# Patient Record
Sex: Female | Born: 1935 | Race: White | Hispanic: No | Smoking: Never smoker
Health system: Southern US, Community
[De-identification: ages and names within clinical notes are randomized; demographics above are authoritative.]

## PROBLEM LIST (undated history)

## (undated) ENCOUNTER — Emergency Department (HOSPITAL_COMMUNITY): Admission: EM

## (undated) DIAGNOSIS — F32A Depression, unspecified: Secondary | ICD-10-CM

## (undated) DIAGNOSIS — F329 Major depressive disorder, single episode, unspecified: Secondary | ICD-10-CM

## (undated) DIAGNOSIS — I428 Other cardiomyopathies: Secondary | ICD-10-CM

## (undated) DIAGNOSIS — J449 Chronic obstructive pulmonary disease, unspecified: Secondary | ICD-10-CM

## (undated) DIAGNOSIS — I10 Essential (primary) hypertension: Secondary | ICD-10-CM

## (undated) DIAGNOSIS — I4891 Unspecified atrial fibrillation: Secondary | ICD-10-CM

## (undated) DIAGNOSIS — C801 Malignant (primary) neoplasm, unspecified: Secondary | ICD-10-CM

## (undated) DIAGNOSIS — G459 Transient cerebral ischemic attack, unspecified: Secondary | ICD-10-CM

## (undated) DIAGNOSIS — F419 Anxiety disorder, unspecified: Secondary | ICD-10-CM

## (undated) DIAGNOSIS — K219 Gastro-esophageal reflux disease without esophagitis: Secondary | ICD-10-CM

## (undated) DIAGNOSIS — M1990 Unspecified osteoarthritis, unspecified site: Secondary | ICD-10-CM

## (undated) HISTORY — PX: TOTAL HIP ARTHROPLASTY: SHX124

## (undated) HISTORY — PX: MASTECTOMY: SHX3

## (undated) HISTORY — PX: BREAST BIOPSY: SHX20

## (undated) HISTORY — DX: Transient cerebral ischemic attack, unspecified: G45.9

## (undated) HISTORY — PX: APPENDECTOMY: SHX54

## (undated) HISTORY — PX: ABDOMINAL HYSTERECTOMY: SHX81

## (undated) HISTORY — PX: CHOLECYSTECTOMY: SHX55

## (undated) HISTORY — PX: ROTATOR CUFF REPAIR: SHX139

## (undated) HISTORY — PX: TOTAL KNEE ARTHROPLASTY: SHX125

## (undated) HISTORY — PX: BACK SURGERY: SHX140

## (undated) HISTORY — PX: CARDIAC CATHETERIZATION: SHX172

## (undated) HISTORY — DX: Other cardiomyopathies: I42.8

## (undated) HISTORY — DX: Unspecified atrial fibrillation: I48.91

---

## 2011-03-25 DIAGNOSIS — M6281 Muscle weakness (generalized): Secondary | ICD-10-CM | POA: Diagnosis not present

## 2011-03-25 DIAGNOSIS — M25519 Pain in unspecified shoulder: Secondary | ICD-10-CM | POA: Diagnosis not present

## 2011-03-25 DIAGNOSIS — M25619 Stiffness of unspecified shoulder, not elsewhere classified: Secondary | ICD-10-CM | POA: Diagnosis not present

## 2011-03-25 DIAGNOSIS — IMO0001 Reserved for inherently not codable concepts without codable children: Secondary | ICD-10-CM | POA: Diagnosis not present

## 2011-03-28 DIAGNOSIS — M6281 Muscle weakness (generalized): Secondary | ICD-10-CM | POA: Diagnosis not present

## 2011-03-28 DIAGNOSIS — M25619 Stiffness of unspecified shoulder, not elsewhere classified: Secondary | ICD-10-CM | POA: Diagnosis not present

## 2011-03-28 DIAGNOSIS — M25519 Pain in unspecified shoulder: Secondary | ICD-10-CM | POA: Diagnosis not present

## 2011-03-28 DIAGNOSIS — IMO0001 Reserved for inherently not codable concepts without codable children: Secondary | ICD-10-CM | POA: Diagnosis not present

## 2011-04-02 DIAGNOSIS — M25619 Stiffness of unspecified shoulder, not elsewhere classified: Secondary | ICD-10-CM | POA: Diagnosis not present

## 2011-04-02 DIAGNOSIS — IMO0001 Reserved for inherently not codable concepts without codable children: Secondary | ICD-10-CM | POA: Diagnosis not present

## 2011-04-02 DIAGNOSIS — M25519 Pain in unspecified shoulder: Secondary | ICD-10-CM | POA: Diagnosis not present

## 2011-04-02 DIAGNOSIS — M6281 Muscle weakness (generalized): Secondary | ICD-10-CM | POA: Diagnosis not present

## 2011-04-04 DIAGNOSIS — IMO0001 Reserved for inherently not codable concepts without codable children: Secondary | ICD-10-CM | POA: Diagnosis not present

## 2011-04-04 DIAGNOSIS — M25519 Pain in unspecified shoulder: Secondary | ICD-10-CM | POA: Diagnosis not present

## 2011-04-04 DIAGNOSIS — M6281 Muscle weakness (generalized): Secondary | ICD-10-CM | POA: Diagnosis not present

## 2011-04-04 DIAGNOSIS — M25619 Stiffness of unspecified shoulder, not elsewhere classified: Secondary | ICD-10-CM | POA: Diagnosis not present

## 2011-04-09 DIAGNOSIS — M6281 Muscle weakness (generalized): Secondary | ICD-10-CM | POA: Diagnosis not present

## 2011-04-09 DIAGNOSIS — M25619 Stiffness of unspecified shoulder, not elsewhere classified: Secondary | ICD-10-CM | POA: Diagnosis not present

## 2011-04-09 DIAGNOSIS — M25519 Pain in unspecified shoulder: Secondary | ICD-10-CM | POA: Diagnosis not present

## 2011-04-09 DIAGNOSIS — IMO0001 Reserved for inherently not codable concepts without codable children: Secondary | ICD-10-CM | POA: Diagnosis not present

## 2011-04-11 DIAGNOSIS — IMO0001 Reserved for inherently not codable concepts without codable children: Secondary | ICD-10-CM | POA: Diagnosis not present

## 2011-04-11 DIAGNOSIS — M6281 Muscle weakness (generalized): Secondary | ICD-10-CM | POA: Diagnosis not present

## 2011-04-11 DIAGNOSIS — M25619 Stiffness of unspecified shoulder, not elsewhere classified: Secondary | ICD-10-CM | POA: Diagnosis not present

## 2011-04-11 DIAGNOSIS — M25519 Pain in unspecified shoulder: Secondary | ICD-10-CM | POA: Diagnosis not present

## 2011-04-16 DIAGNOSIS — IMO0001 Reserved for inherently not codable concepts without codable children: Secondary | ICD-10-CM | POA: Diagnosis not present

## 2011-04-16 DIAGNOSIS — M25519 Pain in unspecified shoulder: Secondary | ICD-10-CM | POA: Diagnosis not present

## 2011-04-16 DIAGNOSIS — M25619 Stiffness of unspecified shoulder, not elsewhere classified: Secondary | ICD-10-CM | POA: Diagnosis not present

## 2011-04-16 DIAGNOSIS — M6281 Muscle weakness (generalized): Secondary | ICD-10-CM | POA: Diagnosis not present

## 2011-04-18 DIAGNOSIS — M25619 Stiffness of unspecified shoulder, not elsewhere classified: Secondary | ICD-10-CM | POA: Diagnosis not present

## 2011-04-18 DIAGNOSIS — M6281 Muscle weakness (generalized): Secondary | ICD-10-CM | POA: Diagnosis not present

## 2011-04-18 DIAGNOSIS — IMO0001 Reserved for inherently not codable concepts without codable children: Secondary | ICD-10-CM | POA: Diagnosis not present

## 2011-04-18 DIAGNOSIS — M25519 Pain in unspecified shoulder: Secondary | ICD-10-CM | POA: Diagnosis not present

## 2011-04-23 DIAGNOSIS — M25519 Pain in unspecified shoulder: Secondary | ICD-10-CM | POA: Diagnosis not present

## 2011-04-23 DIAGNOSIS — M25619 Stiffness of unspecified shoulder, not elsewhere classified: Secondary | ICD-10-CM | POA: Diagnosis not present

## 2011-04-23 DIAGNOSIS — IMO0001 Reserved for inherently not codable concepts without codable children: Secondary | ICD-10-CM | POA: Diagnosis not present

## 2011-04-23 DIAGNOSIS — M6281 Muscle weakness (generalized): Secondary | ICD-10-CM | POA: Diagnosis not present

## 2011-04-25 DIAGNOSIS — M25519 Pain in unspecified shoulder: Secondary | ICD-10-CM | POA: Diagnosis not present

## 2011-04-25 DIAGNOSIS — M6281 Muscle weakness (generalized): Secondary | ICD-10-CM | POA: Diagnosis not present

## 2011-04-25 DIAGNOSIS — M25619 Stiffness of unspecified shoulder, not elsewhere classified: Secondary | ICD-10-CM | POA: Diagnosis not present

## 2011-04-25 DIAGNOSIS — IMO0001 Reserved for inherently not codable concepts without codable children: Secondary | ICD-10-CM | POA: Diagnosis not present

## 2011-04-26 DIAGNOSIS — IMO0001 Reserved for inherently not codable concepts without codable children: Secondary | ICD-10-CM | POA: Diagnosis not present

## 2011-04-26 DIAGNOSIS — M6281 Muscle weakness (generalized): Secondary | ICD-10-CM | POA: Diagnosis not present

## 2011-04-26 DIAGNOSIS — M25619 Stiffness of unspecified shoulder, not elsewhere classified: Secondary | ICD-10-CM | POA: Diagnosis not present

## 2011-04-26 DIAGNOSIS — M25519 Pain in unspecified shoulder: Secondary | ICD-10-CM | POA: Diagnosis not present

## 2011-04-30 DIAGNOSIS — F329 Major depressive disorder, single episode, unspecified: Secondary | ICD-10-CM | POA: Diagnosis not present

## 2011-04-30 DIAGNOSIS — M6281 Muscle weakness (generalized): Secondary | ICD-10-CM | POA: Diagnosis not present

## 2011-04-30 DIAGNOSIS — M25519 Pain in unspecified shoulder: Secondary | ICD-10-CM | POA: Diagnosis not present

## 2011-04-30 DIAGNOSIS — IMO0001 Reserved for inherently not codable concepts without codable children: Secondary | ICD-10-CM | POA: Diagnosis not present

## 2011-04-30 DIAGNOSIS — M25619 Stiffness of unspecified shoulder, not elsewhere classified: Secondary | ICD-10-CM | POA: Diagnosis not present

## 2011-05-01 DIAGNOSIS — M25619 Stiffness of unspecified shoulder, not elsewhere classified: Secondary | ICD-10-CM | POA: Diagnosis not present

## 2011-05-01 DIAGNOSIS — IMO0001 Reserved for inherently not codable concepts without codable children: Secondary | ICD-10-CM | POA: Diagnosis not present

## 2011-05-01 DIAGNOSIS — M25519 Pain in unspecified shoulder: Secondary | ICD-10-CM | POA: Diagnosis not present

## 2011-05-01 DIAGNOSIS — M6281 Muscle weakness (generalized): Secondary | ICD-10-CM | POA: Diagnosis not present

## 2011-05-03 DIAGNOSIS — M25619 Stiffness of unspecified shoulder, not elsewhere classified: Secondary | ICD-10-CM | POA: Diagnosis not present

## 2011-05-03 DIAGNOSIS — M25519 Pain in unspecified shoulder: Secondary | ICD-10-CM | POA: Diagnosis not present

## 2011-05-03 DIAGNOSIS — IMO0001 Reserved for inherently not codable concepts without codable children: Secondary | ICD-10-CM | POA: Diagnosis not present

## 2011-05-03 DIAGNOSIS — M6281 Muscle weakness (generalized): Secondary | ICD-10-CM | POA: Diagnosis not present

## 2011-05-06 DIAGNOSIS — M25619 Stiffness of unspecified shoulder, not elsewhere classified: Secondary | ICD-10-CM | POA: Diagnosis not present

## 2011-05-06 DIAGNOSIS — IMO0001 Reserved for inherently not codable concepts without codable children: Secondary | ICD-10-CM | POA: Diagnosis not present

## 2011-05-06 DIAGNOSIS — M25519 Pain in unspecified shoulder: Secondary | ICD-10-CM | POA: Diagnosis not present

## 2011-05-06 DIAGNOSIS — M6281 Muscle weakness (generalized): Secondary | ICD-10-CM | POA: Diagnosis not present

## 2011-05-08 DIAGNOSIS — IMO0001 Reserved for inherently not codable concepts without codable children: Secondary | ICD-10-CM | POA: Diagnosis not present

## 2011-05-08 DIAGNOSIS — M6281 Muscle weakness (generalized): Secondary | ICD-10-CM | POA: Diagnosis not present

## 2011-05-08 DIAGNOSIS — M25519 Pain in unspecified shoulder: Secondary | ICD-10-CM | POA: Diagnosis not present

## 2011-05-08 DIAGNOSIS — M25619 Stiffness of unspecified shoulder, not elsewhere classified: Secondary | ICD-10-CM | POA: Diagnosis not present

## 2011-05-10 DIAGNOSIS — M25519 Pain in unspecified shoulder: Secondary | ICD-10-CM | POA: Diagnosis not present

## 2011-05-10 DIAGNOSIS — M6281 Muscle weakness (generalized): Secondary | ICD-10-CM | POA: Diagnosis not present

## 2011-05-10 DIAGNOSIS — M25619 Stiffness of unspecified shoulder, not elsewhere classified: Secondary | ICD-10-CM | POA: Diagnosis not present

## 2011-05-10 DIAGNOSIS — IMO0001 Reserved for inherently not codable concepts without codable children: Secondary | ICD-10-CM | POA: Diagnosis not present

## 2011-05-14 DIAGNOSIS — M6281 Muscle weakness (generalized): Secondary | ICD-10-CM | POA: Diagnosis not present

## 2011-05-14 DIAGNOSIS — M25619 Stiffness of unspecified shoulder, not elsewhere classified: Secondary | ICD-10-CM | POA: Diagnosis not present

## 2011-05-14 DIAGNOSIS — IMO0001 Reserved for inherently not codable concepts without codable children: Secondary | ICD-10-CM | POA: Diagnosis not present

## 2011-05-14 DIAGNOSIS — M25519 Pain in unspecified shoulder: Secondary | ICD-10-CM | POA: Diagnosis not present

## 2011-05-15 DIAGNOSIS — M6281 Muscle weakness (generalized): Secondary | ICD-10-CM | POA: Diagnosis not present

## 2011-05-15 DIAGNOSIS — IMO0001 Reserved for inherently not codable concepts without codable children: Secondary | ICD-10-CM | POA: Diagnosis not present

## 2011-05-15 DIAGNOSIS — M25619 Stiffness of unspecified shoulder, not elsewhere classified: Secondary | ICD-10-CM | POA: Diagnosis not present

## 2011-05-15 DIAGNOSIS — M25519 Pain in unspecified shoulder: Secondary | ICD-10-CM | POA: Diagnosis not present

## 2011-05-17 DIAGNOSIS — IMO0001 Reserved for inherently not codable concepts without codable children: Secondary | ICD-10-CM | POA: Diagnosis not present

## 2011-05-17 DIAGNOSIS — M25519 Pain in unspecified shoulder: Secondary | ICD-10-CM | POA: Diagnosis not present

## 2011-05-17 DIAGNOSIS — M25619 Stiffness of unspecified shoulder, not elsewhere classified: Secondary | ICD-10-CM | POA: Diagnosis not present

## 2011-05-17 DIAGNOSIS — M6281 Muscle weakness (generalized): Secondary | ICD-10-CM | POA: Diagnosis not present

## 2011-05-20 DIAGNOSIS — M25519 Pain in unspecified shoulder: Secondary | ICD-10-CM | POA: Diagnosis not present

## 2011-05-20 DIAGNOSIS — M25619 Stiffness of unspecified shoulder, not elsewhere classified: Secondary | ICD-10-CM | POA: Diagnosis not present

## 2011-05-20 DIAGNOSIS — M6281 Muscle weakness (generalized): Secondary | ICD-10-CM | POA: Diagnosis not present

## 2011-05-20 DIAGNOSIS — IMO0001 Reserved for inherently not codable concepts without codable children: Secondary | ICD-10-CM | POA: Diagnosis not present

## 2011-05-22 DIAGNOSIS — G459 Transient cerebral ischemic attack, unspecified: Secondary | ICD-10-CM | POA: Diagnosis not present

## 2011-05-22 DIAGNOSIS — Z79899 Other long term (current) drug therapy: Secondary | ICD-10-CM | POA: Diagnosis not present

## 2011-05-22 DIAGNOSIS — M25519 Pain in unspecified shoulder: Secondary | ICD-10-CM | POA: Diagnosis not present

## 2011-05-22 DIAGNOSIS — M6281 Muscle weakness (generalized): Secondary | ICD-10-CM | POA: Diagnosis not present

## 2011-05-22 DIAGNOSIS — M25619 Stiffness of unspecified shoulder, not elsewhere classified: Secondary | ICD-10-CM | POA: Diagnosis not present

## 2011-05-22 DIAGNOSIS — IMO0001 Reserved for inherently not codable concepts without codable children: Secondary | ICD-10-CM | POA: Diagnosis not present

## 2011-05-23 DIAGNOSIS — G459 Transient cerebral ischemic attack, unspecified: Secondary | ICD-10-CM | POA: Diagnosis not present

## 2011-05-27 DIAGNOSIS — R5381 Other malaise: Secondary | ICD-10-CM | POA: Diagnosis not present

## 2011-05-27 DIAGNOSIS — I10 Essential (primary) hypertension: Secondary | ICD-10-CM | POA: Diagnosis not present

## 2011-05-27 DIAGNOSIS — G459 Transient cerebral ischemic attack, unspecified: Secondary | ICD-10-CM | POA: Diagnosis not present

## 2011-05-27 DIAGNOSIS — R0989 Other specified symptoms and signs involving the circulatory and respiratory systems: Secondary | ICD-10-CM | POA: Diagnosis not present

## 2011-05-27 DIAGNOSIS — Z853 Personal history of malignant neoplasm of breast: Secondary | ICD-10-CM | POA: Diagnosis not present

## 2011-05-27 DIAGNOSIS — M159 Polyosteoarthritis, unspecified: Secondary | ICD-10-CM | POA: Diagnosis present

## 2011-05-27 DIAGNOSIS — R0609 Other forms of dyspnea: Secondary | ICD-10-CM | POA: Diagnosis not present

## 2011-05-27 DIAGNOSIS — K219 Gastro-esophageal reflux disease without esophagitis: Secondary | ICD-10-CM | POA: Diagnosis present

## 2011-05-27 DIAGNOSIS — E785 Hyperlipidemia, unspecified: Secondary | ICD-10-CM | POA: Diagnosis not present

## 2011-05-27 DIAGNOSIS — R55 Syncope and collapse: Secondary | ICD-10-CM | POA: Diagnosis not present

## 2011-05-27 DIAGNOSIS — R209 Unspecified disturbances of skin sensation: Secondary | ICD-10-CM | POA: Diagnosis not present

## 2011-05-27 DIAGNOSIS — R42 Dizziness and giddiness: Secondary | ICD-10-CM | POA: Diagnosis not present

## 2011-05-27 DIAGNOSIS — N390 Urinary tract infection, site not specified: Secondary | ICD-10-CM | POA: Diagnosis not present

## 2011-05-27 DIAGNOSIS — R5383 Other fatigue: Secondary | ICD-10-CM | POA: Diagnosis not present

## 2011-05-27 DIAGNOSIS — F341 Dysthymic disorder: Secondary | ICD-10-CM | POA: Diagnosis present

## 2011-05-27 DIAGNOSIS — I6789 Other cerebrovascular disease: Secondary | ICD-10-CM | POA: Diagnosis not present

## 2011-05-31 DIAGNOSIS — IMO0001 Reserved for inherently not codable concepts without codable children: Secondary | ICD-10-CM | POA: Diagnosis not present

## 2011-05-31 DIAGNOSIS — M6281 Muscle weakness (generalized): Secondary | ICD-10-CM | POA: Diagnosis not present

## 2011-05-31 DIAGNOSIS — M25519 Pain in unspecified shoulder: Secondary | ICD-10-CM | POA: Diagnosis not present

## 2011-05-31 DIAGNOSIS — I10 Essential (primary) hypertension: Secondary | ICD-10-CM | POA: Diagnosis not present

## 2011-05-31 DIAGNOSIS — M25619 Stiffness of unspecified shoulder, not elsewhere classified: Secondary | ICD-10-CM | POA: Diagnosis not present

## 2011-05-31 DIAGNOSIS — I679 Cerebrovascular disease, unspecified: Secondary | ICD-10-CM | POA: Diagnosis not present

## 2011-06-04 DIAGNOSIS — M25519 Pain in unspecified shoulder: Secondary | ICD-10-CM | POA: Diagnosis not present

## 2011-06-04 DIAGNOSIS — M25619 Stiffness of unspecified shoulder, not elsewhere classified: Secondary | ICD-10-CM | POA: Diagnosis not present

## 2011-06-04 DIAGNOSIS — M6281 Muscle weakness (generalized): Secondary | ICD-10-CM | POA: Diagnosis not present

## 2011-06-04 DIAGNOSIS — IMO0001 Reserved for inherently not codable concepts without codable children: Secondary | ICD-10-CM | POA: Diagnosis not present

## 2011-06-06 DIAGNOSIS — M25519 Pain in unspecified shoulder: Secondary | ICD-10-CM | POA: Diagnosis not present

## 2011-06-06 DIAGNOSIS — IMO0001 Reserved for inherently not codable concepts without codable children: Secondary | ICD-10-CM | POA: Diagnosis not present

## 2011-06-06 DIAGNOSIS — M6281 Muscle weakness (generalized): Secondary | ICD-10-CM | POA: Diagnosis not present

## 2011-06-06 DIAGNOSIS — M25619 Stiffness of unspecified shoulder, not elsewhere classified: Secondary | ICD-10-CM | POA: Diagnosis not present

## 2011-06-11 DIAGNOSIS — M6281 Muscle weakness (generalized): Secondary | ICD-10-CM | POA: Diagnosis not present

## 2011-06-11 DIAGNOSIS — M25619 Stiffness of unspecified shoulder, not elsewhere classified: Secondary | ICD-10-CM | POA: Diagnosis not present

## 2011-06-11 DIAGNOSIS — IMO0001 Reserved for inherently not codable concepts without codable children: Secondary | ICD-10-CM | POA: Diagnosis not present

## 2011-06-11 DIAGNOSIS — M25519 Pain in unspecified shoulder: Secondary | ICD-10-CM | POA: Diagnosis not present

## 2011-06-12 DIAGNOSIS — M6281 Muscle weakness (generalized): Secondary | ICD-10-CM | POA: Diagnosis not present

## 2011-06-12 DIAGNOSIS — M25619 Stiffness of unspecified shoulder, not elsewhere classified: Secondary | ICD-10-CM | POA: Diagnosis not present

## 2011-06-12 DIAGNOSIS — IMO0001 Reserved for inherently not codable concepts without codable children: Secondary | ICD-10-CM | POA: Diagnosis not present

## 2011-06-12 DIAGNOSIS — M25519 Pain in unspecified shoulder: Secondary | ICD-10-CM | POA: Diagnosis not present

## 2011-06-14 DIAGNOSIS — M25519 Pain in unspecified shoulder: Secondary | ICD-10-CM | POA: Diagnosis not present

## 2011-06-14 DIAGNOSIS — M6281 Muscle weakness (generalized): Secondary | ICD-10-CM | POA: Diagnosis not present

## 2011-06-14 DIAGNOSIS — M25619 Stiffness of unspecified shoulder, not elsewhere classified: Secondary | ICD-10-CM | POA: Diagnosis not present

## 2011-06-14 DIAGNOSIS — IMO0001 Reserved for inherently not codable concepts without codable children: Secondary | ICD-10-CM | POA: Diagnosis not present

## 2011-06-18 DIAGNOSIS — M6281 Muscle weakness (generalized): Secondary | ICD-10-CM | POA: Diagnosis not present

## 2011-06-18 DIAGNOSIS — IMO0001 Reserved for inherently not codable concepts without codable children: Secondary | ICD-10-CM | POA: Diagnosis not present

## 2011-06-18 DIAGNOSIS — M25519 Pain in unspecified shoulder: Secondary | ICD-10-CM | POA: Diagnosis not present

## 2011-06-18 DIAGNOSIS — M25619 Stiffness of unspecified shoulder, not elsewhere classified: Secondary | ICD-10-CM | POA: Diagnosis not present

## 2011-06-19 DIAGNOSIS — M25619 Stiffness of unspecified shoulder, not elsewhere classified: Secondary | ICD-10-CM | POA: Diagnosis not present

## 2011-06-19 DIAGNOSIS — IMO0001 Reserved for inherently not codable concepts without codable children: Secondary | ICD-10-CM | POA: Diagnosis not present

## 2011-06-19 DIAGNOSIS — M6281 Muscle weakness (generalized): Secondary | ICD-10-CM | POA: Diagnosis not present

## 2011-06-19 DIAGNOSIS — M25519 Pain in unspecified shoulder: Secondary | ICD-10-CM | POA: Diagnosis not present

## 2011-06-21 DIAGNOSIS — M6281 Muscle weakness (generalized): Secondary | ICD-10-CM | POA: Diagnosis not present

## 2011-06-21 DIAGNOSIS — M25519 Pain in unspecified shoulder: Secondary | ICD-10-CM | POA: Diagnosis not present

## 2011-06-21 DIAGNOSIS — M25619 Stiffness of unspecified shoulder, not elsewhere classified: Secondary | ICD-10-CM | POA: Diagnosis not present

## 2011-06-21 DIAGNOSIS — IMO0001 Reserved for inherently not codable concepts without codable children: Secondary | ICD-10-CM | POA: Diagnosis not present

## 2011-06-24 DIAGNOSIS — R079 Chest pain, unspecified: Secondary | ICD-10-CM | POA: Diagnosis not present

## 2011-06-24 DIAGNOSIS — R0602 Shortness of breath: Secondary | ICD-10-CM | POA: Diagnosis not present

## 2011-06-25 DIAGNOSIS — M25619 Stiffness of unspecified shoulder, not elsewhere classified: Secondary | ICD-10-CM | POA: Diagnosis not present

## 2011-06-25 DIAGNOSIS — M25519 Pain in unspecified shoulder: Secondary | ICD-10-CM | POA: Diagnosis not present

## 2011-06-25 DIAGNOSIS — IMO0001 Reserved for inherently not codable concepts without codable children: Secondary | ICD-10-CM | POA: Diagnosis not present

## 2011-06-25 DIAGNOSIS — M6281 Muscle weakness (generalized): Secondary | ICD-10-CM | POA: Diagnosis not present

## 2011-06-27 DIAGNOSIS — M6281 Muscle weakness (generalized): Secondary | ICD-10-CM | POA: Diagnosis not present

## 2011-06-27 DIAGNOSIS — M25619 Stiffness of unspecified shoulder, not elsewhere classified: Secondary | ICD-10-CM | POA: Diagnosis not present

## 2011-06-27 DIAGNOSIS — M25519 Pain in unspecified shoulder: Secondary | ICD-10-CM | POA: Diagnosis not present

## 2011-06-27 DIAGNOSIS — IMO0001 Reserved for inherently not codable concepts without codable children: Secondary | ICD-10-CM | POA: Diagnosis not present

## 2011-07-30 DIAGNOSIS — I10 Essential (primary) hypertension: Secondary | ICD-10-CM | POA: Diagnosis not present

## 2011-07-30 DIAGNOSIS — I679 Cerebrovascular disease, unspecified: Secondary | ICD-10-CM | POA: Diagnosis not present

## 2011-07-30 DIAGNOSIS — Z79899 Other long term (current) drug therapy: Secondary | ICD-10-CM | POA: Diagnosis not present

## 2011-08-01 DIAGNOSIS — I679 Cerebrovascular disease, unspecified: Secondary | ICD-10-CM

## 2011-08-01 DIAGNOSIS — K219 Gastro-esophageal reflux disease without esophagitis: Secondary | ICD-10-CM | POA: Insufficient documentation

## 2011-08-01 DIAGNOSIS — F411 Generalized anxiety disorder: Secondary | ICD-10-CM | POA: Insufficient documentation

## 2011-08-01 DIAGNOSIS — Z79899 Other long term (current) drug therapy: Secondary | ICD-10-CM | POA: Insufficient documentation

## 2011-08-01 DIAGNOSIS — Z853 Personal history of malignant neoplasm of breast: Secondary | ICD-10-CM | POA: Insufficient documentation

## 2011-08-01 DIAGNOSIS — B029 Zoster without complications: Secondary | ICD-10-CM | POA: Diagnosis not present

## 2011-08-01 DIAGNOSIS — K2990 Gastroduodenitis, unspecified, without bleeding: Secondary | ICD-10-CM | POA: Insufficient documentation

## 2011-08-01 DIAGNOSIS — E785 Hyperlipidemia, unspecified: Secondary | ICD-10-CM | POA: Insufficient documentation

## 2011-08-01 DIAGNOSIS — E782 Mixed hyperlipidemia: Secondary | ICD-10-CM | POA: Insufficient documentation

## 2011-08-01 DIAGNOSIS — I739 Peripheral vascular disease, unspecified: Secondary | ICD-10-CM

## 2011-08-01 DIAGNOSIS — E559 Vitamin D deficiency, unspecified: Secondary | ICD-10-CM | POA: Insufficient documentation

## 2011-08-01 DIAGNOSIS — I10 Essential (primary) hypertension: Secondary | ICD-10-CM | POA: Insufficient documentation

## 2011-08-01 DIAGNOSIS — F419 Anxiety disorder, unspecified: Secondary | ICD-10-CM | POA: Insufficient documentation

## 2011-08-01 DIAGNOSIS — F32A Depression, unspecified: Secondary | ICD-10-CM | POA: Insufficient documentation

## 2011-08-01 DIAGNOSIS — M5030 Other cervical disc degeneration, unspecified cervical region: Secondary | ICD-10-CM | POA: Insufficient documentation

## 2011-08-12 DIAGNOSIS — M25569 Pain in unspecified knee: Secondary | ICD-10-CM | POA: Diagnosis not present

## 2011-08-20 DIAGNOSIS — L259 Unspecified contact dermatitis, unspecified cause: Secondary | ICD-10-CM | POA: Diagnosis not present

## 2011-09-05 DIAGNOSIS — Z853 Personal history of malignant neoplasm of breast: Secondary | ICD-10-CM | POA: Diagnosis not present

## 2011-09-05 DIAGNOSIS — S61409A Unspecified open wound of unspecified hand, initial encounter: Secondary | ICD-10-CM | POA: Diagnosis not present

## 2011-09-05 DIAGNOSIS — Z23 Encounter for immunization: Secondary | ICD-10-CM | POA: Diagnosis not present

## 2011-09-05 DIAGNOSIS — Z7982 Long term (current) use of aspirin: Secondary | ICD-10-CM | POA: Diagnosis not present

## 2011-09-05 DIAGNOSIS — IMO0002 Reserved for concepts with insufficient information to code with codable children: Secondary | ICD-10-CM | POA: Diagnosis not present

## 2011-09-05 DIAGNOSIS — Z79899 Other long term (current) drug therapy: Secondary | ICD-10-CM | POA: Diagnosis not present

## 2011-09-18 DIAGNOSIS — J069 Acute upper respiratory infection, unspecified: Secondary | ICD-10-CM | POA: Diagnosis not present

## 2011-09-18 DIAGNOSIS — R1084 Generalized abdominal pain: Secondary | ICD-10-CM | POA: Diagnosis not present

## 2011-10-18 DIAGNOSIS — Z79899 Other long term (current) drug therapy: Secondary | ICD-10-CM | POA: Diagnosis not present

## 2011-10-18 DIAGNOSIS — Z0000 Encounter for general adult medical examination without abnormal findings: Secondary | ICD-10-CM | POA: Diagnosis not present

## 2011-10-18 DIAGNOSIS — R5381 Other malaise: Secondary | ICD-10-CM | POA: Diagnosis not present

## 2011-10-24 DIAGNOSIS — E2839 Other primary ovarian failure: Secondary | ICD-10-CM | POA: Diagnosis not present

## 2011-10-28 DIAGNOSIS — M25569 Pain in unspecified knee: Secondary | ICD-10-CM | POA: Diagnosis not present

## 2011-11-05 DIAGNOSIS — T148XXA Other injury of unspecified body region, initial encounter: Secondary | ICD-10-CM | POA: Diagnosis not present

## 2011-11-05 DIAGNOSIS — S8000XA Contusion of unspecified knee, initial encounter: Secondary | ICD-10-CM | POA: Diagnosis not present

## 2011-11-26 DIAGNOSIS — M25569 Pain in unspecified knee: Secondary | ICD-10-CM | POA: Diagnosis not present

## 2011-12-02 DIAGNOSIS — Z23 Encounter for immunization: Secondary | ICD-10-CM | POA: Diagnosis not present

## 2011-12-10 DIAGNOSIS — M810 Age-related osteoporosis without current pathological fracture: Secondary | ICD-10-CM | POA: Diagnosis not present

## 2011-12-17 DIAGNOSIS — S8000XA Contusion of unspecified knee, initial encounter: Secondary | ICD-10-CM | POA: Diagnosis not present

## 2011-12-18 DIAGNOSIS — M810 Age-related osteoporosis without current pathological fracture: Secondary | ICD-10-CM | POA: Diagnosis not present

## 2011-12-26 DIAGNOSIS — M810 Age-related osteoporosis without current pathological fracture: Secondary | ICD-10-CM | POA: Diagnosis not present

## 2011-12-31 DIAGNOSIS — S43429A Sprain of unspecified rotator cuff capsule, initial encounter: Secondary | ICD-10-CM | POA: Diagnosis not present

## 2012-01-10 DIAGNOSIS — Z1231 Encounter for screening mammogram for malignant neoplasm of breast: Secondary | ICD-10-CM | POA: Diagnosis not present

## 2012-01-20 DIAGNOSIS — F329 Major depressive disorder, single episode, unspecified: Secondary | ICD-10-CM | POA: Diagnosis not present

## 2012-03-06 DIAGNOSIS — I10 Essential (primary) hypertension: Secondary | ICD-10-CM | POA: Diagnosis not present

## 2012-03-06 DIAGNOSIS — G4700 Insomnia, unspecified: Secondary | ICD-10-CM | POA: Diagnosis not present

## 2012-03-06 DIAGNOSIS — R109 Unspecified abdominal pain: Secondary | ICD-10-CM | POA: Diagnosis not present

## 2012-05-28 DIAGNOSIS — K219 Gastro-esophageal reflux disease without esophagitis: Secondary | ICD-10-CM | POA: Diagnosis not present

## 2012-07-14 DIAGNOSIS — M5137 Other intervertebral disc degeneration, lumbosacral region: Secondary | ICD-10-CM | POA: Diagnosis not present

## 2012-07-30 DIAGNOSIS — G4700 Insomnia, unspecified: Secondary | ICD-10-CM | POA: Diagnosis not present

## 2012-07-30 DIAGNOSIS — E785 Hyperlipidemia, unspecified: Secondary | ICD-10-CM | POA: Diagnosis not present

## 2012-09-01 DIAGNOSIS — M47817 Spondylosis without myelopathy or radiculopathy, lumbosacral region: Secondary | ICD-10-CM | POA: Diagnosis not present

## 2012-09-01 DIAGNOSIS — M48061 Spinal stenosis, lumbar region without neurogenic claudication: Secondary | ICD-10-CM | POA: Diagnosis not present

## 2012-09-03 DIAGNOSIS — Z853 Personal history of malignant neoplasm of breast: Secondary | ICD-10-CM | POA: Diagnosis not present

## 2012-09-03 DIAGNOSIS — M47817 Spondylosis without myelopathy or radiculopathy, lumbosacral region: Secondary | ICD-10-CM | POA: Diagnosis not present

## 2012-09-03 DIAGNOSIS — M5126 Other intervertebral disc displacement, lumbar region: Secondary | ICD-10-CM | POA: Diagnosis not present

## 2012-09-03 DIAGNOSIS — M5137 Other intervertebral disc degeneration, lumbosacral region: Secondary | ICD-10-CM | POA: Diagnosis not present

## 2012-09-15 DIAGNOSIS — M47817 Spondylosis without myelopathy or radiculopathy, lumbosacral region: Secondary | ICD-10-CM | POA: Diagnosis not present

## 2012-09-17 DIAGNOSIS — M47817 Spondylosis without myelopathy or radiculopathy, lumbosacral region: Secondary | ICD-10-CM | POA: Diagnosis not present

## 2012-09-17 DIAGNOSIS — M545 Low back pain: Secondary | ICD-10-CM | POA: Diagnosis not present

## 2012-09-21 DIAGNOSIS — M545 Low back pain: Secondary | ICD-10-CM | POA: Diagnosis not present

## 2012-09-21 DIAGNOSIS — M47817 Spondylosis without myelopathy or radiculopathy, lumbosacral region: Secondary | ICD-10-CM | POA: Diagnosis not present

## 2012-09-23 DIAGNOSIS — M545 Low back pain: Secondary | ICD-10-CM | POA: Diagnosis not present

## 2012-09-23 DIAGNOSIS — M47817 Spondylosis without myelopathy or radiculopathy, lumbosacral region: Secondary | ICD-10-CM | POA: Diagnosis not present

## 2012-09-28 DIAGNOSIS — M47817 Spondylosis without myelopathy or radiculopathy, lumbosacral region: Secondary | ICD-10-CM | POA: Diagnosis not present

## 2012-09-28 DIAGNOSIS — M545 Low back pain: Secondary | ICD-10-CM | POA: Diagnosis not present

## 2012-09-30 DIAGNOSIS — M545 Low back pain: Secondary | ICD-10-CM | POA: Diagnosis not present

## 2012-09-30 DIAGNOSIS — M47817 Spondylosis without myelopathy or radiculopathy, lumbosacral region: Secondary | ICD-10-CM | POA: Diagnosis not present

## 2012-10-05 DIAGNOSIS — M47817 Spondylosis without myelopathy or radiculopathy, lumbosacral region: Secondary | ICD-10-CM | POA: Diagnosis not present

## 2012-10-05 DIAGNOSIS — M545 Low back pain: Secondary | ICD-10-CM | POA: Diagnosis not present

## 2012-10-07 DIAGNOSIS — M47817 Spondylosis without myelopathy or radiculopathy, lumbosacral region: Secondary | ICD-10-CM | POA: Diagnosis not present

## 2012-10-07 DIAGNOSIS — M545 Low back pain: Secondary | ICD-10-CM | POA: Diagnosis not present

## 2012-10-12 DIAGNOSIS — M545 Low back pain: Secondary | ICD-10-CM | POA: Diagnosis not present

## 2012-10-12 DIAGNOSIS — M47817 Spondylosis without myelopathy or radiculopathy, lumbosacral region: Secondary | ICD-10-CM | POA: Diagnosis not present

## 2012-10-13 DIAGNOSIS — M5137 Other intervertebral disc degeneration, lumbosacral region: Secondary | ICD-10-CM | POA: Diagnosis not present

## 2012-10-21 DIAGNOSIS — E7800 Pure hypercholesterolemia, unspecified: Secondary | ICD-10-CM | POA: Diagnosis not present

## 2012-10-21 DIAGNOSIS — Z1212 Encounter for screening for malignant neoplasm of rectum: Secondary | ICD-10-CM | POA: Diagnosis not present

## 2012-10-21 DIAGNOSIS — Z0000 Encounter for general adult medical examination without abnormal findings: Secondary | ICD-10-CM | POA: Diagnosis not present

## 2012-10-21 DIAGNOSIS — R5381 Other malaise: Secondary | ICD-10-CM | POA: Diagnosis not present

## 2012-10-21 DIAGNOSIS — Z79899 Other long term (current) drug therapy: Secondary | ICD-10-CM | POA: Diagnosis not present

## 2012-10-27 DIAGNOSIS — M5137 Other intervertebral disc degeneration, lumbosacral region: Secondary | ICD-10-CM | POA: Diagnosis not present

## 2012-12-15 DIAGNOSIS — M47817 Spondylosis without myelopathy or radiculopathy, lumbosacral region: Secondary | ICD-10-CM | POA: Diagnosis not present

## 2012-12-25 DIAGNOSIS — Z23 Encounter for immunization: Secondary | ICD-10-CM | POA: Diagnosis not present

## 2013-01-13 DIAGNOSIS — Z9010 Acquired absence of unspecified breast and nipple: Secondary | ICD-10-CM | POA: Diagnosis not present

## 2013-01-13 DIAGNOSIS — Z1231 Encounter for screening mammogram for malignant neoplasm of breast: Secondary | ICD-10-CM | POA: Diagnosis not present

## 2013-01-19 DIAGNOSIS — L259 Unspecified contact dermatitis, unspecified cause: Secondary | ICD-10-CM | POA: Diagnosis not present

## 2013-01-19 DIAGNOSIS — H6690 Otitis media, unspecified, unspecified ear: Secondary | ICD-10-CM | POA: Diagnosis not present

## 2013-01-26 DIAGNOSIS — H6690 Otitis media, unspecified, unspecified ear: Secondary | ICD-10-CM | POA: Diagnosis not present

## 2013-01-26 DIAGNOSIS — L259 Unspecified contact dermatitis, unspecified cause: Secondary | ICD-10-CM | POA: Diagnosis not present

## 2013-02-16 DIAGNOSIS — H6120 Impacted cerumen, unspecified ear: Secondary | ICD-10-CM | POA: Diagnosis not present

## 2013-02-16 DIAGNOSIS — H9190 Unspecified hearing loss, unspecified ear: Secondary | ICD-10-CM | POA: Diagnosis not present

## 2013-04-01 DIAGNOSIS — R079 Chest pain, unspecified: Secondary | ICD-10-CM | POA: Diagnosis not present

## 2013-04-05 ENCOUNTER — Encounter: Payer: Self-pay | Admitting: Cardiovascular Disease

## 2013-04-05 DIAGNOSIS — I059 Rheumatic mitral valve disease, unspecified: Secondary | ICD-10-CM | POA: Diagnosis not present

## 2013-04-12 DIAGNOSIS — M25552 Pain in left hip: Secondary | ICD-10-CM | POA: Insufficient documentation

## 2013-04-12 DIAGNOSIS — R079 Chest pain, unspecified: Secondary | ICD-10-CM | POA: Insufficient documentation

## 2013-04-12 DIAGNOSIS — R0602 Shortness of breath: Secondary | ICD-10-CM | POA: Diagnosis not present

## 2013-04-13 DIAGNOSIS — M5137 Other intervertebral disc degeneration, lumbosacral region: Secondary | ICD-10-CM | POA: Diagnosis not present

## 2013-04-13 DIAGNOSIS — M5430 Sciatica, unspecified side: Secondary | ICD-10-CM | POA: Diagnosis not present

## 2013-04-14 DIAGNOSIS — R0602 Shortness of breath: Secondary | ICD-10-CM | POA: Diagnosis not present

## 2013-04-14 DIAGNOSIS — R079 Chest pain, unspecified: Secondary | ICD-10-CM | POA: Diagnosis not present

## 2013-05-04 DIAGNOSIS — J40 Bronchitis, not specified as acute or chronic: Secondary | ICD-10-CM | POA: Diagnosis not present

## 2013-05-04 DIAGNOSIS — R059 Cough, unspecified: Secondary | ICD-10-CM | POA: Diagnosis not present

## 2013-05-04 DIAGNOSIS — R0989 Other specified symptoms and signs involving the circulatory and respiratory systems: Secondary | ICD-10-CM | POA: Diagnosis not present

## 2013-05-04 DIAGNOSIS — R0609 Other forms of dyspnea: Secondary | ICD-10-CM | POA: Diagnosis not present

## 2013-05-04 DIAGNOSIS — J069 Acute upper respiratory infection, unspecified: Secondary | ICD-10-CM | POA: Diagnosis not present

## 2013-05-04 DIAGNOSIS — R0602 Shortness of breath: Secondary | ICD-10-CM | POA: Diagnosis not present

## 2013-05-17 DIAGNOSIS — I10 Essential (primary) hypertension: Secondary | ICD-10-CM | POA: Diagnosis not present

## 2013-05-17 DIAGNOSIS — Z880 Allergy status to penicillin: Secondary | ICD-10-CM | POA: Diagnosis not present

## 2013-05-17 DIAGNOSIS — Z79899 Other long term (current) drug therapy: Secondary | ICD-10-CM | POA: Diagnosis not present

## 2013-05-17 DIAGNOSIS — F3289 Other specified depressive episodes: Secondary | ICD-10-CM | POA: Diagnosis not present

## 2013-05-17 DIAGNOSIS — I059 Rheumatic mitral valve disease, unspecified: Secondary | ICD-10-CM | POA: Diagnosis not present

## 2013-05-17 DIAGNOSIS — Z87891 Personal history of nicotine dependence: Secondary | ICD-10-CM | POA: Diagnosis not present

## 2013-05-17 DIAGNOSIS — R0989 Other specified symptoms and signs involving the circulatory and respiratory systems: Secondary | ICD-10-CM | POA: Diagnosis not present

## 2013-05-17 DIAGNOSIS — F411 Generalized anxiety disorder: Secondary | ICD-10-CM | POA: Diagnosis not present

## 2013-05-17 DIAGNOSIS — R9431 Abnormal electrocardiogram [ECG] [EKG]: Secondary | ICD-10-CM | POA: Diagnosis not present

## 2013-05-17 DIAGNOSIS — R05 Cough: Secondary | ICD-10-CM | POA: Diagnosis not present

## 2013-05-17 DIAGNOSIS — R0609 Other forms of dyspnea: Secondary | ICD-10-CM | POA: Diagnosis not present

## 2013-05-17 DIAGNOSIS — R059 Cough, unspecified: Secondary | ICD-10-CM | POA: Diagnosis not present

## 2013-05-17 DIAGNOSIS — E785 Hyperlipidemia, unspecified: Secondary | ICD-10-CM | POA: Diagnosis not present

## 2013-05-17 DIAGNOSIS — R079 Chest pain, unspecified: Secondary | ICD-10-CM | POA: Diagnosis not present

## 2013-05-17 DIAGNOSIS — E876 Hypokalemia: Secondary | ICD-10-CM | POA: Diagnosis not present

## 2013-05-17 DIAGNOSIS — Z7982 Long term (current) use of aspirin: Secondary | ICD-10-CM | POA: Diagnosis not present

## 2013-05-17 DIAGNOSIS — Z853 Personal history of malignant neoplasm of breast: Secondary | ICD-10-CM | POA: Diagnosis not present

## 2013-05-17 DIAGNOSIS — R5381 Other malaise: Secondary | ICD-10-CM | POA: Diagnosis not present

## 2013-05-17 DIAGNOSIS — R0602 Shortness of breath: Secondary | ICD-10-CM | POA: Diagnosis not present

## 2013-05-17 DIAGNOSIS — F329 Major depressive disorder, single episode, unspecified: Secondary | ICD-10-CM | POA: Diagnosis not present

## 2013-05-17 DIAGNOSIS — Z883 Allergy status to other anti-infective agents status: Secondary | ICD-10-CM | POA: Diagnosis not present

## 2013-05-17 DIAGNOSIS — Z8249 Family history of ischemic heart disease and other diseases of the circulatory system: Secondary | ICD-10-CM | POA: Diagnosis not present

## 2013-05-18 DIAGNOSIS — I10 Essential (primary) hypertension: Secondary | ICD-10-CM | POA: Diagnosis not present

## 2013-05-25 DIAGNOSIS — Z79899 Other long term (current) drug therapy: Secondary | ICD-10-CM | POA: Diagnosis not present

## 2013-05-25 DIAGNOSIS — M810 Age-related osteoporosis without current pathological fracture: Secondary | ICD-10-CM | POA: Diagnosis not present

## 2013-05-25 DIAGNOSIS — R5383 Other fatigue: Secondary | ICD-10-CM | POA: Diagnosis not present

## 2013-05-25 DIAGNOSIS — R259 Unspecified abnormal involuntary movements: Secondary | ICD-10-CM | POA: Diagnosis not present

## 2013-05-25 DIAGNOSIS — R5381 Other malaise: Secondary | ICD-10-CM | POA: Diagnosis not present

## 2013-05-31 DIAGNOSIS — R079 Chest pain, unspecified: Secondary | ICD-10-CM | POA: Diagnosis not present

## 2013-05-31 DIAGNOSIS — R002 Palpitations: Secondary | ICD-10-CM | POA: Diagnosis not present

## 2013-06-04 DIAGNOSIS — K3189 Other diseases of stomach and duodenum: Secondary | ICD-10-CM | POA: Diagnosis not present

## 2013-06-04 DIAGNOSIS — R1013 Epigastric pain: Secondary | ICD-10-CM | POA: Diagnosis not present

## 2013-06-09 HISTORY — PX: ESOPHAGOGASTRODUODENOSCOPY: SHX1529

## 2013-06-18 DIAGNOSIS — Z823 Family history of stroke: Secondary | ICD-10-CM | POA: Diagnosis not present

## 2013-06-18 DIAGNOSIS — F411 Generalized anxiety disorder: Secondary | ICD-10-CM | POA: Diagnosis not present

## 2013-06-18 DIAGNOSIS — R1013 Epigastric pain: Secondary | ICD-10-CM | POA: Diagnosis not present

## 2013-06-18 DIAGNOSIS — E785 Hyperlipidemia, unspecified: Secondary | ICD-10-CM | POA: Diagnosis not present

## 2013-06-18 DIAGNOSIS — Z79899 Other long term (current) drug therapy: Secondary | ICD-10-CM | POA: Diagnosis not present

## 2013-06-18 DIAGNOSIS — K449 Diaphragmatic hernia without obstruction or gangrene: Secondary | ICD-10-CM | POA: Diagnosis not present

## 2013-06-18 DIAGNOSIS — Z885 Allergy status to narcotic agent status: Secondary | ICD-10-CM | POA: Diagnosis not present

## 2013-06-18 DIAGNOSIS — K219 Gastro-esophageal reflux disease without esophagitis: Secondary | ICD-10-CM | POA: Diagnosis not present

## 2013-06-18 DIAGNOSIS — Z803 Family history of malignant neoplasm of breast: Secondary | ICD-10-CM | POA: Diagnosis not present

## 2013-06-18 DIAGNOSIS — Q393 Congenital stenosis and stricture of esophagus: Secondary | ICD-10-CM | POA: Diagnosis not present

## 2013-06-18 DIAGNOSIS — Q391 Atresia of esophagus with tracheo-esophageal fistula: Secondary | ICD-10-CM | POA: Diagnosis not present

## 2013-06-18 DIAGNOSIS — I10 Essential (primary) hypertension: Secondary | ICD-10-CM | POA: Diagnosis not present

## 2013-06-18 DIAGNOSIS — Z9010 Acquired absence of unspecified breast and nipple: Secondary | ICD-10-CM | POA: Diagnosis not present

## 2013-06-18 DIAGNOSIS — R1310 Dysphagia, unspecified: Secondary | ICD-10-CM | POA: Diagnosis not present

## 2013-06-18 DIAGNOSIS — Z8489 Family history of other specified conditions: Secondary | ICD-10-CM | POA: Diagnosis not present

## 2013-06-18 DIAGNOSIS — Z880 Allergy status to penicillin: Secondary | ICD-10-CM | POA: Diagnosis not present

## 2013-06-18 DIAGNOSIS — F3289 Other specified depressive episodes: Secondary | ICD-10-CM | POA: Diagnosis not present

## 2013-06-18 DIAGNOSIS — Z96649 Presence of unspecified artificial hip joint: Secondary | ICD-10-CM | POA: Diagnosis not present

## 2013-06-18 DIAGNOSIS — F329 Major depressive disorder, single episode, unspecified: Secondary | ICD-10-CM | POA: Diagnosis not present

## 2013-06-18 DIAGNOSIS — K3189 Other diseases of stomach and duodenum: Secondary | ICD-10-CM | POA: Diagnosis not present

## 2013-06-18 DIAGNOSIS — Z806 Family history of leukemia: Secondary | ICD-10-CM | POA: Diagnosis not present

## 2013-06-18 DIAGNOSIS — Z882 Allergy status to sulfonamides status: Secondary | ICD-10-CM | POA: Diagnosis not present

## 2013-06-18 DIAGNOSIS — Z96659 Presence of unspecified artificial knee joint: Secondary | ICD-10-CM | POA: Diagnosis not present

## 2013-06-18 DIAGNOSIS — Z853 Personal history of malignant neoplasm of breast: Secondary | ICD-10-CM | POA: Diagnosis not present

## 2013-06-18 DIAGNOSIS — Z7982 Long term (current) use of aspirin: Secondary | ICD-10-CM | POA: Diagnosis not present

## 2013-06-28 DIAGNOSIS — M549 Dorsalgia, unspecified: Secondary | ICD-10-CM | POA: Diagnosis not present

## 2013-06-28 DIAGNOSIS — K219 Gastro-esophageal reflux disease without esophagitis: Secondary | ICD-10-CM | POA: Diagnosis not present

## 2013-06-28 DIAGNOSIS — E782 Mixed hyperlipidemia: Secondary | ICD-10-CM | POA: Diagnosis not present

## 2013-07-27 DIAGNOSIS — M47812 Spondylosis without myelopathy or radiculopathy, cervical region: Secondary | ICD-10-CM | POA: Diagnosis not present

## 2013-08-17 DIAGNOSIS — M47812 Spondylosis without myelopathy or radiculopathy, cervical region: Secondary | ICD-10-CM | POA: Diagnosis not present

## 2013-09-24 DIAGNOSIS — I10 Essential (primary) hypertension: Secondary | ICD-10-CM | POA: Diagnosis not present

## 2013-09-28 DIAGNOSIS — I10 Essential (primary) hypertension: Secondary | ICD-10-CM | POA: Diagnosis not present

## 2013-09-28 DIAGNOSIS — R0602 Shortness of breath: Secondary | ICD-10-CM | POA: Diagnosis not present

## 2013-09-28 DIAGNOSIS — E782 Mixed hyperlipidemia: Secondary | ICD-10-CM | POA: Diagnosis not present

## 2013-09-28 DIAGNOSIS — K219 Gastro-esophageal reflux disease without esophagitis: Secondary | ICD-10-CM | POA: Diagnosis not present

## 2013-10-27 DIAGNOSIS — L0100 Impetigo, unspecified: Secondary | ICD-10-CM | POA: Diagnosis not present

## 2013-10-27 DIAGNOSIS — L219 Seborrheic dermatitis, unspecified: Secondary | ICD-10-CM | POA: Diagnosis not present

## 2013-11-08 DIAGNOSIS — L600 Ingrowing nail: Secondary | ICD-10-CM | POA: Diagnosis not present

## 2013-11-08 DIAGNOSIS — M79609 Pain in unspecified limb: Secondary | ICD-10-CM | POA: Diagnosis not present

## 2013-11-08 DIAGNOSIS — B351 Tinea unguium: Secondary | ICD-10-CM | POA: Diagnosis not present

## 2013-11-09 ENCOUNTER — Other Ambulatory Visit: Payer: Self-pay

## 2013-11-09 DIAGNOSIS — R0602 Shortness of breath: Secondary | ICD-10-CM

## 2013-11-09 LAB — PULMONARY FUNCTION TEST
FEF 25-75 Post: 0.63 L/sec
FEF 25-75 Pre: 0.72 L/sec
FEF2575-%CHANGE-POST: -13 %
FEF2575-%PRED-PRE: 47 %
FEF2575-%Pred-Post: 41 %
FEV1-%Change-Post: -2 %
FEV1-%Pred-Post: 53 %
FEV1-%Pred-Pre: 54 %
FEV1-POST: 1.07 L
FEV1-Pre: 1.1 L
FEV1FVC-%CHANGE-POST: -6 %
FEV1FVC-%Pred-Pre: 91 %
FEV6-%CHANGE-POST: 3 %
FEV6-%PRED-POST: 65 %
FEV6-%PRED-PRE: 63 %
FEV6-PRE: 1.62 L
FEV6-Post: 1.68 L
FEV6FVC-%Change-Post: 0 %
FEV6FVC-%PRED-PRE: 105 %
FEV6FVC-%Pred-Post: 104 %
FVC-%Change-Post: 4 %
FVC-%PRED-POST: 62 %
FVC-%Pred-Pre: 60 %
FVC-POST: 1.69 L
FVC-Pre: 1.62 L
POST FEV1/FVC RATIO: 63 %
POST FEV6/FVC RATIO: 99 %
Pre FEV1/FVC ratio: 68 %
Pre FEV6/FVC Ratio: 100 %
RV % pred: 161 %
RV: 3.8 L
TLC % PRED: 102 %
TLC: 5.17 L

## 2013-11-11 ENCOUNTER — Encounter (HOSPITAL_COMMUNITY): Payer: Self-pay

## 2013-11-19 ENCOUNTER — Encounter (HOSPITAL_COMMUNITY): Payer: No Typology Code available for payment source

## 2013-11-22 ENCOUNTER — Ambulatory Visit (HOSPITAL_COMMUNITY)
Admission: RE | Admit: 2013-11-22 | Discharge: 2013-11-22 | Disposition: A | Discharge status: Home / Self Care | Payer: Medicare Other | Source: Ambulatory Visit | Attending: Internal Medicine | Admitting: Internal Medicine

## 2013-11-22 DIAGNOSIS — R0602 Shortness of breath: Secondary | ICD-10-CM | POA: Insufficient documentation

## 2013-11-22 MED ORDER — ALBUTEROL SULFATE (2.5 MG/3ML) 0.083% IN NEBU
2.5000 mg | INHALATION_SOLUTION | Freq: Once | RESPIRATORY_TRACT | Status: AC
  Administered 2013-11-22: 2.5 mg via RESPIRATORY_TRACT

## 2013-12-08 DIAGNOSIS — L219 Seborrheic dermatitis, unspecified: Secondary | ICD-10-CM | POA: Diagnosis not present

## 2013-12-26 DIAGNOSIS — Z23 Encounter for immunization: Secondary | ICD-10-CM | POA: Diagnosis not present

## 2013-12-27 DIAGNOSIS — Z23 Encounter for immunization: Secondary | ICD-10-CM | POA: Diagnosis not present

## 2013-12-29 DIAGNOSIS — J449 Chronic obstructive pulmonary disease, unspecified: Secondary | ICD-10-CM | POA: Diagnosis not present

## 2014-01-05 DIAGNOSIS — J449 Chronic obstructive pulmonary disease, unspecified: Secondary | ICD-10-CM | POA: Diagnosis not present

## 2014-01-19 DIAGNOSIS — J449 Chronic obstructive pulmonary disease, unspecified: Secondary | ICD-10-CM | POA: Diagnosis not present

## 2014-01-27 ENCOUNTER — Encounter (HOSPITAL_COMMUNITY)
Admission: RE | Admit: 2014-01-27 | Discharge: 2014-01-27 | Disposition: A | Discharge status: Home / Self Care | Payer: Medicare Other | Source: Ambulatory Visit | Attending: Internal Medicine | Admitting: Internal Medicine

## 2014-01-27 ENCOUNTER — Encounter (HOSPITAL_COMMUNITY): Payer: Self-pay

## 2014-01-27 VITALS — BP 112/58 | HR 80 | Ht 64.0 in | Wt 137.0 lb

## 2014-01-27 DIAGNOSIS — Z5189 Encounter for other specified aftercare: Secondary | ICD-10-CM | POA: Insufficient documentation

## 2014-01-27 DIAGNOSIS — J449 Chronic obstructive pulmonary disease, unspecified: Secondary | ICD-10-CM | POA: Insufficient documentation

## 2014-01-27 DIAGNOSIS — J441 Chronic obstructive pulmonary disease with (acute) exacerbation: Secondary | ICD-10-CM

## 2014-01-27 HISTORY — DX: Essential (primary) hypertension: I10

## 2014-01-27 HISTORY — DX: Malignant (primary) neoplasm, unspecified: C80.1

## 2014-01-27 NOTE — Progress Notes (Signed)
Patient referred to Pulmonary Rehab by Dr. Delphina Cahill due to COPD J 44.1.  Dr. [name] is her  PCP.  During orientation advised patient on arrival and appointment times what to wear, what to do before, during and after exercise.  Reviewed attendance and class policy.  Talked about inclement weather and class consultation policy. Patient is scheduled to start Pulmonary Rehab on Monday 01/31/14 at 2:30pm. Patient was advised to come to class 5 minutes before class starts.  He was also given instructions on meeting with the dietician and attending the Family Structure classes. Pt is eager to get started. Patient completed 5 min of 6 min walk test due to increased sob. Discussed pursed lip breathing.  Handout given to demonstrate technique.  Pt verbalized understanding.

## 2014-01-27 NOTE — Patient Instructions (Signed)
Pt has finished orientation and is scheduled to start PR on __Monday 11/23/15______ at __230pm_____. Pt has been instructed to arrive to class 15 minutes early for scheduled class. Pt has been instructed to wear comfortable clothing and shoes with rubber soles. Pt has been told to take their medications 1 hour prior to coming to class.  If the patient is not going to attend class, he/she has been instructed to call.

## 2014-01-28 NOTE — Psychosocial Assessment (Signed)
Amanda Palmer 80 y.o. female  Initial Psychosocial Assessment  Pt psychosocial assessment reveals pt lives alone. Pt is currently retired. Pt hobbies include reading. Pt reports her stress level is absent or low.  Pt does not exhibit signs of depression, although pt reports taking antidepressant medication . Pt shows fair  coping skills with positive outlook . Staff offered emotional support and reassurance. Monitor and evaluate progress toward psychosocial goal(s).  Goal(s): Improved coping skills Help patient work toward returning to meaningful activities that improve patient's QOL and are attainable with patient's lung disease Breath better  Feel better   01/28/2014 3:12 PM

## 2014-01-31 ENCOUNTER — Encounter (HOSPITAL_COMMUNITY)
Admission: RE | Admit: 2014-01-31 | Discharge: 2014-01-31 | Disposition: A | Discharge status: Home / Self Care | Payer: Medicare Other | Source: Ambulatory Visit | Attending: Internal Medicine | Admitting: Internal Medicine

## 2014-01-31 DIAGNOSIS — J449 Chronic obstructive pulmonary disease, unspecified: Secondary | ICD-10-CM | POA: Diagnosis not present

## 2014-01-31 DIAGNOSIS — Z5189 Encounter for other specified aftercare: Secondary | ICD-10-CM | POA: Diagnosis not present

## 2014-01-31 NOTE — Progress Notes (Addendum)
[facility] Pulmonary Rehabilitation Baseline Outcomes Assessment   Anthropometrics:  . Height (inches): 64 . Weight (kg): 62.1 . Grip strength was measured using a Dynamometer.  The patient's highest score was a 45.  Functional Status/Exercise Capacity: . Amanda Palmer had a resting heart rate of 80 BPM, a resting blood pressure of 112/58, and an oxygen saturation of 95 % on 0 liters of O2.  Amanda Palmer performed a 6-minute walk test on 01/27/2014.  The patient completed 600 feet in 6 minutes with 1 rest breaks.  This quantifies 1.9 METS.   Dyspnea Measures: . The [facility] is a simple and standardized method of classifying disability in patients with COPD.  The assessment correlates disability and dyspnea.  At entrance the patient scored a 3. The scale is provided below.   0= I only get breathless with strenuous exercise. 1= I get short of breath when hurrying on level ground or walking up a slight incline. 2= On level ground, I walk slower than people of the same age because of breathlessness, or have to stop for breath when walking at my own pace. 3= I stop for breath after walking 100 yards or after a few minutes on level ground. 4=I am too breathless to leave the house or I am breathless when dressing.   . The patient completed the [place] ([employer] [place]).  This questionnaire relates activities of daily living and shortness of breath.  The score ranges from 0-120, a higher score relates to severe shortness of breath during activities of daily living. The patient's score at entrance was 43.  Quality of Life: . Ferrans and Powers Quality of Life Index Pulmonary Version is used to assess the patients satisfaction in different domains of their life; health and functioning, socioeconomic, psychological/spiritual, and family. The overall score is recorded out of 30 points.  The patient's goal is to achieve an overall score of 21 or higher.   Amanda Palmer received a 24.93 at entrance.  . The Patient Health Questionnaire (PHQ-2) is a first step approach for the screening of depression.  If the patient scores positive on the PHQ-2 the patient should be further assessed with the PHQ-9.  The Patient Health Questionnaire (PHQ-9) assesses the degree of depression.  Depression is important to monitor and track in pulmonary patients due to its prevalence in the population.  If the patient advances to the PHQ-9 the goal is to score less than 4 on this assessment.  Amanda Palmer scored a 0 at entrance.  Clinical Assessment Tools: . The COPD Assessment Test (CAT) is a measurement tool to quantify how much of an impact the disease has on the patient's life.  This assessment aids the Pulmonary Rehab Team in designing the patients individualized treatment plan.  A CAT score ranges from 0-40.  A score of 10 or below indicates that COPD has a low impact on the patient's life whereas a score of 30 or higher indicates a severe impact. The patient's goal is a decrease of 1 point from entrance to discharge.  Amanda Palmer had a CAT score of 22 at entrance.  Nutrition: . The "Rate My Plate" is a dietary assessment that quantifies the balance of a patient's diet.  This tool allows the Pulmonary Rehab Team to key in on the areas of the patient's diet that needs improving.  The team can then focus their nutritional education on those areas.  If the patient scores 24-40, this means  there are many ways they can make their eating habits healthier, 41-57 states that there are some ways they can make their eating habits healthier and a score of 58-72 states that they are making many healthy choices.  The patient's goal is to achieve a score of 49 or higher on this assessment.  Amanda Palmer scored a 73 at entrance.  Oxygen Compliance: . Patient is currently on 0 liters at rest, 2 liters at night, and 0 liters for exercise.  Amanda Palmer is currently not using a cpap/bipap at night.The  patient states that they do not have barriers that keep them from using their oxygen.   Education: . Amanda Palmer will attend education classes during the course of Pulmonary Rehab.  Education classes that will be offered to the patient are Activities of Daily Living and Energy Conservation, Pursed Lip Breathing and Diaphragmatic Breathing, Nutrition, Exercise for the Pulmonary Patient, Warning Signs of Infection, Chronic Lung Disease, Advanced Directives, Medications, and Stress and Meditation.  The patient completed an assessment at the entrance of the program and will complete it again upon discharge to demonstrate the level of understanding provided by the educational classes.  This assessment includes 14 questions regarding all of the education topics above.  Amanda Palmer achieved a score of 8/14 at entrance.  Smoking Cessation: Patient currently not smoking.  Exercise:  Amanda Palmer will be provided with an individualized Home Exercise Prescription (HEP) at the entrance of the program.  The patient will be followed by the Pulmonary Exercise Physiologist throughout the program to assist with the progression of the frequency, intensity, time, and type of exercise. The patient's long-term goal is to be exercising 30-60 minutes, 3-5 days per week. At entrance, the patient was exercising 1-2 days at home.     Dr. Jamesetta So Koneswaran______________ Date_________ Time __________ Medical Director

## 2014-02-01 DIAGNOSIS — Z9011 Acquired absence of right breast and nipple: Secondary | ICD-10-CM | POA: Diagnosis not present

## 2014-02-01 DIAGNOSIS — Z1231 Encounter for screening mammogram for malignant neoplasm of breast: Secondary | ICD-10-CM | POA: Diagnosis not present

## 2014-02-02 ENCOUNTER — Encounter (HOSPITAL_COMMUNITY)
Admission: RE | Admit: 2014-02-02 | Discharge: 2014-02-02 | Disposition: A | Discharge status: Home / Self Care | Payer: Medicare Other | Source: Ambulatory Visit | Attending: Internal Medicine | Admitting: Internal Medicine

## 2014-02-02 DIAGNOSIS — Z5189 Encounter for other specified aftercare: Secondary | ICD-10-CM | POA: Diagnosis not present

## 2014-02-07 ENCOUNTER — Encounter (HOSPITAL_COMMUNITY): Payer: Medicare Other

## 2014-02-07 DIAGNOSIS — H903 Sensorineural hearing loss, bilateral: Secondary | ICD-10-CM | POA: Diagnosis not present

## 2014-02-07 DIAGNOSIS — S0991XA Unspecified injury of ear, initial encounter: Secondary | ICD-10-CM | POA: Diagnosis not present

## 2014-02-09 ENCOUNTER — Encounter (HOSPITAL_COMMUNITY)
Admission: RE | Admit: 2014-02-09 | Discharge: 2014-02-09 | Disposition: A | Discharge status: Home / Self Care | Payer: Medicare Other | Source: Ambulatory Visit | Attending: Internal Medicine | Admitting: Internal Medicine

## 2014-02-09 DIAGNOSIS — Z5189 Encounter for other specified aftercare: Secondary | ICD-10-CM | POA: Insufficient documentation

## 2014-02-09 DIAGNOSIS — J449 Chronic obstructive pulmonary disease, unspecified: Secondary | ICD-10-CM | POA: Insufficient documentation

## 2014-02-14 ENCOUNTER — Encounter (HOSPITAL_COMMUNITY)
Admission: RE | Admit: 2014-02-14 | Discharge: 2014-02-14 | Disposition: A | Discharge status: Home / Self Care | Payer: Medicare Other | Source: Ambulatory Visit | Attending: Internal Medicine | Admitting: Internal Medicine

## 2014-02-14 DIAGNOSIS — Z5189 Encounter for other specified aftercare: Secondary | ICD-10-CM | POA: Diagnosis not present

## 2014-02-15 NOTE — Addendum Note (Signed)
Encounter addended by: Gean Maidens on: 02/15/2014 12:09 PM<BR>     Documentation filed: Notes Section

## 2014-02-16 ENCOUNTER — Encounter (HOSPITAL_COMMUNITY)
Admission: RE | Admit: 2014-02-16 | Discharge: 2014-02-16 | Disposition: A | Discharge status: Home / Self Care | Payer: Medicare Other | Source: Ambulatory Visit | Attending: Internal Medicine | Admitting: Internal Medicine

## 2014-02-16 DIAGNOSIS — Z5189 Encounter for other specified aftercare: Secondary | ICD-10-CM | POA: Diagnosis not present

## 2014-02-21 ENCOUNTER — Encounter (HOSPITAL_COMMUNITY)
Admission: RE | Admit: 2014-02-21 | Discharge: 2014-02-21 | Disposition: A | Discharge status: Home / Self Care | Payer: Medicare Other | Source: Ambulatory Visit | Attending: Internal Medicine | Admitting: Internal Medicine

## 2014-02-21 DIAGNOSIS — Z5189 Encounter for other specified aftercare: Secondary | ICD-10-CM | POA: Diagnosis not present

## 2014-02-23 ENCOUNTER — Encounter (HOSPITAL_COMMUNITY)
Admission: RE | Admit: 2014-02-23 | Discharge: 2014-02-23 | Disposition: A | Discharge status: Home / Self Care | Payer: Medicare Other | Source: Ambulatory Visit | Attending: Internal Medicine | Admitting: Internal Medicine

## 2014-02-23 DIAGNOSIS — Z5189 Encounter for other specified aftercare: Secondary | ICD-10-CM | POA: Diagnosis not present

## 2014-02-24 DIAGNOSIS — L03032 Cellulitis of left toe: Secondary | ICD-10-CM | POA: Diagnosis not present

## 2014-02-24 DIAGNOSIS — M79675 Pain in left toe(s): Secondary | ICD-10-CM | POA: Diagnosis not present

## 2014-02-24 DIAGNOSIS — L600 Ingrowing nail: Secondary | ICD-10-CM | POA: Diagnosis not present

## 2014-02-24 NOTE — Psychosocial Assessment (Signed)
Amanda Palmer 80 y.o. female  30 day Psychosocial Note  Patient psychosocial assessment reveals no barriers to participation in Pulmonary Rehab. There are no psychosocial areas that are currently affecting patient's rehab experience. Patient does continue to exhibit positive coping skills to deal with her psychosocial concerns. Offered emotional support and reassurance. Patient does feel*she is making progress toward Pulmonary Rehab goals. Patient reports her health and activity level has improved in the past 30 days as evidenced by patient's report of increased ability to carry longer conversations without getting short of breath and do more daily activities. Patient states family/friends have noticed changes in her activity or mood. Patient reports feeling positive about current and projected progression in Pulmonary Rehab. After reviewing the patient's treatment plan, the patient is making progress toward Pulmonary Rehab goals. Patient's rate of progress toward rehab goals is excellent. Plan of action to help patient continue to work towards rehab goals include staff encouragement. Will continue to monitor and evaluate progress toward psychosocial goal(s).  Goal(s) in progress: Help patient work toward returning to meaningful activities that improve patient's QOL and are attainable with patient's lung disease Breath better Feel better

## 2014-02-28 ENCOUNTER — Encounter (HOSPITAL_COMMUNITY)
Admission: RE | Admit: 2014-02-28 | Discharge: 2014-02-28 | Disposition: A | Discharge status: Home / Self Care | Payer: Medicare Other | Source: Ambulatory Visit | Attending: Internal Medicine | Admitting: Internal Medicine

## 2014-02-28 DIAGNOSIS — Z5189 Encounter for other specified aftercare: Secondary | ICD-10-CM | POA: Diagnosis not present

## 2014-03-02 ENCOUNTER — Encounter (HOSPITAL_COMMUNITY)
Admission: RE | Admit: 2014-03-02 | Discharge: 2014-03-02 | Disposition: A | Discharge status: Home / Self Care | Payer: Medicare Other | Source: Ambulatory Visit | Attending: Internal Medicine | Admitting: Internal Medicine

## 2014-03-02 DIAGNOSIS — Z5189 Encounter for other specified aftercare: Secondary | ICD-10-CM | POA: Diagnosis not present

## 2014-03-07 ENCOUNTER — Encounter (HOSPITAL_COMMUNITY): Payer: Medicare Other

## 2014-03-08 ENCOUNTER — Encounter (HOSPITAL_COMMUNITY)
Admission: RE | Admit: 2014-03-08 | Discharge: 2014-03-08 | Disposition: A | Discharge status: Home / Self Care | Payer: Medicare Other | Source: Ambulatory Visit | Attending: Internal Medicine | Admitting: Internal Medicine

## 2014-03-08 DIAGNOSIS — Z5189 Encounter for other specified aftercare: Secondary | ICD-10-CM | POA: Diagnosis not present

## 2014-03-09 ENCOUNTER — Encounter (HOSPITAL_COMMUNITY): Payer: Medicare Other

## 2014-03-10 ENCOUNTER — Encounter (HOSPITAL_COMMUNITY): Payer: Medicare Other

## 2014-03-10 DIAGNOSIS — L600 Ingrowing nail: Secondary | ICD-10-CM | POA: Diagnosis not present

## 2014-03-10 DIAGNOSIS — L03032 Cellulitis of left toe: Secondary | ICD-10-CM | POA: Diagnosis not present

## 2014-03-10 DIAGNOSIS — M79675 Pain in left toe(s): Secondary | ICD-10-CM | POA: Diagnosis not present

## 2014-03-14 ENCOUNTER — Encounter (HOSPITAL_COMMUNITY): Payer: Medicare Other

## 2014-03-15 ENCOUNTER — Encounter (HOSPITAL_COMMUNITY)
Admission: RE | Admit: 2014-03-15 | Discharge: 2014-03-15 | Disposition: A | Discharge status: Home / Self Care | Payer: Medicare Other | Source: Ambulatory Visit | Attending: Internal Medicine | Admitting: Internal Medicine

## 2014-03-15 DIAGNOSIS — J449 Chronic obstructive pulmonary disease, unspecified: Secondary | ICD-10-CM | POA: Diagnosis not present

## 2014-03-15 DIAGNOSIS — Z5189 Encounter for other specified aftercare: Secondary | ICD-10-CM | POA: Insufficient documentation

## 2014-03-16 ENCOUNTER — Encounter (HOSPITAL_COMMUNITY): Payer: Medicare Other

## 2014-03-17 ENCOUNTER — Encounter (HOSPITAL_COMMUNITY): Payer: Medicare Other

## 2014-03-18 DIAGNOSIS — R531 Weakness: Secondary | ICD-10-CM | POA: Diagnosis not present

## 2014-03-18 DIAGNOSIS — M839 Adult osteomalacia, unspecified: Secondary | ICD-10-CM | POA: Diagnosis not present

## 2014-03-18 DIAGNOSIS — R5383 Other fatigue: Secondary | ICD-10-CM | POA: Diagnosis not present

## 2014-03-18 DIAGNOSIS — I499 Cardiac arrhythmia, unspecified: Secondary | ICD-10-CM | POA: Diagnosis not present

## 2014-03-18 DIAGNOSIS — Z79899 Other long term (current) drug therapy: Secondary | ICD-10-CM | POA: Diagnosis not present

## 2014-03-18 DIAGNOSIS — M791 Myalgia: Secondary | ICD-10-CM | POA: Diagnosis not present

## 2014-03-21 ENCOUNTER — Encounter (HOSPITAL_COMMUNITY): Payer: Medicare Other

## 2014-03-22 ENCOUNTER — Encounter (HOSPITAL_COMMUNITY)
Admission: RE | Admit: 2014-03-22 | Discharge: 2014-03-22 | Disposition: A | Discharge status: Home / Self Care | Payer: Medicare Other | Source: Ambulatory Visit | Attending: Internal Medicine | Admitting: Internal Medicine

## 2014-03-22 DIAGNOSIS — Z5189 Encounter for other specified aftercare: Secondary | ICD-10-CM | POA: Diagnosis not present

## 2014-03-22 DIAGNOSIS — J449 Chronic obstructive pulmonary disease, unspecified: Secondary | ICD-10-CM | POA: Diagnosis not present

## 2014-03-23 ENCOUNTER — Encounter (HOSPITAL_COMMUNITY): Payer: Medicare Other

## 2014-03-24 ENCOUNTER — Encounter (HOSPITAL_COMMUNITY)
Admission: RE | Admit: 2014-03-24 | Discharge: 2014-03-24 | Disposition: A | Discharge status: Home / Self Care | Payer: Medicare Other | Source: Ambulatory Visit | Attending: Internal Medicine | Admitting: Internal Medicine

## 2014-03-24 DIAGNOSIS — Z5189 Encounter for other specified aftercare: Secondary | ICD-10-CM | POA: Diagnosis not present

## 2014-03-24 DIAGNOSIS — J449 Chronic obstructive pulmonary disease, unspecified: Secondary | ICD-10-CM | POA: Diagnosis not present

## 2014-03-24 NOTE — Psychosocial Assessment (Signed)
[name] 81 y.o. female  60 day Psychosocial Note  Patient psychosocial assessment reveals no barriers to participation in Pulmonary Rehab. Patient does continue to exhibit positive coping skills to deal with her psychosocial concerns. Offered emotional support and reassurance. Patient does feel she is making progress toward Pulmonary Rehab goals. Patient reports her health and activity level has improved in the past 30 days as evidenced by patient's report of increased ability to walk longer distances. Patient states family/friends have noticed changes in her activity or mood. Patient reports feeling positive about current and projected progression in Pulmonary Rehab. After reviewing the patient's treatment plan, the patient is making progress toward Pulmonary Rehab goals. Patient's rate of progress toward rehab goals is excellent. Plan of action to help patient continue to work towards rehab goals include staff encouragement and support. Will continue to monitor and evaluate progress toward psychosocial goal(s).  Goal(s) in progress: Help patient work toward returning to meaningful activities that improve patient's QOL and are attainable with patient's lung disease Breath better Feel better

## 2014-03-24 NOTE — Progress Notes (Signed)
Pulmonary Rehabilitation Program Outcomes Report   Orientation:  01/27/14 Graduate Date:  tbd Discharge Date:  tbd # of sessions completed: 12  Pulmonologist: None Family MD:  Dr. Delphina Cahill Class Time:  1330  A.  Exercise Program:  Tolerates exercise @ 3.39 METS for 30 minutes  B.  Mental Health:  Good mental attitude  C.  Education/Instruction/Skills  Accurately checks own pulse.  Rest:  83  Exercise:  102, Knows THR for exercise and Uses Perceived Exertion Scale and/or Dyspnea Scale  Demonstrates accurate pursed lip breathing  D.  Nutrition/Weight Control/Body Composition:  Adherence to prescribed nutrition program: good    E.  Blood Lipids   No results found for: CHOL, HDL, LDLCALC, LDLDIRECT, TRIG, CHOLHDL  F.  Lifestyle Changes:  Making positive lifestyle changes  G.  Symptoms noted with exercise:  Asymptomatic  Report Completed By:  Felicity Coyer, RN   Comments:  Halfway note.

## 2014-03-28 ENCOUNTER — Encounter (HOSPITAL_COMMUNITY): Payer: Medicare Other

## 2014-03-29 ENCOUNTER — Encounter (HOSPITAL_COMMUNITY)
Admission: RE | Admit: 2014-03-29 | Discharge: 2014-03-29 | Disposition: A | Discharge status: Home / Self Care | Payer: Medicare Other | Source: Ambulatory Visit | Attending: Internal Medicine | Admitting: Internal Medicine

## 2014-03-29 DIAGNOSIS — R7301 Impaired fasting glucose: Secondary | ICD-10-CM | POA: Diagnosis not present

## 2014-03-29 DIAGNOSIS — J449 Chronic obstructive pulmonary disease, unspecified: Secondary | ICD-10-CM | POA: Diagnosis not present

## 2014-03-29 DIAGNOSIS — E782 Mixed hyperlipidemia: Secondary | ICD-10-CM | POA: Diagnosis not present

## 2014-03-29 DIAGNOSIS — Z5189 Encounter for other specified aftercare: Secondary | ICD-10-CM | POA: Diagnosis not present

## 2014-03-30 ENCOUNTER — Encounter (HOSPITAL_COMMUNITY): Payer: Medicare Other

## 2014-03-31 ENCOUNTER — Encounter (HOSPITAL_COMMUNITY)
Admission: RE | Admit: 2014-03-31 | Discharge: 2014-03-31 | Disposition: A | Discharge status: Home / Self Care | Payer: Medicare Other | Source: Ambulatory Visit | Attending: Internal Medicine | Admitting: Internal Medicine

## 2014-03-31 DIAGNOSIS — Z5189 Encounter for other specified aftercare: Secondary | ICD-10-CM | POA: Diagnosis not present

## 2014-03-31 DIAGNOSIS — J449 Chronic obstructive pulmonary disease, unspecified: Secondary | ICD-10-CM | POA: Diagnosis not present

## 2014-04-04 ENCOUNTER — Encounter (HOSPITAL_COMMUNITY): Payer: Medicare Other

## 2014-04-05 ENCOUNTER — Encounter (HOSPITAL_COMMUNITY)
Admission: RE | Admit: 2014-04-05 | Discharge: 2014-04-05 | Disposition: A | Discharge status: Home / Self Care | Payer: Medicare Other | Source: Ambulatory Visit | Attending: Internal Medicine | Admitting: Internal Medicine

## 2014-04-05 DIAGNOSIS — J449 Chronic obstructive pulmonary disease, unspecified: Secondary | ICD-10-CM | POA: Diagnosis not present

## 2014-04-05 DIAGNOSIS — Z5189 Encounter for other specified aftercare: Secondary | ICD-10-CM | POA: Diagnosis not present

## 2014-04-06 ENCOUNTER — Encounter (HOSPITAL_COMMUNITY): Payer: Medicare Other

## 2014-04-06 DIAGNOSIS — E782 Mixed hyperlipidemia: Secondary | ICD-10-CM | POA: Diagnosis not present

## 2014-04-06 DIAGNOSIS — D519 Vitamin B12 deficiency anemia, unspecified: Secondary | ICD-10-CM | POA: Diagnosis not present

## 2014-04-06 DIAGNOSIS — J449 Chronic obstructive pulmonary disease, unspecified: Secondary | ICD-10-CM | POA: Diagnosis not present

## 2014-04-06 DIAGNOSIS — I10 Essential (primary) hypertension: Secondary | ICD-10-CM | POA: Diagnosis not present

## 2014-04-07 ENCOUNTER — Encounter (HOSPITAL_COMMUNITY)
Admission: RE | Admit: 2014-04-07 | Discharge: 2014-04-07 | Disposition: A | Discharge status: Home / Self Care | Payer: Medicare Other | Source: Ambulatory Visit | Attending: Internal Medicine | Admitting: Internal Medicine

## 2014-04-07 DIAGNOSIS — J449 Chronic obstructive pulmonary disease, unspecified: Secondary | ICD-10-CM | POA: Diagnosis not present

## 2014-04-07 DIAGNOSIS — Z5189 Encounter for other specified aftercare: Secondary | ICD-10-CM | POA: Diagnosis not present

## 2014-04-11 ENCOUNTER — Encounter (HOSPITAL_COMMUNITY): Payer: Medicare Other

## 2014-04-11 DIAGNOSIS — L219 Seborrheic dermatitis, unspecified: Secondary | ICD-10-CM | POA: Diagnosis not present

## 2014-04-12 ENCOUNTER — Encounter (HOSPITAL_COMMUNITY)
Admission: RE | Admit: 2014-04-12 | Discharge: 2014-04-12 | Disposition: A | Discharge status: Home / Self Care | Payer: Medicare Other | Source: Ambulatory Visit | Attending: Internal Medicine | Admitting: Internal Medicine

## 2014-04-12 DIAGNOSIS — Z5189 Encounter for other specified aftercare: Secondary | ICD-10-CM | POA: Insufficient documentation

## 2014-04-12 DIAGNOSIS — J449 Chronic obstructive pulmonary disease, unspecified: Secondary | ICD-10-CM | POA: Insufficient documentation

## 2014-04-13 ENCOUNTER — Encounter (HOSPITAL_COMMUNITY): Payer: Medicare Other

## 2014-04-14 ENCOUNTER — Encounter (HOSPITAL_COMMUNITY)
Admission: RE | Admit: 2014-04-14 | Discharge: 2014-04-14 | Disposition: A | Discharge status: Home / Self Care | Payer: Medicare Other | Source: Ambulatory Visit | Attending: Internal Medicine | Admitting: Internal Medicine

## 2014-04-14 DIAGNOSIS — Z5189 Encounter for other specified aftercare: Secondary | ICD-10-CM | POA: Diagnosis not present

## 2014-04-14 DIAGNOSIS — J449 Chronic obstructive pulmonary disease, unspecified: Secondary | ICD-10-CM | POA: Diagnosis not present

## 2014-04-14 NOTE — Psychosocial Assessment (Signed)
Amanda Palmer 81 y.o. female  38 day Psychosocial Note  Patient psychosocial assessment reveals no barriers to participation in Pulmonary Rehab. Patient does continue to exhibit positive coping skills to deal with her psychosocial concerns. Offered emotional support and reassurance. Patient does feel she is making progress toward Pulmonary Rehab goals. Patient reports her health and activity level has improved in the past 30 days as evidenced by patient's report of increased energy level and feeling better. Patient states family/friends have noticed changes in her activity or mood. Patient reports feeling positive about current and projected progression in Pulmonary Rehab. After reviewing the patient's treatment plan, the patient is making progress toward Pulmonary Rehab goals. Patient's rate of progress toward rehab goals is excellent. Plan of action to help patient continue to work towards rehab goals include staff encouragement and support. Will continue to monitor and evaluate progress toward psychosocial goal(s).  Goal(s) in progress: Help patient work toward returning to meaningful activities that improve patient's QOL and are attainable with patient's lung disease Breath better Feel better

## 2014-04-18 ENCOUNTER — Encounter (HOSPITAL_COMMUNITY): Payer: Medicare Other

## 2014-04-19 ENCOUNTER — Encounter (HOSPITAL_COMMUNITY)
Admission: RE | Admit: 2014-04-19 | Discharge: 2014-04-19 | Disposition: A | Discharge status: Home / Self Care | Payer: Medicare Other | Source: Ambulatory Visit | Attending: Internal Medicine | Admitting: Internal Medicine

## 2014-04-19 DIAGNOSIS — Z5189 Encounter for other specified aftercare: Secondary | ICD-10-CM | POA: Diagnosis not present

## 2014-04-19 DIAGNOSIS — J449 Chronic obstructive pulmonary disease, unspecified: Secondary | ICD-10-CM | POA: Diagnosis not present

## 2014-04-20 ENCOUNTER — Encounter (HOSPITAL_COMMUNITY): Payer: Medicare Other

## 2014-04-21 ENCOUNTER — Encounter (HOSPITAL_COMMUNITY)
Admission: RE | Admit: 2014-04-21 | Discharge: 2014-04-21 | Disposition: A | Discharge status: Home / Self Care | Payer: Medicare Other | Source: Ambulatory Visit | Attending: Internal Medicine | Admitting: Internal Medicine

## 2014-04-21 DIAGNOSIS — J449 Chronic obstructive pulmonary disease, unspecified: Secondary | ICD-10-CM | POA: Diagnosis not present

## 2014-04-21 DIAGNOSIS — Z5189 Encounter for other specified aftercare: Secondary | ICD-10-CM | POA: Diagnosis not present

## 2014-04-26 ENCOUNTER — Encounter (HOSPITAL_COMMUNITY)
Admission: RE | Admit: 2014-04-26 | Discharge: 2014-04-26 | Disposition: A | Discharge status: Home / Self Care | Payer: Medicare Other | Source: Ambulatory Visit | Attending: Internal Medicine | Admitting: Internal Medicine

## 2014-04-26 DIAGNOSIS — Z5189 Encounter for other specified aftercare: Secondary | ICD-10-CM | POA: Diagnosis not present

## 2014-04-26 DIAGNOSIS — J449 Chronic obstructive pulmonary disease, unspecified: Secondary | ICD-10-CM | POA: Diagnosis not present

## 2014-04-28 ENCOUNTER — Encounter (HOSPITAL_COMMUNITY)
Admission: RE | Admit: 2014-04-28 | Discharge: 2014-04-28 | Disposition: A | Discharge status: Home / Self Care | Payer: Medicare Other | Source: Ambulatory Visit | Attending: Internal Medicine | Admitting: Internal Medicine

## 2014-04-28 DIAGNOSIS — Z5189 Encounter for other specified aftercare: Secondary | ICD-10-CM | POA: Diagnosis not present

## 2014-04-28 DIAGNOSIS — J449 Chronic obstructive pulmonary disease, unspecified: Secondary | ICD-10-CM | POA: Diagnosis not present

## 2014-05-03 ENCOUNTER — Encounter (HOSPITAL_COMMUNITY)
Admission: RE | Admit: 2014-05-03 | Discharge: 2014-05-03 | Disposition: A | Discharge status: Home / Self Care | Payer: Medicare Other | Source: Ambulatory Visit | Attending: Internal Medicine | Admitting: Internal Medicine

## 2014-05-03 DIAGNOSIS — Z5189 Encounter for other specified aftercare: Secondary | ICD-10-CM | POA: Diagnosis not present

## 2014-05-03 DIAGNOSIS — J449 Chronic obstructive pulmonary disease, unspecified: Secondary | ICD-10-CM | POA: Diagnosis not present

## 2014-05-05 NOTE — Progress Notes (Signed)
[facility]                                                             Final/Discharge Outcome Results  Anthropometrics: . Height (inches): 64 . Weight (kg): 65.2 ? This is a change of 3.1 from entrance. . Grip strength was measured using a Dynamometer.  Amanda patient's discharge score was a 48.7.   ? This is a change of 8.2% from entrance.  Functional Status/Exercise Capacity: . Amanda Palmer had a resting heart rate of 101 BPM, a resting blood pressure of 112/60, and an oxygen saturation of 95% on 0 liters of O2.  Amanda Palmer performed a discharge 6-minute walk test on 04/28/14.  Amanda patient completed 900 feet in 6 minutes with 0 rest breaks.  This quantifies 2.3 METS. Amanda patient's goal is to add 82 feet onto Amanda baseline 6MWT.   ? Amanda patient increased their 6-minute walk test distance by 300 feet and their MET level by 0.4 METs.  Dyspnea Measures: . Amanda Palmer is a simple and standardized method of classifying disability in patients with COPD.  Amanda assessment correlates disability and dyspnea.  Upon discharge Amanda patients resting score was 3. Amanda scale is provided below.  ? This is a change of 0 from entrance.  0= I only get breathless with strenuous exercise. 1= I get short of breath when hurrying on level ground or walking up a slight incline. 2= On level ground, I walk slower than people of Amanda same age because of breathlessness, or have to stop for breath when walking at my own pace. 3= I stop for breath after walking 100 yards or after a few minutes on level ground. 4=I am too breathless to leave Amanda house or I am breathless when dressing.   . Amanda patient completed Amanda [place] ([employer] [place]).  This questionnaire relates activities of daily living and shortness of breath.  Amanda score ranges from 0-120, a higher score relates to severe shortness of breath during activities  of daily living. Amanda patient's score at discharge was 56. ? This is a change of 30.2% from entrance.  Quality of Life: . Amanda Palmer is used to assess Amanda patients satisfaction in different domains of their life; health and functioning, socioeconomic, psychological/spiritual, and family. Amanda overall score is recorded out of 30 points.  Amanda patient's goal is to achieve an overall score of 21 or higher.  Amanda Palmer received a 23.89 upon discharge.  ? This is a change of -4.13% from entrance.  . Amanda Patient Health Questionnaire (PHQ-2) is a first step approach for Amanda screening of depression.  If Amanda patient scores positive on Amanda PHQ-2 Amanda patient should be further assessed with Amanda PHQ-9.  Amanda Patient Health Questionnaire (PHQ-9) assesses Amanda degree of depression.  Depression is important to monitor and track in pulmonary patients due to its prevalence in Amanda population.  If Amanda patient advances to Amanda PHQ-9 Amanda goal is to score less than 4 on this assessment.  Amanda Palmer scored a 0 at discharge. ? This is a change of 0 from entrance.   Clinical Assessment Tools: . Amanda COPD Assessment Test (CAT) is a measurement tool  to quantify how much of an impact Amanda disease has on Amanda patient's life.  This assessment aids Amanda Pulmonary Rehab Team in designing Amanda patients individualized treatment plan.  A CAT score ranges from 0-40.  A score of 10 or below indicates that COPD has a low impact on Amanda patient's life whereas a score of 30 or higher indicates a severe impact. Amanda patient's goal is a decrease of 1 point from entrance to discharge.  Amanda Palmer had a CAT score of 17 upon discharge.   ? This is a change of -22.7% from entrance.  Nutrition: . Amanda "Rate My Plate" is a dietary assessment that quantifies Amanda balance of a patient's diet.  This tool allows Amanda Pulmonary Rehab Team to key in on Amanda areas of Amanda patient's diet that needs improving.  Amanda team can then  focus their nutritional education on those areas.  If Amanda patient scores 24-40, this means there are many ways they can make their eating habits healthier, 41-57 states that there are some ways they can make their eating habits healthier and a score of 58-72 states that they are making many healthy choices.  Amanda patient's goal is to achieve a score of 49 or higher on this assessment.  Amanda Palmer scored a 42 upon discharge.   ? This is a change of -17.6% from entrance.  Oxygen Compliance: . Patient is currently on 0 liters at rest, 2 liters at night, and 0 liters for exercise.  Amanda Palmer is not currently using cpap/bipap at night. Amanda patient states that they do not have barriers that keep them from using their oxygen.   ? This is a no change result from entrance.    Education: . Amanda Palmer attended 13/13 education classes.  Amanda Palmer completed a discharge educational assessment and achieved a score of 6/14.  ? This is a change of -24.6% from entrance.   Smoking Cessation:  N/A  Exercise: . Amanda Palmer was provided with an individualized Home Exercise Prescription (HEP) at Amanda entrance of Amanda program.  Amanda patient's goal is to be exercising 30-60 minutes, 3-5 days per week. Upon discharge Amanda patient is exercising 1-2 days at home.  This is a change of 0 from entrance.  After graduation from Pulmonary Rehab, Amanda Palmer will continue exercising at apartment gym.

## 2014-05-05 NOTE — Progress Notes (Signed)
Patient is discharged from [facility] and Pulmonary program today, May 03, 2014 with 24 sessions.  She achieved LTG of 30 minutes of aerobic exercise at max met level of 3.40 .  All patient vitals are WNL.  Patient has met with dietician.  Discharge instructions have been reviewed in detail and patient expressed an understanding of material given.  Patient plans to exercise at home at her apartment gym.  Pulmonary Rehab will make 1 month, 6 month and 1 year call backs.  Patient had no complaints of any abnormal S/S or pain on their exit visit.

## 2014-05-13 ENCOUNTER — Encounter: Payer: Self-pay | Admitting: *Deleted

## 2014-05-17 ENCOUNTER — Ambulatory Visit (INDEPENDENT_AMBULATORY_CARE_PROVIDER_SITE_OTHER): Payer: Medicare Other | Admitting: Cardiovascular Disease

## 2014-05-17 ENCOUNTER — Encounter: Payer: Self-pay | Admitting: Cardiovascular Disease

## 2014-05-17 VITALS — BP 130/60 | HR 85 | Ht 64.0 in | Wt 144.0 lb

## 2014-05-17 DIAGNOSIS — I10 Essential (primary) hypertension: Secondary | ICD-10-CM

## 2014-05-17 DIAGNOSIS — I499 Cardiac arrhythmia, unspecified: Secondary | ICD-10-CM

## 2014-05-17 DIAGNOSIS — I4891 Unspecified atrial fibrillation: Secondary | ICD-10-CM | POA: Diagnosis not present

## 2014-05-17 DIAGNOSIS — I498 Other specified cardiac arrhythmias: Secondary | ICD-10-CM

## 2014-05-17 DIAGNOSIS — E785 Hyperlipidemia, unspecified: Secondary | ICD-10-CM

## 2014-05-17 DIAGNOSIS — J438 Other emphysema: Secondary | ICD-10-CM | POA: Diagnosis not present

## 2014-05-17 NOTE — Progress Notes (Signed)
Patient ID: Amanda Palmer, female   DOB: 12-09-1933, 81 y.o.   MRN: [id number]       CARDIOLOGY CONSULT NOTE  Patient ID: Amanda Palmer MRN: [id number] DOB/AGE: May 07, 1933 81 y.o.  Admit date: (Not on file) Primary Physician Delphina Cahill, MD  Reason for Consultation: atrial fibrillation  HPI: The patient is a 81 year old woman with a history of hypertension and hyperlipidemia who has been referred for the evaluation of "atrial fibrillation". She was evaluated by a cardiologist with [facility] in 05/2013 for chest pain. She underwent a normal nuclear stress test in February 2015 with no ischemia and a calculated LVEF of 85%. She was reportedly hospitalized at [facility] approximately one year ago for chest pain with normal troponins. An echocardiogram in January 2015 reportedly revealed an ejection fraction of 60-65% with grade 1 diastolic dysfunction and mild mitral and tricuspid regurgitation. She has a history of a TIA and is on full strength aspirin and also has a history of breast cancer. She was previously value to for palpitations and there were PACs noted on the ECG.  ECG performed on 03/18/14 demonstrated normal sinus rhythm with a nonspecific ST segment abnormality.  She tells me that while she was participating in pulmonary rehabilitation, several nurses told her that she had atrial fibrillation. The patient was asymptomatic. She tells me her heart rate was 110 bpm. No ECG was performed during any of these reported events. She then saw a PA at her primary care physician's office who also told her she had atrial fibrillation. The aforementioned ECG does not reveal this. She denies chest pain, palpitations, lightheadedness, dizziness, and leg swelling. She has chronic exertional dyspnea from COPD which is unchanged.  I reviewed all the notes from pulmonary rehabilitation and see no mention of atrial fibrillation.    Allergies  Allergen Reactions  . Morphine And Related Nausea And  Vomiting  . Sulfa Antibiotics Nausea And Vomiting  . Penicillins Rash    Current Outpatient Prescriptions  Medication Sig Dispense Refill  . albuterol (PROVENTIL HFA;VENTOLIN HFA) 108 (90 BASE) MCG/ACT inhaler Inhale 1-2 puffs into the lungs every 6 (six) hours as needed for wheezing or shortness of breath.    Marland Kitchen arformoterol (BROVANA) 15 MCG/2ML NEBU Take 15 mcg by nebulization daily.    Marland Kitchen aspirin 325 MG tablet Take 325 mg by mouth daily.    Marland Kitchen atorvastatin (LIPITOR) 20 MG tablet Take 20 mg by mouth.    . budesonide (PULMICORT) 0.25 MG/2ML nebulizer solution Take 0.25 mg by nebulization daily.    . Calcium Carb-Cholecalciferol (CALCIUM + D3) 600-200 MG-UNIT TABS Take by mouth.    . clonazePAM (KLONOPIN) 1 MG tablet Take 1 mg by mouth.    . gabapentin (NEURONTIN) 300 MG capsule     . HYDROcodone-acetaminophen (NORCO/VICODIN) 5-325 MG per tablet Take 1 tablet by mouth every 6 (six) hours as needed for moderate pain.    Marland Kitchen losartan-hydrochlorothiazide (HYZAAR) 100-25 MG per tablet Take 1 tablet by mouth.    . montelukast (SINGULAIR) 10 MG tablet Take 10 mg by mouth daily.    . nitroGLYCERIN (NITROSTAT) 0.4 MG SL tablet Place 0.4 mg under the tongue.    Marland Kitchen omeprazole (PRILOSEC) 20 MG capsule     . traMADol (ULTRAM) 50 MG tablet     . triamcinolone cream (KENALOG) 0.1 % Apply 1 application topically 2 (two) times daily.    Marland Kitchen venlafaxine (EFFEXOR) 75 MG tablet Take 75 mg by mouth.     No current  facility-administered medications for this visit.    Past Medical History  Diagnosis Date  . Cancer     breast  . Hypertension     Past Surgical History  Procedure Laterality Date  . Cardiac catheterization    . Total knee arthroplasty      bilateral  . Total hip arthroplasty      right  . Rotator cuff repair      left  . Mastectomy      right     History   Social History  . Marital Status: Widowed    Spouse Name: N/A  . Number of Children: N/A  . Years of Education: N/A    Occupational History  . Not on file.   Social History Main Topics  . Smoking status: Never Smoker   . Smokeless tobacco: Never Used  . Alcohol Use: No  . Drug Use: No  . Sexual Activity: Not on file   Other Topics Concern  . Not on file   Social History Narrative     No family history of premature CAD in 1st degree relatives.  Prior to Admission medications   Medication Sig Start Date End Date Taking? Authorizing Provider  albuterol (PROVENTIL HFA;VENTOLIN HFA) 108 (90 BASE) MCG/ACT inhaler Inhale 1-2 puffs into the lungs every 6 (six) hours as needed for wheezing or shortness of breath.    Historical Provider, MD  arformoterol (BROVANA) 15 MCG/2ML NEBU Take 15 mcg by nebulization daily.    Historical Provider, MD  aspirin 325 MG tablet Take 325 mg by mouth daily.    Historical Provider, MD  atorvastatin (LIPITOR) 20 MG tablet Take 20 mg by mouth.    Historical Provider, MD  budesonide (PULMICORT) 0.25 MG/2ML nebulizer solution Take 0.25 mg by nebulization daily.    Historical Provider, MD  Calcium Carb-Cholecalciferol (CALCIUM + D3) 600-200 MG-UNIT TABS Take by mouth.    Historical Provider, MD  clonazePAM (KLONOPIN) 1 MG tablet Take 1 mg by mouth.    Historical Provider, MD  gabapentin (NEURONTIN) 300 MG capsule  04/29/14   Historical Provider, MD  HYDROcodone-acetaminophen (NORCO/VICODIN) 5-325 MG per tablet Take 1 tablet by mouth every 6 (six) hours as needed for moderate pain.    Historical Provider, MD  losartan-hydrochlorothiazide (HYZAAR) 100-25 MG per tablet Take 1 tablet by mouth.    Historical Provider, MD  montelukast (SINGULAIR) 10 MG tablet Take 10 mg by mouth daily.    Historical Provider, MD  nitroGLYCERIN (NITROSTAT) 0.4 MG SL tablet Place 0.4 mg under the tongue.    Historical Provider, MD  omeprazole (PRILOSEC) 20 MG capsule  04/20/14   Historical Provider, MD  traMADol Veatrice Bourbon) 50 MG tablet  03/12/13   Historical Provider, MD  triamcinolone cream (KENALOG) 0.1 %  Apply 1 application topically 2 (two) times daily.    Historical Provider, MD  venlafaxine (EFFEXOR) 75 MG tablet Take 75 mg by mouth.    Historical Provider, MD     Review of systems complete and found to be negative unless listed above in HPI     Physical exam Height 5\' 4"  (1.626 m), weight 144 lb (65.318 kg).   BP 130/60  Pulse 85  SpO2 97%  General: NAD Neck: No JVD, no thyromegaly or thyroid nodule.  Lungs: Clear to auscultation bilaterally with normal respiratory effort. CV: Nondisplaced PMI. Regular rate and mostly regular rhythm with premature contractions appreciated in a trigeminal fashion, normal S1/S2, no S3/S4, soft 1/6 holosystolic murmur along the left lower  sternal border.  No peripheral edema.  No carotid bruit.  Normal pedal pulses.  Abdomen: Soft, nontender, no hepatosplenomegaly, no distention.  Skin: Intact without lesions or rashes.  Neurologic: Alert and oriented x 3.  Psych: Normal affect. Extremities: No clubbing or cyanosis.  HEENT: Normal.   ECG: Most recent ECG reviewed.  Labs:  No results found for: WBC, HGB, HCT, MCV, PLT No results for input(s): NA, K, CL, CO2, BUN, CREATININE, CALCIUM, PROT, BILITOT, ALKPHOS, ALT, AST, GLUCOSE in the last 168 hours.  Invalid input(s): LABALBU No results found for: CKTOTAL, CKMB, CKMBINDEX, TROPONINI No results found for: CHOL No results found for: HDL No results found for: LDLCALC No results found for: TRIG No results found for: CHOLHDL No results found for: LDLDIRECT       Studies: No results found.  ASSESSMENT AND PLAN:  1. Arrhythmia: Again, there is no actual objective data supporting the diagnosis of atrial fibrillation. Her physical exam is notable for a regular rhythm with trigeminal premature contractions. I will obtain a 30 day event monitor to evaluate for atrial fibrillation. She is entirely asymptomatic. Continue ASA 325 mg for h/o TIA.  2. Essential HTN: Well controlled. No changes.  3.  Hyperlipidemia: Continue Lipitor 20 mg.  Dispo: f/u 2 months.   Signed: Kate Sable, M.D., F.A.C.C.  05/17/2014, 1:44 PM

## 2014-05-17 NOTE — Patient Instructions (Signed)
Your physician has recommended that you wear a 30 day event monitor. Event monitors are medical devices that record the heart's electrical activity. Doctors most often [place] these monitors to diagnose arrhythmias. Arrhythmias are problems with the speed or rhythm of the heartbeat. The monitor is a small, portable device. You can wear one while you do your normal daily activities. This is usually used to diagnose what is causing palpitations/syncope (passing out). Office will contact with results via phone or letter.    Continue all current medications. Follow up in  2 months

## 2014-05-21 ENCOUNTER — Inpatient Hospital Stay (HOSPITAL_COMMUNITY)
Admission: EM | Admit: 2014-05-21 | Discharge: 2014-05-23 | DRG: 192 | Disposition: A | Discharge status: Home / Self Care | Payer: Medicare Other | Attending: Internal Medicine | Admitting: Internal Medicine

## 2014-05-21 ENCOUNTER — Encounter (HOSPITAL_COMMUNITY): Payer: Self-pay | Admitting: Emergency Medicine

## 2014-05-21 DIAGNOSIS — R05 Cough: Secondary | ICD-10-CM | POA: Diagnosis not present

## 2014-05-21 DIAGNOSIS — Z96641 Presence of right artificial hip joint: Secondary | ICD-10-CM | POA: Diagnosis present

## 2014-05-21 DIAGNOSIS — I10 Essential (primary) hypertension: Secondary | ICD-10-CM | POA: Diagnosis not present

## 2014-05-21 DIAGNOSIS — Z853 Personal history of malignant neoplasm of breast: Secondary | ICD-10-CM

## 2014-05-21 DIAGNOSIS — Z7982 Long term (current) use of aspirin: Secondary | ICD-10-CM

## 2014-05-21 DIAGNOSIS — R0602 Shortness of breath: Secondary | ICD-10-CM | POA: Diagnosis not present

## 2014-05-21 DIAGNOSIS — Z9981 Dependence on supplemental oxygen: Secondary | ICD-10-CM

## 2014-05-21 DIAGNOSIS — J441 Chronic obstructive pulmonary disease with (acute) exacerbation: Secondary | ICD-10-CM | POA: Diagnosis not present

## 2014-05-21 DIAGNOSIS — I679 Cerebrovascular disease, unspecified: Secondary | ICD-10-CM

## 2014-05-21 DIAGNOSIS — Z66 Do not resuscitate: Secondary | ICD-10-CM | POA: Diagnosis present

## 2014-05-21 DIAGNOSIS — Z79891 Long term (current) use of opiate analgesic: Secondary | ICD-10-CM

## 2014-05-21 DIAGNOSIS — Z9011 Acquired absence of right breast and nipple: Secondary | ICD-10-CM | POA: Diagnosis present

## 2014-05-21 DIAGNOSIS — K219 Gastro-esophageal reflux disease without esophagitis: Secondary | ICD-10-CM | POA: Diagnosis present

## 2014-05-21 DIAGNOSIS — I739 Peripheral vascular disease, unspecified: Secondary | ICD-10-CM

## 2014-05-21 DIAGNOSIS — Z7951 Long term (current) use of inhaled steroids: Secondary | ICD-10-CM

## 2014-05-21 DIAGNOSIS — J449 Chronic obstructive pulmonary disease, unspecified: Secondary | ICD-10-CM | POA: Diagnosis present

## 2014-05-21 DIAGNOSIS — R069 Unspecified abnormalities of breathing: Secondary | ICD-10-CM | POA: Diagnosis not present

## 2014-05-21 DIAGNOSIS — Z96653 Presence of artificial knee joint, bilateral: Secondary | ICD-10-CM | POA: Diagnosis present

## 2014-05-21 HISTORY — DX: Chronic obstructive pulmonary disease, unspecified: J44.9

## 2014-05-21 LAB — CBC WITH DIFFERENTIAL/PLATELET
Basophils Absolute: 0 10*3/uL (ref 0.0–0.1)
Basophils Relative: 0 % (ref 0–1)
EOS PCT: 2 % (ref 0–5)
Eosinophils Absolute: 0.1 10*3/uL (ref 0.0–0.7)
HCT: 32.8 % — ABNORMAL LOW (ref 36.0–46.0)
Hemoglobin: 10.5 g/dL — ABNORMAL LOW (ref 12.0–15.0)
LYMPHS ABS: 0.7 10*3/uL (ref 0.7–4.0)
Lymphocytes Relative: 16 % (ref 12–46)
MCH: 30.4 pg (ref 26.0–34.0)
MCHC: 32 g/dL (ref 30.0–36.0)
MCV: 95.1 fL (ref 78.0–100.0)
MONO ABS: 0.4 10*3/uL (ref 0.1–1.0)
Monocytes Relative: 8 % (ref 3–12)
Neutro Abs: 3.6 10*3/uL (ref 1.7–7.7)
Neutrophils Relative %: 74 % (ref 43–77)
Platelets: 202 10*3/uL (ref 150–400)
RBC: 3.45 MIL/uL — ABNORMAL LOW (ref 3.87–5.11)
RDW: 13.8 % (ref 11.5–15.5)
WBC: 4.8 10*3/uL (ref 4.0–10.5)

## 2014-05-21 MED ORDER — SODIUM CHLORIDE 0.9 % IV SOLN
INTRAVENOUS | Status: DC
  Administered 2014-05-22: 04:00:00 via INTRAVENOUS

## 2014-05-21 MED ORDER — IPRATROPIUM BROMIDE 0.02 % IN SOLN
0.5000 mg | Freq: Once | RESPIRATORY_TRACT | Status: AC
  Administered 2014-05-21: 0.5 mg via RESPIRATORY_TRACT
  Filled 2014-05-21: qty 2.5

## 2014-05-21 MED ORDER — ALBUTEROL (5 MG/ML) CONTINUOUS INHALATION SOLN
10.0000 mg/h | INHALATION_SOLUTION | Freq: Once | RESPIRATORY_TRACT | Status: AC
  Administered 2014-05-21: 10 mg/h via RESPIRATORY_TRACT
  Filled 2014-05-21: qty 20

## 2014-05-21 NOTE — ED Notes (Signed)
Per EMS: Pt c/o SOB. On O2 at home as need. Pt at 98% on 2L at time of arrival per EMS. Productive cough. Pt has not taken her inhaler or breathing tx for tonight.

## 2014-05-21 NOTE — ED Provider Notes (Signed)
CSN: [id number]     Arrival date & time 05/21/14  2205 History  This chart was scribed for Lacretia Leigh, MD by Edison Simon, ED Scribe. This patient was seen in room APA11/APA11 and the patient's care was started at 11:11 PM.    Chief Complaint  Patient presents with  . Shortness of Breath   The history is provided by the patient. No language interpreter was used.    HPI Comments: Amanda Palmer is a 81 y.o. female with history of COPD who presents to the Emergency Department complaining of with onset all night, persisting all day today. She reports associated cough that became worse yesterday. She states she occasionally has "days of SOB" but states this is worse than usual. She states she has chest pain with cough. She states cough produces yellowish phlegm. She states she had fever measured highest at 99.8. She uses oxygen at night. She has a nebulizer and uses 5 treatments a day; she states she has not been using it more frequently lately. She states she does not normally take steroids when COPD is exacerbated. She denies sick contacts. She denies nausea, vomiting, or diarrhea  Past Medical History  Diagnosis Date  . Cancer     breast  . Hypertension   . COPD (chronic obstructive pulmonary disease)    Past Surgical History  Procedure Laterality Date  . Cardiac catheterization    . Total knee arthroplasty      bilateral  . Total hip arthroplasty      right  . Rotator cuff repair      left  . Mastectomy      right    No family history on file. History  Substance Use Topics  . Smoking status: Never Smoker   . Smokeless tobacco: Never Used  . Alcohol Use: No   OB History    No data available     Review of Systems  Constitutional: Positive for fever.  Respiratory: Positive for cough and shortness of breath.   Cardiovascular: Positive for chest pain.  Gastrointestinal: Negative for nausea, vomiting and diarrhea.  All other systems reviewed and are  negative.     Allergies  Morphine and related; Sulfa antibiotics; Penicillins; and Prednisone  Home Medications   Prior to Admission medications   Medication Sig Start Date End Date Taking? Authorizing Provider  albuterol (PROVENTIL HFA;VENTOLIN HFA) 108 (90 BASE) MCG/ACT inhaler Inhale 1-2 puffs into the lungs every 6 (six) hours as needed for wheezing or shortness of breath.   Yes Historical Provider, MD  albuterol (PROVENTIL) (2.5 MG/3ML) 0.083% nebulizer solution Take 2.5 mg by nebulization every 6 (six) hours as needed for wheezing or shortness of breath.   Yes Historical Provider, MD  arformoterol (BROVANA) 15 MCG/2ML NEBU Take 15 mcg by nebulization 2 (two) times daily.    Yes Historical Provider, MD  aspirin 325 MG tablet Take 325 mg by mouth daily.   Yes Historical Provider, MD  atorvastatin (LIPITOR) 20 MG tablet Take 20 mg by mouth.   Yes Historical Provider, MD  budesonide (PULMICORT) 0.25 MG/2ML nebulizer solution Take 0.25 mg by nebulization 2 (two) times daily.    Yes Historical Provider, MD  Calcium Carb-Cholecalciferol (CALCIUM + D3) 600-200 MG-UNIT TABS Take by mouth.   Yes Historical Provider, MD  clonazePAM (KLONOPIN) 1 MG tablet Take 1 mg by mouth at bedtime.    Yes Historical Provider, MD  esomeprazole (NEXIUM) 40 MG capsule Take 40 mg by mouth daily at 12 noon.  Yes Historical Provider, MD  gabapentin (NEURONTIN) 300 MG capsule Take 300 mg by mouth at bedtime.  04/29/14  Yes Historical Provider, MD  losartan-hydrochlorothiazide (HYZAAR) 100-25 MG per tablet Take 1 tablet by mouth.   Yes Historical Provider, MD  montelukast (SINGULAIR) 10 MG tablet Take 10 mg by mouth daily.   Yes Historical Provider, MD  Multiple Vitamin (MULTIVITAMIN WITH MINERALS) TABS tablet Take 1 tablet by mouth daily.   Yes Historical Provider, MD  omeprazole (PRILOSEC) 20 MG capsule Take 20 mg by mouth daily.  04/20/14  Yes Historical Provider, MD  traMADol (ULTRAM) 50 MG tablet Take 50 mg by  mouth every 6 (six) hours as needed for moderate pain.  03/12/13  Yes Historical Provider, MD  triamcinolone cream (KENALOG) 0.1 % Apply 1 application topically 2 (two) times daily as needed.    Yes Historical Provider, MD  venlafaxine (EFFEXOR) 75 MG tablet Take 75 mg by mouth 3 (three) times daily with meals.    Yes Historical Provider, MD  HYDROcodone-acetaminophen (NORCO/VICODIN) 5-325 MG per tablet Take 1 tablet by mouth every 6 (six) hours as needed for moderate pain.    Historical Provider, MD  nitroGLYCERIN (NITROSTAT) 0.4 MG SL tablet Place 0.4 mg under the tongue.    Historical Provider, MD   BP 127/41 mmHg  Pulse 79  Temp(Src) 98.5 F (36.9 C) (Oral)  Resp 16  SpO2 100% Physical Exam  Constitutional: She is oriented to person, place, and time. She appears well-developed and well-nourished.  Non-toxic appearance. No distress.  HENT:  Head: Normocephalic and atraumatic.  Eyes: Conjunctivae, EOM and lids are normal. Pupils are equal, round, and reactive to light.  Neck: Normal range of motion. Neck supple. No tracheal deviation present. No thyroid mass present.  Cardiovascular: Normal rate, regular rhythm and normal heart sounds.  Exam reveals no gallop.   No murmur heard. Pulmonary/Chest: Effort normal. No stridor. No respiratory distress. She has no decreased breath sounds. She has wheezes (Faint wheezing bilaterally). She has no rhonchi. She has no rales. She exhibits tenderness (Left anterior chest wall tenderness without crepitus).  Abdominal: Soft. Normal appearance and bowel sounds are normal. She exhibits no distension. There is no tenderness. There is no rebound and no CVA tenderness.  Musculoskeletal: Normal range of motion. She exhibits no edema or tenderness.  Neurological: She is alert and oriented to person, place, and time. She has normal strength. No cranial nerve deficit or sensory deficit. GCS eye subscore is 4. GCS verbal subscore is 5. GCS motor subscore is 6.  Skin:  Skin is warm and dry. No abrasion and no rash noted.  Psychiatric: She has a normal mood and affect. Her speech is normal and behavior is normal.  Nursing note and vitals reviewed.   ED Course  Procedures (including critical care time)  DIAGNOSTIC STUDIES: Oxygen Saturation is 100% on nasal canula, normal by my interpretation.    COORDINATION OF CARE: 11:18 PM Discussed treatment plan with patient at beside, including blood work and chest x-ray. The patient agrees with the plan and has no further questions at this time.   Labs Review Labs Reviewed - No data to display  Imaging Review No results found.   EKG Interpretation   Date/Time:  Sunday May 22 2014 00:33:57 EST Ventricular Rate:  84 PR Interval:  153 QRS Duration: 87 QT Interval:  380 QTC Calculation: 449 R Axis:   -24 Text Interpretation:  Sinus rhythm Atrial premature complexes Borderline  left axis deviation Low  voltage, precordial leads Abnormal R-wave  progression, early transition Confirmed by Miryam Mcelhinney  MD, Raunak Antuna ([id number]) on  05/22/2014 12:46:21 AM      MDM   Final diagnoses:  None    I personally performed the services described in this documentation, which was scribed in my presence. The recorded information has been reviewed and is accurate.  Pt given solumedrol and albuterol and feels slightly better, will admit for obs  Lacretia Leigh, MD 05/22/14 [phone]

## 2014-05-22 ENCOUNTER — Emergency Department (HOSPITAL_COMMUNITY): Payer: Medicare Other

## 2014-05-22 DIAGNOSIS — J441 Chronic obstructive pulmonary disease with (acute) exacerbation: Secondary | ICD-10-CM | POA: Diagnosis not present

## 2014-05-22 DIAGNOSIS — R05 Cough: Secondary | ICD-10-CM | POA: Diagnosis not present

## 2014-05-22 DIAGNOSIS — Z9011 Acquired absence of right breast and nipple: Secondary | ICD-10-CM | POA: Diagnosis present

## 2014-05-22 DIAGNOSIS — J449 Chronic obstructive pulmonary disease, unspecified: Secondary | ICD-10-CM | POA: Diagnosis present

## 2014-05-22 DIAGNOSIS — Z79891 Long term (current) use of opiate analgesic: Secondary | ICD-10-CM | POA: Diagnosis not present

## 2014-05-22 DIAGNOSIS — Z7982 Long term (current) use of aspirin: Secondary | ICD-10-CM | POA: Diagnosis not present

## 2014-05-22 DIAGNOSIS — Z66 Do not resuscitate: Secondary | ICD-10-CM | POA: Diagnosis present

## 2014-05-22 DIAGNOSIS — K219 Gastro-esophageal reflux disease without esophagitis: Secondary | ICD-10-CM | POA: Diagnosis present

## 2014-05-22 DIAGNOSIS — Z96653 Presence of artificial knee joint, bilateral: Secondary | ICD-10-CM | POA: Diagnosis present

## 2014-05-22 DIAGNOSIS — R0602 Shortness of breath: Secondary | ICD-10-CM | POA: Diagnosis present

## 2014-05-22 DIAGNOSIS — Z853 Personal history of malignant neoplasm of breast: Secondary | ICD-10-CM | POA: Diagnosis not present

## 2014-05-22 DIAGNOSIS — I10 Essential (primary) hypertension: Secondary | ICD-10-CM | POA: Diagnosis not present

## 2014-05-22 DIAGNOSIS — Z9981 Dependence on supplemental oxygen: Secondary | ICD-10-CM | POA: Diagnosis not present

## 2014-05-22 DIAGNOSIS — Z7951 Long term (current) use of inhaled steroids: Secondary | ICD-10-CM | POA: Diagnosis not present

## 2014-05-22 DIAGNOSIS — Z96641 Presence of right artificial hip joint: Secondary | ICD-10-CM | POA: Diagnosis present

## 2014-05-22 LAB — BASIC METABOLIC PANEL
Anion gap: 6 (ref 5–15)
BUN: 15 mg/dL (ref 6–23)
CALCIUM: 9 mg/dL (ref 8.4–10.5)
CHLORIDE: 100 mmol/L (ref 96–112)
CO2: 31 mmol/L (ref 19–32)
Creatinine, Ser: 0.97 mg/dL (ref 0.50–1.10)
GFR calc non Af Amer: 55 mL/min — ABNORMAL LOW (ref >90)
GFR, EST AFRICAN AMERICAN: 63 mL/min — AB (ref >90)
Glucose, Bld: 89 mg/dL (ref 70–99)
Potassium: 3.8 mmol/L (ref 3.5–5.1)
Sodium: 137 mmol/L (ref 135–145)

## 2014-05-22 LAB — COMPREHENSIVE METABOLIC PANEL
ALT: 13 U/L (ref 0–35)
AST: 18 U/L (ref 0–37)
Albumin: 3.2 g/dL — ABNORMAL LOW (ref 3.5–5.2)
Alkaline Phosphatase: 72 U/L (ref 39–117)
Anion gap: 6 (ref 5–15)
BUN: 15 mg/dL (ref 6–23)
CO2: 31 mmol/L (ref 19–32)
Calcium: 8.8 mg/dL (ref 8.4–10.5)
Chloride: 100 mmol/L (ref 96–112)
Creatinine, Ser: 0.84 mg/dL (ref 0.50–1.10)
GFR calc non Af Amer: 65 mL/min — ABNORMAL LOW (ref >90)
GFR, EST AFRICAN AMERICAN: 75 mL/min — AB (ref >90)
Glucose, Bld: 117 mg/dL — ABNORMAL HIGH (ref 70–99)
Potassium: 3.4 mmol/L — ABNORMAL LOW (ref 3.5–5.1)
SODIUM: 137 mmol/L (ref 135–145)
Total Bilirubin: 0.3 mg/dL (ref 0.3–1.2)
Total Protein: 6 g/dL (ref 6.0–8.3)

## 2014-05-22 LAB — URINALYSIS, ROUTINE W REFLEX MICROSCOPIC
BILIRUBIN URINE: NEGATIVE
GLUCOSE, UA: NEGATIVE mg/dL
HGB URINE DIPSTICK: NEGATIVE
KETONES UR: NEGATIVE mg/dL
Leukocytes, UA: NEGATIVE
Nitrite: NEGATIVE
Protein, ur: NEGATIVE mg/dL
Specific Gravity, Urine: 1.01 (ref 1.005–1.030)
Urobilinogen, UA: 0.2 mg/dL (ref 0.0–1.0)
pH: 7 (ref 5.0–8.0)

## 2014-05-22 LAB — INFLUENZA PANEL BY PCR (TYPE A & B)
H1N1FLUPCR: NOT DETECTED
INFLAPCR: NEGATIVE
INFLBPCR: NEGATIVE

## 2014-05-22 LAB — CBC
HCT: 31 % — ABNORMAL LOW (ref 36.0–46.0)
HEMOGLOBIN: 9.9 g/dL — AB (ref 12.0–15.0)
MCH: 30.3 pg (ref 26.0–34.0)
MCHC: 31.9 g/dL (ref 30.0–36.0)
MCV: 94.8 fL (ref 78.0–100.0)
Platelets: 181 10*3/uL (ref 150–400)
RBC: 3.27 MIL/uL — ABNORMAL LOW (ref 3.87–5.11)
RDW: 13.8 % (ref 11.5–15.5)
WBC: 4.8 10*3/uL (ref 4.0–10.5)

## 2014-05-22 LAB — TROPONIN I: Troponin I: <0.03 ng/mL (ref <0.031)

## 2014-05-22 LAB — BRAIN NATRIURETIC PEPTIDE: B NATRIURETIC PEPTIDE 5: 67 pg/mL (ref 0.0–100.0)

## 2014-05-22 MED ORDER — METHYLPREDNISOLONE SODIUM SUCC 125 MG IJ SOLR
60.0000 mg | Freq: Four times a day (QID) | INTRAMUSCULAR | Status: DC
Start: 2014-05-22 — End: 2014-05-23
  Administered 2014-05-22 – 2014-05-23 (×5): 60 mg via INTRAVENOUS
  Filled 2014-05-22 (×5): qty 2

## 2014-05-22 MED ORDER — LOSARTAN POTASSIUM-HCTZ 100-25 MG PO TABS: 1.0000 | ORAL_TABLET | Freq: Every day | ORAL | Status: DC

## 2014-05-22 MED ORDER — LOSARTAN POTASSIUM 50 MG PO TABS
100.0000 mg | ORAL_TABLET | Freq: Every day | ORAL | Status: DC
  Administered 2014-05-22 – 2014-05-23 (×2): 100 mg via ORAL
  Filled 2014-05-22 (×2): qty 2

## 2014-05-22 MED ORDER — IPRATROPIUM BROMIDE 0.02 % IN SOLN: 0.5000 mg | Freq: Four times a day (QID) | RESPIRATORY_TRACT | Status: DC

## 2014-05-22 MED ORDER — GABAPENTIN 300 MG PO CAPS
300.0000 mg | ORAL_CAPSULE | Freq: Every day | ORAL | Status: DC
  Administered 2014-05-22: 300 mg via ORAL
  Filled 2014-05-22: qty 1

## 2014-05-22 MED ORDER — LEVOFLOXACIN IN D5W 500 MG/100ML IV SOLN
500.0000 mg | INTRAVENOUS | Status: DC
  Administered 2014-05-22 – 2014-05-23 (×2): 500 mg via INTRAVENOUS
  Filled 2014-05-22 (×3): qty 100

## 2014-05-22 MED ORDER — VENLAFAXINE HCL 37.5 MG PO TABS
75.0000 mg | ORAL_TABLET | Freq: Three times a day (TID) | ORAL | Status: DC
  Administered 2014-05-22 – 2014-05-23 (×5): 75 mg via ORAL
  Filled 2014-05-22 (×5): qty 2

## 2014-05-22 MED ORDER — IPRATROPIUM-ALBUTEROL 0.5-2.5 (3) MG/3ML IN SOLN
3.0000 mL | Freq: Four times a day (QID) | RESPIRATORY_TRACT | Status: DC
  Administered 2014-05-22 – 2014-05-23 (×5): 3 mL via RESPIRATORY_TRACT
  Filled 2014-05-22 (×4): qty 3

## 2014-05-22 MED ORDER — ATORVASTATIN CALCIUM 20 MG PO TABS
20.0000 mg | ORAL_TABLET | Freq: Every day | ORAL | Status: DC
  Administered 2014-05-22: 20 mg via ORAL
  Filled 2014-05-22: qty 1

## 2014-05-22 MED ORDER — SODIUM CHLORIDE 0.9 % IJ SOLN
3.0000 mL | Freq: Two times a day (BID) | INTRAMUSCULAR | Status: DC
  Administered 2014-05-23: 3 mL via INTRAVENOUS

## 2014-05-22 MED ORDER — HYDROCHLOROTHIAZIDE 25 MG PO TABS
25.0000 mg | ORAL_TABLET | Freq: Every day | ORAL | Status: DC
  Administered 2014-05-22 – 2014-05-23 (×2): 25 mg via ORAL
  Filled 2014-05-22 (×2): qty 1

## 2014-05-22 MED ORDER — ACETAMINOPHEN 650 MG RE SUPP: 650.0000 mg | Freq: Four times a day (QID) | RECTAL | Status: DC | PRN

## 2014-05-22 MED ORDER — ONDANSETRON HCL 4 MG PO TABS: 4.0000 mg | ORAL_TABLET | Freq: Four times a day (QID) | ORAL | Status: DC | PRN

## 2014-05-22 MED ORDER — TRAMADOL HCL 50 MG PO TABS
50.0000 mg | ORAL_TABLET | Freq: Four times a day (QID) | ORAL | Status: DC | PRN
  Administered 2014-05-22: 50 mg via ORAL
  Filled 2014-05-22: qty 1

## 2014-05-22 MED ORDER — ASPIRIN 325 MG PO TABS
325.0000 mg | ORAL_TABLET | Freq: Every day | ORAL | Status: DC
  Administered 2014-05-22 – 2014-05-23 (×2): 325 mg via ORAL
  Filled 2014-05-22 (×2): qty 1

## 2014-05-22 MED ORDER — IPRATROPIUM-ALBUTEROL 0.5-2.5 (3) MG/3ML IN SOLN
3.0000 mL | Freq: Four times a day (QID) | RESPIRATORY_TRACT | Status: DC
Start: 2014-05-22 — End: 2014-05-22

## 2014-05-22 MED ORDER — ALBUTEROL SULFATE (2.5 MG/3ML) 0.083% IN NEBU: 2.5000 mg | INHALATION_SOLUTION | Freq: Four times a day (QID) | RESPIRATORY_TRACT | Status: DC

## 2014-05-22 MED ORDER — ACETAMINOPHEN 325 MG PO TABS: 650.0000 mg | ORAL_TABLET | Freq: Four times a day (QID) | ORAL | Status: DC | PRN

## 2014-05-22 MED ORDER — CLONAZEPAM 0.5 MG PO TABS
1.0000 mg | ORAL_TABLET | Freq: Every day | ORAL | Status: DC
  Administered 2014-05-22: 1 mg via ORAL
  Filled 2014-05-22: qty 2

## 2014-05-22 MED ORDER — ENOXAPARIN SODIUM 40 MG/0.4ML ~~LOC~~ SOLN
40.0000 mg | SUBCUTANEOUS | Status: DC
  Administered 2014-05-22 – 2014-05-23 (×2): 40 mg via SUBCUTANEOUS
  Filled 2014-05-22 (×2): qty 0.4

## 2014-05-22 MED ORDER — ONDANSETRON HCL 4 MG/2ML IJ SOLN: 4.0000 mg | Freq: Four times a day (QID) | INTRAMUSCULAR | Status: DC | PRN

## 2014-05-22 MED ORDER — ALBUTEROL SULFATE (2.5 MG/3ML) 0.083% IN NEBU: 2.5000 mg | INHALATION_SOLUTION | Freq: Four times a day (QID) | RESPIRATORY_TRACT | Status: DC | PRN

## 2014-05-22 MED ORDER — SODIUM CHLORIDE 0.9 % IJ SOLN: 3.0000 mL | INTRAMUSCULAR | Status: DC | PRN

## 2014-05-22 MED ORDER — ALBUTEROL SULFATE (2.5 MG/3ML) 0.083% IN NEBU: 2.5000 mg | INHALATION_SOLUTION | RESPIRATORY_TRACT | Status: DC | PRN

## 2014-05-22 MED ORDER — SODIUM CHLORIDE 0.9 % IV SOLN: 250.0000 mL | INTRAVENOUS | Status: DC | PRN

## 2014-05-22 MED ORDER — PANTOPRAZOLE SODIUM 40 MG PO TBEC
40.0000 mg | DELAYED_RELEASE_TABLET | Freq: Every day | ORAL | Status: DC
  Administered 2014-05-22 – 2014-05-23 (×2): 40 mg via ORAL
  Filled 2014-05-22 (×2): qty 1

## 2014-05-22 MED ORDER — CETYLPYRIDINIUM CHLORIDE 0.05 % MT LIQD
7.0000 mL | Freq: Two times a day (BID) | OROMUCOSAL | Status: DC
  Administered 2014-05-22 – 2014-05-23 (×3): 7 mL via OROMUCOSAL

## 2014-05-22 NOTE — Progress Notes (Signed)
Patient seen and examined. Admitted earlier today for SOb and believed to have a COPD exacerbation. Looks improved, but still SOB. FLu PCR negative. Continue steroids/nebs and empiric levaquin. Will continue to follow.  Domingo Mend, MD [facility] Pager: [phone]

## 2014-05-22 NOTE — H&P (Signed)
PCP:   Delphina Cahill, MD   Chief Complaint:  Shortness of breath  HPI: 81 year old female who   has a past medical history of Cancer; Hypertension; and COPD (chronic obstructive pulmonary disease). today came to the ED with worsening shortness of breath and cough. Patient has COPD and is on home oxygen which she uses at nighttime only. Patient says that she tried nebulizer treatments and takes 5 nebulizer treatments everyday. Today she also had fever. She complains of productive cough with yellow-colored phlegm. She denies chest pain, no nausea vomiting or diarrhea.  Allergies:   Allergies  Allergen Reactions  . Morphine And Related Nausea And Vomiting  . Sulfa Antibiotics Nausea And Vomiting  . Penicillins Rash  . Prednisone Nausea And Vomiting, Anxiety and Other (See Comments)    Makes patient not in right state of mind      Past Medical History  Diagnosis Date  . Cancer     breast  . Hypertension   . COPD (chronic obstructive pulmonary disease)     Past Surgical History  Procedure Laterality Date  . Cardiac catheterization    . Total knee arthroplasty      bilateral  . Total hip arthroplasty      right  . Rotator cuff repair      left  . Mastectomy      right     Prior to Admission medications   Medication Sig Start Date End Date Taking? Authorizing Provider  albuterol (PROVENTIL HFA;VENTOLIN HFA) 108 (90 BASE) MCG/ACT inhaler Inhale 1-2 puffs into the lungs every 6 (six) hours as needed for wheezing or shortness of breath.   Yes Historical Provider, MD  albuterol (PROVENTIL) (2.5 MG/3ML) 0.083% nebulizer solution Take 2.5 mg by nebulization every 6 (six) hours as needed for wheezing or shortness of breath.   Yes Historical Provider, MD  arformoterol (BROVANA) 15 MCG/2ML NEBU Take 15 mcg by nebulization 2 (two) times daily.    Yes Historical Provider, MD  aspirin 325 MG tablet Take 325 mg by mouth daily.   Yes Historical Provider, MD  atorvastatin (LIPITOR) 20 MG  tablet Take 20 mg by mouth.   Yes Historical Provider, MD  budesonide (PULMICORT) 0.25 MG/2ML nebulizer solution Take 0.25 mg by nebulization 2 (two) times daily.    Yes Historical Provider, MD  Calcium Carb-Cholecalciferol (CALCIUM + D3) 600-200 MG-UNIT TABS Take by mouth.   Yes Historical Provider, MD  clonazePAM (KLONOPIN) 1 MG tablet Take 1 mg by mouth at bedtime.    Yes Historical Provider, MD  esomeprazole (NEXIUM) 40 MG capsule Take 40 mg by mouth daily at 12 noon.   Yes Historical Provider, MD  gabapentin (NEURONTIN) 300 MG capsule Take 300 mg by mouth at bedtime.  04/29/14  Yes Historical Provider, MD  losartan-hydrochlorothiazide (HYZAAR) 100-25 MG per tablet Take 1 tablet by mouth.   Yes Historical Provider, MD  montelukast (SINGULAIR) 10 MG tablet Take 10 mg by mouth daily.   Yes Historical Provider, MD  Multiple Vitamin (MULTIVITAMIN WITH MINERALS) TABS tablet Take 1 tablet by mouth daily.   Yes Historical Provider, MD  omeprazole (PRILOSEC) 20 MG capsule Take 20 mg by mouth daily.  04/20/14  Yes Historical Provider, MD  traMADol (ULTRAM) 50 MG tablet Take 50 mg by mouth every 6 (six) hours as needed for moderate pain.  03/12/13  Yes Historical Provider, MD  triamcinolone cream (KENALOG) 0.1 % Apply 1 application topically 2 (two) times daily as needed.    Yes  Historical Provider, MD  venlafaxine (EFFEXOR) 75 MG tablet Take 75 mg by mouth 3 (three) times daily with meals.    Yes Historical Provider, MD  HYDROcodone-acetaminophen (NORCO/VICODIN) 5-325 MG per tablet Take 1 tablet by mouth every 6 (six) hours as needed for moderate pain.    Historical Provider, MD  nitroGLYCERIN (NITROSTAT) 0.4 MG SL tablet Place 0.4 mg under the tongue.    Historical Provider, MD    Social History:  reports that she has never smoked. She has never used smokeless tobacco. She reports that she does not drink alcohol or use illicit drugs.  Family history- patient's brother and sisters had heart  problems  All the positives are listed in BOLD  Review of Systems:  HEENT: Headache, blurred vision, runny nose, sore throat Neck: Hypothyroidism, hyperthyroidism,,lymphadenopathy Chest : Shortness of breath, history of COPD, Asthma Heart : Chest pain, history of coronary arterey disease GI:  Nausea, vomiting, diarrhea, constipation, GERD GU: Dysuria, urgency, frequency of urination, hematuria Neuro: Stroke, seizures, syncope Psych: Depression, anxiety, hallucinations   Physical Exam: Blood pressure 131/47, pulse 88, temperature 98.7 F (37.1 C), temperature source Oral, resp. rate 20, SpO2 100 %. Constitutional:   Patient is a well-developed and well-nourished female in no acute distress and cooperative with exam. Head: Normocephalic and atraumatic Mouth: Mucus membranes moist Eyes: PERRL, EOMI, conjunctivae normal Neck: Supple, No Thyromegaly Cardiovascular: RRR, S1 normal, S2 normal Pulmonary/Chest: Decreased breath sounds at lung bases Abdominal: Soft. Non-tender, non-distended, bowel sounds are normal, no masses, organomegaly, or guarding present.  Neurological: A&O x3, Strength is normal and symmetric bilaterally, cranial nerve II-XII are grossly intact, no focal motor deficit, sensory intact to light touch bilaterally.  Extremities : No Cyanosis, Clubbing or Edema  Labs on Admission:  Basic Metabolic Panel:  Recent Labs Lab 05/21/14 2332  NA 137  K 3.8  CL 100  CO2 31  GLUCOSE 89  BUN 15  CREATININE 0.97  CALCIUM 9.0   Liver Function Tests: No results for input(s): AST, ALT, ALKPHOS, BILITOT, PROT, ALBUMIN in the last 168 hours. No results for input(s): LIPASE, AMYLASE in the last 168 hours. No results for input(s): AMMONIA in the last 168 hours. CBC:  Recent Labs Lab 05/21/14 2332  WBC 4.8  NEUTROABS 3.6  HGB 10.5*  HCT 32.8*  MCV 95.1  PLT 202   Cardiac Enzymes:  Recent Labs Lab 05/21/14 2332  TROPONINI <0.03    BNP (last 3  results)  Recent Labs  05/21/14 2332  BNP 67.0    ProBNP (last 3 results) No results for input(s): PROBNP in the last 8760 hours.  CBG: No results for input(s): GLUCAP in the last 168 hours.  Radiological Exams on Admission: Dg Chest 2 View  05/22/2014   CLINICAL DATA:  Acute onset of shortness of breath and congestion, with persistent cough. Initial encounter.  EXAM: CHEST  2 VIEW  COMPARISON:  None.  FINDINGS: The lungs are well-aerated. The lungs are hyperexpanded, with flattening of the hemidiaphragms, likely reflecting COPD. There is no evidence of focal opacification, pleural effusion or pneumothorax.  The heart is normal in size; the mediastinal contour is within normal limits. No acute osseous abnormalities are seen.  IMPRESSION: Findings of COPD.  No acute cardiopulmonary process seen.   Electronically Signed   By: Garald Balding M.D.   On: 05/22/2014 01:27    EKG: Independently reviewed. Sinus rhythm   Assessment/Plan Principal Problem:   COPD exacerbation Active Problems:   COPD (chronic obstructive pulmonary  disease)   HTN (hypertension)  COPD exacerbation We'll admit the patient and start Solu-Medrol 60 mg IV every 6 hours, DuoNeb nebulizers every 6 hours, Levaquin.  Hypertension Blood pressure is stable, continue home medication  GERD Continue PPI  DVT prophylaxis Lovenox   Code status: DO NOT RESUSCITATE   [email or web address] discussion: Admission, patients condition and plan of care including tests being ordered have been discussed with the patient and her nephew at bedside* who indicate understanding and agree with the plan and Code Status.   Time Spent on Admission 60 minutes  [facility] Pager: [phone] 05/22/2014, 4:08 AM  If 7PM-7AM, please contact night-coverage  [email or web address]  Password [email or web address]

## 2014-05-22 NOTE — Progress Notes (Signed)
Unable to star nebs due to name not in [facility]

## 2014-05-23 DIAGNOSIS — I10 Essential (primary) hypertension: Secondary | ICD-10-CM

## 2014-05-23 MED ORDER — PREDNISONE 10 MG PO TABS: 10.0000 mg | ORAL_TABLET | Freq: Every day | ORAL | Status: DC

## 2014-05-23 MED ORDER — LEVOFLOXACIN 500 MG PO TABS: 500.0000 mg | ORAL_TABLET | Freq: Every day | ORAL | Status: DC

## 2014-05-23 NOTE — Progress Notes (Signed)
UR chart review completed.  

## 2014-05-23 NOTE — Care Management Note (Signed)
    Page 1 of 1   05/23/2014     2:37:04 PM CARE MANAGEMENT NOTE 05/23/2014  Patient:  Amanda Palmer, Amanda Palmer   Account Number:  [id number]  Date Initiated:  05/23/2014  Documentation initiated by:  Theophilus Kinds  Subjective/Objective Assessment:   Pt admitted from home with COPD. Pt lives alone and is going to stay with her sisterat discharge. Pt is independent with ADL's. Pt has home O2 and neb machine for home use. Pt uses home O2 only at night.     Action/Plan:   Pt for discharge home today. No CM needs noted.   Anticipated DC Date:  05/23/2014   Anticipated DC Plan:  [place]  CM consult      Choice offered to / List presented to:             Status of service:  Completed, signed off Medicare Important Message given?  NA - LOS <3 / Initial given by admissions (If response is "NO", the following Medicare IM given date fields will be blank) Date Medicare IM given:   Medicare IM given by:   Date Additional Medicare IM given:   Additional Medicare IM given by:    Discharge Disposition:  HOME/SELF CARE  Per UR Regulation:    If discussed at Long Length of Stay Meetings, dates discussed:    Comments:  05/23/14 [place], RN BSN CM

## 2014-05-23 NOTE — Discharge Summary (Signed)
Physician Discharge Summary  Amanda Palmer EXH:[id number] DOB: 04-23-1933 DOA: 05/21/2014  PCP: Delphina Cahill, MD  Admit date: 05/21/2014 Discharge date: 05/23/2014  Time spent: 45 minutes  Recommendations for Outpatient Follow-up:  -Will be discharged home today. -Advised to follow up with PCP in 2 weeks.   Discharge Diagnoses:  Principal Problem:   COPD exacerbation Active Problems:   COPD (chronic obstructive pulmonary disease)   HTN (hypertension)   Discharge Condition: Stable and improved  Filed Weights   05/22/14 0608  Weight: 66.316 kg (146 lb 3.2 oz)    History of present illness:  81 year old female who  has a past medical history of Cancer; Hypertension; and COPD (chronic obstructive pulmonary disease). today came to the ED with worsening shortness of breath and cough. Patient has COPD and is on home oxygen which she uses at nighttime only. Patient says that she tried nebulizer treatments and takes 5 nebulizer treatments everyday. Today she also had fever. She complains of productive cough with yellow-colored phlegm. She denies chest pain, no nausea vomiting or diarrhea.  Hospital Course:   COPD with Acute Exacerbation -Improved clinically. -No wheezing on exam. -Patient has ambulated without issues today. -She is anxious to go home. -States she has an intolerance to prednisone (makes her nervous) and refuses to take a steroid taper upon DC.  HTN -Well controlled. -Continue home medications..  Procedures:  None   Consultations:  None  Discharge Instructions  Discharge Instructions    Diet - low sodium heart healthy    Complete by:  As directed      Increase activity slowly    Complete by:  As directed             Medication List    TAKE these medications        albuterol 108 (90 BASE) MCG/ACT inhaler  Commonly known as:  PROVENTIL HFA;VENTOLIN HFA  Inhale 1-2 puffs into the lungs every 6 (six) hours as needed for wheezing or shortness  of breath.     albuterol (2.5 MG/3ML) 0.083% nebulizer solution  Commonly known as:  PROVENTIL  Take 2.5 mg by nebulization every 6 (six) hours as needed for wheezing or shortness of breath.     arformoterol 15 MCG/2ML Nebu  Commonly known as:  BROVANA  Take 15 mcg by nebulization 2 (two) times daily.  Notes to Patient:  Resume medication as ordered     aspirin 325 MG tablet  Take 325 mg by mouth daily.     atorvastatin 20 MG tablet  Commonly known as:  LIPITOR  Take 20 mg by mouth.     budesonide 0.25 MG/2ML nebulizer solution  Commonly known as:  PULMICORT  Take 0.25 mg by nebulization 2 (two) times daily.  Notes to Patient:  Resume medication as ordered     Calcium + D3 600-200 MG-UNIT Tabs  Take by mouth.     clonazePAM 1 MG tablet  Commonly known as:  KLONOPIN  Take 1 mg by mouth at bedtime.     esomeprazole 40 MG capsule  Commonly known as:  NEXIUM  Take 40 mg by mouth daily at 12 noon.     gabapentin 300 MG capsule  Commonly known as:  NEURONTIN  Take 300 mg by mouth at bedtime.     HYDROcodone-acetaminophen 5-325 MG per tablet  Commonly known as:  NORCO/VICODIN  Take 1 tablet by mouth every 6 (six) hours as needed for moderate pain.     levofloxacin 500  MG tablet  Commonly known as:  LEVAQUIN  Take 1 tablet (500 mg total) by mouth daily.  Notes to Patient:  Take medication as ordered *new prescription*     losartan-hydrochlorothiazide 100-25 MG per tablet  Commonly known as:  HYZAAR  Take 1 tablet by mouth.     montelukast 10 MG tablet  Commonly known as:  SINGULAIR  Take 10 mg by mouth daily.     multivitamin with minerals Tabs tablet  Take 1 tablet by mouth daily.     nitroGLYCERIN 0.4 MG SL tablet  Commonly known as:  NITROSTAT  Place 0.4 mg under the tongue.     omeprazole 20 MG capsule  Commonly known as:  PRILOSEC  Take 20 mg by mouth daily.     predniSONE 10 MG tablet  Commonly known as:  DELTASONE  Take 1 tablet (10 mg total) by  mouth daily with breakfast. Take 6 tablets today and then decrease by 1 tablet daily until none are left.  Notes to Patient:  Take medication as ordered/follow instructions on label     traMADol 50 MG tablet  Commonly known as:  ULTRAM  Take 50 mg by mouth every 6 (six) hours as needed for moderate pain.     triamcinolone cream 0.1 %  Commonly known as:  KENALOG  Apply 1 application topically 2 (two) times daily as needed.     venlafaxine 75 MG tablet  Commonly known as:  EFFEXOR  Take 75 mg by mouth 3 (three) times daily with meals.       Allergies  Allergen Reactions  . Morphine And Related Nausea And Vomiting  . Sulfa Antibiotics Nausea And Vomiting  . Penicillins Rash  . Prednisone Nausea And Vomiting, Anxiety and Other (See Comments)    Makes patient not in right state of mind       Follow-up Information    Follow up with Delphina Cahill, MD. Schedule an appointment as soon as possible for a visit in 2 weeks.   Specialty:  Internal Medicine   Contact information:    Home [phone]        The results of significant diagnostics from this hospitalization (including imaging, microbiology, ancillary and laboratory) are listed below for reference.    Significant Diagnostic Studies: Dg Chest 2 View  05/22/2014   CLINICAL DATA:  Acute onset of shortness of breath and congestion, with persistent cough. Initial encounter.  EXAM: CHEST  2 VIEW  COMPARISON:  None.  FINDINGS: The lungs are well-aerated. The lungs are hyperexpanded, with flattening of the hemidiaphragms, likely reflecting COPD. There is no evidence of focal opacification, pleural effusion or pneumothorax.  The heart is normal in size; the mediastinal contour is within normal limits. No acute osseous abnormalities are seen.  IMPRESSION: Findings of COPD.  No acute cardiopulmonary process seen.   Electronically Signed   By: Garald Balding M.D.   On: 05/22/2014 01:27     Microbiology: No results found for this or any previous visit (from the past 240 hour(s)).   Labs: Basic Metabolic Panel:  Recent Labs Lab 05/21/14 2332 05/22/14 0606  NA 137 137  K 3.8 3.4*  CL 100 100  CO2 31 31  GLUCOSE 89 117*  BUN 15 15  CREATININE 0.97 0.84  CALCIUM 9.0 8.8   Liver Function Tests:  Recent Labs Lab 05/22/14 0606  AST 18  ALT 13  ALKPHOS 72  BILITOT 0.3  PROT 6.0  ALBUMIN 3.2*   No results for input(s): LIPASE, AMYLASE in the last 168 hours. No results for input(s): AMMONIA in the last 168 hours. CBC:  Recent Labs Lab 05/21/14 2332 05/22/14 0606  WBC 4.8 4.8  NEUTROABS 3.6  --   HGB 10.5* 9.9*  HCT 32.8* 31.0*  MCV 95.1 94.8  PLT 202 181   Cardiac Enzymes:  Recent Labs Lab 05/21/14 2332  TROPONINI <0.03   BNP: BNP (last 3 results)  Recent Labs  05/21/14 2332  BNP 67.0    ProBNP (last 3 results) No results for input(s): PROBNP in the last 8760 hours.  CBG: No results for input(s): GLUCAP in the last 168 hours.     SignedLelon Frohlich  [facility] Pager: [phone] 05/23/2014, 6:05 PM

## 2014-05-23 NOTE — Progress Notes (Signed)
Reviewed discharge instructions and prescriptions with pt, opportunity to ask questions allowed.  Pt IV removed, pt tolerated well.  Will continue to montior pt until leave the floor.

## 2014-05-30 ENCOUNTER — Inpatient Hospital Stay (HOSPITAL_COMMUNITY)
Admission: EM | Admit: 2014-05-30 | Discharge: 2014-06-03 | DRG: 493 | Disposition: A | Discharge status: Skilled Nursing Facility | Payer: Medicare Other | Attending: Internal Medicine | Admitting: Internal Medicine

## 2014-05-30 ENCOUNTER — Encounter (HOSPITAL_COMMUNITY): Payer: Self-pay | Admitting: Emergency Medicine

## 2014-05-30 ENCOUNTER — Emergency Department (HOSPITAL_COMMUNITY): Payer: Medicare Other

## 2014-05-30 DIAGNOSIS — N179 Acute kidney failure, unspecified: Secondary | ICD-10-CM | POA: Diagnosis not present

## 2014-05-30 DIAGNOSIS — T148 Other injury of unspecified body region: Secondary | ICD-10-CM | POA: Diagnosis not present

## 2014-05-30 DIAGNOSIS — D62 Acute posthemorrhagic anemia: Secondary | ICD-10-CM | POA: Diagnosis not present

## 2014-05-30 DIAGNOSIS — S82852A Displaced trimalleolar fracture of left lower leg, initial encounter for closed fracture: Secondary | ICD-10-CM | POA: Diagnosis not present

## 2014-05-30 DIAGNOSIS — R55 Syncope and collapse: Secondary | ICD-10-CM | POA: Diagnosis present

## 2014-05-30 DIAGNOSIS — Z9981 Dependence on supplemental oxygen: Secondary | ICD-10-CM | POA: Diagnosis not present

## 2014-05-30 DIAGNOSIS — S0101XA Laceration without foreign body of scalp, initial encounter: Secondary | ICD-10-CM

## 2014-05-30 DIAGNOSIS — W1830XA Fall on same level, unspecified, initial encounter: Secondary | ICD-10-CM | POA: Diagnosis present

## 2014-05-30 DIAGNOSIS — S0990XA Unspecified injury of head, initial encounter: Secondary | ICD-10-CM

## 2014-05-30 DIAGNOSIS — S098XXA Other specified injuries of head, initial encounter: Secondary | ICD-10-CM | POA: Diagnosis not present

## 2014-05-30 DIAGNOSIS — R0602 Shortness of breath: Secondary | ICD-10-CM | POA: Diagnosis not present

## 2014-05-30 DIAGNOSIS — Z882 Allergy status to sulfonamides status: Secondary | ICD-10-CM

## 2014-05-30 DIAGNOSIS — Z7982 Long term (current) use of aspirin: Secondary | ICD-10-CM

## 2014-05-30 DIAGNOSIS — J9610 Chronic respiratory failure, unspecified whether with hypoxia or hypercapnia: Secondary | ICD-10-CM | POA: Diagnosis present

## 2014-05-30 DIAGNOSIS — Z96641 Presence of right artificial hip joint: Secondary | ICD-10-CM | POA: Diagnosis present

## 2014-05-30 DIAGNOSIS — Z96653 Presence of artificial knee joint, bilateral: Secondary | ICD-10-CM | POA: Diagnosis present

## 2014-05-30 DIAGNOSIS — Z885 Allergy status to narcotic agent status: Secondary | ICD-10-CM

## 2014-05-30 DIAGNOSIS — Z79899 Other long term (current) drug therapy: Secondary | ICD-10-CM

## 2014-05-30 DIAGNOSIS — T148XXA Other injury of unspecified body region, initial encounter: Secondary | ICD-10-CM

## 2014-05-30 DIAGNOSIS — Z888 Allergy status to other drugs, medicaments and biological substances status: Secondary | ICD-10-CM

## 2014-05-30 DIAGNOSIS — R911 Solitary pulmonary nodule: Secondary | ICD-10-CM | POA: Diagnosis present

## 2014-05-30 DIAGNOSIS — I10 Essential (primary) hypertension: Secondary | ICD-10-CM | POA: Diagnosis present

## 2014-05-30 DIAGNOSIS — W228XXA Striking against or struck by other objects, initial encounter: Secondary | ICD-10-CM | POA: Diagnosis present

## 2014-05-30 DIAGNOSIS — R059 Cough, unspecified: Secondary | ICD-10-CM

## 2014-05-30 DIAGNOSIS — S8991XA Unspecified injury of right lower leg, initial encounter: Secondary | ICD-10-CM | POA: Diagnosis not present

## 2014-05-30 DIAGNOSIS — R05 Cough: Secondary | ICD-10-CM | POA: Diagnosis not present

## 2014-05-30 DIAGNOSIS — S199XXA Unspecified injury of neck, initial encounter: Secondary | ICD-10-CM | POA: Diagnosis not present

## 2014-05-30 DIAGNOSIS — Z853 Personal history of malignant neoplasm of breast: Secondary | ICD-10-CM

## 2014-05-30 DIAGNOSIS — M25572 Pain in left ankle and joints of left foot: Secondary | ICD-10-CM | POA: Diagnosis not present

## 2014-05-30 DIAGNOSIS — Z9012 Acquired absence of left breast and nipple: Secondary | ICD-10-CM | POA: Diagnosis present

## 2014-05-30 DIAGNOSIS — J449 Chronic obstructive pulmonary disease, unspecified: Secondary | ICD-10-CM | POA: Diagnosis present

## 2014-05-30 DIAGNOSIS — I739 Peripheral vascular disease, unspecified: Secondary | ICD-10-CM | POA: Diagnosis present

## 2014-05-30 DIAGNOSIS — Z419 Encounter for procedure for purposes other than remedying health state, unspecified: Secondary | ICD-10-CM

## 2014-05-30 DIAGNOSIS — J9811 Atelectasis: Secondary | ICD-10-CM | POA: Diagnosis not present

## 2014-05-30 DIAGNOSIS — S82853A Displaced trimalleolar fracture of unspecified lower leg, initial encounter for closed fracture: Secondary | ICD-10-CM | POA: Diagnosis present

## 2014-05-30 DIAGNOSIS — E785 Hyperlipidemia, unspecified: Secondary | ICD-10-CM | POA: Diagnosis present

## 2014-05-30 DIAGNOSIS — Z880 Allergy status to penicillin: Secondary | ICD-10-CM

## 2014-05-30 DIAGNOSIS — K219 Gastro-esophageal reflux disease without esophagitis: Secondary | ICD-10-CM | POA: Diagnosis present

## 2014-05-30 DIAGNOSIS — I679 Cerebrovascular disease, unspecified: Secondary | ICD-10-CM | POA: Diagnosis present

## 2014-05-30 DIAGNOSIS — S9302XA Subluxation of left ankle joint, initial encounter: Secondary | ICD-10-CM | POA: Diagnosis not present

## 2014-05-30 DIAGNOSIS — I959 Hypotension, unspecified: Secondary | ICD-10-CM | POA: Diagnosis not present

## 2014-05-30 MED ORDER — SODIUM CHLORIDE 0.9 % IV BOLUS (SEPSIS)
500.0000 mL | Freq: Once | INTRAVENOUS | Status: AC
  Administered 2014-05-30: 500 mL via INTRAVENOUS

## 2014-05-30 NOTE — ED Notes (Signed)
Patient has a hematoma with some blood present to the back of her head.

## 2014-05-30 NOTE — ED Provider Notes (Signed)
CSN: [id number]     Arrival date & time 05/30/14  2323 History  This chart was scribed for Julianne Rice, MD by Molli Posey, ED Scribe. This patient was seen in room D34C/D34C and the patient's care was started 11:35 PM.    Chief Complaint  Patient presents with  . Fall  . Ankle Pain  . Loss of Consciousness    Hit head during fall and lost consciousness with mild neck pain    HPI HPI Comments: Amanda Palmer is a 81 y.o. female with a history of HTN and COPD who presents to the Emergency Department complaining of a fall that occurred PTA. Pt states she was adjusting the thermostat and says she could feel herself beginning to pass and states that she thinks she experienced a LOC. Pt reports that she hit her head during the fall and complains of left ankle pain at this time. Pt also complains of worsening neck pain at this time. She states that she is UTD on her tetanus. Pt reports she is on 5 breathing treatments a day for her COPD and is on 2L of oxygen at home. She states that she was hospitalized at [facility] 2 weeks ago for a COPD exacerbation. She reports that she takes aspirin 325mg . Pt denies any left knee pain, right leg pain and cough.   Past Medical History  Diagnosis Date  . Cancer     breast  . Hypertension   . COPD (chronic obstructive pulmonary disease)    Past Surgical History  Procedure Laterality Date  . Cardiac catheterization    . Total knee arthroplasty      bilateral  . Total hip arthroplasty      right  . Rotator cuff repair      left  . Mastectomy      right    History reviewed. No pertinent family history. History  Substance Use Topics  . Smoking status: Never Smoker   . Smokeless tobacco: Never Used  . Alcohol Use: No   OB History    No data available     Review of Systems  Constitutional: Negative for fever and chills.  Respiratory: Negative for shortness of breath.   Cardiovascular: Negative for chest pain.  Gastrointestinal: Negative  for nausea, vomiting and abdominal pain.  Genitourinary: Negative for dysuria, frequency and flank pain.  Musculoskeletal: Positive for arthralgias and neck pain. Negative for back pain.  Skin: Positive for wound. Negative for rash.  Neurological: Positive for dizziness, syncope and light-headedness. Negative for weakness, numbness and headaches.  All other systems reviewed and are negative.     Allergies  Morphine and related; Sulfa antibiotics; Penicillins; and Prednisone  Home Medications   Prior to Admission medications   Medication Sig Start Date End Date Taking? Authorizing Provider  albuterol (PROVENTIL HFA;VENTOLIN HFA) 108 (90 BASE) MCG/ACT inhaler Inhale 1-2 puffs into the lungs every 6 (six) hours as needed for wheezing or shortness of breath.   Yes Historical Provider, MD  albuterol (PROVENTIL) (2.5 MG/3ML) 0.083% nebulizer solution Take 2.5 mg by nebulization every 6 (six) hours as needed for wheezing or shortness of breath.   Yes Historical Provider, MD  arformoterol (BROVANA) 15 MCG/2ML NEBU Take 15 mcg by nebulization 2 (two) times daily.    Yes Historical Provider, MD  aspirin 325 MG tablet Take 325 mg by mouth daily.   Yes Historical Provider, MD  atorvastatin (LIPITOR) 20 MG tablet Take 20 mg by mouth every evening.  Yes Historical Provider, MD  budesonide (PULMICORT) 0.25 MG/2ML nebulizer solution Take 0.25 mg by nebulization 2 (two) times daily.    Yes Historical Provider, MD  Calcium Carb-Cholecalciferol (CALCIUM + D3) 600-200 MG-UNIT TABS Take 1 tablet by mouth 2 (two) times daily.    Yes Historical Provider, MD  clonazePAM (KLONOPIN) 1 MG tablet Take 1 mg by mouth at bedtime.    Yes Historical Provider, MD  esomeprazole (NEXIUM) 40 MG capsule Take 40 mg by mouth daily at 12 noon.   Yes Historical Provider, MD  gabapentin (NEURONTIN) 300 MG capsule Take 300 mg by mouth at bedtime.  04/29/14  Yes Historical Provider, MD  HYDROcodone-acetaminophen (NORCO/VICODIN)  5-325 MG per tablet Take 1 tablet by mouth every 6 (six) hours as needed for moderate pain.   Yes Historical Provider, MD  losartan-hydrochlorothiazide (HYZAAR) 100-25 MG per tablet Take 1 tablet by mouth daily.    Yes Historical Provider, MD  montelukast (SINGULAIR) 10 MG tablet Take 10 mg by mouth daily.   Yes Historical Provider, MD  Multiple Vitamin (MULTIVITAMIN WITH MINERALS) TABS tablet Take 1 tablet by mouth daily.   Yes Historical Provider, MD  nitroGLYCERIN (NITROSTAT) 0.4 MG SL tablet Place 0.4 mg under the tongue every 5 (five) minutes as needed for chest pain.    Yes Historical Provider, MD  omeprazole (PRILOSEC) 20 MG capsule Take 20 mg by mouth daily.  04/20/14  Yes Historical Provider, MD  traMADol (ULTRAM) 50 MG tablet Take 50 mg by mouth every 6 (six) hours as needed for moderate pain.  03/12/13  Yes Historical Provider, MD  triamcinolone cream (KENALOG) 0.1 % Apply 1 application topically 2 (two) times daily as needed (rash).    Yes Historical Provider, MD  venlafaxine (EFFEXOR) 75 MG tablet Take 75 mg by mouth 3 (three) times daily with meals.    Yes Historical Provider, MD  levofloxacin (LEVAQUIN) 500 MG tablet Take 1 tablet (500 mg total) by mouth daily. Patient not taking: Reported on 05/31/2014 05/23/14   Erline Hau, MD  predniSONE (DELTASONE) 10 MG tablet Take 1 tablet (10 mg total) by mouth daily with breakfast. Take 6 tablets today and then decrease by 1 tablet daily until none are left. Patient not taking: Reported on 05/31/2014 05/23/14   Erline Hau, MD   BP 96/40 mmHg  Pulse 78  Temp(Src) 97.8 F (36.6 C) (Oral)  Resp 17  Ht 5\' 4"  (1.626 m)  Wt 144 lb (65.318 kg)  BMI 24.71 kg/m2  SpO2 100% Physical Exam  Constitutional: She is oriented to person, place, and time. She appears well-developed and well-nourished. No distress.  HENT:  Head: Normocephalic.  Mouth/Throat: Oropharynx is clear and moist.  1 cm laceration to the occipital scalp.  No active bleeding.  Eyes: EOM are normal. Pupils are equal, round, and reactive to light. Right eye exhibits no discharge. Left eye exhibits no discharge.  Neck:  Cervical collar in place.  Cardiovascular: Normal rate and regular rhythm.   Pulmonary/Chest: Effort normal and breath sounds normal. No respiratory distress. She has no wheezes. She has no rales.  Abdominal: Soft. Bowel sounds are normal. She exhibits no distension and no mass. There is no tenderness. There is no rebound and no guarding.  Musculoskeletal: Normal range of motion. She exhibits no edema or tenderness.  Left ankle dislocation. Posterior tibial and dorsalis pedis pulses intact. Full range of motion of the right lower sugary.  Neurological: She is alert and oriented to person,  place, and time.  Sensation fully intact. Moves all extremities without deficit  Skin: Skin is warm and dry. No rash noted. No erythema.  Psychiatric: She has a normal mood and affect. Her behavior is normal.  Nursing note and vitals reviewed.   ED Course  Reduction of dislocation Date/Time: 05/31/2014 12:21 AM Performed by: Julianne Rice Authorized by: Lita Mains, Alexcis Bicking Consent: The procedure was performed in an emergent situation. Verbal consent obtained. Time out: Immediately prior to procedure a "time out" was called to verify the correct patient, procedure, equipment, support staff and site/side marked as required. Patient sedated: yes Sedatives: fentanyl and midazolam Sedation start date/time: 05/31/2014 12:21 AM Sedation end date/time: 05/31/2014 12:29 AM Vitals: Vital signs were monitored during sedation. Patient tolerance: Patient tolerated the procedure well with no immediate complications Comments: Left ankle reduction with posterior splint placed. She tolerated well.    DIAGNOSTIC STUDIES: Oxygen Saturation is 93% on RA, normal by my interpretation.    COORDINATION OF CARE: 11:40 PM Discussed treatment plan with pt at  bedside and pt agreed to plan.   Labs Review Labs Reviewed  CBC WITH DIFFERENTIAL/PLATELET - Abnormal; Notable for the following:    WBC 12.2 (*)    Neutro Abs 9.4 (*)    All other components within normal limits  COMPREHENSIVE METABOLIC PANEL - Abnormal; Notable for the following:    Chloride 93 (*)    CO2 34 (*)    Glucose, Bld 156 (*)    BUN 27 (*)    Creatinine, Ser 1.51 (*)    Total Protein 5.9 (*)    Albumin 3.4 (*)    GFR calc non Af Amer 32 (*)    GFR calc Af Amer 37 (*)    All other components within normal limits  URINALYSIS, ROUTINE W REFLEX MICROSCOPIC - Abnormal; Notable for the following:    APPearance CLOUDY (*)    Bilirubin Urine SMALL (*)    Ketones, ur 15 (*)    All other components within normal limits    Imaging Review Dg Knee 2 Views Right  05/31/2014   CLINICAL DATA:  [place]  EXAM: RIGHT KNEE - 1-2 VIEW  COMPARISON:  None.  FINDINGS: There are intact appearances of the total knee arthroplasty. There is no fracture or dislocation. There is no soft tissue foreign body.  IMPRESSION: Negative.   Electronically Signed   By: Andreas Newport M.D.   On: 05/31/2014 01:58   Dg Ankle Complete Left  05/31/2014   CLINICAL DATA:  [place]  EXAM: LEFT ANKLE COMPLETE - 3+ VIEW  COMPARISON:  None.  FINDINGS: Three views of the left ankle obtained through fiberglass demonstrate an oblique distal fibular fracture with a little more than 1 cortex width posterolateral displacement. There is a transverse medial malleolar fracture with moderate distraction. There is a vertical posterior malleolar fracture with 1 cortex width displacement. There is disruption of the mortise with a symmetric medial widening and mild valgus.  IMPRESSION: Trimalleolar fracture.  Mild valgus.   Electronically Signed   By: Andreas Newport M.D.   On: 05/31/2014 02:00   Ct Head Wo Contrast  05/31/2014   CLINICAL DATA:  Initial evaluation for acute trauma, fall.  EXAM: CT HEAD WITHOUT CONTRAST  CT CERVICAL  SPINE WITHOUT CONTRAST  TECHNIQUE: Multidetector CT imaging of the head and cervical spine was performed following the standard protocol without intravenous contrast. Multiplanar CT image reconstructions of the cervical spine were also generated.  COMPARISON:  None.  FINDINGS: CT  HEAD FINDINGS  Diffuse prominence of the CSF containing spaces compatible with generalized cerebral atrophy. Patchy and confluent hypodensity within the periventricular and deep white matter both cerebral hemispheres most consistent with chronic small vessel ischemic changes.  No acute intracranial hemorrhage or large vessel territory infarct. No extra-axial fluid collection. No mass lesion or midline shift. No hydrocephalus.  Scalp soft tissues within normal limits.  Calvarium intact.  No acute abnormality seen about the orbits.  Scattered mucoperiosteal thickening present on about the ethmoidal air cells as well as the right maxillary and sphenoid sinuses. No mastoid effusion.  CT CERVICAL SPINE FINDINGS  There is trace anterior listhesis of C4 on C5, C5 on C6, and C6 on C7 Facet arthrosis at these levels suggest that this is chronic in nature. No fracture or acute listhesis. Normal C1-2 articulations intact. Vertebral body heights preserved.  Prevertebral soft tissues normal.  Mild multilevel degenerative disc disease is evidenced by intervertebral disc space narrowing and endplate osteophytosis is present, most prevalent at C4-5.  No acute soft tissue abnormality within the neck. Partially visualized lungs are clear. 5 mm left upper lobe nodule noted.  IMPRESSION: CT BRAIN:  1. No acute intracranial process. 2. Atrophy with chronic microvascular ischemic disease.  CT CERVICAL SPINE:  1. No acute traumatic injury within the cervical spine. 2. Chronic degenerative anterolisthesis of C4 on C5, C5 on C6 common C6 on C7. 3. 5 mm left upper lobe nodule, indeterminate. If the patient is at high risk for bronchogenic carcinoma, follow-up chest  CT at 6-12 months is recommended. If the patient is at low risk for bronchogenic carcinoma, follow-up chest CT at 12 months is recommended. This recommendation follows the consensus statement: Guidelines for Management of Small Pulmonary Nodules Detected on CT Scans: A Statement from the [employer] as published in Radiology 2005;237:395-400.   Electronically Signed   By: Jeannine Boga M.D.   On: 05/31/2014 02:21   Ct Cervical Spine Wo Contrast  05/31/2014   CLINICAL DATA:  Initial evaluation for acute trauma, fall.  EXAM: CT HEAD WITHOUT CONTRAST  CT CERVICAL SPINE WITHOUT CONTRAST  TECHNIQUE: Multidetector CT imaging of the head and cervical spine was performed following the standard protocol without intravenous contrast. Multiplanar CT image reconstructions of the cervical spine were also generated.  COMPARISON:  None.  FINDINGS: CT HEAD FINDINGS  Diffuse prominence of the CSF containing spaces compatible with generalized cerebral atrophy. Patchy and confluent hypodensity within the periventricular and deep white matter both cerebral hemispheres most consistent with chronic small vessel ischemic changes.  No acute intracranial hemorrhage or large vessel territory infarct. No extra-axial fluid collection. No mass lesion or midline shift. No hydrocephalus.  Scalp soft tissues within normal limits.  Calvarium intact.  No acute abnormality seen about the orbits.  Scattered mucoperiosteal thickening present on about the ethmoidal air cells as well as the right maxillary and sphenoid sinuses. No mastoid effusion.  CT CERVICAL SPINE FINDINGS  There is trace anterior listhesis of C4 on C5, C5 on C6, and C6 on C7 Facet arthrosis at these levels suggest that this is chronic in nature. No fracture or acute listhesis. Normal C1-2 articulations intact. Vertebral body heights preserved.  Prevertebral soft tissues normal.  Mild multilevel degenerative disc disease is evidenced by intervertebral disc space  narrowing and endplate osteophytosis is present, most prevalent at C4-5.  No acute soft tissue abnormality within the neck. Partially visualized lungs are clear. 5 mm left upper lobe nodule noted.  IMPRESSION: CT BRAIN:  1. No acute intracranial process. 2. Atrophy with chronic microvascular ischemic disease.  CT CERVICAL SPINE:  1. No acute traumatic injury within the cervical spine. 2. Chronic degenerative anterolisthesis of C4 on C5, C5 on C6 common C6 on C7. 3. 5 mm left upper lobe nodule, indeterminate. If the patient is at high risk for bronchogenic carcinoma, follow-up chest CT at 6-12 months is recommended. If the patient is at low risk for bronchogenic carcinoma, follow-up chest CT at 12 months is recommended. This recommendation follows the consensus statement: Guidelines for Management of Small Pulmonary Nodules Detected on CT Scans: A Statement from the [employer] as published in Radiology 2005;237:395-400.   Electronically Signed   By: Jeannine Boga M.D.   On: 05/31/2014 02:21   Dg Chest Portable 1 View  05/31/2014   CLINICAL DATA:  Status post fall. Shortness of breath and cough. Initial encounter.  EXAM: PORTABLE CHEST - 1 VIEW  COMPARISON:  Chest radiograph performed 05/22/2014  FINDINGS: The lungs are well-aerated. Minimal bibasilar atelectasis is noted. There is no evidence of pleural effusion or pneumothorax.  The cardiomediastinal silhouette is within normal limits. No acute osseous abnormalities are seen. Subcortical cystic changes are seen at both humeral heads, with superior subluxation of the left humeral head, reflecting underlying chronic rotator cuff tear.  IMPRESSION: Minimal bibasilar atelectasis noted; lungs otherwise clear. No displaced rib fracture seen.   Electronically Signed   By: Garald Balding M.D.   On: 05/31/2014 00:32     EKG Interpretation None      MDM   Final diagnoses:  Dislocation closed  Trimalleolar fracture of ankle, closed, left, initial  encounter  Syncope and collapse  Closed head injury, initial encounter  Scalp laceration, initial encounter    I personally performed the services described in this documentation, which was scribed in my presence. The recorded information has been reviewed and is accurate.  Discussed with Dr. Lorin Mercy who will see the patient in the morning. Dr. Hal Hope will admit to telemetry bed.     Julianne Rice, MD 05/31/14 [phone]

## 2014-05-30 NOTE — ED Notes (Signed)
Patient presents via EMS on stretcher with neck collar/immobilazation in place.  on due to a fall tonight at 2150 with a hit to head and LOC, discoloration and inappropriate positioning of left ankle. C/O pain in neck at scene with EMS but states neck pain at arrival of ED is 1/10 and ankle pain is a 5/10.

## 2014-05-30 NOTE — ED Notes (Signed)
MD at bedside. 

## 2014-05-31 ENCOUNTER — Emergency Department (HOSPITAL_COMMUNITY): Payer: Medicare Other

## 2014-05-31 ENCOUNTER — Encounter (HOSPITAL_COMMUNITY): Payer: Self-pay

## 2014-05-31 ENCOUNTER — Other Ambulatory Visit (HOSPITAL_COMMUNITY): Payer: Self-pay

## 2014-05-31 ENCOUNTER — Other Ambulatory Visit (HOSPITAL_COMMUNITY): Payer: No Typology Code available for payment source

## 2014-05-31 DIAGNOSIS — Z885 Allergy status to narcotic agent status: Secondary | ICD-10-CM | POA: Diagnosis not present

## 2014-05-31 DIAGNOSIS — R911 Solitary pulmonary nodule: Secondary | ICD-10-CM | POA: Diagnosis not present

## 2014-05-31 DIAGNOSIS — I10 Essential (primary) hypertension: Secondary | ICD-10-CM | POA: Diagnosis not present

## 2014-05-31 DIAGNOSIS — Z96641 Presence of right artificial hip joint: Secondary | ICD-10-CM | POA: Diagnosis present

## 2014-05-31 DIAGNOSIS — R0602 Shortness of breath: Secondary | ICD-10-CM | POA: Diagnosis not present

## 2014-05-31 DIAGNOSIS — R05 Cough: Secondary | ICD-10-CM | POA: Diagnosis not present

## 2014-05-31 DIAGNOSIS — J9610 Chronic respiratory failure, unspecified whether with hypoxia or hypercapnia: Secondary | ICD-10-CM | POA: Diagnosis not present

## 2014-05-31 DIAGNOSIS — K219 Gastro-esophageal reflux disease without esophagitis: Secondary | ICD-10-CM | POA: Diagnosis present

## 2014-05-31 DIAGNOSIS — J42 Unspecified chronic bronchitis: Secondary | ICD-10-CM

## 2014-05-31 DIAGNOSIS — Z9181 History of falling: Secondary | ICD-10-CM | POA: Diagnosis not present

## 2014-05-31 DIAGNOSIS — Z882 Allergy status to sulfonamides status: Secondary | ICD-10-CM | POA: Diagnosis not present

## 2014-05-31 DIAGNOSIS — S0990XA Unspecified injury of head, initial encounter: Secondary | ICD-10-CM | POA: Diagnosis not present

## 2014-05-31 DIAGNOSIS — S82853A Displaced trimalleolar fracture of unspecified lower leg, initial encounter for closed fracture: Secondary | ICD-10-CM | POA: Diagnosis present

## 2014-05-31 DIAGNOSIS — S199XXA Unspecified injury of neck, initial encounter: Secondary | ICD-10-CM | POA: Diagnosis not present

## 2014-05-31 DIAGNOSIS — S0101XA Laceration without foreign body of scalp, initial encounter: Secondary | ICD-10-CM

## 2014-05-31 DIAGNOSIS — J449 Chronic obstructive pulmonary disease, unspecified: Secondary | ICD-10-CM | POA: Diagnosis present

## 2014-05-31 DIAGNOSIS — S8991XA Unspecified injury of right lower leg, initial encounter: Secondary | ICD-10-CM | POA: Diagnosis not present

## 2014-05-31 DIAGNOSIS — W228XXA Striking against or struck by other objects, initial encounter: Secondary | ICD-10-CM | POA: Diagnosis present

## 2014-05-31 DIAGNOSIS — W1830XA Fall on same level, unspecified, initial encounter: Secondary | ICD-10-CM | POA: Diagnosis present

## 2014-05-31 DIAGNOSIS — S0101XD Laceration without foreign body of scalp, subsequent encounter: Secondary | ICD-10-CM | POA: Diagnosis not present

## 2014-05-31 DIAGNOSIS — S82856D Nondisplaced trimalleolar fracture of unspecified lower leg, subsequent encounter for closed fracture with routine healing: Secondary | ICD-10-CM | POA: Diagnosis not present

## 2014-05-31 DIAGNOSIS — Z853 Personal history of malignant neoplasm of breast: Secondary | ICD-10-CM | POA: Diagnosis not present

## 2014-05-31 DIAGNOSIS — R55 Syncope and collapse: Secondary | ICD-10-CM

## 2014-05-31 DIAGNOSIS — M25572 Pain in left ankle and joints of left foot: Secondary | ICD-10-CM | POA: Diagnosis present

## 2014-05-31 DIAGNOSIS — Z7982 Long term (current) use of aspirin: Secondary | ICD-10-CM | POA: Diagnosis not present

## 2014-05-31 DIAGNOSIS — E785 Hyperlipidemia, unspecified: Secondary | ICD-10-CM | POA: Diagnosis present

## 2014-05-31 DIAGNOSIS — D62 Acute posthemorrhagic anemia: Secondary | ICD-10-CM | POA: Diagnosis not present

## 2014-05-31 DIAGNOSIS — Z880 Allergy status to penicillin: Secondary | ICD-10-CM | POA: Diagnosis not present

## 2014-05-31 DIAGNOSIS — Z888 Allergy status to other drugs, medicaments and biological substances status: Secondary | ICD-10-CM | POA: Diagnosis not present

## 2014-05-31 DIAGNOSIS — G8918 Other acute postprocedural pain: Secondary | ICD-10-CM | POA: Diagnosis not present

## 2014-05-31 DIAGNOSIS — N189 Chronic kidney disease, unspecified: Secondary | ICD-10-CM | POA: Diagnosis not present

## 2014-05-31 DIAGNOSIS — Z96653 Presence of artificial knee joint, bilateral: Secondary | ICD-10-CM | POA: Diagnosis present

## 2014-05-31 DIAGNOSIS — R262 Difficulty in walking, not elsewhere classified: Secondary | ICD-10-CM | POA: Diagnosis not present

## 2014-05-31 DIAGNOSIS — S82852A Displaced trimalleolar fracture of left lower leg, initial encounter for closed fracture: Principal | ICD-10-CM

## 2014-05-31 DIAGNOSIS — J441 Chronic obstructive pulmonary disease with (acute) exacerbation: Secondary | ICD-10-CM | POA: Diagnosis not present

## 2014-05-31 DIAGNOSIS — Z9012 Acquired absence of left breast and nipple: Secondary | ICD-10-CM | POA: Diagnosis present

## 2014-05-31 DIAGNOSIS — M6281 Muscle weakness (generalized): Secondary | ICD-10-CM | POA: Diagnosis not present

## 2014-05-31 DIAGNOSIS — S82852B Displaced trimalleolar fracture of left lower leg, initial encounter for open fracture type I or II: Secondary | ICD-10-CM | POA: Diagnosis not present

## 2014-05-31 DIAGNOSIS — Z9981 Dependence on supplemental oxygen: Secondary | ICD-10-CM | POA: Diagnosis not present

## 2014-05-31 DIAGNOSIS — M21279 Flexion deformity, unspecified ankle and toes: Secondary | ICD-10-CM | POA: Diagnosis not present

## 2014-05-31 DIAGNOSIS — S82852D Displaced trimalleolar fracture of left lower leg, subsequent encounter for closed fracture with routine healing: Secondary | ICD-10-CM | POA: Diagnosis not present

## 2014-05-31 DIAGNOSIS — W19XXXA Unspecified fall, initial encounter: Secondary | ICD-10-CM | POA: Diagnosis not present

## 2014-05-31 DIAGNOSIS — N179 Acute kidney failure, unspecified: Secondary | ICD-10-CM

## 2014-05-31 DIAGNOSIS — Z79899 Other long term (current) drug therapy: Secondary | ICD-10-CM | POA: Diagnosis not present

## 2014-05-31 DIAGNOSIS — R278 Other lack of coordination: Secondary | ICD-10-CM | POA: Diagnosis not present

## 2014-05-31 DIAGNOSIS — J9811 Atelectasis: Secondary | ICD-10-CM | POA: Diagnosis not present

## 2014-05-31 DIAGNOSIS — I959 Hypotension, unspecified: Secondary | ICD-10-CM | POA: Diagnosis not present

## 2014-05-31 LAB — CBC WITH DIFFERENTIAL/PLATELET
BASOS PCT: 0 % (ref 0–1)
BASOS PCT: 0 % (ref 0–1)
Basophils Absolute: 0 10*3/uL (ref 0.0–0.1)
Basophils Absolute: 0 10*3/uL (ref 0.0–0.1)
EOS ABS: 0.2 10*3/uL (ref 0.0–0.7)
EOS ABS: 0.2 10*3/uL (ref 0.0–0.7)
EOS PCT: 2 % (ref 0–5)
Eosinophils Relative: 2 % (ref 0–5)
HEMATOCRIT: 35.9 % — AB (ref 36.0–46.0)
HEMATOCRIT: 38.9 % (ref 36.0–46.0)
HEMOGLOBIN: 11.3 g/dL — AB (ref 12.0–15.0)
HEMOGLOBIN: 12.6 g/dL (ref 12.0–15.0)
Lymphocytes Relative: 16 % (ref 12–46)
Lymphocytes Relative: 18 % (ref 12–46)
Lymphs Abs: 1.9 10*3/uL (ref 0.7–4.0)
Lymphs Abs: 2.1 10*3/uL (ref 0.7–4.0)
MCH: 29.5 pg (ref 26.0–34.0)
MCH: 30.1 pg (ref 26.0–34.0)
MCHC: 31.5 g/dL (ref 30.0–36.0)
MCHC: 32.4 g/dL (ref 30.0–36.0)
MCV: 93.1 fL (ref 78.0–100.0)
MCV: 93.7 fL (ref 78.0–100.0)
MONO ABS: 0.7 10*3/uL (ref 0.1–1.0)
MONO ABS: 0.7 10*3/uL (ref 0.1–1.0)
MONOS PCT: 6 % (ref 3–12)
MONOS PCT: 6 % (ref 3–12)
Neutro Abs: 8.7 10*3/uL — ABNORMAL HIGH (ref 1.7–7.7)
Neutro Abs: 9.4 10*3/uL — ABNORMAL HIGH (ref 1.7–7.7)
Neutrophils Relative %: 74 % (ref 43–77)
Neutrophils Relative %: 76 % (ref 43–77)
Platelets: 262 10*3/uL (ref 150–400)
Platelets: 288 10*3/uL (ref 150–400)
RBC: 3.83 MIL/uL — AB (ref 3.87–5.11)
RBC: 4.18 MIL/uL (ref 3.87–5.11)
RDW: 13.9 % (ref 11.5–15.5)
RDW: 14 % (ref 11.5–15.5)
WBC: 11.6 10*3/uL — AB (ref 4.0–10.5)
WBC: 12.2 10*3/uL — ABNORMAL HIGH (ref 4.0–10.5)

## 2014-05-31 LAB — COMPREHENSIVE METABOLIC PANEL
ALBUMIN: 3.4 g/dL — AB (ref 3.5–5.2)
ALK PHOS: 74 U/L (ref 39–117)
ALT: 25 U/L (ref 0–35)
ALT: 33 U/L (ref 0–35)
AST: 23 U/L (ref 0–37)
AST: 29 U/L (ref 0–37)
Albumin: 2.9 g/dL — ABNORMAL LOW (ref 3.5–5.2)
Alkaline Phosphatase: 66 U/L (ref 39–117)
Anion gap: 9 (ref 5–15)
Anion gap: 9 (ref 5–15)
BILIRUBIN TOTAL: 0.4 mg/dL (ref 0.3–1.2)
BUN: 27 mg/dL — ABNORMAL HIGH (ref 6–23)
BUN: 27 mg/dL — ABNORMAL HIGH (ref 6–23)
CALCIUM: 8.8 mg/dL (ref 8.4–10.5)
CO2: 28 mmol/L (ref 19–32)
CO2: 34 mmol/L — ABNORMAL HIGH (ref 19–32)
CREATININE: 1.21 mg/dL — AB (ref 0.50–1.10)
Calcium: 9.5 mg/dL (ref 8.4–10.5)
Chloride: 100 mmol/L (ref 96–112)
Chloride: 93 mmol/L — ABNORMAL LOW (ref 96–112)
Creatinine, Ser: 1.51 mg/dL — ABNORMAL HIGH (ref 0.50–1.10)
GFR calc Af Amer: 37 mL/min — ABNORMAL LOW (ref >90)
GFR calc Af Amer: 48 mL/min — ABNORMAL LOW (ref >90)
GFR calc non Af Amer: 32 mL/min — ABNORMAL LOW (ref >90)
GFR, EST NON AFRICAN AMERICAN: 42 mL/min — AB (ref >90)
Glucose, Bld: 130 mg/dL — ABNORMAL HIGH (ref 70–99)
Glucose, Bld: 156 mg/dL — ABNORMAL HIGH (ref 70–99)
POTASSIUM: 3.8 mmol/L (ref 3.5–5.1)
Potassium: 4 mmol/L (ref 3.5–5.1)
SODIUM: 136 mmol/L (ref 135–145)
Sodium: 137 mmol/L (ref 135–145)
TOTAL PROTEIN: 5.2 g/dL — AB (ref 6.0–8.3)
TOTAL PROTEIN: 5.9 g/dL — AB (ref 6.0–8.3)
Total Bilirubin: 0.2 mg/dL — ABNORMAL LOW (ref 0.3–1.2)

## 2014-05-31 LAB — LACTIC ACID, PLASMA
LACTIC ACID, VENOUS: 2.1 mmol/L — AB (ref 0.5–2.0)
LACTIC ACID, VENOUS: 1.5 mmol/L (ref 0.5–2.0)

## 2014-05-31 LAB — URINALYSIS, ROUTINE W REFLEX MICROSCOPIC
Glucose, UA: NEGATIVE mg/dL
HGB URINE DIPSTICK: NEGATIVE
KETONES UR: 15 mg/dL — AB
Leukocytes, UA: NEGATIVE
Nitrite: NEGATIVE
PROTEIN: NEGATIVE mg/dL
Specific Gravity, Urine: 1.023 (ref 1.005–1.030)
UROBILINOGEN UA: 1 mg/dL (ref 0.0–1.0)
pH: 5.5 (ref 5.0–8.0)

## 2014-05-31 LAB — TROPONIN I

## 2014-05-31 LAB — TSH: TSH: 2.522 u[IU]/mL (ref 0.350–4.500)

## 2014-05-31 LAB — CORTISOL: Cortisol, Plasma: 7.2 ug/dL

## 2014-05-31 MED ORDER — PANTOPRAZOLE SODIUM 40 MG PO TBEC: 40.0000 mg | DELAYED_RELEASE_TABLET | Freq: Every day | ORAL | Status: DC

## 2014-05-31 MED ORDER — MIDAZOLAM HCL 2 MG/2ML IJ SOLN
2.0000 mg | Freq: Once | INTRAMUSCULAR | Status: AC
  Administered 2014-05-31: 2 mg via INTRAVENOUS

## 2014-05-31 MED ORDER — MIDAZOLAM HCL 2 MG/2ML IJ SOLN
INTRAMUSCULAR | Status: AC
  Filled 2014-05-31: qty 4

## 2014-05-31 MED ORDER — CLONAZEPAM 1 MG PO TABS
1.0000 mg | ORAL_TABLET | Freq: Every day | ORAL | Status: DC
  Administered 2014-05-31 – 2014-06-02 (×3): 1 mg via ORAL
  Filled 2014-05-31 (×3): qty 1

## 2014-05-31 MED ORDER — HYDROCODONE-ACETAMINOPHEN 5-325 MG PO TABS
1.0000 | ORAL_TABLET | Freq: Four times a day (QID) | ORAL | Status: DC | PRN
  Administered 2014-05-31 – 2014-06-02 (×5): 1 via ORAL
  Filled 2014-05-31 (×5): qty 1

## 2014-05-31 MED ORDER — VENLAFAXINE HCL 75 MG PO TABS
75.0000 mg | ORAL_TABLET | Freq: Three times a day (TID) | ORAL | Status: DC
  Administered 2014-05-31 – 2014-06-03 (×8): 75 mg via ORAL
  Filled 2014-05-31 (×13): qty 1

## 2014-05-31 MED ORDER — BUDESONIDE 0.25 MG/2ML IN SUSP
0.2500 mg | Freq: Two times a day (BID) | RESPIRATORY_TRACT | Status: DC
  Administered 2014-05-31 – 2014-06-03 (×6): 0.25 mg via RESPIRATORY_TRACT
  Filled 2014-05-31 (×6): qty 2

## 2014-05-31 MED ORDER — ACETAMINOPHEN 325 MG PO TABS
650.0000 mg | ORAL_TABLET | Freq: Four times a day (QID) | ORAL | Status: DC | PRN
  Administered 2014-05-31: 650 mg via ORAL
  Filled 2014-05-31: qty 2

## 2014-05-31 MED ORDER — PANTOPRAZOLE SODIUM 40 MG PO TBEC
40.0000 mg | DELAYED_RELEASE_TABLET | Freq: Every day | ORAL | Status: DC
  Administered 2014-05-31 – 2014-06-03 (×3): 40 mg via ORAL
  Filled 2014-05-31 (×3): qty 1

## 2014-05-31 MED ORDER — SODIUM CHLORIDE 0.9 % IV BOLUS (SEPSIS)
1000.0000 mL | INTRAVENOUS | Status: DC | PRN
  Administered 2014-05-31: 1000 mL via INTRAVENOUS
  Filled 2014-05-31: qty 1000

## 2014-05-31 MED ORDER — ATORVASTATIN CALCIUM 10 MG PO TABS
20.0000 mg | ORAL_TABLET | Freq: Every evening | ORAL | Status: DC
  Administered 2014-05-31 – 2014-06-02 (×2): 20 mg via ORAL
  Filled 2014-05-31 (×2): qty 2

## 2014-05-31 MED ORDER — ONDANSETRON HCL 4 MG/2ML IJ SOLN
4.0000 mg | Freq: Four times a day (QID) | INTRAMUSCULAR | Status: DC | PRN
  Administered 2014-05-31: 4 mg via INTRAVENOUS
  Filled 2014-05-31: qty 2

## 2014-05-31 MED ORDER — SODIUM CHLORIDE 0.9 % IJ SOLN
3.0000 mL | Freq: Two times a day (BID) | INTRAMUSCULAR | Status: DC
  Administered 2014-06-02 – 2014-06-03 (×2): 3 mL via INTRAVENOUS

## 2014-05-31 MED ORDER — GABAPENTIN 300 MG PO CAPS
300.0000 mg | ORAL_CAPSULE | Freq: Every day | ORAL | Status: DC
  Administered 2014-05-31 – 2014-06-02 (×3): 300 mg via ORAL
  Filled 2014-05-31 (×3): qty 1

## 2014-05-31 MED ORDER — FENTANYL CITRATE 0.05 MG/ML IJ SOLN
50.0000 ug | Freq: Once | INTRAMUSCULAR | Status: AC
  Administered 2014-05-31: 50 ug via INTRAVENOUS

## 2014-05-31 MED ORDER — MORPHINE SULFATE 2 MG/ML IJ SOLN
1.0000 mg | INTRAMUSCULAR | Status: DC | PRN
  Administered 2014-05-31: 1 mg via INTRAVENOUS
  Filled 2014-05-31: qty 1

## 2014-05-31 MED ORDER — ACETAMINOPHEN 650 MG RE SUPP: 650.0000 mg | Freq: Four times a day (QID) | RECTAL | Status: DC | PRN

## 2014-05-31 MED ORDER — MONTELUKAST SODIUM 10 MG PO TABS
10.0000 mg | ORAL_TABLET | Freq: Every day | ORAL | Status: DC
  Administered 2014-05-31 – 2014-06-03 (×3): 10 mg via ORAL
  Filled 2014-05-31 (×3): qty 1

## 2014-05-31 MED ORDER — ALBUTEROL SULFATE HFA 108 (90 BASE) MCG/ACT IN AERS: 1.0000 | INHALATION_SPRAY | Freq: Four times a day (QID) | RESPIRATORY_TRACT | Status: DC | PRN

## 2014-05-31 MED ORDER — SODIUM CHLORIDE 0.9 % IV SOLN
INTRAVENOUS | Status: AC
  Administered 2014-05-31 (×2): via INTRAVENOUS

## 2014-05-31 MED ORDER — HYDRALAZINE HCL 20 MG/ML IJ SOLN: 10.0000 mg | INTRAMUSCULAR | Status: DC | PRN

## 2014-05-31 MED ORDER — ARFORMOTEROL TARTRATE 15 MCG/2ML IN NEBU
15.0000 ug | INHALATION_SOLUTION | Freq: Two times a day (BID) | RESPIRATORY_TRACT | Status: DC
  Administered 2014-05-31 – 2014-06-03 (×5): 15 ug via RESPIRATORY_TRACT
  Filled 2014-05-31 (×11): qty 2

## 2014-05-31 MED ORDER — SODIUM CHLORIDE 0.9 % IV SOLN: INTRAVENOUS | Status: DC

## 2014-05-31 MED ORDER — ALBUTEROL SULFATE (2.5 MG/3ML) 0.083% IN NEBU: 2.5000 mg | INHALATION_SOLUTION | Freq: Four times a day (QID) | RESPIRATORY_TRACT | Status: DC | PRN

## 2014-05-31 MED ORDER — HYDROMORPHONE HCL 1 MG/ML IJ SOLN
0.5000 mg | INTRAMUSCULAR | Status: DC | PRN
  Administered 2014-05-31 – 2014-06-02 (×9): 0.5 mg via INTRAVENOUS
  Filled 2014-05-31 (×9): qty 1

## 2014-05-31 MED ORDER — SODIUM CHLORIDE 0.9 % IV BOLUS (SEPSIS)
250.0000 mL | Freq: Once | INTRAVENOUS | Status: AC
  Administered 2014-05-31: 250 mL via INTRAVENOUS

## 2014-05-31 MED ORDER — NITROGLYCERIN 0.4 MG SL SUBL: 0.4000 mg | SUBLINGUAL_TABLET | SUBLINGUAL | Status: DC | PRN

## 2014-05-31 MED ORDER — FENTANYL CITRATE 0.05 MG/ML IJ SOLN
INTRAMUSCULAR | Status: AC
  Filled 2014-05-31: qty 4

## 2014-05-31 MED ORDER — ONDANSETRON HCL 4 MG PO TABS: 4.0000 mg | ORAL_TABLET | Freq: Four times a day (QID) | ORAL | Status: DC | PRN

## 2014-05-31 NOTE — H&P (Signed)
[facility] History and Physical  Amanda Palmer XBJ:[id number] DOB: November 14, 1933 DOA: 05/30/2014  Referring physician: ER physician. PCP: Delphina Cahill, MD   Chief Complaint: Loss of consciousness.  HPI: Amanda Palmer is a 81 y.o. female with history of COPD, hypertension, hyperlipidemia who was recently discharged after being treated for COPD exacerbation presents to the ER because patient had an episode of loss of consciousness. Patient stated that she was feeling poorly since morning yesterday. Denies any chest pain or shortness of breath nausea vomiting abdominal pain. She did have one episode of diarrhea 2 days ago. Patient states that last night she was walking when she suddenly lost consciousness and she did not have any prodrome. Patient was brought to the ER and was found to be mildly hypotensive. CT head and neck was unremarkable for anything acute. Patient did have a mild scalp abrasion. Patient states she did get tetanus toxoid injection last year. Patient also had developed left ankle fracture. Radiates has been consulted. Patient has been given 500 mL normal saline bolus for her hypotension. Patient will be admitted for further management. Patient usually uses home oxygen at bedtime for her COPD. Presently can walk without much difficulty but cannot climb stairs.  Review of Systems: As presented in the history of presenting illness, rest negative.  Past Medical History  Diagnosis Date  . Cancer     breast  . Hypertension   . COPD (chronic obstructive pulmonary disease)    Past Surgical History  Procedure Laterality Date  . Cardiac catheterization    . Total knee arthroplasty      bilateral  . Total hip arthroplasty      right  . Rotator cuff repair      left  . Mastectomy      right    Social History:  reports that she has never smoked. She has never used smokeless tobacco. She reports that she does not drink alcohol or use illicit drugs. Where does patient live  home. Can patient participate in ADLs? Yes.  Allergies  Allergen Reactions  . Morphine And Related Nausea And Vomiting  . Sulfa Antibiotics Nausea And Vomiting  . Penicillins Rash  . Prednisone Nausea And Vomiting, Anxiety and Other (See Comments)    Makes patient not in right state of mind    Family History: History reviewed. No pertinent family history.    Prior to Admission medications   Medication Sig Start Date End Date Taking? Authorizing Provider  albuterol (PROVENTIL HFA;VENTOLIN HFA) 108 (90 BASE) MCG/ACT inhaler Inhale 1-2 puffs into the lungs every 6 (six) hours as needed for wheezing or shortness of breath.   Yes Historical Provider, MD  albuterol (PROVENTIL) (2.5 MG/3ML) 0.083% nebulizer solution Take 2.5 mg by nebulization every 6 (six) hours as needed for wheezing or shortness of breath.   Yes Historical Provider, MD  arformoterol (BROVANA) 15 MCG/2ML NEBU Take 15 mcg by nebulization 2 (two) times daily.    Yes Historical Provider, MD  aspirin 325 MG tablet Take 325 mg by mouth daily.   Yes Historical Provider, MD  atorvastatin (LIPITOR) 20 MG tablet Take 20 mg by mouth every evening.    Yes Historical Provider, MD  budesonide (PULMICORT) 0.25 MG/2ML nebulizer solution Take 0.25 mg by nebulization 2 (two) times daily.    Yes Historical Provider, MD  Calcium Carb-Cholecalciferol (CALCIUM + D3) 600-200 MG-UNIT TABS Take 1 tablet by mouth 2 (two) times daily.    Yes Historical Provider, MD  clonazePAM Bobbye [place])  1 MG tablet Take 1 mg by mouth at bedtime.    Yes Historical Provider, MD  esomeprazole (NEXIUM) 40 MG capsule Take 40 mg by mouth daily at 12 noon.   Yes Historical Provider, MD  gabapentin (NEURONTIN) 300 MG capsule Take 300 mg by mouth at bedtime.  04/29/14  Yes Historical Provider, MD  HYDROcodone-acetaminophen (NORCO/VICODIN) 5-325 MG per tablet Take 1 tablet by mouth every 6 (six) hours as needed for moderate pain.   Yes Historical Provider, MD   losartan-hydrochlorothiazide (HYZAAR) 100-25 MG per tablet Take 1 tablet by mouth daily.    Yes Historical Provider, MD  montelukast (SINGULAIR) 10 MG tablet Take 10 mg by mouth daily.   Yes Historical Provider, MD  Multiple Vitamin (MULTIVITAMIN WITH MINERALS) TABS tablet Take 1 tablet by mouth daily.   Yes Historical Provider, MD  nitroGLYCERIN (NITROSTAT) 0.4 MG SL tablet Place 0.4 mg under the tongue every 5 (five) minutes as needed for chest pain.    Yes Historical Provider, MD  omeprazole (PRILOSEC) 20 MG capsule Take 20 mg by mouth daily.  04/20/14  Yes Historical Provider, MD  traMADol (ULTRAM) 50 MG tablet Take 50 mg by mouth every 6 (six) hours as needed for moderate pain.  03/12/13  Yes Historical Provider, MD  triamcinolone cream (KENALOG) 0.1 % Apply 1 application topically 2 (two) times daily as needed (rash).    Yes Historical Provider, MD  venlafaxine (EFFEXOR) 75 MG tablet Take 75 mg by mouth 3 (three) times daily with meals.    Yes Historical Provider, MD  levofloxacin (LEVAQUIN) 500 MG tablet Take 1 tablet (500 mg total) by mouth daily. Patient not taking: Reported on 05/31/2014 05/23/14   Erline Hau, MD  predniSONE (DELTASONE) 10 MG tablet Take 1 tablet (10 mg total) by mouth daily with breakfast. Take 6 tablets today and then decrease by 1 tablet daily until none are left. Patient not taking: Reported on 05/31/2014 05/23/14   Erline Hau, MD    Physical Exam: Filed Vitals:   05/31/14 5035 05/31/14 0329 05/31/14 0338 05/31/14 0425  BP: 96/40 96/38 103/45 109/45  Pulse: 78 87 82 85  Temp:    98.1 F (36.7 C)  TempSrc:    Oral  Resp: 17 16 20 18   Height:      Weight:      SpO2: 100% 100% 100% 97%     General:  Well built and nourished.  Eyes: Anicteric. No pallor.  ENT: No discharge from the ears eyes nose and mouth.  Neck: No mass felt.  Cardiovascular: S1-S2 heard.  Respiratory: No rhonchi or crepitations.  Abdomen: Soft nontender  bowel sounds present.  Skin: Bruise on the abdomen.  Musculoskeletal: Left ankle in splint.  Psychiatric: Appears normal.  Neurologic: Alert awake oriented to time place and person. Moves all extremities.  Labs on Admission:  Basic Metabolic Panel:  Recent Labs Lab 05/30/14 2350  NA 136  K 3.8  CL 93*  CO2 34*  GLUCOSE 156*  BUN 27*  CREATININE 1.51*  CALCIUM 9.5   Liver Function Tests:  Recent Labs Lab 05/30/14 2350  AST 29  ALT 33  ALKPHOS 74  BILITOT 0.4  PROT 5.9*  ALBUMIN 3.4*   No results for input(s): LIPASE, AMYLASE in the last 168 hours. No results for input(s): AMMONIA in the last 168 hours. CBC:  Recent Labs Lab 05/30/14 2350  WBC 12.2*  NEUTROABS 9.4*  HGB 12.6  HCT 38.9  MCV 93.1  PLT 288   Cardiac Enzymes: No results for input(s): CKTOTAL, CKMB, CKMBINDEX, TROPONINI in the last 168 hours.  BNP (last 3 results)  Recent Labs  05/21/14 2332  BNP 67.0    ProBNP (last 3 results) No results for input(s): PROBNP in the last 8760 hours.  CBG: No results for input(s): GLUCAP in the last 168 hours.  Radiological Exams on Admission: Dg Knee 2 Views Right  05/31/2014   CLINICAL DATA:  [place]  EXAM: RIGHT KNEE - 1-2 VIEW  COMPARISON:  None.  FINDINGS: There are intact appearances of the total knee arthroplasty. There is no fracture or dislocation. There is no soft tissue foreign body.  IMPRESSION: Negative.   Electronically Signed   By: Andreas Newport M.D.   On: 05/31/2014 01:58   Dg Ankle Complete Left  05/31/2014   CLINICAL DATA:  [place]  EXAM: LEFT ANKLE COMPLETE - 3+ VIEW  COMPARISON:  None.  FINDINGS: Three views of the left ankle obtained through fiberglass demonstrate an oblique distal fibular fracture with a little more than 1 cortex width posterolateral displacement. There is a transverse medial malleolar fracture with moderate distraction. There is a vertical posterior malleolar fracture with 1 cortex width displacement. There is  disruption of the mortise with a symmetric medial widening and mild valgus.  IMPRESSION: Trimalleolar fracture.  Mild valgus.   Electronically Signed   By: Andreas Newport M.D.   On: 05/31/2014 02:00   Ct Head Wo Contrast  05/31/2014   CLINICAL DATA:  Initial evaluation for acute trauma, fall.  EXAM: CT HEAD WITHOUT CONTRAST  CT CERVICAL SPINE WITHOUT CONTRAST  TECHNIQUE: Multidetector CT imaging of the head and cervical spine was performed following the standard protocol without intravenous contrast. Multiplanar CT image reconstructions of the cervical spine were also generated.  COMPARISON:  None.  FINDINGS: CT HEAD FINDINGS  Diffuse prominence of the CSF containing spaces compatible with generalized cerebral atrophy. Patchy and confluent hypodensity within the periventricular and deep white matter both cerebral hemispheres most consistent with chronic small vessel ischemic changes.  No acute intracranial hemorrhage or large vessel territory infarct. No extra-axial fluid collection. No mass lesion or midline shift. No hydrocephalus.  Scalp soft tissues within normal limits.  Calvarium intact.  No acute abnormality seen about the orbits.  Scattered mucoperiosteal thickening present on about the ethmoidal air cells as well as the right maxillary and sphenoid sinuses. No mastoid effusion.  CT CERVICAL SPINE FINDINGS  There is trace anterior listhesis of C4 on C5, C5 on C6, and C6 on C7 Facet arthrosis at these levels suggest that this is chronic in nature. No fracture or acute listhesis. Normal C1-2 articulations intact. Vertebral body heights preserved.  Prevertebral soft tissues normal.  Mild multilevel degenerative disc disease is evidenced by intervertebral disc space narrowing and endplate osteophytosis is present, most prevalent at C4-5.  No acute soft tissue abnormality within the neck. Partially visualized lungs are clear. 5 mm left upper lobe nodule noted.  IMPRESSION: CT BRAIN:  1. No acute  intracranial process. 2. Atrophy with chronic microvascular ischemic disease.  CT CERVICAL SPINE:  1. No acute traumatic injury within the cervical spine. 2. Chronic degenerative anterolisthesis of C4 on C5, C5 on C6 common C6 on C7. 3. 5 mm left upper lobe nodule, indeterminate. If the patient is at high risk for bronchogenic carcinoma, follow-up chest CT at 6-12 months is recommended. If the patient is at low risk for bronchogenic carcinoma, follow-up chest CT at 12 months  is recommended. This recommendation follows the consensus statement: Guidelines for Management of Small Pulmonary Nodules Detected on CT Scans: A Statement from the [place] as published in Radiology 2005;237:395-400.   Electronically Signed   By: Jeannine Boga M.D.   On: 05/31/2014 02:21   Ct Cervical Spine Wo Contrast  05/31/2014   CLINICAL DATA:  Initial evaluation for acute trauma, fall.  EXAM: CT HEAD WITHOUT CONTRAST  CT CERVICAL SPINE WITHOUT CONTRAST  TECHNIQUE: Multidetector CT imaging of the head and cervical spine was performed following the standard protocol without intravenous contrast. Multiplanar CT image reconstructions of the cervical spine were also generated.  COMPARISON:  None.  FINDINGS: CT HEAD FINDINGS  Diffuse prominence of the CSF containing spaces compatible with generalized cerebral atrophy. Patchy and confluent hypodensity within the periventricular and deep white matter both cerebral hemispheres most consistent with chronic small vessel ischemic changes.  No acute intracranial hemorrhage or large vessel territory infarct. No extra-axial fluid collection. No mass lesion or midline shift. No hydrocephalus.  Scalp soft tissues within normal limits.  Calvarium intact.  No acute abnormality seen about the orbits.  Scattered mucoperiosteal thickening present on about the ethmoidal air cells as well as the right maxillary and sphenoid sinuses. No mastoid effusion.  CT CERVICAL SPINE FINDINGS  There is  trace anterior listhesis of C4 on C5, C5 on C6, and C6 on C7 Facet arthrosis at these levels suggest that this is chronic in nature. No fracture or acute listhesis. Normal C1-2 articulations intact. Vertebral body heights preserved.  Prevertebral soft tissues normal.  Mild multilevel degenerative disc disease is evidenced by intervertebral disc space narrowing and endplate osteophytosis is present, most prevalent at C4-5.  No acute soft tissue abnormality within the neck. Partially visualized lungs are clear. 5 mm left upper lobe nodule noted.  IMPRESSION: CT BRAIN:  1. No acute intracranial process. 2. Atrophy with chronic microvascular ischemic disease.  CT CERVICAL SPINE:  1. No acute traumatic injury within the cervical spine. 2. Chronic degenerative anterolisthesis of C4 on C5, C5 on C6 common C6 on C7. 3. 5 mm left upper lobe nodule, indeterminate. If the patient is at high risk for bronchogenic carcinoma, follow-up chest CT at 6-12 months is recommended. If the patient is at low risk for bronchogenic carcinoma, follow-up chest CT at 12 months is recommended. This recommendation follows the consensus statement: Guidelines for Management of Small Pulmonary Nodules Detected on CT Scans: A Statement from the [place] as published in Radiology 2005;237:395-400.   Electronically Signed   By: Jeannine Boga M.D.   On: 05/31/2014 02:21   Dg Chest Portable 1 View  05/31/2014   CLINICAL DATA:  Status post fall. Shortness of breath and cough. Initial encounter.  EXAM: PORTABLE CHEST - 1 VIEW  COMPARISON:  Chest radiograph performed 05/22/2014  FINDINGS: The lungs are well-aerated. Minimal bibasilar atelectasis is noted. There is no evidence of pleural effusion or pneumothorax.  The cardiomediastinal silhouette is within normal limits. No acute osseous abnormalities are seen. Subcortical cystic changes are seen at both humeral heads, with superior subluxation of the left humeral head, reflecting  underlying chronic rotator cuff tear.  IMPRESSION: Minimal bibasilar atelectasis noted; lungs otherwise clear. No displaced rib fracture seen.   Electronically Signed   By: Garald Balding M.D.   On: 05/31/2014 00:32    EKG: Independently reviewed. Normal sinus rhythm with atrial premature complexes.  Assessment/Plan Principal Problem:   Syncope and collapse Active Problems:   COPD (chronic  obstructive pulmonary disease)   HTN (hypertension)   Trimalleolar fracture of ankle, closed   Scalp laceration   ARF (acute renal failure)   Syncope   1. Syncope - patient is found to be hypotensive on admission. Which probably caused patient is syncopal episode. Since patient was recently on steroid taper dose which patient just finished I will check cortisol level. Closely monitor in telemetry for any arrhythmias. Hold antihypertensives for now and gently hydrate. Check 2-D echo. Check troponin. 2. Left ankle fracture status post fall - Dr. Lorin Mercy to see patient in consult. If patient's blood pressure remained stable then patient will be moderate risk for intermediate risk procedure if surgery is planned for a left ankle. 3. Acute renal failure - probably from hypotension. Hold ARB and diuretics. Continue hydration. Check urine analysis. Closely follow intake output and metabolic panel. 4. Scalp abrasion - closely observe. 5. COPD - presently not wheezing continue inhalers. 6. Hyperlipidemia on statins. 7. Hypertension seen #1 and 3 - holding antihypertensives for now. When necessary IV hydralazine for systolic blood pressure more than 160. 8. Lung nodule seen in CT scan - patient will need close follow-up once discharged.   DVT Prophylaxis SCDs.  Code Status: Full code.  Family Communication: None.  Disposition Plan: Admit to inpatient.    Alishia Lebo N. [facility] Pager [phone].  If 7PM-7AM, please contact night-coverage [email or web address] Password [email or web address] 05/31/2014, 4:30  AM

## 2014-05-31 NOTE — Progress Notes (Signed)
Utilization review completed.  

## 2014-05-31 NOTE — Progress Notes (Signed)
Orthopedic Tech Progress Note Patient Details:  Amanda Palmer April 21, 1933 [id number]  Ortho Devices Type of Ortho Device: Stirrup splint, Post (short) splint Splint Material: Fiberglass Ortho Device/Splint Interventions: Application   Katheren Shams 05/31/2014, 12:31 AM

## 2014-05-31 NOTE — Consult Note (Signed)
Patient ID: Amanda Palmer MRN: [id number] DOB/AGE: 03-22-1933 81 y.o.  Admit date: 05/30/2014  Admission Diagnoses:  Principal Problem:   Syncope and collapse Active Problems:   COPD (chronic obstructive pulmonary disease)   HTN (hypertension)   Trimalleolar fracture of ankle, closed   Scalp laceration   ARF (acute renal failure)   Syncope   HPI: 81 yo wf being seen at the request of hospitalist service for left trimalleolar ankle fracture.  States that she "blacked out" 30 May 2014 when she suffered the injury.  Was seen in the ED for evaluation.    Past Medical History: Past Medical History  Diagnosis Date  . Cancer     breast  . Hypertension   . COPD (chronic obstructive pulmonary disease)     Surgical History: Past Surgical History  Procedure Laterality Date  . Cardiac catheterization    . Total knee arthroplasty      bilateral  . Total hip arthroplasty      right  . Rotator cuff repair      left  . Mastectomy      right     Family History: History reviewed. No pertinent family history.  Social History: History   Social History  . Marital Status: Widowed    Spouse Name: N/A  . Number of Children: N/A  . Years of Education: N/A   Occupational History  . Not on file.   Social History Main Topics  . Smoking status: Never Smoker   . Smokeless tobacco: Never Used  . Alcohol Use: No  . Drug Use: No  . Sexual Activity: Not on file   Other Topics Concern  . Not on file   Social History Narrative    Allergies: Morphine and related; Sulfa antibiotics; Penicillins; and Prednisone  Medications: I have reviewed the patient's current medications.  Vital Signs: Patient Vitals for the past 24 hrs:  BP Temp Temp src Pulse Resp SpO2 Height Weight  05/31/14 0428 - - - - - 97 % - -  05/31/14 0425 (!) 109/45 mmHg 98.1 F (36.7 C) Oral 85 18 97 % - -  05/31/14 0338 (!) 103/45 mmHg - - 82 20 100 % - -  05/31/14 0329 (!) 96/38 mmHg - - 87 16  100 % - -  05/31/14 0035 (!) 96/40 mmHg - - 78 17 100 % - -  05/31/14 0032 (!) 105/42 mmHg - - 81 14 100 % - -  05/31/14 0030 (!) 106/37 mmHg - - 84 18 100 % - -  05/31/14 0027 (!) 106/40 mmHg - - 80 21 99 % - -  05/31/14 0026 (!) 104/39 mmHg - - 81 15 100 % - -  05/31/14 0024 (!) 106/41 mmHg - - 81 21 100 % - -  05/30/14 2332 (!) 95/45 mmHg 97.8 F (36.6 C) Oral 82 - 93 % 5\' 4"  (1.626 m) 65.318 kg (144 lb)    Radiology: Dg Chest 2 View  05/22/2014   CLINICAL DATA:  Acute onset of shortness of breath and congestion, with persistent cough. Initial encounter.  EXAM: CHEST  2 VIEW  COMPARISON:  None.  FINDINGS: The lungs are well-aerated. The lungs are hyperexpanded, with flattening of the hemidiaphragms, likely reflecting COPD. There is no evidence of focal opacification, pleural effusion or pneumothorax.  The heart is normal in size; the mediastinal contour is within normal limits. No acute osseous abnormalities are seen.  IMPRESSION: Findings of COPD.  No acute cardiopulmonary process  seen.   Electronically Signed   By: Garald Balding M.D.   On: 05/22/2014 01:27   Dg Knee 2 Views Right  05/31/2014   CLINICAL DATA:  [place]  EXAM: RIGHT KNEE - 1-2 VIEW  COMPARISON:  None.  FINDINGS: There are intact appearances of the total knee arthroplasty. There is no fracture or dislocation. There is no soft tissue foreign body.  IMPRESSION: Negative.   Electronically Signed   By: Andreas Newport M.D.   On: 05/31/2014 01:58   Dg Ankle Complete Left  05/31/2014   CLINICAL DATA:  [place]  EXAM: LEFT ANKLE COMPLETE - 3+ VIEW  COMPARISON:  None.  FINDINGS: Three views of the left ankle obtained through fiberglass demonstrate an oblique distal fibular fracture with a little more than 1 cortex width posterolateral displacement. There is a transverse medial malleolar fracture with moderate distraction. There is a vertical posterior malleolar fracture with 1 cortex width displacement. There is disruption of the mortise  with a symmetric medial widening and mild valgus.  IMPRESSION: Trimalleolar fracture.  Mild valgus.   Electronically Signed   By: Andreas Newport M.D.   On: 05/31/2014 02:00   Ct Head Wo Contrast  05/31/2014   CLINICAL DATA:  Initial evaluation for acute trauma, fall.  EXAM: CT HEAD WITHOUT CONTRAST  CT CERVICAL SPINE WITHOUT CONTRAST  TECHNIQUE: Multidetector CT imaging of the head and cervical spine was performed following the standard protocol without intravenous contrast. Multiplanar CT image reconstructions of the cervical spine were also generated.  COMPARISON:  None.  FINDINGS: CT HEAD FINDINGS  Diffuse prominence of the CSF containing spaces compatible with generalized cerebral atrophy. Patchy and confluent hypodensity within the periventricular and deep white matter both cerebral hemispheres most consistent with chronic small vessel ischemic changes.  No acute intracranial hemorrhage or large vessel territory infarct. No extra-axial fluid collection. No mass lesion or midline shift. No hydrocephalus.  Scalp soft tissues within normal limits.  Calvarium intact.  No acute abnormality seen about the orbits.  Scattered mucoperiosteal thickening present on about the ethmoidal air cells as well as the right maxillary and sphenoid sinuses. No mastoid effusion.  CT CERVICAL SPINE FINDINGS  There is trace anterior listhesis of C4 on C5, C5 on C6, and C6 on C7 Facet arthrosis at these levels suggest that this is chronic in nature. No fracture or acute listhesis. Normal C1-2 articulations intact. Vertebral body heights preserved.  Prevertebral soft tissues normal.  Mild multilevel degenerative disc disease is evidenced by intervertebral disc space narrowing and endplate osteophytosis is present, most prevalent at C4-5.  No acute soft tissue abnormality within the neck. Partially visualized lungs are clear. 5 mm left upper lobe nodule noted.  IMPRESSION: CT BRAIN:  1. No acute intracranial process. 2. Atrophy with  chronic microvascular ischemic disease.  CT CERVICAL SPINE:  1. No acute traumatic injury within the cervical spine. 2. Chronic degenerative anterolisthesis of C4 on C5, C5 on C6 common C6 on C7. 3. 5 mm left upper lobe nodule, indeterminate. If the patient is at high risk for bronchogenic carcinoma, follow-up chest CT at 6-12 months is recommended. If the patient is at low risk for bronchogenic carcinoma, follow-up chest CT at 12 months is recommended. This recommendation follows the consensus statement: Guidelines for Management of Small Pulmonary Nodules Detected on CT Scans: A Statement from the [place] as published in Radiology 2005;237:395-400.   Electronically Signed   By: Jeannine Boga M.D.   On: 05/31/2014 02:21  Ct Cervical Spine Wo Contrast  05/31/2014   CLINICAL DATA:  Initial evaluation for acute trauma, fall.  EXAM: CT HEAD WITHOUT CONTRAST  CT CERVICAL SPINE WITHOUT CONTRAST  TECHNIQUE: Multidetector CT imaging of the head and cervical spine was performed following the standard protocol without intravenous contrast. Multiplanar CT image reconstructions of the cervical spine were also generated.  COMPARISON:  None.  FINDINGS: CT HEAD FINDINGS  Diffuse prominence of the CSF containing spaces compatible with generalized cerebral atrophy. Patchy and confluent hypodensity within the periventricular and deep white matter both cerebral hemispheres most consistent with chronic small vessel ischemic changes.  No acute intracranial hemorrhage or large vessel territory infarct. No extra-axial fluid collection. No mass lesion or midline shift. No hydrocephalus.  Scalp soft tissues within normal limits.  Calvarium intact.  No acute abnormality seen about the orbits.  Scattered mucoperiosteal thickening present on about the ethmoidal air cells as well as the right maxillary and sphenoid sinuses. No mastoid effusion.  CT CERVICAL SPINE FINDINGS  There is trace anterior listhesis of C4 on C5, C5  on C6, and C6 on C7 Facet arthrosis at these levels suggest that this is chronic in nature. No fracture or acute listhesis. Normal C1-2 articulations intact. Vertebral body heights preserved.  Prevertebral soft tissues normal.  Mild multilevel degenerative disc disease is evidenced by intervertebral disc space narrowing and endplate osteophytosis is present, most prevalent at C4-5.  No acute soft tissue abnormality within the neck. Partially visualized lungs are clear. 5 mm left upper lobe nodule noted.  IMPRESSION: CT BRAIN:  1. No acute intracranial process. 2. Atrophy with chronic microvascular ischemic disease.  CT CERVICAL SPINE:  1. No acute traumatic injury within the cervical spine. 2. Chronic degenerative anterolisthesis of C4 on C5, C5 on C6 common C6 on C7. 3. 5 mm left upper lobe nodule, indeterminate. If the patient is at high risk for bronchogenic carcinoma, follow-up chest CT at 6-12 months is recommended. If the patient is at low risk for bronchogenic carcinoma, follow-up chest CT at 12 months is recommended. This recommendation follows the consensus statement: Guidelines for Management of Small Pulmonary Nodules Detected on CT Scans: A Statement from the [place] as published in Radiology 2005;237:395-400.   Electronically Signed   By: Jeannine Boga M.D.   On: 05/31/2014 02:21   Dg Chest Portable 1 View  05/31/2014   CLINICAL DATA:  Status post fall. Shortness of breath and cough. Initial encounter.  EXAM: PORTABLE CHEST - 1 VIEW  COMPARISON:  Chest radiograph performed 05/22/2014  FINDINGS: The lungs are well-aerated. Minimal bibasilar atelectasis is noted. There is no evidence of pleural effusion or pneumothorax.  The cardiomediastinal silhouette is within normal limits. No acute osseous abnormalities are seen. Subcortical cystic changes are seen at both humeral heads, with superior subluxation of the left humeral head, reflecting underlying chronic rotator cuff tear.   IMPRESSION: Minimal bibasilar atelectasis noted; lungs otherwise clear. No displaced rib fracture seen.   Electronically Signed   By: Garald Balding M.D.   On: 05/31/2014 00:32    Labs:  Recent Labs  05/30/14 2350 05/31/14 0557  WBC 12.2* 11.6*  RBC 4.18 3.83*  HCT 38.9 35.9*  PLT 288 262    Recent Labs  05/30/14 2350 05/31/14 0557  NA 136 137  K 3.8 4.0  CL 93* 100  CO2 34* 28  BUN 27* 27*  CREATININE 1.51* 1.21*  GLUCOSE 156* 130*  CALCIUM 9.5 8.8   No results for input(s):  LABPT, INR in the last 72 hours.  Review of Systems: Review of Systems  Constitutional: Negative.   Musculoskeletal: Positive for joint pain and falls.    Physical Exam: Sensation intact distally toes. Moves toes well.   Good capillary refill.   Splint intact.    Assessment and Plan: Left trimalleolar ankle fracture.  Advised patient that best treatment option at this point is ORIF procedure. Surgical procedure discussed.  Surgery tomorrow if cleared medically.  All questions answered.  NPO after midnight.    Alyson Locket. Ricard Dillon PA-C

## 2014-05-31 NOTE — Progress Notes (Signed)
Patient seen and examined, frail, but aaox3, NAD. Family members at bedside. Orthostatic vital signs abnormal. Lung no wheezes, heart with frequent ectopic beats, left lower extremity with ace wrap, elevated, right lower extremity unremarkable. Good peripheral pulse. Labs: Concentrated urine. No sign of infection, does has leukocytosis, but likely related to dehydration/stress. Lactic acidosis resolved. cxr unremarkable. EKG sinus rhythm with frequent pac's. On home oxygen. Additional fluids bolus ordered, continue maintenance fluids. Echo pending. To OR in am.  Plan of care explained to patient and family expressed understanding.

## 2014-05-31 NOTE — ED Notes (Signed)
Ortho tech, Resp therapy, MD, and two RN's at bedside for procedure.

## 2014-05-31 NOTE — ED Notes (Addendum)
Dr Hal Hope here to see patient  Aware of BP and is going to add fluids.

## 2014-05-31 NOTE — Progress Notes (Signed)
PT Cancellation Note  Patient Details Name: Amanda Palmer MRN: [id number] DOB: 1933/05/17   Cancelled Treatment:    Reason Eval/Treat Not Completed: Medical issues which prohibited therapy. Pt scheduled for surgery tomorrow for ORIF for L ankle.  Spoke to pt and family regarding rehab options and that they will have a better idea after pt has surgery and see how she is moving and was restrictions she has with WB.  Pt grimacing in pain at rest with leg elevated . Pt was in hospital 2 weeks ago with COPD.  Will await post-op orders.  [name], [place] Pager [phone] 05/31/2014    [name] 05/31/2014, 11:15 AM

## 2014-05-31 NOTE — ED Notes (Signed)
Patient transported to CT 

## 2014-05-31 NOTE — Progress Notes (Signed)
CRITICAL VALUE ALERT  Critical value received:  Lactic Acid 2.1  Date of notification: 05/31/14  Time of notification:  7616  Critical value read back:Yes.    Nurse who received alert:  Hetty Linhart  MD notified (1st page):  Dr. Florencia Reasons  Time of first page:  0745  MD notified (2nd page):  Time of second page:  Responding MD:  Dr. Florencia Reasons  Time MD responded:  [phone]

## 2014-05-31 NOTE — ED Notes (Signed)
Patient was unable to void in the bedpan and 300cc of dark yellow urine was collected with an in and out cath. Patient tolerated well and no odor was noted.

## 2014-05-31 NOTE — Progress Notes (Signed)
Per Dr. Hal Hope, pt to receive 250cc NS fluid bolus. Will administer and continue to monitor.

## 2014-06-01 ENCOUNTER — Inpatient Hospital Stay (HOSPITAL_COMMUNITY): Payer: Medicare Other | Admitting: Anesthesiology

## 2014-06-01 ENCOUNTER — Inpatient Hospital Stay (HOSPITAL_COMMUNITY): Payer: Medicare Other

## 2014-06-01 ENCOUNTER — Encounter (HOSPITAL_COMMUNITY)
Admission: EM | Disposition: A | Discharge status: Skilled Nursing Facility | Payer: Self-pay | Source: Home / Self Care | Attending: Internal Medicine

## 2014-06-01 DIAGNOSIS — R55 Syncope and collapse: Secondary | ICD-10-CM

## 2014-06-01 DIAGNOSIS — I10 Essential (primary) hypertension: Secondary | ICD-10-CM

## 2014-06-01 HISTORY — PX: ORIF ANKLE FRACTURE: SHX5408

## 2014-06-01 LAB — SURGICAL PCR SCREEN
MRSA, PCR: NEGATIVE
Staphylococcus aureus: NEGATIVE

## 2014-06-01 SURGERY — OPEN REDUCTION INTERNAL FIXATION (ORIF) ANKLE FRACTURE
Anesthesia: Regional | Site: Ankle | Laterality: Left

## 2014-06-01 MED ORDER — PROPOFOL 10 MG/ML IV BOLUS
INTRAVENOUS | Status: DC | PRN
Start: 2014-06-01 — End: 2014-06-01
  Administered 2014-06-01: 100 mg via INTRAVENOUS

## 2014-06-01 MED ORDER — CEFAZOLIN SODIUM-DEXTROSE 2-3 GM-% IV SOLR
INTRAVENOUS | Status: DC | PRN
Start: 2014-06-01 — End: 2014-06-01
  Administered 2014-06-01: 2 g via INTRAVENOUS

## 2014-06-01 MED ORDER — LACTATED RINGERS IV SOLN
INTRAVENOUS | Status: DC | PRN
  Administered 2014-06-01: 15:00:00 via INTRAVENOUS

## 2014-06-01 MED ORDER — MIDAZOLAM HCL 2 MG/2ML IJ SOLN
INTRAMUSCULAR | Status: AC
  Filled 2014-06-01: qty 2

## 2014-06-01 MED ORDER — 0.9 % SODIUM CHLORIDE (POUR BTL) OPTIME
TOPICAL | Status: DC | PRN
  Administered 2014-06-01: 1000 mL

## 2014-06-01 MED ORDER — LACTATED RINGERS IV SOLN
INTRAVENOUS | Status: DC
  Administered 2014-06-01: 14:00:00 via INTRAVENOUS

## 2014-06-01 MED ORDER — ROCURONIUM BROMIDE 50 MG/5ML IV SOLN
INTRAVENOUS | Status: AC
  Filled 2014-06-01: qty 1

## 2014-06-01 MED ORDER — HYDROMORPHONE HCL 1 MG/ML IJ SOLN: 0.2500 mg | INTRAMUSCULAR | Status: DC | PRN

## 2014-06-01 MED ORDER — GLYCOPYRROLATE 0.2 MG/ML IJ SOLN
INTRAMUSCULAR | Status: AC
  Filled 2014-06-01: qty 4

## 2014-06-01 MED ORDER — HYDRALAZINE HCL 20 MG/ML IJ SOLN
10.0000 mg | INTRAMUSCULAR | Status: DC | PRN
  Administered 2014-06-02: 10 mg via INTRAVENOUS
  Filled 2014-06-01: qty 1

## 2014-06-01 MED ORDER — FENTANYL CITRATE 0.05 MG/ML IJ SOLN
INTRAMUSCULAR | Status: AC
  Filled 2014-06-01: qty 5

## 2014-06-01 MED ORDER — FENTANYL CITRATE 0.05 MG/ML IJ SOLN
INTRAMUSCULAR | Status: AC
  Filled 2014-06-01: qty 2

## 2014-06-01 MED ORDER — ONDANSETRON HCL 4 MG/2ML IJ SOLN
INTRAMUSCULAR | Status: DC | PRN
Start: 2014-06-01 — End: 2014-06-01
  Administered 2014-06-01: 4 mg via INTRAVENOUS

## 2014-06-01 MED ORDER — ALBUTEROL SULFATE HFA 108 (90 BASE) MCG/ACT IN AERS
INHALATION_SPRAY | RESPIRATORY_TRACT | Status: DC | PRN
  Administered 2014-06-01: 2 via RESPIRATORY_TRACT

## 2014-06-01 MED ORDER — ALBUTEROL SULFATE (2.5 MG/3ML) 0.083% IN NEBU: 2.5000 mg | INHALATION_SOLUTION | RESPIRATORY_TRACT | Status: DC | PRN

## 2014-06-01 MED ORDER — HYDROMORPHONE HCL 1 MG/ML IJ SOLN: 0.5000 mg | INTRAMUSCULAR | Status: DC | PRN

## 2014-06-01 MED ORDER — FENTANYL CITRATE 0.05 MG/ML IJ SOLN
INTRAMUSCULAR | Status: DC | PRN
  Administered 2014-06-01 (×2): 25 ug via INTRAVENOUS
  Administered 2014-06-01: 50 ug via INTRAVENOUS
  Administered 2014-06-01 (×2): 25 ug via INTRAVENOUS

## 2014-06-01 MED ORDER — MIDAZOLAM HCL 5 MG/5ML IJ SOLN
INTRAMUSCULAR | Status: DC | PRN
  Administered 2014-06-01: 1 mg via INTRAVENOUS

## 2014-06-01 MED ORDER — CEFAZOLIN SODIUM-DEXTROSE 2-3 GM-% IV SOLR
INTRAVENOUS | Status: AC
  Filled 2014-06-01: qty 50

## 2014-06-01 MED ORDER — BUPIVACAINE HCL (PF) 0.25 % IJ SOLN
INTRAMUSCULAR | Status: AC
  Filled 2014-06-01: qty 30

## 2014-06-01 MED ORDER — DEXAMETHASONE SODIUM PHOSPHATE 4 MG/ML IJ SOLN
INTRAMUSCULAR | Status: AC
  Filled 2014-06-01: qty 1

## 2014-06-01 MED ORDER — NEOSTIGMINE METHYLSULFATE 10 MG/10ML IV SOLN
INTRAVENOUS | Status: AC
  Filled 2014-06-01: qty 2

## 2014-06-01 MED ORDER — BUPIVACAINE HCL (PF) 0.25 % IJ SOLN
INTRAMUSCULAR | Status: DC | PRN
  Administered 2014-06-01: 20 mL

## 2014-06-01 MED ORDER — IPRATROPIUM-ALBUTEROL 0.5-2.5 (3) MG/3ML IN SOLN
3.0000 mL | Freq: Three times a day (TID) | RESPIRATORY_TRACT | Status: DC
  Administered 2014-06-01 – 2014-06-02 (×3): 3 mL via RESPIRATORY_TRACT
  Filled 2014-06-01 (×2): qty 3

## 2014-06-01 SURGICAL SUPPLY — 63 items
BANDAGE ELASTIC 4 VELCRO ST LF (GAUZE/BANDAGES/DRESSINGS) ×3 IMPLANT
BANDAGE ELASTIC 6 VELCRO ST LF (GAUZE/BANDAGES/DRESSINGS) ×3 IMPLANT
BANDAGE ESMARK 6X9 LF (GAUZE/BANDAGES/DRESSINGS) ×1 IMPLANT
BIT DRILL 2.5X2.75 QC CALB (BIT) ×3 IMPLANT
BNDG ESMARK 6X9 LF (GAUZE/BANDAGES/DRESSINGS) ×3
COVER MAYO STAND STRL (DRAPES) IMPLANT
COVER SURGICAL LIGHT HANDLE (MISCELLANEOUS) ×3 IMPLANT
CUFF TOURNIQUET SINGLE 34IN LL (TOURNIQUET CUFF) ×3 IMPLANT
CUFF TOURNIQUET SINGLE 44IN (TOURNIQUET CUFF) IMPLANT
DRAPE C-ARM 42X72 X-RAY (DRAPES) ×3 IMPLANT
DRAPE INCISE IOBAN 66X45 STRL (DRAPES) ×3 IMPLANT
DRAPE PROXIMA HALF (DRAPES) ×3 IMPLANT
DRAPE U-SHAPE 47X51 STRL (DRAPES) ×3 IMPLANT
DRSG PAD ABDOMINAL 8X10 ST (GAUZE/BANDAGES/DRESSINGS) ×3 IMPLANT
DURAPREP 26ML APPLICATOR (WOUND CARE) ×3 IMPLANT
ELECT REM PT RETURN 9FT ADLT (ELECTROSURGICAL) ×3
ELECTRODE REM PT RTRN 9FT ADLT (ELECTROSURGICAL) ×1 IMPLANT
GAUZE SPONGE 4X4 12PLY STRL (GAUZE/BANDAGES/DRESSINGS) ×3 IMPLANT
GAUZE XEROFORM 1X8 LF (GAUZE/BANDAGES/DRESSINGS) ×3 IMPLANT
GAUZE XEROFORM 5X9 LF (GAUZE/BANDAGES/DRESSINGS) ×3 IMPLANT
GLOVE BIOGEL PI IND STRL 7.5 (GLOVE) ×1 IMPLANT
GLOVE BIOGEL PI IND STRL 8 (GLOVE) ×1 IMPLANT
GLOVE BIOGEL PI INDICATOR 7.5 (GLOVE) ×2
GLOVE BIOGEL PI INDICATOR 8 (GLOVE) ×2
GLOVE ECLIPSE 7.0 STRL STRAW (GLOVE) ×3 IMPLANT
GLOVE ORTHO TXT STRL SZ7.5 (GLOVE) ×3 IMPLANT
GOWN STRL REUS W/ TWL LRG LVL3 (GOWN DISPOSABLE) ×2 IMPLANT
GOWN STRL REUS W/ TWL XL LVL3 (GOWN DISPOSABLE) ×1 IMPLANT
GOWN STRL REUS W/TWL LRG LVL3 (GOWN DISPOSABLE) ×4
GOWN STRL REUS W/TWL XL LVL3 (GOWN DISPOSABLE) ×2
KIT BASIN OR (CUSTOM PROCEDURE TRAY) ×3 IMPLANT
KIT ROOM TURNOVER OR (KITS) ×3 IMPLANT
MANIFOLD NEPTUNE II (INSTRUMENTS) IMPLANT
NS IRRIG 1000ML POUR BTL (IV SOLUTION) ×3 IMPLANT
PACK ORTHO EXTREMITY (CUSTOM PROCEDURE TRAY) ×3 IMPLANT
PAD ARMBOARD 7.5X6 YLW CONV (MISCELLANEOUS) ×6 IMPLANT
PAD CAST 4YDX4 CTTN HI CHSV (CAST SUPPLIES) ×1 IMPLANT
PADDING CAST COTTON 4X4 STRL (CAST SUPPLIES) ×2
PADDING CAST COTTON 6X4 STRL (CAST SUPPLIES) ×6 IMPLANT
PLATE LOCK 7H 92 BILAT FIB (Plate) ×3 IMPLANT
SCREW ACE CAN 4.0 40M (Screw) ×6 IMPLANT
SCREW CORT T15 24X3.5XST LCK (Screw) ×1 IMPLANT
SCREW CORTICAL 3.5X24MM (Screw) ×2 IMPLANT
SCREW LOCK CORT STAR 3.5X10 (Screw) ×3 IMPLANT
SCREW LOCK CORT STAR 3.5X12 (Screw) ×3 IMPLANT
SCREW LOCK CORT STAR 3.5X14 (Screw) ×6 IMPLANT
SCREW NON LOCKING LP 3.5 14MM (Screw) ×6 IMPLANT
SCREW NON LOCKING LP 3.5 16MM (Screw) ×3 IMPLANT
SPLINT FIBERGLASS 3X35 (CAST SUPPLIES) ×3 IMPLANT
SPONGE GAUZE 4X4 12PLY STER LF (GAUZE/BANDAGES/DRESSINGS) ×3 IMPLANT
SPONGE LAP 18X18 X RAY DECT (DISPOSABLE) IMPLANT
STAPLER VISISTAT 35W (STAPLE) ×3 IMPLANT
SUCTION FRAZIER TIP 10 FR DISP (SUCTIONS) ×3 IMPLANT
SUT ETHILON 3 0 PS 1 (SUTURE) ×6 IMPLANT
SUT VIC AB 2-0 CT1 27 (SUTURE) ×4
SUT VIC AB 2-0 CT1 TAPERPNT 27 (SUTURE) ×2 IMPLANT
TOWEL OR 17X24 6PK STRL BLUE (TOWEL DISPOSABLE) ×3 IMPLANT
TOWEL OR 17X26 10 PK STRL BLUE (TOWEL DISPOSABLE) ×3 IMPLANT
TUBE CONNECTING 12'X1/4 (SUCTIONS) ×1
TUBE CONNECTING 12X1/4 (SUCTIONS) ×2 IMPLANT
WATER STERILE IRR 1000ML POUR (IV SOLUTION) IMPLANT
WIRE K 1.6MM 144256 (MISCELLANEOUS) ×3 IMPLANT
YANKAUER SUCT BULB TIP NO VENT (SUCTIONS) ×3 IMPLANT

## 2014-06-01 NOTE — Progress Notes (Signed)
Echocardiogram 2D Echocardiogram has been performed.  Falck, Ananya Mccleese 06/01/2014, 10:50 AM

## 2014-06-01 NOTE — Anesthesia Preprocedure Evaluation (Addendum)
Anesthesia Evaluation  Patient identified by MRN, date of birth, ID band Patient awake    Reviewed: Allergy & Precautions, H&P , NPO status , Patient's Chart, lab work & pertinent test results  Airway        Dental no notable dental hx.    Pulmonary COPD COPD inhaler,    Pulmonary exam normal       Cardiovascular hypertension, Pt. on medications     Neuro/Psych negative neurological ROS  negative psych ROS   GI/Hepatic negative GI ROS, Neg liver ROS,   Endo/Other  negative endocrine ROS  Renal/GU negative Renal ROS  negative genitourinary   Musculoskeletal   Abdominal   Peds  Hematology negative hematology ROS (+)   Anesthesia Other Findings   Reproductive/Obstetrics negative OB ROS                            Anesthesia Physical Anesthesia Plan  ASA: II  Anesthesia Plan: General and Regional   Post-op Pain Management: MAC Combined w/ Regional for Post-op pain   Induction: Intravenous  Airway Management Planned: LMA  Additional Equipment:   Intra-op Plan:   Post-operative Plan: Extubation in OR  Informed Consent: I have reviewed the patients History and Physical, chart, labs and discussed the procedure including the risks, benefits and alternatives for the proposed anesthesia with the patient or authorized representative who has indicated his/her understanding and acceptance.   Dental advisory given  Plan Discussed with: CRNA  Anesthesia Plan Comments:        Anesthesia Quick Evaluation

## 2014-06-01 NOTE — Anesthesia Postprocedure Evaluation (Signed)
  Anesthesia Post-op Note  Patient: Amanda Palmer  Procedure(s) Performed: Procedure(s): OPEN REDUCTION INTERNAL FIXATION (ORIF) LEFT ANKLE FRACTURE (Left)  Patient Location: PACU  Anesthesia Type: General, Regional   Level of Consciousness: awake, alert  and oriented  Airway and Oxygen Therapy: Patient Spontanous Breathing  Post-op Pain: none  Post-op Assessment: Post-op Vital signs reviewed  Post-op Vital Signs: Reviewed  Last Vitals:  Filed Vitals:   06/01/14 1710  BP: 165/69  Pulse: 112  Temp: 36.8 C  Resp: 20    Complications: No apparent anesthesia complications

## 2014-06-01 NOTE — Progress Notes (Signed)
PT Cancellation Note  Patient Details Name: Amanda Palmer MRN: [id number] DOB: Jan 08, 1934   Cancelled Treatment:    Reason Eval/Treat Not Completed: Medical issues which prohibited therapy. Pt planned for OR this afternoon pending chest x-ray. Will re-attempt to see pt when surgery is complete or at next available time.    [name] [place], [place] 06/01/2014, 10:19 AM

## 2014-06-01 NOTE — Brief Op Note (Signed)
05/30/2014 - 06/01/2014  5:11 PM  PATIENT:  Amanda Palmer  81 y.o. female  PRE-OPERATIVE DIAGNOSIS:  Left Trimalleolar Ankle Fracture  POST-OPERATIVE DIAGNOSIS:  Left Trimalleolar Ankle Fracture  PROCEDURE:  Procedure(s): OPEN REDUCTION INTERNAL FIXATION (ORIF) LEFT ANKLE FRACTURE (Left)  SURGEON:  Surgeon(s) and Role:    Marybelle Killings, MD - Primary  PHYSICIAN ASSISTANT: Ayleah Hofmeister m. Gregoria Selvy pa-c   ANESTHESIA:   general  EBL:  Total I/O In: 200 [I.V.:200] Out: -   BLOOD ADMINISTERED:none  DRAINS: none   LOCAL MEDICATIONS USED:  MARCAINE     SPECIMEN:  No Specimen  DISPOSITION OF SPECIMEN:  N/A  COUNTS:  YES  TOURNIQUET:   Total Tourniquet Time Documented: Thigh (Left) - 26 minutes Total: Thigh (Left) - 26 minutes    PATIENT DISPOSITION:  PACU - hemodynamically stable.

## 2014-06-01 NOTE — Transfer of Care (Signed)
Immediate Anesthesia Transfer of Care Note  Patient: Amanda Palmer  Procedure(s) Performed: Procedure(s): OPEN REDUCTION INTERNAL FIXATION (ORIF) LEFT ANKLE FRACTURE (Left)  Patient Location: PACU  Anesthesia Type:General and Regional  Level of Consciousness: awake, alert  and oriented  Airway & Oxygen Therapy: Patient Spontanous Breathing and Patient connected to face mask oxygen  Post-op Assessment: Report given to RN and Post -op Vital signs reviewed and stable  Post vital signs: Reviewed and stable  Last Vitals:  Filed Vitals:   06/01/14 1710  BP:   Pulse: 112  Temp: 36.8 C  Resp:     Complications: No apparent anesthesia complications

## 2014-06-01 NOTE — Interval H&P Note (Signed)
History and Physical Interval Note:  06/01/2014 2:41 PM  Amanda Palmer  has presented today for surgery, with the diagnosis of Left Trimalleolar Ankle Fracture  The various methods of treatment have been discussed with the patient and family. After consideration of risks, benefits and other options for treatment, the patient has consented to  Procedure(s): OPEN REDUCTION INTERNAL FIXATION (ORIF) LEFT ANKLE FRACTURE (Left) as a surgical intervention .  The patient's history has been reviewed, patient examined, no change in status, stable for surgery.  I have reviewed the patient's chart and labs.  Questions were answered to the patient's satisfaction.     Patrecia Veiga C

## 2014-06-01 NOTE — Progress Notes (Signed)
OT Cancellation Note  Patient Details Name: INA SCRIVENS MRN: [id number] DOB: Jul 19, 1933   Cancelled Treatment:    Reason Eval/Treat Not Completed: Other (comment). Pt planned for OR this afternoon pending chest x-ray. Will re-attempt to see pt when surgery is complete or at next available time.   Benito Mccreedy OTR/L [phone] 06/01/2014, 11:46 AM

## 2014-06-01 NOTE — Progress Notes (Signed)
PATIENT DETAILS Name: Amanda Palmer Age: 81 y.o. Sex: female Date of Birth: 08-10-1933 Admit Date: 05/30/2014 Admitting Physician Rise Patience, MD ZOX:WRUE, [facility], MD  Subjective: Just got back from [place] drowsy-but answers questions appropriately  Assessment/Plan: Principal Problem:   Syncope and collapse:orthostatics positive on 3/22-likely etiology. Continue to hold Losartan/HCTZ. Echo shows preserved EF, troponin negative. Tele neg.  Active Problems:    Trimalleolar fracture of ankle, closed:Ortho consulted, underwent ORIF on 3/23. Post op care deferred to Ortho.     AVW:UJWJXBJ pre-renal azotemia in a setting of diuretic use. Stop Losartan/HCTZ, cautiously hydrate and recheck lytes.       COPD (chronic obstructive pulmonary disease):lungs clear. Continue nebs, add incentive spirometry.     Chronic Resp Failure:on home O2 2l/m-Continue    HTN (hypertension): BP currently controlled without any anti-hypertensives. Follow     Scalp abrasion - closely observe.    GERD:Stable, continue PPI      Lung nodule seen in CT scan - patient will need close follow-up once discharged  Disposition: Remain inpatient-suspect SNF on discharge  Antibiotics:  None   Anti-infectives    None      DVT Prophylaxis:  SCD's  Code Status: Full code   Family Communication None at bedside  Procedures:  ORIF 3/23  CONSULTS:  orthopedic surgery  Time spent 40 minutes-which includes 50% of the time with face-to-face with patient/ family and coordinating care related to the above assessment and plan.  MEDICATIONS: Scheduled Meds: . arformoterol  15 mcg Nebulization BID  . atorvastatin  20 mg Oral QPM  . budesonide  0.25 mg Nebulization BID  . clonazePAM  1 mg Oral QHS  . gabapentin  300 mg Oral QHS  . montelukast  10 mg Oral Daily  . pantoprazole  40 mg Oral Daily  . sodium chloride  3 mL Intravenous Q12H  . venlafaxine  75 mg Oral TID WC    Continuous Infusions: . lactated ringers 50 mL/hr at 06/01/14 1428   PRN Meds:.acetaminophen **OR** acetaminophen, albuterol, hydrALAZINE, HYDROcodone-acetaminophen, HYDROmorphone (DILAUDID) injection, nitroGLYCERIN, ondansetron **OR** ondansetron (ZOFRAN) IV, sodium chloride    PHYSICAL EXAM: Vital signs in last 24 hours: Filed Vitals:   06/01/14 1710 06/01/14 1725 06/01/14 1740 06/01/14 1755  BP: 165/69 133/59 121/45 118/43  Pulse: 112 105 102 95  Temp: 98.2 F (36.8 C)     TempSrc:      Resp: 20 22 19 14   Height:      Weight:      SpO2: 100% 100% 100% 100%    Weight change:  Filed Weights   05/30/14 2332  Weight: 65.318 kg (144 lb)   Body mass index is 24.71 kg/(m^2).   Gen Exam: Awake and alert with clear speech.   Neck: Supple, No JVD.   Chest: B/L Clear.   CVS: S1 S2 Regular, no murmurs.  Abdomen: soft, BS +, non tender, non distended.  Neurologic: Non Focal.   Skin: No Rash.   Wounds: N/A.   Intake/Output from previous day:  Intake/Output Summary (Last 24 hours) at 06/01/14 1832 Last data filed at 06/01/14 1539  Gross per 24 hour  Intake    200 ml  Output      0 ml  Net    200 ml     LAB RESULTS: CBC  Recent Labs Lab 05/30/14 2350 05/31/14 0557  WBC 12.2* 11.6*  HGB 12.6 11.3*  HCT 38.9 35.9*  PLT 288 262  MCV 93.1 93.7  MCH 30.1 29.5  MCHC 32.4 31.5  RDW 13.9 14.0  LYMPHSABS 1.9 2.1  MONOABS 0.7 0.7  EOSABS 0.2 0.2  BASOSABS 0.0 0.0    Chemistries   Recent Labs Lab 05/30/14 2350 05/31/14 0557  NA 136 137  K 3.8 4.0  CL 93* 100  CO2 34* 28  GLUCOSE 156* 130*  BUN 27* 27*  CREATININE 1.51* 1.21*  CALCIUM 9.5 8.8    CBG: No results for input(s): GLUCAP in the last 168 hours.  GFR Estimated Creatinine Clearance: 33.1 mL/min (by C-G formula based on Cr of 1.21).  Coagulation profile No results for input(s): INR, PROTIME in the last 168 hours.  Cardiac Enzymes  Recent Labs Lab 05/31/14 0557  TROPONINI <0.03     Invalid input(s): POCBNP No results for input(s): DDIMER in the last 72 hours. No results for input(s): HGBA1C in the last 72 hours. No results for input(s): CHOL, HDL, LDLCALC, TRIG, CHOLHDL, LDLDIRECT in the last 72 hours.  Recent Labs  05/31/14 1453  TSH 2.522   No results for input(s): VITAMINB12, FOLATE, FERRITIN, TIBC, IRON, RETICCTPCT in the last 72 hours. No results for input(s): LIPASE, AMYLASE in the last 72 hours.  Urine Studies No results for input(s): UHGB, CRYS in the last 72 hours.  Invalid input(s): UACOL, UAPR, USPG, UPH, UTP, UGL, UKET, UBIL, UNIT, UROB, ULEU, UEPI, UWBC, URBC, UBAC, CAST, UCOM, BILUA  MICROBIOLOGY: Recent Results (from the past 240 hour(s))  Surgical pcr screen     Status: None   Collection Time: 06/01/14  1:17 AM  Result Value Ref Range Status   MRSA, PCR NEGATIVE NEGATIVE Final   Staphylococcus aureus NEGATIVE NEGATIVE Final    Comment:        The Xpert SA Assay (FDA approved for NASAL specimens in patients over 34 years of age), is one component of a comprehensive surveillance program.  Test performance has been validated by [facility] for patients greater than or equal to 72 year old. It is not intended to diagnose infection nor to guide or monitor treatment.     RADIOLOGY STUDIES/RESULTS: Dg Chest 2 View  05/22/2014   CLINICAL DATA:  Acute onset of shortness of breath and congestion, with persistent cough. Initial encounter.  EXAM: CHEST  2 VIEW  COMPARISON:  None.  FINDINGS: The lungs are well-aerated. The lungs are hyperexpanded, with flattening of the hemidiaphragms, likely reflecting COPD. There is no evidence of focal opacification, pleural effusion or pneumothorax.  The heart is normal in size; the mediastinal contour is within normal limits. No acute osseous abnormalities are seen.  IMPRESSION: Findings of COPD.  No acute cardiopulmonary process seen.   Electronically Signed   By: Garald Balding M.D.   On: 05/22/2014  01:27   Dg Knee 2 Views Right  05/31/2014   CLINICAL DATA:  [place]  EXAM: RIGHT KNEE - 1-2 VIEW  COMPARISON:  None.  FINDINGS: There are intact appearances of the total knee arthroplasty. There is no fracture or dislocation. There is no soft tissue foreign body.  IMPRESSION: Negative.   Electronically Signed   By: Andreas Newport M.D.   On: 05/31/2014 01:58   Dg Ankle Complete Left  06/01/2014   CLINICAL DATA:  Open reduction internal fixation for fracture  EXAM: LEFT ANKLE COMPLETE - 3+ VIEW; DG C-ARM 61-120 MIN  FLUOROSCOPY TIME:  0 minutes 8 seconds; three views  COMPARISON:  Preoperative study May 31, 2014  FINDINGS: Frontal, oblique, and lateral  views were obtained. There are 2 screws transfixing a fracture of the medial malleolus. There is screw and plate fixation in the distal fibular region. Alignment in these areas is essentially anatomic. The ankle mortise appears intact.  IMPRESSION: Postoperative change in the medial malleolus and distal fibula with alignment essentially anatomic in these areas. Ankle mortise appears intact.   Electronically Signed   By: Lowella Grip III M.D.   On: 06/01/2014 17:15   Dg Ankle Complete Left  05/31/2014   CLINICAL DATA:  [place]  EXAM: LEFT ANKLE COMPLETE - 3+ VIEW  COMPARISON:  None.  FINDINGS: Three views of the left ankle obtained through fiberglass demonstrate an oblique distal fibular fracture with a little more than 1 cortex width posterolateral displacement. There is a transverse medial malleolar fracture with moderate distraction. There is a vertical posterior malleolar fracture with 1 cortex width displacement. There is disruption of the mortise with a symmetric medial widening and mild valgus.  IMPRESSION: Trimalleolar fracture.  Mild valgus.   Electronically Signed   By: Andreas Newport M.D.   On: 05/31/2014 02:00   Ct Head Wo Contrast  05/31/2014   CLINICAL DATA:  Initial evaluation for acute trauma, fall.  EXAM: CT HEAD WITHOUT CONTRAST  CT  CERVICAL SPINE WITHOUT CONTRAST  TECHNIQUE: Multidetector CT imaging of the head and cervical spine was performed following the standard protocol without intravenous contrast. Multiplanar CT image reconstructions of the cervical spine were also generated.  COMPARISON:  None.  FINDINGS: CT HEAD FINDINGS  Diffuse prominence of the CSF containing spaces compatible with generalized cerebral atrophy. Patchy and confluent hypodensity within the periventricular and deep white matter both cerebral hemispheres most consistent with chronic small vessel ischemic changes.  No acute intracranial hemorrhage or large vessel territory infarct. No extra-axial fluid collection. No mass lesion or midline shift. No hydrocephalus.  Scalp soft tissues within normal limits.  Calvarium intact.  No acute abnormality seen about the orbits.  Scattered mucoperiosteal thickening present on about the ethmoidal air cells as well as the right maxillary and sphenoid sinuses. No mastoid effusion.  CT CERVICAL SPINE FINDINGS  There is trace anterior listhesis of C4 on C5, C5 on C6, and C6 on C7 Facet arthrosis at these levels suggest that this is chronic in nature. No fracture or acute listhesis. Normal C1-2 articulations intact. Vertebral body heights preserved.  Prevertebral soft tissues normal.  Mild multilevel degenerative disc disease is evidenced by intervertebral disc space narrowing and endplate osteophytosis is present, most prevalent at C4-5.  No acute soft tissue abnormality within the neck. Partially visualized lungs are clear. 5 mm left upper lobe nodule noted.  IMPRESSION: CT BRAIN:  1. No acute intracranial process. 2. Atrophy with chronic microvascular ischemic disease.  CT CERVICAL SPINE:  1. No acute traumatic injury within the cervical spine. 2. Chronic degenerative anterolisthesis of C4 on C5, C5 on C6 common C6 on C7. 3. 5 mm left upper lobe nodule, indeterminate. If the patient is at high risk for bronchogenic carcinoma,  follow-up chest CT at 6-12 months is recommended. If the patient is at low risk for bronchogenic carcinoma, follow-up chest CT at 12 months is recommended. This recommendation follows the consensus statement: Guidelines for Management of Small Pulmonary Nodules Detected on CT Scans: A Statement from the [place] as published in Radiology 2005;237:395-400.   Electronically Signed   By: Jeannine Boga M.D.   On: 05/31/2014 02:21   Ct Cervical Spine Wo Contrast  05/31/2014   CLINICAL  DATA:  Initial evaluation for acute trauma, fall.  EXAM: CT HEAD WITHOUT CONTRAST  CT CERVICAL SPINE WITHOUT CONTRAST  TECHNIQUE: Multidetector CT imaging of the head and cervical spine was performed following the standard protocol without intravenous contrast. Multiplanar CT image reconstructions of the cervical spine were also generated.  COMPARISON:  None.  FINDINGS: CT HEAD FINDINGS  Diffuse prominence of the CSF containing spaces compatible with generalized cerebral atrophy. Patchy and confluent hypodensity within the periventricular and deep white matter both cerebral hemispheres most consistent with chronic small vessel ischemic changes.  No acute intracranial hemorrhage or large vessel territory infarct. No extra-axial fluid collection. No mass lesion or midline shift. No hydrocephalus.  Scalp soft tissues within normal limits.  Calvarium intact.  No acute abnormality seen about the orbits.  Scattered mucoperiosteal thickening present on about the ethmoidal air cells as well as the right maxillary and sphenoid sinuses. No mastoid effusion.  CT CERVICAL SPINE FINDINGS  There is trace anterior listhesis of C4 on C5, C5 on C6, and C6 on C7 Facet arthrosis at these levels suggest that this is chronic in nature. No fracture or acute listhesis. Normal C1-2 articulations intact. Vertebral body heights preserved.  Prevertebral soft tissues normal.  Mild multilevel degenerative disc disease is evidenced by intervertebral  disc space narrowing and endplate osteophytosis is present, most prevalent at C4-5.  No acute soft tissue abnormality within the neck. Partially visualized lungs are clear. 5 mm left upper lobe nodule noted.  IMPRESSION: CT BRAIN:  1. No acute intracranial process. 2. Atrophy with chronic microvascular ischemic disease.  CT CERVICAL SPINE:  1. No acute traumatic injury within the cervical spine. 2. Chronic degenerative anterolisthesis of C4 on C5, C5 on C6 common C6 on C7. 3. 5 mm left upper lobe nodule, indeterminate. If the patient is at high risk for bronchogenic carcinoma, follow-up chest CT at 6-12 months is recommended. If the patient is at low risk for bronchogenic carcinoma, follow-up chest CT at 12 months is recommended. This recommendation follows the consensus statement: Guidelines for Management of Small Pulmonary Nodules Detected on CT Scans: A Statement from the [place] as published in Radiology 2005;237:395-400.   Electronically Signed   By: Jeannine Boga M.D.   On: 05/31/2014 02:21   Dg Chest Port 1 View  06/01/2014   CLINICAL DATA:  Cough and history of COPD.  EXAM: PORTABLE CHEST - 1 VIEW  COMPARISON:  05/30/2014  FINDINGS: The heart size and mediastinal contours are within normal limits. Stable mild chronic lung disease and minimal bibasilar scarring. No significant hyperinflation. There is no evidence of pulmonary edema, consolidation, pneumothorax, nodule or pleural fluid. The visualized skeletal structures are unremarkable.  IMPRESSION: No active disease.   Electronically Signed   By: Aletta Edouard M.D.   On: 06/01/2014 08:49   Dg Chest Portable 1 View  05/31/2014   CLINICAL DATA:  Status post fall. Shortness of breath and cough. Initial encounter.  EXAM: PORTABLE CHEST - 1 VIEW  COMPARISON:  Chest radiograph performed 05/22/2014  FINDINGS: The lungs are well-aerated. Minimal bibasilar atelectasis is noted. There is no evidence of pleural effusion or pneumothorax.  The  cardiomediastinal silhouette is within normal limits. No acute osseous abnormalities are seen. Subcortical cystic changes are seen at both humeral heads, with superior subluxation of the left humeral head, reflecting underlying chronic rotator cuff tear.  IMPRESSION: Minimal bibasilar atelectasis noted; lungs otherwise clear. No displaced rib fracture seen.   Electronically Signed   By: Jacqulynn Cadet  Chang M.D.   On: 05/31/2014 00:32   Dg C-arm 1-60 Min  06/01/2014   CLINICAL DATA:  Open reduction internal fixation for fracture  EXAM: LEFT ANKLE COMPLETE - 3+ VIEW; DG C-ARM 61-120 MIN  FLUOROSCOPY TIME:  0 minutes 8 seconds; three views  COMPARISON:  Preoperative study May 31, 2014  FINDINGS: Frontal, oblique, and lateral views were obtained. There are 2 screws transfixing a fracture of the medial malleolus. There is screw and plate fixation in the distal fibular region. Alignment in these areas is essentially anatomic. The ankle mortise appears intact.  IMPRESSION: Postoperative change in the medial malleolus and distal fibula with alignment essentially anatomic in these areas. Ankle mortise appears intact.   Electronically Signed   By: Lowella Grip III M.D.   On: 06/01/2014 17:15    Oren Binet, MD  [facility] Pager:[phone]  If 7PM-7AM, please contact night-coverage [email or web address] Password [facility] 06/01/2014, 6:32 PM   LOS: 1 day

## 2014-06-01 NOTE — Anesthesia Procedure Notes (Addendum)
Anesthesia Regional Block:  Popliteal block  Pre-Anesthetic Checklist: ,, timeout performed, Correct Patient, Correct Site, Correct Laterality, Correct Procedure, Correct Position, site marked, Risks and benefits discussed,  Surgical consent,  Pre-op evaluation,  At surgeon's request and post-op pain management  Laterality: Left and Lower  Prep: Maximum Sterile Barrier Precautions used and chloraprep       Needles:  Injection technique: Single-shot  Needle Type: Echogenic Stimulator Needle     Needle Length: 10cm 10 cm Needle Gauge: 21 and 21 G    Additional Needles:  Procedures: ultrasound guided (picture in chart) and nerve stimulator Popliteal block  Nerve Stimulator or Paresthesia:  Response: 0.4 mA,   Additional Responses:   Narrative:  Start time: 06/01/2014 3:01 PM End time: 06/01/2014 3:12 PM Injection made incrementally with aspirations every 5 mL.  Additional Notes: L popliteal block with [place] and stim.  8ml of .5% marcaine with epi, multiple asp, talked to patient throughout, no complications   Procedure Name: LMA Insertion Date/Time: 06/01/2014 3:47 PM Performed by: Clearnce Sorrel Pre-anesthesia Checklist: Patient identified, Emergency Drugs available, Suction available, Patient being monitored and Timeout performed Patient Re-evaluated:Patient Re-evaluated prior to inductionOxygen Delivery Method: Circle system utilized Preoxygenation: Pre-oxygenation with 100% oxygen Intubation Type: IV induction LMA: LMA inserted LMA Size: 4.0 Number of attempts: 1 Placement Confirmation: positive ETCO2 and breath sounds checked- equal and bilateral Tube secured with: Tape Dental Injury: Teeth and Oropharynx as per pre-operative assessment

## 2014-06-01 NOTE — Progress Notes (Signed)
Subjective: C/o of a more productive cough this morning.  No chest pain.  OR today for ORIF.     Objective: Vital signs in last 24 hours: Temp:  [97.8 F (36.6 C)-98 F (36.7 C)] 97.9 F (36.6 C) (03/23 0500) Pulse Rate:  [86-100] 100 (03/23 0500) Resp:  [16] 16 (03/23 0500) BP: (104-109)/(33-39) 104/39 mmHg (03/23 0500) SpO2:  [96 %-100 %] 100 % (03/23 0500)  Intake/Output from previous day: 03/22 0701 - 03/23 0700 In: 360 [P.O.:360] Out: -  Intake/Output this shift:     Recent Labs  05/30/14 2350 05/31/14 0557  HGB 12.6 11.3*    Recent Labs  05/30/14 2350 05/31/14 0557  WBC 12.2* 11.6*  RBC 4.18 3.83*  HCT 38.9 35.9*  PLT 288 262    Recent Labs  05/30/14 2350 05/31/14 0557  NA 136 137  K 3.8 4.0  CL 93* 100  CO2 34* 28  BUN 27* 27*  CREATININE 1.51* 1.21*  GLUCOSE 156* 130*  CALCIUM 9.5 8.8   No results for input(s): LABPT, INR in the last 72 hours.  Exam:  Coughing some while in room.  No respiratory distress.  Splint intact.  Moves toes well.    Assessment/Plan: Will get chest xray this AM.  Proceed as sched for OR today if stable per medicine service.     Barak Bialecki M 06/01/2014, 8:26 AM

## 2014-06-01 NOTE — H&P (View-Only) (Signed)
Patient ID: Amanda Palmer MRN: [id number] DOB/AGE: 79-May-1935 79 y.o.  Admit date: 05/30/2014  Admission Diagnoses:  Principal Problem:   Syncope and collapse Active Problems:   COPD (chronic obstructive pulmonary disease)   HTN (hypertension)   Trimalleolar fracture of ankle, closed   Scalp laceration   ARF (acute renal failure)   Syncope   HPI: 81 yo wf being seen at the request of hospitalist service for left trimalleolar ankle fracture.  States that she "blacked out" 30 May 2014 when she suffered the injury.  Was seen in the ED for evaluation.    Past Medical History: Past Medical History  Diagnosis Date  . Cancer     breast  . Hypertension   . COPD (chronic obstructive pulmonary disease)     Surgical History: Past Surgical History  Procedure Laterality Date  . Cardiac catheterization    . Total knee arthroplasty      bilateral  . Total hip arthroplasty      right  . Rotator cuff repair      left  . Mastectomy      right     Family History: History reviewed. No pertinent family history.  Social History: History   Social History  . Marital Status: Widowed    Spouse Name: N/A  . Number of Children: N/A  . Years of Education: N/A   Occupational History  . Not on file.   Social History Main Topics  . Smoking status: Never Smoker   . Smokeless tobacco: Never Used  . Alcohol Use: No  . Drug Use: No  . Sexual Activity: Not on file   Other Topics Concern  . Not on file   Social History Narrative    Allergies: Morphine and related; Sulfa antibiotics; Penicillins; and Prednisone  Medications: I have reviewed the patient's current medications.  Vital Signs: Patient Vitals for the past 24 hrs:  BP Temp Temp src Pulse Resp SpO2 Height Weight  05/31/14 0428 - - - - - 97 % - -  05/31/14 0425 (!) 109/45 mmHg 98.1 F (36.7 C) Oral 85 18 97 % - -  05/31/14 0338 (!) 103/45 mmHg - - 82 20 100 % - -  05/31/14 0329 (!) 96/38 mmHg - - 87 16  100 % - -  05/31/14 0035 (!) 96/40 mmHg - - 78 17 100 % - -  05/31/14 0032 (!) 105/42 mmHg - - 81 14 100 % - -  05/31/14 0030 (!) 106/37 mmHg - - 84 18 100 % - -  05/31/14 0027 (!) 106/40 mmHg - - 80 21 99 % - -  05/31/14 0026 (!) 104/39 mmHg - - 81 15 100 % - -  05/31/14 0024 (!) 106/41 mmHg - - 81 21 100 % - -  05/30/14 2332 (!) 95/45 mmHg 97.8 F (36.6 C) Oral 82 - 93 % 5\' 4"  (1.626 m) 65.318 kg (144 lb)    Radiology: Dg Chest 2 View  05/22/2014   CLINICAL DATA:  Acute onset of shortness of breath and congestion, with persistent cough. Initial encounter.  EXAM: CHEST  2 VIEW  COMPARISON:  None.  FINDINGS: The lungs are well-aerated. The lungs are hyperexpanded, with flattening of the hemidiaphragms, likely reflecting COPD. There is no evidence of focal opacification, pleural effusion or pneumothorax.  The heart is normal in size; the mediastinal contour is within normal limits. No acute osseous abnormalities are seen.  IMPRESSION: Findings of COPD.  No acute cardiopulmonary process  seen.   Electronically Signed   By: Garald Balding M.D.   On: 05/22/2014 01:27   Dg Knee 2 Views Right  05/31/2014   CLINICAL DATA:  [place]  EXAM: RIGHT KNEE - 1-2 VIEW  COMPARISON:  None.  FINDINGS: There are intact appearances of the total knee arthroplasty. There is no fracture or dislocation. There is no soft tissue foreign body.  IMPRESSION: Negative.   Electronically Signed   By: Andreas Newport M.D.   On: 05/31/2014 01:58   Dg Ankle Complete Left  05/31/2014   CLINICAL DATA:  [place]  EXAM: LEFT ANKLE COMPLETE - 3+ VIEW  COMPARISON:  None.  FINDINGS: Three views of the left ankle obtained through fiberglass demonstrate an oblique distal fibular fracture with a little more than 1 cortex width posterolateral displacement. There is a transverse medial malleolar fracture with moderate distraction. There is a vertical posterior malleolar fracture with 1 cortex width displacement. There is disruption of the mortise  with a symmetric medial widening and mild valgus.  IMPRESSION: Trimalleolar fracture.  Mild valgus.   Electronically Signed   By: Andreas Newport M.D.   On: 05/31/2014 02:00   Ct Head Wo Contrast  05/31/2014   CLINICAL DATA:  Initial evaluation for acute trauma, fall.  EXAM: CT HEAD WITHOUT CONTRAST  CT CERVICAL SPINE WITHOUT CONTRAST  TECHNIQUE: Multidetector CT imaging of the head and cervical spine was performed following the standard protocol without intravenous contrast. Multiplanar CT image reconstructions of the cervical spine were also generated.  COMPARISON:  None.  FINDINGS: CT HEAD FINDINGS  Diffuse prominence of the CSF containing spaces compatible with generalized cerebral atrophy. Patchy and confluent hypodensity within the periventricular and deep white matter both cerebral hemispheres most consistent with chronic small vessel ischemic changes.  No acute intracranial hemorrhage or large vessel territory infarct. No extra-axial fluid collection. No mass lesion or midline shift. No hydrocephalus.  Scalp soft tissues within normal limits.  Calvarium intact.  No acute abnormality seen about the orbits.  Scattered mucoperiosteal thickening present on about the ethmoidal air cells as well as the right maxillary and sphenoid sinuses. No mastoid effusion.  CT CERVICAL SPINE FINDINGS  There is trace anterior listhesis of C4 on C5, C5 on C6, and C6 on C7 Facet arthrosis at these levels suggest that this is chronic in nature. No fracture or acute listhesis. Normal C1-2 articulations intact. Vertebral body heights preserved.  Prevertebral soft tissues normal.  Mild multilevel degenerative disc disease is evidenced by intervertebral disc space narrowing and endplate osteophytosis is present, most prevalent at C4-5.  No acute soft tissue abnormality within the neck. Partially visualized lungs are clear. 5 mm left upper lobe nodule noted.  IMPRESSION: CT BRAIN:  1. No acute intracranial process. 2. Atrophy with  chronic microvascular ischemic disease.  CT CERVICAL SPINE:  1. No acute traumatic injury within the cervical spine. 2. Chronic degenerative anterolisthesis of C4 on C5, C5 on C6 common C6 on C7. 3. 5 mm left upper lobe nodule, indeterminate. If the patient is at high risk for bronchogenic carcinoma, follow-up chest CT at 6-12 months is recommended. If the patient is at low risk for bronchogenic carcinoma, follow-up chest CT at 12 months is recommended. This recommendation follows the consensus statement: Guidelines for Management of Small Pulmonary Nodules Detected on CT Scans: A Statement from the [place] as published in Radiology 2005;237:395-400.   Electronically Signed   By: Jeannine Boga M.D.   On: 05/31/2014 02:21  Ct Cervical Spine Wo Contrast  05/31/2014   CLINICAL DATA:  Initial evaluation for acute trauma, fall.  EXAM: CT HEAD WITHOUT CONTRAST  CT CERVICAL SPINE WITHOUT CONTRAST  TECHNIQUE: Multidetector CT imaging of the head and cervical spine was performed following the standard protocol without intravenous contrast. Multiplanar CT image reconstructions of the cervical spine were also generated.  COMPARISON:  None.  FINDINGS: CT HEAD FINDINGS  Diffuse prominence of the CSF containing spaces compatible with generalized cerebral atrophy. Patchy and confluent hypodensity within the periventricular and deep white matter both cerebral hemispheres most consistent with chronic small vessel ischemic changes.  No acute intracranial hemorrhage or large vessel territory infarct. No extra-axial fluid collection. No mass lesion or midline shift. No hydrocephalus.  Scalp soft tissues within normal limits.  Calvarium intact.  No acute abnormality seen about the orbits.  Scattered mucoperiosteal thickening present on about the ethmoidal air cells as well as the right maxillary and sphenoid sinuses. No mastoid effusion.  CT CERVICAL SPINE FINDINGS  There is trace anterior listhesis of C4 on C5, C5  on C6, and C6 on C7 Facet arthrosis at these levels suggest that this is chronic in nature. No fracture or acute listhesis. Normal C1-2 articulations intact. Vertebral body heights preserved.  Prevertebral soft tissues normal.  Mild multilevel degenerative disc disease is evidenced by intervertebral disc space narrowing and endplate osteophytosis is present, most prevalent at C4-5.  No acute soft tissue abnormality within the neck. Partially visualized lungs are clear. 5 mm left upper lobe nodule noted.  IMPRESSION: CT BRAIN:  1. No acute intracranial process. 2. Atrophy with chronic microvascular ischemic disease.  CT CERVICAL SPINE:  1. No acute traumatic injury within the cervical spine. 2. Chronic degenerative anterolisthesis of C4 on C5, C5 on C6 common C6 on C7. 3. 5 mm left upper lobe nodule, indeterminate. If the patient is at high risk for bronchogenic carcinoma, follow-up chest CT at 6-12 months is recommended. If the patient is at low risk for bronchogenic carcinoma, follow-up chest CT at 12 months is recommended. This recommendation follows the consensus statement: Guidelines for Management of Small Pulmonary Nodules Detected on CT Scans: A Statement from the [place] as published in Radiology 2005;237:395-400.   Electronically Signed   By: Jeannine Boga M.D.   On: 05/31/2014 02:21   Dg Chest Portable 1 View  05/31/2014   CLINICAL DATA:  Status post fall. Shortness of breath and cough. Initial encounter.  EXAM: PORTABLE CHEST - 1 VIEW  COMPARISON:  Chest radiograph performed 05/22/2014  FINDINGS: The lungs are well-aerated. Minimal bibasilar atelectasis is noted. There is no evidence of pleural effusion or pneumothorax.  The cardiomediastinal silhouette is within normal limits. No acute osseous abnormalities are seen. Subcortical cystic changes are seen at both humeral heads, with superior subluxation of the left humeral head, reflecting underlying chronic rotator cuff tear.   IMPRESSION: Minimal bibasilar atelectasis noted; lungs otherwise clear. No displaced rib fracture seen.   Electronically Signed   By: Garald Balding M.D.   On: 05/31/2014 00:32    Labs:  Recent Labs  05/30/14 2350 05/31/14 0557  WBC 12.2* 11.6*  RBC 4.18 3.83*  HCT 38.9 35.9*  PLT 288 262    Recent Labs  05/30/14 2350 05/31/14 0557  NA 136 137  K 3.8 4.0  CL 93* 100  CO2 34* 28  BUN 27* 27*  CREATININE 1.51* 1.21*  GLUCOSE 156* 130*  CALCIUM 9.5 8.8   No results for input(s):  LABPT, INR in the last 72 hours.  Review of Systems: Review of Systems  Constitutional: Negative.   Musculoskeletal: Positive for joint pain and falls.    Physical Exam: Sensation intact distally toes. Moves toes well.   Good capillary refill.   Splint intact.    Assessment and Plan: Left trimalleolar ankle fracture.  Advised patient that best treatment option at this point is ORIF procedure. Surgical procedure discussed.  Surgery tomorrow if cleared medically.  All questions answered.  NPO after midnight.    Alyson Locket. Ricard Dillon PA-C

## 2014-06-02 LAB — BASIC METABOLIC PANEL
ANION GAP: 7 (ref 5–15)
BUN: 12 mg/dL (ref 6–23)
CALCIUM: 8.8 mg/dL (ref 8.4–10.5)
CO2: 28 mmol/L (ref 19–32)
Chloride: 102 mmol/L (ref 96–112)
Creatinine, Ser: 0.81 mg/dL (ref 0.50–1.10)
GFR calc Af Amer: 79 mL/min — ABNORMAL LOW (ref >90)
GFR, EST NON AFRICAN AMERICAN: 68 mL/min — AB (ref >90)
GLUCOSE: 112 mg/dL — AB (ref 70–99)
Potassium: 4 mmol/L (ref 3.5–5.1)
Sodium: 137 mmol/L (ref 135–145)

## 2014-06-02 LAB — CBC
HEMATOCRIT: 30.6 % — AB (ref 36.0–46.0)
HEMOGLOBIN: 9.7 g/dL — AB (ref 12.0–15.0)
MCH: 29.9 pg (ref 26.0–34.0)
MCHC: 31.7 g/dL (ref 30.0–36.0)
MCV: 94.4 fL (ref 78.0–100.0)
Platelets: 207 10*3/uL (ref 150–400)
RBC: 3.24 MIL/uL — ABNORMAL LOW (ref 3.87–5.11)
RDW: 13.9 % (ref 11.5–15.5)
WBC: 9.7 10*3/uL (ref 4.0–10.5)

## 2014-06-02 MED ORDER — IPRATROPIUM-ALBUTEROL 0.5-2.5 (3) MG/3ML IN SOLN
3.0000 mL | Freq: Two times a day (BID) | RESPIRATORY_TRACT | Status: DC
  Administered 2014-06-03: 3 mL via RESPIRATORY_TRACT
  Filled 2014-06-02: qty 3

## 2014-06-02 MED ORDER — OXYCODONE-ACETAMINOPHEN 5-325 MG PO TABS
1.0000 | ORAL_TABLET | ORAL | Status: DC | PRN
  Administered 2014-06-02 – 2014-06-03 (×3): 2 via ORAL
  Filled 2014-06-02 (×4): qty 2

## 2014-06-02 MED ORDER — ASPIRIN EC 81 MG PO TBEC
81.0000 mg | DELAYED_RELEASE_TABLET | Freq: Every day | ORAL | Status: DC
  Administered 2014-06-02 – 2014-06-03 (×2): 81 mg via ORAL
  Filled 2014-06-02 (×2): qty 1

## 2014-06-02 NOTE — Clinical Social Work Psychosocial (Signed)
Clinical Social Work Department BRIEF PSYCHOSOCIAL ASSESSMENT 06/02/2014  Patient:  Amanda Palmer, Amanda Palmer     Account Number:  [id number]     Admit date:  05/30/2014  Clinical Social Worker:  Delrae Sawyers  Date/Time:  06/02/2014 03:46 PM  Referred by:  Physician  Date Referred:  06/02/2014 Referred for  SNF Placement   Other Referral:   none.   Interview type:  Patient Other interview type:   none.    PSYCHOSOCIAL DATA Living Status:  ALONE Admitted from facility:   Level of care:   Primary support name:  Reva Teeters Primary support relationship to patient:  [place] Degree of support available:   Adequate support system.    CURRENT CONCERNS Current Concerns  Post-Acute Placement   Other Concerns:   none.    SOCIAL WORK ASSESSMENT / PLAN CSW received referral for possible SNF placement at time of discharge. CSW met with patient at bedside to discuss discharge disposition. Patient informed CSW that patient is aware of PT recommendation for SNF placement. Patient stated patient would prefer to discharge home with the assistance for patient's sister and niece, but patient's family is unable to provide 24/7 care.    Patient stated patient has no anxiety regarding SNF placement. Patient reported preference for [facility] or [facility]. CSW to follow and make appropriate referrals.   Assessment/plan status:  Psychosocial Support/Ongoing Assessment of Needs Other assessment/ plan:   none.   Information/referral to community resources:   [place] [facility].    PATIENT'S/FAMILY'S RESPONSE TO PLAN OF CARE: Patient understanding and agreeable to CSW plan of care. Patient expressed no further questions or concerns at this time.       Lubertha Sayres, [place] Clinical Social Work:  Orthopedics [phone]) and Surgical [phone]) Cell: [phone]        Fax: [phone]

## 2014-06-02 NOTE — Progress Notes (Signed)
Subjective: Doing ok. C/o some pain.     Objective: Vital signs in last 24 hours: Temp:  [98.1 F (36.7 C)-99.8 F (37.7 C)] 99.8 F (37.7 C) (03/24 0517) Pulse Rate:  [73-112] 73 (03/24 0517) Resp:  [14-22] 18 (03/24 0517) BP: (100-189)/(42-69) 133/42 mmHg (03/24 0643) SpO2:  [96 %-100 %] 97 % (03/24 0517)  Intake/Output from previous day: 03/23 0701 - 03/24 0700 In: 200 [I.V.:200] Out: -  Intake/Output this shift:     Recent Labs  05/30/14 2350 05/31/14 0557 06/02/14 0554  HGB 12.6 11.3* 9.7*    Recent Labs  05/31/14 0557 06/02/14 0554  WBC 11.6* 9.7  RBC 3.83* 3.24*  HCT 35.9* 30.6*  PLT 262 207    Recent Labs  05/31/14 0557 06/02/14 0554  NA 137 137  K 4.0 4.0  CL 100 102  CO2 28 28  BUN 27* 12  CREATININE 1.21* 0.81  GLUCOSE 130* 112*  CALCIUM 8.8 8.8   No results for input(s): LABPT, INR in the last 72 hours.  Exam:  Splint intact.  Moves toes well.  Good capillary refill.  Sensation intact.   Assessment/Plan: PT today.  Possible d/c home in a couple days if medically stable and moves well with therapy.  Question short SNF.  D/c dilaudid.  Aspirin 81mg  daily for dvt prophylaxis.    Amybeth Sieg M 06/02/2014, 8:11 AM

## 2014-06-02 NOTE — Clinical Social Work Placement (Addendum)
Clinical Social Work Department CLINICAL SOCIAL WORK PLACEMENT NOTE 06/02/2014  Patient:  Amanda Palmer, Amanda Palmer  Account Number:  [id number] Admit date:  05/30/2014  Clinical Social Worker:  Delrae Sawyers  Date/time:  06/02/2014 03:52 PM  Clinical Social Work is seeking post-discharge placement for this patient at the following level of care:   [place]   (*CSW will update this form in Epic as items are completed)   06/02/2014  Patient/family provided with [place] Department of Clinical Social Work's list of facilities offering this level of care within the geographic area requested by the patient (or if unable, by the patient's family).  06/02/2014  Patient/family informed of their freedom to choose among providers that offer the needed level of care, that participate in Medicare, Medicaid or managed care program needed by the patient, have an available bed and are willing to accept the patient.  06/02/2014  Patient/family informed of [facility]' ownership interest in [facility], as well as of the fact that they are under no obligation to receive care at this facility.  PASARR submitted to EDS on 06/02/2014 PASARR number received on 06/02/2014  FL2 transmitted to all facilities in geographic area requested by pt/family on  06/02/2014 FL2 transmitted to all facilities within larger geographic area on   Patient informed that his/her managed care company has contracts with or will negotiate with  certain facilities, including the following:     Patient/family informed of bed offers received:  06/03/2014 Patient chooses bed at [facility] Physician recommends and patient chooses bed at    Patient to be transferred to  [facility] on  06/03/2014 Patient to be transferred to facility by [facility] Patient and family notified of transfer on 06/03/2014 Name of family member notified:  Patient and patient's niece at bedside.  The following physician  request were entered in Epic:   Additional Comments:  Lubertha Sayres, [place] Work:  Orthopedics [phone]) and Surgical [phone]) Cell: [phone]      Fax: [phone]

## 2014-06-02 NOTE — Evaluation (Signed)
Occupational Therapy Evaluation Patient Details Name: Amanda Palmer MRN: [id number] DOB: Sep 23, 1933 Today's Date: 06/02/2014    History of Present Illness 81 yo female adm 05/30/14 with syncope, fall resulting in L  ankle injury, she is now s/p L ankle ORIF trimalleolar  fx;  PMHx:COPD, HTN, bil TKA, R THA, L RCR;   Clinical Impression   Patient independent PTA. Patient currently requires up to max assist +2 for LB ADLs, sit<>stands, and functional transfers. Patient with increased fatigue and overall decreased endurance. Patient on 2 liters of 02 via nasal cannula. Patient will benefit from acute OT to increase overall independence in the areas of ADLs, functional mobility, endurance training, and overall safety in order to safely discharge to venue listed below.     Follow Up Recommendations  SNF;Supervision/Assistance - 24 hour    Equipment Recommendations  3 in 1 bedside comode;Other (comment) (AE - reacher, sock aid, LH sponge, LH shoe horn)    Recommendations for Other Services  None at this time   Precautions / Restrictions Precautions Precautions: Fall Restrictions Weight Bearing Restrictions: Yes LLE Weight Bearing: Non weight bearing      Mobility Bed Mobility Overal bed mobility: Needs Assistance Bed Mobility: Supine to Sit     Supine to sit: Min assist     General bed mobility comments: Patient found seated in recliner upon OT entering room.  Transfers Overall transfer level: Needs assistance Equipment used: Rolling walker (2 wheeled) Transfers: Sit to/from Stand Sit to Stand: +2 safety/equipment;+2 physical assistance;Max assist         General transfer comment: Secondary to increased fatigue/decreased endurance, patient max +2 for sit<>stands and stand pivot transfers.      Balance Overall balance assessment: Needs assistance Sitting-balance support: Single extremity supported;No upper extremity supported Sitting balance-Leahy Scale: Fair      Standing balance support: Bilateral upper extremity supported;During functional activity Standing balance-Leahy Scale: Zero    ADL Overall ADL's : Needs assistance/impaired Eating/Feeding: Set up;Sitting   Grooming: Set up;Sitting   Upper Body Bathing: Min guard;Sitting   Lower Body Bathing: Maximal assistance;Sit to/from stand;Cueing for safety   Upper Body Dressing : Min guard;Sitting   Lower Body Dressing: Sit to/from stand;Cueing for safety;Maximal assistance   Toilet Transfer: Maximal assistance;+2 for physical assistance;+2 for safety/equipment;BSC;RW   Toileting- Clothing Manipulation and Hygiene: Total assistance;Sit to/from stand;Cueing for safety;+2 for safety/equipment;+2 for physical assistance       Functional mobility during ADLs: +2 for physical assistance;Maximal assistance;Rolling walker General ADL Comments: Patient found transferring off BSC, back to recliner with assist from nursing staff. Patient with increased fatigue/decreased endurance during transfer. Discussed with RN and patient that gait belt would be helpful and provide a more safe transfer, need for patient to properly and safely use RW during sit<>stands, importance of RLE touching surface patient is going to sit on. Patient able to reach RLE for LB ADLs, educated patient on crossing leg over to prevent from bending over and constricting lungs; patient tended to hold breath and turn red, Patient's 02 sats=98% on 2 liters after activity. Attempted 2nd and 3rd sit<>stand, however patient unable secondary to increased fatigue/decreased endurance. Educated patient on energy conservation techniques of pursed lip breathing and seated rest breaks prn.     Pertinent Vitals/Pain Pain Assessment: 0-10 Pain Score: 5  Pain Location: left ankle Pain Descriptors / Indicators: Aching Pain Intervention(s): Limited activity within patient's tolerance;Monitored during session;Repositioned     Hand Dominance Right    Extremity/Trunk Assessment Upper Extremity  Assessment Upper Extremity Assessment: Generalized weakness RUE Deficits / Details: grossly 3+/5 bil UEs   Lower Extremity Assessment Lower Extremity Assessment: Defer to PT evaluation LLE Deficits / Details: L knee AAROM WFL, hip flexion and knee extension grossly 2+/5 LLE: Unable to fully assess due to immobilization   Cervical / Trunk Assessment Cervical / Trunk Assessment: Normal   Communication Communication Communication: No difficulties   Cognition Arousal/Alertness: Awake/alert Behavior During Therapy: WFL for tasks assessed/performed Overall Cognitive Status: Impaired/Different from baseline Area of Impairment: Memory     Memory: Decreased recall of precautions;Decreased short-term memory         General Comments: Patient repeated self at times during OT eval. RN notified and stated could be related to medication?               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    OT Diagnosis: Generalized weakness;Acute pain   OT Problem List: Decreased strength;Decreased activity tolerance;Impaired balance (sitting and/or standing);Decreased safety awareness;Decreased knowledge of use of DME or AE;Decreased knowledge of precautions;Pain   OT Treatment/Interventions: Self-care/ADL training;Therapeutic exercise;DME and/or AE instruction;Energy  conservation;Therapeutic activities;Patient/family education;Balance training    OT Goals(Current goals can be found in the care plan section) Acute Rehab OT Goals Patient Stated Goal: go to rehab to get stronger OT Goal Formulation: With patient/family Time For Goal Achievement: 06/09/14 Potential to Achieve Goals: Good ADL Goals Pt Will Perform Grooming: standing;with mod assist Pt Will Perform Lower Body Bathing: with min assist;sit to/from stand;with adaptive equipment Pt Will Perform Lower Body Dressing: with min assist;with adaptive equipment;sit to/from stand Pt Will Transfer to Toilet: with mod assist;bedside commode Additional ADL Goal #1: Patient will be able to identify at least 3 energy conservation techniques during ADL and IADL tasks  OT Frequency: Min 2X/week   Barriers to D/C: Decreased caregiver support          End of Session Equipment Utilized During Treatment: Gait belt;Rolling walker Nurse Communication: Mobility status;Need for lift equipment (recommend STEDY lift for when patient fatigued)  Activity Tolerance: Patient limited by fatigue Patient left: in chair;with call bell/phone within reach;with family/visitor present   Time: 8338-2505 OT Time Calculation (min): 34 min Charges:  OT General Charges $OT Visit: 1 Procedure OT Evaluation $Initial OT Evaluation Tier I: 1 Procedure OT Treatments $Self Care/Home Management : 8-22 mins  Lourine Alberico , MS, OTR/L, CLT Pager: [phone]  06/02/2014, 2:32 PM

## 2014-06-02 NOTE — Evaluation (Signed)
Physical Therapy Evaluation Patient Details Name: Amanda Palmer MRN: [id number] DOB: [birth year] Today's Date: 06/02/2014   History of Present Illness  81 yo female adm 05/30/14 with syncope, fall resulting in L  ankle injury, she is now s/p L ankle ORIF trimalleolar  fx;  PMHx:COPD, HTN, bil TKA, R THA, L RCR;  Clinical Impression  Pt admitted with above diagnosis. Pt currently with functional limitations due to the deficits listed below (see PT Problem List).  Pt will benefit from skilled PT to increase their independence and safety with mobility to allow discharge to the venue listed below.   Pt will benefit from STSNF; she is motivated to work with therapy; Pt and family reside in [place] and verbalize that they are hopeful  for the [facility] or a facility closer to home; will follow     Follow Up Recommendations SNF    Equipment Recommendations  None recommended by PT    Recommendations for Other Services       Precautions / Restrictions Precautions Precautions: Fall Restrictions LLE Weight Bearing: Non weight bearing      Mobility  Bed Mobility Overal bed mobility: Needs Assistance Bed Mobility: Supine to Sit     Supine to sit: Min assist     General bed mobility comments: incr time, verbal cues for use of UEs, assist with LLE off bed   Transfers Overall transfer level: Needs assistance Equipment used: Rolling walker (2 wheeled) Transfers: Sit to/from Stand Sit to Stand: Min assist         General transfer comment: assist for balance during transition to RW; pt encouraged to take extra time today d/t recent syncopal episode  Ambulation/Gait Ambulation/Gait assistance: Min assist Ambulation Distance (Feet): 5 Feet Assistive device: Rolling walker (2 wheeled)       General Gait Details: min assist for balance with pivotal steps to chair; verbal cues for technique, NWB; pt maintains NWB well but fatigues rapidly during stand-step transfer  Stairs             Wheelchair Mobility    Modified Rankin (Stroke Patients Only)       Balance Overall balance assessment: Needs assistance   Sitting balance-Leahy Scale: Good       Standing balance-Leahy Scale: Poor                               Pertinent Vitals/Pain Pain Assessment: 0-10 Pain Location: L ankle    Home Living Family/patient expects to be discharged to:: Skilled nursing facility Living Arrangements: Alone                    Prior Function Level of Independence: Independent         Comments: helped take care of her sister at times, 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     Hand Dominance        Extremity/Trunk Assessment   Upper Extremity Assessment: RUE deficits/detail;Defer to OT evaluation RUE Deficits / Details: grossly 3+/5 bil UEs         Lower Extremity Assessment: LLE deficits/detail   LLE Deficits / Details: L knee AAROM WFL, hip flexion and knee extension grossly 2+/5     Communication   Communication: No difficulties  Cognition Arousal/Alertness: Awake/alert Behavior During Therapy: WFL for tasks assessed/performed Overall Cognitive Status: Within Functional Limits for tasks assessed  General Comments      Exercises        Assessment/Plan    PT Assessment Patient needs continued PT services  PT Diagnosis Difficulty walking   PT Problem List Decreased strength;Decreased activity tolerance;Decreased balance;Decreased mobility;Decreased knowledge of use of DME  PT Treatment Interventions DME instruction;Gait training;Functional mobility training;Therapeutic activities;Patient/family education;Therapeutic exercise   PT Goals (Current goals can be found in the Care Plan section) Acute Rehab PT Goals Patient Stated Goal: to go to rehab and be I again PT Goal Formulation: With patient Time For Goal Achievement: 06/09/14 Potential to Achieve Goals: Good    Frequency Min  3X/week   Barriers to discharge        Co-evaluation               End of Session Equipment Utilized During Treatment: Gait belt;Oxygen Activity Tolerance: Patient tolerated treatment well;Patient limited by fatigue Patient left: in chair;with call bell/phone within reach;with family/visitor present Nurse Communication: Mobility status         Time: 6979-4801 PT Time Calculation (min) (ACUTE ONLY): 27 min   Charges:   PT Evaluation $Initial PT Evaluation Tier I: 1 Procedure PT Treatments $Gait Training: 8-22 mins   PT G Codes:        Shelbey Palmer 06/21/14, 12:26 PM

## 2014-06-02 NOTE — Progress Notes (Addendum)
PATIENT DETAILS Name: Amanda Palmer Age: 81 y.o. Sex: female Date of Birth: [birth year] Admit Date: 05/30/2014 Admitting Physician Rise Patience, MD QMG:[id number], [facility], MD  Subjective: Awake and alert. No major complaints-except some pain at the operative site. Her pastor was at bedside-spoke after patient gave me permission  Assessment/Plan: Principal Problem:   Syncope and collapse:orthostatics positive on 3/22-likely etiology. Continue to hold Losartan/HCTZ. Echo shows preserved EF, troponin negative. Tele neg.  Active Problems:    Left Trimalleolar fracture of ankle, closed:Ortho consulted, underwent ORIF on 3/23. Per orthopedics, use aspirin for DVT prophylaxis. Patient agreeable to SNF on discharge    PPJ:KDTOIZT pre-renal azotemia in a setting of diuretic use. Stopped Losartan/HCTZ. Resolved with hydration, saline lock all IV fluids    Anemia: Suspect secondary to perioperative blood loss and dilution from IV fluids. No indication of blood transfusion. Monitor CBC periodically    COPD (chronic obstructive pulmonary disease):lungs clear. Continue nebs, add incentive spirometry.    Chronic Resp Failure:on home O2 2l/m-Continue    HTN (hypertension): BP currently controlled without any anti-hypertensives. Follow     Scalp abrasion - closely observe.    GERD:Stable, continue PPI     Lung nodule seen in CT scan - patient will need close follow-up once discharged  Disposition: Remain inpatient-suspect SNF on discharge  Antibiotics:  None   Anti-infectives    None      DVT Prophylaxis:  SCD's  Code Status: Full code   Family Communication None at bedside  Procedures:  ORIF 3/23  CONSULTS:  orthopedic surgery   MEDICATIONS: Scheduled Meds: . arformoterol  15 mcg Nebulization BID  . aspirin EC  81 mg Oral Daily  . atorvastatin  20 mg Oral QPM  . budesonide  0.25 mg Nebulization BID  . clonazePAM  1 mg Oral QHS  . gabapentin  300 mg Oral  QHS  . ipratropium-albuterol  3 mL Nebulization TID  . montelukast  10 mg Oral Daily  . pantoprazole  40 mg Oral Daily  . sodium chloride  3 mL Intravenous Q12H  . venlafaxine  75 mg Oral TID WC   Continuous Infusions: . lactated ringers 50 mL/hr at 06/01/14 1428   PRN Meds:.acetaminophen **OR** acetaminophen, albuterol, hydrALAZINE, HYDROcodone-acetaminophen, nitroGLYCERIN, ondansetron **OR** ondansetron (ZOFRAN) IV, sodium chloride    PHYSICAL EXAM: Vital signs in last 24 hours: Filed Vitals:   06/02/14 0100 06/02/14 0517 06/02/14 0643 06/02/14 0917  BP: 126/56 189/57 133/42   Pulse: 83 73    Temp: 98.2 F (36.8 C) 99.8 F (37.7 C)    TempSrc:      Resp: 18 18    Height:      Weight:      SpO2: 96% 97%  98%    Weight change:  Filed Weights   05/30/14 2332  Weight: 65.318 kg (144 lb)   Body mass index is 24.71 kg/(m^2).   Gen Exam: Awake and alert with clear speech.   Neck: Supple, No JVD.   Chest: B/L Clear.  No rhonchi CVS: S1 S2 Regular, no murmurs.  Abdomen: soft, BS +, non tender, non distended.  Neurologic: Non Focal.   Skin: No Rash.   Wounds: N/A.   Intake/Output from previous day:  Intake/Output Summary (Last 24 hours) at 06/02/14 1049 Last data filed at 06/01/14 1539  Gross per 24 hour  Intake    200 ml  Output      0 ml  Net  200 ml     LAB RESULTS: CBC  Recent Labs Lab 05/30/14 2350 05/31/14 0557 06/02/14 0554  WBC 12.2* 11.6* 9.7  HGB 12.6 11.3* 9.7*  HCT 38.9 35.9* 30.6*  PLT 288 262 207  MCV 93.1 93.7 94.4  MCH 30.1 29.5 29.9  MCHC 32.4 31.5 31.7  RDW 13.9 14.0 13.9  LYMPHSABS 1.9 2.1  --   MONOABS 0.7 0.7  --   EOSABS 0.2 0.2  --   BASOSABS 0.0 0.0  --     Chemistries   Recent Labs Lab 05/30/14 2350 05/31/14 0557 06/02/14 0554  NA 136 137 137  K 3.8 4.0 4.0  CL 93* 100 102  CO2 34* 28 28  GLUCOSE 156* 130* 112*  BUN 27* 27* 12  CREATININE 1.51* 1.21* 0.81  CALCIUM 9.5 8.8 8.8    CBG: No results for  input(s): GLUCAP in the last 168 hours.  GFR Estimated Creatinine Clearance: 49.4 mL/min (by C-G formula based on Cr of 0.81).  Coagulation profile No results for input(s): INR, PROTIME in the last 168 hours.  Cardiac Enzymes  Recent Labs Lab 05/31/14 0557  TROPONINI <0.03    Invalid input(s): POCBNP No results for input(s): DDIMER in the last 72 hours. No results for input(s): HGBA1C in the last 72 hours. No results for input(s): CHOL, HDL, LDLCALC, TRIG, CHOLHDL, LDLDIRECT in the last 72 hours.  Recent Labs  05/31/14 1453  TSH 2.522   No results for input(s): VITAMINB12, FOLATE, FERRITIN, TIBC, IRON, RETICCTPCT in the last 72 hours. No results for input(s): LIPASE, AMYLASE in the last 72 hours.  Urine Studies No results for input(s): UHGB, CRYS in the last 72 hours.  Invalid input(s): UACOL, UAPR, USPG, UPH, UTP, UGL, UKET, UBIL, UNIT, UROB, ULEU, UEPI, UWBC, URBC, UBAC, CAST, UCOM, BILUA  MICROBIOLOGY: Recent Results (from the past 240 hour(s))  Surgical pcr screen     Status: None   Collection Time: 06/01/14  1:17 AM  Result Value Ref Range Status   MRSA, PCR NEGATIVE NEGATIVE Final   Staphylococcus aureus NEGATIVE NEGATIVE Final    Comment:        The Xpert SA Assay (FDA approved for NASAL specimens in patients over 102 years of age), is one component of a comprehensive surveillance program.  Test performance has been validated by [facility] for patients greater than or equal to 23 year old. It is not intended to diagnose infection nor to guide or monitor treatment.     RADIOLOGY STUDIES/RESULTS: Dg Chest 2 View  05/22/2014   CLINICAL DATA:  Acute onset of shortness of breath and congestion, with persistent cough. Initial encounter.  EXAM: CHEST  2 VIEW  COMPARISON:  None.  FINDINGS: The lungs are well-aerated. The lungs are hyperexpanded, with flattening of the hemidiaphragms, likely reflecting COPD. There is no evidence of focal opacification, pleural  effusion or pneumothorax.  The heart is normal in size; the mediastinal contour is within normal limits. No acute osseous abnormalities are seen.  IMPRESSION: Findings of COPD.  No acute cardiopulmonary process seen.   Electronically Signed   By: Garald Balding M.D.   On: 05/22/2014 01:27   Dg Knee 2 Views Right  05/31/2014   CLINICAL DATA:  [place]  EXAM: RIGHT KNEE - 1-2 VIEW  COMPARISON:  None.  FINDINGS: There are intact appearances of the total knee arthroplasty. There is no fracture or dislocation. There is no soft tissue foreign body.  IMPRESSION: Negative.   Electronically Signed  By: Andreas Newport M.D.   On: 05/31/2014 01:58   Dg Ankle Complete Left  06/01/2014   CLINICAL DATA:  Open reduction internal fixation for fracture  EXAM: LEFT ANKLE COMPLETE - 3+ VIEW; DG C-ARM 61-120 MIN  FLUOROSCOPY TIME:  0 minutes 8 seconds; three views  COMPARISON:  Preoperative study May 31, 2014  FINDINGS: Frontal, oblique, and lateral views were obtained. There are 2 screws transfixing a fracture of the medial malleolus. There is screw and plate fixation in the distal fibular region. Alignment in these areas is essentially anatomic. The ankle mortise appears intact.  IMPRESSION: Postoperative change in the medial malleolus and distal fibula with alignment essentially anatomic in these areas. Ankle mortise appears intact.   Electronically Signed   By: Lowella Grip III M.D.   On: 06/01/2014 17:15   Dg Ankle Complete Left  05/31/2014   CLINICAL DATA:  [place]  EXAM: LEFT ANKLE COMPLETE - 3+ VIEW  COMPARISON:  None.  FINDINGS: Three views of the left ankle obtained through fiberglass demonstrate an oblique distal fibular fracture with a little more than 1 cortex width posterolateral displacement. There is a transverse medial malleolar fracture with moderate distraction. There is a vertical posterior malleolar fracture with 1 cortex width displacement. There is disruption of the mortise with a symmetric medial  widening and mild valgus.  IMPRESSION: Trimalleolar fracture.  Mild valgus.   Electronically Signed   By: Andreas Newport M.D.   On: 05/31/2014 02:00   Ct Head Wo Contrast  05/31/2014   CLINICAL DATA:  Initial evaluation for acute trauma, fall.  EXAM: CT HEAD WITHOUT CONTRAST  CT CERVICAL SPINE WITHOUT CONTRAST  TECHNIQUE: Multidetector CT imaging of the head and cervical spine was performed following the standard protocol without intravenous contrast. Multiplanar CT image reconstructions of the cervical spine were also generated.  COMPARISON:  None.  FINDINGS: CT HEAD FINDINGS  Diffuse prominence of the CSF containing spaces compatible with generalized cerebral atrophy. Patchy and confluent hypodensity within the periventricular and deep white matter both cerebral hemispheres most consistent with chronic small vessel ischemic changes.  No acute intracranial hemorrhage or large vessel territory infarct. No extra-axial fluid collection. No mass lesion or midline shift. No hydrocephalus.  Scalp soft tissues within normal limits.  Calvarium intact.  No acute abnormality seen about the orbits.  Scattered mucoperiosteal thickening present on about the ethmoidal air cells as well as the right maxillary and sphenoid sinuses. No mastoid effusion.  CT CERVICAL SPINE FINDINGS  There is trace anterior listhesis of C4 on C5, C5 on C6, and C6 on C7 Facet arthrosis at these levels suggest that this is chronic in nature. No fracture or acute listhesis. Normal C1-2 articulations intact. Vertebral body heights preserved.  Prevertebral soft tissues normal.  Mild multilevel degenerative disc disease is evidenced by intervertebral disc space narrowing and endplate osteophytosis is present, most prevalent at C4-5.  No acute soft tissue abnormality within the neck. Partially visualized lungs are clear. 5 mm left upper lobe nodule noted.  IMPRESSION: CT BRAIN:  1. No acute intracranial process. 2. Atrophy with chronic microvascular  ischemic disease.  CT CERVICAL SPINE:  1. No acute traumatic injury within the cervical spine. 2. Chronic degenerative anterolisthesis of C4 on C5, C5 on C6 common C6 on C7. 3. 5 mm left upper lobe nodule, indeterminate. If the patient is at high risk for bronchogenic carcinoma, follow-up chest CT at 6-12 months is recommended. If the patient is at low risk for bronchogenic  carcinoma, follow-up chest CT at 12 months is recommended. This recommendation follows the consensus statement: Guidelines for Management of Small Pulmonary Nodules Detected on CT Scans: A Statement from the [place] as published in Radiology 2005;237:395-400.   Electronically Signed   By: Jeannine Boga M.D.   On: 05/31/2014 02:21   Ct Cervical Spine Wo Contrast  05/31/2014   CLINICAL DATA:  Initial evaluation for acute trauma, fall.  EXAM: CT HEAD WITHOUT CONTRAST  CT CERVICAL SPINE WITHOUT CONTRAST  TECHNIQUE: Multidetector CT imaging of the head and cervical spine was performed following the standard protocol without intravenous contrast. Multiplanar CT image reconstructions of the cervical spine were also generated.  COMPARISON:  None.  FINDINGS: CT HEAD FINDINGS  Diffuse prominence of the CSF containing spaces compatible with generalized cerebral atrophy. Patchy and confluent hypodensity within the periventricular and deep white matter both cerebral hemispheres most consistent with chronic small vessel ischemic changes.  No acute intracranial hemorrhage or large vessel territory infarct. No extra-axial fluid collection. No mass lesion or midline shift. No hydrocephalus.  Scalp soft tissues within normal limits.  Calvarium intact.  No acute abnormality seen about the orbits.  Scattered mucoperiosteal thickening present on about the ethmoidal air cells as well as the right maxillary and sphenoid sinuses. No mastoid effusion.  CT CERVICAL SPINE FINDINGS  There is trace anterior listhesis of C4 on C5, C5 on C6, and C6 on C7  Facet arthrosis at these levels suggest that this is chronic in nature. No fracture or acute listhesis. Normal C1-2 articulations intact. Vertebral body heights preserved.  Prevertebral soft tissues normal.  Mild multilevel degenerative disc disease is evidenced by intervertebral disc space narrowing and endplate osteophytosis is present, most prevalent at C4-5.  No acute soft tissue abnormality within the neck. Partially visualized lungs are clear. 5 mm left upper lobe nodule noted.  IMPRESSION: CT BRAIN:  1. No acute intracranial process. 2. Atrophy with chronic microvascular ischemic disease.  CT CERVICAL SPINE:  1. No acute traumatic injury within the cervical spine. 2. Chronic degenerative anterolisthesis of C4 on C5, C5 on C6 common C6 on C7. 3. 5 mm left upper lobe nodule, indeterminate. If the patient is at high risk for bronchogenic carcinoma, follow-up chest CT at 6-12 months is recommended. If the patient is at low risk for bronchogenic carcinoma, follow-up chest CT at 12 months is recommended. This recommendation follows the consensus statement: Guidelines for Management of Small Pulmonary Nodules Detected on CT Scans: A Statement from the [place] as published in Radiology 2005;237:395-400.   Electronically Signed   By: Jeannine Boga M.D.   On: 05/31/2014 02:21   Dg Chest Port 1 View  06/01/2014   CLINICAL DATA:  Cough and history of COPD.  EXAM: PORTABLE CHEST - 1 VIEW  COMPARISON:  05/30/2014  FINDINGS: The heart size and mediastinal contours are within normal limits. Stable mild chronic lung disease and minimal bibasilar scarring. No significant hyperinflation. There is no evidence of pulmonary edema, consolidation, pneumothorax, nodule or pleural fluid. The visualized skeletal structures are unremarkable.  IMPRESSION: No active disease.   Electronically Signed   By: Aletta Edouard M.D.   On: 06/01/2014 08:49   Dg Chest Portable 1 View  05/31/2014   CLINICAL DATA:  Status post  fall. Shortness of breath and cough. Initial encounter.  EXAM: PORTABLE CHEST - 1 VIEW  COMPARISON:  Chest radiograph performed 05/22/2014  FINDINGS: The lungs are well-aerated. Minimal bibasilar atelectasis is noted. There is no  evidence of pleural effusion or pneumothorax.  The cardiomediastinal silhouette is within normal limits. No acute osseous abnormalities are seen. Subcortical cystic changes are seen at both humeral heads, with superior subluxation of the left humeral head, reflecting underlying chronic rotator cuff tear.  IMPRESSION: Minimal bibasilar atelectasis noted; lungs otherwise clear. No displaced rib fracture seen.   Electronically Signed   By: Garald Balding M.D.   On: 05/31/2014 00:32   Dg C-arm 1-60 Min  06/01/2014   CLINICAL DATA:  Open reduction internal fixation for fracture  EXAM: LEFT ANKLE COMPLETE - 3+ VIEW; DG C-ARM 61-120 MIN  FLUOROSCOPY TIME:  0 minutes 8 seconds; three views  COMPARISON:  Preoperative study May 31, 2014  FINDINGS: Frontal, oblique, and lateral views were obtained. There are 2 screws transfixing a fracture of the medial malleolus. There is screw and plate fixation in the distal fibular region. Alignment in these areas is essentially anatomic. The ankle mortise appears intact.  IMPRESSION: Postoperative change in the medial malleolus and distal fibula with alignment essentially anatomic in these areas. Ankle mortise appears intact.   Electronically Signed   By: Lowella Grip III M.D.   On: 06/01/2014 17:15    Oren Binet, MD  [facility] Pager:[phone]  If 7PM-7AM, please contact night-coverage [email or web address] Password [facility] 06/02/2014, 10:49 AM   LOS: 2 days

## 2014-06-02 NOTE — Op Note (Signed)
NAME:  Amanda Palmer, LANIGAN             ACCOUNT NO.:  [id number]  MEDICAL RECORD NO.:  [id number]  LOCATION:  [id number]                        FACILITY:  [place] [facility]  PHYSICIAN:  Deagan Sevin C. Lorin Mercy, M.D.    DATE OF BIRTH:  Jul 13, 1933  DATE OF PROCEDURE:  06/01/2014 DATE OF DISCHARGE:                              OPERATIVE REPORT   PREOPERATIVE DIAGNOSIS:  Left trimalleolar ankle fracture, closed.  POSTOPERATIVE DIAGNOSIS:  Left trimalleolar ankle fracture, closed.  PROCEDURE:  Open reduction and internal fixation with bimalleolar fixation of left trimalleolar fracture.  SURGEON:  Chrystian Ressler C. Lorin Mercy, MD  ASSISTANT:  Benjiman Core, PA medically necessary and present for the entire procedure.  ANESTHESIA:  General LMA plus Marcaine skin local.  TOURNIQUET TIME:  Less than 20 minutes.  INDICATIONS:  This 81 year old female fell with a fracture dislocation of the ankle with the posterior malleolus less than 1/4th of the joint. This was reduced.  The ER staff placed in a splint and she was brought to the operating room for stabilization of the unstable fracture.  PROCEDURE IN DETAIL:  After induction of anesthesia, preoperative Ancef prophylaxis, leg was prepped up to the knee with DuraPrep, usual extremity sheets drapes, split sheets.  Stockinette was applied, sterile skin marker.  Time-out procedure.  Medial incision was opened first with a C-shaped incision just anterior to the medial malleolus.  Saphenous vein preserved.  Fracture was distracted.  Hematoma evacuated. Posterior cortex, the medial malleolus had comminution and the posterior tibial tendon was not inside the fracture.  Joint was washed out. Attention was turned lateral.  Fracture was reduced.  This was a supination external rotation type injury with a short oblique fibular fracture from anterior proximal to distal posteriorly.  Fracture was washed out.  The hematoma reduced, held with self-retaining Lobster Claw clamp.  Biomet composite  plate was selected.  The tip was bent to correspond to the tip of the fibula.  Some counter curve above the 3rd and 4th screw to fit the anatomic position of the fibula and then initially 12 mm screw which was later switched from 10 mm locking screw. Nonlocking screws were used proximally sucking the plate down directly against the bone and then next remaining holes were filled with locking screws.  Inner frag 24 mm nonlocking screw was placed from anterior proximal to distal posteriorly lagging the oblique fracture compressing it.  There was anatomic position checked under fluoroscopy.  Attention was then turned medially where the fracture was reduced.  K-wire was placed.  Two 40 mm screws were placed,  tightened down securely, checked under fluoroscopy.  Final spot pictures were taken.  Wound was irrigated.  AP lateral mortise pictures were taken, tourniquet deflated.  Subcutaneous tissue reapproximated with 2-0 Vicryl, skin with staple closure, Marcaine infiltration.  Short-leg splint.  Instrument count was correct and patient was transferred to the recovery room.     Twyla Dais C. Lorin Mercy, M.D.     MCY/MEDQ  D:  06/01/2014  T:  06/02/2014  Job:  [id number]

## 2014-06-03 ENCOUNTER — Non-Acute Institutional Stay (SKILLED_NURSING_FACILITY): Payer: Medicare Other | Admitting: Internal Medicine

## 2014-06-03 ENCOUNTER — Inpatient Hospital Stay
Admission: RE | Admit: 2014-06-03 | Discharge: 2014-06-29 | Disposition: A | Discharge status: Home / Self Care | Payer: No Typology Code available for payment source | Source: Ambulatory Visit | Attending: Internal Medicine | Admitting: Internal Medicine

## 2014-06-03 DIAGNOSIS — S82852S Displaced trimalleolar fracture of left lower leg, sequela: Secondary | ICD-10-CM | POA: Diagnosis not present

## 2014-06-03 DIAGNOSIS — J42 Unspecified chronic bronchitis: Secondary | ICD-10-CM | POA: Diagnosis not present

## 2014-06-03 DIAGNOSIS — R911 Solitary pulmonary nodule: Secondary | ICD-10-CM | POA: Diagnosis not present

## 2014-06-03 DIAGNOSIS — N179 Acute kidney failure, unspecified: Secondary | ICD-10-CM | POA: Diagnosis not present

## 2014-06-03 DIAGNOSIS — I499 Cardiac arrhythmia, unspecified: Secondary | ICD-10-CM | POA: Diagnosis not present

## 2014-06-03 DIAGNOSIS — M6281 Muscle weakness (generalized): Secondary | ICD-10-CM | POA: Diagnosis not present

## 2014-06-03 DIAGNOSIS — I10 Essential (primary) hypertension: Secondary | ICD-10-CM

## 2014-06-03 DIAGNOSIS — N189 Chronic kidney disease, unspecified: Secondary | ICD-10-CM | POA: Diagnosis not present

## 2014-06-03 DIAGNOSIS — R262 Difficulty in walking, not elsewhere classified: Secondary | ICD-10-CM | POA: Diagnosis not present

## 2014-06-03 DIAGNOSIS — J449 Chronic obstructive pulmonary disease, unspecified: Secondary | ICD-10-CM | POA: Diagnosis not present

## 2014-06-03 DIAGNOSIS — R002 Palpitations: Secondary | ICD-10-CM | POA: Diagnosis not present

## 2014-06-03 DIAGNOSIS — S0101XD Laceration without foreign body of scalp, subsequent encounter: Secondary | ICD-10-CM | POA: Diagnosis not present

## 2014-06-03 DIAGNOSIS — R278 Other lack of coordination: Secondary | ICD-10-CM | POA: Diagnosis not present

## 2014-06-03 DIAGNOSIS — M21279 Flexion deformity, unspecified ankle and toes: Secondary | ICD-10-CM | POA: Diagnosis not present

## 2014-06-03 DIAGNOSIS — J441 Chronic obstructive pulmonary disease with (acute) exacerbation: Secondary | ICD-10-CM | POA: Diagnosis not present

## 2014-06-03 DIAGNOSIS — S82856D Nondisplaced trimalleolar fracture of unspecified lower leg, subsequent encounter for closed fracture with routine healing: Secondary | ICD-10-CM | POA: Diagnosis not present

## 2014-06-03 DIAGNOSIS — Z9181 History of falling: Secondary | ICD-10-CM | POA: Diagnosis not present

## 2014-06-03 DIAGNOSIS — S82852D Displaced trimalleolar fracture of left lower leg, subsequent encounter for closed fracture with routine healing: Secondary | ICD-10-CM | POA: Diagnosis not present

## 2014-06-03 DIAGNOSIS — R55 Syncope and collapse: Secondary | ICD-10-CM | POA: Diagnosis not present

## 2014-06-03 DIAGNOSIS — I951 Orthostatic hypotension: Secondary | ICD-10-CM | POA: Diagnosis not present

## 2014-06-03 LAB — CBC
HCT: 25.7 % — ABNORMAL LOW (ref 36.0–46.0)
Hemoglobin: 8.1 g/dL — ABNORMAL LOW (ref 12.0–15.0)
MCH: 29.6 pg (ref 26.0–34.0)
MCHC: 31.5 g/dL (ref 30.0–36.0)
MCV: 93.8 fL (ref 78.0–100.0)
PLATELETS: 165 10*3/uL (ref 150–400)
RBC: 2.74 MIL/uL — ABNORMAL LOW (ref 3.87–5.11)
RDW: 14.1 % (ref 11.5–15.5)
WBC: 5.7 10*3/uL (ref 4.0–10.5)

## 2014-06-03 LAB — BASIC METABOLIC PANEL
Anion gap: 4 — ABNORMAL LOW (ref 5–15)
BUN: 13 mg/dL (ref 6–23)
CHLORIDE: 102 mmol/L (ref 96–112)
CO2: 29 mmol/L (ref 19–32)
CREATININE: 0.76 mg/dL (ref 0.50–1.10)
Calcium: 8.5 mg/dL (ref 8.4–10.5)
GFR calc non Af Amer: 79 mL/min — ABNORMAL LOW (ref >90)
GLUCOSE: 100 mg/dL — AB (ref 70–99)
POTASSIUM: 3.9 mmol/L (ref 3.5–5.1)
Sodium: 135 mmol/L (ref 135–145)

## 2014-06-03 MED ORDER — CLONAZEPAM 1 MG PO TABS: 1.0000 mg | ORAL_TABLET | Freq: Every day | ORAL | Status: DC

## 2014-06-03 MED ORDER — HYDROCODONE-ACETAMINOPHEN 5-325 MG PO TABS: 1.0000 | ORAL_TABLET | Freq: Four times a day (QID) | ORAL | Status: DC | PRN

## 2014-06-03 NOTE — Discharge Planning (Signed)
Patient to be discharged to [facility]. Patient and patient's niece updated at bedside.  Facility: [facility] Report number: [phone] Transportation: EMS ([address])  Lubertha Sayres, [place] Cell: [phone]       Fax: [phone] Clinical Social Work: Orthopedics [phone]) and Surgical [phone])

## 2014-06-03 NOTE — Discharge Summary (Addendum)
PATIENT DETAILS Name: Amanda Palmer Age: 81 y.o. Sex: female Date of Birth: 09-May-1933 MRN: [id number]. Admitting Physician: Rise Patience, MD EHM:CNOB, [facility], MD  Admit Date: 05/30/2014 Discharge date: 06/03/2014  Recommendations for Outpatient Follow-up:  1. Check CBC/BMET in 1 week 2. Ensure follow-up with Orthopedics-Dr Lorin Mercy 3. Patient was found to have a 5 mm lung nodule on CT scan of cervical spine, please repeat a CT scan of the chest and ensure adequate follow-up then onwards  4. Orthostatic on presentation, have discontinued all antihypertensive medications. Please follow and reassess at next visit  PRIMARY DISCHARGE DIAGNOSIS:  Principal Problem:   Syncope and collapse Active Problems:   COPD (chronic obstructive pulmonary disease)   HTN (hypertension)   Trimalleolar fracture of ankle, closed   Scalp laceration   ARF (acute renal failure)   Syncope      PAST MEDICAL HISTORY: Past Medical History  Diagnosis Date  . Cancer     breast  . Hypertension   . COPD (chronic obstructive pulmonary disease)     DISCHARGE MEDICATIONS: Current Discharge Medication List    CONTINUE these medications which have CHANGED   Details  clonazePAM (KLONOPIN) 1 MG tablet Take 1 tablet (1 mg total) by mouth at bedtime. Qty: 15 tablet, Refills: 0    HYDROcodone-acetaminophen (NORCO/VICODIN) 5-325 MG per tablet Take 1 tablet by mouth every 6 (six) hours as needed for moderate pain. Qty: 20 tablet, Refills: 0      CONTINUE these medications which have NOT CHANGED   Details  albuterol (PROVENTIL HFA;VENTOLIN HFA) 108 (90 BASE) MCG/ACT inhaler Inhale 1-2 puffs into the lungs every 6 (six) hours as needed for wheezing or shortness of breath.    albuterol (PROVENTIL) (2.5 MG/3ML) 0.083% nebulizer solution Take 2.5 mg by nebulization every 6 (six) hours as needed for wheezing or shortness of breath.    arformoterol (BROVANA) 15 MCG/2ML NEBU Take 15 mcg by nebulization 2  (two) times daily.     aspirin 325 MG tablet Take 325 mg by mouth daily.    atorvastatin (LIPITOR) 20 MG tablet Take 20 mg by mouth every evening.     budesonide (PULMICORT) 0.25 MG/2ML nebulizer solution Take 0.25 mg by nebulization 2 (two) times daily.     Calcium Carb-Cholecalciferol (CALCIUM + D3) 600-200 MG-UNIT TABS Take 1 tablet by mouth 2 (two) times daily.     esomeprazole (NEXIUM) 40 MG capsule Take 40 mg by mouth daily at 12 noon.    gabapentin (NEURONTIN) 300 MG capsule Take 300 mg by mouth at bedtime.     montelukast (SINGULAIR) 10 MG tablet Take 10 mg by mouth daily.    Multiple Vitamin (MULTIVITAMIN WITH MINERALS) TABS tablet Take 1 tablet by mouth daily.    nitroGLYCERIN (NITROSTAT) 0.4 MG SL tablet Place 0.4 mg under the tongue every 5 (five) minutes as needed for chest pain.     venlafaxine (EFFEXOR) 75 MG tablet Take 75 mg by mouth 3 (three) times daily with meals.       STOP taking these medications     losartan-hydrochlorothiazide (HYZAAR) 100-25 MG per tablet      omeprazole (PRILOSEC) 20 MG capsule      traMADol (ULTRAM) 50 MG tablet      triamcinolone cream (KENALOG) 0.1 %      levofloxacin (LEVAQUIN) 500 MG tablet      predniSONE (DELTASONE) 10 MG tablet         ALLERGIES:   Allergies  Allergen Reactions  .  Morphine And Related Nausea And Vomiting  . Sulfa Antibiotics Nausea And Vomiting  . Penicillins Rash  . Prednisone Nausea And Vomiting, Anxiety and Other (See Comments)    Makes patient not in right state of mind    BRIEF HPI:  See H&P, Labs, Consult and Test reports for all details in brief, patient is a 81 y.o. female with history of COPD, hypertension, hyperlipidemia who was recently discharged after being treated for COPD exacerbation presented to the ER because patient had an episode of loss of consciousness. In the ED, upon further evaluation she was found to have a left ankle fracture. Patient was then admitted for further  evaluation and treatment  CONSULTATIONS:   orthopedic surgery  PERTINENT RADIOLOGIC STUDIES: Dg Chest 2 View  05/22/2014   CLINICAL DATA:  Acute onset of shortness of breath and congestion, with persistent cough. Initial encounter.  EXAM: CHEST  2 VIEW  COMPARISON:  None.  FINDINGS: The lungs are well-aerated. The lungs are hyperexpanded, with flattening of the hemidiaphragms, likely reflecting COPD. There is no evidence of focal opacification, pleural effusion or pneumothorax.  The heart is normal in size; the mediastinal contour is within normal limits. No acute osseous abnormalities are seen.  IMPRESSION: Findings of COPD.  No acute cardiopulmonary process seen.   Electronically Signed   By: Garald Balding M.D.   On: 05/22/2014 01:27   Dg Knee 2 Views Right  05/31/2014   CLINICAL DATA:  [place]  EXAM: RIGHT KNEE - 1-2 VIEW  COMPARISON:  None.  FINDINGS: There are intact appearances of the total knee arthroplasty. There is no fracture or dislocation. There is no soft tissue foreign body.  IMPRESSION: Negative.   Electronically Signed   By: Andreas Newport M.D.   On: 05/31/2014 01:58   Dg Ankle Complete Left  06/01/2014   CLINICAL DATA:  Open reduction internal fixation for fracture  EXAM: LEFT ANKLE COMPLETE - 3+ VIEW; DG C-ARM 61-120 MIN  FLUOROSCOPY TIME:  0 minutes 8 seconds; three views  COMPARISON:  Preoperative study May 31, 2014  FINDINGS: Frontal, oblique, and lateral views were obtained. There are 2 screws transfixing a fracture of the medial malleolus. There is screw and plate fixation in the distal fibular region. Alignment in these areas is essentially anatomic. The ankle mortise appears intact.  IMPRESSION: Postoperative change in the medial malleolus and distal fibula with alignment essentially anatomic in these areas. Ankle mortise appears intact.   Electronically Signed   By: Lowella Grip III M.D.   On: 06/01/2014 17:15   Dg Ankle Complete Left  05/31/2014   CLINICAL DATA:   [place]  EXAM: LEFT ANKLE COMPLETE - 3+ VIEW  COMPARISON:  None.  FINDINGS: Three views of the left ankle obtained through fiberglass demonstrate an oblique distal fibular fracture with a little more than 1 cortex width posterolateral displacement. There is a transverse medial malleolar fracture with moderate distraction. There is a vertical posterior malleolar fracture with 1 cortex width displacement. There is disruption of the mortise with a symmetric medial widening and mild valgus.  IMPRESSION: Trimalleolar fracture.  Mild valgus.   Electronically Signed   By: Andreas Newport M.D.   On: 05/31/2014 02:00   Ct Head Wo Contrast  05/31/2014   CLINICAL DATA:  Initial evaluation for acute trauma, fall.  EXAM: CT HEAD WITHOUT CONTRAST  CT CERVICAL SPINE WITHOUT CONTRAST  TECHNIQUE: Multidetector CT imaging of the head and cervical spine was performed following the standard protocol without intravenous  contrast. Multiplanar CT image reconstructions of the cervical spine were also generated.  COMPARISON:  None.  FINDINGS: CT HEAD FINDINGS  Diffuse prominence of the CSF containing spaces compatible with generalized cerebral atrophy. Patchy and confluent hypodensity within the periventricular and deep white matter both cerebral hemispheres most consistent with chronic small vessel ischemic changes.  No acute intracranial hemorrhage or large vessel territory infarct. No extra-axial fluid collection. No mass lesion or midline shift. No hydrocephalus.  Scalp soft tissues within normal limits.  Calvarium intact.  No acute abnormality seen about the orbits.  Scattered mucoperiosteal thickening present on about the ethmoidal air cells as well as the right maxillary and sphenoid sinuses. No mastoid effusion.  CT CERVICAL SPINE FINDINGS  There is trace anterior listhesis of C4 on C5, C5 on C6, and C6 on C7 Facet arthrosis at these levels suggest that this is chronic in nature. No fracture or acute listhesis. Normal C1-2  articulations intact. Vertebral body heights preserved.  Prevertebral soft tissues normal.  Mild multilevel degenerative disc disease is evidenced by intervertebral disc space narrowing and endplate osteophytosis is present, most prevalent at C4-5.  No acute soft tissue abnormality within the neck. Partially visualized lungs are clear. 5 mm left upper lobe nodule noted.  IMPRESSION: CT BRAIN:  1. No acute intracranial process. 2. Atrophy with chronic microvascular ischemic disease.  CT CERVICAL SPINE:  1. No acute traumatic injury within the cervical spine. 2. Chronic degenerative anterolisthesis of C4 on C5, C5 on C6 common C6 on C7. 3. 5 mm left upper lobe nodule, indeterminate. If the patient is at high risk for bronchogenic carcinoma, follow-up chest CT at 6-12 months is recommended. If the patient is at low risk for bronchogenic carcinoma, follow-up chest CT at 12 months is recommended. This recommendation follows the consensus statement: Guidelines for Management of Small Pulmonary Nodules Detected on CT Scans: A Statement from the [place] as published in Radiology 2005;237:395-400.   Electronically Signed   By: Jeannine Boga M.D.   On: 05/31/2014 02:21   Ct Cervical Spine Wo Contrast  05/31/2014   CLINICAL DATA:  Initial evaluation for acute trauma, fall.  EXAM: CT HEAD WITHOUT CONTRAST  CT CERVICAL SPINE WITHOUT CONTRAST  TECHNIQUE: Multidetector CT imaging of the head and cervical spine was performed following the standard protocol without intravenous contrast. Multiplanar CT image reconstructions of the cervical spine were also generated.  COMPARISON:  None.  FINDINGS: CT HEAD FINDINGS  Diffuse prominence of the CSF containing spaces compatible with generalized cerebral atrophy. Patchy and confluent hypodensity within the periventricular and deep white matter both cerebral hemispheres most consistent with chronic small vessel ischemic changes.  No acute intracranial hemorrhage or large  vessel territory infarct. No extra-axial fluid collection. No mass lesion or midline shift. No hydrocephalus.  Scalp soft tissues within normal limits.  Calvarium intact.  No acute abnormality seen about the orbits.  Scattered mucoperiosteal thickening present on about the ethmoidal air cells as well as the right maxillary and sphenoid sinuses. No mastoid effusion.  CT CERVICAL SPINE FINDINGS  There is trace anterior listhesis of C4 on C5, C5 on C6, and C6 on C7 Facet arthrosis at these levels suggest that this is chronic in nature. No fracture or acute listhesis. Normal C1-2 articulations intact. Vertebral body heights preserved.  Prevertebral soft tissues normal.  Mild multilevel degenerative disc disease is evidenced by intervertebral disc space narrowing and endplate osteophytosis is present, most prevalent at C4-5.  No acute soft tissue abnormality  within the neck. Partially visualized lungs are clear. 5 mm left upper lobe nodule noted.  IMPRESSION: CT BRAIN:  1. No acute intracranial process. 2. Atrophy with chronic microvascular ischemic disease.  CT CERVICAL SPINE:  1. No acute traumatic injury within the cervical spine. 2. Chronic degenerative anterolisthesis of C4 on C5, C5 on C6 common C6 on C7. 3. 5 mm left upper lobe nodule, indeterminate. If the patient is at high risk for bronchogenic carcinoma, follow-up chest CT at 6-12 months is recommended. If the patient is at low risk for bronchogenic carcinoma, follow-up chest CT at 12 months is recommended. This recommendation follows the consensus statement: Guidelines for Management of Small Pulmonary Nodules Detected on CT Scans: A Statement from the [place] as published in Radiology 2005;237:395-400.   Electronically Signed   By: Jeannine Boga M.D.   On: 05/31/2014 02:21   Dg Chest Port 1 View  06/01/2014   CLINICAL DATA:  Cough and history of COPD.  EXAM: PORTABLE CHEST - 1 VIEW  COMPARISON:  05/30/2014  FINDINGS: The heart size and  mediastinal contours are within normal limits. Stable mild chronic lung disease and minimal bibasilar scarring. No significant hyperinflation. There is no evidence of pulmonary edema, consolidation, pneumothorax, nodule or pleural fluid. The visualized skeletal structures are unremarkable.  IMPRESSION: No active disease.   Electronically Signed   By: Aletta Edouard M.D.   On: 06/01/2014 08:49   Dg Chest Portable 1 View  05/31/2014   CLINICAL DATA:  Status post fall. Shortness of breath and cough. Initial encounter.  EXAM: PORTABLE CHEST - 1 VIEW  COMPARISON:  Chest radiograph performed 05/22/2014  FINDINGS: The lungs are well-aerated. Minimal bibasilar atelectasis is noted. There is no evidence of pleural effusion or pneumothorax.  The cardiomediastinal silhouette is within normal limits. No acute osseous abnormalities are seen. Subcortical cystic changes are seen at both humeral heads, with superior subluxation of the left humeral head, reflecting underlying chronic rotator cuff tear.  IMPRESSION: Minimal bibasilar atelectasis noted; lungs otherwise clear. No displaced rib fracture seen.   Electronically Signed   By: Garald Balding M.D.   On: 05/31/2014 00:32   Dg C-arm 1-60 Min  06/01/2014   CLINICAL DATA:  Open reduction internal fixation for fracture  EXAM: LEFT ANKLE COMPLETE - 3+ VIEW; DG C-ARM 61-120 MIN  FLUOROSCOPY TIME:  0 minutes 8 seconds; three views  COMPARISON:  Preoperative study May 31, 2014  FINDINGS: Frontal, oblique, and lateral views were obtained. There are 2 screws transfixing a fracture of the medial malleolus. There is screw and plate fixation in the distal fibular region. Alignment in these areas is essentially anatomic. The ankle mortise appears intact.  IMPRESSION: Postoperative change in the medial malleolus and distal fibula with alignment essentially anatomic in these areas. Ankle mortise appears intact.   Electronically Signed   By: Lowella Grip III M.D.   On: 06/01/2014  17:15     PERTINENT LAB RESULTS: CBC:  Recent Labs  06/02/14 0554 06/03/14 0516  WBC 9.7 5.7  HGB 9.7* 8.1*  HCT 30.6* 25.7*  PLT 207 165   CMET CMP     Component Value Date/Time   NA 135 06/03/2014 0516   K 3.9 06/03/2014 0516   CL 102 06/03/2014 0516   CO2 29 06/03/2014 0516   GLUCOSE 100* 06/03/2014 0516   BUN 13 06/03/2014 0516   CREATININE 0.76 06/03/2014 0516   CALCIUM 8.5 06/03/2014 0516   PROT 5.2* 05/31/2014 0557  ALBUMIN 2.9* 05/31/2014 0557   AST 23 05/31/2014 0557   ALT 25 05/31/2014 0557   ALKPHOS 66 05/31/2014 0557   BILITOT 0.2* 05/31/2014 0557   GFRNONAA 79* 06/03/2014 0516   GFRAA >90 06/03/2014 0516    GFR Estimated Creatinine Clearance: 50 mL/min (by C-G formula based on Cr of 0.76). No results for input(s): LIPASE, AMYLASE in the last 72 hours. No results for input(s): CKTOTAL, CKMB, CKMBINDEX, TROPONINI in the last 72 hours. Invalid input(s): POCBNP No results for input(s): DDIMER in the last 72 hours. No results for input(s): HGBA1C in the last 72 hours. No results for input(s): CHOL, HDL, LDLCALC, TRIG, CHOLHDL, LDLDIRECT in the last 72 hours.  Recent Labs  05/31/14 1453  TSH 2.522   No results for input(s): VITAMINB12, FOLATE, FERRITIN, TIBC, IRON, RETICCTPCT in the last 72 hours. Coags: No results for input(s): INR in the last 72 hours.  Invalid input(s): PT Microbiology: Recent Results (from the past 240 hour(s))  Surgical pcr screen     Status: None   Collection Time: 06/01/14  1:17 AM  Result Value Ref Range Status   MRSA, PCR NEGATIVE NEGATIVE Final   Staphylococcus aureus NEGATIVE NEGATIVE Final    Comment:        The Xpert SA Assay (FDA approved for NASAL specimens in patients over 39 years of age), is one component of a comprehensive surveillance program.  Test performance has been validated by [facility] for patients greater than or equal to 82 year old. It is not intended to diagnose infection nor to guide  or monitor treatment.      BRIEF HOSPITAL COURSE:  Syncope and collapse:orthostatics positive on 3/22-likely etiology. Continue to hold Losartan/HCTZ on discharge. Echo shows preserved EF, troponin negative. Tele was negative neg.  Active Problems:  Left Trimalleolar fracture of ankle, closed:Ortho consulted, underwent ORIF on 3/23. Per orthopedics, use aspirin for DVT prophylaxis. Patient agreeable to SNF on discharge. Nonweightbearing to the left lower extremity.This MD did speak with Dr Lorin Mercy over the phone, who agreed with discharge. Please ensure follow-up with Dr. Ames Coupe in 1 week   JOA:CZYSAYT pre-renal azotemia in a setting of diuretic use. Stopped Losartan/HCTZ. Resolved with hydration. Please continue to monitor electrolytes periodically while at SNF   Anemia: Suspect secondary to perioperative blood loss and dilution from IV fluids. No indication of blood transfusion. Monitor CBC periodically   COPD (chronic obstructive pulmonary disease):lungs clear. Continue nebs as noted above in the discharge medications   Chronic Resp Failure:on home O2 2l/m-Continue   HTN (hypertension): BP currently controlled without any anti-hypertensives. Follow   GERD:Stable, continue PPI   Lung nodule seen in CT scan - patient will need close follow-up once discharged   TODAY-DAY OF DISCHARGE:  Subjective:   [name] today has no headache,no chest abdominal pain,no new weakness tingling or numbness, feels much better wants to go home today.   Objective:   Blood pressure 132/52, pulse 100, temperature 98.3 F (36.8 C), temperature source Oral, resp. rate 18, height 5\' 4"  (1.626 m), weight 65.318 kg (144 lb), SpO2 100 %. No intake or output data in the 24 hours ending 06/03/14 0954 Filed Weights   05/30/14 2332  Weight: 65.318 kg (144 lb)    Exam Awake Alert, Oriented *3, No new F.N deficits, Normal affect [place].AT,PERRAL Supple Neck,No JVD, No cervical  lymphadenopathy appriciated.  Symmetrical Chest wall movement, Good air movement bilaterally, CTAB RRR,No Gallops,Rubs or new Murmurs, No Parasternal Heave +ve B.Sounds, Abd  Soft, Non tender, No organomegaly appriciated, No rebound -guarding or rigidity. No Cyanosis, Clubbing or edema, No new Rash or bruise  DISCHARGE CONDITION: Stable  DISPOSITION: SNF  DISCHARGE INSTRUCTIONS:    Activity:  As tolerated with Full fall precautions use walker/cane & assistance as needed. Nonweightbearing to the left lower extremity  Diet recommendation: Heart Healthy diet  Discharge Instructions    Call MD for:  redness, tenderness, or signs of infection (pain, swelling, redness, odor or green/yellow discharge around incision site)    Complete by:  As directed      Diet - low sodium heart healthy    Complete by:  As directed      Increase activity slowly    Complete by:  As directed      Other Restrictions    Complete by:  As directed   NWB to LEFT LOWER EXTREMITY           Follow-up Information    Follow up with Delphina Cahill, MD. Schedule an appointment as soon as possible for a visit in 2 weeks.   Specialty:  Internal Medicine   Contact information:    [place] [id number] [phone]       Follow up with Marybelle Killings, MD. Schedule an appointment as soon as possible for a visit in 1 week.   Specialty:  Orthopedic Surgery   Contact information:   [address] [place] [address] [phone]       Total Time spent on discharge equals 45 minutes.  SignedOren Binet 06/03/2014 9:54 AM

## 2014-06-03 NOTE — Care Management Note (Signed)
CARE MANAGEMENT NOTE 06/03/2014  Patient:  Amanda Palmer, Amanda Palmer   Account Number:  [id number]  Date Initiated:  06/03/2014  Documentation initiated by:  Ricki Miller  Subjective/Objective Assessment:   81 yr old female admitted with left ankle fracture. patient underwent a left ankle ORIF.     Action/Plan:   patient will require shortterm rehab at [facility]. Social worker has arranged. patient will go to [facility].   Anticipated DC Date:  06/03/2014   Anticipated DC Plan:  SKILLED NURSING FACILITY  In-house referral  Clinical Social Worker      DC Planning Services  CM consult      [facility] Choice  NA   Choice offered to / List presented to:     DME arranged  NA        [place] arranged  NA      Status of service:  Completed, signed off Medicare Important Message given?  YES (If response is "NO", the following Medicare IM given date fields will be blank) Date Medicare IM given:  06/03/2014 Medicare IM given by:  Ricki Miller Date Additional Medicare IM given:   Additional Medicare IM given by:    Discharge Disposition:  [place]  Per UR Regulation:  Reviewed for med. necessity/level of care/duration of stay  If discussed at [place] of Stay Meetings, dates discussed:

## 2014-06-03 NOTE — Progress Notes (Signed)
Patient ID: Amanda Palmer, female   DOB: 28-Jul-1933, 81 y.o.   MRN: [id number]   This is an acute visit.  Level care skilled.  Facility [facility].  Chief complaint-acute visit status post hospitalization with history of syncope-left ankle fracture.  History of present illness.  Patient is a 81 year old female who was recently discharged from the hospital after being treated for COPD exasperation-she presented back to the ER because she had loss of consciousness-she was also found to have a left ankle fracture.  The syncope was thought to be orthostatic related-her losartan hydrochlorothiazide was held-echo showed preserved ejection fraction negative troponin telemetry was negative.  In regards to the left trimalleolar fracture-orthopedics was consulted and she underwent an ORIF on March 23-she is on aspirin for DVT prophylaxis.  She is here for for rehabilitation currently nonweightbearing.  Patient also had apparently renal insufficiency thought prerenal secondary to diuretics-again her losartan and hydrochlorothiazide was DC'd and apparently this resolved with hydration discharge BUN 13 creatinine 0.76.  She also had anemia this was thought secondary to perioperative blood loss and dilution from IV fluids-it appears the CBC done in the hospital this morning was 8.1-we will need to recheck this tomorrow.  In regards to COPD this was stable during her most recent hospitalization she is on nebulizers she is also on oxygen.  Of note she did have a lung nodule seen on his CT scan this will need outpatient follow-up--again recommendation for follow-up CT scan  Currently she has no acute complaints she is resting in bed comfortably vital signs appear to be stable I got a blood pressure of 120/60-.  Previous medical history.  History of syncope and collapse thought orthostatic.  Left ankle fracture surgically repaired.  COPD.  Acute renal failure resolved.  She does have a  previous history of breast cancer status post right mastectomy.  Also a history of bilateral knee replacements.  Right hip arthroplasty.  Left rotator cuff repair.  Family medical social history.  Patient is a widow-no apparent tobacco alcohol or illicit drug use.  Family history nonpertinent.  Medications.  Klonopin 1 mg daily at bedtime.  Vicodin 5-3 25 mg one tablet every 6 hours when necessary.  Albuterol nebulizer every 6 hours when necessary.  Proventil inhaler 1 or 2 puffs into the lungs every 6 hours when necessary.  Brovana nebulizer twice a day.  Aspirin 325 mg daily.  Lipitor 20 mg daily at bedtime.  Pulmicort nebulizers twice a day.  Calcium with vitamin D twice a day.  Nexium 40 mg every noon.  Neurontin 300 mg daily at bedtime.  Singulair 10 mg daily.  Multivitamin daily.  Nitroglycerin 0.4 mg under 10 every 5 minutes when necessary.  Effexor 75 mg 3 times a day.  Review of systems.  In general denies any fever or chills.  Skin does not complain of itching or rashes.  Head ears eyes nose mouth and throat does not complain of visual changes or sore throat.  Respiratory history of COPD does not complain of increased shortness of breath from baseline.  Cardiac does not complain of chest pain does not have significant edema on leg was able to assess on the left leg secondary to it being covered status post ankle fracture repair.  GI does not complain of nausea vomiting diarrhea constipation or abdominal discomfort does have a history of GERD.  GU does not complain of dysuria.  Musculoskeletal says at this point pain appears to be controlled she is receiving Norco  when necessary.  Neurologic does not complain of headache dizziness or numbness currently or syncopal-type feelings.  Psych does have some history of anxiety and depression this appears to be stable this evening patient does not acutely complain of this.  Physical  exam.  Temperature 98.1 pulse 88 respirations 20 blood pressure 120/60 manually.  General this is a pleasant elderly female in no distress resting comfortably in bed.  Her skin is warm and dry.  Eyes pupils appear equal round reactive to light sclera and conjunctiva clear visual acuity appears intact.  Oropharynx is clear mucous membranes moist.  Chest she has some slight diffuse expiratory wheezing there is no labored breathing there is somewhat shallow air entry.  Heart is regular rate and rhythm with occasional irregular beats she does not appear to have significant lower extremity edema on the right with a positive pedal pulse left leg difficult to assess secondary to the casting she does have positive capillary refill and warmth of her exposed toes.  Abdomen is soft nontender with positive bowel sounds.  Musculoskeletal did not note any deformities of her right leg or upper extremities bilaterally strength in these extremities appears intact again she does have wrapping of her left lower leg with history of ankle fracture.  Neurologic is grossly intact speech is clear no lateralizing findings.  Psych she is alert and oriented pleasant and appropriate.  Labs.  06/03/2014.  WBC 5.7 hemoglobin 8.1 platelets 165 . 06/02/2014 WBC 9.7 hemoglobin 9.7 platelets 207.  Sodium 135 potassium 3.9 BUN 13 creatinine 0.76  Assessment plan.  #1-history of left ankle fracture this will need followed by orthopedics she is receiving Norco for pain she is on aspirin for anticoagulation at this point appears to be stable.  #2-history of syncope this was thought to be orthostatically related this will have to be monitored blood pressure appears stable this evening-her  anti hypertensives have been discontinued  #3-history of anemia-this was thought to be multifactorial dilutional as well as postop blood loss with check a CBC tomorrow.  #4-history of lung nodule CT scan of cervical  spine-recommendation to repeat CT scan of the chest with follow-up thereafter.  #5-history COPD she is on numerous inhalers as well as nebulizers this appears to be relatively stable she is on oxygen.  History of GERD this appears to be stable on Nexium continue to monitor.  #7-history of depression she is on Effexor at this point monitor this appears stable clinically this evening.  History of suspected neuropathy she is on Neurontin at bedtime area  #9 history hyperlipidemia? She is on a statin since I suspect her stay here will be relatively short will defer a lipid panel at this time-liver function tests in hospital were fairly unremarkable bilirubin was 0.2 do note albumin was 2.9  #10-history renal insufficiency this was thought to have improved will update BMP  [id number]-of note greater than 40 minutes spent assessing patient-reviewing her chart-and coordinating and formulating a plan of care for numerous diagnoses-of note greater than 50% of time spent coordinating plan of care

## 2014-06-03 NOTE — Addendum Note (Signed)
Addendum  created 06/03/14 1049 by Lorrene Reid, MD   Modules edited: Anesthesia Attestations

## 2014-06-03 NOTE — Care Management Note (Signed)
Second IM letter given. Ricki Miller, RN BSN Case Manager

## 2014-06-04 ENCOUNTER — Encounter: Payer: Self-pay | Admitting: Internal Medicine

## 2014-06-04 ENCOUNTER — Other Ambulatory Visit (HOSPITAL_COMMUNITY)
Admission: RE | Admit: 2014-06-04 | Discharge: 2014-06-04 | Disposition: A | Discharge status: Home / Self Care | Payer: Medicare Other | Source: Other Acute Inpatient Hospital | Attending: Internal Medicine | Admitting: Internal Medicine

## 2014-06-04 LAB — BASIC METABOLIC PANEL
ANION GAP: 4 — AB (ref 5–15)
BUN: 11 mg/dL (ref 6–23)
CALCIUM: 8.6 mg/dL (ref 8.4–10.5)
CO2: 32 mmol/L (ref 19–32)
Chloride: 104 mmol/L (ref 96–112)
Creatinine, Ser: 0.87 mg/dL (ref 0.50–1.10)
GFR calc non Af Amer: 62 mL/min — ABNORMAL LOW (ref >90)
GFR, EST AFRICAN AMERICAN: 72 mL/min — AB (ref >90)
GLUCOSE: 87 mg/dL (ref 70–99)
POTASSIUM: 4 mmol/L (ref 3.5–5.1)
Sodium: 140 mmol/L (ref 135–145)

## 2014-06-04 LAB — CBC
HEMATOCRIT: 27.5 % — AB (ref 36.0–46.0)
Hemoglobin: 8.9 g/dL — ABNORMAL LOW (ref 12.0–15.0)
MCH: 31 pg (ref 26.0–34.0)
MCHC: 32.4 g/dL (ref 30.0–36.0)
MCV: 95.8 fL (ref 78.0–100.0)
PLATELETS: 197 10*3/uL (ref 150–400)
RBC: 2.87 MIL/uL — AB (ref 3.87–5.11)
RDW: 14 % (ref 11.5–15.5)
WBC: 4.4 10*3/uL (ref 4.0–10.5)

## 2014-06-05 ENCOUNTER — Other Ambulatory Visit (HOSPITAL_COMMUNITY)
Admission: RE | Admit: 2014-06-05 | Discharge: 2014-06-05 | Disposition: A | Discharge status: Home / Self Care | Payer: Medicare Other | Attending: Internal Medicine | Admitting: Internal Medicine

## 2014-06-05 LAB — OCCULT BLOOD X 1 CARD TO LAB, STOOL: Fecal Occult Bld: NEGATIVE

## 2014-06-06 ENCOUNTER — Other Ambulatory Visit: Payer: Self-pay | Admitting: *Deleted

## 2014-06-06 ENCOUNTER — Encounter (HOSPITAL_COMMUNITY)
Admission: RE | Admit: 2014-06-06 | Discharge: 2014-06-06 | Disposition: A | Discharge status: Home / Self Care | Payer: Medicare Other | Source: Skilled Nursing Facility | Attending: Internal Medicine | Admitting: Internal Medicine

## 2014-06-06 ENCOUNTER — Encounter (HOSPITAL_COMMUNITY): Payer: Self-pay | Admitting: Orthopaedic Surgery

## 2014-06-06 LAB — CBC
HCT: 25.6 % — ABNORMAL LOW (ref 36.0–46.0)
HEMOGLOBIN: 8.2 g/dL — AB (ref 12.0–15.0)
MCH: 30.6 pg (ref 26.0–34.0)
MCHC: 32 g/dL (ref 30.0–36.0)
MCV: 95.5 fL (ref 78.0–100.0)
Platelets: 210 10*3/uL (ref 150–400)
RBC: 2.68 MIL/uL — ABNORMAL LOW (ref 3.87–5.11)
RDW: 14.2 % (ref 11.5–15.5)
WBC: 3.6 10*3/uL — AB (ref 4.0–10.5)

## 2014-06-06 MED ORDER — CLONAZEPAM 1 MG PO TABS: 1.0000 mg | ORAL_TABLET | Freq: Every day | ORAL | Status: DC

## 2014-06-06 MED ORDER — HYDROCODONE-ACETAMINOPHEN 5-325 MG PO TABS: 1.0000 | ORAL_TABLET | Freq: Four times a day (QID) | ORAL | Status: DC | PRN

## 2014-06-09 ENCOUNTER — Other Ambulatory Visit (HOSPITAL_COMMUNITY)
Admission: AD | Admit: 2014-06-09 | Discharge: 2014-06-09 | Disposition: A | Discharge status: Home / Self Care | Payer: Medicare Other | Source: Skilled Nursing Facility | Attending: Internal Medicine | Admitting: Internal Medicine

## 2014-06-09 LAB — BASIC METABOLIC PANEL
ANION GAP: 5 (ref 5–15)
BUN: 17 mg/dL (ref 6–23)
CHLORIDE: 105 mmol/L (ref 96–112)
CO2: 31 mmol/L (ref 19–32)
CREATININE: 0.92 mg/dL (ref 0.50–1.10)
Calcium: 8.9 mg/dL (ref 8.4–10.5)
GFR calc non Af Amer: 58 mL/min — ABNORMAL LOW (ref >90)
GFR, EST AFRICAN AMERICAN: 67 mL/min — AB (ref >90)
Glucose, Bld: 93 mg/dL (ref 70–99)
Potassium: 4.1 mmol/L (ref 3.5–5.1)
SODIUM: 141 mmol/L (ref 135–145)

## 2014-06-09 LAB — CBC
HCT: 26.5 % — ABNORMAL LOW (ref 36.0–46.0)
HEMOGLOBIN: 8.4 g/dL — AB (ref 12.0–15.0)
MCH: 30.4 pg (ref 26.0–34.0)
MCHC: 31.7 g/dL (ref 30.0–36.0)
MCV: 96 fL (ref 78.0–100.0)
Platelets: 258 10*3/uL (ref 150–400)
RBC: 2.76 MIL/uL — AB (ref 3.87–5.11)
RDW: 14.5 % (ref 11.5–15.5)
WBC: 3.3 10*3/uL — ABNORMAL LOW (ref 4.0–10.5)

## 2014-06-13 ENCOUNTER — Other Ambulatory Visit: Payer: Self-pay | Admitting: *Deleted

## 2014-06-13 ENCOUNTER — Non-Acute Institutional Stay (SKILLED_NURSING_FACILITY): Payer: Medicare Other | Admitting: Internal Medicine

## 2014-06-13 DIAGNOSIS — S82852D Displaced trimalleolar fracture of left lower leg, subsequent encounter for closed fracture with routine healing: Secondary | ICD-10-CM

## 2014-06-13 DIAGNOSIS — I951 Orthostatic hypotension: Secondary | ICD-10-CM

## 2014-06-13 DIAGNOSIS — J441 Chronic obstructive pulmonary disease with (acute) exacerbation: Secondary | ICD-10-CM

## 2014-06-13 DIAGNOSIS — I499 Cardiac arrhythmia, unspecified: Secondary | ICD-10-CM

## 2014-06-13 NOTE — Progress Notes (Signed)
Patient ID: Amanda Palmer, female   DOB: Apr 09, 1933, 81 y.o.   MRN: [id number]  Facility; [facility] Chief complaint; admission to SNF post admit to [facility] from 3/21  to 3/25  History; this is a patient who is recently hospitalized for 2 days secondary to exacerbation of her COPD. She tells me she was at home in the doing reasonably well. She suddenly became syncopal and fell. Far as I am able to determine she was conscious when she was on the floor. She was discovered to have a trimalleolar fracture on the left ankle. She underwent surgical repair by Dr. Lorin Mercy. She underwent a CT scan of the head and a CT scan of the cervical spine. A CT scan of the head was negative. CT scan of the cervical spine showed anterolisthesis of C4 and C5 C5 on C6. She was incidentally discovered to have a 3.5 mm left upper lobe nodule. As she would be high risk for carcinoma follow-up CT scan of the chest in 6-12 months was recommended. The patient tells me she had a similar episode the 6 months ago but did not suffer an injury. She was worked up for this syncope/presyncope. The she was felt to have positive orthostatics. Her losartan and hydrochlorothiazide were put on hold and echocardiogram showed preserved EF troponins were negative and telemetry was negative for. According to the patient there was some suggestion she should wear a monitor however I'm not exactly sure where that is.  With regards to other issues she was felt to have acute renal failure with prerenal azotemia again her losartan and hydrochlorothiazide were stopped.  She has COPD uses nebulizers at home. She uses oxygen 1 L at night only  Past Medical History  Diagnosis Date  . Cancer     breast  . Hypertension   . COPD (chronic obstructive pulmonary disease)    Past Surgical History  Procedure Laterality Date  . Cardiac catheterization    . Total knee arthroplasty      bilateral  . Total hip arthroplasty      right  . Rotator cuff repair       left  . Mastectomy      right   . Orif ankle fracture Left 06/01/2014    Procedure: OPEN REDUCTION INTERNAL FIXATION (ORIF) LEFT ANKLE FRACTURE;  Surgeon: Marybelle Killings, MD;  Location: [place];  Service: Orthopedics;  Laterality: Left;   Current Outpatient Prescriptions on File Prior to Visit  Medication Sig Dispense Refill  . albuterol (PROVENTIL HFA;VENTOLIN HFA) 108 (90 BASE) MCG/ACT inhaler Inhale 1-2 puffs into the lungs every 6 (six) hours as needed for wheezing or shortness of breath.    Marland Kitchen albuterol (PROVENTIL) (2.5 MG/3ML) 0.083% nebulizer solution Take 2.5 mg by nebulization every 6 (six) hours as needed for wheezing or shortness of breath.    Marland Kitchen arformoterol (BROVANA) 15 MCG/2ML NEBU Take 15 mcg by nebulization 2 (two) times daily.     Marland Kitchen aspirin 325 MG tablet Take 325 mg by mouth daily.    Marland Kitchen atorvastatin (LIPITOR) 20 MG tablet Take 20 mg by mouth every evening.     . budesonide (PULMICORT) 0.25 MG/2ML nebulizer solution Take 0.25 mg by nebulization 2 (two) times daily.     . Calcium Carb-Cholecalciferol (CALCIUM + D3) 600-200 MG-UNIT TABS Take 1 tablet by mouth 2 (two) times daily.     . clonazePAM (KLONOPIN) 1 MG tablet Take 1 tablet (1 mg total) by mouth at bedtime. 30 tablet  0  . esomeprazole (NEXIUM) 40 MG capsule Take 40 mg by mouth daily at 12 noon.    . gabapentin (NEURONTIN) 300 MG capsule Take 300 mg by mouth at bedtime.     Marland Kitchen HYDROcodone-acetaminophen (NORCO/VICODIN) 5-325 MG per tablet Take 1 tablet by mouth every 6 (six) hours as needed for moderate pain. 60 tablet 0  . montelukast (SINGULAIR) 10 MG tablet Take 10 mg by mouth daily.    . Multiple Vitamin (MULTIVITAMIN WITH MINERALS) TABS tablet Take 1 tablet by mouth daily.    . nitroGLYCERIN (NITROSTAT) 0.4 MG SL tablet Place 0.4 mg under the tongue every 5 (five) minutes as needed for chest pain.     Marland Kitchen venlafaxine (EFFEXOR) 75 MG tablet Take 75 mg by mouth 3 (three) times daily with meals.        social: As I  understand things she probably lives in subsidized senior housing in [place]. The last she was independent with ADLs and IADLs still driving still doing her own grocery shopping.  reports that she has never smoked. She has never used smokeless tobacco. She reports that she does not drink alcohol or use illicit drugs.  Review of systems Respiratory; no cough. States her shortness of breath is not quite at her baseline Cardiac no chest pain and no current palpitations GI no abdominal pain no change in bowel habits GU no dysuria.  Physical examination Gen. patient is awake alert cognitively intact Vitals O2 sat is 98% on room air respirations 20 pulse rate 100 Respiratory; air entry is reduced however not as much as I might of thought. There is no wheezing her work of breathing is normal accessory muscle use is not seen Cardiac; while I was listening to her heart I thought there might be some irregularity although palpating her pulse felt to be a regular rhythm. There is no murmur she is euvolemic. Abdomen; no liver no spleen no tenderness. Extremities bilateral total knee replacements in the past. Her left ankle is in a cam walker apparently going to follow-up with Dr. Lorin Mercy of Thursday this week  Impression/plan #1 syncopal/presyncopal fall resulting in a left trimalleolar ankle fracture status post ORIF. The leg is in a cam walker I did not remove this. His is not the first event of this type that she's had the. The feeling in the hospital was that this was likely to be orthostatic hypotension. Her losartan/hydrochlorothiazide was stopped. I would like to recheck this myself when I'm able to do this. #2 orthostatic hypotension see discussion above. I would like to check this myself. #3 history of hypertension see discussion above #4 chronic obstructive pulmonary disease she is on oxygen at night at home although she tells me that she had never had a sleep study. She is on nebulizers at home which  she is used to using. #5 gastroesophageal reflux on PPI #6 incidentally discovered a nodule in the left upper lobe 3.5 mm nodule. She probably should have a CT scan done in 6 months to recheck this as she is certainly at high risk for carcinoma. #7 depression on Effexor this appears to be stable  Subsidized seniors apartments usually demand complete independence. The ankle fracture will make this difficult. I would like to check the orthostatic blood pressure drop myself at some point.

## 2014-06-14 ENCOUNTER — Encounter (HOSPITAL_COMMUNITY)
Admission: AD | Admit: 2014-06-14 | Discharge: 2014-06-14 | Disposition: A | Discharge status: Home / Self Care | Payer: Medicare Other | Source: Skilled Nursing Facility | Attending: Internal Medicine | Admitting: Internal Medicine

## 2014-06-14 ENCOUNTER — Telehealth: Payer: Self-pay | Admitting: Cardiovascular Disease

## 2014-06-14 DIAGNOSIS — R69 Illness, unspecified: Secondary | ICD-10-CM | POA: Insufficient documentation

## 2014-06-14 LAB — CBC WITH DIFFERENTIAL/PLATELET
BASOS PCT: 0 % (ref 0–1)
Basophils Absolute: 0 10*3/uL (ref 0.0–0.1)
Eosinophils Absolute: 0.1 10*3/uL (ref 0.0–0.7)
Eosinophils Relative: 3 % (ref 0–5)
HEMATOCRIT: 27.2 % — AB (ref 36.0–46.0)
Hemoglobin: 8.8 g/dL — ABNORMAL LOW (ref 12.0–15.0)
LYMPHS ABS: 0.8 10*3/uL (ref 0.7–4.0)
Lymphocytes Relative: 26 % (ref 12–46)
MCH: 30.8 pg (ref 26.0–34.0)
MCHC: 32.4 g/dL (ref 30.0–36.0)
MCV: 95.1 fL (ref 78.0–100.0)
Monocytes Absolute: 0.2 10*3/uL (ref 0.1–1.0)
Monocytes Relative: 8 % (ref 3–12)
Neutro Abs: 1.9 10*3/uL (ref 1.7–7.7)
Neutrophils Relative %: 63 % (ref 43–77)
PLATELETS: 275 10*3/uL (ref 150–400)
RBC: 2.86 MIL/uL — AB (ref 3.87–5.11)
RDW: 15.1 % (ref 11.5–15.5)
WBC: 3 10*3/uL — AB (ref 4.0–10.5)

## 2014-06-14 NOTE — Telephone Encounter (Signed)
Amanda Palmer called to say that her sister received her heart monitor. [name] is at the [facility] for at least Two more weeks.

## 2014-06-15 DIAGNOSIS — R002 Palpitations: Secondary | ICD-10-CM | POA: Diagnosis not present

## 2014-06-16 DIAGNOSIS — S82852S Displaced trimalleolar fracture of left lower leg, sequela: Secondary | ICD-10-CM | POA: Diagnosis not present

## 2014-06-20 ENCOUNTER — Encounter (HOSPITAL_COMMUNITY)
Admission: RE | Admit: 2014-06-20 | Discharge: 2014-06-20 | Disposition: A | Discharge status: Home / Self Care | Payer: Medicare Other | Source: Skilled Nursing Facility | Attending: Internal Medicine | Admitting: Internal Medicine

## 2014-06-20 LAB — CBC WITH DIFFERENTIAL/PLATELET
Basophils Absolute: 0 10*3/uL (ref 0.0–0.1)
Basophils Relative: 0 % (ref 0–1)
Eosinophils Absolute: 0.1 10*3/uL (ref 0.0–0.7)
Eosinophils Relative: 4 % (ref 0–5)
HEMATOCRIT: 26.8 % — AB (ref 36.0–46.0)
HEMOGLOBIN: 8.3 g/dL — AB (ref 12.0–15.0)
LYMPHS ABS: 1.1 10*3/uL (ref 0.7–4.0)
LYMPHS PCT: 33 % (ref 12–46)
MCH: 30.2 pg (ref 26.0–34.0)
MCHC: 31 g/dL (ref 30.0–36.0)
MCV: 97.5 fL (ref 78.0–100.0)
MONOS PCT: 11 % (ref 3–12)
Monocytes Absolute: 0.4 10*3/uL (ref 0.1–1.0)
NEUTROS ABS: 1.7 10*3/uL (ref 1.7–7.7)
Neutrophils Relative %: 52 % (ref 43–77)
Platelets: 279 10*3/uL (ref 150–400)
RBC: 2.75 MIL/uL — ABNORMAL LOW (ref 3.87–5.11)
RDW: 15.7 % — ABNORMAL HIGH (ref 11.5–15.5)
WBC: 3.3 10*3/uL — ABNORMAL LOW (ref 4.0–10.5)

## 2014-06-23 ENCOUNTER — Other Ambulatory Visit: Payer: Self-pay | Admitting: *Deleted

## 2014-06-23 MED ORDER — HYDROCODONE-ACETAMINOPHEN 5-325 MG PO TABS: 1.0000 | ORAL_TABLET | Freq: Four times a day (QID) | ORAL | Status: DC | PRN

## 2014-06-23 NOTE — Telephone Encounter (Signed)
[facility] 

## 2014-06-28 ENCOUNTER — Encounter: Payer: Self-pay | Admitting: Internal Medicine

## 2014-06-28 ENCOUNTER — Non-Acute Institutional Stay (SKILLED_NURSING_FACILITY): Payer: Medicare Other | Admitting: Internal Medicine

## 2014-06-28 DIAGNOSIS — S82852D Displaced trimalleolar fracture of left lower leg, subsequent encounter for closed fracture with routine healing: Secondary | ICD-10-CM

## 2014-06-28 DIAGNOSIS — I10 Essential (primary) hypertension: Secondary | ICD-10-CM | POA: Diagnosis not present

## 2014-06-28 DIAGNOSIS — I499 Cardiac arrhythmia, unspecified: Secondary | ICD-10-CM | POA: Diagnosis not present

## 2014-06-28 DIAGNOSIS — J441 Chronic obstructive pulmonary disease with (acute) exacerbation: Secondary | ICD-10-CM | POA: Diagnosis not present

## 2014-06-28 NOTE — Progress Notes (Signed)
Patient ID: Amanda Palmer, female   DOB: Mar 17, 1933, 81 y.o.   MRN: [id number]      This is an a discharge notet.  Level care skilled.  Facility [facility].  Chief complaint-discharge note.  History of present illness.  Patient is a 81 year old female who was recently discharged from the hospital after being treated for COPD exasperation-she presented back to the ER because she had loss of consciousness-she was also found to have a left ankle fracture.  The syncope was thought to be orthostatic related-her losartan hydrochlorothiazide was held-echo showed preserved ejection fraction negative troponin telemetry was negative.  In regards to the left trimalleolar fracture-orthopedics was consulted and she underwent an ORIF on March 23-she is on aspirin for DVT prophylaxis--she will need continued PT and OT as an outpatient.  She will be going home to live with her sister who apparently is quite supportive she will need a wheelchair with a removable right side since she is still largely ambulating in a wheelchair she stillhas a cam walker. On her rleft lower leg  He is followed by orthopedics. In the hospital Patient also had apparently renal insufficiency thought prerenal secondary to diuretics-again her losartan and hydrochlorothiazide was DC'd and apparently this resolved with hydration--metabolic panel on March 31 showed stabilization with a BUN of 17 creatinine 0.92  She also had anemia this was thought secondary to perioperative blood loss and dilution from IV fluids-it appears the CBC done in the hospital for discharge was 8.1-we did start her on iron-it appears hemoglobin trended up slowly up to 8.8---2 weeks ago however on lab done April 11 hemoglobin was 8.3 appears she is largely been in the 8 range during her stay here-- Occultl blood testing so far has been negative  In regards to COPD this was stable during her most recent hospitalization she is on nebulizers -- O2  saturations have been satisfactory even off oxygen  Of note she did have a lung nodule seen on his CT scan this will need outpatient follow-up--again recommendation for follow-up CT scan in about 6 months   She has been noted to have a possible arrhythmia cardiology is following this and she actually has a monitor on.  She reports occasionally she has shaking episodes of short duration-these apparently quickly resolve--she says this has been going on for some time and is thought possibly a cardiac source arrhythmia may be causing this--.  Previous medical history.  History of syncope and collapse thought orthostatic.  Left ankle fracture surgically repaired.  COPD.  Acute renal failure resolved.  She does have a previous history of breast cancer status post right mastectomy.  Also a history of bilateral knee replacements.  Right hip arthroplasty.  Left rotator cuff repair.   Family medical social history.  Patient is a widow-no apparent tobacco alcohol or illicit drug use.--Apparently lives in senior subsidized housing  Family history nonpertinent.  Medications.  Klonopin 1 mg daily at bedtime.  Vicodin 5-3 25 mg one tablet every 6 hours when necessary.  Albuterol nebulizer every 6 hours when necessary.  Proventil inhaler 1 or 2 puffs into the lungs every 6 hours when necessary.  Brovana nebulizer twice a day.  Aspirin 325 mg daily.  Lipitor 20 mg daily at bedtime.  Pulmicort nebulizers twice a day.  Calcium with vitamin D twice a day  Prilosec 40 mg daily.  Neurontin 300 mg daily at bedtime.  Singulair 10 mg daily.  Multivitamin daily.  Nitroglycerin 0.4 mg under 10 every  5 minutes when necessary.  Effexor 75 mg 3 times a day.  Iron 325 mg by mouth twice a day  Review of systems.  In general denies any fever or chills.  Skin does not complain of itching or rashes.  Head ears eyes nose mouth and throat does not complain of visual changes or  sore throat.  Respiratory history of COPD does not complain of increased shortness of breath from baseline.  Cardiac does not complain of chest pain does not have significant edema on leg was able to assess on the left leg secondary to it being covered status post ankle fracture repairin a camboot.  GI does not complain of nausea vomiting diarrhea constipation or abdominal discomfort does have a history of GERD.  GU does not complain of dysuria.  Musculoskeletal says at this point pain appears to be controlled she is receiving Norco when necessary.  Neurologic does not complain of headache --. Does report occasional shaky episodes as noted above which apparently have been a long-term situation  Psych does have some history of anxiety and depression this appears to be stable ---patient does not acutely complain of this.  Physical exam.  He is afebrile pulse 80 respirations 18 blood pressure taken manually 162/70 2I see variable readings ranging from 102 systolically to 58N systolically again there is some history of hypotension here as well-O2 saturations have been in the 90s on room air weight appears to be stable at 154.9  General this is a pleasant elderly female in no distress eating in her wheelchair.  Her skin is warm and dry.  Eyes pupils appear equal round reactive to light sclera and conjunctiva clear visual acuity appears intact.  Oropharynx is clear mucous membranes moist.  Chest---could  not really appreciate any congestion there is no labored breathing there is somewhat shallow air entry.  Heart is regular rate and rhythm with occasional irregular beats she does not appear to have significant lower extremity edema on the right----e left leg difficult to assess secondary to the cam walker she does have positive capillary refill and warmth of her exposed toes.  Abdomen is soft nontender with positive bowel sounds.  Musculoskeletal did not note any deformities of her right  leg or upper extremities bilaterally strength in these extremities appears intact again --she has a cam walker applied to her left lower leg-ambulating and wheelchair  Neurologic is grossly intact speech is clear no lateralizing findings.  Psych she is alert and oriented pleasant and appropriate.  Labs.  06/09/2014.  Sodium 141 potassium 4.1 BUN 17 creatinine 0.92.  06/20/2014.  WBC 3.3 hemoglobin 8.3 platelets 279.  Of note previous hemoglobins during his stay here ranged from 8.2-8.9  06/03/2014.  WBC 5.7 hemoglobin 8.1 platelets 165 . 06/02/2014 WBC 9.7 hemoglobin 9.7 platelets 207.  Sodium 135 potassium 3.9 BUN 13 creatinine 0.76  Assessment plan.  #1-history of left ankle fracture this will need followed by orthopedics she is receiving Norco for pain she is on aspirin for anticoagulation at this point appears to be stable--she will need continued PT of note she.  #2-history of syncope this was thought to be orthostatically related--?? Arrhythmia -her  anti hypertensives have been discontinued-- does have a heart monitor this is followed by cardiology-  #3-history of anemia-this was thought to be multifactorial dilutional as well as postop blood loss --she has been started on iron hemoglobin appears to hover in the eights-follow-up with primary care provider as needed clinically she appears stable.--We will update CBC  before discharge note notify primary care provider of results  #4-history of lung nodule CT scan of cervical spine-recommendation to repeat CT scan of the chest in approximately 5 months.  #5-history COPD she is on numerous inhalers as well as nebulizers this appears to be relatively stable   History of GERD this appears to be stable on prilosec continue to monitor.  #7-history of depression she is on Effexor a.  History of suspected neuropathy she is on Neurontin at bedtime   #9 history hyperlipidemia? Was not aggressive pursuing a lipid panel since her  stay here has been relatively short-liver function tests in hospital were fairly unremarkable bilirubin was 0.2 do note albumin was 2.9  #10-history renal insufficiency this appears stable will updated metabolic panel  Patient will be going home with her sister who apparently is quite supportive she will need PT OT as well as nursing support for multiple medical issues including follow-up of cardiac issues and left ankle fracture.  She continues under nonweightbearing status- does have follow-up with orthopedics pulmonary and cardiology as well as primary care provider  [phone] note greater than 30 minutes spent on this discharge summary.

## 2014-06-30 DIAGNOSIS — J449 Chronic obstructive pulmonary disease, unspecified: Secondary | ICD-10-CM | POA: Diagnosis not present

## 2014-06-30 DIAGNOSIS — Z5181 Encounter for therapeutic drug level monitoring: Secondary | ICD-10-CM | POA: Diagnosis not present

## 2014-06-30 DIAGNOSIS — I10 Essential (primary) hypertension: Secondary | ICD-10-CM | POA: Diagnosis not present

## 2014-06-30 DIAGNOSIS — I129 Hypertensive chronic kidney disease with stage 1 through stage 4 chronic kidney disease, or unspecified chronic kidney disease: Secondary | ICD-10-CM | POA: Diagnosis not present

## 2014-06-30 DIAGNOSIS — N189 Chronic kidney disease, unspecified: Secondary | ICD-10-CM | POA: Diagnosis not present

## 2014-06-30 DIAGNOSIS — S82852D Displaced trimalleolar fracture of left lower leg, subsequent encounter for closed fracture with routine healing: Secondary | ICD-10-CM | POA: Diagnosis not present

## 2014-06-30 DIAGNOSIS — F329 Major depressive disorder, single episode, unspecified: Secondary | ICD-10-CM | POA: Diagnosis not present

## 2014-07-01 DIAGNOSIS — J449 Chronic obstructive pulmonary disease, unspecified: Secondary | ICD-10-CM | POA: Diagnosis not present

## 2014-07-01 DIAGNOSIS — I10 Essential (primary) hypertension: Secondary | ICD-10-CM | POA: Diagnosis not present

## 2014-07-01 DIAGNOSIS — F329 Major depressive disorder, single episode, unspecified: Secondary | ICD-10-CM | POA: Diagnosis not present

## 2014-07-01 DIAGNOSIS — S82852D Displaced trimalleolar fracture of left lower leg, subsequent encounter for closed fracture with routine healing: Secondary | ICD-10-CM | POA: Diagnosis not present

## 2014-07-01 DIAGNOSIS — Z5181 Encounter for therapeutic drug level monitoring: Secondary | ICD-10-CM | POA: Diagnosis not present

## 2014-07-04 DIAGNOSIS — I10 Essential (primary) hypertension: Secondary | ICD-10-CM | POA: Diagnosis not present

## 2014-07-04 DIAGNOSIS — Z5181 Encounter for therapeutic drug level monitoring: Secondary | ICD-10-CM | POA: Diagnosis not present

## 2014-07-04 DIAGNOSIS — F329 Major depressive disorder, single episode, unspecified: Secondary | ICD-10-CM | POA: Diagnosis not present

## 2014-07-04 DIAGNOSIS — N189 Chronic kidney disease, unspecified: Secondary | ICD-10-CM | POA: Diagnosis not present

## 2014-07-04 DIAGNOSIS — J449 Chronic obstructive pulmonary disease, unspecified: Secondary | ICD-10-CM | POA: Diagnosis not present

## 2014-07-04 DIAGNOSIS — S82852D Displaced trimalleolar fracture of left lower leg, subsequent encounter for closed fracture with routine healing: Secondary | ICD-10-CM | POA: Diagnosis not present

## 2014-07-06 DIAGNOSIS — Z5181 Encounter for therapeutic drug level monitoring: Secondary | ICD-10-CM | POA: Diagnosis not present

## 2014-07-06 DIAGNOSIS — F329 Major depressive disorder, single episode, unspecified: Secondary | ICD-10-CM | POA: Diagnosis not present

## 2014-07-06 DIAGNOSIS — S82852D Displaced trimalleolar fracture of left lower leg, subsequent encounter for closed fracture with routine healing: Secondary | ICD-10-CM | POA: Diagnosis not present

## 2014-07-06 DIAGNOSIS — J449 Chronic obstructive pulmonary disease, unspecified: Secondary | ICD-10-CM | POA: Diagnosis not present

## 2014-07-06 DIAGNOSIS — I10 Essential (primary) hypertension: Secondary | ICD-10-CM | POA: Diagnosis not present

## 2014-07-08 DIAGNOSIS — F329 Major depressive disorder, single episode, unspecified: Secondary | ICD-10-CM | POA: Diagnosis not present

## 2014-07-08 DIAGNOSIS — I10 Essential (primary) hypertension: Secondary | ICD-10-CM | POA: Diagnosis not present

## 2014-07-08 DIAGNOSIS — J449 Chronic obstructive pulmonary disease, unspecified: Secondary | ICD-10-CM | POA: Diagnosis not present

## 2014-07-08 DIAGNOSIS — Z5181 Encounter for therapeutic drug level monitoring: Secondary | ICD-10-CM | POA: Diagnosis not present

## 2014-07-08 DIAGNOSIS — S82852D Displaced trimalleolar fracture of left lower leg, subsequent encounter for closed fracture with routine healing: Secondary | ICD-10-CM | POA: Diagnosis not present

## 2014-07-11 DIAGNOSIS — S82852D Displaced trimalleolar fracture of left lower leg, subsequent encounter for closed fracture with routine healing: Secondary | ICD-10-CM | POA: Diagnosis not present

## 2014-07-11 DIAGNOSIS — Z5181 Encounter for therapeutic drug level monitoring: Secondary | ICD-10-CM | POA: Diagnosis not present

## 2014-07-11 DIAGNOSIS — I10 Essential (primary) hypertension: Secondary | ICD-10-CM | POA: Diagnosis not present

## 2014-07-11 DIAGNOSIS — J449 Chronic obstructive pulmonary disease, unspecified: Secondary | ICD-10-CM | POA: Diagnosis not present

## 2014-07-11 DIAGNOSIS — F329 Major depressive disorder, single episode, unspecified: Secondary | ICD-10-CM | POA: Diagnosis not present

## 2014-07-12 ENCOUNTER — Encounter: Payer: Self-pay | Admitting: *Deleted

## 2014-07-12 DIAGNOSIS — Z5181 Encounter for therapeutic drug level monitoring: Secondary | ICD-10-CM | POA: Diagnosis not present

## 2014-07-12 DIAGNOSIS — J449 Chronic obstructive pulmonary disease, unspecified: Secondary | ICD-10-CM | POA: Diagnosis not present

## 2014-07-12 DIAGNOSIS — I10 Essential (primary) hypertension: Secondary | ICD-10-CM | POA: Diagnosis not present

## 2014-07-12 DIAGNOSIS — S82852D Displaced trimalleolar fracture of left lower leg, subsequent encounter for closed fracture with routine healing: Secondary | ICD-10-CM | POA: Diagnosis not present

## 2014-07-12 DIAGNOSIS — F329 Major depressive disorder, single episode, unspecified: Secondary | ICD-10-CM | POA: Diagnosis not present

## 2014-07-13 DIAGNOSIS — I10 Essential (primary) hypertension: Secondary | ICD-10-CM | POA: Diagnosis not present

## 2014-07-13 DIAGNOSIS — J449 Chronic obstructive pulmonary disease, unspecified: Secondary | ICD-10-CM | POA: Diagnosis not present

## 2014-07-13 DIAGNOSIS — S82852D Displaced trimalleolar fracture of left lower leg, subsequent encounter for closed fracture with routine healing: Secondary | ICD-10-CM | POA: Diagnosis not present

## 2014-07-13 DIAGNOSIS — F329 Major depressive disorder, single episode, unspecified: Secondary | ICD-10-CM | POA: Diagnosis not present

## 2014-07-13 DIAGNOSIS — Z5181 Encounter for therapeutic drug level monitoring: Secondary | ICD-10-CM | POA: Diagnosis not present

## 2014-07-14 ENCOUNTER — Ambulatory Visit (INDEPENDENT_AMBULATORY_CARE_PROVIDER_SITE_OTHER): Payer: Medicare Other | Admitting: Cardiovascular Disease

## 2014-07-14 ENCOUNTER — Encounter: Payer: Self-pay | Admitting: Cardiovascular Disease

## 2014-07-14 VITALS — BP 138/68 | HR 81 | Ht 64.0 in | Wt 145.0 lb

## 2014-07-14 DIAGNOSIS — E785 Hyperlipidemia, unspecified: Secondary | ICD-10-CM

## 2014-07-14 DIAGNOSIS — D508 Other iron deficiency anemias: Secondary | ICD-10-CM

## 2014-07-14 DIAGNOSIS — Z9289 Personal history of other medical treatment: Secondary | ICD-10-CM

## 2014-07-14 DIAGNOSIS — J438 Other emphysema: Secondary | ICD-10-CM | POA: Diagnosis not present

## 2014-07-14 DIAGNOSIS — Z87898 Personal history of other specified conditions: Secondary | ICD-10-CM

## 2014-07-14 DIAGNOSIS — I10 Essential (primary) hypertension: Secondary | ICD-10-CM | POA: Diagnosis not present

## 2014-07-14 DIAGNOSIS — S82852S Displaced trimalleolar fracture of left lower leg, sequela: Secondary | ICD-10-CM | POA: Diagnosis not present

## 2014-07-14 DIAGNOSIS — I4891 Unspecified atrial fibrillation: Secondary | ICD-10-CM

## 2014-07-14 MED ORDER — METOPROLOL TARTRATE 25 MG PO TABS: 12.5000 mg | ORAL_TABLET | Freq: Two times a day (BID) | ORAL | Status: DC

## 2014-07-14 NOTE — Progress Notes (Signed)
Patient ID: Amanda Palmer, female   DOB: 10/14/1933, 81 y.o.   MRN: [id number]      SUBJECTIVE: The patient returns for follow up of arrhythmia and CV testing. She wore an event monitor which demonstrated sinus rhythm with trigeminal PACs as well as rapid atrial fibrillation, heart rate up to 170 bpm. She was recently in the hospital for a COPD exacerbation in March. She also had an episode of syncope related to orthostasis. When she is in rapid atrial fibrillation, she feels "jittery and weak".  Echocardiogram on 3/23 demonstrated normal left ventricular systolic function, EF 12-45%, normal regional wall motion, grade 1 diastolic dysfunction, and mild left atrial dilatation.  She said she recently had an EGD with Dr. Britta Mccreedy and no abnormalities were found. She believes her most recent colonoscopy was 5 or 6 years ago. Hemoglobin was 8.3 on 4/11. She denies hematochezia and melena. She is taking supplemental iron.  Review of Systems: As per "subjective", otherwise negative.  Allergies  Allergen Reactions  . Morphine And Related Nausea And Vomiting  . Sulfa Antibiotics Nausea And Vomiting  . Penicillins Rash  . Prednisone Nausea And Vomiting, Anxiety and Other (See Comments)    Makes patient not in right state of mind    Current Outpatient Prescriptions  Medication Sig Dispense Refill  . albuterol (PROVENTIL HFA;VENTOLIN HFA) 108 (90 BASE) MCG/ACT inhaler Inhale 1-2 puffs into the lungs every 6 (six) hours as needed for wheezing or shortness of breath.    Marland Kitchen albuterol (PROVENTIL) (2.5 MG/3ML) 0.083% nebulizer solution Take 2.5 mg by nebulization every 6 (six) hours as needed for wheezing or shortness of breath.    Marland Kitchen arformoterol (BROVANA) 15 MCG/2ML NEBU Take 15 mcg by nebulization 2 (two) times daily.     Marland Kitchen aspirin 325 MG tablet Take 325 mg by mouth daily.    Marland Kitchen atorvastatin (LIPITOR) 20 MG tablet Take 20 mg by mouth every evening.     . budesonide (PULMICORT) 0.25 MG/2ML nebulizer  solution Take 0.25 mg by nebulization 2 (two) times daily.     . Calcium Carb-Cholecalciferol (CALCIUM + D3) 600-200 MG-UNIT TABS Take 1 tablet by mouth 2 (two) times daily.     . clonazePAM (KLONOPIN) 1 MG tablet Take 1 tablet (1 mg total) by mouth at bedtime. 30 tablet 0  . ferrous sulfate 325 (65 FE) MG tablet Take 325 mg by mouth 2 (two) times daily with a meal.    . gabapentin (NEURONTIN) 300 MG capsule Take 300 mg by mouth at bedtime.     Marland Kitchen HYDROcodone-acetaminophen (NORCO/VICODIN) 5-325 MG per tablet Take 1 tablet by mouth every 6 (six) hours as needed for moderate pain. 120 tablet 0  . montelukast (SINGULAIR) 10 MG tablet Take 10 mg by mouth daily.    . Multiple Vitamin (MULTIVITAMIN WITH MINERALS) TABS tablet Take 1 tablet by mouth daily.    . nitroGLYCERIN (NITROSTAT) 0.4 MG SL tablet Place 0.4 mg under the tongue every 5 (five) minutes as needed for chest pain.     Marland Kitchen omeprazole (PRILOSEC) 40 MG capsule Take 40 mg by mouth daily.    Marland Kitchen venlafaxine (EFFEXOR) 75 MG tablet Take 75 mg by mouth 3 (three) times daily with meals.      No current facility-administered medications for this visit.    Past Medical History  Diagnosis Date  . Cancer     breast  . Hypertension   . COPD (chronic obstructive pulmonary disease)     Past Surgical History  Procedure Laterality Date  . Cardiac catheterization    . Total knee arthroplasty      bilateral  . Total hip arthroplasty      right  . Rotator cuff repair      left  . Mastectomy      right   . Orif ankle fracture Left 06/01/2014    Procedure: OPEN REDUCTION INTERNAL FIXATION (ORIF) LEFT ANKLE FRACTURE;  Surgeon: Marybelle Killings, MD;  Location: [facility];  Service: Orthopedics;  Laterality: Left;    History   Social History  . Marital Status: Widowed    Spouse Name: N/A  . Number of Children: N/A  . Years of Education: N/A   Occupational History  . Not on file.   Social History Main Topics  . Smoking status: Never Smoker   .  Smokeless tobacco: Never Used  . Alcohol Use: No  . Drug Use: No  . Sexual Activity: Not on file   Other Topics Concern  . Not on file   Social History Narrative     Filed Vitals:   07/14/14 1431  BP: 138/68  Pulse: 81  Height: 5\' 4"  (1.626 m)  Weight: 145 lb (65.772 kg)  SpO2: 99%    PHYSICAL EXAM General: NAD Neck: No JVD, no thyromegaly or thyroid nodule.  Lungs: Clear to auscultation bilaterally with normal respiratory effort. CV: Nondisplaced PMI. Regular rate and mostly regular rhythm with premature contractions appreciated in a trigeminal fashion, normal S1/S2, no S3/S4, soft 1/6 holosystolic murmur along the left lower sternal border.Left leg in boot. Abdomen: Soft, nontender,no distention.  Skin: Intact without lesions or rashes.  Neurologic: Alert and oriented x 3.  Psych: Normal affect. Extremities: No clubbing or cyanosis.  HEENT: Normal.   ECG: Most recent ECG reviewed.    ASSESSMENT AND PLAN: 1. Atrial fibrillation: Given episodes of tachycardia, will start low-dose metoprolol 12.5 mg twice daily. CHADSVASC 6 thus high thromboembolic risk. However, given low Hgb will need to rule out lower GI bleed. I have asked her to contact Dr. Britta Mccreedy about having a colonoscopy. She would like to wait until after 4 weeks when she will be out of the boot. If colonoscopy is normal, would then start apixaban 5 mg bid. For now, continue ASA 325 mg.  2. Essential HTN: Well controlled. Will monitor given addition of metoprolol given h/o orthostasis and syncope.  3. Hyperlipidemia: Continue Lipitor 20 mg.  4. Anemia: Hgb 8.3 on 4/11. Encouraged to take ferrous sulfate. Will need to rule out lower GI bleed. She reports a recently normal EGD. I have asked her to contact Dr. Britta Mccreedy about having a colonoscopy. She would like to wait until after 4 weeks when she will be out of the boot.   Dispo: f/u 8-10 weeks (after colonoscopy).  Time spent: 40 minutes, of which greater  than 50% was spent reviewing symptoms, relevant blood tests and studies, and discussing management plan with the patient.   Kate Sable, M.D., F.A.C.C.

## 2014-07-14 NOTE — Patient Instructions (Signed)
   Begin Metoprolol tart 12.5mg  twice a day  - will have to take 1/2 tab of a 25mg  tablet as this medication is not available in 12.5mg  tablet.  New sent to pharmacy today.  Continue all other medications.   Colonoscopy - will need this in about 6 weeks.  Please contact Dr. Britta Mccreedy regarding this since you are already a patient of his.  Our office will send copy of note from today's visit to him.   Continue your Aspirin and supplemental Iron as currently taking.   Continue all other medications.   Follow up in  8-10 weeks (if your colonoscopy has been done).  If not, please call the office & we will reschedule this appointment for you.

## 2014-07-15 DIAGNOSIS — Z5181 Encounter for therapeutic drug level monitoring: Secondary | ICD-10-CM | POA: Diagnosis not present

## 2014-07-15 DIAGNOSIS — F329 Major depressive disorder, single episode, unspecified: Secondary | ICD-10-CM | POA: Diagnosis not present

## 2014-07-15 DIAGNOSIS — J449 Chronic obstructive pulmonary disease, unspecified: Secondary | ICD-10-CM | POA: Diagnosis not present

## 2014-07-15 DIAGNOSIS — S82852D Displaced trimalleolar fracture of left lower leg, subsequent encounter for closed fracture with routine healing: Secondary | ICD-10-CM | POA: Diagnosis not present

## 2014-07-15 DIAGNOSIS — I10 Essential (primary) hypertension: Secondary | ICD-10-CM | POA: Diagnosis not present

## 2014-07-18 DIAGNOSIS — S82852D Displaced trimalleolar fracture of left lower leg, subsequent encounter for closed fracture with routine healing: Secondary | ICD-10-CM | POA: Diagnosis not present

## 2014-07-18 DIAGNOSIS — I10 Essential (primary) hypertension: Secondary | ICD-10-CM | POA: Diagnosis not present

## 2014-07-18 DIAGNOSIS — F329 Major depressive disorder, single episode, unspecified: Secondary | ICD-10-CM | POA: Diagnosis not present

## 2014-07-18 DIAGNOSIS — D509 Iron deficiency anemia, unspecified: Secondary | ICD-10-CM | POA: Diagnosis not present

## 2014-07-18 DIAGNOSIS — Z5181 Encounter for therapeutic drug level monitoring: Secondary | ICD-10-CM | POA: Diagnosis not present

## 2014-07-18 DIAGNOSIS — J449 Chronic obstructive pulmonary disease, unspecified: Secondary | ICD-10-CM | POA: Diagnosis not present

## 2014-07-18 DIAGNOSIS — I480 Paroxysmal atrial fibrillation: Secondary | ICD-10-CM | POA: Diagnosis not present

## 2014-07-18 DIAGNOSIS — R911 Solitary pulmonary nodule: Secondary | ICD-10-CM | POA: Diagnosis not present

## 2014-07-20 DIAGNOSIS — F329 Major depressive disorder, single episode, unspecified: Secondary | ICD-10-CM | POA: Diagnosis not present

## 2014-07-20 DIAGNOSIS — Z5181 Encounter for therapeutic drug level monitoring: Secondary | ICD-10-CM | POA: Diagnosis not present

## 2014-07-20 DIAGNOSIS — J449 Chronic obstructive pulmonary disease, unspecified: Secondary | ICD-10-CM | POA: Diagnosis not present

## 2014-07-20 DIAGNOSIS — S82852D Displaced trimalleolar fracture of left lower leg, subsequent encounter for closed fracture with routine healing: Secondary | ICD-10-CM | POA: Diagnosis not present

## 2014-07-20 DIAGNOSIS — I10 Essential (primary) hypertension: Secondary | ICD-10-CM | POA: Diagnosis not present

## 2014-07-21 ENCOUNTER — Other Ambulatory Visit: Payer: Self-pay | Admitting: *Deleted

## 2014-07-21 ENCOUNTER — Ambulatory Visit (INDEPENDENT_AMBULATORY_CARE_PROVIDER_SITE_OTHER): Payer: Medicare Other

## 2014-07-21 DIAGNOSIS — I499 Cardiac arrhythmia, unspecified: Secondary | ICD-10-CM | POA: Diagnosis not present

## 2014-07-21 DIAGNOSIS — I10 Essential (primary) hypertension: Secondary | ICD-10-CM | POA: Diagnosis not present

## 2014-07-21 DIAGNOSIS — S82852D Displaced trimalleolar fracture of left lower leg, subsequent encounter for closed fracture with routine healing: Secondary | ICD-10-CM | POA: Diagnosis not present

## 2014-07-21 DIAGNOSIS — Z5181 Encounter for therapeutic drug level monitoring: Secondary | ICD-10-CM | POA: Diagnosis not present

## 2014-07-21 DIAGNOSIS — F329 Major depressive disorder, single episode, unspecified: Secondary | ICD-10-CM | POA: Diagnosis not present

## 2014-07-21 DIAGNOSIS — J449 Chronic obstructive pulmonary disease, unspecified: Secondary | ICD-10-CM | POA: Diagnosis not present

## 2014-07-25 ENCOUNTER — Telehealth: Payer: Self-pay | Admitting: Cardiovascular Disease

## 2014-07-25 DIAGNOSIS — Z5181 Encounter for therapeutic drug level monitoring: Secondary | ICD-10-CM | POA: Diagnosis not present

## 2014-07-25 DIAGNOSIS — F329 Major depressive disorder, single episode, unspecified: Secondary | ICD-10-CM | POA: Diagnosis not present

## 2014-07-25 DIAGNOSIS — J449 Chronic obstructive pulmonary disease, unspecified: Secondary | ICD-10-CM | POA: Diagnosis not present

## 2014-07-25 DIAGNOSIS — I10 Essential (primary) hypertension: Secondary | ICD-10-CM | POA: Diagnosis not present

## 2014-07-25 DIAGNOSIS — S82852D Displaced trimalleolar fracture of left lower leg, subsequent encounter for closed fracture with routine healing: Secondary | ICD-10-CM | POA: Diagnosis not present

## 2014-07-25 NOTE — Telephone Encounter (Signed)
Patient has been experiencing SOB & fatigue and not chest pain but tighten, but has not worsened through the day. 110/70

## 2014-07-25 NOTE — Telephone Encounter (Signed)
Patient said she got up this morning feeling SOB and fatigue. Patient said she used her nebulizer treatments and she felt a lot better. No c/o chest pain, or dizziness. Patient advised to use her nebulizer the next time she feels SOB and if she didn't get improvement to let a provider know. Patient verbalized understanding of plan.

## 2014-07-26 ENCOUNTER — Telehealth: Payer: Self-pay | Admitting: Cardiovascular Disease

## 2014-07-26 DIAGNOSIS — J449 Chronic obstructive pulmonary disease, unspecified: Secondary | ICD-10-CM | POA: Diagnosis not present

## 2014-07-26 DIAGNOSIS — F329 Major depressive disorder, single episode, unspecified: Secondary | ICD-10-CM | POA: Diagnosis not present

## 2014-07-26 DIAGNOSIS — S82852D Displaced trimalleolar fracture of left lower leg, subsequent encounter for closed fracture with routine healing: Secondary | ICD-10-CM | POA: Diagnosis not present

## 2014-07-26 DIAGNOSIS — Z5181 Encounter for therapeutic drug level monitoring: Secondary | ICD-10-CM | POA: Diagnosis not present

## 2014-07-26 DIAGNOSIS — I10 Essential (primary) hypertension: Secondary | ICD-10-CM | POA: Diagnosis not present

## 2014-07-26 NOTE — Telephone Encounter (Signed)
Patient states that she is returning a call in reference to her having a colonoscopy. Patient states that she had a bad spell 07-25-14 with shortness Of breath. Marland Kitchen

## 2014-07-27 DIAGNOSIS — Z5181 Encounter for therapeutic drug level monitoring: Secondary | ICD-10-CM | POA: Diagnosis not present

## 2014-07-27 DIAGNOSIS — S82852D Displaced trimalleolar fracture of left lower leg, subsequent encounter for closed fracture with routine healing: Secondary | ICD-10-CM | POA: Diagnosis not present

## 2014-07-27 DIAGNOSIS — F329 Major depressive disorder, single episode, unspecified: Secondary | ICD-10-CM | POA: Diagnosis not present

## 2014-07-27 DIAGNOSIS — I10 Essential (primary) hypertension: Secondary | ICD-10-CM | POA: Diagnosis not present

## 2014-07-27 DIAGNOSIS — J449 Chronic obstructive pulmonary disease, unspecified: Secondary | ICD-10-CM | POA: Diagnosis not present

## 2014-07-27 NOTE — Telephone Encounter (Signed)
Discussed below with patient.  Stated she had questions about her irregular heartbeat.  Stated her therapist & nurse come to see her & told her this.  Explained to patient the she does have know atrial fibrillation so it will possibly feel this way if she is in the rhythm all the time.  Patient states that her SOB did get better with her nebulizer treatments.  Also, informed her that good heart rate control is very important for her.  At last visit, Dr. Bronson Ing started patient on Metoprolol 12.5mg  twice a day given her episodes of tachycardia.  Patient was not sure if she needed to come back before being put on anticoagulant.  Reviewed Dr. Bronson Ing office note dictation & informed patient that MD wants her to see GI first to make sure she doesn't have any lower GI bleeding.  Once she has colonoscopy to confirm this, Dr. Bronson Ing will readdress starting the Eliquis or not.  Patient stated that she has scheduled OV with Dr. Britta Mccreedy for 08/07/2014.  Follow up with Dr. Raliegh Ip is scheduled for 09/09/2014.  Nurse also reminded patient that she may have rapid heart rate and/or shaky feeling after those nebulizer treatments, especially if she is using Albuterol.  Advised to keep log of BP & HR readings and notify office with any further concerns.   Patient verbalized understanding.

## 2014-07-29 DIAGNOSIS — J449 Chronic obstructive pulmonary disease, unspecified: Secondary | ICD-10-CM | POA: Diagnosis not present

## 2014-07-29 DIAGNOSIS — S82852D Displaced trimalleolar fracture of left lower leg, subsequent encounter for closed fracture with routine healing: Secondary | ICD-10-CM | POA: Diagnosis not present

## 2014-07-29 DIAGNOSIS — I10 Essential (primary) hypertension: Secondary | ICD-10-CM | POA: Diagnosis not present

## 2014-07-29 DIAGNOSIS — F329 Major depressive disorder, single episode, unspecified: Secondary | ICD-10-CM | POA: Diagnosis not present

## 2014-07-29 DIAGNOSIS — Z5181 Encounter for therapeutic drug level monitoring: Secondary | ICD-10-CM | POA: Diagnosis not present

## 2014-08-01 DIAGNOSIS — F329 Major depressive disorder, single episode, unspecified: Secondary | ICD-10-CM | POA: Diagnosis not present

## 2014-08-01 DIAGNOSIS — J449 Chronic obstructive pulmonary disease, unspecified: Secondary | ICD-10-CM | POA: Diagnosis not present

## 2014-08-01 DIAGNOSIS — S82852D Displaced trimalleolar fracture of left lower leg, subsequent encounter for closed fracture with routine healing: Secondary | ICD-10-CM | POA: Diagnosis not present

## 2014-08-01 DIAGNOSIS — Z5181 Encounter for therapeutic drug level monitoring: Secondary | ICD-10-CM | POA: Diagnosis not present

## 2014-08-01 DIAGNOSIS — I10 Essential (primary) hypertension: Secondary | ICD-10-CM | POA: Diagnosis not present

## 2014-08-03 DIAGNOSIS — I10 Essential (primary) hypertension: Secondary | ICD-10-CM | POA: Diagnosis not present

## 2014-08-03 DIAGNOSIS — S82852D Displaced trimalleolar fracture of left lower leg, subsequent encounter for closed fracture with routine healing: Secondary | ICD-10-CM | POA: Diagnosis not present

## 2014-08-03 DIAGNOSIS — F329 Major depressive disorder, single episode, unspecified: Secondary | ICD-10-CM | POA: Diagnosis not present

## 2014-08-03 DIAGNOSIS — Z5181 Encounter for therapeutic drug level monitoring: Secondary | ICD-10-CM | POA: Diagnosis not present

## 2014-08-03 DIAGNOSIS — J449 Chronic obstructive pulmonary disease, unspecified: Secondary | ICD-10-CM | POA: Diagnosis not present

## 2014-08-04 DIAGNOSIS — D649 Anemia, unspecified: Secondary | ICD-10-CM | POA: Diagnosis not present

## 2014-08-05 DIAGNOSIS — E782 Mixed hyperlipidemia: Secondary | ICD-10-CM | POA: Diagnosis not present

## 2014-08-05 DIAGNOSIS — I10 Essential (primary) hypertension: Secondary | ICD-10-CM | POA: Diagnosis not present

## 2014-08-05 DIAGNOSIS — M791 Myalgia: Secondary | ICD-10-CM | POA: Diagnosis not present

## 2014-08-05 DIAGNOSIS — R531 Weakness: Secondary | ICD-10-CM | POA: Diagnosis not present

## 2014-08-08 DIAGNOSIS — Z5181 Encounter for therapeutic drug level monitoring: Secondary | ICD-10-CM | POA: Diagnosis not present

## 2014-08-08 DIAGNOSIS — F329 Major depressive disorder, single episode, unspecified: Secondary | ICD-10-CM | POA: Diagnosis not present

## 2014-08-08 DIAGNOSIS — S82852D Displaced trimalleolar fracture of left lower leg, subsequent encounter for closed fracture with routine healing: Secondary | ICD-10-CM | POA: Diagnosis not present

## 2014-08-08 DIAGNOSIS — I10 Essential (primary) hypertension: Secondary | ICD-10-CM | POA: Diagnosis not present

## 2014-08-08 DIAGNOSIS — J449 Chronic obstructive pulmonary disease, unspecified: Secondary | ICD-10-CM | POA: Diagnosis not present

## 2014-08-09 DIAGNOSIS — I10 Essential (primary) hypertension: Secondary | ICD-10-CM | POA: Diagnosis not present

## 2014-08-09 DIAGNOSIS — S82852D Displaced trimalleolar fracture of left lower leg, subsequent encounter for closed fracture with routine healing: Secondary | ICD-10-CM | POA: Diagnosis not present

## 2014-08-09 DIAGNOSIS — J449 Chronic obstructive pulmonary disease, unspecified: Secondary | ICD-10-CM | POA: Diagnosis not present

## 2014-08-09 DIAGNOSIS — F329 Major depressive disorder, single episode, unspecified: Secondary | ICD-10-CM | POA: Diagnosis not present

## 2014-08-09 DIAGNOSIS — Z5181 Encounter for therapeutic drug level monitoring: Secondary | ICD-10-CM | POA: Diagnosis not present

## 2014-08-10 DIAGNOSIS — S82852D Displaced trimalleolar fracture of left lower leg, subsequent encounter for closed fracture with routine healing: Secondary | ICD-10-CM | POA: Diagnosis not present

## 2014-08-10 DIAGNOSIS — F329 Major depressive disorder, single episode, unspecified: Secondary | ICD-10-CM | POA: Diagnosis not present

## 2014-08-10 DIAGNOSIS — Z5181 Encounter for therapeutic drug level monitoring: Secondary | ICD-10-CM | POA: Diagnosis not present

## 2014-08-10 DIAGNOSIS — J449 Chronic obstructive pulmonary disease, unspecified: Secondary | ICD-10-CM | POA: Diagnosis not present

## 2014-08-10 DIAGNOSIS — I10 Essential (primary) hypertension: Secondary | ICD-10-CM | POA: Diagnosis not present

## 2014-08-10 HISTORY — PX: COLONOSCOPY: SHX174

## 2014-08-11 DIAGNOSIS — F329 Major depressive disorder, single episode, unspecified: Secondary | ICD-10-CM | POA: Diagnosis not present

## 2014-08-11 DIAGNOSIS — J449 Chronic obstructive pulmonary disease, unspecified: Secondary | ICD-10-CM | POA: Diagnosis not present

## 2014-08-11 DIAGNOSIS — Z5181 Encounter for therapeutic drug level monitoring: Secondary | ICD-10-CM | POA: Diagnosis not present

## 2014-08-11 DIAGNOSIS — I10 Essential (primary) hypertension: Secondary | ICD-10-CM | POA: Diagnosis not present

## 2014-08-11 DIAGNOSIS — S82852D Displaced trimalleolar fracture of left lower leg, subsequent encounter for closed fracture with routine healing: Secondary | ICD-10-CM | POA: Diagnosis not present

## 2014-08-15 DIAGNOSIS — I10 Essential (primary) hypertension: Secondary | ICD-10-CM | POA: Diagnosis not present

## 2014-08-15 DIAGNOSIS — F329 Major depressive disorder, single episode, unspecified: Secondary | ICD-10-CM | POA: Diagnosis not present

## 2014-08-15 DIAGNOSIS — S82852D Displaced trimalleolar fracture of left lower leg, subsequent encounter for closed fracture with routine healing: Secondary | ICD-10-CM | POA: Diagnosis not present

## 2014-08-15 DIAGNOSIS — J449 Chronic obstructive pulmonary disease, unspecified: Secondary | ICD-10-CM | POA: Diagnosis not present

## 2014-08-15 DIAGNOSIS — Z5181 Encounter for therapeutic drug level monitoring: Secondary | ICD-10-CM | POA: Diagnosis not present

## 2014-08-16 DIAGNOSIS — D509 Iron deficiency anemia, unspecified: Secondary | ICD-10-CM | POA: Diagnosis not present

## 2014-08-16 DIAGNOSIS — J449 Chronic obstructive pulmonary disease, unspecified: Secondary | ICD-10-CM | POA: Diagnosis not present

## 2014-08-16 DIAGNOSIS — Z5181 Encounter for therapeutic drug level monitoring: Secondary | ICD-10-CM | POA: Diagnosis not present

## 2014-08-16 DIAGNOSIS — R05 Cough: Secondary | ICD-10-CM | POA: Diagnosis not present

## 2014-08-16 DIAGNOSIS — I482 Chronic atrial fibrillation: Secondary | ICD-10-CM | POA: Diagnosis not present

## 2014-08-16 DIAGNOSIS — D649 Anemia, unspecified: Secondary | ICD-10-CM | POA: Diagnosis not present

## 2014-08-16 DIAGNOSIS — S82852D Displaced trimalleolar fracture of left lower leg, subsequent encounter for closed fracture with routine healing: Secondary | ICD-10-CM | POA: Diagnosis not present

## 2014-08-16 DIAGNOSIS — F329 Major depressive disorder, single episode, unspecified: Secondary | ICD-10-CM | POA: Diagnosis not present

## 2014-08-16 DIAGNOSIS — I10 Essential (primary) hypertension: Secondary | ICD-10-CM | POA: Diagnosis not present

## 2014-08-17 DIAGNOSIS — J449 Chronic obstructive pulmonary disease, unspecified: Secondary | ICD-10-CM | POA: Diagnosis not present

## 2014-08-17 DIAGNOSIS — F329 Major depressive disorder, single episode, unspecified: Secondary | ICD-10-CM | POA: Diagnosis not present

## 2014-08-17 DIAGNOSIS — S82852D Displaced trimalleolar fracture of left lower leg, subsequent encounter for closed fracture with routine healing: Secondary | ICD-10-CM | POA: Diagnosis not present

## 2014-08-17 DIAGNOSIS — Z5181 Encounter for therapeutic drug level monitoring: Secondary | ICD-10-CM | POA: Diagnosis not present

## 2014-08-17 DIAGNOSIS — I10 Essential (primary) hypertension: Secondary | ICD-10-CM | POA: Diagnosis not present

## 2014-08-18 DIAGNOSIS — I10 Essential (primary) hypertension: Secondary | ICD-10-CM | POA: Diagnosis not present

## 2014-08-18 DIAGNOSIS — J449 Chronic obstructive pulmonary disease, unspecified: Secondary | ICD-10-CM | POA: Diagnosis not present

## 2014-08-18 DIAGNOSIS — S82852D Displaced trimalleolar fracture of left lower leg, subsequent encounter for closed fracture with routine healing: Secondary | ICD-10-CM | POA: Diagnosis not present

## 2014-08-18 DIAGNOSIS — F329 Major depressive disorder, single episode, unspecified: Secondary | ICD-10-CM | POA: Diagnosis not present

## 2014-08-18 DIAGNOSIS — Z5181 Encounter for therapeutic drug level monitoring: Secondary | ICD-10-CM | POA: Diagnosis not present

## 2014-08-22 DIAGNOSIS — J449 Chronic obstructive pulmonary disease, unspecified: Secondary | ICD-10-CM | POA: Diagnosis not present

## 2014-08-22 DIAGNOSIS — I10 Essential (primary) hypertension: Secondary | ICD-10-CM | POA: Diagnosis not present

## 2014-08-22 DIAGNOSIS — F329 Major depressive disorder, single episode, unspecified: Secondary | ICD-10-CM | POA: Diagnosis not present

## 2014-08-22 DIAGNOSIS — S82852D Displaced trimalleolar fracture of left lower leg, subsequent encounter for closed fracture with routine healing: Secondary | ICD-10-CM | POA: Diagnosis not present

## 2014-08-22 DIAGNOSIS — Z5181 Encounter for therapeutic drug level monitoring: Secondary | ICD-10-CM | POA: Diagnosis not present

## 2014-08-23 DIAGNOSIS — I10 Essential (primary) hypertension: Secondary | ICD-10-CM | POA: Diagnosis not present

## 2014-08-23 DIAGNOSIS — S82852D Displaced trimalleolar fracture of left lower leg, subsequent encounter for closed fracture with routine healing: Secondary | ICD-10-CM | POA: Diagnosis not present

## 2014-08-23 DIAGNOSIS — F329 Major depressive disorder, single episode, unspecified: Secondary | ICD-10-CM | POA: Diagnosis not present

## 2014-08-23 DIAGNOSIS — J449 Chronic obstructive pulmonary disease, unspecified: Secondary | ICD-10-CM | POA: Diagnosis not present

## 2014-08-23 DIAGNOSIS — Z5181 Encounter for therapeutic drug level monitoring: Secondary | ICD-10-CM | POA: Diagnosis not present

## 2014-08-24 DIAGNOSIS — J449 Chronic obstructive pulmonary disease, unspecified: Secondary | ICD-10-CM | POA: Diagnosis not present

## 2014-08-24 DIAGNOSIS — Z5181 Encounter for therapeutic drug level monitoring: Secondary | ICD-10-CM | POA: Diagnosis not present

## 2014-08-24 DIAGNOSIS — I10 Essential (primary) hypertension: Secondary | ICD-10-CM | POA: Diagnosis not present

## 2014-08-24 DIAGNOSIS — F329 Major depressive disorder, single episode, unspecified: Secondary | ICD-10-CM | POA: Diagnosis not present

## 2014-08-24 DIAGNOSIS — S82852D Displaced trimalleolar fracture of left lower leg, subsequent encounter for closed fracture with routine healing: Secondary | ICD-10-CM | POA: Diagnosis not present

## 2014-08-26 DIAGNOSIS — Z8489 Family history of other specified conditions: Secondary | ICD-10-CM | POA: Diagnosis not present

## 2014-08-26 DIAGNOSIS — Z882 Allergy status to sulfonamides status: Secondary | ICD-10-CM | POA: Diagnosis not present

## 2014-08-26 DIAGNOSIS — Z803 Family history of malignant neoplasm of breast: Secondary | ICD-10-CM | POA: Diagnosis not present

## 2014-08-26 DIAGNOSIS — Z8249 Family history of ischemic heart disease and other diseases of the circulatory system: Secondary | ICD-10-CM | POA: Diagnosis not present

## 2014-08-26 DIAGNOSIS — Z9011 Acquired absence of right breast and nipple: Secondary | ICD-10-CM | POA: Diagnosis not present

## 2014-08-26 DIAGNOSIS — M1990 Unspecified osteoarthritis, unspecified site: Secondary | ICD-10-CM | POA: Diagnosis not present

## 2014-08-26 DIAGNOSIS — D120 Benign neoplasm of cecum: Secondary | ICD-10-CM | POA: Diagnosis not present

## 2014-08-26 DIAGNOSIS — J449 Chronic obstructive pulmonary disease, unspecified: Secondary | ICD-10-CM | POA: Diagnosis not present

## 2014-08-26 DIAGNOSIS — D649 Anemia, unspecified: Secondary | ICD-10-CM | POA: Diagnosis not present

## 2014-08-26 DIAGNOSIS — F329 Major depressive disorder, single episode, unspecified: Secondary | ICD-10-CM | POA: Diagnosis not present

## 2014-08-26 DIAGNOSIS — Z96649 Presence of unspecified artificial hip joint: Secondary | ICD-10-CM | POA: Diagnosis not present

## 2014-08-26 DIAGNOSIS — Z96659 Presence of unspecified artificial knee joint: Secondary | ICD-10-CM | POA: Diagnosis not present

## 2014-08-26 DIAGNOSIS — Z853 Personal history of malignant neoplasm of breast: Secondary | ICD-10-CM | POA: Diagnosis not present

## 2014-08-26 DIAGNOSIS — K219 Gastro-esophageal reflux disease without esophagitis: Secondary | ICD-10-CM | POA: Diagnosis not present

## 2014-08-26 DIAGNOSIS — I10 Essential (primary) hypertension: Secondary | ICD-10-CM | POA: Diagnosis not present

## 2014-08-26 DIAGNOSIS — E785 Hyperlipidemia, unspecified: Secondary | ICD-10-CM | POA: Diagnosis not present

## 2014-08-26 DIAGNOSIS — F419 Anxiety disorder, unspecified: Secondary | ICD-10-CM | POA: Diagnosis not present

## 2014-08-26 DIAGNOSIS — M810 Age-related osteoporosis without current pathological fracture: Secondary | ICD-10-CM | POA: Diagnosis not present

## 2014-08-26 DIAGNOSIS — Z79899 Other long term (current) drug therapy: Secondary | ICD-10-CM | POA: Diagnosis not present

## 2014-08-26 DIAGNOSIS — Z880 Allergy status to penicillin: Secondary | ICD-10-CM | POA: Diagnosis not present

## 2014-08-26 DIAGNOSIS — Z806 Family history of leukemia: Secondary | ICD-10-CM | POA: Diagnosis not present

## 2014-08-26 DIAGNOSIS — Z823 Family history of stroke: Secondary | ICD-10-CM | POA: Diagnosis not present

## 2014-08-26 DIAGNOSIS — Z885 Allergy status to narcotic agent status: Secondary | ICD-10-CM | POA: Diagnosis not present

## 2014-08-29 DIAGNOSIS — J449 Chronic obstructive pulmonary disease, unspecified: Secondary | ICD-10-CM | POA: Diagnosis not present

## 2014-08-29 DIAGNOSIS — F329 Major depressive disorder, single episode, unspecified: Secondary | ICD-10-CM | POA: Diagnosis not present

## 2014-08-29 DIAGNOSIS — S82852D Displaced trimalleolar fracture of left lower leg, subsequent encounter for closed fracture with routine healing: Secondary | ICD-10-CM | POA: Diagnosis not present

## 2014-08-29 DIAGNOSIS — I10 Essential (primary) hypertension: Secondary | ICD-10-CM | POA: Diagnosis not present

## 2014-08-29 DIAGNOSIS — Z5181 Encounter for therapeutic drug level monitoring: Secondary | ICD-10-CM | POA: Diagnosis not present

## 2014-08-29 DIAGNOSIS — D120 Benign neoplasm of cecum: Secondary | ICD-10-CM | POA: Diagnosis not present

## 2014-08-31 DIAGNOSIS — Z5181 Encounter for therapeutic drug level monitoring: Secondary | ICD-10-CM | POA: Diagnosis not present

## 2014-08-31 DIAGNOSIS — I10 Essential (primary) hypertension: Secondary | ICD-10-CM | POA: Diagnosis not present

## 2014-08-31 DIAGNOSIS — F329 Major depressive disorder, single episode, unspecified: Secondary | ICD-10-CM | POA: Diagnosis not present

## 2014-08-31 DIAGNOSIS — J449 Chronic obstructive pulmonary disease, unspecified: Secondary | ICD-10-CM | POA: Diagnosis not present

## 2014-08-31 DIAGNOSIS — S82852D Displaced trimalleolar fracture of left lower leg, subsequent encounter for closed fracture with routine healing: Secondary | ICD-10-CM | POA: Diagnosis not present

## 2014-09-02 DIAGNOSIS — S82852D Displaced trimalleolar fracture of left lower leg, subsequent encounter for closed fracture with routine healing: Secondary | ICD-10-CM | POA: Diagnosis not present

## 2014-09-02 DIAGNOSIS — Z5181 Encounter for therapeutic drug level monitoring: Secondary | ICD-10-CM | POA: Diagnosis not present

## 2014-09-02 DIAGNOSIS — I10 Essential (primary) hypertension: Secondary | ICD-10-CM | POA: Diagnosis not present

## 2014-09-02 DIAGNOSIS — J449 Chronic obstructive pulmonary disease, unspecified: Secondary | ICD-10-CM | POA: Diagnosis not present

## 2014-09-02 DIAGNOSIS — F329 Major depressive disorder, single episode, unspecified: Secondary | ICD-10-CM | POA: Diagnosis not present

## 2014-09-05 DIAGNOSIS — F329 Major depressive disorder, single episode, unspecified: Secondary | ICD-10-CM | POA: Diagnosis not present

## 2014-09-05 DIAGNOSIS — Z5181 Encounter for therapeutic drug level monitoring: Secondary | ICD-10-CM | POA: Diagnosis not present

## 2014-09-05 DIAGNOSIS — S82852D Displaced trimalleolar fracture of left lower leg, subsequent encounter for closed fracture with routine healing: Secondary | ICD-10-CM | POA: Diagnosis not present

## 2014-09-05 DIAGNOSIS — I10 Essential (primary) hypertension: Secondary | ICD-10-CM | POA: Diagnosis not present

## 2014-09-05 DIAGNOSIS — J449 Chronic obstructive pulmonary disease, unspecified: Secondary | ICD-10-CM | POA: Diagnosis not present

## 2014-09-07 DIAGNOSIS — J449 Chronic obstructive pulmonary disease, unspecified: Secondary | ICD-10-CM | POA: Diagnosis not present

## 2014-09-07 DIAGNOSIS — I10 Essential (primary) hypertension: Secondary | ICD-10-CM | POA: Diagnosis not present

## 2014-09-07 DIAGNOSIS — F329 Major depressive disorder, single episode, unspecified: Secondary | ICD-10-CM | POA: Diagnosis not present

## 2014-09-07 DIAGNOSIS — Z5181 Encounter for therapeutic drug level monitoring: Secondary | ICD-10-CM | POA: Diagnosis not present

## 2014-09-07 DIAGNOSIS — S82852D Displaced trimalleolar fracture of left lower leg, subsequent encounter for closed fracture with routine healing: Secondary | ICD-10-CM | POA: Diagnosis not present

## 2014-09-09 ENCOUNTER — Ambulatory Visit (INDEPENDENT_AMBULATORY_CARE_PROVIDER_SITE_OTHER): Payer: Medicare Other | Admitting: Cardiovascular Disease

## 2014-09-09 ENCOUNTER — Encounter: Payer: Self-pay | Admitting: Cardiovascular Disease

## 2014-09-09 VITALS — BP 140/82 | HR 70 | Ht 64.0 in | Wt 150.0 lb

## 2014-09-09 DIAGNOSIS — E785 Hyperlipidemia, unspecified: Secondary | ICD-10-CM | POA: Diagnosis not present

## 2014-09-09 DIAGNOSIS — I4891 Unspecified atrial fibrillation: Secondary | ICD-10-CM | POA: Diagnosis not present

## 2014-09-09 DIAGNOSIS — D508 Other iron deficiency anemias: Secondary | ICD-10-CM | POA: Diagnosis not present

## 2014-09-09 DIAGNOSIS — I10 Essential (primary) hypertension: Secondary | ICD-10-CM

## 2014-09-09 NOTE — Addendum Note (Signed)
Addended by: Laurine Blazer on: 09/09/2014 02:56 PM   Modules accepted: Level of Service

## 2014-09-09 NOTE — Patient Instructions (Addendum)
Continue all current medications. Will call  with further instructions after MD reviews lab & colonoscopy report. Follow up in  3 months

## 2014-09-09 NOTE — Progress Notes (Signed)
Patient ID: Amanda Palmer, female   DOB: August 10, 1933, 81 y.o.   MRN: [id number]      SUBJECTIVE: The patient returns for follow-up of atrial fibrillation.  Echocardiogram on 3/23 demonstrated normal left ventricular systolic function, EF 18-84%, normal regional wall motion, grade 1 diastolic dysfunction, and mild left atrial dilatation.  She has been less symptomatic with regards to palpitations since starting low-dose metoprolol 12.5 mg twice daily. While I do not have a copy of her colonoscopy report, she tells me one polyp was found and removed.  She has had one or two episodes of palpitations associated with dizziness since her last visit with me. A CBC was reportedly performed by her PCP but I do not have these results either.   Review of Systems: As per "subjective", otherwise negative.  Allergies  Allergen Reactions  . Morphine And Related Nausea And Vomiting  . Sulfa Antibiotics Nausea And Vomiting  . Penicillins Rash  . Prednisone Nausea And Vomiting, Anxiety and Other (See Comments)    Makes patient not in right state of mind    Current Outpatient Prescriptions  Medication Sig Dispense Refill  . albuterol (PROVENTIL HFA;VENTOLIN HFA) 108 (90 BASE) MCG/ACT inhaler Inhale 1-2 puffs into the lungs every 6 (six) hours as needed for wheezing or shortness of breath.    Marland Kitchen albuterol (PROVENTIL) (2.5 MG/3ML) 0.083% nebulizer solution Take 2.5 mg by nebulization every 6 (six) hours as needed for wheezing or shortness of breath.    Marland Kitchen arformoterol (BROVANA) 15 MCG/2ML NEBU Take 15 mcg by nebulization 2 (two) times daily.     Marland Kitchen aspirin 325 MG tablet Take 325 mg by mouth daily.    Marland Kitchen atorvastatin (LIPITOR) 20 MG tablet Take 20 mg by mouth every evening.     . budesonide (PULMICORT) 0.25 MG/2ML nebulizer solution Take 0.25 mg by nebulization 2 (two) times daily.     . Calcium Carb-Cholecalciferol (CALCIUM + D3) 600-200 MG-UNIT TABS Take 1 tablet by mouth 2 (two) times daily.     .  clonazePAM (KLONOPIN) 1 MG tablet Take 1 tablet (1 mg total) by mouth at bedtime. 30 tablet 0  . ferrous sulfate 325 (65 FE) MG tablet Take 325 mg by mouth 2 (two) times daily with a meal.    . gabapentin (NEURONTIN) 300 MG capsule Take 300 mg by mouth at bedtime.     Marland Kitchen HYDROcodone-acetaminophen (NORCO/VICODIN) 5-325 MG per tablet Take 1 tablet by mouth every 6 (six) hours as needed for moderate pain. 120 tablet 0  . metoprolol tartrate (LOPRESSOR) 25 MG tablet Take 0.5 tablets (12.5 mg total) by mouth 2 (two) times daily. 30 tablet 6  . montelukast (SINGULAIR) 10 MG tablet Take 10 mg by mouth daily.    . Multiple Vitamin (MULTIVITAMIN WITH MINERALS) TABS tablet Take 1 tablet by mouth daily.    . nitroGLYCERIN (NITROSTAT) 0.4 MG SL tablet Place 0.4 mg under the tongue every 5 (five) minutes as needed for chest pain.     Marland Kitchen omeprazole (PRILOSEC) 40 MG capsule Take 40 mg by mouth daily.    Marland Kitchen venlafaxine (EFFEXOR) 75 MG tablet Take 75 mg by mouth 3 (three) times daily with meals.      No current facility-administered medications for this visit.    Past Medical History  Diagnosis Date  . Cancer     breast  . Hypertension   . COPD (chronic obstructive pulmonary disease)     Past Surgical History  Procedure Laterality Date  . Cardiac  catheterization    . Total knee arthroplasty      bilateral  . Total hip arthroplasty      right  . Rotator cuff repair      left  . Mastectomy      right   . Orif ankle fracture Left 06/01/2014    Procedure: OPEN REDUCTION INTERNAL FIXATION (ORIF) LEFT ANKLE FRACTURE;  Surgeon: Marybelle Killings, MD;  Location: [facility];  Service: Orthopedics;  Laterality: Left;    History   Social History  . Marital Status: Widowed    Spouse Name: N/A  . Number of Children: N/A  . Years of Education: N/A   Occupational History  . Not on file.   Social History Main Topics  . Smoking status: Never Smoker   . Smokeless tobacco: Never Used  . Alcohol Use: No  . Drug  Use: No  . Sexual Activity: Not on file   Other Topics Concern  . Not on file   Social History Narrative     Filed Vitals:   09/09/14 1430  BP: 140/82  Pulse: 70  Height: 5\' 4"  (1.626 m)  Weight: 150 lb (68.04 kg)  SpO2: 97%    PHYSICAL EXAM General: NAD Neck: No JVD, no thyromegaly or thyroid nodule.  Lungs: Clear to auscultation bilaterally with normal respiratory effort. CV: Nondisplaced PMI. Regular rate and mostly regular rhythm with premature contractions appreciated, normal S1/S2, no S3/S4, soft 1/6 holosystolic murmur along the left lower sternal border. No pedal edema. Abdomen: Soft, nontender,no distention.  Skin: Intact without lesions or rashes.  Neurologic: Alert and oriented x 3.  Psych: Normal affect. Extremities: No clubbing or cyanosis.  HEENT: Normal.   ECG: Most recent ECG reviewed.      ASSESSMENT AND PLAN: 1. Atrial fibrillation: Continue low-dose metoprolol 12.5 mg twice daily. CHADSVASC 6 thus high thromboembolic risk. If Hgb stable, will start apixaban 5 mg bid and d/c ASA. For now, continue ASA 325 mg.  2. Essential HTN: Well controlled. No changes.  3. Hyperlipidemia: Continue Lipitor 20 mg.  4. Anemia: Hgb 8.3 on 4/11. Await result of repeat CBC.  Dispo: f/u 3 months.   Kate Sable, M.D., F.A.C.C.

## 2014-09-13 DIAGNOSIS — F329 Major depressive disorder, single episode, unspecified: Secondary | ICD-10-CM | POA: Diagnosis not present

## 2014-09-13 DIAGNOSIS — S82852D Displaced trimalleolar fracture of left lower leg, subsequent encounter for closed fracture with routine healing: Secondary | ICD-10-CM | POA: Diagnosis not present

## 2014-09-13 DIAGNOSIS — I10 Essential (primary) hypertension: Secondary | ICD-10-CM | POA: Diagnosis not present

## 2014-09-13 DIAGNOSIS — Z5181 Encounter for therapeutic drug level monitoring: Secondary | ICD-10-CM | POA: Diagnosis not present

## 2014-09-13 DIAGNOSIS — J449 Chronic obstructive pulmonary disease, unspecified: Secondary | ICD-10-CM | POA: Diagnosis not present

## 2014-09-15 DIAGNOSIS — F329 Major depressive disorder, single episode, unspecified: Secondary | ICD-10-CM | POA: Diagnosis not present

## 2014-09-15 DIAGNOSIS — I10 Essential (primary) hypertension: Secondary | ICD-10-CM | POA: Diagnosis not present

## 2014-09-15 DIAGNOSIS — S82852D Displaced trimalleolar fracture of left lower leg, subsequent encounter for closed fracture with routine healing: Secondary | ICD-10-CM | POA: Diagnosis not present

## 2014-09-15 DIAGNOSIS — J449 Chronic obstructive pulmonary disease, unspecified: Secondary | ICD-10-CM | POA: Diagnosis not present

## 2014-09-15 DIAGNOSIS — S82852S Displaced trimalleolar fracture of left lower leg, sequela: Secondary | ICD-10-CM | POA: Diagnosis not present

## 2014-09-15 DIAGNOSIS — Z5181 Encounter for therapeutic drug level monitoring: Secondary | ICD-10-CM | POA: Diagnosis not present

## 2014-09-19 DIAGNOSIS — F329 Major depressive disorder, single episode, unspecified: Secondary | ICD-10-CM | POA: Diagnosis not present

## 2014-09-19 DIAGNOSIS — S82852D Displaced trimalleolar fracture of left lower leg, subsequent encounter for closed fracture with routine healing: Secondary | ICD-10-CM | POA: Diagnosis not present

## 2014-09-19 DIAGNOSIS — Z5181 Encounter for therapeutic drug level monitoring: Secondary | ICD-10-CM | POA: Diagnosis not present

## 2014-09-19 DIAGNOSIS — J449 Chronic obstructive pulmonary disease, unspecified: Secondary | ICD-10-CM | POA: Diagnosis not present

## 2014-09-19 DIAGNOSIS — I10 Essential (primary) hypertension: Secondary | ICD-10-CM | POA: Diagnosis not present

## 2014-09-20 ENCOUNTER — Telehealth: Payer: Self-pay | Admitting: *Deleted

## 2014-09-20 DIAGNOSIS — D649 Anemia, unspecified: Secondary | ICD-10-CM

## 2014-09-20 DIAGNOSIS — Z7901 Long term (current) use of anticoagulants: Secondary | ICD-10-CM

## 2014-09-20 NOTE — Telephone Encounter (Signed)
D/c ASA. Start Eliquis 5 mg bid. Repeat CBC one week after starting.

## 2014-09-20 NOTE — Telephone Encounter (Signed)
Inquiring about beginning new anticoagulant.  Please review recent CBC results (Hgb 10.7) & advise.

## 2014-09-21 DIAGNOSIS — F329 Major depressive disorder, single episode, unspecified: Secondary | ICD-10-CM | POA: Diagnosis not present

## 2014-09-21 DIAGNOSIS — J449 Chronic obstructive pulmonary disease, unspecified: Secondary | ICD-10-CM | POA: Diagnosis not present

## 2014-09-21 DIAGNOSIS — Z5181 Encounter for therapeutic drug level monitoring: Secondary | ICD-10-CM | POA: Diagnosis not present

## 2014-09-21 DIAGNOSIS — I10 Essential (primary) hypertension: Secondary | ICD-10-CM | POA: Diagnosis not present

## 2014-09-21 DIAGNOSIS — S82852D Displaced trimalleolar fracture of left lower leg, subsequent encounter for closed fracture with routine healing: Secondary | ICD-10-CM | POA: Diagnosis not present

## 2014-09-21 NOTE — Telephone Encounter (Signed)
Patient called asking what the status is in reference to starting Eliquis

## 2014-09-27 DIAGNOSIS — M5441 Lumbago with sciatica, right side: Secondary | ICD-10-CM | POA: Diagnosis not present

## 2014-09-27 DIAGNOSIS — M5136 Other intervertebral disc degeneration, lumbar region: Secondary | ICD-10-CM | POA: Diagnosis not present

## 2014-09-27 NOTE — Telephone Encounter (Signed)
Left message to return call 

## 2014-09-29 ENCOUNTER — Encounter: Payer: Self-pay | Admitting: *Deleted

## 2014-09-29 MED ORDER — APIXABAN 5 MG PO TABS: 5.0000 mg | ORAL_TABLET | Freq: Two times a day (BID) | ORAL | Status: DC

## 2014-09-29 NOTE — Telephone Encounter (Signed)
Patient will also need 1 month follow up with Lattie Haw for new anticoagulant management.

## 2014-09-29 NOTE — Telephone Encounter (Signed)
Patient notified.  She will pick up scripts, free 30-day trial offer card, & samples today.  Lab order also given today for CBC due in 7 days after starting new medication.

## 2014-10-06 DIAGNOSIS — D649 Anemia, unspecified: Secondary | ICD-10-CM | POA: Diagnosis not present

## 2014-10-06 DIAGNOSIS — Z7901 Long term (current) use of anticoagulants: Secondary | ICD-10-CM | POA: Diagnosis not present

## 2014-10-07 DIAGNOSIS — I10 Essential (primary) hypertension: Secondary | ICD-10-CM | POA: Diagnosis not present

## 2014-10-07 DIAGNOSIS — E782 Mixed hyperlipidemia: Secondary | ICD-10-CM | POA: Diagnosis not present

## 2014-10-10 ENCOUNTER — Telehealth: Payer: Self-pay | Admitting: *Deleted

## 2014-10-10 NOTE — Telephone Encounter (Signed)
Notes Recorded by Laurine Blazer, LPN on 04/13/7626 at 31:51 AM Patient notified.

## 2014-10-10 NOTE — Telephone Encounter (Signed)
-----   Message from [place] sent at 10/10/2014  8:21 AM EDT -----   ----- Message -----    From: Herminio Commons, MD    Sent: 10/09/2014   6:41 AM      To: Massie Maroon, CMA  Normal Hgb.

## 2014-10-11 DIAGNOSIS — I482 Chronic atrial fibrillation: Secondary | ICD-10-CM | POA: Diagnosis not present

## 2014-10-11 DIAGNOSIS — E785 Hyperlipidemia, unspecified: Secondary | ICD-10-CM | POA: Diagnosis not present

## 2014-10-11 DIAGNOSIS — G3184 Mild cognitive impairment, so stated: Secondary | ICD-10-CM | POA: Diagnosis not present

## 2014-10-11 DIAGNOSIS — D649 Anemia, unspecified: Secondary | ICD-10-CM | POA: Diagnosis not present

## 2014-10-22 DIAGNOSIS — Z23 Encounter for immunization: Secondary | ICD-10-CM | POA: Diagnosis not present

## 2014-10-27 DIAGNOSIS — H6123 Impacted cerumen, bilateral: Secondary | ICD-10-CM | POA: Diagnosis not present

## 2014-11-01 ENCOUNTER — Ambulatory Visit (INDEPENDENT_AMBULATORY_CARE_PROVIDER_SITE_OTHER): Payer: Medicare Other | Admitting: *Deleted

## 2014-11-01 DIAGNOSIS — I480 Paroxysmal atrial fibrillation: Secondary | ICD-10-CM | POA: Diagnosis not present

## 2014-11-01 DIAGNOSIS — Z5181 Encounter for therapeutic drug level monitoring: Secondary | ICD-10-CM

## 2014-11-01 DIAGNOSIS — Z79899 Other long term (current) drug therapy: Secondary | ICD-10-CM | POA: Diagnosis not present

## 2014-11-01 MED ORDER — APIXABAN 5 MG PO TABS: 5.0000 mg | ORAL_TABLET | Freq: Two times a day (BID) | ORAL | Status: DC

## 2014-11-01 NOTE — Progress Notes (Signed)
Pt was started on Eliquis 5mg  bid for atrial fib on 09/20/14 by Dr Bronson Ing.    Pt denies any side effects since starting Eliquis.  She has not had any excessive bruising, bleeding or GI upset.  Reviewed patients medication list.  Pt is not currently on any combined P-gp and strong CYP3A4 inhibitors/inducers (ketoconazole, traconazole, ritonavir, carbamazepine, phenytoin, rifampin, St. John's wort).  Reviewed labs from 11/01/14 @ [facility].  SCr 0.75 , Weight 150 , CrCl 65.33.  Dose is appropriate based on 2 out of 3 criteria (age,wt,SrCr).   Hgb and HCT: 11.0/35.5  A full discussion of the nature of anticoagulants has been carried out.  A benefit/risk analysis has been presented to the patient, so that they understand the justification for choosing anticoagulation with Eliquis at this time.  The need for compliance is stressed.  Pt is aware to take the medication twice daily.  Side effects of potential bleeding are discussed, including unusual colored urine or stools, coughing up blood or coffee ground emesis, nose bleeds or serious fall or head trauma.  Discussed signs and symptoms of stroke. The patient should avoid any OTC items containing aspirin or ibuprofen.  Avoid alcohol consumption.   Call if any signs of abnormal bleeding.  Discussed financial obligations and resolved any difficulty in obtaining medication.  Next lab test test in 6 months.   Labs:  08/16/14  SrCr 0.95   Hgb 10.7    Hct 33.8            10/06/14  Hgb  11.5   Hct  37.1

## 2014-11-22 DIAGNOSIS — M19012 Primary osteoarthritis, left shoulder: Secondary | ICD-10-CM | POA: Diagnosis not present

## 2014-11-30 ENCOUNTER — Other Ambulatory Visit (HOSPITAL_COMMUNITY): Payer: Self-pay | Admitting: Respiratory Therapy

## 2014-11-30 DIAGNOSIS — R0602 Shortness of breath: Secondary | ICD-10-CM

## 2014-12-05 DIAGNOSIS — I739 Peripheral vascular disease, unspecified: Secondary | ICD-10-CM | POA: Diagnosis not present

## 2014-12-06 ENCOUNTER — Ambulatory Visit (INDEPENDENT_AMBULATORY_CARE_PROVIDER_SITE_OTHER): Payer: Medicare Other | Admitting: Cardiovascular Disease

## 2014-12-06 ENCOUNTER — Encounter: Payer: Self-pay | Admitting: Cardiovascular Disease

## 2014-12-06 VITALS — BP 162/82 | HR 69 | Ht 64.0 in | Wt 150.0 lb

## 2014-12-06 DIAGNOSIS — I10 Essential (primary) hypertension: Secondary | ICD-10-CM | POA: Diagnosis not present

## 2014-12-06 DIAGNOSIS — I4819 Other persistent atrial fibrillation: Secondary | ICD-10-CM

## 2014-12-06 DIAGNOSIS — E785 Hyperlipidemia, unspecified: Secondary | ICD-10-CM | POA: Diagnosis not present

## 2014-12-06 DIAGNOSIS — I481 Persistent atrial fibrillation: Secondary | ICD-10-CM

## 2014-12-06 MED ORDER — METOPROLOL TARTRATE 25 MG PO TABS: 25.0000 mg | ORAL_TABLET | Freq: Two times a day (BID) | ORAL | Status: DC

## 2014-12-06 NOTE — Patient Instructions (Addendum)
   Increase Metoprolol tartrate (Lopressor) to 25mg  twice a day - new sent to [facility].   Continue all other medications.   Follow up in  3-4 months

## 2014-12-06 NOTE — Progress Notes (Signed)
Patient ID: Amanda Palmer, female   DOB: 1933/08/16, 81 y.o.   MRN: [id number]      SUBJECTIVE: The patient returns for follow-up of atrial fibrillation. Echocardiogram on 06/01/14 demonstrated normal left ventricular systolic function, EF 62-95%, normal regional wall motion, grade 1 diastolic dysfunction, and mild left atrial dilatation.  About two weeks ago when visiting her sister, prior to walking up the ramp to her sister's house, she suddenly felt very weak and "could not put one foot in front of the other". She denies having chest pain and palpitations but thought she may have been a little short of breath at the time. The symptoms lasted for 2 minutes and then resolved but she felt fatigued the rest of the day. She's not had any bleeding problems with apixaban.   Review of Systems: As per "subjective", otherwise negative.  Allergies  Allergen Reactions  . Morphine And Related Nausea And Vomiting  . Sulfa Antibiotics Nausea And Vomiting  . Penicillins Rash  . Prednisone Nausea And Vomiting, Anxiety and Other (See Comments)    Makes patient not in right state of mind    Current Outpatient Prescriptions  Medication Sig Dispense Refill  . albuterol (PROVENTIL HFA;VENTOLIN HFA) 108 (90 BASE) MCG/ACT inhaler Inhale 1-2 puffs into the lungs every 6 (six) hours as needed for wheezing or shortness of breath.    Marland Kitchen albuterol (PROVENTIL) (2.5 MG/3ML) 0.083% nebulizer solution Take 2.5 mg by nebulization daily.     Marland Kitchen apixaban (ELIQUIS) 5 MG TABS tablet Take 1 tablet (5 mg total) by mouth 2 (two) times daily. 60 tablet 6  . arformoterol (BROVANA) 15 MCG/2ML NEBU Take 15 mcg by nebulization 2 (two) times daily.     Marland Kitchen atorvastatin (LIPITOR) 20 MG tablet Take 20 mg by mouth every evening.     . budesonide (PULMICORT) 0.25 MG/2ML nebulizer solution Take 0.25 mg by nebulization 2 (two) times daily.     . Calcium Carb-Cholecalciferol (CALCIUM + D3) 600-200 MG-UNIT TABS Take 1 tablet by mouth 2  (two) times daily.     . clonazePAM (KLONOPIN) 1 MG tablet Take 1 tablet (1 mg total) by mouth at bedtime. 30 tablet 0  . ferrous sulfate 325 (65 FE) MG tablet Take 325 mg by mouth 2 (two) times daily with a meal.    . gabapentin (NEURONTIN) 300 MG capsule Take 300 mg by mouth at bedtime.     Marland Kitchen HYDROcodone-acetaminophen (NORCO/VICODIN) 5-325 MG per tablet Take 1 tablet by mouth every 6 (six) hours as needed for moderate pain. 120 tablet 0  . levocetirizine (XYZAL) 5 MG tablet Take 1 tablet by mouth daily.    . metoprolol tartrate (LOPRESSOR) 25 MG tablet Take 0.5 tablets (12.5 mg total) by mouth 2 (two) times daily. 30 tablet 6  . montelukast (SINGULAIR) 10 MG tablet Take 10 mg by mouth daily.    . Multiple Vitamin (MULTIVITAMIN WITH MINERALS) TABS tablet Take 1 tablet by mouth daily.    . nitroGLYCERIN (NITROSTAT) 0.4 MG SL tablet Place 0.4 mg under the tongue every 5 (five) minutes as needed for chest pain.     Marland Kitchen omeprazole (PRILOSEC) 20 MG capsule Take 1 capsule by mouth daily.    . OXYGEN Inhale 2 L into the lungs at bedtime.    . traMADol (ULTRAM) 50 MG tablet Take 1 tablet by mouth 2 (two) times daily as needed.    . venlafaxine (EFFEXOR) 75 MG tablet Take 75 mg by mouth 3 (three) times daily with  meals.      No current facility-administered medications for this visit.    Past Medical History  Diagnosis Date  . Cancer     breast  . Hypertension   . COPD (chronic obstructive pulmonary disease)     Past Surgical History  Procedure Laterality Date  . Cardiac catheterization    . Total knee arthroplasty      bilateral  . Total hip arthroplasty      right  . Rotator cuff repair      left  . Mastectomy      right   . Orif ankle fracture Left 06/01/2014    Procedure: OPEN REDUCTION INTERNAL FIXATION (ORIF) LEFT ANKLE FRACTURE;  Surgeon: Marybelle Killings, MD;  Location: [place];  Service: Orthopedics;  Laterality: Left;    Social History   Social History  . Marital Status: Widowed      Spouse Name: N/A  . Number of Children: N/A  . Years of Education: N/A   Occupational History  . Not on file.   Social History Main Topics  . Smoking status: Never Smoker   . Smokeless tobacco: Never Used  . Alcohol Use: No  . Drug Use: No  . Sexual Activity: Not on file   Other Topics Concern  . Not on file   Social History Narrative     Filed Vitals:   12/06/14 1440  BP: 162/82  Pulse: 69  Height: 5\' 4"  (1.626 m)  Weight: 150 lb (68.04 kg)    PHYSICAL EXAM General: NAD Neck: No JVD, no thyromegaly or thyroid nodule.  Lungs: Clear to auscultation bilaterally with normal respiratory effort. CV: Nondisplaced PMI. Regular rate and mostly regular rhythm with premature contractions appreciated, normal S1/S2, no S3/S4, soft 1/6 holosystolic murmur along the left lower sternal border. No pedal edema. Abdomen: Soft, nontender,no distention.  Skin: Intact without lesions or rashes.  Neurologic: Alert and oriented x 3.  Psych: Normal affect. Extremities: No clubbing or cyanosis.  HEENT: Normal.    ECG: Most recent ECG reviewed.      ASSESSMENT AND PLAN: 1. Persistent atrial fibrillation: Will increase metoprolol to 25 mg twice daily. CHADSVASC 6 thus high thromboembolic risk. Continue apixaban 5 mg bid.  2. Essential HTN: Elevated today. Will monitor given increase in metoprolol dose.  3. Hyperlipidemia: Continue Lipitor 20 mg.  Dispo: f/u 3-4 months.  Kate Sable, M.D., F.A.C.C.

## 2014-12-07 ENCOUNTER — Ambulatory Visit (HOSPITAL_COMMUNITY)
Admission: RE | Admit: 2014-12-07 | Discharge: 2014-12-07 | Disposition: A | Discharge status: Home / Self Care | Payer: Medicare Other | Source: Ambulatory Visit | Attending: Internal Medicine | Admitting: Internal Medicine

## 2014-12-07 DIAGNOSIS — R0602 Shortness of breath: Secondary | ICD-10-CM | POA: Insufficient documentation

## 2014-12-07 MED ORDER — ALBUTEROL SULFATE (2.5 MG/3ML) 0.083% IN NEBU
2.5000 mg | INHALATION_SOLUTION | Freq: Once | RESPIRATORY_TRACT | Status: AC
  Administered 2014-12-07: 2.5 mg via RESPIRATORY_TRACT

## 2014-12-08 LAB — PULMONARY FUNCTION TEST
DL/VA % PRED: 83 %
DL/VA: 4.03 ml/min/mmHg/L
DLCO UNC % PRED: 22 %
DLCO UNC: 5.56 ml/min/mmHg
FEF 25-75 POST: 0.46 L/s
FEF 25-75 Pre: 0.43 L/sec
FEF2575-%Change-Post: 8 %
FEF2575-%PRED-POST: 32 %
FEF2575-%PRED-PRE: 29 %
FEV1-%Change-Post: -2 %
FEV1-%Pred-Post: 43 %
FEV1-%Pred-Pre: 44 %
FEV1-Post: 0.86 L
FEV1-Pre: 0.89 L
FEV1FVC-%Change-Post: 2 %
FEV1FVC-%PRED-PRE: 86 %
FEV6-%CHANGE-POST: -4 %
FEV6-%Pred-Post: 52 %
FEV6-%Pred-Pre: 54 %
FEV6-Post: 1.32 L
FEV6-Pre: 1.38 L
FEV6FVC-%PRED-POST: 105 %
FEV6FVC-%Pred-Pre: 105 %
FVC-%Change-Post: -4 %
FVC-%PRED-POST: 49 %
FVC-%Pred-Pre: 52 %
FVC-POST: 1.32 L
FVC-PRE: 1.39 L
Post FEV1/FVC ratio: 65 %
Post FEV6/FVC ratio: 100 %
Pre FEV1/FVC ratio: 64 %
Pre FEV6/FVC Ratio: 100 %
RV % pred: 130 %
RV: 3.1 L
TLC % pred: 87 %
TLC: 4.42 L

## 2014-12-15 DIAGNOSIS — M19012 Primary osteoarthritis, left shoulder: Secondary | ICD-10-CM | POA: Diagnosis not present

## 2014-12-15 DIAGNOSIS — M25571 Pain in right ankle and joints of right foot: Secondary | ICD-10-CM | POA: Diagnosis not present

## 2014-12-16 ENCOUNTER — Telehealth: Payer: Self-pay | Admitting: Cardiovascular Disease

## 2014-12-16 NOTE — Telephone Encounter (Signed)
Mrs, Amanda Palmer is calling to see if it is ok for her to take CQ10 (over the counter) while she is taking Eliquis.

## 2014-12-16 NOTE — Telephone Encounter (Signed)
Spoke with patient and instructed her that she can continue taking the CoQ10 with her Eliquis.  She plans to continuing taking 1 tablet daily and she will monitor for increased risk of bleeding and call with any other questions and she verbalized understanding.

## 2015-01-14 ENCOUNTER — Other Ambulatory Visit: Payer: Self-pay | Admitting: Internal Medicine

## 2015-01-24 ENCOUNTER — Telehealth: Payer: Self-pay | Admitting: Cardiovascular Disease

## 2015-01-24 NOTE — Telephone Encounter (Signed)
Mrs. Nordine wants to know if she can take Viactiv Calcium

## 2015-01-24 NOTE — Telephone Encounter (Signed)
Patient notified and verbalized understanding. 

## 2015-01-24 NOTE — Telephone Encounter (Signed)
yes

## 2015-02-06 DIAGNOSIS — Z1231 Encounter for screening mammogram for malignant neoplasm of breast: Secondary | ICD-10-CM | POA: Diagnosis not present

## 2015-02-09 DIAGNOSIS — R140 Abdominal distension (gaseous): Secondary | ICD-10-CM | POA: Diagnosis not present

## 2015-02-09 DIAGNOSIS — R1084 Generalized abdominal pain: Secondary | ICD-10-CM | POA: Diagnosis not present

## 2015-02-15 ENCOUNTER — Other Ambulatory Visit (HOSPITAL_COMMUNITY): Payer: Self-pay | Admitting: Internal Medicine

## 2015-02-15 ENCOUNTER — Ambulatory Visit (HOSPITAL_COMMUNITY)
Admission: RE | Admit: 2015-02-15 | Discharge: 2015-02-15 | Disposition: A | Discharge status: Home / Self Care | Payer: Medicare Other | Source: Ambulatory Visit | Attending: Internal Medicine | Admitting: Internal Medicine

## 2015-02-15 DIAGNOSIS — R140 Abdominal distension (gaseous): Secondary | ICD-10-CM | POA: Insufficient documentation

## 2015-02-15 DIAGNOSIS — R110 Nausea: Secondary | ICD-10-CM

## 2015-02-15 DIAGNOSIS — R109 Unspecified abdominal pain: Secondary | ICD-10-CM

## 2015-02-23 ENCOUNTER — Ambulatory Visit: Payer: Medicare Other | Admitting: Cardiovascular Disease

## 2015-02-23 ENCOUNTER — Other Ambulatory Visit (INDEPENDENT_AMBULATORY_CARE_PROVIDER_SITE_OTHER): Payer: Medicare Other

## 2015-02-23 ENCOUNTER — Ambulatory Visit (INDEPENDENT_AMBULATORY_CARE_PROVIDER_SITE_OTHER)
Admission: RE | Admit: 2015-02-23 | Discharge: 2015-02-23 | Disposition: A | Discharge status: Home / Self Care | Payer: Medicare Other | Source: Ambulatory Visit | Attending: Internal Medicine | Admitting: Internal Medicine

## 2015-02-23 ENCOUNTER — Encounter: Payer: Self-pay | Admitting: Internal Medicine

## 2015-02-23 ENCOUNTER — Ambulatory Visit (INDEPENDENT_AMBULATORY_CARE_PROVIDER_SITE_OTHER): Payer: Medicare Other | Admitting: Internal Medicine

## 2015-02-23 VITALS — BP 124/74 | HR 60 | Ht 64.0 in | Wt 150.0 lb

## 2015-02-23 DIAGNOSIS — R0600 Dyspnea, unspecified: Secondary | ICD-10-CM

## 2015-02-23 DIAGNOSIS — R0602 Shortness of breath: Secondary | ICD-10-CM | POA: Diagnosis not present

## 2015-02-23 DIAGNOSIS — R05 Cough: Secondary | ICD-10-CM | POA: Diagnosis not present

## 2015-02-23 LAB — CBC WITH DIFFERENTIAL/PLATELET
BASOS PCT: 0.4 % (ref 0.0–3.0)
Basophils Absolute: 0 10*3/uL (ref 0.0–0.1)
Eosinophils Absolute: 0.1 10*3/uL (ref 0.0–0.7)
Eosinophils Relative: 0.8 % (ref 0.0–5.0)
HCT: 38.3 % (ref 36.0–46.0)
Hemoglobin: 12.6 g/dL (ref 12.0–15.0)
LYMPHS ABS: 1.3 10*3/uL (ref 0.7–4.0)
LYMPHS PCT: 16.7 % (ref 12.0–46.0)
MCHC: 33 g/dL (ref 30.0–36.0)
MCV: 92.7 fl (ref 78.0–100.0)
Monocytes Absolute: 0.5 10*3/uL (ref 0.1–1.0)
Monocytes Relative: 6 % (ref 3.0–12.0)
NEUTROS PCT: 76.1 % (ref 43.0–77.0)
Neutro Abs: 6 10*3/uL (ref 1.4–7.7)
Platelets: 263 10*3/uL (ref 150.0–400.0)
RBC: 4.13 Mil/uL (ref 3.87–5.11)
RDW: 14.9 % (ref 11.5–15.5)
WBC: 7.9 10*3/uL (ref 4.0–10.5)

## 2015-02-23 LAB — BASIC METABOLIC PANEL
BUN: 14 mg/dL (ref 6–23)
CALCIUM: 10.2 mg/dL (ref 8.4–10.5)
CO2: 34 meq/L — AB (ref 19–32)
Chloride: 101 mEq/L (ref 96–112)
Creatinine, Ser: 0.92 mg/dL (ref 0.40–1.20)
GFR: 62.54 mL/min (ref >60.00)
Glucose, Bld: 85 mg/dL (ref 70–99)
Potassium: 4.6 mEq/L (ref 3.5–5.1)
SODIUM: 139 meq/L (ref 135–145)

## 2015-02-23 LAB — BRAIN NATRIURETIC PEPTIDE: Pro B Natriuretic peptide (BNP): 120 pg/mL — ABNORMAL HIGH (ref 0.0–100.0)

## 2015-02-23 LAB — TSH: TSH: 2.54 u[IU]/mL (ref 0.35–4.50)

## 2015-02-23 MED ORDER — FAMOTIDINE 20 MG PO TABS: ORAL_TABLET | ORAL | Status: DC

## 2015-02-23 MED ORDER — PANTOPRAZOLE SODIUM 40 MG PO TBEC: 40.0000 mg | DELAYED_RELEASE_TABLET | Freq: Every day | ORAL | Status: DC

## 2015-02-23 MED ORDER — PREDNISONE 10 MG PO TABS
ORAL_TABLET | ORAL | Status: DC
Start: 2015-02-23 — End: 2015-03-28

## 2015-02-23 NOTE — Patient Instructions (Addendum)
Prednisone 10 mg take 2 daily until breathing/ coughing better then 1 daily x 3 days only and stop   Plan A = automatic = brovana/budesonide twice daily - 1st thing in am and then 12 hours later  Plan B=  Only use your albuterol as a rescue medication to be used if you can't catch your breath by resting or doing a relaxed purse lip breathing pattern.  - The less you use it, the better it will work when you need it. - Ok to use up to every 4 hours if you must but call for immediate appointment if use goes up over your usual need - Don't leave home without it !!  (think of it like the spare tire for your car)   Pantoprazole (protonix) 40 mg   Take  30-60 min before first meal of the day and Pepcid (famotidine)  20 mg one @  bedtime until return to office - this is the best way to tell whether stomach acid is contributing to your problem.   GERD (REFLUX)  is an extremely common cause of respiratory symptoms just like yours , many times with no obvious heartburn at all.    It can be treated with medication, but also with lifestyle changes including elevation of the head of your bed (ideally with 6 inch  bed blocks),  Smoking cessation, avoidance of late meals, excessive alcohol, and avoid fatty foods, chocolate, peppermint, colas, red wine, and acidic juices such as orange juice.  NO MINT OR MENTHOL PRODUCTS SO NO COUGH DROPS  USE SUGARLESS CANDY INSTEAD (Jolley ranchers or Stover's or Life Savers) or even ice chips will also do - the key is to swallow to prevent all throat clearing. NO OIL BASED VITAMINS - use powdered substitutes.    Please schedule a follow up office visit in 4 weeks, sooner if needed

## 2015-02-23 NOTE — Assessment & Plan Note (Addendum)
PFT's 12/07/14  FEV1  0.86 (43%) ratio 65 with no resp to [place] and dlco 22 corrects to 83% for alv vol  02/23/2015  Walked RA x 3 laps @ 185 ft each stopped due to  End of study, nl pace, min sob/ no  desat    Symptoms are markedly disproportionate to objective findings and not clear this is all a  lung problem but pt does appear to have difficult airway management issues.  DDX of  difficult airways management all start with A and  include Adherence, Ace Inhibitors, Acid Reflux, Active Sinus Disease, Alpha 1 Antitripsin deficiency, Anxiety masquerading as Airways dz,  ABPA,  allergy(esp in young), Aspiration (esp in elderly), Adverse effects of meds,  Active smokers, A bunch of PE's (a small clot burden can't cause this syndrome unless there is already severe underlying pulm or vascular dz with poor reserve) plus two Bs  = Bronchiectasis and Beta blocker use..and one C= CHF    Adherence is always the initial "prime suspect" and is a multilayered concern that requires a "trust but verify" approach in every patient - starting with knowing how to use medications, especially inhalers, correctly, keeping up with refills and understanding the fundamental difference between maintenance and prns vs those medications only taken for a very short course and then stopped and not refilled.  - easily confused with details of care/ over using saba at baseline, reviewed  ? Acid (or non-acid) GERD > always difficult to exclude as up to 75% of pts in some series report no assoc GI/ Heartburn symptoms> rec max (24h)  acid suppression and diet restrictions(including elimination of cough drops)/ reviewed and instructions given in writing.   ? Allery/ chronic asthma suggested by pfts > continue singulair and add pred 10 x 2 until better then 10 mg per day x 3 and off   ? Anxiety > usually at the bottom of this list of usual suspects but should be much higher on this pt's based on H and P and note already on psychotropics .    ? A bunch of PE's > very unlikely on eliquis   ? chf > seems unlikely with bnp so low   I had an extended discussion with the patient reviewing all relevant studies completed to date and  lasting 35  minutes of a 60 minute visit    Each maintenance medication was reviewed in detail including most importantly the difference between maintenance and prns and under what circumstances the prns are to be triggered using an action plan format that is not reflected in the computer generated alphabetically organized AVS.    Please see instructions for details which were reviewed in writing and the patient given a copy highlighting the part that I personally wrote and discussed at today's ov.

## 2015-02-23 NOTE — Progress Notes (Signed)
Subjective:    Patient ID: Amanda Palmer, female    DOB: Oct 19, 1933,    MRN: [id number]  HPI  42 yowf never smoker with onset of cough and sob while living  in [place] in her 1s  And gradually worse with evidence of  Mild airflow obstruction on PFT's  11/30/14 so referred by  Dr Delphina Cahill to pulmonary clinic 02/23/2015    02/23/2015 1st [facility] office visit/ [facility]   Chief Complaint  Patient presents with  . Pulmonary Consult    Referred by Dr. Wende Neighbors for eval of abnormal PFT. Pt c/o cough and SOB for "years" worse for the past 3 yrs.  Her cough is prod in the am's with yellow sputum.  She gets SOB walking "not very far".   cough x 20 years  Every day but not every night worse x 3 years/ min productive Doe x one aisle at grocery store like HT  but varies quite a bit despite brovana /bud maint and in between using saba neb  Much better on pred but high doses make her shaky.   No obvious   patterns in day to day or daytime variabilty or assoc  cp or chest tightness, subjective wheeze overt sinus or hb symptoms. No unusual exp hx or h/o childhood pna/ asthma or knowledge of premature birth.  Sleeping ok without nocturnal  or early am exacerbation  of respiratory  c/o's or need for noct saba. Also denies any obvious fluctuation of symptoms with weather or environmental changes or other aggravating or alleviating factors except as outlined above   Current Medications, Allergies, Complete Past Medical History, Past Surgical History, Family History, and Social History were reviewed in [employer] record.          Review of Systems  Constitutional: Negative for fever, chills and unexpected weight change.  HENT: Negative for congestion, dental problem, ear pain, nosebleeds, postnasal drip, rhinorrhea, sinus pressure, sneezing, sore throat, trouble swallowing and voice change.   Eyes: Negative for visual disturbance.  Respiratory: Positive for cough and  shortness of breath. Negative for choking.   Cardiovascular: Negative for chest pain and leg swelling.  Gastrointestinal: Negative for vomiting, abdominal pain and diarrhea.       Acid heartburn Indigestion  Genitourinary: Negative for difficulty urinating.  Musculoskeletal: Positive for arthralgias.  Skin: Negative for rash.  Neurological: Negative for tremors, syncope and headaches.  Hematological: Does not bruise/bleed easily.       Objective:   Physical Exam  Pale amb wf nad/ freq throat clearing / mild pseudowheeze only   Wt Readings from Last 3 Encounters:  02/23/15 150 lb (68.04 kg)  12/06/14 150 lb (68.04 kg)  09/09/14 150 lb (68.04 kg)    Vital signs reviewed   HEENT: nl dentition, turbinates, and oropharynx. Nl external ear canals without cough reflex   NECK :  without JVD/Nodes/TM/ nl carotid upstrokes bilaterally   LUNGS: no acc muscle use,  Nl contour chest which is clear to A and P bilaterally without cough on insp or exp maneuvers   CV:  RRR  no s3 or murmur or increase in P2, no edema   ABD:  soft and nontender with nl inspiratory excursion in the supine position. No bruits or organomegaly, bowel sounds nl  MS:  Nl gait/ ext warm without deformities, calf tenderness, cyanosis or clubbing No obvious joint restrictions   SKIN: warm and dry without lesions    NEURO:  alert, approp,  nl sensorium with  no motor deficits    CXR PA and Lateral:   02/23/2015 :    I personally reviewed images and agree with radiology impression as follows:    The lungs are hyperinflated with hemidiaphragm flattening. There is no focal infiltrate. There is no pleural effusion. The heart and pulmonary vascularity are normal. The mediastinum is normal in width. There is gentle dextrocurvature centered in the mid thoracic Spine.    Labs ordered/ reviewed:    Lab 02/23/15 1058  NA 139  K 4.6  CL 101  CO2 34*  BUN 14  CREATININE 0.92  GLUCOSE 85       Lab  02/23/15 1058  HGB 12.6  HCT 38.3  WBC 7.9  PLT 263.0     Lab Results  Component Value Date   TSH 2.54 02/23/2015     Lab Results  Component Value Date   PROBNP 120.0* 02/23/2015             Assessment & Plan:

## 2015-02-24 ENCOUNTER — Telehealth: Payer: Self-pay | Admitting: Internal Medicine

## 2015-02-24 LAB — ALLERGY FULL PROFILE
Alternaria Alternata: <0.1 kU/L
Aspergillus fumigatus, m3: <0.1 kU/L
Bahia Grass: <0.1 kU/L
Common Ragweed: <0.1 kU/L
Dog Dander: <0.1 kU/L
Elm IgE: <0.1 kU/L
Fescue: <0.1 kU/L
G005 Rye, Perennial: <0.1 kU/L
Goldenrod: <0.1 kU/L
Helminthosporium halodes: <0.1 kU/L
House Dust Hollister: <0.1 kU/L
IGE (IMMUNOGLOBULIN E), SERUM: 4 kU/L (ref <115)
Lamb's Quarters: <0.1 kU/L
Plantain: <0.1 kU/L
Stemphylium Botryosum: <0.1 kU/L
Sycamore Tree: <0.1 kU/L
Timothy Grass: <0.1 kU/L

## 2015-02-24 NOTE — Progress Notes (Signed)
Quick Note:  LMTCB ______ 

## 2015-02-24 NOTE — Telephone Encounter (Signed)
Per CXR: Result Note     Call pt: Reviewed cxr and no acute change so no change in recommendations made at ov   Per labs: Result Note     Call patient : Study is unremarkable, no change in recs- no evidence of a resp allergy  ----  I spoke with patient about results and she verbalized understanding and had no questions.

## 2015-03-02 ENCOUNTER — Ambulatory Visit: Payer: Medicare Other | Admitting: Cardiovascular Disease

## 2015-03-28 ENCOUNTER — Encounter: Payer: Self-pay | Admitting: Cardiovascular Disease

## 2015-03-28 ENCOUNTER — Ambulatory Visit (INDEPENDENT_AMBULATORY_CARE_PROVIDER_SITE_OTHER): Payer: PPO | Admitting: Internal Medicine

## 2015-03-28 ENCOUNTER — Encounter: Payer: Self-pay | Admitting: Internal Medicine

## 2015-03-28 ENCOUNTER — Ambulatory Visit (INDEPENDENT_AMBULATORY_CARE_PROVIDER_SITE_OTHER): Payer: PPO | Admitting: Cardiovascular Disease

## 2015-03-28 VITALS — BP 138/64 | HR 71 | Ht 64.0 in | Wt 155.6 lb

## 2015-03-28 VITALS — BP 146/77 | HR 64 | Ht 64.0 in | Wt 152.0 lb

## 2015-03-28 DIAGNOSIS — J438 Other emphysema: Secondary | ICD-10-CM

## 2015-03-28 DIAGNOSIS — R0600 Dyspnea, unspecified: Secondary | ICD-10-CM | POA: Diagnosis not present

## 2015-03-28 DIAGNOSIS — R0602 Shortness of breath: Secondary | ICD-10-CM

## 2015-03-28 DIAGNOSIS — I481 Persistent atrial fibrillation: Secondary | ICD-10-CM | POA: Diagnosis not present

## 2015-03-28 DIAGNOSIS — I10 Essential (primary) hypertension: Secondary | ICD-10-CM

## 2015-03-28 DIAGNOSIS — R058 Other specified cough: Secondary | ICD-10-CM

## 2015-03-28 DIAGNOSIS — I4819 Other persistent atrial fibrillation: Secondary | ICD-10-CM

## 2015-03-28 DIAGNOSIS — E785 Hyperlipidemia, unspecified: Secondary | ICD-10-CM | POA: Diagnosis not present

## 2015-03-28 DIAGNOSIS — R05 Cough: Secondary | ICD-10-CM

## 2015-03-28 NOTE — Patient Instructions (Signed)
Continue all current medications. Your physician wants you to follow up in: 6 months.  You will receive a reminder letter in the mail one-two months in advance.  If you don't receive a letter, please call our office to schedule the follow up appointment   

## 2015-03-28 NOTE — Progress Notes (Signed)
Subjective:    Patient ID: Amanda Palmer, female    DOB: 1933-07-23,    MRN: [id number]  HPI  34 yowf never smoker with onset of cough and sob while living  in [place] in her 54s  And gradually worse with evidence of  Mild airflow obstruction on PFT's  11/30/14 so referred by Amanda Palmer to pulmonary clinic 02/23/2015    02/23/2015 1st [facility] office visit/ Amanda Palmer   Chief Complaint  Patient presents with  . Pulmonary Consult    Referred by Amanda. Wende Palmer for eval of abnormal PFT. Pt c/o cough and SOB for "years" worse for the past 3 yrs.  Her cough is prod in the am's with yellow sputum.  She gets SOB walking "not very far".   cough x 20 years  Every day but not every night worse x 3 years  Doe x one aisle at grocery store like HT  but varies quite a bit despite brovana /bud maint and in between using saba neb  Much better on pred but high doses make her shaky.  rec Prednisone 10 mg take 2 daily until breathing/ coughing better then 1 daily x 3 days only and stop  Plan A = automatic = brovana/budesonide twice daily - 1st thing in am and then 12 hours later Plan B=  Only use your albuterol as a rescue medication  Pantoprazole (protonix) 40 mg   Take  30-60 min before first meal of the day and Pepcid (famotidine)  20 mg one @  bedtime until return to office - this is the best way to tell whether stomach acid is contributing to your problem.  GERD  Diet    03/28/2015  Extended f/u ov/Amanda Palmer re: sob/ cough x decades but worse x 3 years maint on brov/bud neb  Chief Complaint  Patient presents with  . Follow-up    pt. states breathing is up and down. SOB. wheezing. prod. cough yellow in color. occ. chest tightness.   symptoms are mostly noticeable during the day and rarely wake her up at night. She is bothered by episodes where she can't breathe for one breath in the next breath she's fine almost like choking. She is very easily confused with details of care and identifies her  medicines by the color and I was not able to ascertain as to whether she is actually following instructions she was given previously. She was not able to tolerate even the lowest dose of prednisone whereas previously she told me it seemed to help her.  No obvious day to day or daytime variability or assoc excess/ def purulent sputum or mucus plugs or hemoptysis or cp or chest tightness, subjective wheeze or overt sinus or hb symptoms. No unusual exp hx or h/o childhood pna/ asthma or knowledge of premature birth.  Sleeping ok without nocturnal  or early am exacerbation  of respiratory  c/o's or need for noct saba. Also denies any obvious fluctuation of symptoms with weather or environmental changes or other aggravating or alleviating factors except as outlined above   Current Medications, Allergies, Complete Past Medical History, Past Surgical History, Family History, and Social History were reviewed in [employer] record.  ROS  The following are not active complaints unless bolded sore throat, dysphagia, dental problems, itching, sneezing,  nasal congestion or excess/ purulent secretions, ear ache,   fever, chills, sweats, unintended wt loss, classically pleuritic or exertional cp,  orthopnea pnd or leg swelling, presyncope, palpitations, abdominal  pain, anorexia, nausea, vomiting, diarrhea  or change in bowel or bladder habits, change in stools or urine, dysuria,hematuria,  rash, arthralgias, visual complaints, headache, numbness, weakness or ataxia or problems with walking or coordination,  change in mood/affect or memory.                    Objective:   Physical Exam  Hoarse  amb wf nad / depressed affect/ hopeless   03/28/2015        156   02/23/15 150 lb (68.04 kg)  12/06/14 150 lb (68.04 kg)  09/09/14 150 lb (68.04 kg)    Vital signs reviewed   HEENT: nl dentition, turbinates, and oropharynx. Nl external ear canals without cough reflex   NECK :  without  JVD/Nodes/TM/ nl carotid upstrokes bilaterally   LUNGS: no acc muscle use,  Nl contour chest which is clear to A and P bilaterally without cough on insp or exp maneuvers   CV:  RRR  no s3 or murmur or increase in P2, no edema   ABD:  soft and nontender with nl inspiratory excursion in the supine position. No bruits or organomegaly, bowel sounds nl  MS:  Nl gait/ ext warm without deformities, calf tenderness, cyanosis or clubbing No obvious joint restrictions   SKIN: warm and dry without lesions    NEURO:  alert, approp, nl sensorium with  no motor deficits    CXR PA and Lateral:   02/23/2015 :    I personally reviewed images and agree with radiology impression as follows:    The lungs are hyperinflated with hemidiaphragm flattening. There is no focal infiltrate. There is no pleural effusion. The heart and pulmonary vascularity are normal. The mediastinum is normal in width. There is gentle dextrocurvature centered in the mid thoracic Spine.    Labs  reviewed:    Lab 02/23/15 1058  NA 139  K 4.6  CL 101  CO2 34*  BUN 14  CREATININE 0.92  GLUCOSE 85       Lab 02/23/15 1058  HGB 12.6  HCT 38.3  WBC 7.9  PLT 263.0     Lab Results  Component Value Date   TSH 2.54 02/23/2015     Lab Results  Component Value Date   PROBNP 120.0* 02/23/2015             Assessment & Plan:

## 2015-03-28 NOTE — Patient Instructions (Signed)
Be sure you are taking pantoprazole 40 mg Take 30-60 min before first meal of the day and pepcid ac 20 mg at bedtime   GERD (REFLUX)  is an extremely common cause of respiratory symptoms just like yours , many times with no obvious heartburn at all.    It can be treated with medication, but also with lifestyle changes including elevation of the head of your bed (ideally with 6 inch  bed blocks),  Smoking cessation, avoidance of late meals, excessive alcohol, and avoid fatty foods, chocolate, peppermint, colas, red wine, and acidic juices such as orange juice.  NO MINT OR MENTHOL PRODUCTS SO NO COUGH DROPS  USE SUGARLESS CANDY INSTEAD (Jolley ranchers or Stover's or Life Savers) or even ice chips will also do - the key is to swallow to prevent all throat clearing. NO OIL BASED VITAMINS - use powdered substitutes.    Take gabapentin 300 twice daily until you see Dr [name]   See Dr [name] with my comments and if you would like to return schedule a visit with full pfts and When return bring your medications in 2 separate bags, the ones you take no matter(automatically)  what vs the as needed (only when you feel you need them)

## 2015-03-28 NOTE — Progress Notes (Signed)
Patient ID: Amanda Palmer, female   DOB: 1933-06-20, 82 y.o.   MRN: [id number]      SUBJECTIVE: The patient returns for follow-up of atrial fibrillation. Denies chest pain. Seldom has palpitations. Has "breathing spells" where she has to take a deep breath which occurs 3 or 4 times per week. She sees her pulmonary physician later today.  Echocardiogram on 06/01/14 demonstrated normal left ventricular systolic function, EF 0000000, normal regional wall motion, grade 1 diastolic dysfunction, and mild left atrial dilatation.   Review of Systems: As per "subjective", otherwise negative.  Allergies  Allergen Reactions  . Morphine And Related Nausea And Vomiting  . Sulfa Antibiotics Nausea And Vomiting  . Penicillins Rash    Current Outpatient Prescriptions  Medication Sig Dispense Refill  . albuterol (PROVENTIL HFA;VENTOLIN HFA) 108 (90 BASE) MCG/ACT inhaler Inhale 1-2 puffs into the lungs every 6 (six) hours as needed for wheezing or shortness of breath.    Marland Kitchen albuterol (PROVENTIL) (2.5 MG/3ML) 0.083% nebulizer solution Take 2.5 mg by nebulization daily.     Marland Kitchen apixaban (ELIQUIS) 5 MG TABS tablet Take 1 tablet (5 mg total) by mouth 2 (two) times daily. 60 tablet 6  . arformoterol (BROVANA) 15 MCG/2ML NEBU Take 15 mcg by nebulization 2 (two) times daily.     Marland Kitchen atorvastatin (LIPITOR) 20 MG tablet Take 20 mg by mouth every evening.     . budesonide (PULMICORT) 0.25 MG/2ML nebulizer solution Take 0.25 mg by nebulization 2 (two) times daily.     . Calcium Carb-Cholecalciferol (CALCIUM + D3) 600-200 MG-UNIT TABS Take 1 tablet by mouth 2 (two) times daily.     . Calcium-Vitamin D-Vitamin K 500-500-40 MG-UNT-MCG CHEW Chew 1 tablet by mouth daily.    . clonazePAM (KLONOPIN) 1 MG tablet Take 1 tablet (1 mg total) by mouth at bedtime. 30 tablet 0  . famotidine (PEPCID) 20 MG tablet One at bedtime 30 tablet 2  . ferrous sulfate 325 (65 FE) MG tablet Take 325 mg by mouth 2 (two) times daily with a meal.     . gabapentin (NEURONTIN) 300 MG capsule Take 300 mg by mouth at bedtime.     Marland Kitchen HYDROcodone-acetaminophen (NORCO/VICODIN) 5-325 MG per tablet Take 1 tablet by mouth every 6 (six) hours as needed for moderate pain. 120 tablet 0  . levocetirizine (XYZAL) 5 MG tablet Take 1 tablet by mouth daily.    . metoprolol tartrate (LOPRESSOR) 25 MG tablet Take 1 tablet (25 mg total) by mouth 2 (two) times daily. 60 tablet 6  . montelukast (SINGULAIR) 10 MG tablet Take 10 mg by mouth daily.    . Multiple Vitamin (MULTIVITAMIN WITH MINERALS) TABS tablet Take 1 tablet by mouth daily.    . nitroGLYCERIN (NITROSTAT) 0.4 MG SL tablet Place 0.4 mg under the tongue every 5 (five) minutes as needed for chest pain.     . OXYGEN Inhale 2 L into the lungs at bedtime.    . pantoprazole (PROTONIX) 40 MG tablet Take 1 tablet (40 mg total) by mouth daily. Take 30-60 min before first meal of the day 30 tablet 2  . traMADol (ULTRAM) 50 MG tablet Take 1 tablet by mouth 2 (two) times daily as needed.    . venlafaxine (EFFEXOR) 75 MG tablet Take 75 mg by mouth 3 (three) times daily with meals.      No current facility-administered medications for this visit.    Past Medical History  Diagnosis Date  . Cancer ([place])  breast  . Hypertension   . COPD (chronic obstructive pulmonary disease) [facility])     Past Surgical History  Procedure Laterality Date  . Cardiac catheterization    . Total knee arthroplasty      bilateral  . Total hip arthroplasty      right  . Rotator cuff repair      left  . Mastectomy      right   . Orif ankle fracture Left 06/01/2014    Procedure: OPEN REDUCTION INTERNAL FIXATION (ORIF) LEFT ANKLE FRACTURE;  Surgeon: Marybelle Killings, MD;  Location: [facility];  Service: Orthopedics;  Laterality: Left;    Social History   Social History  . Marital Status: Widowed    Spouse Name: N/A  . Number of Children: N/A  . Years of Education: N/A   Occupational History  . Not on file.   Social History Main  Topics  . Smoking status: Never Smoker   . Smokeless tobacco: Never Used  . Alcohol Use: No  . Drug Use: No  . Sexual Activity: Not on file   Other Topics Concern  . Not on file   Social History Narrative     Filed Vitals:   03/28/15 0959  BP: 146/77  Pulse: 64  Height: 5\' 4"  (1.626 m)  Weight: 152 lb (68.947 kg)    PHYSICAL EXAM General: NAD Neck: No JVD, no thyromegaly or thyroid nodule.  Lungs: Clear to auscultation bilaterally with normal respiratory effort. CV: Nondisplaced PMI. Regular rate and mostly regular rhythm with premature contractions appreciated, normal S1/S2, no S3/S4, soft 1/6 holosystolic murmur along the left lower sternal border. No pedal edema. Abdomen: Soft, nontender,no distention.  Skin: Intact without lesions or rashes.  Neurologic: Alert and oriented x 3.  Psych: Normal affect. Extremities: No clubbing or cyanosis.  HEENT: Normal.   ECG: Most recent ECG reviewed.      ASSESSMENT AND PLAN: 1. Persistent atrial fibrillation: Symptomatically stable on metoprolol 25 mg twice daily. CHADSVASC 6 thus high thromboembolic risk. Continue apixaban 5 mg bid.  2. Essential HTN: Elevated today. Will monitor.  3. Hyperlipidemia: Continue Lipitor 20 mg.  4. Shortness of breath: I do not think this is cardiac in etiology. Will defer to pulmonary. Beta blockers at too low a dose to be causing problems.  Dispo: f/u 6 months.   Kate Sable, M.D., F.A.C.C.

## 2015-03-29 ENCOUNTER — Encounter: Payer: Self-pay | Admitting: Internal Medicine

## 2015-03-29 DIAGNOSIS — R0989 Other specified symptoms and signs involving the circulatory and respiratory systems: Secondary | ICD-10-CM | POA: Insufficient documentation

## 2015-03-29 DIAGNOSIS — R05 Cough: Secondary | ICD-10-CM | POA: Insufficient documentation

## 2015-03-29 DIAGNOSIS — R058 Other specified cough: Secondary | ICD-10-CM | POA: Insufficient documentation

## 2015-03-29 DIAGNOSIS — R059 Cough, unspecified: Secondary | ICD-10-CM | POA: Insufficient documentation

## 2015-03-29 NOTE — Assessment & Plan Note (Addendum)
PFT's 12/07/14  FEV1  0.86 (43%) ratio 65 with no resp to [place] and dlco 22 corrects to 83% for alv vol  02/23/2015  Walked RA x 3 laps @ 185 ft each stopped due to  End of study, nl pace, min sob/ no  desat   - allergy profile 02/23/15 >   IgE 4 with neg RAST    I strongly suspect we are dealing with uacs and not asthma or copd here (see separate a/p)   The diffusing capacity being reduced his confusing since it corrects completely to normal from a very low value but since there was no associated desaturation with ex I don't believe it's relevant.  The basis to say she is air trapping is based on her residual volume to total lung capacity ratio. This is effort dependent. Note there was no difference between her slow vital capacity and forced vital capacity which is normally seen in patients who are air trapping but is not seen here.    Pulmonary follow-up can be as needed.

## 2015-03-29 NOTE — Assessment & Plan Note (Addendum)
Based on the chronicity of her symptoms and the absence of significant sleep disruption after taking gabapentin at bedtime I strongly suspect here:   Classic Upper airway cough syndrome, so named because it's frequently impossible to sort out how much is  CR/sinusitis with freq throat clearing (which can be related to primary GERD)   vs  causing  secondary (" extra esophageal")  GERD from wide swings in gastric pressure that occur with throat clearing, often  promoting self use of mint and menthol lozenges that reduce the lower esophageal sphincter tone and exacerbate the problem further in a cyclical fashion.   These are the same pts (now being labeled as having "irritable larynx syndrome" by some cough centers) who not infrequently have a history of having failed to tolerate ace inhibitors,  dry powder inhalers or biphosphonates or report having atypical reflux symptoms that don't respond to standard doses of PPI , and are easily confused as having aecopd or asthma flares by even experienced allergists/ pulmonologists.   I recommended she increase gabapentin to 300 mg twice a day and push this on up to as much as 300 mg 4 times a day prior to any further pulmonary workup as this is usually effective treatment for irritable larynx syndrome which I strongly favor in this patient that says she can lose her breath without warning for one breath and then gets back, typical of vocal cord dysfunction not a pulmonary problem at all..  If not improved on this regimen I would consider referring her to the [facility] where Dr. Joya Gaskins there  has a lot of experience with this problem.  I had an extended discussion with the patient reviewing all relevant studies completed to date and  lasting 25 minutes of a 40  minute extended office visit    Each maintenance medication was reviewed in detail including most importantly the difference between maintenance and prns and under what circumstances the prns are to  be triggered using an action plan format that is not reflected in the computer generated alphabetically organized AVS.    Please see instructions for details which were reviewed in writing and the patient given a copy highlighting the part that I personally wrote and discussed at today's ov.

## 2015-04-18 ENCOUNTER — Ambulatory Visit: Payer: Medicare Other | Admitting: Emergency Medicine

## 2015-05-29 ENCOUNTER — Other Ambulatory Visit: Payer: Self-pay | Admitting: *Deleted

## 2015-05-29 MED ORDER — APIXABAN 5 MG PO TABS: 5.0000 mg | ORAL_TABLET | Freq: Two times a day (BID) | ORAL | Status: DC

## 2015-06-22 ENCOUNTER — Ambulatory Visit (INDEPENDENT_AMBULATORY_CARE_PROVIDER_SITE_OTHER): Payer: PPO | Admitting: Otolaryngology

## 2015-06-22 DIAGNOSIS — R590 Localized enlarged lymph nodes: Secondary | ICD-10-CM

## 2015-06-22 DIAGNOSIS — R490 Dysphonia: Secondary | ICD-10-CM | POA: Diagnosis not present

## 2015-08-16 DIAGNOSIS — R490 Dysphonia: Secondary | ICD-10-CM | POA: Insufficient documentation

## 2015-08-16 DIAGNOSIS — J383 Other diseases of vocal cords: Secondary | ICD-10-CM | POA: Insufficient documentation

## 2015-09-25 ENCOUNTER — Ambulatory Visit (INDEPENDENT_AMBULATORY_CARE_PROVIDER_SITE_OTHER): Payer: PPO | Admitting: Cardiovascular Disease

## 2015-09-25 ENCOUNTER — Encounter: Payer: Self-pay | Admitting: Cardiovascular Disease

## 2015-09-25 VITALS — BP 138/78 | HR 84 | Ht 64.0 in | Wt 152.0 lb

## 2015-09-25 DIAGNOSIS — I499 Cardiac arrhythmia, unspecified: Secondary | ICD-10-CM

## 2015-09-25 DIAGNOSIS — I4819 Other persistent atrial fibrillation: Secondary | ICD-10-CM

## 2015-09-25 DIAGNOSIS — I10 Essential (primary) hypertension: Secondary | ICD-10-CM

## 2015-09-25 DIAGNOSIS — R0609 Other forms of dyspnea: Secondary | ICD-10-CM | POA: Diagnosis not present

## 2015-09-25 DIAGNOSIS — E785 Hyperlipidemia, unspecified: Secondary | ICD-10-CM

## 2015-09-25 DIAGNOSIS — I481 Persistent atrial fibrillation: Secondary | ICD-10-CM | POA: Diagnosis not present

## 2015-09-25 MED ORDER — METOPROLOL TARTRATE 25 MG PO TABS: 25.0000 mg | ORAL_TABLET | Freq: Two times a day (BID) | ORAL | Status: DC

## 2015-09-25 MED ORDER — NITROGLYCERIN 0.4 MG SL SUBL: 0.4000 mg | SUBLINGUAL_TABLET | SUBLINGUAL | Status: DC | PRN

## 2015-09-25 NOTE — Progress Notes (Signed)
Patient ID: UMME HOST, female   DOB: [birth year], 82 y.o.   MRN: [id number]      SUBJECTIVE: The patient returns for follow-up of atrial fibrillation.   Echocardiogram on 06/01/14 demonstrated normal left ventricular systolic function, EF 0000000, normal regional wall motion, grade 1 diastolic dysfunction, and mild left atrial dilatation.  She seldom has palpitations lasting 2 or 3 minutes. She denies exertional chest pain and tightness. She does have exertional dyspnea when walking 40 yards and attributes it to the heat. She denies diminished levels of activity since her last visit.  ECG performed in the office today demonstrates sinus rhythm with a left anterior fascicular block.   Review of Systems: As per "subjective", otherwise negative.  Allergies  Allergen Reactions  . Morphine And Related Nausea And Vomiting  . Sulfa Antibiotics Nausea And Vomiting  . Penicillins Rash    Current Outpatient Prescriptions  Medication Sig Dispense Refill  . albuterol (PROVENTIL HFA;VENTOLIN HFA) 108 (90 BASE) MCG/ACT inhaler Inhale 1-2 puffs into the lungs every 6 (six) hours as needed for wheezing or shortness of breath.    Marland Kitchen albuterol (PROVENTIL) (2.5 MG/3ML) 0.083% nebulizer solution Take 2.5 mg by nebulization daily.     Marland Kitchen apixaban (ELIQUIS) 5 MG TABS tablet Take 1 tablet (5 mg total) by mouth 2 (two) times daily. 60 tablet 6  . arformoterol (BROVANA) 15 MCG/2ML NEBU Take 15 mcg by nebulization 2 (two) times daily.     Marland Kitchen atorvastatin (LIPITOR) 20 MG tablet Take 20 mg by mouth every evening.     . budesonide (PULMICORT) 0.25 MG/2ML nebulizer solution Take 0.25 mg by nebulization 2 (two) times daily.     . Calcium Carb-Cholecalciferol (CALCIUM + D3) 600-200 MG-UNIT TABS Take 1 tablet by mouth 2 (two) times daily.     . Calcium-Vitamin D-Vitamin K 500-500-40 MG-UNT-MCG CHEW Chew 1 tablet by mouth daily.    . clonazePAM (KLONOPIN) 1 MG tablet Take 1 tablet (1 mg total) by mouth at bedtime. 30  tablet 0  . famotidine (PEPCID) 20 MG tablet One at bedtime 30 tablet 2  . ferrous sulfate 325 (65 FE) MG tablet Take 325 mg by mouth 2 (two) times daily with a meal.    . gabapentin (NEURONTIN) 300 MG capsule Take 300 mg by mouth at bedtime.     Marland Kitchen HYDROcodone-acetaminophen (NORCO/VICODIN) 5-325 MG per tablet Take 1 tablet by mouth every 6 (six) hours as needed for moderate pain. 120 tablet 0  . levocetirizine (XYZAL) 5 MG tablet Take 1 tablet by mouth daily.    . metoprolol tartrate (LOPRESSOR) 25 MG tablet Take 1 tablet (25 mg total) by mouth 2 (two) times daily. 60 tablet 6  . montelukast (SINGULAIR) 10 MG tablet Take 10 mg by mouth daily.    . Multiple Vitamin (MULTIVITAMIN WITH MINERALS) TABS tablet Take 1 tablet by mouth daily.    . nitroGLYCERIN (NITROSTAT) 0.4 MG SL tablet Place 0.4 mg under the tongue every 5 (five) minutes as needed for chest pain.     . OXYGEN Inhale 2 L into the lungs at bedtime.    . pantoprazole (PROTONIX) 40 MG tablet Take 1 tablet (40 mg total) by mouth daily. Take 30-60 min before first meal of the day 30 tablet 2  . traMADol (ULTRAM) 50 MG tablet Take 1 tablet by mouth 2 (two) times daily as needed.    . venlafaxine (EFFEXOR) 75 MG tablet Take 75 mg by mouth 2 (two) times daily with a meal.  No current facility-administered medications for this visit.    Past Medical History  Diagnosis Date  . Cancer (HCC)     breast  . Hypertension   . COPD (chronic obstructive pulmonary disease) [facility])     Past Surgical History  Procedure Laterality Date  . Cardiac catheterization    . Total knee arthroplasty      bilateral  . Total hip arthroplasty      right  . Rotator cuff repair      left  . Mastectomy      right   . Orif ankle fracture Left 06/01/2014    Procedure: OPEN REDUCTION INTERNAL FIXATION (ORIF) LEFT ANKLE FRACTURE;  Surgeon: Marybelle Killings, MD;  Location: [facility];  Service: Orthopedics;  Laterality: Left;    Social History   Social History    . Marital Status: Widowed    Spouse Name: N/A  . Number of Children: N/A  . Years of Education: N/A   Occupational History  . Not on file.   Social History Main Topics  . Smoking status: Never Smoker   . Smokeless tobacco: Never Used  . Alcohol Use: No  . Drug Use: No  . Sexual Activity: Not on file   Other Topics Concern  . Not on file   Social History Narrative     Filed Vitals:   09/25/15 1300  BP: 138/78  Pulse: 84  Height: 5\' 4"  (1.626 m)  Weight: 152 lb (68.947 kg)  SpO2: 98%    PHYSICAL EXAM General: NAD Neck: No JVD, no thyromegaly or thyroid nodule.  Lungs: Clear to auscultation bilaterally with normal respiratory effort. CV: Nondisplaced PMI. Regular rate and mostly regular rhythm with premature contractions appreciated, normal S1/S2, no S3/S4, soft 1/6 holosystolic murmur along the left lower sternal border. No pedal edema. Abdomen: Soft, nontender,no distention.  Skin: Intact without lesions or rashes.  Neurologic: Alert and oriented x 3.  Psych: Normal affect. Extremities: No clubbing or cyanosis.  HEENT: Normal.    ECG: Most recent ECG reviewed.      ASSESSMENT AND PLAN: 1. Persistent atrial fibrillation: Symptomatically stable on metoprolol 25 mg twice daily. CHADSVASC 6 thus high thromboembolic risk. Continue apixaban 5 mg bid.  2. Essential HTN: Controlled. No changes.  3. Hyperlipidemia: Continue Lipitor 20 mg.  4. DOE: Will monitor. Has seen pulmonary with some suspicion of vocal cord dysfunction. If symptoms persist would consider stress testing at next visit.  Dispo: f/u 6 months.  Kate Sable, M.D., F.A.C.C.

## 2015-09-25 NOTE — Patient Instructions (Signed)
Medication Instructions:  Nitroglycerin refill sent to [facility] today. Continue all other medications.    Labwork: NONE  Testing/Procedures: NONE  Follow-Up: Your physician wants you to follow up in: 6 months.  You will receive a reminder letter in the mail one-two months in advance.  If you don't receive a letter, please call our office to schedule the follow up appointment   Any Other Special Instructions Will Be Listed Below (If Applicable).  If you need a refill on your cardiac medications before your next appointment, please call your pharmacy.

## 2015-11-04 DIAGNOSIS — T1490 Injury, unspecified: Secondary | ICD-10-CM | POA: Diagnosis not present

## 2015-11-14 ENCOUNTER — Telehealth (HOSPITAL_COMMUNITY): Payer: Self-pay

## 2015-11-14 ENCOUNTER — Ambulatory Visit (HOSPITAL_COMMUNITY): Payer: PPO | Admitting: Speech Pathology

## 2015-11-14 NOTE — Telephone Encounter (Signed)
11/14/15  Pt left a message that she wouldn't be at her appt today, she had another appt.  I will call her to see if she wants to reschedule.

## 2015-11-20 ENCOUNTER — Other Ambulatory Visit: Payer: Self-pay | Admitting: Obstetrics and Gynecology

## 2015-11-20 ENCOUNTER — Other Ambulatory Visit (HOSPITAL_COMMUNITY): Payer: Self-pay | Admitting: Internal Medicine

## 2015-11-20 DIAGNOSIS — N644 Mastodynia: Secondary | ICD-10-CM

## 2015-11-21 ENCOUNTER — Ambulatory Visit (HOSPITAL_COMMUNITY)
Admission: RE | Admit: 2015-11-21 | Discharge: 2015-11-21 | Disposition: A | Discharge status: Home / Self Care | Payer: PPO | Source: Ambulatory Visit | Attending: Internal Medicine | Admitting: Internal Medicine

## 2015-11-21 ENCOUNTER — Other Ambulatory Visit (HOSPITAL_COMMUNITY): Payer: Self-pay

## 2015-11-21 ENCOUNTER — Other Ambulatory Visit (HOSPITAL_COMMUNITY): Payer: Self-pay | Admitting: Internal Medicine

## 2015-11-21 ENCOUNTER — Ambulatory Visit: Payer: PPO | Attending: Pulmonary Disease | Admitting: Neurology

## 2015-11-21 DIAGNOSIS — J449 Chronic obstructive pulmonary disease, unspecified: Secondary | ICD-10-CM | POA: Insufficient documentation

## 2015-11-21 DIAGNOSIS — N644 Mastodynia: Secondary | ICD-10-CM | POA: Diagnosis present

## 2015-11-21 DIAGNOSIS — G4733 Obstructive sleep apnea (adult) (pediatric): Secondary | ICD-10-CM | POA: Insufficient documentation

## 2015-11-21 DIAGNOSIS — G4730 Sleep apnea, unspecified: Secondary | ICD-10-CM

## 2015-11-21 DIAGNOSIS — R001 Bradycardia, unspecified: Secondary | ICD-10-CM | POA: Insufficient documentation

## 2015-11-21 DIAGNOSIS — R499 Unspecified voice and resonance disorder: Secondary | ICD-10-CM | POA: Insufficient documentation

## 2015-11-21 DIAGNOSIS — G4759 Other parasomnia: Secondary | ICD-10-CM | POA: Diagnosis not present

## 2015-11-30 NOTE — Procedures (Signed)
Dana A. Merlene Laughter, MD     [email or web address]             NOCTURNAL POLYSOMNOGRAPHY   LOCATION: [facility]   Patient Name: Amanda Palmer, Amanda Palmer Date: 11/21/2015 Gender: Female D.O.B: December 02, 1933 Age (years): 87 Referring Provider: Lennox Solders Height (inches): 64 Interpreting Physician: Phillips Odor MD, ABSM Weight (lbs): 152 RPSGT: Rosebud Poles BMI: 26 MRN: [id number] Neck Size: 16.50 CLINICAL INFORMATION Sleep Study Type: NPSG Indication for sleep study: N/A Epworth Sleepiness Score: 6 SLEEP STUDY TECHNIQUE As per the AASM Manual for the Scoring of Sleep and Associated Events v2.3 (April 2016) with a hypopnea requiring 4% desaturations. The channels recorded and monitored were frontal, central and occipital EEG, electrooculogram (EOG), submentalis EMG (chin), nasal and oral airflow, thoracic and abdominal wall motion, anterior tibialis EMG, snore microphone, electrocardiogram, and pulse oximetry. MEDICATIONS Patient's medications include: N/A. Medications self-administered by patient during sleep study : No sleep medicine administered.  Current Outpatient Prescriptions:  .  albuterol (PROVENTIL HFA;VENTOLIN HFA) 108 (90 BASE) MCG/ACT inhaler, Inhale 1-2 puffs into the lungs every 6 (six) hours as needed for wheezing or shortness of breath., Disp: , Rfl:  .  albuterol (PROVENTIL) (2.5 MG/3ML) 0.083% nebulizer solution, Take 2.5 mg by nebulization daily. , Disp: , Rfl:  .  apixaban (ELIQUIS) 5 MG TABS tablet, Take 1 tablet (5 mg total) by mouth 2 (two) times daily., Disp: 60 tablet, Rfl: 6 .  arformoterol (BROVANA) 15 MCG/2ML NEBU, Take 15 mcg by nebulization 2 (two) times daily. , Disp: , Rfl:  .  atorvastatin (LIPITOR) 20 MG tablet, Take 20 mg by mouth every evening. , Disp: , Rfl:  .  budesonide (PULMICORT) 0.25 MG/2ML nebulizer solution, Take 0.25 mg by nebulization 2 (two) times daily. , Disp: , Rfl:  .  Calcium Carb-Cholecalciferol (CALCIUM +  D3) 600-200 MG-UNIT TABS, Take 1 tablet by mouth 2 (two) times daily. , Disp: , Rfl:  .  Calcium-Vitamin D-Vitamin K [id number] MG-UNT-MCG CHEW, Chew 1 tablet by mouth daily., Disp: , Rfl:  .  clonazePAM (KLONOPIN) 1 MG tablet, Take 1 tablet (1 mg total) by mouth at bedtime., Disp: 30 tablet, Rfl: 0 .  famotidine (PEPCID) 20 MG tablet, One at bedtime, Disp: 30 tablet, Rfl: 2 .  ferrous sulfate 325 (65 FE) MG tablet, Take 325 mg by mouth 2 (two) times daily with a meal., Disp: , Rfl:  .  gabapentin (NEURONTIN) 300 MG capsule, Take 300 mg by mouth at bedtime. , Disp: , Rfl:  .  HYDROcodone-acetaminophen (NORCO/VICODIN) 5-325 MG per tablet, Take 1 tablet by mouth every 6 (six) hours as needed for moderate pain., Disp: 120 tablet, Rfl: 0 .  levocetirizine (XYZAL) 5 MG tablet, Take 1 tablet by mouth daily., Disp: , Rfl:  .  metoprolol tartrate (LOPRESSOR) 25 MG tablet, Take 1 tablet (25 mg total) by mouth 2 (two) times daily., Disp: 60 tablet, Rfl: 6 .  montelukast (SINGULAIR) 10 MG tablet, Take 10 mg by mouth daily., Disp: , Rfl:  .  Multiple Vitamin (MULTIVITAMIN WITH MINERALS) TABS tablet, Take 1 tablet by mouth daily., Disp: , Rfl:  .  nitroGLYCERIN (NITROSTAT) 0.4 MG SL tablet, Place 1 tablet (0.4 mg total) under the tongue every 5 (five) minutes as needed for chest pain., Disp: 25 tablet, Rfl: 3 .  OXYGEN, Inhale 2 L into the lungs at bedtime., Disp: , Rfl:  .  pantoprazole (PROTONIX) 40 MG tablet, Take 1 tablet (40 mg total) by mouth daily. Take 30-60  min before first meal of the day, Disp: 30 tablet, Rfl: 2 .  traMADol (ULTRAM) 50 MG tablet, Take 1 tablet by mouth 2 (two) times daily as needed., Disp: , Rfl:  .  venlafaxine (EFFEXOR) 75 MG tablet, Take 75 mg by mouth 2 (two) times daily with a meal. , Disp: , Rfl:   SLEEP ARCHITECTURE The study was initiated at 10:17:57 PM and ended at 4:34:02 AM. Sleep onset time was 25.3 minutes and the sleep efficiency was 77.0%. The total sleep time was  289.5 minutes. Stage REM latency was 289.0 minutes. The patient spent 10.02% of the night in stage N1 sleep, 63.90% in stage N2 sleep, 17.79% in stage N3 and 8.29% in REM. Alpha intrusion was absent. Supine sleep was 0.00%. RESPIRATORY PARAMETERS The overall apnea/hypopnea index (AHI) was 0.8 per hour. There were 4 total apneas, including 4 obstructive, 0 central and 0 mixed apneas. There were 0 hypopneas and 0 RERAs. The AHI during Stage REM sleep was 0.0 per hour. AHI while supine was N/A per hour. The mean oxygen saturation was 98.26%. The minimum SpO2 during sleep was 90.00%. Loud snoring was noted during this study. CARDIAC DATA The 2 lead EKG demonstrated sinus rhythm. The mean heart rate was N/A beats per minute. Other EKG findings include: None. LEG MOVEMENT DATA The total PLMS were 0 with a resulting PLMS index of 0.00. Associated arousal with leg movement index was 0.0.   IMPRESSIONS - No significant obstructive sleep apnea occurred during this study. - No significant central sleep apnea occurred during this study.   Delano Metz, MD Diplomate, American Board of Sleep Medicine.

## 2015-12-05 ENCOUNTER — Other Ambulatory Visit (HOSPITAL_COMMUNITY): Payer: PPO

## 2015-12-05 ENCOUNTER — Other Ambulatory Visit (HOSPITAL_COMMUNITY): Payer: Self-pay | Admitting: Internal Medicine

## 2015-12-05 ENCOUNTER — Encounter (HOSPITAL_COMMUNITY): Payer: Self-pay

## 2015-12-05 ENCOUNTER — Ambulatory Visit (HOSPITAL_COMMUNITY)
Admission: RE | Admit: 2015-12-05 | Discharge: 2015-12-05 | Disposition: A | Discharge status: Home / Self Care | Payer: PPO | Source: Ambulatory Visit | Attending: Internal Medicine | Admitting: Internal Medicine

## 2015-12-05 DIAGNOSIS — N63 Unspecified lump in breast: Secondary | ICD-10-CM | POA: Insufficient documentation

## 2015-12-05 DIAGNOSIS — N6320 Unspecified lump in the left breast, unspecified quadrant: Secondary | ICD-10-CM

## 2015-12-05 DIAGNOSIS — N6092 Unspecified benign mammary dysplasia of left breast: Secondary | ICD-10-CM | POA: Insufficient documentation

## 2015-12-05 DIAGNOSIS — N644 Mastodynia: Secondary | ICD-10-CM

## 2015-12-05 DIAGNOSIS — Z853 Personal history of malignant neoplasm of breast: Secondary | ICD-10-CM | POA: Insufficient documentation

## 2015-12-05 MED ORDER — LIDOCAINE HCL (PF) 1 % IJ SOLN
INTRAMUSCULAR | Status: AC
  Filled 2015-12-05: qty 10

## 2015-12-30 ENCOUNTER — Other Ambulatory Visit: Payer: Self-pay | Admitting: Cardiovascular Disease

## 2016-01-04 ENCOUNTER — Ambulatory Visit (HOSPITAL_COMMUNITY): Payer: PPO | Attending: Pulmonary Disease | Admitting: Speech Pathology

## 2016-01-04 ENCOUNTER — Encounter (HOSPITAL_COMMUNITY): Payer: Self-pay | Admitting: Speech Pathology

## 2016-01-04 DIAGNOSIS — R499 Unspecified voice and resonance disorder: Secondary | ICD-10-CM | POA: Diagnosis not present

## 2016-01-04 NOTE — Patient Instructions (Signed)
Requested that pt. Complete the voice handicap index and complete voiceless "h" exercises 10x each - 3x/day

## 2016-01-04 NOTE — Therapy (Signed)
[facility] [place], [place], [address] Phone: [phone]   Fax:  [phone]  Speech Language Pathology Evaluation  Patient Details  Name: Amanda Palmer MRN: [id number] Date of Birth: [birth year] Referring Provider: Beryl Meager   Encounter Date: 01/04/2016      End of Session - 01/04/16 1847    Visit Number 1   Number of Visits 6   Date for SLP Re-Evaluation 03/05/16   Authorization Type [employer]    Authorization Time Period 01-04-2016-03-05-2016   SLP Start Time 1515   SLP Stop Time  1610   SLP Time Calculation (min) 55 min   Activity Tolerance Patient tolerated treatment well      Past Medical History:  Diagnosis Date  . Cancer ([place])    breast  . COPD (chronic obstructive pulmonary disease) ([place])   . Hypertension     Past Surgical History:  Procedure Laterality Date  . CARDIAC CATHETERIZATION    . MASTECTOMY     right   . ORIF ANKLE FRACTURE Left 06/01/2014   Procedure: OPEN REDUCTION INTERNAL FIXATION (ORIF) LEFT ANKLE FRACTURE;  Surgeon: Marybelle Killings, MD;  Location: [facility];  Service: Orthopedics;  Laterality: Left;  . ROTATOR CUFF REPAIR     left  . TOTAL HIP ARTHROPLASTY     right  . TOTAL KNEE ARTHROPLASTY     bilateral    There were no vitals filed for this visit.      Subjective Assessment - 01/04/16 1804    Subjective pt. states "I don't know what's wrong with my voice"    Patient is accompained by: Family member   Special Tests assessed ability to sustain vowel sounds    Currently in Pain? No/denies   Multiple Pain Sites No            SLP Evaluation [facility] - 01/04/16 1812      SLP Visit Information   SLP Received On 01/04/16   Referring Provider Beryl Meager    Onset Date                                                               08/16/15   Medical Diagnosis 82 y/o female referred by Dr. Luan Pulling for voice evaluation, past medical hx of breast CA and and lung  disease; She was seen by Dr. Rowe Clack  and clinical impressions noted dysphonia, bilateral vocal fold atrhphy, secondary muscle tension dysphonia, and  chronic cough/throat clearing ; [facility] [place] saw her as an outpatient and noted low speaking voice, moderate strain, mild roughness, and intemittent periods of diplophonia       Motor Speech   Overall Motor Speech Appears within functional limits for tasks assessed   Respiration Impaired   Level of Impairment Phrase   Phonation Low vocal intensity;Hoarse;Aphonic   Resonance Within functional limits   Articulation Within functional limitis   Motor Planning Witnin functional limits   Motor Speech Errors Not applicable     Plan   Duration Other (comment)  8 weeks             SLP Short Term Goals - 01/04/16 1819      SLP SHORT TERM GOAL #1   Title Pt. will sustain vocal sounds up  to 10 seconds without breaks in voice in 10/10 trials with min verval cues.    Baseline 4 seconds    Time 6   Period Weeks   Status New     SLP SHORT TERM GOAL #2   Title Pt. will demonstrate use and understanding of compensaoty strategies to make gains with vocal quality with 80% accuracy.    Baseline strained, harsh vocal quality.    Time 6   Period Weeks   Status New     SLP SHORT TERM GOAL #3   Title Pt. will complete exercises to increase breath suopport in order to improve sustained voice with 80% accuracy and min verbal cues.    Baseline clavicular breathing, appeared to have shallow inspiration    Time 4   Period Weeks   Status New     SLP SHORT TERM GOAL #4   Title Pt. will increase hydration to 48ounces or more per day.    Baseline up to 32 ounces    Time 4   Period Weeks   Status New     SLP SHORT TERM GOAL #5   Title Pt. will understand and  follow reflux precautions with 80% accuracy.    Baseline pt. demonstrates decreased understanding of reflux precautions.    Time 4   Period Weeks   Status New          SLP Long Term  Goals - 2016-01-09 1838      SLP LONG TERM GOAL #1   Title Pt. will consistently use home exercise program  to make gains with vocal quality.    Baseline pt. demonstrates limited understanding of home exercise program   Time 6   Period Weeks   Status New     SLP LONG TERM GOAL #2   Title Pt. will use leared strategies and techniques to make gains with sustained voicing during short conversations with minimal communication breakdowns.    Baseline frequent communication breakdowns during short sentences    Time 6   Period Weeks   Status New          Plan - January 09, 2016 1821    Clinical Impression Statement Pt. presents with harsh, raspy voice quality. She demonstrated consistent voice breaks with high "e" as well as voice glides. She denied shortness of breath during evaluation. She did indicate a strained feeling with high glides. She consumes 3-4 glasses of water a day and she does not consume alcohol. She sustained high "e" for 4 seconds and  for 7 seconds with exhale. Pt. presents with harsh, raspy voice quality. She demonstrated consistent voice breaks with high "e" as well as voice glides. ST recommends voice therapy to target vocal quality and sustained phonation for improved communication.        Patient will benefit from skilled therapeutic intervention in order to improve the following deficits and impairments:   Voice disturbance      G-Codes - 01-09-2016 1844    Functional Assessment Tool Used clinical judgement    Functional Limitations Voice   Voice Current Status [phone]) At least 60 percent but less than 80 percent impaired, limited or restricted   Voice Goal Status [id number]) At least 20 percent but less than 40 percent impaired, limited or restricted      Problem List Patient Active Problem List   Diagnosis Date Noted  . Upper airway cough syndrome 03/29/2015  . Dyspnea 02/23/2015  . Trimalleolar fracture of ankle, closed 05/31/2014  . Syncope and collapse  05/31/2014   . Scalp laceration 05/31/2014  . ARF (acute renal failure) ([place]) 05/31/2014  . Syncope 05/31/2014  . COPD (chronic obstructive pulmonary disease) ([place]) 05/22/2014  . COPD exacerbation ([place]) 05/22/2014  . HTN (hypertension) 05/22/2014  . Arrhythmia 05/17/2014    Luther Redo 01/04/2016, 7:06 PM  [facility] [address] [place], [place], [address] Phone: [phone]   Fax:  [phone]  Name: Amanda Palmer MRN: [id number] Date of Birth: 30-Aug-1933

## 2016-01-09 DIAGNOSIS — I739 Peripheral vascular disease, unspecified: Secondary | ICD-10-CM | POA: Diagnosis not present

## 2016-01-17 ENCOUNTER — Ambulatory Visit (HOSPITAL_COMMUNITY): Payer: PPO | Attending: Pulmonary Disease

## 2016-01-17 DIAGNOSIS — R499 Unspecified voice and resonance disorder: Secondary | ICD-10-CM | POA: Diagnosis present

## 2016-01-17 NOTE — Therapy (Signed)
[place] [facility] [place], [place], [address] Phone: [phone]   Fax:  [phone]  Speech Language Pathology Treatment  Patient Details  Name: Amanda Palmer MRN: [id number] Date of Birth: [birth year] Referring Provider: Beryl Meager   Encounter Date: 01/17/2016      End of Session - 01/17/16 1637    Visit Number 2   Number of Visits 6   Date for SLP Re-Evaluation 03/05/16   Authorization Type [employer]    Authorization Time Period 01-04-2016-03-05-2016   SLP Start Time 1432   SLP Stop Time  1510   SLP Time Calculation (min) 38 min   Activity Tolerance Patient tolerated treatment well      Past Medical History:  Diagnosis Date  . Cancer ([place])    breast  . COPD (chronic obstructive pulmonary disease) ([facility])   . Hypertension     Past Surgical History:  Procedure Laterality Date  . CARDIAC CATHETERIZATION    . MASTECTOMY     right   . ORIF ANKLE FRACTURE Left 06/01/2014   Procedure: OPEN REDUCTION INTERNAL FIXATION (ORIF) LEFT ANKLE FRACTURE;  Surgeon: Marybelle Killings, MD;  Location: [facility];  Service: Orthopedics;  Laterality: Left;  . ROTATOR CUFF REPAIR     left  . TOTAL HIP ARTHROPLASTY     right  . TOTAL KNEE ARTHROPLASTY     bilateral    There were no vitals filed for this visit.      Subjective Assessment - 01/17/16 1634    Subjective "It was difficult to sing in the choir"    Patient is accompained by: Family member   Currently in Pain? No/denies   Multiple Pain Sites No               ADULT SLP TREATMENT - 01/17/16 0001      General Information   Behavior/Cognition Cooperative   Patient Positioning Upright in chair   Oral care provided N/A     Pain Assessment   Pain Assessment No/denies pain     Progression Toward Goals   Progression toward goals Progressing toward goals            SLP Short Term Goals - 01/17/16 1645      SLP SHORT TERM GOAL #1   Title Pt. will  sustain vocal sounds up to 10 seconds without breaks in voice in 10/10 trials with min verval cues.    Baseline 4 seconds    Time 6   Period Weeks   Status New     SLP SHORT TERM GOAL #2   Title Pt. will demonstrate use and understanding of compensaoty strategies to make gains with vocal quality with 80% accuracy.    Baseline strained, harsh vocal quality.    Time 6   Period Weeks   Status New     SLP SHORT TERM GOAL #3   Title Pt. will complete exercises to increase breath suopport in order to improve sustained voice with 80% accuracy and min verbal cues.    Baseline clavicular breathing, appeared to have shallow inspiration    Time 4   Period Weeks   Status New     SLP SHORT TERM GOAL #4   Title Pt. will increase hydration to 48ounces or more per day.    Baseline up to 32 ounces    Time 4   Period Weeks   Status New     SLP SHORT TERM GOAL #5  Title Pt. will understand and  follow reflux precautions with 80% accuracy.    Baseline pt. demonstrates decreased understanding of reflux precautions.    Time 4   Period Weeks   Status New          SLP Long Term Goals - 01/17/16 1645      SLP LONG TERM GOAL #1   Title Pt. will consistently use home exercise program  to make gains with vocal quality.    Baseline pt. demonstrates limited understanding of home exercise program   Time 6   Period Weeks   Status New     SLP LONG TERM GOAL #2   Title Pt. will use leared strategies and techniques to make gains with sustained voicing during short conversations with minimal communication breakdowns.    Baseline frequent communication breakdowns during short sentences    Time 6   Period Weeks   Status New          Plan - 01/17/16 1637    Clinical Impression Statement Pt. demonstrated strong participation in [place] visit today. She sustaned "ah" using deep inhales with average of 5-6 seconds in 6 trials, answered pt. questions about vocal fold movement with phonation, targeted  breath support  and controlled airflow  using tissue with  exhale and she sustaned this for averge of 4 seconds, provided carryover exercises with  tissue use and with "ah" sound , pt. demonstrated understanding of activity.    Treatment/Interventions Patient/family education;Compensatory strategies;Other (comment);SLP instruction and feedback   Potential to Achieve Goals Good   SLP Home Exercise Plan sustanied "ah" -3 sets of 5, sustained airflow control using tissue    Consulted and Agree with Plan of Care Patient      Patient will benefit from skilled therapeutic intervention in order to improve the following deficits and impairments:   Voice disturbance    Problem List Patient Active Problem List   Diagnosis Date Noted  . Upper airway cough syndrome 03/29/2015  . Dyspnea 02/23/2015  . Trimalleolar fracture of ankle, closed 05/31/2014  . Syncope and collapse 05/31/2014  . Scalp laceration 05/31/2014  . ARF (acute renal failure) ([place]) 05/31/2014  . Syncope 05/31/2014  . COPD (chronic obstructive pulmonary disease) ([place]) 05/22/2014  . COPD exacerbation ([facility]) 05/22/2014  . HTN (hypertension) 05/22/2014  . Arrhythmia 05/17/2014    Luther Redo 01/17/2016, 4:51 PM  [facility] [address] [place], [place], [address] Phone: [phone]   Fax:  [phone]   Name: Amanda Palmer MRN: [id number] Date of Birth: 1933-08-18

## 2016-01-31 ENCOUNTER — Ambulatory Visit (HOSPITAL_COMMUNITY): Payer: PPO

## 2016-01-31 ENCOUNTER — Encounter (HOSPITAL_COMMUNITY): Payer: Self-pay

## 2016-01-31 DIAGNOSIS — R499 Unspecified voice and resonance disorder: Secondary | ICD-10-CM | POA: Diagnosis not present

## 2016-01-31 NOTE — Therapy (Addendum)
[place], [place], [address] Phone: [phone]   Fax:  [phone]  Speech Language Pathology Treatment  Patient Details  Name: Amanda Palmer MRN: [id number] Date of Birth: Jun 01, 1933 Referring Provider: Beryl Meager   Encounter Date: 01/31/2016      End of Session - 01/31/16 1659    Visit Number 3   Number of Visits 6   Date for SLP Re-Evaluation 03/05/16   Authorization Type [employer]    Authorization Time Period 01-04-2016-03-05-2016   SLP Start Time 1555   SLP Stop Time  1640   SLP Time Calculation (min) 45 min   Activity Tolerance Patient tolerated treatment well      Past Medical History:  Diagnosis Date  . Cancer ([place])    breast  . COPD (chronic obstructive pulmonary disease) ([facility])   . Hypertension     Past Surgical History:  Procedure Laterality Date  . CARDIAC CATHETERIZATION    . MASTECTOMY     right   . ORIF ANKLE FRACTURE Left 06/01/2014   Procedure: OPEN REDUCTION INTERNAL FIXATION (ORIF) LEFT ANKLE FRACTURE;  Surgeon: Marybelle Killings, MD;  Location: [facility];  Service: Orthopedics;  Laterality: Left;  . ROTATOR CUFF REPAIR     left  . TOTAL HIP ARTHROPLASTY     right  . TOTAL KNEE ARTHROPLASTY     bilateral    There were no vitals filed for this visit.      Subjective Assessment - 01/31/16 1656    Subjective "I see no difference in my voice"    Patient is accompained by: Family member   Special Tests provided pt. with VHI to complete at home    Currently in Pain? No/denies   Multiple Pain Sites No               ADULT SLP TREATMENT - 01/31/16 0001      General Information   Behavior/Cognition Cooperative   Patient Positioning Upright in chair   Oral care provided N/A   HPI 82 y/o female referred by Dr. Luan Pulling for voice evaluation, past medical hx of breast CA and and lung disease; She was seen by Dr. Rowe Clack and clinical impressions noted dysphonia,  bilateral vocal fold atrhphy, secondary muscle tension dysphonia, and chronic cough/throat clearing ; wake Porter saw her as an outpatient and noted low speaking voice, moderate strain, mild roughness, and intemittent periods of diplophonia      Treatment Provided   Treatment provided --  voice      Pain Assessment   Pain Assessment No/denies pain            SLP Short Term Goals - 01/31/16 1716      SLP SHORT TERM GOAL #1   Title Pt. will sustain vocal sounds up to 10 seconds without breaks in voice in 10/10 trials with min verval cues.    Baseline 4 seconds    Time 6   Period Weeks   Status New     SLP SHORT TERM GOAL #2   Title Pt. will demonstrate use and understanding of compensaoty strategies to make gains with vocal quality with 80% accuracy.    Baseline strained, harsh vocal quality.    Time 6   Period Weeks   Status New     SLP SHORT TERM GOAL #3   Title Pt. will complete exercises to increase breath suopport in order to improve sustained voice with 80% accuracy and  min verbal cues.    Baseline clavicular breathing, appeared to have shallow inspiration    Time 4   Period Weeks   Status New     SLP SHORT TERM GOAL #4   Title Pt. will increase hydration to 48ounces or more per day.    Baseline up to 32 ounces    Time 4   Period Weeks   Status New     SLP SHORT TERM GOAL #5   Title Pt. will understand and  follow reflux precautions with 80% accuracy.    Baseline pt. demonstrates decreased understanding of reflux precautions.    Time 4   Period Weeks   Status New          SLP Long Term Goals - 01/31/16 1716      SLP LONG TERM GOAL #1   Title Pt. will consistently use home exercise program  to make gains with vocal quality.    Baseline pt. demonstrates limited understanding of home exercise program   Time 6   Period Weeks   Status New     SLP LONG TERM GOAL #2   Title Pt. will use leared strategies and techniques to make gains with sustained  voicing during short conversations with minimal communication breakdowns.    Baseline frequent communication breakdowns during short sentences    Time 6   Period Weeks   Status New          Plan - 01/31/16 1700    Clinical Impression Statement Pt. was coopetative during treatment today. She sustained "ah" x5 for 9 seconds with visual cues for diaphragmatic breathing, which demonstrates progress compared to previous sessions. She demonstrated understanding of vocal quality techniques with no diffiuclty, however she states that she does not use these at home. Provided reflux precautions handout, and she states that  her reflux is mostly under control. Provided handout with strategies to manage vocal hygine at home - strongly recomended increasing liquid intake, as she states the she consumes 16 ounces every day . Targeted use of incentive spirometer to enhance breathing and talking coordination, she completed 10 repetitions and her averge range was 500-750 ml. Noted frequent clavicular breathing during conversational exchanges today, SLP had patient lay on her back with book on chest to provide more visual feedback for diaphragmatic breathing, which she completed x 15. Educated pt. to complete diaphragmatic breathing exercises in bed. Also, targeted "yawn-sigh" techique to relax vocal tract which she completed x 5-provided handout to complete this at home. Pt. continues to present with voice disturbance and requires ST intervention.      Speech Therapy Frequency Other (comment)  every other week    Treatment/Interventions Patient/family education;Compensatory strategies;Other (comment);SLP instruction and feedback   Potential to Achieve Goals Good   SLP Home Exercise Plan yawn-sigh, diaphragmatic breathing, use vocal hygine and reflux precautons handout, continue to use incentive spirometer    Consulted and Agree with Plan of Care Patient      Patient will benefit from skilled therapeutic  intervention in order to improve the following deficits and impairments:   Voice disturbance    Problem List Patient Active Problem List   Diagnosis Date Noted  . Upper airway cough syndrome 03/29/2015  . Dyspnea 02/23/2015  . Trimalleolar fracture of ankle, closed 05/31/2014  . Syncope and collapse 05/31/2014  . Scalp laceration 05/31/2014  . ARF (acute renal failure) ([facility]) 05/31/2014  . Syncope 05/31/2014  . COPD (chronic obstructive pulmonary disease) ([facility]) 05/22/2014  . COPD exacerbation (  [facility]) 05/22/2014  . HTN (hypertension) 05/22/2014  . Arrhythmia 05/17/2014    Thank you,   Renato Gails. Megan Salon Garner, CCC-SLP  Speech-Language Pathologist [phone]     Luther Redo 01/31/2016, 5:19 PM  [facility] [address] [place], [place], [address] Phone: [phone]   Fax:  [phone]   Name: Amanda Palmer MRN: [id number] Date of Birth: 11-09-1933

## 2016-02-12 ENCOUNTER — Telehealth (HOSPITAL_COMMUNITY): Payer: Self-pay

## 2016-02-12 NOTE — Telephone Encounter (Signed)
Called pt to reschedule her and she requested to be d/c. Pt feels these treatments are not helping her and it was notthing that we did.She just wanted to try this and has decided not to return at this time. NF 02/12/16

## 2016-03-19 DIAGNOSIS — I739 Peripheral vascular disease, unspecified: Secondary | ICD-10-CM | POA: Diagnosis not present

## 2016-03-22 ENCOUNTER — Encounter: Payer: Self-pay | Admitting: *Deleted

## 2016-03-25 ENCOUNTER — Encounter: Payer: Self-pay | Admitting: Cardiovascular Disease

## 2016-03-25 ENCOUNTER — Ambulatory Visit (INDEPENDENT_AMBULATORY_CARE_PROVIDER_SITE_OTHER): Payer: Medicare Other | Admitting: Cardiovascular Disease

## 2016-03-25 VITALS — BP 148/69 | HR 64 | Ht 64.0 in | Wt 153.0 lb

## 2016-03-25 DIAGNOSIS — I4819 Other persistent atrial fibrillation: Secondary | ICD-10-CM

## 2016-03-25 DIAGNOSIS — I10 Essential (primary) hypertension: Secondary | ICD-10-CM

## 2016-03-25 DIAGNOSIS — I481 Persistent atrial fibrillation: Secondary | ICD-10-CM

## 2016-03-25 DIAGNOSIS — E782 Mixed hyperlipidemia: Secondary | ICD-10-CM

## 2016-03-25 NOTE — Progress Notes (Signed)
SUBJECTIVE: The patient returns for follow-up of persistent atrial fibrillation.   Echocardiogram on 06/01/14 demonstrated normal left ventricular systolic function, EF 0000000, normal regional wall motion, grade 1 diastolic dysfunction, and mild left atrial dilatation.  Denies chest pain and palpitations. Has chronic exertional dyspnea which is mild and has not gotten any worse.   Review of Systems: As per "subjective", otherwise negative.  Allergies  Allergen Reactions  . Morphine And Related Nausea And Vomiting  . Sulfa Antibiotics Nausea And Vomiting  . Penicillins Rash    Current Outpatient Prescriptions  Medication Sig Dispense Refill  . albuterol (PROVENTIL HFA;VENTOLIN HFA) 108 (90 BASE) MCG/ACT inhaler Inhale 1-2 puffs into the lungs every 6 (six) hours as needed for wheezing or shortness of breath.    Marland Kitchen atorvastatin (LIPITOR) 20 MG tablet Take 20 mg by mouth every evening.     . Calcium-Vitamin D-Vitamin K 500-500-40 MG-UNT-MCG CHEW Chew 1 tablet by mouth daily.    . clonazePAM (KLONOPIN) 1 MG tablet Take 1 tablet (1 mg total) by mouth at bedtime. 30 tablet 0  . ELIQUIS 5 MG TABS tablet TAKE ONE TABLET BY MOUTH TWICE DAILY 60 tablet 6  . famotidine (PEPCID) 20 MG tablet One at bedtime 30 tablet 2  . ferrous sulfate 325 (65 FE) MG tablet Take 325 mg by mouth 2 (two) times daily with a meal.    . gabapentin (NEURONTIN) 300 MG capsule Take 300 mg by mouth at bedtime.     Marland Kitchen HYDROcodone-acetaminophen (NORCO/VICODIN) 5-325 MG per tablet Take 1 tablet by mouth every 6 (six) hours as needed for moderate pain. 120 tablet 0  . levocetirizine (XYZAL) 5 MG tablet Take 1 tablet by mouth daily.    . metoprolol tartrate (LOPRESSOR) 25 MG tablet Take 1 tablet (25 mg total) by mouth 2 (two) times daily. 60 tablet 6  . montelukast (SINGULAIR) 10 MG tablet Take 10 mg by mouth daily.    . Multiple Vitamin (MULTIVITAMIN WITH MINERALS) TABS tablet Take 1 tablet by mouth daily.    .  nitroGLYCERIN (NITROSTAT) 0.4 MG SL tablet Place 1 tablet (0.4 mg total) under the tongue every 5 (five) minutes as needed for chest pain. 25 tablet 3  . OXYGEN Inhale 2 L into the lungs at bedtime.    . pantoprazole (PROTONIX) 40 MG tablet Take 1 tablet (40 mg total) by mouth daily. Take 30-60 min before first meal of the day 30 tablet 2  . traMADol (ULTRAM) 50 MG tablet Take 1 tablet by mouth 2 (two) times daily as needed.    . triamcinolone cream (KENALOG) 0.1 % Apply 1 application topically 2 (two) times daily as needed.    . venlafaxine (EFFEXOR) 75 MG tablet Take 75 mg by mouth 2 (two) times daily with a meal.      No current facility-administered medications for this visit.     Past Medical History:  Diagnosis Date  . Cancer ([facility])    breast  . COPD (chronic obstructive pulmonary disease) ([facility])   . Hypertension     Past Surgical History:  Procedure Laterality Date  . CARDIAC CATHETERIZATION    . MASTECTOMY     right   . ORIF ANKLE FRACTURE Left 06/01/2014   Procedure: OPEN REDUCTION INTERNAL FIXATION (ORIF) LEFT ANKLE FRACTURE;  Surgeon: Marybelle Killings, MD;  Location: [facility];  Service: Orthopedics;  Laterality: Left;  . ROTATOR CUFF REPAIR     left  . TOTAL HIP ARTHROPLASTY  right  . TOTAL KNEE ARTHROPLASTY     bilateral    Social History   Social History  . Marital status: Widowed    Spouse name: N/A  . Number of children: N/A  . Years of education: N/A   Occupational History  . Not on file.   Social History Main Topics  . Smoking status: Never Smoker  . Smokeless tobacco: Never Used  . Alcohol use No  . Drug use: No  . Sexual activity: Not on file   Other Topics Concern  . Not on file   Social History Narrative  . No narrative on file     Vitals:   03/25/16 1359  BP: (!) 148/69  Pulse: 64  SpO2: 97%  Weight: 153 lb (69.4 kg)  Height: 5\' 4"  (1.626 m)    PHYSICAL EXAM General: NAD Neck: No JVD, no thyromegaly or thyroid nodule.  Lungs:  Clear to auscultation bilaterally with normal respiratory effort. CV: Nondisplaced PMI. Regular rate and mostly regular rhythm with premature contractions appreciated, normal S1/S2, no S3/S4, soft 1/6 holosystolic murmur along the left lower sternal border. No pedal edema. Abdomen: Soft, nontender,no distention.  Skin: Intact without lesions or rashes.  Neurologic: Alert and oriented x 3.  Psych: Normal affect. Extremities: No clubbing or cyanosis.  HEENT: Normal.    ECG: Most recent ECG reviewed.      ASSESSMENT AND PLAN: 1. Persistent atrial fibrillation: Symptomatically stable on metoprolol 25 mg twice daily. CHADSVASC 6 thus high thromboembolic risk. Continue apixaban 5 mg bid.  2. Essential HTN: Mildly elevated today, normal at last visit. Will monitor. No changes.  3. Hyperlipidemia: Continue Lipitor 20 mg.  Dispo: fu 1 yr   Kate Sable, M.D., F.A.C.C.

## 2016-03-25 NOTE — Patient Instructions (Signed)
Your physician wants you to follow-up in: 1 YEAR WITH DR KONESWARAN You will receive a reminder letter in the mail two months in advance. If you don't receive a letter, please call our office to schedule the follow-up appointment.  Your physician recommends that you continue on your current medications as directed. Please refer to the Current Medication list given to you today.  Thank you for choosing [facility]!!    

## 2016-04-10 DIAGNOSIS — Z961 Presence of intraocular lens: Secondary | ICD-10-CM | POA: Insufficient documentation

## 2016-04-10 DIAGNOSIS — H43813 Vitreous degeneration, bilateral: Secondary | ICD-10-CM | POA: Insufficient documentation

## 2016-04-16 DIAGNOSIS — M47816 Spondylosis without myelopathy or radiculopathy, lumbar region: Secondary | ICD-10-CM | POA: Diagnosis not present

## 2016-04-16 DIAGNOSIS — M4326 Fusion of spine, lumbar region: Secondary | ICD-10-CM | POA: Diagnosis not present

## 2016-04-22 ENCOUNTER — Other Ambulatory Visit: Payer: Self-pay | Admitting: Cardiovascular Disease

## 2016-04-23 DIAGNOSIS — I482 Chronic atrial fibrillation: Secondary | ICD-10-CM | POA: Diagnosis not present

## 2016-04-23 DIAGNOSIS — G459 Transient cerebral ischemic attack, unspecified: Secondary | ICD-10-CM | POA: Diagnosis not present

## 2016-04-23 DIAGNOSIS — E119 Type 2 diabetes mellitus without complications: Secondary | ICD-10-CM | POA: Diagnosis not present

## 2016-04-23 DIAGNOSIS — J449 Chronic obstructive pulmonary disease, unspecified: Secondary | ICD-10-CM | POA: Diagnosis not present

## 2016-04-23 DIAGNOSIS — I10 Essential (primary) hypertension: Secondary | ICD-10-CM | POA: Diagnosis not present

## 2016-04-23 DIAGNOSIS — E039 Hypothyroidism, unspecified: Secondary | ICD-10-CM | POA: Diagnosis not present

## 2016-04-23 DIAGNOSIS — E782 Mixed hyperlipidemia: Secondary | ICD-10-CM | POA: Diagnosis not present

## 2016-04-23 DIAGNOSIS — E785 Hyperlipidemia, unspecified: Secondary | ICD-10-CM | POA: Diagnosis not present

## 2016-04-25 DIAGNOSIS — D509 Iron deficiency anemia, unspecified: Secondary | ICD-10-CM | POA: Diagnosis not present

## 2016-04-25 DIAGNOSIS — J449 Chronic obstructive pulmonary disease, unspecified: Secondary | ICD-10-CM | POA: Diagnosis not present

## 2016-04-25 DIAGNOSIS — I482 Chronic atrial fibrillation: Secondary | ICD-10-CM | POA: Diagnosis not present

## 2016-04-25 DIAGNOSIS — J383 Other diseases of vocal cords: Secondary | ICD-10-CM | POA: Diagnosis not present

## 2016-04-25 DIAGNOSIS — Z6827 Body mass index (BMI) 27.0-27.9, adult: Secondary | ICD-10-CM | POA: Diagnosis not present

## 2016-04-25 DIAGNOSIS — K219 Gastro-esophageal reflux disease without esophagitis: Secondary | ICD-10-CM | POA: Diagnosis not present

## 2016-04-25 DIAGNOSIS — Z9010 Acquired absence of unspecified breast and nipple: Secondary | ICD-10-CM | POA: Diagnosis not present

## 2016-04-25 DIAGNOSIS — I481 Persistent atrial fibrillation: Secondary | ICD-10-CM | POA: Diagnosis not present

## 2016-04-25 DIAGNOSIS — I10 Essential (primary) hypertension: Secondary | ICD-10-CM | POA: Diagnosis not present

## 2016-04-25 DIAGNOSIS — E782 Mixed hyperlipidemia: Secondary | ICD-10-CM | POA: Diagnosis not present

## 2016-05-22 DIAGNOSIS — T8549XA Other mechanical complication of breast prosthesis and implant, initial encounter: Secondary | ICD-10-CM | POA: Diagnosis not present

## 2016-05-23 ENCOUNTER — Telehealth: Payer: Self-pay | Admitting: Cardiovascular Disease

## 2016-05-23 ENCOUNTER — Encounter (HOSPITAL_BASED_OUTPATIENT_CLINIC_OR_DEPARTMENT_OTHER): Payer: Self-pay | Admitting: *Deleted

## 2016-05-23 NOTE — Telephone Encounter (Signed)
Amanda Palmer called stating that she is having surgery on Monday 05-27-16 by Dr. Cristine Polio . He ask patient to contact Dr. Bronson Ing about her going off of the  Eliquis .

## 2016-05-23 NOTE — Telephone Encounter (Signed)
What type of surgery? Can hold for 2 doses day before and morning of surgery.

## 2016-05-24 NOTE — Telephone Encounter (Addendum)
Patient notified.  Stated that they are removing a saline breast implant & replacing with another one.

## 2016-05-27 ENCOUNTER — Ambulatory Visit (HOSPITAL_BASED_OUTPATIENT_CLINIC_OR_DEPARTMENT_OTHER): Payer: Medicare Other | Admitting: Anesthesiology

## 2016-05-27 ENCOUNTER — Encounter (HOSPITAL_BASED_OUTPATIENT_CLINIC_OR_DEPARTMENT_OTHER)
Admission: RE | Disposition: A | Discharge status: Home / Self Care | Payer: Self-pay | Source: Ambulatory Visit | Attending: Specialist

## 2016-05-27 ENCOUNTER — Ambulatory Visit (HOSPITAL_BASED_OUTPATIENT_CLINIC_OR_DEPARTMENT_OTHER)
Admission: RE | Admit: 2016-05-27 | Discharge: 2016-05-27 | Disposition: A | Discharge status: Home / Self Care | Payer: Medicare Other | Source: Ambulatory Visit | Attending: Specialist | Admitting: Specialist

## 2016-05-27 ENCOUNTER — Encounter (HOSPITAL_BASED_OUTPATIENT_CLINIC_OR_DEPARTMENT_OTHER): Payer: Self-pay | Admitting: Anesthesiology

## 2016-05-27 DIAGNOSIS — C50111 Malignant neoplasm of central portion of right female breast: Secondary | ICD-10-CM | POA: Diagnosis not present

## 2016-05-27 DIAGNOSIS — I10 Essential (primary) hypertension: Secondary | ICD-10-CM | POA: Diagnosis not present

## 2016-05-27 DIAGNOSIS — Z96653 Presence of artificial knee joint, bilateral: Secondary | ICD-10-CM | POA: Insufficient documentation

## 2016-05-27 DIAGNOSIS — J449 Chronic obstructive pulmonary disease, unspecified: Secondary | ICD-10-CM | POA: Diagnosis not present

## 2016-05-27 DIAGNOSIS — Y838 Other surgical procedures as the cause of abnormal reaction of the patient, or of later complication, without mention of misadventure at the time of the procedure: Secondary | ICD-10-CM | POA: Diagnosis not present

## 2016-05-27 DIAGNOSIS — Z421 Encounter for breast reconstruction following mastectomy: Secondary | ICD-10-CM | POA: Diagnosis not present

## 2016-05-27 DIAGNOSIS — Z45811 Encounter for adjustment or removal of right breast implant: Secondary | ICD-10-CM | POA: Diagnosis not present

## 2016-05-27 DIAGNOSIS — Z9011 Acquired absence of right breast and nipple: Secondary | ICD-10-CM | POA: Insufficient documentation

## 2016-05-27 DIAGNOSIS — T8549XA Other mechanical complication of breast prosthesis and implant, initial encounter: Secondary | ICD-10-CM | POA: Diagnosis present

## 2016-05-27 DIAGNOSIS — Z853 Personal history of malignant neoplasm of breast: Secondary | ICD-10-CM | POA: Insufficient documentation

## 2016-05-27 DIAGNOSIS — R55 Syncope and collapse: Secondary | ICD-10-CM | POA: Diagnosis not present

## 2016-05-27 HISTORY — DX: Anxiety disorder, unspecified: F41.9

## 2016-05-27 HISTORY — DX: Major depressive disorder, single episode, unspecified: F32.9

## 2016-05-27 HISTORY — DX: Depression, unspecified: F32.A

## 2016-05-27 HISTORY — PX: BREAST CAPSULECTOMY WITH IMPLANT EXCHANGE: SHX5592

## 2016-05-27 SURGERY — CAPSULECTOMY, BREAST, WITH REPLACEMENT OF IMPLANT
Anesthesia: General | Site: Breast | Laterality: Right

## 2016-05-27 MED ORDER — GLYCOPYRROLATE 0.2 MG/ML IJ SOLN
INTRAMUSCULAR | Status: DC | PRN
  Administered 2016-05-27: 0.1 mg via INTRAVENOUS
  Administered 2016-05-27: 0.2 mg via INTRAVENOUS

## 2016-05-27 MED ORDER — SCOPOLAMINE 1 MG/3DAYS TD PT72: 1.0000 | MEDICATED_PATCH | Freq: Once | TRANSDERMAL | Status: DC | PRN

## 2016-05-27 MED ORDER — FENTANYL CITRATE (PF) 100 MCG/2ML IJ SOLN
25.0000 ug | INTRAMUSCULAR | Status: DC | PRN
  Administered 2016-05-27: 25 ug via INTRAVENOUS

## 2016-05-27 MED ORDER — FENTANYL CITRATE (PF) 100 MCG/2ML IJ SOLN
INTRAMUSCULAR | Status: DC | PRN
  Administered 2016-05-27 (×2): 50 ug via INTRAVENOUS

## 2016-05-27 MED ORDER — PROPOFOL 10 MG/ML IV BOLUS
INTRAVENOUS | Status: AC
  Filled 2016-05-27: qty 20

## 2016-05-27 MED ORDER — ONDANSETRON HCL 4 MG/2ML IJ SOLN
INTRAMUSCULAR | Status: AC
  Filled 2016-05-27: qty 2

## 2016-05-27 MED ORDER — ONDANSETRON HCL 4 MG/2ML IJ SOLN
INTRAMUSCULAR | Status: DC | PRN
  Administered 2016-05-27: 4 mg via INTRAVENOUS

## 2016-05-27 MED ORDER — CEFAZOLIN SODIUM-DEXTROSE 2-3 GM-% IV SOLR
INTRAVENOUS | Status: DC | PRN
  Administered 2016-05-27: 2 g via INTRAVENOUS

## 2016-05-27 MED ORDER — LIDOCAINE HCL (CARDIAC) 20 MG/ML IV SOLN
INTRAVENOUS | Status: DC | PRN
  Administered 2016-05-27: 30 mg via INTRAVENOUS

## 2016-05-27 MED ORDER — SODIUM CHLORIDE 0.9 % IJ SOLN
INTRAMUSCULAR | Status: AC
  Filled 2016-05-27: qty 10

## 2016-05-27 MED ORDER — CEFAZOLIN SODIUM 1 G IJ SOLR
INTRAMUSCULAR | Status: AC
  Filled 2016-05-27: qty 10

## 2016-05-27 MED ORDER — FENTANYL CITRATE (PF) 100 MCG/2ML IJ SOLN
INTRAMUSCULAR | Status: AC
  Filled 2016-05-27: qty 2

## 2016-05-27 MED ORDER — FENTANYL CITRATE (PF) 100 MCG/2ML IJ SOLN: 50.0000 ug | INTRAMUSCULAR | Status: DC | PRN

## 2016-05-27 MED ORDER — DEXAMETHASONE SODIUM PHOSPHATE 4 MG/ML IJ SOLN
INTRAMUSCULAR | Status: DC | PRN
  Administered 2016-05-27: 10 mg via INTRAVENOUS

## 2016-05-27 MED ORDER — PROPOFOL 10 MG/ML IV BOLUS
INTRAVENOUS | Status: DC | PRN
  Administered 2016-05-27: 150 mg via INTRAVENOUS

## 2016-05-27 MED ORDER — SODIUM CHLORIDE 0.9 % IV SOLN
INTRAVENOUS | Status: DC | PRN
  Administered 2016-05-27: 10:00:00

## 2016-05-27 MED ORDER — DEXAMETHASONE SODIUM PHOSPHATE 10 MG/ML IJ SOLN
INTRAMUSCULAR | Status: AC
  Filled 2016-05-27: qty 1

## 2016-05-27 MED ORDER — MIDAZOLAM HCL 2 MG/2ML IJ SOLN: 1.0000 mg | INTRAMUSCULAR | Status: DC | PRN

## 2016-05-27 MED ORDER — CEFAZOLIN SODIUM 1 G IJ SOLR
INTRAMUSCULAR | Status: AC
  Filled 2016-05-27: qty 20

## 2016-05-27 MED ORDER — LIDOCAINE 2% (20 MG/ML) 5 ML SYRINGE
INTRAMUSCULAR | Status: AC
  Filled 2016-05-27: qty 5

## 2016-05-27 MED ORDER — LACTATED RINGERS IV SOLN
INTRAVENOUS | Status: DC
  Administered 2016-05-27: 09:00:00 via INTRAVENOUS

## 2016-05-27 SURGICAL SUPPLY — 60 items
BAG DECANTER FOR FLEXI CONT (MISCELLANEOUS) ×3 IMPLANT
BENZOIN TINCTURE PRP APPL 2/3 (GAUZE/BANDAGES/DRESSINGS) ×3 IMPLANT
BLADE KNIFE PERSONA 10 (BLADE) IMPLANT
BLADE KNIFE PERSONA 15 (BLADE) ×3 IMPLANT
CANISTER SUCT 1200ML W/VALVE (MISCELLANEOUS) ×3 IMPLANT
COVER BACK TABLE 60X90IN (DRAPES) ×3 IMPLANT
COVER MAYO STAND STRL (DRAPES) ×3 IMPLANT
DECANTER SPIKE VIAL GLASS SM (MISCELLANEOUS) ×3 IMPLANT
DRAIN CHANNEL 10M FLAT 3/4 FLT (DRAIN) IMPLANT
DRAIN PENROSE 1/4X12 LTX STRL (WOUND CARE) IMPLANT
DRAPE LAPAROSCOPIC ABDOMINAL (DRAPES) ×3 IMPLANT
DRSG PAD ABDOMINAL 8X10 ST (GAUZE/BANDAGES/DRESSINGS) ×6 IMPLANT
ELECT BLADE 6.5 .24CM SHAFT (ELECTRODE) IMPLANT
ELECT REM PT RETURN 9FT ADLT (ELECTROSURGICAL) ×3
ELECTRODE REM PT RTRN 9FT ADLT (ELECTROSURGICAL) ×1 IMPLANT
EVACUATOR SILICONE 100CC (DRAIN) IMPLANT
GAUZE SPONGE 4X4 12PLY STRL (GAUZE/BANDAGES/DRESSINGS) ×3 IMPLANT
GAUZE XEROFORM 5X9 LF (GAUZE/BANDAGES/DRESSINGS) ×3 IMPLANT
GLOVE BIO SURGEON STRL SZ 6.5 (GLOVE) ×2 IMPLANT
GLOVE BIO SURGEONS STRL SZ 6.5 (GLOVE) ×1
GLOVE BIOGEL M STRL SZ7.5 (GLOVE) ×3 IMPLANT
GLOVE BIOGEL PI IND STRL 8 (GLOVE) ×1 IMPLANT
GLOVE BIOGEL PI INDICATOR 8 (GLOVE) ×2
GLOVE ECLIPSE 7.0 STRL STRAW (GLOVE) ×3 IMPLANT
GOWN STRL REUS W/TWL XL LVL3 (GOWN DISPOSABLE) ×6 IMPLANT
IMPLANT BREAST SALINE 275CC (Breast) ×3 IMPLANT
IV NS 500ML (IV SOLUTION) ×2
IV NS 500ML BAXH (IV SOLUTION) ×1 IMPLANT
KIT FILL SYSTEM UNIVERSAL (SET/KITS/TRAYS/PACK) IMPLANT
MARKER SKIN DUAL TIP RULER LAB (MISCELLANEOUS) IMPLANT
NDL SAFETY ECLIPSE 18X1.5 (NEEDLE) ×2 IMPLANT
NEEDLE HYPO 18GX1.5 SHARP (NEEDLE) ×4
NEEDLE HYPO 25X1 1.5 SAFETY (NEEDLE) IMPLANT
NEEDLE SPNL 18GX3.5 QUINCKE PK (NEEDLE) IMPLANT
NS IRRIG 1000ML POUR BTL (IV SOLUTION) ×3 IMPLANT
PACK BASIN DAY SURGERY FS (CUSTOM PROCEDURE TRAY) ×3 IMPLANT
PEN SKIN MARKING BROAD TIP (MISCELLANEOUS) IMPLANT
PIN SAFETY STERILE (MISCELLANEOUS) IMPLANT
SLEEVE SCD COMPRESS KNEE MED (MISCELLANEOUS) ×3 IMPLANT
SPONGE LAP 18X18 X RAY DECT (DISPOSABLE) ×6 IMPLANT
STRIP SUTURE WOUND CLOSURE 1/2 (SUTURE) ×3 IMPLANT
SUT MNCRL AB 3-0 PS2 18 (SUTURE) ×6 IMPLANT
SUT MON AB 2-0 CT1 36 (SUTURE) IMPLANT
SUT MON AB 5-0 PS2 18 (SUTURE) IMPLANT
SUT PROLENE 3 0 PS 2 (SUTURE) IMPLANT
SYR 20CC LL (SYRINGE) ×3 IMPLANT
SYR 50ML LL SCALE MARK (SYRINGE) ×3 IMPLANT
SYR BULB IRRIGATION 50ML (SYRINGE) IMPLANT
SYR CONTROL 10ML LL (SYRINGE) ×3 IMPLANT
TAPE HYPAFIX 6 X30' (GAUZE/BANDAGES/DRESSINGS) ×1
TAPE HYPAFIX 6X30 (GAUZE/BANDAGES/DRESSINGS) ×2 IMPLANT
TAPE MEASURE 72IN RETRACT (INSTRUMENTS)
TAPE MEASURE LINEN 72IN RETRCT (INSTRUMENTS) IMPLANT
TOWEL OR 17X24 6PK STRL BLUE (TOWEL DISPOSABLE) ×6 IMPLANT
TRAY DSU PREP LF (CUSTOM PROCEDURE TRAY) ×3 IMPLANT
TUBE CONNECTING 20'X1/4 (TUBING) ×1
TUBE CONNECTING 20X1/4 (TUBING) ×2 IMPLANT
UNDERPAD 30X30 (UNDERPADS AND DIAPERS) ×6 IMPLANT
VAC PENCILS W/TUBING CLEAR (MISCELLANEOUS) ×3 IMPLANT
YANKAUER SUCT BULB TIP NO VENT (SUCTIONS) ×3 IMPLANT

## 2016-05-27 NOTE — Op Note (Signed)
NAME:  Amanda Palmer, Amanda Palmer                  ACCOUNT NO.:  MEDICAL RECORD NO.:  [id number]  LOCATION:                                 FACILITY:  PHYSICIAN:  Berneta Sages L. Amaria Mundorf, M.D.DATE OF BIRTH:  02/17/1934  DATE OF PROCEDURE:  05/27/2016 DATE OF DISCHARGE:                              OPERATIVE REPORT   INDICATIONS:  An 83 year old lady who is status post right breast reconstructions in the past, has done well.  Has implant rupture on the right side at this point.  Plan of action, limited capsulectomy with implant exchanges and replacement.  ANESTHESIA:  General.  DESCRIPTION OF PROCEDURE:  Preoperatively, the patient underwent drawings for the procedure.  She underwent general anesthesia, intubated orally, prep was done to the chest and breast areas in routine fashion using Hibiclens soap and solution walled off with sterile towels and drapes so as to make a sterile field.  A right inframammary incision was made in the fold, carried down through skin, subcutaneous tissue #15 blade.  The underlying pectoralis major muscle, the muscle was divided in direction of its fibers __________.  Capsulotomies were fashioned limited in the __________ aspect of the area.  After proper hemostasis, the implant was removed, ruptured, and irrigation done.  Interval breast exam was done showing no masses or palpable masses.  Next, the area was replaced with a Mentor implant style 2600, added 275 mL to that, lot #[id number].  Good symmetry as compared to the left side. The muscle was then repaired back with 3-0 Monocryl, subcutaneous tissue with 3-0 Monocryl, then the skin edge reapproximated with a running subcuticular stitch of 3-0 Monocryl.  Steri-Strips and soft dressing were applied on all areas.  She withstood the procedures very well and was taken to recovery in excellent condition.  ESTIMATED BLOOD LOSS:  Less than 60 mL.  COMPLICATIONS:  None.     [name],  M.D.     [name]  D:  05/27/2016  T:  05/27/2016  Job:  [id number]

## 2016-05-27 NOTE — Anesthesia Preprocedure Evaluation (Signed)
Anesthesia Evaluation  Patient identified by MRN, date of birth, ID band Patient awake    Reviewed: Allergy & Precautions, NPO status , Patient's Chart, lab work & pertinent test results  Airway Mallampati: II  TM Distance: <3 FB Neck ROM: Full    Dental  (+) Teeth Intact   Pulmonary COPD,    breath sounds clear to auscultation       Cardiovascular hypertension, + dysrhythmias  Rhythm:Irregular Rate:Normal     Neuro/Psych    GI/Hepatic negative GI ROS, Neg liver ROS,   Endo/Other    Renal/GU Renal InsufficiencyRenal disease     Musculoskeletal   Abdominal   Peds  Hematology negative hematology ROS (+)   Anesthesia Other Findings   Reproductive/Obstetrics                             Anesthesia Physical Anesthesia Plan  ASA: III  Anesthesia Plan: General   Post-op Pain Management:    Induction: Intravenous  Airway Management Planned: LMA  Additional Equipment:   Intra-op Plan:   Post-operative Plan: Extubation in OR  Informed Consent: I have reviewed the patients History and Physical, chart, labs and discussed the procedure including the risks, benefits and alternatives for the proposed anesthesia with the patient or authorized representative who has indicated his/her understanding and acceptance.   Dental advisory given  Plan Discussed with: CRNA  Anesthesia Plan Comments:         Anesthesia Quick Evaluation

## 2016-05-27 NOTE — Brief Op Note (Signed)
05/27/2016  11:07 AM  PATIENT:  Amanda Palmer  83 y.o. female  PRE-OPERATIVE DIAGNOSIS:  Reconstuction from breast cancer  POST-OPERATIVE DIAGNOSIS:  Reconstuction from breast cancer  PROCEDURE:  Procedure(s): REMOVAL AND REPLACEMENT OF RIGHT BREAST IMPLANT AND BREAST CAPSULECTOMY (Right)  SURGEON:  Surgeon(s) and Role:    * Cristine Polio, MD - Primary  PHYSICIAN ASSISTANT:   ASSISTANTS: none   ANESTHESIA:   general  EBL:  Total I/O In: 500 [I.V.:500] Out: 20 [Blood:20]  BLOOD ADMINISTERED:none  DRAINS: none   LOCAL MEDICATIONS USED:  LIDOCAINE   SPECIMEN:  Excision  DISPOSITION OF SPECIMEN:  PATHOLOGY  COUNTS:  YES  TOURNIQUET:  * No tourniquets in log *  DICTATION: .Other Dictation: Dictation Number [id number]  PLAN OF CARE: Discharge to home after PACU  PATIENT DISPOSITION:  PACU - hemodynamically stable.   Delay start of Pharmacological VTE agent (>24hrs) due to surgical blood loss or risk of bleeding: yes

## 2016-05-27 NOTE — Anesthesia Procedure Notes (Signed)
Procedure Name: LMA Insertion Date/Time: 05/27/2016 10:31 AM Performed by: Toula Moos L Pre-anesthesia Checklist: Patient identified, Emergency Drugs available, Suction available, Patient being monitored and Timeout performed Patient Re-evaluated:Patient Re-evaluated prior to inductionOxygen Delivery Method: Circle system utilized Preoxygenation: Pre-oxygenation with 100% oxygen Intubation Type: IV induction Ventilation: Mask ventilation without difficulty LMA: LMA inserted LMA Size: 4.0 Number of attempts: 1 Airway Equipment and Method: Bite block Placement Confirmation: positive ETCO2 Tube secured with: Tape Dental Injury: Teeth and Oropharynx as per pre-operative assessment

## 2016-05-27 NOTE — H&P (Signed)
Amanda Palmer is an 83 y.o. female.   Chief Complaint:rupture breast implant HPI: Reduction in size and rupture  Past Medical History:  Diagnosis Date  . Anxiety   . Cancer ([place])    breast  . COPD (chronic obstructive pulmonary disease) ([place])   . Depression   . Hypertension     Past Surgical History:  Procedure Laterality Date  . CARDIAC CATHETERIZATION    . MASTECTOMY     right   . ORIF ANKLE FRACTURE Left 06/01/2014   Procedure: OPEN REDUCTION INTERNAL FIXATION (ORIF) LEFT ANKLE FRACTURE;  Surgeon: Marybelle Killings, MD;  Location: [facility];  Service: Orthopedics;  Laterality: Left;  . ROTATOR CUFF REPAIR     left  . TOTAL HIP ARTHROPLASTY     right  . TOTAL KNEE ARTHROPLASTY     bilateral    Family History  Problem Relation Age of Onset  . Heart disease Brother   . Heart disease Sister   . Breast cancer Sister   . Leukemia Brother    Social History:  reports that she has never smoked. She has never used smokeless tobacco. She reports that she does not drink alcohol or use drugs.  Allergies:  Allergies  Allergen Reactions  . Morphine And Related Nausea And Vomiting  . Sulfa Antibiotics Nausea And Vomiting  . Penicillins Rash    No prescriptions prior to admission.    No results found for this or any previous visit (from the past 48 hour(s)). No results found.  Review of Systems  Constitutional: Negative.   HENT: Negative.   Eyes: Negative.   Cardiovascular: Negative.   Gastrointestinal: Negative.   Genitourinary: Negative.   Musculoskeletal: Negative.   Skin: Negative.   Neurological: Negative.   Endo/Heme/Allergies: Negative.   Psychiatric/Behavioral: Negative.     Height 5\' 4"  (1.626 m), weight 67.6 kg (149 lb). Physical Exam  Constitutional: She appears well-developed.  Neck: Normal range of motion.  Cardiovascular: Normal rate.   GI: Soft.  Musculoskeletal: Normal range of motion.  Neurological: She is alert.  Skin: Skin is warm.      Assessment/Plan Implant rupture for removal and replacement  Leisa Gault L, MD 05/27/2016, 7:20 AM

## 2016-05-27 NOTE — Transfer of Care (Signed)
Immediate Anesthesia Transfer of Care Note  Patient: Amanda Palmer  Procedure(s) Performed: Procedure(s): REMOVAL AND REPLACEMENT OF RIGHT BREAST IMPLANT AND BREAST CAPSULECTOMY (Right)  Patient Location: PACU  Anesthesia Type:General  Level of Consciousness: awake and patient cooperative  Airway & Oxygen Therapy: Patient Spontanous Breathing and Patient connected to face mask oxygen  Post-op Assessment: Report given to RN and Post -op Vital signs reviewed and stable  Post vital signs: Reviewed and stable  Last Vitals:  Vitals:   05/27/16 0933  BP: (!) 177/54  Pulse: 62  Resp: 18  Temp: 36.8 C    Last Pain:  Vitals:   05/27/16 0933  TempSrc: Oral         Complications: No apparent anesthesia complications

## 2016-05-27 NOTE — Discharge Instructions (Signed)
Post Anesthesia Home Care Instructions  Activity: Get plenty of rest for the remainder of the day. A responsible adult should stay with you for 24 hours following the procedure.  For the next 24 hours, DO NOT: -Drive a car -Paediatric nurse -Drink alcoholic beverages -Take any medication unless instructed by your physician -Make any legal decisions or sign important papers.  Meals: Start with liquid foods such as gelatin or soup. Progress to regular foods as tolerated. Avoid greasy, spicy, heavy foods. If nausea and/or vomiting occur, drink only clear liquids until the nausea and/or vomiting subsides. Call your physician if vomiting continues.  Special Instructions/Symptoms: Your throat may feel dry or sore from the anesthesia or the breathing tube placed in your throat during surgery. If this causes discomfort, gargle with warm salt water. The discomfort should disappear within 24 hours.  If you had a scopolamine patch placed behind your ear for the management of post- operative nausea and/or vomiting:  1. The medication in the patch is effective for 72 hours, after which it should be removed.  Wrap patch in a tissue and discard in the trash. Wash hands thoroughly with soap and water. 2. You may remove the patch earlier than 72 hours if you experience unpleasant side effects which may include dry mouth, dizziness or visual disturbances. 3. Avoid touching the patch. Wash your hands with soap and water after contact with the patch.   Activity (include date of return to work if known) As tolerated: NO showers NO driving No heavy activities  Diet:regular No restrictions:  Wound Care: Keep dressing clean & dry  Do not change dressings For Abdominoplasties wear abdominal binder Special Instructions: Do not raise arms up Continue to empty, recharge, & record drainage from J-P drains &/or Hemovacs 2-3 times a day, as needed. Call Doctor if any unusual problems occur such as pain,  excessive Bleeding, unrelieved Nausea/vomiting, Fever &/or chills When lying down, keep head elevated on 2-3 pillows or back-rest For Addominoplasties the Jack-knife position Follow-up appointment: Please call the office.  The patient received discharge instruction from:___________________________________________   Patient signature ________________________________________ / Date___________    Signature of individual providing instructions/ Date________________                  Post Anesthesia Home Care Instructions  Activity: Get plenty of rest for the remainder of the day. A responsible adult should stay with you for 24 hours following the procedure.  For the next 24 hours, DO NOT: -Drive a car -Paediatric nurse -Drink alcoholic beverages -Take any medication unless instructed by your physician -Make any legal decisions or sign important papers.  Meals: Start with liquid foods such as gelatin or soup. Progress to regular foods as tolerated. Avoid greasy, spicy, heavy foods. If nausea and/or vomiting occur, drink only clear liquids until the nausea and/or vomiting subsides. Call your physician if vomiting continues.  Special Instructions/Symptoms: Your throat may feel dry or sore from the anesthesia or the breathing tube placed in your throat during surgery. If this causes discomfort, gargle with warm salt water. The discomfort should disappear within 24 hours.  If you had a scopolamine patch placed behind your ear for the management of post- operative nausea and/or vomiting:  1. The medication in the patch is effective for 72 hours, after which it should be removed.  Wrap patch in a tissue and discard in the trash. Wash hands thoroughly with soap and water. 2. You may remove the patch earlier than 72 hours if  you experience unpleasant side effects which may include dry mouth, dizziness or visual disturbances. 3. Avoid touching the patch. Wash your hands with soap and water  after contact with the patch.

## 2016-05-27 NOTE — Anesthesia Postprocedure Evaluation (Addendum)
Anesthesia Post Note  Patient: Amanda Palmer  Procedure(s) Performed: Procedure(s) (LRB): REMOVAL AND REPLACEMENT OF RIGHT BREAST IMPLANT AND BREAST CAPSULECTOMY (Right)  Patient location during evaluation: PACU Anesthesia Type: General Level of consciousness: awake and alert Pain management: pain level controlled Vital Signs Assessment: post-procedure vital signs reviewed and stable Respiratory status: spontaneous breathing, nonlabored ventilation, respiratory function stable and patient connected to nasal cannula oxygen Cardiovascular status: blood pressure returned to baseline and stable Postop Assessment: no signs of nausea or vomiting Anesthetic complications: no       Last Vitals:  Vitals:   05/27/16 1215 05/27/16 1245  BP: (!) 161/65 (!) 159/68  Pulse: 77 73  Resp: 14 20  Temp:  36.4 C    Last Pain:  Vitals:   05/27/16 1245  TempSrc:   PainSc: 0-No pain                 Airabella Barley,JAMES TERRILL

## 2016-05-28 ENCOUNTER — Encounter (HOSPITAL_BASED_OUTPATIENT_CLINIC_OR_DEPARTMENT_OTHER): Payer: Self-pay | Admitting: Specialist

## 2016-06-13 DIAGNOSIS — I739 Peripheral vascular disease, unspecified: Secondary | ICD-10-CM | POA: Diagnosis not present

## 2016-06-26 ENCOUNTER — Other Ambulatory Visit (HOSPITAL_COMMUNITY): Payer: Self-pay | Admitting: Internal Medicine

## 2016-06-26 DIAGNOSIS — N6012 Diffuse cystic mastopathy of left breast: Secondary | ICD-10-CM

## 2016-07-16 ENCOUNTER — Ambulatory Visit (HOSPITAL_COMMUNITY)
Admission: RE | Admit: 2016-07-16 | Discharge: 2016-07-16 | Disposition: A | Discharge status: Home / Self Care | Payer: Medicare Other | Source: Ambulatory Visit | Attending: Internal Medicine | Admitting: Internal Medicine

## 2016-07-16 DIAGNOSIS — R922 Inconclusive mammogram: Secondary | ICD-10-CM | POA: Diagnosis not present

## 2016-07-16 DIAGNOSIS — N6012 Diffuse cystic mastopathy of left breast: Secondary | ICD-10-CM | POA: Insufficient documentation

## 2016-08-09 NOTE — Addendum Note (Signed)
Addendum  created 08/09/16 1233 by Rica Koyanagi, MD   Sign clinical note

## 2016-08-19 ENCOUNTER — Other Ambulatory Visit: Payer: Self-pay | Admitting: Cardiovascular Disease

## 2016-08-22 DIAGNOSIS — I739 Peripheral vascular disease, unspecified: Secondary | ICD-10-CM | POA: Diagnosis not present

## 2016-08-28 ENCOUNTER — Encounter: Payer: Self-pay | Admitting: Gastroenterology

## 2016-10-07 ENCOUNTER — Other Ambulatory Visit: Payer: Self-pay

## 2016-10-22 ENCOUNTER — Ambulatory Visit (INDEPENDENT_AMBULATORY_CARE_PROVIDER_SITE_OTHER): Payer: Medicare Other | Admitting: Gastroenterology

## 2016-10-22 ENCOUNTER — Encounter: Payer: Self-pay | Admitting: Gastroenterology

## 2016-10-22 DIAGNOSIS — D509 Iron deficiency anemia, unspecified: Secondary | ICD-10-CM | POA: Diagnosis not present

## 2016-10-22 DIAGNOSIS — Z86010 Personal history of colonic polyps: Secondary | ICD-10-CM

## 2016-10-22 DIAGNOSIS — K449 Diaphragmatic hernia without obstruction or gangrene: Secondary | ICD-10-CM | POA: Diagnosis not present

## 2016-10-22 DIAGNOSIS — I10 Essential (primary) hypertension: Secondary | ICD-10-CM | POA: Diagnosis not present

## 2016-10-22 DIAGNOSIS — K219 Gastro-esophageal reflux disease without esophagitis: Secondary | ICD-10-CM | POA: Diagnosis not present

## 2016-10-22 DIAGNOSIS — K222 Esophageal obstruction: Secondary | ICD-10-CM | POA: Insufficient documentation

## 2016-10-22 NOTE — Patient Instructions (Signed)
1. Continue Nexium 20 mg daily, 60 minutes before breakfast. 2. You may continue to use antacids over-the-counter as needed. 3. We will see back in one year or call sooner if needed.

## 2016-10-22 NOTE — Progress Notes (Signed)
cc'ed to pcp °

## 2016-10-22 NOTE — Progress Notes (Signed)
Primary Care Physician:  Celene Squibb, MD  Primary Gastroenterologist:  Garfield Cornea, MD   Chief Complaint  Patient presents with  . Hiatal Hernia    HPI:  Amanda Palmer is a 83 y.o. female here To establish care. Previously managed by Dr. Britta Mccreedy. Patient states she has a history of hiatal hernia. Recently was having issues, feeling like food souring. After a meal she will belch and have a sour taste in her mouth. Typical heartburn well-controlled over-the-counter Nexium daily. Initially with some epigastric discomfort. This is now resolved. Prior history of esophageal dilation in the past. Denies dysphagia. No nausea vomiting. No unintentional weight loss. Appetite is good. Bowel function is regular. Consumes prunes nightly.  She had EGD by Dr. Britta Mccreedy in 2015, hiatal hernia, Schatzki ring which was nonobstructive. No indication that this was dilated. Her last colonoscopy was June 2016, she had a tubular adenoma removed from the cecum.  Current Outpatient Prescriptions  Medication Sig Dispense Refill  . albuterol (PROVENTIL HFA;VENTOLIN HFA) 108 (90 BASE) MCG/ACT inhaler Inhale 1-2 puffs into the lungs every 6 (six) hours as needed for wheezing or shortness of breath.    Marland Kitchen aspirin 325 MG tablet Take 325 mg by mouth daily.    Marland Kitchen atorvastatin (LIPITOR) 20 MG tablet Take 1 tablet by mouth daily.    . Calcium Carbonate Antacid (ALKA-SELTZER ANTACID PO) Take by mouth as needed.    . Calcium-Vitamin D-Vitamin K 500-500-40 MG-UNT-MCG CHEW Chew 1 tablet by mouth daily.    . clonazePAM (KLONOPIN) 1 MG tablet Take 1 tablet (1 mg total) by mouth at bedtime. 30 tablet 0  . ELIQUIS 5 MG TABS tablet TAKE ONE TABLET BY MOUTH TWICE DAILY 180 tablet 1  . esomeprazole (NEXIUM) 20 MG capsule Take 20 mg by mouth daily at 12 noon.    . famotidine (PEPCID) 20 MG tablet One at bedtime (Patient taking differently: 20 mg at bedtime as needed. One at bedtime) 30 tablet 2  . ferrous sulfate 325 (65 FE) MG tablet  Take 325 mg by mouth daily.     Marland Kitchen gabapentin (NEURONTIN) 300 MG capsule Take 300 mg by mouth at bedtime.     Marland Kitchen HYDROcodone-acetaminophen (NORCO/VICODIN) 5-325 MG tablet Take 1 tablet by mouth 3 (three) times daily as needed.    Marland Kitchen levocetirizine (XYZAL) 5 MG tablet Take 5 mg by mouth every evening.    . metoprolol tartrate (LOPRESSOR) 25 MG tablet TAKE ONE TABLET BY MOUTH TWICE DAILY 60 tablet 6  . mometasone (ELOCON) 0.1 % cream Apply 1 application topically 2 (two) times daily.    . montelukast (SINGULAIR) 10 MG tablet Take 10 mg by mouth daily.    . Multiple Vitamin (MULTIVITAMIN) tablet Take 1 tablet by mouth daily.    . nitroGLYCERIN (NITROSTAT) 0.4 MG SL tablet Place 1 tablet (0.4 mg total) under the tongue every 5 (five) minutes as needed for chest pain. 25 tablet 3  . OXYGEN Inhale 2 L into the lungs at bedtime.    . traMADol (ULTRAM) 50 MG tablet Take 1 tablet by mouth as needed.    . triamcinolone cream (KENALOG) 0.1 % Apply 1 application topically as needed.    . venlafaxine (EFFEXOR) 75 MG tablet Take 75 mg by mouth daily. Takes 3 tablets by mouth daily    . vitamin B-12 (CYANOCOBALAMIN) 100 MCG tablet Take 100 mcg by mouth daily.     No current facility-administered medications for this visit.     Allergies as  of 10/22/2016 - Review Complete 10/22/2016  Allergen Reaction Noted  . Morphine and related Nausea And Vomiting 01/27/2014  . Sulfa antibiotics Nausea And Vomiting 01/27/2014  . Penicillins Rash 01/27/2014    Past Medical History:  Diagnosis Date  . A-fib ([place])   . Anxiety   . Cancer ([place])    breast  . COPD (chronic obstructive pulmonary disease) ([place])   . Depression   . Hypertension   . TIA (transient ischemic attack)    remote    Past Surgical History:  Procedure Laterality Date  . ABDOMINAL HYSTERECTOMY    . APPENDECTOMY    . BACK SURGERY     total of five surgeries  . BREAST CAPSULECTOMY WITH IMPLANT EXCHANGE Right 05/27/2016   Procedure: REMOVAL AND  REPLACEMENT OF RIGHT BREAST IMPLANT AND BREAST CAPSULECTOMY;  Surgeon: Cristine Polio, MD;  Location: [facility];  Service: Plastics;  Laterality: Right;  . CARDIAC CATHETERIZATION    . CHOLECYSTECTOMY    . COLONOSCOPY  08/2014   Dr. Britta Mccreedy: Small sessile polyp at the cecum, removed, tubular adenoma  . ESOPHAGOGASTRODUODENOSCOPY  06/2013   Dr. Britta Mccreedy: Hiatal hernia, Schatzki ring, nonobstructive.  Marland Kitchen MASTECTOMY     right   . ORIF ANKLE FRACTURE Left 06/01/2014   Procedure: OPEN REDUCTION INTERNAL FIXATION (ORIF) LEFT ANKLE FRACTURE;  Surgeon: Marybelle Killings, MD;  Location: [facility];  Service: Orthopedics;  Laterality: Left;  . ROTATOR CUFF REPAIR     left  . TOTAL HIP ARTHROPLASTY     right  . TOTAL KNEE ARTHROPLASTY     bilateral    Family History  Problem Relation Age of Onset  . Heart disease Brother   . Heart disease Sister   . Breast cancer Sister   . Leukemia Brother   . Breast cancer Sister   . Breast cancer Other        one deceased, one living  . Colon cancer Neg Hx     Social History   Social History  . Marital status: Widowed    Spouse name: N/A  . Number of children: N/A  . Years of education: N/A   Occupational History  . Not on file.   Social History Main Topics  . Smoking status: Never Smoker  . Smokeless tobacco: Never Used  . Alcohol use No  . Drug use: No  . Sexual activity: Not on file   Other Topics Concern  . Not on file   Social History Narrative  . No narrative on file      ROS:  General: Negative for anorexia, weight loss, fever, chills, fatigue, weakness. Eyes: Negative for vision changes.  ENT: Negative for hoarseness, difficulty swallowing , nasal congestion. CV: Negative for chest pain, angina, palpitations, dyspnea on exertion, peripheral edema.  Respiratory: Negative for dyspnea at rest, dyspnea on exertion, cough, sputum, wheezing.  GI: See history of present illness. GU:  Negative for dysuria, hematuria,  urinary incontinence, urinary frequency, nocturnal urination.  MS: Negative for joint pain, low back pain.  Derm: Negative for rash or itching.  Neuro: Negative for weakness, abnormal sensation, seizure, frequent headaches, memory loss, confusion.  Psych: Negative for anxiety, depression, suicidal ideation, hallucinations.  Endo: Negative for unusual weight change.  Heme: Negative for bruising or bleeding. Allergy: Negative for rash or hives.    Physical Examination:  BP (!) 150/67   Pulse 70   Temp 97.7 F (36.5 C) (Oral)   Ht 5\' 4"  (1.626 m)   Wt  149 lb (67.6 kg)   BMI 25.58 kg/m    General: Well-nourished, well-developed in no acute distress.  Head: Normocephalic, atraumatic.   Eyes: Conjunctiva pink, no icterus. Mouth: Oropharyngeal mucosa moist and pink , no lesions erythema or exudate. Neck: Supple without thyromegaly, masses, or lymphadenopathy.  Lungs: Clear to auscultation bilaterally.  Heart: Regular rate and rhythm, no murmurs rubs or gallops.  Abdomen: Bowel sounds are normal, nontender, nondistended, no hepatosplenomegaly or masses, no abdominal bruits or    hernia , no rebound or guarding.   Rectal: Not performed Extremities: No lower extremity edema. No clubbing or deformities.  Neuro: Alert and oriented x 4 , grossly normal neurologically.  Skin: Warm and dry, no rash or jaundice.   Psych: Alert and cooperative, normal mood and affect.

## 2016-10-22 NOTE — Assessment & Plan Note (Signed)
Last colonoscopy 2016, tubular adenoma. We discussed consideration of colonoscopy in 2021 given history of tubular adenoma and longevity in her genetics. Reassess closer today and determine appropriateness of surveillance colonoscopy at that time.

## 2016-10-22 NOTE — Assessment & Plan Note (Signed)
83 year old female with history of hiatal hernia, GERD presents to establish care. Previously followed by Dr. Britta Mccreedy he was left the area. Her last EGD was in 2015. She had a nonobstructive Schatzki ring, hiatal hernia, no indication that esophagus was dilated. She denies dysphagia. She takes over-the-counter Nexium 20 mg daily usually with good relief of her reflux. Recently had some epigastric discomfort and sour taste with belching. This is now resolved with use of over-the-counter antacids. No longer having to take any additional medications just her Nexium. Reinforced anti-reflex measures. Warning symptoms explained. She'll call with any problems. Otherwise see her back in one year.

## 2016-10-23 DIAGNOSIS — J383 Other diseases of vocal cords: Secondary | ICD-10-CM | POA: Diagnosis not present

## 2016-10-23 DIAGNOSIS — I10 Essential (primary) hypertension: Secondary | ICD-10-CM | POA: Diagnosis not present

## 2016-10-23 DIAGNOSIS — J9611 Chronic respiratory failure with hypoxia: Secondary | ICD-10-CM | POA: Diagnosis not present

## 2016-10-23 DIAGNOSIS — I482 Chronic atrial fibrillation: Secondary | ICD-10-CM | POA: Diagnosis not present

## 2016-10-24 DIAGNOSIS — F330 Major depressive disorder, recurrent, mild: Secondary | ICD-10-CM | POA: Diagnosis not present

## 2016-10-24 DIAGNOSIS — D509 Iron deficiency anemia, unspecified: Secondary | ICD-10-CM | POA: Diagnosis not present

## 2016-10-24 DIAGNOSIS — E782 Mixed hyperlipidemia: Secondary | ICD-10-CM | POA: Diagnosis not present

## 2016-10-24 DIAGNOSIS — I482 Chronic atrial fibrillation: Secondary | ICD-10-CM | POA: Diagnosis not present

## 2016-10-24 DIAGNOSIS — K219 Gastro-esophageal reflux disease without esophagitis: Secondary | ICD-10-CM | POA: Diagnosis not present

## 2016-10-24 DIAGNOSIS — I10 Essential (primary) hypertension: Secondary | ICD-10-CM | POA: Diagnosis not present

## 2016-10-24 DIAGNOSIS — Z6827 Body mass index (BMI) 27.0-27.9, adult: Secondary | ICD-10-CM | POA: Diagnosis not present

## 2016-10-24 DIAGNOSIS — G8929 Other chronic pain: Secondary | ICD-10-CM | POA: Diagnosis not present

## 2016-11-07 DIAGNOSIS — I739 Peripheral vascular disease, unspecified: Secondary | ICD-10-CM | POA: Diagnosis not present

## 2016-11-27 ENCOUNTER — Telehealth: Payer: Self-pay | Admitting: Gastroenterology

## 2016-11-27 MED ORDER — SUCRALFATE 1 GM/10ML PO SUSP: 1.0000 g | Freq: Three times a day (TID) | ORAL | 0 refills | Status: DC

## 2016-11-27 NOTE — Telephone Encounter (Signed)
She can increase Nexium to BID for two weeks.  Add carafate slurries four times daily for 10 days. rx sent. Call if any ongoing problems. Call if she notes any difficult with increasing throat pain, difficulty swallowing, difficulty breathing.

## 2016-11-27 NOTE — Addendum Note (Signed)
Addended by: Mahala Menghini on: 11/27/2016 03:26 PM   Modules accepted: Orders

## 2016-11-27 NOTE — Telephone Encounter (Signed)
Pt is aware.  

## 2016-11-27 NOTE — Telephone Encounter (Signed)
Pt called and said that her insurance will not cover the Carafate. Please advise

## 2016-11-27 NOTE — Telephone Encounter (Addendum)
I spoke to pt and she did remember seeing Anselmo Rod in August when I spoke with her. She said she had problems last night taking a pill ( antibiotic).  She is preparing to have a root canal and crown and was put on antibiotics. She said it first felt like it lodged right below her throat and it felt like a knot. Then she said that it dissolved. But she still has a feeling of a knot.  She wants to know what she should do.  I told her it is normal to have the sensation for awhile after an incident like that. She said this is the first time that it has happened. She does not have any problems with food or liquids. She takes Nexium once a day, and wants to know if she should increase to bid. Please advise!

## 2016-11-27 NOTE — Telephone Encounter (Signed)
Pt called to verify her OV with [place]. I told her she didn't have an OV with [place] because we had seen her on 10/22/2016 and she was on the recall list to follow up in one year. Patient does not remember coming to this appointment. I verified name, dob, phone number and address. Everything was correct. I told patient that she came to [place] for a hernia and wanted to establish GI care with [place] since Dr Britta Mccreedy in [place] had left. Patient still doesn't remember. I offered to make her an OV to come back or to have the nurse to call her to see what the provider would recommend. Pt agreed. Please call [phone]  (patient said that she has a lump in her chest and it was bothering her)

## 2016-11-28 MED ORDER — ALUM & MAG HYDROXIDE-SIMETH 400-400-40 MG/5ML PO SUSP: ORAL | 0 refills | Status: DC

## 2016-11-28 NOTE — Telephone Encounter (Signed)
Tried to call with no answer  

## 2016-11-28 NOTE — Telephone Encounter (Signed)
Increase the nexium bid for 2 weeks as discussed. She can buy OTC Mylanta and use 3-4 times daily if she is having sensation of throat pain or something still in her throat.

## 2016-11-28 NOTE — Addendum Note (Signed)
Addended by: Mahala Menghini on: 11/28/2016 03:28 PM   Modules accepted: Orders

## 2016-11-30 ENCOUNTER — Other Ambulatory Visit: Payer: Self-pay | Admitting: Cardiovascular Disease

## 2016-12-02 NOTE — Telephone Encounter (Signed)
Tried to call with no answer  

## 2016-12-04 ENCOUNTER — Other Ambulatory Visit: Payer: Self-pay

## 2016-12-05 ENCOUNTER — Telehealth: Payer: Self-pay

## 2016-12-05 NOTE — Telephone Encounter (Signed)
[facility] called ans said that Sucralfate tablets needs to bee sent in because she can not afford the liquid. Please advise

## 2016-12-05 NOTE — Telephone Encounter (Signed)
Letter mailed with recommendations.

## 2016-12-06 MED ORDER — SUCRALFATE 1 G PO TABS: 1.0000 g | ORAL_TABLET | Freq: Three times a day (TID) | ORAL | 0 refills | Status: DC

## 2016-12-06 NOTE — Addendum Note (Signed)
Addended by: Mahala Menghini on: 12/06/2016 08:20 AM   Modules accepted: Orders

## 2016-12-06 NOTE — Telephone Encounter (Signed)
Routing to refill box  

## 2016-12-24 ENCOUNTER — Other Ambulatory Visit: Payer: Self-pay | Admitting: Gastroenterology

## 2016-12-30 ENCOUNTER — Other Ambulatory Visit: Payer: Self-pay | Admitting: *Deleted

## 2016-12-31 MED ORDER — SUCRALFATE 1 G PO TABS: 1.0000 g | ORAL_TABLET | Freq: Three times a day (TID) | ORAL | 3 refills | Status: DC

## 2017-01-15 DIAGNOSIS — I739 Peripheral vascular disease, unspecified: Secondary | ICD-10-CM | POA: Diagnosis not present

## 2017-01-21 DIAGNOSIS — Z23 Encounter for immunization: Secondary | ICD-10-CM | POA: Diagnosis not present

## 2017-01-27 ENCOUNTER — Emergency Department (HOSPITAL_COMMUNITY)
Admission: EM | Admit: 2017-01-27 | Discharge: 2017-01-27 | Disposition: A | Discharge status: Home / Self Care | Payer: Medicare Other | Attending: Emergency Medicine | Admitting: Emergency Medicine

## 2017-01-27 ENCOUNTER — Encounter (HOSPITAL_COMMUNITY): Payer: Self-pay

## 2017-01-27 ENCOUNTER — Other Ambulatory Visit: Payer: Self-pay

## 2017-01-27 DIAGNOSIS — R9431 Abnormal electrocardiogram [ECG] [EKG]: Secondary | ICD-10-CM | POA: Diagnosis not present

## 2017-01-27 DIAGNOSIS — Z7982 Long term (current) use of aspirin: Secondary | ICD-10-CM | POA: Diagnosis not present

## 2017-01-27 DIAGNOSIS — M810 Age-related osteoporosis without current pathological fracture: Secondary | ICD-10-CM | POA: Diagnosis not present

## 2017-01-27 DIAGNOSIS — K219 Gastro-esophageal reflux disease without esophagitis: Secondary | ICD-10-CM | POA: Diagnosis not present

## 2017-01-27 DIAGNOSIS — R110 Nausea: Secondary | ICD-10-CM | POA: Insufficient documentation

## 2017-01-27 DIAGNOSIS — Z9981 Dependence on supplemental oxygen: Secondary | ICD-10-CM | POA: Diagnosis not present

## 2017-01-27 DIAGNOSIS — Z5321 Procedure and treatment not carried out due to patient leaving prior to being seen by health care provider: Secondary | ICD-10-CM | POA: Insufficient documentation

## 2017-01-27 DIAGNOSIS — E7800 Pure hypercholesterolemia, unspecified: Secondary | ICD-10-CM | POA: Diagnosis not present

## 2017-01-27 DIAGNOSIS — K529 Noninfective gastroenteritis and colitis, unspecified: Secondary | ICD-10-CM | POA: Diagnosis not present

## 2017-01-27 DIAGNOSIS — M1990 Unspecified osteoarthritis, unspecified site: Secondary | ICD-10-CM | POA: Diagnosis not present

## 2017-01-27 DIAGNOSIS — F329 Major depressive disorder, single episode, unspecified: Secondary | ICD-10-CM | POA: Diagnosis not present

## 2017-01-27 DIAGNOSIS — Z79899 Other long term (current) drug therapy: Secondary | ICD-10-CM | POA: Diagnosis not present

## 2017-01-27 DIAGNOSIS — K7689 Other specified diseases of liver: Secondary | ICD-10-CM | POA: Diagnosis not present

## 2017-01-27 DIAGNOSIS — F419 Anxiety disorder, unspecified: Secondary | ICD-10-CM | POA: Diagnosis not present

## 2017-01-27 DIAGNOSIS — Z853 Personal history of malignant neoplasm of breast: Secondary | ICD-10-CM | POA: Diagnosis not present

## 2017-01-27 DIAGNOSIS — I4891 Unspecified atrial fibrillation: Secondary | ICD-10-CM | POA: Diagnosis not present

## 2017-01-27 DIAGNOSIS — I10 Essential (primary) hypertension: Secondary | ICD-10-CM | POA: Diagnosis not present

## 2017-01-27 LAB — LIPASE, BLOOD: Lipase: 27 U/L (ref 11–51)

## 2017-01-27 LAB — CBC
HCT: 40.4 % (ref 36.0–46.0)
Hemoglobin: 12.5 g/dL (ref 12.0–15.0)
MCH: 30.3 pg (ref 26.0–34.0)
MCHC: 30.9 g/dL (ref 30.0–36.0)
MCV: 97.8 fL (ref 78.0–100.0)
PLATELETS: 226 10*3/uL (ref 150–400)
RBC: 4.13 MIL/uL (ref 3.87–5.11)
RDW: 13.5 % (ref 11.5–15.5)
WBC: 4.9 10*3/uL (ref 4.0–10.5)

## 2017-01-27 LAB — COMPREHENSIVE METABOLIC PANEL
ALK PHOS: 79 U/L (ref 38–126)
ALT: 21 U/L (ref 14–54)
ANION GAP: 6 (ref 5–15)
AST: 24 U/L (ref 15–41)
Albumin: 4 g/dL (ref 3.5–5.0)
BUN: 16 mg/dL (ref 6–20)
CALCIUM: 9.6 mg/dL (ref 8.9–10.3)
CO2: 28 mmol/L (ref 22–32)
Chloride: 103 mmol/L (ref 101–111)
Creatinine, Ser: 0.75 mg/dL (ref 0.44–1.00)
Glucose, Bld: 113 mg/dL — ABNORMAL HIGH (ref 65–99)
Potassium: 4 mmol/L (ref 3.5–5.1)
SODIUM: 137 mmol/L (ref 135–145)
TOTAL PROTEIN: 7.1 g/dL (ref 6.5–8.1)
Total Bilirubin: 0.5 mg/dL (ref 0.3–1.2)

## 2017-01-27 NOTE — ED Notes (Signed)
Patient left ED per registration. 

## 2017-01-27 NOTE — ED Triage Notes (Signed)
Patient reports of being out of Klonopin since October 21st due to losing medication. States she has been nauseated. Not able to get them filled until 01/29/17.

## 2017-02-06 DIAGNOSIS — K219 Gastro-esophageal reflux disease without esophagitis: Secondary | ICD-10-CM | POA: Diagnosis not present

## 2017-02-06 DIAGNOSIS — Z6827 Body mass index (BMI) 27.0-27.9, adult: Secondary | ICD-10-CM | POA: Diagnosis not present

## 2017-02-06 DIAGNOSIS — J069 Acute upper respiratory infection, unspecified: Secondary | ICD-10-CM | POA: Diagnosis not present

## 2017-03-03 ENCOUNTER — Other Ambulatory Visit: Payer: Self-pay | Admitting: Cardiovascular Disease

## 2017-03-18 ENCOUNTER — Encounter: Payer: Self-pay | Admitting: Cardiovascular Disease

## 2017-03-18 ENCOUNTER — Ambulatory Visit (INDEPENDENT_AMBULATORY_CARE_PROVIDER_SITE_OTHER): Payer: Medicare Other | Admitting: Cardiovascular Disease

## 2017-03-18 ENCOUNTER — Other Ambulatory Visit: Payer: Self-pay

## 2017-03-18 VITALS — BP 154/66 | HR 71 | Ht 64.0 in | Wt 147.2 lb

## 2017-03-18 DIAGNOSIS — R29898 Other symptoms and signs involving the musculoskeletal system: Secondary | ICD-10-CM | POA: Diagnosis not present

## 2017-03-18 DIAGNOSIS — I481 Persistent atrial fibrillation: Secondary | ICD-10-CM

## 2017-03-18 DIAGNOSIS — E782 Mixed hyperlipidemia: Secondary | ICD-10-CM | POA: Diagnosis not present

## 2017-03-18 DIAGNOSIS — I10 Essential (primary) hypertension: Secondary | ICD-10-CM | POA: Diagnosis not present

## 2017-03-18 DIAGNOSIS — I4819 Other persistent atrial fibrillation: Secondary | ICD-10-CM

## 2017-03-18 MED ORDER — METOPROLOL TARTRATE 25 MG PO TABS: 12.5000 mg | ORAL_TABLET | Freq: Two times a day (BID) | ORAL | 1 refills | Status: DC

## 2017-03-18 MED ORDER — LISINOPRIL 5 MG PO TABS: 5.0000 mg | ORAL_TABLET | Freq: Every day | ORAL | 1 refills | Status: DC

## 2017-03-18 NOTE — Patient Instructions (Signed)
Your physician wants you to follow-up in: [place] will receive a reminder letter in the mail two months in advance. If you don't receive a letter, please call our office to schedule the follow-up appointment.  Your physician has recommended you make the following change in your medication:   DECREASE METOPROLOL 12.5 MG TWICE DAILY  START LISINOPRIL 5 MG DAILY   YOUR PHYSICIAN RECOMMENDS YOU HAVE AN MRI OF YOUR BACK.    Thank you for choosing [facility]!!

## 2017-03-18 NOTE — Progress Notes (Signed)
SUBJECTIVE: The patient returns for follow-up of persistent atrial fibrillation.   Echocardiogram on 06/01/14 demonstrated normal left ventricular systolic function, EF 69-67%, normal regional wall motion, grade 1 diastolic dysfunction, and mild left atrial dilatation.  She is doing well from a cardiac standpoint denies chest pain and palpitations.  She has occasional exertional dyspnea.  Her primary complaint relates to bilateral leg weakness.  She denies claudication pain.  She feels better after lying down for some time but leg weakness recurs after being up on her feet for some time.  She also has a history of lower back pain and mentions protruding disks.  She says she has not had any spinal imaging in some time.  Upon further discussion, it appears she feels slightly better when leaning forward while pushing a shopping cart.  ECG performed in the office today which I ordered and personally interpreted demonstrates normal sinus rhythm with nonspecific ST segment abnormalities.    Review of Systems: As per "subjective", otherwise negative.  Allergies  Allergen Reactions  . Morphine And Related Nausea And Vomiting  . Sulfa Antibiotics Nausea And Vomiting  . Penicillins Rash    Current Outpatient Medications  Medication Sig Dispense Refill  . albuterol (PROVENTIL HFA;VENTOLIN HFA) 108 (90 BASE) MCG/ACT inhaler Inhale 1-2 puffs into the lungs every 6 (six) hours as needed for wheezing or shortness of breath.    Marland Kitchen alum & mag hydroxide-simeth (MYLANTA DOUBLE-STRENGTH) 400-400-40 MG/5ML suspension As per bottle instructions.  0  . aspirin 325 MG tablet Take 325 mg by mouth daily.    Marland Kitchen atorvastatin (LIPITOR) 20 MG tablet Take 1 tablet by mouth daily.    . Calcium Carbonate Antacid (ALKA-SELTZER ANTACID PO) Take by mouth as needed.    . Calcium-Vitamin D-Vitamin K 500-500-40 MG-UNT-MCG CHEW Chew 1 tablet by mouth daily.    . clonazePAM (KLONOPIN) 1 MG tablet Take 1 tablet (1 mg  total) by mouth at bedtime. 30 tablet 0  . ELIQUIS 5 MG TABS tablet TAKE 1 TABLET BY MOUTH TWICE DAILY 180 tablet 0  . esomeprazole (NEXIUM) 20 MG capsule Take 20 mg by mouth daily at 12 noon.    . famotidine (PEPCID) 20 MG tablet One at bedtime (Patient taking differently: 20 mg at bedtime as needed. One at bedtime) 30 tablet 2  . ferrous sulfate 325 (65 FE) MG tablet Take 325 mg by mouth daily.     Marland Kitchen gabapentin (NEURONTIN) 300 MG capsule Take 300 mg by mouth at bedtime.     Marland Kitchen HYDROcodone-acetaminophen (NORCO/VICODIN) 5-325 MG tablet Take 1 tablet by mouth 3 (three) times daily as needed.    Marland Kitchen levocetirizine (XYZAL) 5 MG tablet Take 5 mg by mouth every evening.    . metoprolol tartrate (LOPRESSOR) 25 MG tablet TAKE 1 TABLET BY MOUTH TWICE DAILY 180 tablet 1  . mometasone (ELOCON) 0.1 % cream Apply 1 application topically 2 (two) times daily.    . montelukast (SINGULAIR) 10 MG tablet Take 10 mg by mouth daily.    . Multiple Vitamin (MULTIVITAMIN) tablet Take 1 tablet by mouth daily.    . nitroGLYCERIN (NITROSTAT) 0.4 MG SL tablet Place 1 tablet (0.4 mg total) under the tongue every 5 (five) minutes as needed for chest pain. 25 tablet 3  . OXYGEN Inhale 2 L into the lungs at bedtime.    . sucralfate (CARAFATE) 1 g tablet Take 1 tablet (1 g total) by mouth 4 (four) times daily -  with meals and  at bedtime. 120 tablet 3  . sucralfate (CARAFATE) 1 GM/10ML suspension Take 10 mLs (1 g total) by mouth 4 (four) times daily -  with meals and at bedtime. 420 mL 0  . traMADol (ULTRAM) 50 MG tablet Take 1 tablet by mouth as needed.    . triamcinolone cream (KENALOG) 0.1 % Apply 1 application topically as needed.    . venlafaxine (EFFEXOR) 75 MG tablet Take 75 mg by mouth daily. Takes 3 tablets by mouth daily    . vitamin B-12 (CYANOCOBALAMIN) 100 MCG tablet Take 100 mcg by mouth daily.     No current facility-administered medications for this visit.     Past Medical History:  Diagnosis Date  . A-fib  ([place])   . Anxiety   . Cancer ([employer])    breast  . COPD (chronic obstructive pulmonary disease) ([place])   . Depression   . Hypertension   . TIA (transient ischemic attack)    remote    Past Surgical History:  Procedure Laterality Date  . ABDOMINAL HYSTERECTOMY    . APPENDECTOMY    . BACK SURGERY     total of five surgeries  . BREAST CAPSULECTOMY WITH IMPLANT EXCHANGE Right 05/27/2016   Procedure: REMOVAL AND REPLACEMENT OF RIGHT BREAST IMPLANT AND BREAST CAPSULECTOMY;  Surgeon: Cristine Polio, MD;  Location: [facility];  Service: Plastics;  Laterality: Right;  . CARDIAC CATHETERIZATION    . CHOLECYSTECTOMY    . COLONOSCOPY  08/2014   Dr. Britta Mccreedy: Small sessile polyp at the cecum, removed, tubular adenoma  . ESOPHAGOGASTRODUODENOSCOPY  06/2013   Dr. Britta Mccreedy: Hiatal hernia, Schatzki ring, nonobstructive.  Marland Kitchen MASTECTOMY     right   . ORIF ANKLE FRACTURE Left 06/01/2014   Procedure: OPEN REDUCTION INTERNAL FIXATION (ORIF) LEFT ANKLE FRACTURE;  Surgeon: Marybelle Killings, MD;  Location: [facility];  Service: Orthopedics;  Laterality: Left;  . ROTATOR CUFF REPAIR     left  . TOTAL HIP ARTHROPLASTY     right  . TOTAL KNEE ARTHROPLASTY     bilateral    Social History   Socioeconomic History  . Marital status: Widowed    Spouse name: Not on file  . Number of children: Not on file  . Years of education: Not on file  . Highest education level: Not on file  Social Needs  . Financial resource strain: Not on file  . Food insecurity - worry: Not on file  . Food insecurity - inability: Not on file  . Transportation needs - medical: Not on file  . Transportation needs - non-medical: Not on file  Occupational History  . Not on file  Tobacco Use  . Smoking status: Never Smoker  . Smokeless tobacco: Never Used  Substance and Sexual Activity  . Alcohol use: No    Alcohol/week: 0.0 oz  . Drug use: No  . Sexual activity: Not on file  Other Topics Concern  . Not on file  Social  History Narrative  . Not on file     Vitals:   03/18/17 1318  BP: (!) 154/66  Pulse: 71  SpO2: 99%  Weight: 147 lb 3.2 oz (66.8 kg)  Height: 5\' 4"  (1.626 m)    Wt Readings from Last 3 Encounters:  03/18/17 147 lb 3.2 oz (66.8 kg)  10/22/16 149 lb (67.6 kg)  05/27/16 149 lb (67.6 kg)     PHYSICAL EXAM General: NAD HEENT: Normal. Neck: No JVD, no thyromegaly. Lungs: Clear to auscultation bilaterally with  normal respiratory effort. CV: Regular rate and rhythm, normal S1/S2, no S3/S4, soft 1/6 holosystolic murmur along the left lower sternal border. No pretibial or periankle edema.  No carotid bruit.   Abdomen: Soft, nontender, no distention.  Neurologic: Alert and oriented.  Psych: Normal affect. Skin: Normal. Musculoskeletal: No gross deformities.    ECG: Most recent ECG reviewed.   Labs: Lab Results  Component Value Date/Time   K 4.0 01/27/2017 02:05 PM   BUN 16 01/27/2017 02:05 PM   CREATININE 0.75 01/27/2017 02:05 PM   ALT 21 01/27/2017 02:05 PM   TSH 2.54 02/23/2015 10:58 AM   HGB 12.5 01/27/2017 02:05 PM     Lipids: No results found for: LDLCALC, LDLDIRECT, CHOL, TRIG, HDL     ASSESSMENT AND PLAN:  1. Persistent atrial fibrillation: Symptomatically stable on metoprolol 25 mg twice daily. CHADSVASC 6 thus high thromboembolic risk. Continue apixaban 5 mg bid.  As I am starting lisinopril for blood pressure control, I will reduce metoprolol to 12.5 mg twice daily.  2. Essential HTN:  Remains elevated.  I will start lisinopril 5 mg daily.  3. Hyperlipidemia: Continue Lipitor 20 mg.  4.  Bilateral leg weakness: She has normal lower extremity strength and physical exam.  Symptoms are alleviated with leaning forward, I suspect she may have some degree of lumbar spinal canal stenosis.  I will obtain an MRI of the lumbar spine.    Disposition: Follow up 1 yr   Kate Sable, M.D., F.A.C.C.

## 2017-03-19 ENCOUNTER — Other Ambulatory Visit: Payer: Self-pay | Admitting: *Deleted

## 2017-03-19 ENCOUNTER — Other Ambulatory Visit: Payer: Self-pay | Admitting: Cardiovascular Disease

## 2017-03-19 ENCOUNTER — Telehealth: Payer: Self-pay | Admitting: Cardiovascular Disease

## 2017-03-19 NOTE — Telephone Encounter (Signed)
Pre-cert Verification for the following procedure   MRI scheduled for 03/27/17 at [facility].

## 2017-03-19 NOTE — Addendum Note (Signed)
Addended by: Julian Hy T on: 03/19/2017 02:02 PM   Modules accepted: Orders

## 2017-03-27 ENCOUNTER — Ambulatory Visit (HOSPITAL_COMMUNITY): Payer: Medicare Other | Attending: Cardiovascular Disease

## 2017-04-04 ENCOUNTER — Ambulatory Visit (HOSPITAL_COMMUNITY)
Admission: RE | Admit: 2017-04-04 | Discharge: 2017-04-04 | Disposition: A | Discharge status: Home / Self Care | Payer: Medicare Other | Source: Ambulatory Visit | Attending: Cardiovascular Disease | Admitting: Cardiovascular Disease

## 2017-04-04 DIAGNOSIS — M4186 Other forms of scoliosis, lumbar region: Secondary | ICD-10-CM | POA: Diagnosis not present

## 2017-04-04 DIAGNOSIS — M48061 Spinal stenosis, lumbar region without neurogenic claudication: Secondary | ICD-10-CM | POA: Insufficient documentation

## 2017-04-04 DIAGNOSIS — R29898 Other symptoms and signs involving the musculoskeletal system: Secondary | ICD-10-CM | POA: Diagnosis not present

## 2017-04-04 DIAGNOSIS — M47816 Spondylosis without myelopathy or radiculopathy, lumbar region: Secondary | ICD-10-CM | POA: Insufficient documentation

## 2017-04-08 DIAGNOSIS — M4326 Fusion of spine, lumbar region: Secondary | ICD-10-CM | POA: Diagnosis not present

## 2017-04-08 DIAGNOSIS — M47816 Spondylosis without myelopathy or radiculopathy, lumbar region: Secondary | ICD-10-CM | POA: Diagnosis not present

## 2017-04-11 ENCOUNTER — Telehealth: Payer: Self-pay | Admitting: *Deleted

## 2017-04-11 NOTE — Telephone Encounter (Signed)
Notes recorded by Laurine Blazer, LPN on 10/10/1570 at 6:20 PM EST Patient notified and verbalized understanding. Copy to pmd. ------  Notes recorded by Laurine Blazer, LPN on 3/55/9741 at 6:38 AM EST Left message to return call.  ------  Notes recorded by Herminio Commons, MD on 04/04/2017 at 12:07 PM EST As suspected at office visit, she has mild spinal stenosis. Please send copy to PCP.

## 2017-04-16 DIAGNOSIS — I739 Peripheral vascular disease, unspecified: Secondary | ICD-10-CM | POA: Diagnosis not present

## 2017-04-29 DIAGNOSIS — D509 Iron deficiency anemia, unspecified: Secondary | ICD-10-CM | POA: Diagnosis not present

## 2017-04-29 DIAGNOSIS — I10 Essential (primary) hypertension: Secondary | ICD-10-CM | POA: Diagnosis not present

## 2017-04-29 DIAGNOSIS — D519 Vitamin B12 deficiency anemia, unspecified: Secondary | ICD-10-CM | POA: Diagnosis not present

## 2017-04-29 DIAGNOSIS — J069 Acute upper respiratory infection, unspecified: Secondary | ICD-10-CM | POA: Diagnosis not present

## 2017-04-29 DIAGNOSIS — Z6827 Body mass index (BMI) 27.0-27.9, adult: Secondary | ICD-10-CM | POA: Diagnosis not present

## 2017-04-29 DIAGNOSIS — K219 Gastro-esophageal reflux disease without esophagitis: Secondary | ICD-10-CM | POA: Diagnosis not present

## 2017-04-29 DIAGNOSIS — E782 Mixed hyperlipidemia: Secondary | ICD-10-CM | POA: Diagnosis not present

## 2017-05-01 DIAGNOSIS — K219 Gastro-esophageal reflux disease without esophagitis: Secondary | ICD-10-CM | POA: Diagnosis not present

## 2017-05-01 DIAGNOSIS — J069 Acute upper respiratory infection, unspecified: Secondary | ICD-10-CM | POA: Diagnosis not present

## 2017-05-01 DIAGNOSIS — Z6827 Body mass index (BMI) 27.0-27.9, adult: Secondary | ICD-10-CM | POA: Diagnosis not present

## 2017-06-02 ENCOUNTER — Other Ambulatory Visit: Payer: Self-pay | Admitting: Cardiovascular Disease

## 2017-06-02 DIAGNOSIS — E785 Hyperlipidemia, unspecified: Secondary | ICD-10-CM | POA: Diagnosis not present

## 2017-06-02 DIAGNOSIS — K219 Gastro-esophageal reflux disease without esophagitis: Secondary | ICD-10-CM | POA: Diagnosis not present

## 2017-06-02 DIAGNOSIS — J060 Acute laryngopharyngitis: Secondary | ICD-10-CM | POA: Diagnosis not present

## 2017-06-02 DIAGNOSIS — J069 Acute upper respiratory infection, unspecified: Secondary | ICD-10-CM | POA: Diagnosis not present

## 2017-06-02 DIAGNOSIS — M4800 Spinal stenosis, site unspecified: Secondary | ICD-10-CM | POA: Diagnosis not present

## 2017-06-02 DIAGNOSIS — I10 Essential (primary) hypertension: Secondary | ICD-10-CM | POA: Diagnosis not present

## 2017-06-02 DIAGNOSIS — Z6826 Body mass index (BMI) 26.0-26.9, adult: Secondary | ICD-10-CM | POA: Diagnosis not present

## 2017-06-02 DIAGNOSIS — D509 Iron deficiency anemia, unspecified: Secondary | ICD-10-CM | POA: Diagnosis not present

## 2017-06-02 DIAGNOSIS — M545 Low back pain: Secondary | ICD-10-CM | POA: Diagnosis not present

## 2017-06-02 DIAGNOSIS — R062 Wheezing: Secondary | ICD-10-CM | POA: Diagnosis not present

## 2017-06-02 DIAGNOSIS — I4891 Unspecified atrial fibrillation: Secondary | ICD-10-CM | POA: Diagnosis not present

## 2017-06-02 DIAGNOSIS — F339 Major depressive disorder, recurrent, unspecified: Secondary | ICD-10-CM | POA: Diagnosis not present

## 2017-06-09 DIAGNOSIS — M545 Low back pain: Secondary | ICD-10-CM | POA: Diagnosis not present

## 2017-06-09 DIAGNOSIS — Z6827 Body mass index (BMI) 27.0-27.9, adult: Secondary | ICD-10-CM | POA: Diagnosis not present

## 2017-06-09 DIAGNOSIS — D509 Iron deficiency anemia, unspecified: Secondary | ICD-10-CM | POA: Diagnosis not present

## 2017-06-09 DIAGNOSIS — Z6826 Body mass index (BMI) 26.0-26.9, adult: Secondary | ICD-10-CM | POA: Diagnosis not present

## 2017-06-09 DIAGNOSIS — K219 Gastro-esophageal reflux disease without esophagitis: Secondary | ICD-10-CM | POA: Diagnosis not present

## 2017-06-09 DIAGNOSIS — M4800 Spinal stenosis, site unspecified: Secondary | ICD-10-CM | POA: Diagnosis not present

## 2017-06-09 DIAGNOSIS — E785 Hyperlipidemia, unspecified: Secondary | ICD-10-CM | POA: Diagnosis not present

## 2017-06-09 DIAGNOSIS — J060 Acute laryngopharyngitis: Secondary | ICD-10-CM | POA: Diagnosis not present

## 2017-06-09 DIAGNOSIS — I4891 Unspecified atrial fibrillation: Secondary | ICD-10-CM | POA: Diagnosis not present

## 2017-06-09 DIAGNOSIS — I10 Essential (primary) hypertension: Secondary | ICD-10-CM | POA: Diagnosis not present

## 2017-06-09 DIAGNOSIS — J069 Acute upper respiratory infection, unspecified: Secondary | ICD-10-CM | POA: Diagnosis not present

## 2017-06-09 DIAGNOSIS — F339 Major depressive disorder, recurrent, unspecified: Secondary | ICD-10-CM | POA: Diagnosis not present

## 2017-06-27 ENCOUNTER — Other Ambulatory Visit (HOSPITAL_COMMUNITY): Payer: Self-pay | Admitting: Internal Medicine

## 2017-06-27 DIAGNOSIS — Z1231 Encounter for screening mammogram for malignant neoplasm of breast: Secondary | ICD-10-CM

## 2017-07-09 DIAGNOSIS — I739 Peripheral vascular disease, unspecified: Secondary | ICD-10-CM | POA: Diagnosis not present

## 2017-07-17 ENCOUNTER — Encounter (HOSPITAL_COMMUNITY): Payer: Self-pay

## 2017-07-17 ENCOUNTER — Other Ambulatory Visit (HOSPITAL_COMMUNITY): Payer: Self-pay | Admitting: Internal Medicine

## 2017-07-17 ENCOUNTER — Ambulatory Visit (HOSPITAL_COMMUNITY)
Admission: RE | Admit: 2017-07-17 | Discharge: 2017-07-17 | Disposition: A | Discharge status: Home / Self Care | Payer: Medicare Other | Source: Ambulatory Visit | Attending: Internal Medicine | Admitting: Internal Medicine

## 2017-07-17 DIAGNOSIS — Z1231 Encounter for screening mammogram for malignant neoplasm of breast: Secondary | ICD-10-CM | POA: Diagnosis not present

## 2017-07-17 DIAGNOSIS — Z9011 Acquired absence of right breast and nipple: Secondary | ICD-10-CM | POA: Insufficient documentation

## 2017-07-26 ENCOUNTER — Other Ambulatory Visit: Payer: Self-pay | Admitting: Cardiovascular Disease

## 2017-09-08 ENCOUNTER — Other Ambulatory Visit: Payer: Self-pay | Admitting: Cardiovascular Disease

## 2017-09-10 ENCOUNTER — Encounter: Payer: Self-pay | Admitting: Gastroenterology

## 2017-09-24 DIAGNOSIS — I739 Peripheral vascular disease, unspecified: Secondary | ICD-10-CM | POA: Diagnosis not present

## 2017-10-06 DIAGNOSIS — Z9882 Breast implant status: Secondary | ICD-10-CM | POA: Diagnosis not present

## 2017-10-06 DIAGNOSIS — Z6825 Body mass index (BMI) 25.0-25.9, adult: Secondary | ICD-10-CM | POA: Diagnosis not present

## 2017-10-06 DIAGNOSIS — Z9181 History of falling: Secondary | ICD-10-CM | POA: Diagnosis not present

## 2017-10-06 DIAGNOSIS — N6459 Other signs and symptoms in breast: Secondary | ICD-10-CM | POA: Diagnosis not present

## 2017-10-07 ENCOUNTER — Other Ambulatory Visit: Payer: Self-pay | Admitting: Internal Medicine

## 2017-10-07 DIAGNOSIS — Z9882 Breast implant status: Secondary | ICD-10-CM

## 2017-10-14 ENCOUNTER — Ambulatory Visit (HOSPITAL_COMMUNITY)
Admission: RE | Admit: 2017-10-14 | Discharge: 2017-10-14 | Disposition: A | Discharge status: Home / Self Care | Payer: Medicare Other | Source: Ambulatory Visit | Attending: Internal Medicine | Admitting: Internal Medicine

## 2017-10-14 DIAGNOSIS — N6489 Other specified disorders of breast: Secondary | ICD-10-CM | POA: Diagnosis not present

## 2017-10-14 DIAGNOSIS — Z9882 Breast implant status: Secondary | ICD-10-CM | POA: Diagnosis not present

## 2017-10-27 DIAGNOSIS — D509 Iron deficiency anemia, unspecified: Secondary | ICD-10-CM | POA: Diagnosis not present

## 2017-10-27 DIAGNOSIS — I10 Essential (primary) hypertension: Secondary | ICD-10-CM | POA: Diagnosis not present

## 2017-10-27 DIAGNOSIS — E785 Hyperlipidemia, unspecified: Secondary | ICD-10-CM | POA: Diagnosis not present

## 2017-10-28 DIAGNOSIS — F39 Unspecified mood [affective] disorder: Secondary | ICD-10-CM | POA: Diagnosis not present

## 2017-10-28 DIAGNOSIS — K449 Diaphragmatic hernia without obstruction or gangrene: Secondary | ICD-10-CM | POA: Diagnosis not present

## 2017-10-28 DIAGNOSIS — M4800 Spinal stenosis, site unspecified: Secondary | ICD-10-CM | POA: Diagnosis not present

## 2017-10-28 DIAGNOSIS — I4891 Unspecified atrial fibrillation: Secondary | ICD-10-CM | POA: Diagnosis not present

## 2017-10-28 DIAGNOSIS — G8929 Other chronic pain: Secondary | ICD-10-CM | POA: Diagnosis not present

## 2017-10-28 DIAGNOSIS — Z6825 Body mass index (BMI) 25.0-25.9, adult: Secondary | ICD-10-CM | POA: Diagnosis not present

## 2017-10-28 DIAGNOSIS — I10 Essential (primary) hypertension: Secondary | ICD-10-CM | POA: Diagnosis not present

## 2017-10-28 DIAGNOSIS — D649 Anemia, unspecified: Secondary | ICD-10-CM | POA: Diagnosis not present

## 2017-10-28 DIAGNOSIS — M545 Low back pain: Secondary | ICD-10-CM | POA: Diagnosis not present

## 2017-10-28 DIAGNOSIS — K219 Gastro-esophageal reflux disease without esophagitis: Secondary | ICD-10-CM | POA: Diagnosis not present

## 2017-10-28 DIAGNOSIS — E782 Mixed hyperlipidemia: Secondary | ICD-10-CM | POA: Diagnosis not present

## 2017-12-02 ENCOUNTER — Ambulatory Visit: Payer: Medicare Other | Admitting: Gastroenterology

## 2017-12-09 DIAGNOSIS — R140 Abdominal distension (gaseous): Secondary | ICD-10-CM | POA: Diagnosis not present

## 2017-12-09 DIAGNOSIS — R141 Gas pain: Secondary | ICD-10-CM | POA: Diagnosis not present

## 2017-12-09 DIAGNOSIS — Z23 Encounter for immunization: Secondary | ICD-10-CM | POA: Diagnosis not present

## 2017-12-09 DIAGNOSIS — Z6825 Body mass index (BMI) 25.0-25.9, adult: Secondary | ICD-10-CM | POA: Diagnosis not present

## 2017-12-09 DIAGNOSIS — K219 Gastro-esophageal reflux disease without esophagitis: Secondary | ICD-10-CM | POA: Diagnosis not present

## 2017-12-09 DIAGNOSIS — K449 Diaphragmatic hernia without obstruction or gangrene: Secondary | ICD-10-CM | POA: Diagnosis not present

## 2017-12-15 DIAGNOSIS — S61412A Laceration without foreign body of left hand, initial encounter: Secondary | ICD-10-CM | POA: Diagnosis not present

## 2017-12-15 DIAGNOSIS — I10 Essential (primary) hypertension: Secondary | ICD-10-CM | POA: Diagnosis not present

## 2017-12-15 DIAGNOSIS — F329 Major depressive disorder, single episode, unspecified: Secondary | ICD-10-CM | POA: Diagnosis not present

## 2017-12-15 DIAGNOSIS — Z7982 Long term (current) use of aspirin: Secondary | ICD-10-CM | POA: Diagnosis not present

## 2017-12-15 DIAGNOSIS — E7800 Pure hypercholesterolemia, unspecified: Secondary | ICD-10-CM | POA: Diagnosis not present

## 2017-12-15 DIAGNOSIS — W5503XA Scratched by cat, initial encounter: Secondary | ICD-10-CM | POA: Diagnosis not present

## 2017-12-15 DIAGNOSIS — M79642 Pain in left hand: Secondary | ICD-10-CM | POA: Diagnosis not present

## 2017-12-16 DIAGNOSIS — S61412A Laceration without foreign body of left hand, initial encounter: Secondary | ICD-10-CM | POA: Diagnosis not present

## 2017-12-17 DIAGNOSIS — S61412D Laceration without foreign body of left hand, subsequent encounter: Secondary | ICD-10-CM | POA: Diagnosis not present

## 2017-12-17 DIAGNOSIS — I739 Peripheral vascular disease, unspecified: Secondary | ICD-10-CM | POA: Diagnosis not present

## 2017-12-23 DIAGNOSIS — S61402D Unspecified open wound of left hand, subsequent encounter: Secondary | ICD-10-CM | POA: Diagnosis not present

## 2017-12-23 DIAGNOSIS — Z6825 Body mass index (BMI) 25.0-25.9, adult: Secondary | ICD-10-CM | POA: Diagnosis not present

## 2017-12-23 DIAGNOSIS — S61412A Laceration without foreign body of left hand, initial encounter: Secondary | ICD-10-CM | POA: Diagnosis not present

## 2017-12-23 DIAGNOSIS — Z0000 Encounter for general adult medical examination without abnormal findings: Secondary | ICD-10-CM | POA: Diagnosis not present

## 2018-01-17 DIAGNOSIS — Z7982 Long term (current) use of aspirin: Secondary | ICD-10-CM | POA: Diagnosis not present

## 2018-01-17 DIAGNOSIS — Z96641 Presence of right artificial hip joint: Secondary | ICD-10-CM | POA: Diagnosis not present

## 2018-01-17 DIAGNOSIS — Z96653 Presence of artificial knee joint, bilateral: Secondary | ICD-10-CM | POA: Diagnosis not present

## 2018-01-17 DIAGNOSIS — F329 Major depressive disorder, single episode, unspecified: Secondary | ICD-10-CM | POA: Diagnosis not present

## 2018-01-17 DIAGNOSIS — S0501XA Injury of conjunctiva and corneal abrasion without foreign body, right eye, initial encounter: Secondary | ICD-10-CM | POA: Diagnosis not present

## 2018-01-17 DIAGNOSIS — I10 Essential (primary) hypertension: Secondary | ICD-10-CM | POA: Diagnosis not present

## 2018-01-17 DIAGNOSIS — E7800 Pure hypercholesterolemia, unspecified: Secondary | ICD-10-CM | POA: Diagnosis not present

## 2018-01-17 DIAGNOSIS — K219 Gastro-esophageal reflux disease without esophagitis: Secondary | ICD-10-CM | POA: Diagnosis not present

## 2018-01-17 DIAGNOSIS — M1990 Unspecified osteoarthritis, unspecified site: Secondary | ICD-10-CM | POA: Diagnosis not present

## 2018-01-17 DIAGNOSIS — Z9011 Acquired absence of right breast and nipple: Secondary | ICD-10-CM | POA: Diagnosis not present

## 2018-01-17 DIAGNOSIS — W5503XA Scratched by cat, initial encounter: Secondary | ICD-10-CM | POA: Diagnosis not present

## 2018-01-17 DIAGNOSIS — Z853 Personal history of malignant neoplasm of breast: Secondary | ICD-10-CM | POA: Diagnosis not present

## 2018-01-17 DIAGNOSIS — Z79899 Other long term (current) drug therapy: Secondary | ICD-10-CM | POA: Diagnosis not present

## 2018-01-19 DIAGNOSIS — S0500XA Injury of conjunctiva and corneal abrasion without foreign body, unspecified eye, initial encounter: Secondary | ICD-10-CM | POA: Diagnosis not present

## 2018-01-19 DIAGNOSIS — H20041 Secondary noninfectious iridocyclitis, right eye: Secondary | ICD-10-CM | POA: Diagnosis not present

## 2018-01-21 DIAGNOSIS — B0052 Herpesviral keratitis: Secondary | ICD-10-CM | POA: Diagnosis not present

## 2018-01-27 ENCOUNTER — Other Ambulatory Visit: Payer: Self-pay

## 2018-01-28 DIAGNOSIS — B0052 Herpesviral keratitis: Secondary | ICD-10-CM | POA: Diagnosis not present

## 2018-02-07 DIAGNOSIS — H1089 Other conjunctivitis: Secondary | ICD-10-CM | POA: Diagnosis not present

## 2018-02-07 DIAGNOSIS — B0052 Herpesviral keratitis: Secondary | ICD-10-CM | POA: Diagnosis not present

## 2018-02-25 DIAGNOSIS — B0052 Herpesviral keratitis: Secondary | ICD-10-CM | POA: Diagnosis not present

## 2018-02-25 DIAGNOSIS — I739 Peripheral vascular disease, unspecified: Secondary | ICD-10-CM | POA: Diagnosis not present

## 2018-02-25 DIAGNOSIS — H40053 Ocular hypertension, bilateral: Secondary | ICD-10-CM | POA: Diagnosis not present

## 2018-03-16 ENCOUNTER — Other Ambulatory Visit: Payer: Self-pay | Admitting: Cardiovascular Disease

## 2018-03-16 DIAGNOSIS — H5213 Myopia, bilateral: Secondary | ICD-10-CM | POA: Diagnosis not present

## 2018-03-16 DIAGNOSIS — Z961 Presence of intraocular lens: Secondary | ICD-10-CM | POA: Diagnosis not present

## 2018-03-16 DIAGNOSIS — H52203 Unspecified astigmatism, bilateral: Secondary | ICD-10-CM | POA: Diagnosis not present

## 2018-03-16 DIAGNOSIS — H524 Presbyopia: Secondary | ICD-10-CM | POA: Diagnosis not present

## 2018-03-16 DIAGNOSIS — H40013 Open angle with borderline findings, low risk, bilateral: Secondary | ICD-10-CM | POA: Diagnosis not present

## 2018-04-20 ENCOUNTER — Encounter: Payer: Self-pay | Admitting: Cardiovascular Disease

## 2018-04-20 ENCOUNTER — Ambulatory Visit (INDEPENDENT_AMBULATORY_CARE_PROVIDER_SITE_OTHER): Payer: Medicare Other | Admitting: Cardiovascular Disease

## 2018-04-20 VITALS — BP 146/77 | HR 74 | Ht 64.0 in | Wt 138.6 lb

## 2018-04-20 DIAGNOSIS — I4819 Other persistent atrial fibrillation: Secondary | ICD-10-CM | POA: Diagnosis not present

## 2018-04-20 DIAGNOSIS — E782 Mixed hyperlipidemia: Secondary | ICD-10-CM | POA: Diagnosis not present

## 2018-04-20 DIAGNOSIS — R0609 Other forms of dyspnea: Secondary | ICD-10-CM

## 2018-04-20 DIAGNOSIS — I10 Essential (primary) hypertension: Secondary | ICD-10-CM

## 2018-04-20 DIAGNOSIS — J383 Other diseases of vocal cords: Secondary | ICD-10-CM | POA: Diagnosis not present

## 2018-04-20 NOTE — Progress Notes (Signed)
SUBJECTIVE:  The patient returns for follow-up ofpersistentatrial fibrillation.   Echocardiogram on 06/01/14 demonstrated normal left ventricular systolic function, EF 61-44%, normal regional wall motion, grade 1 diastolic dysfunction, and mild left atrial dilatation.  ECG performed in the office today which I ordered and personally interpreted demonstrates normal sinus rhythm with nonspecific ST segment abnormalities.  She denies chest pain.  She seldom has palpitations.  She was diagnosed with vocal cord dysfunction in June 2017 at [facility].  She has some exertional dyspnea alleviated with inhalers.     Review of Systems: As per "subjective", otherwise negative.  Allergies  Allergen Reactions  . Morphine And Related Nausea And Vomiting  . Sulfa Antibiotics Nausea And Vomiting  . Penicillins Rash    Current Outpatient Medications  Medication Sig Dispense Refill  . albuterol (PROVENTIL HFA;VENTOLIN HFA) 108 (90 Base) MCG/ACT inhaler Inhale 2 puffs into the lungs every 6 (six) hours as needed for wheezing or shortness of breath.    Marland Kitchen atorvastatin (LIPITOR) 20 MG tablet Take 1 tablet by mouth daily.    . Calcium Carbonate Antacid (ALKA-SELTZER ANTACID PO) Take by mouth as needed.    . Calcium-Vitamin D-Vitamin K 500-500-40 MG-UNT-MCG CHEW Chew 1 tablet by mouth daily.    . clonazePAM (KLONOPIN) 1 MG tablet Take 1 tablet (1 mg total) by mouth at bedtime. 30 tablet 0  . ELIQUIS 5 MG TABS tablet TAKE 1 TABLET BY MOUTH TWICE DAILY 180 tablet 3  . esomeprazole (NEXIUM) 20 MG capsule Take 20 mg by mouth daily at 12 noon.    . ferrous sulfate 325 (65 FE) MG tablet Take 325 mg by mouth daily.     Marland Kitchen HYDROcodone-acetaminophen (NORCO/VICODIN) 5-325 MG tablet Take 1 tablet by mouth 3 (three) times daily as needed.    Marland Kitchen levocetirizine (XYZAL) 5 MG tablet Take 5 mg by mouth every evening.    Marland Kitchen lisinopril (PRINIVIL,ZESTRIL) 5 MG tablet TAKE 1 TABLET BY MOUTH ONCE DAILY 90 tablet 0  .  metoprolol tartrate (LOPRESSOR) 25 MG tablet TAKE 1/2 (ONE-HALF) TABLET BY MOUTH TWICE DAILY 90 tablet 0  . mometasone (ELOCON) 0.1 % cream Apply 1 application topically 2 (two) times daily.    . Multiple Vitamin (MULTIVITAMIN) tablet Take 1 tablet by mouth daily.    . nitroGLYCERIN (NITROSTAT) 0.4 MG SL tablet Place 1 tablet (0.4 mg total) under the tongue every 5 (five) minutes as needed for chest pain. 25 tablet 3  . OXYGEN Inhale 2 L into the lungs at bedtime.    . sucralfate (CARAFATE) 1 g tablet Take 1 tablet (1 g total) by mouth 4 (four) times daily -  with meals and at bedtime. 120 tablet 3  . traMADol (ULTRAM) 50 MG tablet Take 1 tablet by mouth as needed.    . triamcinolone cream (KENALOG) 0.1 % Apply 1 application topically as needed.    . valACYclovir (VALTREX) 1000 MG tablet Take 1 tablet by mouth daily.    Marland Kitchen venlafaxine (EFFEXOR) 75 MG tablet Take 75 mg by mouth daily. Takes 3 tablets by mouth daily    . vitamin B-12 (CYANOCOBALAMIN) 100 MCG tablet Take 100 mcg by mouth daily.     No current facility-administered medications for this visit.     Past Medical History:  Diagnosis Date  . A-fib ([place])   . Anxiety   . Cancer ([place])    breast  . COPD (chronic obstructive pulmonary disease) ([place])   . Depression   .  Hypertension   . TIA (transient ischemic attack)    remote    Past Surgical History:  Procedure Laterality Date  . ABDOMINAL HYSTERECTOMY    . APPENDECTOMY    . BACK SURGERY     total of five surgeries  . BREAST CAPSULECTOMY WITH IMPLANT EXCHANGE Right 05/27/2016   Procedure: REMOVAL AND REPLACEMENT OF RIGHT BREAST IMPLANT AND BREAST CAPSULECTOMY;  Surgeon: Cristine Polio, MD;  Location: [facility];  Service: Plastics;  Laterality: Right;  . CARDIAC CATHETERIZATION    . CHOLECYSTECTOMY    . COLONOSCOPY  08/2014   Dr. Britta Mccreedy: Small sessile polyp at the cecum, removed, tubular adenoma  . ESOPHAGOGASTRODUODENOSCOPY  06/2013   Dr. Britta Mccreedy: Hiatal  hernia, Schatzki ring, nonobstructive.  Marland Kitchen MASTECTOMY     right   . ORIF ANKLE FRACTURE Left 06/01/2014   Procedure: OPEN REDUCTION INTERNAL FIXATION (ORIF) LEFT ANKLE FRACTURE;  Surgeon: Marybelle Killings, MD;  Location: [facility];  Service: Orthopedics;  Laterality: Left;  . ROTATOR CUFF REPAIR     left  . TOTAL HIP ARTHROPLASTY     right  . TOTAL KNEE ARTHROPLASTY     bilateral    Social History   Socioeconomic History  . Marital status: Widowed    Spouse name: Not on file  . Number of children: Not on file  . Years of education: Not on file  . Highest education level: Not on file  Occupational History  . Not on file  Social Needs  . Financial resource strain: Not on file  . Food insecurity:    Worry: Not on file    Inability: Not on file  . Transportation needs:    Medical: Not on file    Non-medical: Not on file  Tobacco Use  . Smoking status: Never Smoker  . Smokeless tobacco: Never Used  Substance and Sexual Activity  . Alcohol use: No    Alcohol/week: 0.0 standard drinks  . Drug use: No  . Sexual activity: Not on file  Lifestyle  . Physical activity:    Days per week: Not on file    Minutes per session: Not on file  . Stress: Not on file  Relationships  . Social connections:    Talks on phone: Not on file    Gets together: Not on file    Attends religious service: Not on file    Active member of club or organization: Not on file    Attends meetings of clubs or organizations: Not on file    Relationship status: Not on file  . Intimate partner violence:    Fear of current or ex partner: Not on file    Emotionally abused: Not on file    Physically abused: Not on file    Forced sexual activity: Not on file  Other Topics Concern  . Not on file  Social History Narrative  . Not on file     Vitals:   04/20/18 1258  BP: (!) 146/77  Pulse: 74  SpO2: 99%  Weight: 138 lb 9.6 oz (62.9 kg)  Height: 5\' 4"  (1.626 m)    Wt Readings from Last 3 Encounters:    04/20/18 138 lb 9.6 oz (62.9 kg)  03/18/17 147 lb 3.2 oz (66.8 kg)  10/22/16 149 lb (67.6 kg)     PHYSICAL EXAM General: NAD HEENT: Normal. Neck: No JVD, no thyromegaly. Lungs: Clear to auscultation bilaterally with normal respiratory effort. CV: Regular rate and rhythm, normal S1/S2, no S3/S4, no  murmurs. No pretibial or periankle edema.  No carotid bruit.   Abdomen: Soft, nontender, no distention.  Neurologic: Alert and oriented.  Psych: Normal affect. Skin: Normal. Musculoskeletal: No gross deformities.    ECG: Reviewed above under Subjective   Labs: Lab Results  Component Value Date/Time   K 4.0 01/27/2017 02:05 PM   BUN 16 01/27/2017 02:05 PM   CREATININE 0.75 01/27/2017 02:05 PM   ALT 21 01/27/2017 02:05 PM   TSH 2.54 02/23/2015 10:58 AM   HGB 12.5 01/27/2017 02:05 PM     Lipids: No results found for: LDLCALC, LDLDIRECT, CHOL, TRIG, HDL     ASSESSMENT AND PLAN:  1. Persistent atrial fibrillation: Symptomatically stable on metoprolol 12.5 mg twice daily. CHADSVASC 6 thus high thromboembolic risk. Continue apixaban 5 mg bid.    2. Essential HTN:  BP remains elevated.  I started lisinopril at her last visit.  This may need to be increased.  No changes today.  3. Hyperlipidemia: Continue Lipitor 20 mg.  4.  Shortness of breath: She appears to have vocal cord dysfunction and symptoms are alleviated with inhalers.  She was evaluated for this at [facility] in June 2017.   Disposition: Follow up 1 year   Kate Sable, M.D., F.A.C.C.

## 2018-04-20 NOTE — Patient Instructions (Addendum)

## 2018-05-04 DIAGNOSIS — I10 Essential (primary) hypertension: Secondary | ICD-10-CM | POA: Diagnosis not present

## 2018-05-04 DIAGNOSIS — D649 Anemia, unspecified: Secondary | ICD-10-CM | POA: Diagnosis not present

## 2018-05-04 DIAGNOSIS — E782 Mixed hyperlipidemia: Secondary | ICD-10-CM | POA: Diagnosis not present

## 2018-05-04 DIAGNOSIS — D509 Iron deficiency anemia, unspecified: Secondary | ICD-10-CM | POA: Diagnosis not present

## 2018-05-04 DIAGNOSIS — E785 Hyperlipidemia, unspecified: Secondary | ICD-10-CM | POA: Diagnosis not present

## 2018-05-06 DIAGNOSIS — K219 Gastro-esophageal reflux disease without esophagitis: Secondary | ICD-10-CM | POA: Diagnosis not present

## 2018-05-06 DIAGNOSIS — K458 Other specified abdominal hernia without obstruction or gangrene: Secondary | ICD-10-CM | POA: Diagnosis not present

## 2018-05-06 DIAGNOSIS — M4800 Spinal stenosis, site unspecified: Secondary | ICD-10-CM | POA: Diagnosis not present

## 2018-05-06 DIAGNOSIS — F39 Unspecified mood [affective] disorder: Secondary | ICD-10-CM | POA: Diagnosis not present

## 2018-05-06 DIAGNOSIS — G8929 Other chronic pain: Secondary | ICD-10-CM | POA: Diagnosis not present

## 2018-05-06 DIAGNOSIS — D509 Iron deficiency anemia, unspecified: Secondary | ICD-10-CM | POA: Diagnosis not present

## 2018-05-06 DIAGNOSIS — I10 Essential (primary) hypertension: Secondary | ICD-10-CM | POA: Diagnosis not present

## 2018-05-06 DIAGNOSIS — M545 Low back pain: Secondary | ICD-10-CM | POA: Diagnosis not present

## 2018-05-06 DIAGNOSIS — E782 Mixed hyperlipidemia: Secondary | ICD-10-CM | POA: Diagnosis not present

## 2018-05-06 DIAGNOSIS — I4891 Unspecified atrial fibrillation: Secondary | ICD-10-CM | POA: Diagnosis not present

## 2018-05-07 ENCOUNTER — Encounter: Payer: Self-pay | Admitting: Cardiovascular Disease

## 2018-05-08 ENCOUNTER — Telehealth: Payer: Self-pay | Admitting: Cardiovascular Disease

## 2018-05-08 NOTE — Telephone Encounter (Signed)
Patient called stating that she cannot afford Eliquis.

## 2018-05-08 NOTE — Telephone Encounter (Signed)
Left message to return call 

## 2018-05-13 NOTE — Telephone Encounter (Signed)
2nd attempt lmtcb

## 2018-05-13 NOTE — Telephone Encounter (Signed)
Patient states that she has paid her $300.00 deductible already.  In addition, she has had to pay $123.72 for January & February.    Call placed to [facility] - per pharm - looks like coming up for March her system is showing it will be another $123.72.    Informed patient of above.  I advised her to call the number on her card for prescription coverage so they could explain the breakdown of payments.  Also, number for BMSPAF provided ([phone]).  She will call the office back if she does not qualify for assistance.

## 2018-05-18 ENCOUNTER — Telehealth: Payer: Self-pay | Admitting: Cardiovascular Disease

## 2018-05-18 NOTE — Telephone Encounter (Signed)
Patient called stating that her insurance company denied her approval for another tier to receive Eliquis at a cheaper rate.

## 2018-05-20 DIAGNOSIS — M66361 Spontaneous rupture of flexor tendons, right lower leg: Secondary | ICD-10-CM | POA: Diagnosis not present

## 2018-05-20 DIAGNOSIS — M79671 Pain in right foot: Secondary | ICD-10-CM | POA: Diagnosis not present

## 2018-05-20 NOTE — Telephone Encounter (Signed)
Left message to return call 

## 2018-05-25 NOTE — Telephone Encounter (Signed)
Spoke with patient this morning.  Low income subsidy & good rx info provided.

## 2018-05-27 DIAGNOSIS — I10 Essential (primary) hypertension: Secondary | ICD-10-CM | POA: Diagnosis not present

## 2018-06-14 ENCOUNTER — Other Ambulatory Visit: Payer: Self-pay | Admitting: Cardiovascular Disease

## 2018-06-22 ENCOUNTER — Other Ambulatory Visit: Payer: Self-pay | Admitting: Cardiovascular Disease

## 2018-06-27 ENCOUNTER — Other Ambulatory Visit: Payer: Self-pay | Admitting: Cardiovascular Disease

## 2018-07-07 DIAGNOSIS — M4800 Spinal stenosis, site unspecified: Secondary | ICD-10-CM | POA: Diagnosis not present

## 2018-07-07 DIAGNOSIS — I10 Essential (primary) hypertension: Secondary | ICD-10-CM | POA: Diagnosis not present

## 2018-07-07 DIAGNOSIS — E782 Mixed hyperlipidemia: Secondary | ICD-10-CM | POA: Diagnosis not present

## 2018-07-07 DIAGNOSIS — K219 Gastro-esophageal reflux disease without esophagitis: Secondary | ICD-10-CM | POA: Diagnosis not present

## 2018-07-07 DIAGNOSIS — G8929 Other chronic pain: Secondary | ICD-10-CM | POA: Diagnosis not present

## 2018-07-07 DIAGNOSIS — I4891 Unspecified atrial fibrillation: Secondary | ICD-10-CM | POA: Diagnosis not present

## 2018-07-07 DIAGNOSIS — D509 Iron deficiency anemia, unspecified: Secondary | ICD-10-CM | POA: Diagnosis not present

## 2018-07-13 DIAGNOSIS — I10 Essential (primary) hypertension: Secondary | ICD-10-CM | POA: Diagnosis not present

## 2018-07-13 DIAGNOSIS — I4891 Unspecified atrial fibrillation: Secondary | ICD-10-CM | POA: Diagnosis not present

## 2018-07-13 DIAGNOSIS — K219 Gastro-esophageal reflux disease without esophagitis: Secondary | ICD-10-CM | POA: Diagnosis not present

## 2018-07-13 DIAGNOSIS — E782 Mixed hyperlipidemia: Secondary | ICD-10-CM | POA: Diagnosis not present

## 2018-07-22 DIAGNOSIS — I739 Peripheral vascular disease, unspecified: Secondary | ICD-10-CM | POA: Diagnosis not present

## 2018-07-23 ENCOUNTER — Other Ambulatory Visit (HOSPITAL_COMMUNITY): Payer: Self-pay | Admitting: Internal Medicine

## 2018-07-23 DIAGNOSIS — Z1231 Encounter for screening mammogram for malignant neoplasm of breast: Secondary | ICD-10-CM

## 2018-07-29 DIAGNOSIS — S30816A Abrasion of unspecified external genital organs, female, initial encounter: Secondary | ICD-10-CM | POA: Diagnosis not present

## 2018-08-17 ENCOUNTER — Observation Stay (HOSPITAL_COMMUNITY): Payer: Medicare Other

## 2018-08-17 DIAGNOSIS — Z882 Allergy status to sulfonamides status: Secondary | ICD-10-CM | POA: Diagnosis not present

## 2018-08-17 DIAGNOSIS — S4991XA Unspecified injury of right shoulder and upper arm, initial encounter: Secondary | ICD-10-CM | POA: Diagnosis not present

## 2018-08-17 DIAGNOSIS — Z880 Allergy status to penicillin: Secondary | ICD-10-CM | POA: Diagnosis not present

## 2018-08-17 DIAGNOSIS — S5001XA Contusion of right elbow, initial encounter: Secondary | ICD-10-CM | POA: Diagnosis not present

## 2018-08-17 DIAGNOSIS — Z885 Allergy status to narcotic agent status: Secondary | ICD-10-CM | POA: Diagnosis not present

## 2018-08-17 DIAGNOSIS — W109XXA Fall (on) (from) unspecified stairs and steps, initial encounter: Secondary | ICD-10-CM | POA: Diagnosis not present

## 2018-08-18 DIAGNOSIS — S4991XA Unspecified injury of right shoulder and upper arm, initial encounter: Secondary | ICD-10-CM | POA: Diagnosis not present

## 2018-08-19 DIAGNOSIS — E782 Mixed hyperlipidemia: Secondary | ICD-10-CM | POA: Diagnosis not present

## 2018-08-19 DIAGNOSIS — K219 Gastro-esophageal reflux disease without esophagitis: Secondary | ICD-10-CM | POA: Diagnosis not present

## 2018-08-19 DIAGNOSIS — I10 Essential (primary) hypertension: Secondary | ICD-10-CM | POA: Diagnosis not present

## 2018-08-19 DIAGNOSIS — I4891 Unspecified atrial fibrillation: Secondary | ICD-10-CM | POA: Diagnosis not present

## 2018-08-24 DIAGNOSIS — S40021A Contusion of right upper arm, initial encounter: Secondary | ICD-10-CM | POA: Insufficient documentation

## 2018-08-27 DIAGNOSIS — R0602 Shortness of breath: Secondary | ICD-10-CM | POA: Diagnosis not present

## 2018-08-27 DIAGNOSIS — F39 Unspecified mood [affective] disorder: Secondary | ICD-10-CM | POA: Diagnosis not present

## 2018-08-27 DIAGNOSIS — I10 Essential (primary) hypertension: Secondary | ICD-10-CM | POA: Diagnosis not present

## 2018-09-02 ENCOUNTER — Ambulatory Visit (HOSPITAL_COMMUNITY)
Admission: RE | Admit: 2018-09-02 | Discharge: 2018-09-02 | Disposition: A | Discharge status: Home / Self Care | Payer: Medicare Other | Source: Ambulatory Visit | Attending: Internal Medicine | Admitting: Internal Medicine

## 2018-09-02 ENCOUNTER — Encounter (HOSPITAL_COMMUNITY): Payer: Self-pay

## 2018-09-02 ENCOUNTER — Other Ambulatory Visit: Payer: Self-pay

## 2018-09-02 DIAGNOSIS — Z1231 Encounter for screening mammogram for malignant neoplasm of breast: Secondary | ICD-10-CM | POA: Insufficient documentation

## 2018-09-03 DIAGNOSIS — R0602 Shortness of breath: Secondary | ICD-10-CM | POA: Diagnosis not present

## 2018-09-03 DIAGNOSIS — I10 Essential (primary) hypertension: Secondary | ICD-10-CM | POA: Diagnosis not present

## 2018-09-15 DIAGNOSIS — E785 Hyperlipidemia, unspecified: Secondary | ICD-10-CM | POA: Diagnosis not present

## 2018-09-15 DIAGNOSIS — E782 Mixed hyperlipidemia: Secondary | ICD-10-CM | POA: Diagnosis not present

## 2018-09-15 DIAGNOSIS — I10 Essential (primary) hypertension: Secondary | ICD-10-CM | POA: Diagnosis not present

## 2018-09-15 DIAGNOSIS — D649 Anemia, unspecified: Secondary | ICD-10-CM | POA: Diagnosis not present

## 2018-09-15 DIAGNOSIS — D509 Iron deficiency anemia, unspecified: Secondary | ICD-10-CM | POA: Diagnosis not present

## 2018-09-17 DIAGNOSIS — Z7409 Other reduced mobility: Secondary | ICD-10-CM | POA: Diagnosis not present

## 2018-09-17 DIAGNOSIS — R0602 Shortness of breath: Secondary | ICD-10-CM | POA: Diagnosis not present

## 2018-09-17 DIAGNOSIS — I10 Essential (primary) hypertension: Secondary | ICD-10-CM | POA: Diagnosis not present

## 2018-09-17 DIAGNOSIS — K219 Gastro-esophageal reflux disease without esophagitis: Secondary | ICD-10-CM | POA: Diagnosis not present

## 2018-10-06 DIAGNOSIS — H40013 Open angle with borderline findings, low risk, bilateral: Secondary | ICD-10-CM | POA: Diagnosis not present

## 2018-10-06 DIAGNOSIS — I10 Essential (primary) hypertension: Secondary | ICD-10-CM | POA: Diagnosis not present

## 2018-10-06 DIAGNOSIS — K219 Gastro-esophageal reflux disease without esophagitis: Secondary | ICD-10-CM | POA: Diagnosis not present

## 2018-10-06 DIAGNOSIS — I4891 Unspecified atrial fibrillation: Secondary | ICD-10-CM | POA: Diagnosis not present

## 2018-10-06 DIAGNOSIS — E782 Mixed hyperlipidemia: Secondary | ICD-10-CM | POA: Diagnosis not present

## 2018-10-08 DIAGNOSIS — I739 Peripheral vascular disease, unspecified: Secondary | ICD-10-CM | POA: Diagnosis not present

## 2018-10-19 DIAGNOSIS — I739 Peripheral vascular disease, unspecified: Secondary | ICD-10-CM | POA: Diagnosis not present

## 2018-10-20 DIAGNOSIS — K219 Gastro-esophageal reflux disease without esophagitis: Secondary | ICD-10-CM | POA: Diagnosis not present

## 2018-10-20 DIAGNOSIS — I10 Essential (primary) hypertension: Secondary | ICD-10-CM | POA: Diagnosis not present

## 2018-10-20 DIAGNOSIS — I4891 Unspecified atrial fibrillation: Secondary | ICD-10-CM | POA: Diagnosis not present

## 2018-10-20 DIAGNOSIS — E782 Mixed hyperlipidemia: Secondary | ICD-10-CM | POA: Diagnosis not present

## 2018-11-03 DIAGNOSIS — D509 Iron deficiency anemia, unspecified: Secondary | ICD-10-CM | POA: Diagnosis not present

## 2018-11-03 DIAGNOSIS — I10 Essential (primary) hypertension: Secondary | ICD-10-CM | POA: Diagnosis not present

## 2018-11-03 DIAGNOSIS — D649 Anemia, unspecified: Secondary | ICD-10-CM | POA: Diagnosis not present

## 2018-11-03 DIAGNOSIS — Z1329 Encounter for screening for other suspected endocrine disorder: Secondary | ICD-10-CM | POA: Diagnosis not present

## 2018-11-03 DIAGNOSIS — E782 Mixed hyperlipidemia: Secondary | ICD-10-CM | POA: Diagnosis not present

## 2018-11-03 DIAGNOSIS — E785 Hyperlipidemia, unspecified: Secondary | ICD-10-CM | POA: Diagnosis not present

## 2018-11-04 DIAGNOSIS — K219 Gastro-esophageal reflux disease without esophagitis: Secondary | ICD-10-CM | POA: Diagnosis not present

## 2018-11-04 DIAGNOSIS — R0602 Shortness of breath: Secondary | ICD-10-CM | POA: Diagnosis not present

## 2018-11-04 DIAGNOSIS — Z7409 Other reduced mobility: Secondary | ICD-10-CM | POA: Diagnosis not present

## 2018-11-04 DIAGNOSIS — I10 Essential (primary) hypertension: Secondary | ICD-10-CM | POA: Diagnosis not present

## 2018-11-09 DIAGNOSIS — I739 Peripheral vascular disease, unspecified: Secondary | ICD-10-CM | POA: Diagnosis not present

## 2018-11-23 DIAGNOSIS — Z23 Encounter for immunization: Secondary | ICD-10-CM | POA: Diagnosis not present

## 2018-12-07 DIAGNOSIS — M25579 Pain in unspecified ankle and joints of unspecified foot: Secondary | ICD-10-CM | POA: Diagnosis not present

## 2018-12-07 DIAGNOSIS — M79672 Pain in left foot: Secondary | ICD-10-CM | POA: Diagnosis not present

## 2018-12-07 DIAGNOSIS — M79671 Pain in right foot: Secondary | ICD-10-CM | POA: Diagnosis not present

## 2018-12-08 DIAGNOSIS — S30816A Abrasion of unspecified external genital organs, female, initial encounter: Secondary | ICD-10-CM | POA: Diagnosis not present

## 2018-12-15 ENCOUNTER — Other Ambulatory Visit: Payer: Self-pay

## 2018-12-15 ENCOUNTER — Other Ambulatory Visit (HOSPITAL_COMMUNITY): Payer: Self-pay | Admitting: Adult Health Nurse Practitioner

## 2018-12-15 ENCOUNTER — Ambulatory Visit (HOSPITAL_COMMUNITY)
Admission: RE | Admit: 2018-12-15 | Discharge: 2018-12-15 | Disposition: A | Discharge status: Home / Self Care | Payer: Medicare Other | Source: Ambulatory Visit | Attending: Adult Health Nurse Practitioner | Admitting: Adult Health Nurse Practitioner

## 2018-12-15 DIAGNOSIS — R0602 Shortness of breath: Secondary | ICD-10-CM

## 2018-12-28 DIAGNOSIS — I739 Peripheral vascular disease, unspecified: Secondary | ICD-10-CM | POA: Diagnosis not present

## 2019-01-01 DIAGNOSIS — Z853 Personal history of malignant neoplasm of breast: Secondary | ICD-10-CM | POA: Diagnosis not present

## 2019-01-01 DIAGNOSIS — X501XXA Overexertion from prolonged static or awkward postures, initial encounter: Secondary | ICD-10-CM | POA: Diagnosis not present

## 2019-01-01 DIAGNOSIS — K219 Gastro-esophageal reflux disease without esophagitis: Secondary | ICD-10-CM | POA: Diagnosis not present

## 2019-01-01 DIAGNOSIS — Z7982 Long term (current) use of aspirin: Secondary | ICD-10-CM | POA: Diagnosis not present

## 2019-01-01 DIAGNOSIS — S92355A Nondisplaced fracture of fifth metatarsal bone, left foot, initial encounter for closed fracture: Secondary | ICD-10-CM | POA: Diagnosis not present

## 2019-01-01 DIAGNOSIS — S92352A Displaced fracture of fifth metatarsal bone, left foot, initial encounter for closed fracture: Secondary | ICD-10-CM | POA: Diagnosis not present

## 2019-01-01 DIAGNOSIS — Z79899 Other long term (current) drug therapy: Secondary | ICD-10-CM | POA: Diagnosis not present

## 2019-01-01 DIAGNOSIS — I10 Essential (primary) hypertension: Secondary | ICD-10-CM | POA: Diagnosis not present

## 2019-01-01 DIAGNOSIS — E7800 Pure hypercholesterolemia, unspecified: Secondary | ICD-10-CM | POA: Diagnosis not present

## 2019-01-11 DIAGNOSIS — I10 Essential (primary) hypertension: Secondary | ICD-10-CM | POA: Diagnosis not present

## 2019-01-11 DIAGNOSIS — D509 Iron deficiency anemia, unspecified: Secondary | ICD-10-CM | POA: Diagnosis not present

## 2019-01-11 DIAGNOSIS — I4891 Unspecified atrial fibrillation: Secondary | ICD-10-CM | POA: Diagnosis not present

## 2019-01-11 DIAGNOSIS — E782 Mixed hyperlipidemia: Secondary | ICD-10-CM | POA: Diagnosis not present

## 2019-01-11 DIAGNOSIS — G8929 Other chronic pain: Secondary | ICD-10-CM | POA: Diagnosis not present

## 2019-01-11 DIAGNOSIS — S92352A Displaced fracture of fifth metatarsal bone, left foot, initial encounter for closed fracture: Secondary | ICD-10-CM | POA: Insufficient documentation

## 2019-01-11 DIAGNOSIS — K219 Gastro-esophageal reflux disease without esophagitis: Secondary | ICD-10-CM | POA: Diagnosis not present

## 2019-01-11 DIAGNOSIS — M4800 Spinal stenosis, site unspecified: Secondary | ICD-10-CM | POA: Diagnosis not present

## 2019-01-26 DIAGNOSIS — S92352D Displaced fracture of fifth metatarsal bone, left foot, subsequent encounter for fracture with routine healing: Secondary | ICD-10-CM | POA: Diagnosis not present

## 2019-02-03 ENCOUNTER — Other Ambulatory Visit: Payer: Self-pay

## 2019-02-08 DIAGNOSIS — M79672 Pain in left foot: Secondary | ICD-10-CM | POA: Diagnosis not present

## 2019-02-08 DIAGNOSIS — M79671 Pain in right foot: Secondary | ICD-10-CM | POA: Diagnosis not present

## 2019-02-08 DIAGNOSIS — M25579 Pain in unspecified ankle and joints of unspecified foot: Secondary | ICD-10-CM | POA: Diagnosis not present

## 2019-02-22 DIAGNOSIS — I739 Peripheral vascular disease, unspecified: Secondary | ICD-10-CM | POA: Diagnosis not present

## 2019-03-04 ENCOUNTER — Other Ambulatory Visit (HOSPITAL_COMMUNITY): Payer: Self-pay | Admitting: Internal Medicine

## 2019-03-04 DIAGNOSIS — Z13820 Encounter for screening for osteoporosis: Secondary | ICD-10-CM

## 2019-03-08 ENCOUNTER — Telehealth: Payer: Self-pay | Admitting: Cardiovascular Disease

## 2019-03-08 NOTE — Telephone Encounter (Signed)
PCP sent  Message....s/s of heart failure, her last echo was in 2016 and she has some SOB and peripheral edema.   Left patient message to offer Virtual appointment

## 2019-03-09 NOTE — Telephone Encounter (Signed)
Patient notified and verbalized understanding.  OV scheduled with Estella Husk, PA for Monday, 03/15/2019 at 2:00 in the [place] office.

## 2019-03-10 NOTE — Progress Notes (Deleted)
Cardiology Office Note    Date:  03/10/2019   ID:  Amanda Palmer, [place] 1933-08-06, MRN [id number]  PCP:  Celene Squibb, MD  Cardiologist: Kate Sable, MD EPS: None  No chief complaint on file.   History of Present Illness:  Amanda Palmer is a 85 y.o. female with history of paroxysmal Afib CHADSVASC=6 on eliquis, HTN, HLD, vocal cord dysfunction, echo 2016 normal LVEF 55-60%, grade 1 DD.  Last saw Dr. Bronson Ing 04/2018 and had some shortness of breath relieved with inhalers.Was in [place] that day.  Patient added onto my schedule at the request of PCP for worsening shortness of breath and edema.    Past Medical History:  Diagnosis Date  . A-fib ([place])   . Anxiety   . Cancer ([place])    breast  . COPD (chronic obstructive pulmonary disease) ([place])   . Depression   . Hypertension   . TIA (transient ischemic attack)    remote    Past Surgical History:  Procedure Laterality Date  . ABDOMINAL HYSTERECTOMY    . APPENDECTOMY    . BACK SURGERY     total of five surgeries  . BREAST BIOPSY Left    benign  . BREAST CAPSULECTOMY WITH IMPLANT EXCHANGE Right 05/27/2016   Procedure: REMOVAL AND REPLACEMENT OF RIGHT BREAST IMPLANT AND BREAST CAPSULECTOMY;  Surgeon: Cristine Polio, MD;  Location: [facility];  Service: Plastics;  Laterality: Right;  . CARDIAC CATHETERIZATION    . CHOLECYSTECTOMY    . COLONOSCOPY  08/2014   Dr. Britta Mccreedy: Small sessile polyp at the cecum, removed, tubular adenoma  . ESOPHAGOGASTRODUODENOSCOPY  06/2013   Dr. Britta Mccreedy: Hiatal hernia, Schatzki ring, nonobstructive.  Marland Kitchen MASTECTOMY     right   . ORIF ANKLE FRACTURE Left 06/01/2014   Procedure: OPEN REDUCTION INTERNAL FIXATION (ORIF) LEFT ANKLE FRACTURE;  Surgeon: Marybelle Killings, MD;  Location: [facility];  Service: Orthopedics;  Laterality: Left;  . ROTATOR CUFF REPAIR     left  . TOTAL HIP ARTHROPLASTY     right  . TOTAL KNEE ARTHROPLASTY     bilateral    Current Medications: No outpatient  medications have been marked as taking for the 03/15/19 encounter (Appointment) with Imogene Burn, PA-C.     Allergies:   Morphine and related, Sulfa antibiotics, and Penicillins   Social History   Socioeconomic History  . Marital status: Widowed    Spouse name: Not on file  . Number of children: Not on file  . Years of education: Not on file  . Highest education level: Not on file  Occupational History  . Not on file  Tobacco Use  . Smoking status: Never Smoker  . Smokeless tobacco: Never Used  Substance and Sexual Activity  . Alcohol use: No    Alcohol/week: 0.0 standard drinks  . Drug use: No  . Sexual activity: Not on file  Other Topics Concern  . Not on file  Social History Narrative  . Not on file   Social Determinants of Health   Financial Resource Strain:   . Difficulty of Paying Living Expenses: Not on file  Food Insecurity:   . Worried About Charity fundraiser in the Last Year: Not on file  . Ran Out of Food in the Last Year: Not on file  Transportation Needs:   . Lack of Transportation (Medical): Not on file  . Lack of Transportation (Non-Medical): Not on file  Physical Activity:   . Days  of Exercise per Week: Not on file  . Minutes of Exercise per Session: Not on file  Stress:   . Feeling of Stress : Not on file  Social Connections:   . Frequency of Communication with Friends and Family: Not on file  . Frequency of Social Gatherings with Friends and Family: Not on file  . Attends Religious Services: Not on file  . Active Member of Clubs or Organizations: Not on file  . Attends Archivist Meetings: Not on file  . Marital Status: Not on file     Family History:  The patient's ***family history includes Breast cancer in her sister, sister and another family member; Heart disease in her brother and sister; Leukemia in her brother.   ROS:   Please see the history of present illness.    ROS All other systems reviewed and are  negative.   PHYSICAL EXAM:   VS:  There were no vitals taken for this visit.  Physical Exam  GEN: Well nourished, well developed, in no acute distress  HEENT: normal  Neck: no JVD, carotid bruits, or masses Cardiac:RRR; no murmurs, rubs, or gallops  Respiratory:  clear to auscultation bilaterally, normal work of breathing GI: soft, nontender, nondistended, + BS Ext: without cyanosis, clubbing, or edema, Good distal pulses bilaterally MS: no deformity or atrophy  Skin: warm and dry, no rash Neuro:  Alert and Oriented x 3, Strength and sensation are intact Psych: euthymic mood, full affect  Wt Readings from Last 3 Encounters:  04/20/18 138 lb 9.6 oz (62.9 kg)  03/18/17 147 lb 3.2 oz (66.8 kg)  10/22/16 149 lb (67.6 kg)      Studies/Labs Reviewed:   EKG:  EKG is*** ordered today.  The ekg ordered today demonstrates ***  Recent Labs: No results found for requested labs within last 8760 hours.   Lipid Panel No results found for: CHOL, TRIG, HDL, CHOLHDL, VLDL, LDLCALC, LDLDIRECT  Additional studies/ records that were reviewed today include:  ***    ASSESSMENT:    1. Dyspnea, unspecified type   2. Paroxysmal atrial fibrillation (HCC)   3. Essential hypertension   4. Hyperlipidemia, unspecified hyperlipidemia type      PLAN:  In order of problems listed above:  Shortness of beath and edema  PAF CHADSVASC=6 on eliquis  Essential HTN  HLD on Lipitor    Medication Adjustments/Labs and Tests Ordered: Current medicines are reviewed at length with the patient today.  Concerns regarding medicines are outlined above.  Medication changes, Labs and Tests ordered today are listed in the Patient Instructions below. There are no Patient Instructions on file for this visit.   Signed, Ermalinda Barrios, PA-C  03/10/2019 8:45 AM    [facility] [address], [place], [place]  [address] Phone: [phone]; Fax: [phone]

## 2019-03-11 ENCOUNTER — Other Ambulatory Visit (HOSPITAL_COMMUNITY): Payer: Medicare Other

## 2019-03-15 ENCOUNTER — Ambulatory Visit: Payer: Medicare Other | Admitting: Physician Assistant

## 2019-03-15 DIAGNOSIS — D509 Iron deficiency anemia, unspecified: Secondary | ICD-10-CM | POA: Diagnosis not present

## 2019-03-15 DIAGNOSIS — K219 Gastro-esophageal reflux disease without esophagitis: Secondary | ICD-10-CM | POA: Diagnosis not present

## 2019-03-15 DIAGNOSIS — I10 Essential (primary) hypertension: Secondary | ICD-10-CM | POA: Diagnosis not present

## 2019-03-15 DIAGNOSIS — I4891 Unspecified atrial fibrillation: Secondary | ICD-10-CM | POA: Diagnosis not present

## 2019-03-15 DIAGNOSIS — M4800 Spinal stenosis, site unspecified: Secondary | ICD-10-CM | POA: Diagnosis not present

## 2019-03-15 DIAGNOSIS — G8929 Other chronic pain: Secondary | ICD-10-CM | POA: Diagnosis not present

## 2019-03-15 DIAGNOSIS — E782 Mixed hyperlipidemia: Secondary | ICD-10-CM | POA: Diagnosis not present

## 2019-03-17 ENCOUNTER — Encounter: Payer: Self-pay | Admitting: *Deleted

## 2019-03-18 ENCOUNTER — Ambulatory Visit: Payer: Medicare Other | Admitting: Cardiovascular Disease

## 2019-03-18 ENCOUNTER — Encounter: Payer: Self-pay | Admitting: Cardiovascular Disease

## 2019-03-18 ENCOUNTER — Other Ambulatory Visit: Payer: Self-pay

## 2019-03-18 ENCOUNTER — Ambulatory Visit (INDEPENDENT_AMBULATORY_CARE_PROVIDER_SITE_OTHER): Payer: Medicare HMO | Admitting: Cardiovascular Disease

## 2019-03-18 ENCOUNTER — Other Ambulatory Visit (HOSPITAL_COMMUNITY): Payer: Medicare Other

## 2019-03-18 VITALS — BP 142/75 | HR 68 | Ht 64.0 in | Wt 140.0 lb

## 2019-03-18 DIAGNOSIS — J383 Other diseases of vocal cords: Secondary | ICD-10-CM

## 2019-03-18 DIAGNOSIS — I4819 Other persistent atrial fibrillation: Secondary | ICD-10-CM | POA: Diagnosis not present

## 2019-03-18 DIAGNOSIS — E782 Mixed hyperlipidemia: Secondary | ICD-10-CM

## 2019-03-18 DIAGNOSIS — I10 Essential (primary) hypertension: Secondary | ICD-10-CM

## 2019-03-18 MED ORDER — METOPROLOL TARTRATE 25 MG PO TABS: 25.0000 mg | ORAL_TABLET | Freq: Two times a day (BID) | ORAL | 3 refills | Status: DC

## 2019-03-18 NOTE — Patient Instructions (Signed)
Medication Instructions:   Increase Lopressor to 25mg  twice a day.  Continue all other medications.    Labwork: none  Testing/Procedures: none  Follow-Up: 6 weeks   Any Other Special Instructions Will Be Listed Below (If Applicable).  If you need a refill on your cardiac medications before your next appointment, please call your pharmacy.

## 2019-03-18 NOTE — Progress Notes (Signed)
SUBJECTIVE: The patient presents for routine follow-up.  Her PCP had sent our office a message in late December stating that the patient has some shortness of breath and peripheral edema.  She has a history of persistent atrial fibrillation.  Echocardiogram on 06/01/14 demonstrated normal left ventricular systolic function, EF 0000000, normal regional wall motion, grade 1 diastolic dysfunction, and mild left atrial dilatation.  She was diagnosed with vocal cord dysfunction in June 2017 at [facility].  She has some exertional dyspnea alleviated with inhalers.  I reviewed labs checked by her PCP in December 2020 which showed normal hemoglobin, renal function, and thyroid function.  Upon speaking with her, it appears back in late December she had an episode of tachycardia and palpitations.  She had been standing in the kitchen and bent over to do something and became weak.  She sat in a chair and checked her heart rate and it was ranging from the 170-190 range.  It eventually returned to normal on its own.  She felt weak and tired afterwards.  She currently denies chest pain, palpitations, leg swelling, shortness of breath.    Review of Systems: As per "subjective", otherwise negative.  Allergies  Allergen Reactions  . Morphine And Related Nausea And Vomiting  . Sulfa Antibiotics Nausea And Vomiting  . Penicillins Rash    Current Outpatient Medications  Medication Sig Dispense Refill  . albuterol (PROVENTIL HFA;VENTOLIN HFA) 108 (90 Base) MCG/ACT inhaler Inhale 2 puffs into the lungs every 6 (six) hours as needed for wheezing or shortness of breath.    Marland Kitchen atorvastatin (LIPITOR) 20 MG tablet Take 1 tablet by mouth daily.    . calcium carbonate (OS-CAL - DOSED IN MG OF ELEMENTAL CALCIUM) 1250 (500 Ca) MG tablet Take 1 tablet by mouth daily with breakfast.    . clonazePAM (KLONOPIN) 1 MG tablet Take 1 tablet (1 mg total) by mouth at bedtime. 30 tablet 0  . ELIQUIS 5 MG TABS tablet  Take 1 tablet by mouth twice daily 60 tablet 6  . esomeprazole (NEXIUM) 20 MG capsule Take 20 mg by mouth daily at 12 noon.    . hydrochlorothiazide (HYDRODIURIL) 12.5 MG tablet Take 1 tablet by mouth daily.    Marland Kitchen losartan (COZAAR) 50 MG tablet Take 1 tablet by mouth daily.    . metoprolol tartrate (LOPRESSOR) 25 MG tablet Take 1/2 (one-half) tablet by mouth twice daily 90 tablet 3  . Multiple Vitamin (MULTIVITAMIN) tablet Take 1 tablet by mouth daily.    . nitroGLYCERIN (NITROSTAT) 0.4 MG SL tablet Place 1 tablet (0.4 mg total) under the tongue every 5 (five) minutes as needed for chest pain. 25 tablet 3  . OXYGEN Inhale 2 L into the lungs at bedtime.    . triamcinolone cream (KENALOG) 0.1 % Apply 1 application topically as needed.    . venlafaxine (EFFEXOR) 75 MG tablet Take 150 mg by mouth every morning. & 75 mg in the evening    . vitamin B-12 (CYANOCOBALAMIN) 100 MCG tablet Take 200 mcg by mouth daily.      No current facility-administered medications for this visit.    Past Medical History:  Diagnosis Date  . A-fib ([place])   . Anxiety   . Cancer ([place])    breast  . COPD (chronic obstructive pulmonary disease) ([place])   . Depression   . Hypertension   . TIA (transient ischemic attack)    remote    Past Surgical History:  Procedure Laterality  Date  . ABDOMINAL HYSTERECTOMY    . APPENDECTOMY    . BACK SURGERY     total of five surgeries  . BREAST BIOPSY Left    benign  . BREAST CAPSULECTOMY WITH IMPLANT EXCHANGE Right 05/27/2016   Procedure: REMOVAL AND REPLACEMENT OF RIGHT BREAST IMPLANT AND BREAST CAPSULECTOMY;  Surgeon: Cristine Polio, MD;  Location: [place];  Service: Plastics;  Laterality: Right;  . CARDIAC CATHETERIZATION    . CHOLECYSTECTOMY    . COLONOSCOPY  08/2014   Dr. Britta Mccreedy: Small sessile polyp at the cecum, removed, tubular adenoma  . ESOPHAGOGASTRODUODENOSCOPY  06/2013   Dr. Britta Mccreedy: Hiatal hernia, Schatzki ring, nonobstructive.  Marland Kitchen MASTECTOMY      right   . ORIF ANKLE FRACTURE Left 06/01/2014   Procedure: OPEN REDUCTION INTERNAL FIXATION (ORIF) LEFT ANKLE FRACTURE;  Surgeon: Marybelle Killings, MD;  Location: [place];  Service: Orthopedics;  Laterality: Left;  . ROTATOR CUFF REPAIR     left  . TOTAL HIP ARTHROPLASTY     right  . TOTAL KNEE ARTHROPLASTY     bilateral    Social History   Socioeconomic History  . Marital status: Widowed    Spouse name: Not on file  . Number of children: Not on file  . Years of education: Not on file  . Highest education level: Not on file  Occupational History  . Not on file  Tobacco Use  . Smoking status: Never Smoker  . Smokeless tobacco: Never Used  Substance and Sexual Activity  . Alcohol use: No    Alcohol/week: 0.0 standard drinks  . Drug use: No  . Sexual activity: Not on file  Other Topics Concern  . Not on file  Social History Narrative  . Not on file   Social Determinants of Health   Financial Resource Strain:   . Difficulty of Paying Living Expenses: Not on file  Food Insecurity:   . Worried About Charity fundraiser in the Last Year: Not on file  . Ran Out of Food in the Last Year: Not on file  Transportation Needs:   . Lack of Transportation (Medical): Not on file  . Lack of Transportation (Non-Medical): Not on file  Physical Activity:   . Days of Exercise per Week: Not on file  . Minutes of Exercise per Session: Not on file  Stress:   . Feeling of Stress : Not on file  Social Connections:   . Frequency of Communication with Friends and Family: Not on file  . Frequency of Social Gatherings with Friends and Family: Not on file  . Attends Religious Services: Not on file  . Active Member of Clubs or Organizations: Not on file  . Attends Archivist Meetings: Not on file  . Marital Status: Not on file  Intimate Partner Violence:   . Fear of Current or Ex-Partner: Not on file  . Emotionally Abused: Not on file  . Physically Abused: Not on file  . Sexually  Abused: Not on file     Vitals:   03/18/19 1001  BP: (!) 142/75  Pulse: 68  SpO2: 98%  Weight: 140 lb (63.5 kg)  Height: 5\' 4"  (1.626 m)    Wt Readings from Last 3 Encounters:  03/18/19 140 lb (63.5 kg)  04/20/18 138 lb 9.6 oz (62.9 kg)  03/18/17 147 lb 3.2 oz (66.8 kg)     PHYSICAL EXAM General: NAD HEENT: Normal. Neck: No JVD, no thyromegaly. Lungs: Clear to auscultation  bilaterally with normal respiratory effort. CV: Regular rate and rhythm, normal S1/S2, no S3/S4, no murmur. No pretibial or periankle edema.   Abdomen: Soft, nontender, no distention.  Neurologic: Alert and oriented.  Psych: Normal affect. Skin: Normal. Musculoskeletal: No gross deformities.      Labs: Lab Results  Component Value Date/Time   K 4.0 01/27/2017 02:05 PM   BUN 16 01/27/2017 02:05 PM   CREATININE 0.75 01/27/2017 02:05 PM   ALT 21 01/27/2017 02:05 PM   TSH 2.54 02/23/2015 10:58 AM   HGB 12.5 01/27/2017 02:05 PM     Lipids: No results found for: LDLCALC, LDLDIRECT, CHOL, TRIG, HDL     ASSESSMENT AND PLAN: 1. Persistent atrial fibrillation: She has had more frequent palpitations recently on metoprolol 12.5 mg twice daily.  I will increase to 25 mg twice daily.  CHADSVASC 6 thus high thromboembolic risk. Continue apixaban 5 mg bid.  2. Essential HTN: BP is mildly elevated.    I will monitor given increase of metoprolol dosage.  3. Hyperlipidemia: Continue Lipitor 20 mg.  4.  Shortness of breath: She appears to have vocal cord dysfunction and symptoms are alleviated with inhalers.  She was evaluated for this at [facility] in June 2017.    Disposition: Follow up virtual visit in 6 weeks   Kate Sable, M.D., F.A.C.C.

## 2019-03-18 NOTE — Addendum Note (Signed)
Addended by: Laurine Blazer on: 03/18/2019 10:29 AM   Modules accepted: Orders

## 2019-03-22 DIAGNOSIS — M25579 Pain in unspecified ankle and joints of unspecified foot: Secondary | ICD-10-CM | POA: Diagnosis not present

## 2019-03-22 DIAGNOSIS — M79671 Pain in right foot: Secondary | ICD-10-CM | POA: Diagnosis not present

## 2019-03-22 DIAGNOSIS — M79672 Pain in left foot: Secondary | ICD-10-CM | POA: Diagnosis not present

## 2019-03-25 ENCOUNTER — Ambulatory Visit (HOSPITAL_COMMUNITY)
Admission: RE | Admit: 2019-03-25 | Discharge: 2019-03-25 | Disposition: A | Discharge status: Home / Self Care | Payer: Medicare HMO | Source: Ambulatory Visit | Attending: Internal Medicine | Admitting: Internal Medicine

## 2019-03-25 ENCOUNTER — Other Ambulatory Visit: Payer: Self-pay

## 2019-03-25 DIAGNOSIS — M810 Age-related osteoporosis without current pathological fracture: Secondary | ICD-10-CM | POA: Diagnosis not present

## 2019-03-25 DIAGNOSIS — Z13820 Encounter for screening for osteoporosis: Secondary | ICD-10-CM | POA: Diagnosis not present

## 2019-03-25 DIAGNOSIS — Z780 Asymptomatic menopausal state: Secondary | ICD-10-CM | POA: Diagnosis not present

## 2019-04-06 DIAGNOSIS — E785 Hyperlipidemia, unspecified: Secondary | ICD-10-CM | POA: Diagnosis not present

## 2019-04-06 DIAGNOSIS — K219 Gastro-esophageal reflux disease without esophagitis: Secondary | ICD-10-CM | POA: Diagnosis not present

## 2019-04-06 DIAGNOSIS — I482 Chronic atrial fibrillation, unspecified: Secondary | ICD-10-CM | POA: Diagnosis not present

## 2019-04-06 DIAGNOSIS — M818 Other osteoporosis without current pathological fracture: Secondary | ICD-10-CM | POA: Diagnosis not present

## 2019-04-06 DIAGNOSIS — R0602 Shortness of breath: Secondary | ICD-10-CM | POA: Diagnosis not present

## 2019-04-06 DIAGNOSIS — R69 Illness, unspecified: Secondary | ICD-10-CM | POA: Diagnosis not present

## 2019-04-06 DIAGNOSIS — Z7409 Other reduced mobility: Secondary | ICD-10-CM | POA: Diagnosis not present

## 2019-04-06 DIAGNOSIS — I10 Essential (primary) hypertension: Secondary | ICD-10-CM | POA: Diagnosis not present

## 2019-04-13 DIAGNOSIS — H40013 Open angle with borderline findings, low risk, bilateral: Secondary | ICD-10-CM | POA: Diagnosis not present

## 2019-04-13 DIAGNOSIS — Z961 Presence of intraocular lens: Secondary | ICD-10-CM | POA: Diagnosis not present

## 2019-04-19 DIAGNOSIS — I739 Peripheral vascular disease, unspecified: Secondary | ICD-10-CM | POA: Diagnosis not present

## 2019-04-23 NOTE — Progress Notes (Signed)
Synopsis: Dyspnea    Assessment & Plan:  Problem 1 Episodic dyspnea:  Would agree this is all UACS vs. VCD but her PFTs are truly abnormal.  Differential for this and PFTs is advanced kyphoscoliosis + UACS/VCD (more likely since DLCO/VA was only slightly down) vs. Occult ILD.  SLP exercises for VCD were not helpful.  A CT chest would be beneficial and trial of stronger inhaler.  If CT normal and inhaler of no benefit, not sure much more I can add to help her feel better.  - CT Chest ILD protocol - Trial of breo and flutter valve - RTC 1 mo in person, she is too hard of hearing to be able to do visits over phone  MDM . I reviewed prior external note(s) from SLP Lourdes Sledge on 08/15/15 noting vocal cord atrophy and high concern for VCD to explain dyspnea. . I reviewed the result(s) of sleep study 2017 (benign, added o2 for sleep talking for unclear reason), PFTs 2016 showing severe obstruction without BD response, air trapping, and severely reduced DLCO correcting nearly completely for VA raising question of concomittant restriction, her low FET makes true interpretation difficult.  No evidence of tracheal or extra-tracheal obstuction based on shape of flow-volume curves.  Echo 2015-2016 have been normal. . I have ordered CT Chest, Breo, flutter valve  Review of patient's Oct 2020 CXR images reveal kyphosis and no evidence of interstitial lung disease or COPD.  The patient's images have been independently reviewed by me.      End of visit medications:  Current Outpatient Medications:  .  albuterol (PROVENTIL HFA;VENTOLIN HFA) 108 (90 Base) MCG/ACT inhaler, Inhale 2 puffs into the lungs every 6 (six) hours as needed for wheezing or shortness of breath., Disp: , Rfl:  .  atorvastatin (LIPITOR) 20 MG tablet, Take 1 tablet by mouth daily., Disp: , Rfl:  .  calcium carbonate (OS-CAL - DOSED IN MG OF ELEMENTAL CALCIUM) 1250 (500 Ca) MG tablet, Take 1 tablet by mouth daily with breakfast., Disp: ,  Rfl:  .  clonazePAM (KLONOPIN) 1 MG tablet, Take 1 tablet (1 mg total) by mouth at bedtime., Disp: 30 tablet, Rfl: 0 .  ELIQUIS 5 MG TABS tablet, Take 1 tablet by mouth twice daily, Disp: 60 tablet, Rfl: 6 .  esomeprazole (NEXIUM) 20 MG capsule, Take 20 mg by mouth daily at 12 noon., Disp: , Rfl:  .  hydrochlorothiazide (HYDRODIURIL) 12.5 MG tablet, Take 1 tablet by mouth daily., Disp: , Rfl:  .  losartan (COZAAR) 50 MG tablet, Take 1 tablet by mouth daily., Disp: , Rfl:  .  metoprolol tartrate (LOPRESSOR) 25 MG tablet, Take 1 tablet (25 mg total) by mouth 2 (two) times daily., Disp: 180 tablet, Rfl: 3 .  Multiple Vitamin (MULTIVITAMIN) tablet, Take 1 tablet by mouth daily., Disp: , Rfl:  .  nitroGLYCERIN (NITROSTAT) 0.4 MG SL tablet, Place 1 tablet (0.4 mg total) under the tongue every 5 (five) minutes as needed for chest pain., Disp: 25 tablet, Rfl: 3 .  OXYGEN, Inhale 2 L into the lungs at bedtime., Disp: , Rfl:  .  triamcinolone cream (KENALOG) 0.1 %, Apply 1 application topically as needed., Disp: , Rfl:  .  venlafaxine (EFFEXOR) 75 MG tablet, Take 150 mg by mouth every morning. & 75 mg in the evening, Disp: , Rfl:  .  vitamin B-12 (CYANOCOBALAMIN) 100 MCG tablet, Take 200 mcg by mouth daily. , Disp: , Rfl:  .  fluticasone furoate-vilanterol (BREO ELLIPTA)  100-25 MCG/INH AEPB, Inhale 1 puff into the lungs daily., Disp: 30 each, Rfl: 11 .  fluticasone furoate-vilanterol (BREO ELLIPTA) 100-25 MCG/INH AEPB, Inhale 1 puff into the lungs daily., Disp: 28 each, Rfl: 0 .  Respiratory Therapy Supplies (FLUTTER) DEVI, 1 Device by Does not apply route in the morning, at noon, in the evening, and at bedtime., Disp: 1 each, Rfl: 0   Candee Furbish, MD [facility] 04/27/2019 12:45 PM    Subjective:   PATIENT ID: Amanda Palmer Reveal GENDER: female DOB: 07/29/1933, MRN: [id number]  Chief Complaint  Patient presents with  . Consult    Patient is here for shortness of breath and COPD?Marland Kitchen  Patient is on 2L oxygen at night.    HPI Chronic cough x 20 years, raspy voice and DOE seen by Dr. Melvyn Novas back in 2017.  Wert's initial review as follows:    "   Follow-up    pt. states breathing is up and down. SOB. wheezing. prod. cough yellow in color. occ. chest tightness.   symptoms are mostly noticeable during the day and rarely wake her up at night. She is bothered by episodes where she can't breathe for one breath in the next breath she's fine almost like choking. She is very easily confused with details of care and identifies her medicines by the color and I was not able to ascertain as to whether she is actually following instructions she was given previously. She was not able to tolerate even the lowest dose of prednisone whereas previously she told me it seemed to help her.  No obvious day to day or daytime variability or assoc excess/ def purulent sputum or mucus plugs or hemoptysis or cp or chest tightness, subjective wheeze or overt sinus or hb symptoms. No unusual exp hx or h/o childhood pna/ asthma or knowledge of premature birth.  Sleeping ok without nocturnal  or early am exacerbation  of respiratory  c/o's or need for noct saba. Also denies any obvious fluctuation of symptoms with weather PFT's 12/07/14  FEV1  0.86 (43%) ratio 65 with no resp to [place] and dlco 22 corrects to 83% for alv vol  02/23/2015  Walked RA x 3 laps @ 185 ft each stopped due to  End of study, nl pace, min sob/ no  desat   - allergy profile 02/23/15 >   IgE 4 with neg RAST "  Trial of pulmonary rehab unhelpful.  There was hiigh suspicion for UACS vs. VCD.  Started on gabapentin and referred to ENT:  Dr. Rowe Clack with ENT noted vocal cord atrophy, did not think dyspnea was coming from her larynx although VCD was still in differential.   Pertinent social hx includes extensive exposure to secondhand smoke and worked in [employer] for 20+ years.  In my history: - cannot make it more than 100 ft  down hallway without gasping for air - random bouts of wheezing with some relief after using albuterol - no association with breathing with time of day, weather, strong smells, emotions - no history of allergies - + cough, paroxysmal, occasional white sputum production, when produces sputum feels better  Ancillary information including prior medications, full medical/surgical/family/social histoies, and PFTs (when available) are listed below and have been reviewed.   ROS + symptoms in bold Fevers, chills, weight loss Nausea, vomiting, diarrhea Shortness of breath, wheezing, cough Chest pain, palpitations, lower ext edema   Objective:   Vitals:   04/27/19 1023  BP: 122/70  Pulse:  71  Temp: (!) 97.3 F (36.3 C)  TempSrc: Temporal  SpO2: 98%  Weight: 140 lb (63.5 kg)  Height: 5\' 4"  (1.626 m)   98% on RA BMI Readings from Last 3 Encounters:  04/27/19 24.03 kg/m  03/18/19 24.03 kg/m  04/20/18 23.79 kg/m   Wt Readings from Last 3 Encounters:  04/27/19 140 lb (63.5 kg)  03/18/19 140 lb (63.5 kg)  04/20/18 138 lb 9.6 oz (62.9 kg)    GEN: 86 year old woman in no acute distress HEENT: trachea midline, mucus membranes dry and irritated CV: Regular rate and rhythm, extremities are warm PULM: clear, no wheezing, no stridor GI: Soft, +BS EXT: no edema, + arthritic changes NEURO: Moves all 4 extremities   Ancillary Information    Past Medical History:  Diagnosis Date  . A-fib ([place])   . Anxiety   . Cancer ([place])    breast  . COPD (chronic obstructive pulmonary disease) ([place])   . Depression   . Hypertension   . TIA (transient ischemic attack)    remote     Family History  Problem Relation Age of Onset  . Heart disease Brother   . Heart disease Sister   . Breast cancer Sister   . Leukemia Brother   . Breast cancer Sister   . Breast cancer Other        one deceased, one living  . Colon cancer Neg Hx      Past Surgical History:  Procedure Laterality Date    . ABDOMINAL HYSTERECTOMY    . APPENDECTOMY    . BACK SURGERY     total of five surgeries  . BREAST BIOPSY Left    benign  . BREAST CAPSULECTOMY WITH IMPLANT EXCHANGE Right 05/27/2016   Procedure: REMOVAL AND REPLACEMENT OF RIGHT BREAST IMPLANT AND BREAST CAPSULECTOMY;  Surgeon: Cristine Polio, MD;  Location: [facility] [place];  Service: Plastics;  Laterality: Right;  . CARDIAC CATHETERIZATION    . CHOLECYSTECTOMY    . COLONOSCOPY  08/2014   Dr. Britta Mccreedy: Small sessile polyp at the cecum, removed, tubular adenoma  . ESOPHAGOGASTRODUODENOSCOPY  06/2013   Dr. Britta Mccreedy: Hiatal hernia, Schatzki ring, nonobstructive.  Marland Kitchen MASTECTOMY     right   . ORIF ANKLE FRACTURE Left 06/01/2014   Procedure: OPEN REDUCTION INTERNAL FIXATION (ORIF) LEFT ANKLE FRACTURE;  Surgeon: Marybelle Killings, MD;  Location: [place];  Service: Orthopedics;  Laterality: Left;  . ROTATOR CUFF REPAIR     left  . TOTAL HIP ARTHROPLASTY     right  . TOTAL KNEE ARTHROPLASTY     bilateral    Social History   Socioeconomic History  . Marital status: Widowed    Spouse name: Not on file  . Number of children: Not on file  . Years of education: Not on file  . Highest education level: Not on file  Occupational History  . Not on file  Tobacco Use  . Smoking status: Never Smoker  . Smokeless tobacco: Never Used  Substance and Sexual Activity  . Alcohol use: No    Alcohol/week: 0.0 standard drinks  . Drug use: No  . Sexual activity: Not on file  Other Topics Concern  . Not on file  Social History Narrative  . Not on file   Social Determinants of Health   Financial Resource Strain:   . Difficulty of Paying Living Expenses: Not on file  Food Insecurity:   . Worried About Charity fundraiser in the Last Year:  Not on file  . Ran Out of Food in the Last Year: Not on file  Transportation Needs:   . Lack of Transportation (Medical): Not on file  . Lack of Transportation (Non-Medical): Not on file  Physical Activity:    . Days of Exercise per Week: Not on file  . Minutes of Exercise per Session: Not on file  Stress:   . Feeling of Stress : Not on file  Social Connections:   . Frequency of Communication with Friends and Family: Not on file  . Frequency of Social Gatherings with Friends and Family: Not on file  . Attends Religious Services: Not on file  . Active Member of Clubs or Organizations: Not on file  . Attends Archivist Meetings: Not on file  . Marital Status: Not on file  Intimate Partner Violence:   . Fear of Current or Ex-Partner: Not on file  . Emotionally Abused: Not on file  . Physically Abused: Not on file  . Sexually Abused: Not on file     Allergies  Allergen Reactions  . Morphine And Related Nausea And Vomiting  . Sulfa Antibiotics Nausea And Vomiting  . Penicillins Rash     CBC    Component Value Date/Time   WBC 4.9 01/27/2017 1405   RBC 4.13 01/27/2017 1405   HGB 12.5 01/27/2017 1405   HCT 40.4 01/27/2017 1405   PLT 226 01/27/2017 1405   MCV 97.8 01/27/2017 1405   MCH 30.3 01/27/2017 1405   MCHC 30.9 01/27/2017 1405   RDW 13.5 01/27/2017 1405   LYMPHSABS 1.3 02/23/2015 1058   MONOABS 0.5 02/23/2015 1058   EOSABS 0.1 02/23/2015 1058   BASOSABS 0.0 02/23/2015 1058    Pulmonary Functions Testing Results: PFT Results Latest Ref Rng & Units 11/30/2014 11/09/2013  FVC-Pre L 1.39 1.62  FVC-Predicted Pre % 52 60  FVC-Post L 1.32 1.69  FVC-Predicted Post % 49 62  Pre FEV1/FVC % % 64 68  Post FEV1/FCV % % 65 63  FEV1-Pre L 0.89 1.10  FEV1-Predicted Pre % 44 54  FEV1-Post L 0.86 1.07  DLCO UNC% % 22 -  DLCO COR %Predicted % 83 -  TLC L 4.42 5.17  TLC % Predicted % 87 102  RV % Predicted % 130 161    Outpatient Medications Prior to Visit  Medication Sig Dispense Refill  . albuterol (PROVENTIL HFA;VENTOLIN HFA) 108 (90 Base) MCG/ACT inhaler Inhale 2 puffs into the lungs every 6 (six) hours as needed for wheezing or shortness of breath.    Marland Kitchen atorvastatin  (LIPITOR) 20 MG tablet Take 1 tablet by mouth daily.    . calcium carbonate (OS-CAL - DOSED IN MG OF ELEMENTAL CALCIUM) 1250 (500 Ca) MG tablet Take 1 tablet by mouth daily with breakfast.    . clonazePAM (KLONOPIN) 1 MG tablet Take 1 tablet (1 mg total) by mouth at bedtime. 30 tablet 0  . ELIQUIS 5 MG TABS tablet Take 1 tablet by mouth twice daily 60 tablet 6  . esomeprazole (NEXIUM) 20 MG capsule Take 20 mg by mouth daily at 12 noon.    . hydrochlorothiazide (HYDRODIURIL) 12.5 MG tablet Take 1 tablet by mouth daily.    Marland Kitchen losartan (COZAAR) 50 MG tablet Take 1 tablet by mouth daily.    . metoprolol tartrate (LOPRESSOR) 25 MG tablet Take 1 tablet (25 mg total) by mouth 2 (two) times daily. 180 tablet 3  . Multiple Vitamin (MULTIVITAMIN) tablet Take 1 tablet by mouth daily.    Marland Kitchen  nitroGLYCERIN (NITROSTAT) 0.4 MG SL tablet Place 1 tablet (0.4 mg total) under the tongue every 5 (five) minutes as needed for chest pain. 25 tablet 3  . OXYGEN Inhale 2 L into the lungs at bedtime.    . triamcinolone cream (KENALOG) 0.1 % Apply 1 application topically as needed.    . venlafaxine (EFFEXOR) 75 MG tablet Take 150 mg by mouth every morning. & 75 mg in the evening    . vitamin B-12 (CYANOCOBALAMIN) 100 MCG tablet Take 200 mcg by mouth daily.      No facility-administered medications prior to visit.

## 2019-04-27 ENCOUNTER — Ambulatory Visit: Payer: Medicare HMO | Admitting: Internal Medicine

## 2019-04-27 ENCOUNTER — Other Ambulatory Visit: Payer: Self-pay

## 2019-04-27 ENCOUNTER — Encounter: Payer: Self-pay | Admitting: Internal Medicine

## 2019-04-27 DIAGNOSIS — M79671 Pain in right foot: Secondary | ICD-10-CM | POA: Diagnosis not present

## 2019-04-27 DIAGNOSIS — J849 Interstitial pulmonary disease, unspecified: Secondary | ICD-10-CM

## 2019-04-27 DIAGNOSIS — M25579 Pain in unspecified ankle and joints of unspecified foot: Secondary | ICD-10-CM | POA: Diagnosis not present

## 2019-04-27 DIAGNOSIS — M79672 Pain in left foot: Secondary | ICD-10-CM | POA: Diagnosis not present

## 2019-04-27 DIAGNOSIS — R0602 Shortness of breath: Secondary | ICD-10-CM

## 2019-04-27 MED ORDER — FLUTTER DEVI: 1.0000 | Freq: Four times a day (QID) | 0 refills | Status: DC

## 2019-04-27 MED ORDER — FLUTICASONE FUROATE-VILANTEROL 100-25 MCG/INH IN AEPB: 1.0000 | INHALATION_SPRAY | Freq: Every day | RESPIRATORY_TRACT | 11 refills | Status: DC

## 2019-04-27 MED ORDER — BREO ELLIPTA 100-25 MCG/INH IN AEPB: 1.0000 | INHALATION_SPRAY | Freq: Every day | RESPIRATORY_TRACT | 0 refills | Status: DC

## 2019-04-27 NOTE — Patient Instructions (Signed)
-  Flutter Valve  -Breo trial- only fill this if (A) it helps and (B) it is affordable, if it is not affordable ask pharmacist best alternative.  -CT scan of chest  -Come back in 1 month and we will review everything and come up with final plan

## 2019-04-28 ENCOUNTER — Telehealth: Payer: Medicare HMO | Admitting: Cardiovascular Disease

## 2019-04-29 ENCOUNTER — Encounter: Payer: Self-pay | Admitting: Cardiovascular Disease

## 2019-04-29 ENCOUNTER — Telehealth (INDEPENDENT_AMBULATORY_CARE_PROVIDER_SITE_OTHER): Payer: Medicare HMO | Admitting: Cardiovascular Disease

## 2019-04-29 VITALS — BP 146/68 | HR 74 | Ht 64.0 in | Wt 140.0 lb

## 2019-04-29 DIAGNOSIS — J383 Other diseases of vocal cords: Secondary | ICD-10-CM

## 2019-04-29 DIAGNOSIS — I4819 Other persistent atrial fibrillation: Secondary | ICD-10-CM | POA: Diagnosis not present

## 2019-04-29 DIAGNOSIS — R0609 Other forms of dyspnea: Secondary | ICD-10-CM

## 2019-04-29 DIAGNOSIS — E782 Mixed hyperlipidemia: Secondary | ICD-10-CM

## 2019-04-29 DIAGNOSIS — I10 Essential (primary) hypertension: Secondary | ICD-10-CM

## 2019-04-29 NOTE — Patient Instructions (Signed)

## 2019-04-29 NOTE — Progress Notes (Signed)
Virtual Visit via Telephone Note   This visit type was conducted due to national recommendations for restrictions regarding the COVID-19 Pandemic (e.g. social distancing) in an effort to limit this patient's exposure and mitigate transmission in our community.  Due to her co-morbid illnesses, this patient is at least at moderate risk for complications without adequate follow up.  This format is felt to be most appropriate for this patient at this time.  The patient did not have access to video technology/had technical difficulties with video requiring transitioning to audio format only (telephone).  All issues noted in this document were discussed and addressed.  No physical exam could be performed with this format.  Please refer to the patient's chart for her  consent to telehealth for [facility].   Date:  04/29/2019   ID:  Amanda Palmer, DOB 1933/07/08, MRN [id number]  Patient Location: Home Provider Location: Office  PCP:  Celene Squibb, MD  Cardiologist:  Kate Sable, MD  Electrophysiologist:  None   Evaluation Performed:  Follow-Up Visit  Chief Complaint: Persistent atrial fibrillation  History of Present Illness:    Amanda Palmer is a 86 y.o. female with persistent atrial fibrillation. She was diagnosed with vocal cord dysfunction in June 2017 at [facility].   She has some exertional dyspnea alleviated with inhalers. She now follows with pulmonary.  She has been scheduled for a high-resolution chest CT on 05/07/2019.  She denies chest pain, leg swelling, dizziness, and palpitations.     Past Medical History:  Diagnosis Date  . A-fib ([place])   . Anxiety   . Cancer ([place])    breast  . COPD (chronic obstructive pulmonary disease) ([place])   . Depression   . Hypertension   . TIA (transient ischemic attack)    remote   Past Surgical History:  Procedure Laterality Date  . ABDOMINAL HYSTERECTOMY    . APPENDECTOMY    . BACK SURGERY     total of five surgeries   . BREAST BIOPSY Left    benign  . BREAST CAPSULECTOMY WITH IMPLANT EXCHANGE Right 05/27/2016   Procedure: REMOVAL AND REPLACEMENT OF RIGHT BREAST IMPLANT AND BREAST CAPSULECTOMY;  Surgeon: Cristine Polio, MD;  Location: [place];  Service: Plastics;  Laterality: Right;  . CARDIAC CATHETERIZATION    . CHOLECYSTECTOMY    . COLONOSCOPY  08/2014   Dr. Britta Mccreedy: Small sessile polyp at the cecum, removed, tubular adenoma  . ESOPHAGOGASTRODUODENOSCOPY  06/2013   Dr. Britta Mccreedy: Hiatal hernia, Schatzki ring, nonobstructive.  Marland Kitchen MASTECTOMY     right   . ORIF ANKLE FRACTURE Left 06/01/2014   Procedure: OPEN REDUCTION INTERNAL FIXATION (ORIF) LEFT ANKLE FRACTURE;  Surgeon: Marybelle Killings, MD;  Location: [place];  Service: Orthopedics;  Laterality: Left;  . ROTATOR CUFF REPAIR     left  . TOTAL HIP ARTHROPLASTY     right  . TOTAL KNEE ARTHROPLASTY     bilateral     Current Meds  Medication Sig  . albuterol (PROVENTIL HFA;VENTOLIN HFA) 108 (90 Base) MCG/ACT inhaler Inhale 2 puffs into the lungs every 6 (six) hours as needed for wheezing or shortness of breath.  Marland Kitchen atorvastatin (LIPITOR) 20 MG tablet Take 1 tablet by mouth daily.  . calcium carbonate (OS-CAL - DOSED IN MG OF ELEMENTAL CALCIUM) 1250 (500 Ca) MG tablet Take 1 tablet by mouth daily with breakfast.  . clonazePAM (KLONOPIN) 1 MG tablet Take 1 tablet (1 mg total) by mouth at bedtime.  Marland Kitchen  ELIQUIS 5 MG TABS tablet Take 1 tablet by mouth twice daily  . esomeprazole (NEXIUM) 20 MG capsule Take 20 mg by mouth daily at 12 noon.  . fluticasone furoate-vilanterol (BREO ELLIPTA) 100-25 MCG/INH AEPB Inhale 1 puff into the lungs daily.  . hydrochlorothiazide (HYDRODIURIL) 12.5 MG tablet Take 1 tablet by mouth daily.  Marland Kitchen losartan (COZAAR) 50 MG tablet Take 1 tablet by mouth daily.  . metoprolol tartrate (LOPRESSOR) 25 MG tablet Take 1 tablet (25 mg total) by mouth 2 (two) times daily.  . Multiple Vitamin (MULTIVITAMIN) tablet Take 1 tablet by  mouth daily.  . nitroGLYCERIN (NITROSTAT) 0.4 MG SL tablet Place 1 tablet (0.4 mg total) under the tongue every 5 (five) minutes as needed for chest pain.  . OXYGEN Inhale 2 L into the lungs at bedtime.  Marland Kitchen Respiratory Therapy Supplies (FLUTTER) DEVI 1 Device by Does not apply route in the morning, at noon, in the evening, and at bedtime.  . triamcinolone cream (KENALOG) 0.1 % Apply 1 application topically as needed.  . venlafaxine (EFFEXOR) 75 MG tablet Take 150 mg by mouth every morning. & 75 mg in the evening  . vitamin B-12 (CYANOCOBALAMIN) 100 MCG tablet Take 200 mcg by mouth daily.      Allergies:   Morphine and related, Sulfa antibiotics, and Penicillins   Social History   Tobacco Use  . Smoking status: Never Smoker  . Smokeless tobacco: Never Used  Substance Use Topics  . Alcohol use: No    Alcohol/week: 0.0 standard drinks  . Drug use: No     Family Hx: The patient's family history includes Breast cancer in her sister, sister and another family member; Heart disease in her brother and sister; Leukemia in her brother. There is no history of Colon cancer.  ROS:   Please see the history of present illness.     All other systems reviewed and are negative.   Prior CV studies:   The following studies were reviewed today:  Echocardiogram on 06/01/14 demonstrated normal left ventricular systolic function, EF 0000000, normal regional wall motion, grade 1 diastolic dysfunction, and mild left atrial dilatation.  Labs/Other Tests and Data Reviewed:    EKG:  No ECG reviewed.  Recent Labs: No results found for requested labs within last 8760 hours.   Recent Lipid Panel No results found for: CHOL, TRIG, HDL, CHOLHDL, LDLCALC, LDLDIRECT  Wt Readings from Last 3 Encounters:  04/29/19 140 lb (63.5 kg)  04/27/19 140 lb (63.5 kg)  03/18/19 140 lb (63.5 kg)     Objective:    Vital Signs:  BP (!) 146/68   Pulse 74   Ht 5\' 4"  (1.626 m)   Wt 140 lb (63.5 kg)   BMI 24.03 kg/m     VITAL SIGNS:  reviewed  ASSESSMENT & PLAN:    1. Persistent atrial fibrillation:  Symptomatically stable with increase of metoprolol to 25 mg twice daily.  CHADSVASC 6 thus high thromboembolic risk. Continue apixaban 5 mg bid.  2. Essential HTN:BP is mildly elevated. No changes for now.  3. Hyperlipidemia: Continue Lipitor 20 mg.  4. Shortness of breath: She appears to have vocal cord dysfunction and symptoms are alleviated with inhalers. She was evaluated for this at [facility] in June 2017. She now follows with pulmonary. She has been scheduled for a high-resolution chest CT on 05/07/2019.    COVID-19 Education: The signs and symptoms of COVID-19 were discussed with the patient and how to seek care for testing (  follow up with PCP or arrange E-visit).  The importance of social distancing was discussed today.  Time:   Today, I have spent 10 minutes with the patient with telehealth technology discussing the above problems.     Medication Adjustments/Labs and Tests Ordered: Current medicines are reviewed at length with the patient today.  Concerns regarding medicines are outlined above.   Tests Ordered: No orders of the defined types were placed in this encounter.   Medication Changes: No orders of the defined types were placed in this encounter.   Follow Up:  Virtual Visit  1 yr  Signed, Kate Sable, MD  04/29/2019 2:13 PM    [place]

## 2019-05-07 ENCOUNTER — Ambulatory Visit (HOSPITAL_COMMUNITY)
Admission: RE | Admit: 2019-05-07 | Discharge: 2019-05-07 | Disposition: A | Discharge status: Home / Self Care | Payer: Medicare HMO | Source: Ambulatory Visit | Attending: Internal Medicine | Admitting: Internal Medicine

## 2019-05-07 ENCOUNTER — Other Ambulatory Visit: Payer: Self-pay

## 2019-05-07 DIAGNOSIS — J849 Interstitial pulmonary disease, unspecified: Secondary | ICD-10-CM

## 2019-05-07 DIAGNOSIS — R0602 Shortness of breath: Secondary | ICD-10-CM | POA: Insufficient documentation

## 2019-05-25 ENCOUNTER — Encounter: Payer: Self-pay | Admitting: Primary Care

## 2019-05-25 ENCOUNTER — Other Ambulatory Visit: Payer: Self-pay

## 2019-05-25 ENCOUNTER — Ambulatory Visit: Payer: Medicare HMO | Admitting: Primary Care

## 2019-05-25 VITALS — BP 116/74 | HR 62 | Temp 98.7°F | Ht 64.0 in | Wt 142.0 lb

## 2019-05-25 DIAGNOSIS — R05 Cough: Secondary | ICD-10-CM | POA: Diagnosis not present

## 2019-05-25 DIAGNOSIS — R058 Other specified cough: Secondary | ICD-10-CM

## 2019-05-25 DIAGNOSIS — J42 Unspecified chronic bronchitis: Secondary | ICD-10-CM

## 2019-05-25 DIAGNOSIS — R059 Cough, unspecified: Secondary | ICD-10-CM

## 2019-05-25 MED ORDER — FLUTICASONE FUROATE-VILANTEROL 100-25 MCG/INH IN AEPB: 1.0000 | INHALATION_SPRAY | Freq: Every day | RESPIRATORY_TRACT | 5 refills | Status: DC

## 2019-05-25 NOTE — Patient Instructions (Signed)
Orders: - Sputum culture x 3  Recommendations: - Continue Breo 1 puff daily (rinse mouth after use) - Use albuterol rescue inhaler two puffs every 4-6 hours as needed for breakthrough shortness of breath or wheezing  - Use flutter valve three times a day - Take mucinex twice daily for chest congestion   Follow-up:  - 6-8 weeks with Dr. Tamala Julian or Eustaquio Maize NP

## 2019-05-25 NOTE — Progress Notes (Signed)
@Patient  ID: Amanda Palmer, female    DOB: 23-Sep-1933, 86 y.o.   MRN: [id number]  Chief Complaint  Patient presents with  . Follow-up    1 month f/u after CT scan. Breathing has been stable.     Referring provider: Celene Squibb, MD  HPI: 86 year old female, never smoked. PMH significant for COPD, upper airway cough syndrome, HTN, syncope, arrhythmia, GERD, ARF. Former patient of Dr. Melvyn Novas back in 2017, new patient of Dr. Tamala Julian seen for consult on 04/27/19. Symptoms felt to be related to UACS VS vcd, however, she does have abnormal PFTs. SLP exercises for VCD were not helpful. Recommending CT chest to evaluate for ILD and trial of BREO with flutter valve.   05/25/2019 Patient presents today for 1 month follow-up. Breathing is better, reports noticeable benefit from trial of Breo. She still experiences moderate dyspnea with activity. She has a daily cough with mucus production. Color is between yellow and white. She is using her flutter valve three times a day. She uses her albuterol rescue inhaler 1-2 times a week.  She experiences wheezing in the morning which improves after her inhaler. She has intermittent sweats. No fever, chills, chest tightness, hemoptysis.   Significant testing reviewed: 05/07/19 HRCT - Mild scarring of bilateral lung bases, No evidence of fibrotic ILD. Numerous small pulmonary nodules, many clustered in RML and LLL consistent with atypical infection particularly atypical mycobacterium.   11/30/14 PFTs- FVC 1.32 (49%), FEV1 0.86 (43%), ratio 65, DLCOunc 22% Severe obstructive airway disease, probable restriction, severe diffusion defect   Allergies  Allergen Reactions  . Morphine And Related Nausea And Vomiting  . Sulfa Antibiotics Nausea And Vomiting  . Penicillins Rash    Immunization History  Administered Date(s) Administered  . Influenza Split 01/19/2007, 01/25/2009, 01/25/2010, 11/14/2010, 12/10/2014  . Influenza, High Dose Seasonal PF 11/23/2018  .  Influenza-Unspecified 01/09/2018  . Pneumococcal Conjugate-13 12/10/2014  . Pneumococcal Polysaccharide-23 01/24/2004  . Tdap 11/23/2010    Past Medical History:  Diagnosis Date  . A-fib ([place])   . Anxiety   . Cancer ([place])    breast  . COPD (chronic obstructive pulmonary disease) ([place])   . Depression   . Hypertension   . TIA (transient ischemic attack)    remote    Tobacco History: Social History   Tobacco Use  Smoking Status Never Smoker  Smokeless Tobacco Never Used   Counseling given: Not Answered   Outpatient Medications Prior to Visit  Medication Sig Dispense Refill  . albuterol (PROVENTIL HFA;VENTOLIN HFA) 108 (90 Base) MCG/ACT inhaler Inhale 2 puffs into the lungs every 6 (six) hours as needed for wheezing or shortness of breath.    Marland Kitchen atorvastatin (LIPITOR) 20 MG tablet Take 1 tablet by mouth daily.    . calcium carbonate (OS-CAL - DOSED IN MG OF ELEMENTAL CALCIUM) 1250 (500 Ca) MG tablet Take 1 tablet by mouth daily with breakfast.    . clonazePAM (KLONOPIN) 1 MG tablet Take 1 tablet (1 mg total) by mouth at bedtime. 30 tablet 0  . ELIQUIS 5 MG TABS tablet Take 1 tablet by mouth twice daily 60 tablet 6  . esomeprazole (NEXIUM) 20 MG capsule Take 20 mg by mouth daily at 12 noon.    . hydrochlorothiazide (HYDRODIURIL) 12.5 MG tablet Take 1 tablet by mouth daily.    Marland Kitchen losartan (COZAAR) 50 MG tablet Take 1 tablet by mouth daily.    . metoprolol tartrate (LOPRESSOR) 25 MG tablet Take 1 tablet (25  mg total) by mouth 2 (two) times daily. 180 tablet 3  . Multiple Vitamin (MULTIVITAMIN) tablet Take 1 tablet by mouth daily.    . nitroGLYCERIN (NITROSTAT) 0.4 MG SL tablet Place 1 tablet (0.4 mg total) under the tongue every 5 (five) minutes as needed for chest pain. 25 tablet 3  . OXYGEN Inhale 2 L into the lungs at bedtime.    Marland Kitchen Respiratory Therapy Supplies (FLUTTER) DEVI 1 Device by Does not apply route in the morning, at noon, in the evening, and at bedtime. 1 each 0  .  triamcinolone cream (KENALOG) 0.1 % Apply 1 application topically as needed.    . venlafaxine (EFFEXOR) 75 MG tablet Take 150 mg by mouth every morning. & 75 mg in the evening    . vitamin B-12 (CYANOCOBALAMIN) 100 MCG tablet Take 200 mcg by mouth daily.     . fluticasone furoate-vilanterol (BREO ELLIPTA) 100-25 MCG/INH AEPB Inhale 1 puff into the lungs daily. 28 each 0   No facility-administered medications prior to visit.   Review of Systems  Review of Systems  Constitutional: Negative.   Respiratory: Positive for cough and shortness of breath. Negative for chest tightness and wheezing.   Cardiovascular: Negative.    Physical Exam  BP 116/74 (BP Location: Left Arm, Patient Position: Sitting, Cuff Size: Normal)   Pulse 62   Temp 98.7 F (37.1 C) (Temporal)   Ht 5\' 4"  (1.626 m)   Wt 142 lb (64.4 kg)   SpO2 98% Comment: on RA  BMI 24.37 kg/m  Physical Exam Constitutional:      Appearance: Normal appearance.  HENT:     Mouth/Throat:     Mouth: Mucous membranes are moist.     Pharynx: Oropharynx is clear.  Neck:     Comments: Trace edema LLE (baseline) Cardiovascular:     Rate and Rhythm: Normal rate and regular rhythm.  Pulmonary:     Effort: Pulmonary effort is normal.     Breath sounds: Rales present. No wheezing or rhonchi.     Comments: Faint rales bases; no wheezing or respiratory distress Musculoskeletal:     Comments: Amb with rolling walker  Skin:    General: Skin is warm and dry.  Neurological:     General: No focal deficit present.     Mental Status: She is alert and oriented to person, place, and time. Mental status is at baseline.  Psychiatric:        Mood and Affect: Mood normal.        Behavior: Behavior normal.        Thought Content: Thought content normal.        Judgment: Judgment normal.      Lab Results:  CBC    Component Value Date/Time   WBC 4.9 01/27/2017 1405   RBC 4.13 01/27/2017 1405   HGB 12.5 01/27/2017 1405   HCT 40.4  01/27/2017 1405   PLT 226 01/27/2017 1405   MCV 97.8 01/27/2017 1405   MCH 30.3 01/27/2017 1405   MCHC 30.9 01/27/2017 1405   RDW 13.5 01/27/2017 1405   LYMPHSABS 1.3 02/23/2015 1058   MONOABS 0.5 02/23/2015 1058   EOSABS 0.1 02/23/2015 1058   BASOSABS 0.0 02/23/2015 1058    BMET    Component Value Date/Time   NA 137 01/27/2017 1405   K 4.0 01/27/2017 1405   CL 103 01/27/2017 1405   CO2 28 01/27/2017 1405   GLUCOSE 113 (H) 01/27/2017 1405   BUN 16 01/27/2017 1405  CREATININE 0.75 01/27/2017 1405   CALCIUM 9.6 01/27/2017 1405   GFRNONAA >60 01/27/2017 1405   GFRAA >60 01/27/2017 1405    BNP    Component Value Date/Time   BNP 67.0 05/21/2014 2332    ProBNP    Component Value Date/Time   PROBNP 120.0 (H) 02/23/2015 1058    Imaging: CT Chest High Resolution  Result Date: 05/07/2019 CLINICAL DATA:  Interstitial lung disease, severely compromised PFTs, abnormal appearance tracheal and radiograph EXAM: CT CHEST WITHOUT CONTRAST TECHNIQUE: Multidetector CT imaging of the chest was performed following the standard protocol without intravenous contrast. High resolution imaging of the lungs, as well as inspiratory and expiratory imaging, was performed. COMPARISON:  Chest radiographs, 12/15/2018 FINDINGS: Cardiovascular: Aortic atherosclerosis. Normal heart size. Three-vessel coronary artery calcifications. No pericardial effusion. Mediastinum/Nodes: No enlarged mediastinal, hilar, or axillary lymph nodes. Small hiatal hernia. Thyroid gland, trachea, and esophagus demonstrate no significant findings. Lungs/Pleura: There is mild bibasilar, bland appearing scarring or atelectasis of the lung bases. There are small consolidations of medial segment right middle lobe and lingula. There are numerous small pulmonary nodules, many of which are clustered in the right middle lobe and left lower lobe. Other more diffusely scattered, measure up to 5 mm (series 6, image 172). Occasional, distinctly  benign calcified nodules. Expiratory phase is limited, and performed in a generally inspiratory phase. No pleural effusion or pneumothorax. Upper Abdomen: No acute abnormality. Musculoskeletal: No chest wall mass or suspicious bone lesions identified. Status post right mastectomy with implant reconstruction IMPRESSION: 1. Mild, bland appearing scarring of the bilateral lung bases. No evidence of fibrotic interstitial lung disease. 2. Numerous small pulmonary nodules, many clustered in the right middle lobe left lower lobe with small consolidations of the medial segment right middle lobe. 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 3. Non-clustered small pulmonary nodules measuring up to 5 mm. No follow-up needed if patient is low-risk (and has no known or suspected primary neoplasm). Non-contrast chest CT can be considered in 12 months if patient is high-risk. This recommendation follows the consensus statement: Guidelines for Management of Incidental Pulmonary Nodules Detected on CT Images: From the Fleischner Society 2017; Radiology 2017; 284:228-243. 4. Coronary artery disease. Aortic Atherosclerosis (ICD10-I70.0). 5. No specific abnormality of the trachea per ordering indication. Electronically Signed   By: Eddie Candle M.D.   On: 05/07/2019 15:09     Assessment & Plan:   COPD (chronic obstructive pulmonary disease) - Stable interval, breathing is better  - Continue Breo 100, 1 puff daily (rinse mouth after use) - Use albuterol rescue inhaler two puffs every 4-6 hours as needed for breakthrough shortness of breath or wheezing   Productive cough - HRCT in February showed mild scarring of bilateral lung bases. No evidence of fibrotic ILD. Numerous small pulmonary nodules, many clustered in RML and LLL consistent with atypical infection particularly atypical mycobacterium.  - Checking sputum culture x 3 - Recommend using flutter valve three times a day - Take  mucinex twice daily for chest congestion  - FU in 8 weeks     Martyn Ehrich, NP 05/28/2019

## 2019-05-28 ENCOUNTER — Encounter: Payer: Self-pay | Admitting: Primary Care

## 2019-05-28 NOTE — Assessment & Plan Note (Signed)
-   HRCT in February showed mild scarring of bilateral lung bases. No evidence of fibrotic ILD. Numerous small pulmonary nodules, many clustered in RML and LLL consistent with atypical infection particularly atypical mycobacterium.  - Checking sputum culture x 3 - Recommend using flutter valve three times a day - Take mucinex twice daily for chest congestion  - FU in 8 weeks

## 2019-05-28 NOTE — Assessment & Plan Note (Addendum)
-   Stable interval, breathing is better  - Continue Breo 100, 1 puff daily (rinse mouth after use) - Use albuterol rescue inhaler two puffs every 4-6 hours as needed for breakthrough shortness of breath or wheezing

## 2019-06-15 ENCOUNTER — Other Ambulatory Visit (HOSPITAL_COMMUNITY)
Admission: RE | Admit: 2019-06-15 | Discharge: 2019-06-15 | Disposition: A | Discharge status: Home / Self Care | Payer: Medicare HMO | Source: Ambulatory Visit | Attending: Primary Care | Admitting: Primary Care

## 2019-06-15 DIAGNOSIS — R05 Cough: Secondary | ICD-10-CM | POA: Diagnosis not present

## 2019-06-15 DIAGNOSIS — E7849 Other hyperlipidemia: Secondary | ICD-10-CM | POA: Diagnosis not present

## 2019-06-15 DIAGNOSIS — F339 Major depressive disorder, recurrent, unspecified: Secondary | ICD-10-CM | POA: Diagnosis not present

## 2019-06-15 DIAGNOSIS — W19XXXA Unspecified fall, initial encounter: Secondary | ICD-10-CM | POA: Diagnosis not present

## 2019-06-15 DIAGNOSIS — I10 Essential (primary) hypertension: Secondary | ICD-10-CM | POA: Diagnosis not present

## 2019-06-15 DIAGNOSIS — G8929 Other chronic pain: Secondary | ICD-10-CM | POA: Diagnosis not present

## 2019-06-15 DIAGNOSIS — K219 Gastro-esophageal reflux disease without esophagitis: Secondary | ICD-10-CM | POA: Diagnosis not present

## 2019-06-17 LAB — CULTURE, RESPIRATORY W GRAM STAIN: Culture: NORMAL

## 2019-06-18 LAB — ACID FAST SMEAR (AFB, MYCOBACTERIA): Acid Fast Smear: NEGATIVE

## 2019-06-22 NOTE — Progress Notes (Signed)
Please let patient know sputum culture has not showed any growth

## 2019-07-05 DIAGNOSIS — E785 Hyperlipidemia, unspecified: Secondary | ICD-10-CM | POA: Diagnosis not present

## 2019-07-05 DIAGNOSIS — G8929 Other chronic pain: Secondary | ICD-10-CM | POA: Diagnosis not present

## 2019-07-05 DIAGNOSIS — D509 Iron deficiency anemia, unspecified: Secondary | ICD-10-CM | POA: Diagnosis not present

## 2019-07-05 DIAGNOSIS — E782 Mixed hyperlipidemia: Secondary | ICD-10-CM | POA: Diagnosis not present

## 2019-07-05 DIAGNOSIS — I10 Essential (primary) hypertension: Secondary | ICD-10-CM | POA: Diagnosis not present

## 2019-07-05 DIAGNOSIS — F39 Unspecified mood [affective] disorder: Secondary | ICD-10-CM | POA: Diagnosis not present

## 2019-07-05 DIAGNOSIS — F339 Major depressive disorder, recurrent, unspecified: Secondary | ICD-10-CM | POA: Diagnosis not present

## 2019-07-05 DIAGNOSIS — Z0000 Encounter for general adult medical examination without abnormal findings: Secondary | ICD-10-CM | POA: Diagnosis not present

## 2019-07-05 DIAGNOSIS — D649 Anemia, unspecified: Secondary | ICD-10-CM | POA: Diagnosis not present

## 2019-07-06 ENCOUNTER — Encounter: Payer: Self-pay | Admitting: Primary Care

## 2019-07-06 ENCOUNTER — Ambulatory Visit (INDEPENDENT_AMBULATORY_CARE_PROVIDER_SITE_OTHER): Payer: Medicare HMO | Admitting: Primary Care

## 2019-07-06 ENCOUNTER — Other Ambulatory Visit: Payer: Self-pay

## 2019-07-06 ENCOUNTER — Other Ambulatory Visit: Payer: Medicare HMO

## 2019-07-06 DIAGNOSIS — J42 Unspecified chronic bronchitis: Secondary | ICD-10-CM

## 2019-07-06 DIAGNOSIS — R9389 Abnormal findings on diagnostic imaging of other specified body structures: Secondary | ICD-10-CM

## 2019-07-06 MED ORDER — TRELEGY ELLIPTA 100-62.5-25 MCG/INH IN AEPB: 1.0000 | INHALATION_SPRAY | Freq: Every day | RESPIRATORY_TRACT | 6 refills | Status: DC

## 2019-07-06 MED ORDER — GUAIFENESIN 100 MG/5ML PO SOLN: 5.0000 mL | ORAL | 0 refills | Status: DC | PRN

## 2019-07-06 MED ORDER — TRELEGY ELLIPTA 100-62.5-25 MCG/INH IN AEPB: 1.0000 | INHALATION_SPRAY | Freq: Every day | RESPIRATORY_TRACT | 0 refills | Status: DC

## 2019-07-06 NOTE — Addendum Note (Signed)
Addended by: Valerie Salts on: 07/06/2019 03:03 PM   Modules accepted: Orders

## 2019-07-06 NOTE — Assessment & Plan Note (Addendum)
-   PFTs in 2016 showed Severe obstructive airway disease, no BD. Hx 20 year second hand smoke.  - Change Breo to Trelegy 100 one puff once daily (rinse mouth after use) - Change Mucinex to guaifenesin 133ml/5ml every 4-6 hours for cough - Advised patient use flutter valve three times a day - Orders: Checking Alpha-1 - FU in 2-3 months with Dr. Tamala Julian

## 2019-07-06 NOTE — Progress Notes (Signed)
@Patient  ID: Amanda Palmer, female    DOB: [birth year], 86 y.o.   MRN: [id number]  Chief Complaint  Patient presents with  . Follow-up    1 month f/u. States her breathing has been ok since her last visit. Still has a cough that is productive.     Referring provider: Celene Squibb, MD  HPI: 86 year old female, never smoked. PMH significant for COPD, upper airway cough syndrome, HTN, syncope, arrhythmia, GERD, ARF. Former patient of Dr. Melvyn Novas back in 2017, new patient of Dr. Tamala Julian seen for consult on 04/27/19. Symptoms felt to be related to UACS VS vcd, however, she does have abnormal PFTs. SLP exercises for VCD were not helpful. Recommending CT chest to evaluate for ILD and trial of BREO with flutter valve. HRCT showed mild scarring lung bases, no evidence of ILD. Numerous small pulmonary nodules many clustered right middle left lobe. 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 Breathing is better, reports noticeable benefit from trial of Breo. She still experiences moderate dyspnea with activity. She has a daily cough with mucus production. Color is between yellow and white. She is using her flutter valve three times a day. She uses her albuterol rescue inhaler 1-2 times a week.  She experiences wheezing in the morning which improves after her inhaler. She has intermittent sweats. No fever, chills, chest tightness, hemoptysis.   07/06/2019 Patient presents today for 6-8 week follow-up. Accompanied by family friend. Feels [employer] is working some. She still has spells of shortness of breath. States that she wheezes a lot. She reports improvement from albuterol rescue inhaler. She has a productive cough with mucus production. She is not taking mucinex because she feels it dried her up. She is using her flutter valve three tims a day. Sputum sample was normal. AFB negative.  Past  exposure/work hx: Lung infection at age of 11  20 year second hand smoke exposure  Worked in [employer] and [place], possible asbestos exposure   Significant testing reviewed: 05/07/19 HRCT - Mild scarring of bilateral lung bases, No evidence of fibrotic ILD. Numerous small pulmonary nodules, many clustered in RML and LLL consistent with atypical infection particularly atypical mycobacterium.   11/30/14 PFTs- FVC 1.32 (49%), FEV1 0.86 (43%), ratio 65, DLCOunc 22% Severe obstructive airway disease, probable restriction, severe diffusion defect  No bronchodilator response   Allergies  Allergen Reactions  . Morphine And Related Nausea And Vomiting  . Sulfa Antibiotics Nausea And Vomiting  . Penicillins Rash    Immunization History  Administered Date(s) Administered  . Influenza Split 01/19/2007, 01/25/2009, 01/25/2010, 11/14/2010, 12/10/2014  . Influenza, High Dose Seasonal PF 11/23/2018  . Influenza-Unspecified 01/09/2018  . Pneumococcal Conjugate-13 12/10/2014  . Pneumococcal Polysaccharide-23 01/24/2004  . Tdap 11/23/2010    Past Medical History:  Diagnosis Date  . A-fib ([place])   . Anxiety   . Cancer ([place])    breast  . COPD (chronic obstructive pulmonary disease) ([place])   . Depression   . Hypertension   . TIA (transient ischemic attack)    remote    Tobacco History: Social History   Tobacco Use  Smoking Status Never Smoker  Smokeless Tobacco Never Used   Counseling given: Not Answered   Outpatient Medications Prior to Visit  Medication Sig Dispense Refill  . albuterol (PROVENTIL HFA;VENTOLIN HFA) 108 (90 Base) MCG/ACT inhaler Inhale 2 puffs into the lungs every 6 (six) hours  as needed for wheezing or shortness of breath.    Marland Kitchen atorvastatin (LIPITOR) 20 MG tablet Take 1 tablet by mouth daily.    . calcium carbonate (OS-CAL - DOSED IN MG OF ELEMENTAL CALCIUM) 1250 (500 Ca) MG tablet Take 1 tablet by mouth daily with breakfast.    . clonazePAM (KLONOPIN) 1  MG tablet Take 1 tablet (1 mg total) by mouth at bedtime. 30 tablet 0  . ELIQUIS 5 MG TABS tablet Take 1 tablet by mouth twice daily 60 tablet 6  . esomeprazole (NEXIUM) 20 MG capsule Take 20 mg by mouth daily at 12 noon.    . hydrochlorothiazide (HYDRODIURIL) 12.5 MG tablet Take 1 tablet by mouth daily.    Marland Kitchen losartan (COZAAR) 50 MG tablet Take 1 tablet by mouth daily.    . metoprolol tartrate (LOPRESSOR) 25 MG tablet Take 1 tablet (25 mg total) by mouth 2 (two) times daily. 180 tablet 3  . Multiple Vitamin (MULTIVITAMIN) tablet Take 1 tablet by mouth daily.    . nitroGLYCERIN (NITROSTAT) 0.4 MG SL tablet Place 1 tablet (0.4 mg total) under the tongue every 5 (five) minutes as needed for chest pain. 25 tablet 3  . OXYGEN Inhale 2 L into the lungs at bedtime.    Marland Kitchen Respiratory Therapy Supplies (FLUTTER) DEVI 1 Device by Does not apply route in the morning, at noon, in the evening, and at bedtime. 1 each 0  . triamcinolone cream (KENALOG) 0.1 % Apply 1 application topically as needed.    . venlafaxine (EFFEXOR) 75 MG tablet Take 150 mg by mouth every morning. & 75 mg in the evening    . vitamin B-12 (CYANOCOBALAMIN) 100 MCG tablet Take 200 mcg by mouth daily.     . fluticasone furoate-vilanterol (BREO ELLIPTA) 100-25 MCG/INH AEPB Inhale 1 puff into the lungs daily. 28 each 5   No facility-administered medications prior to visit.   Review of Systems  Review of Systems  Respiratory: Positive for cough, shortness of breath and wheezing.    Physical Exam  BP 124/76 (BP Location: Left Arm, Patient Position: Sitting, Cuff Size: Normal)   Pulse 76   Temp 98.2 F (36.8 C) (Temporal)   Ht 5\' 4"  (1.626 m)   Wt 139 lb (63 kg)   SpO2 96% Comment: on RA  BMI 23.86 kg/m  Physical Exam Constitutional:      Appearance: Normal appearance.  HENT:     Head: Normocephalic and atraumatic.     Mouth/Throat:     Mouth: Mucous membranes are moist.     Pharynx: Oropharynx is clear.  Cardiovascular:      Rate and Rhythm: Normal rate and regular rhythm.  Pulmonary:     Effort: Pulmonary effort is normal.     Breath sounds: No wheezing or rhonchi.  Skin:    General: Skin is warm and dry.  Neurological:     Mental Status: She is alert.  Psychiatric:        Mood and Affect: Mood normal.        Behavior: Behavior normal.        Thought Content: Thought content normal.        Judgment: Judgment normal.      Lab Results:  CBC    Component Value Date/Time   WBC 4.9 01/27/2017 1405   RBC 4.13 01/27/2017 1405   HGB 12.5 01/27/2017 1405   HCT 40.4 01/27/2017 1405   PLT 226 01/27/2017 1405   MCV 97.8 01/27/2017 1405  MCH 30.3 01/27/2017 1405   MCHC 30.9 01/27/2017 1405   RDW 13.5 01/27/2017 1405   LYMPHSABS 1.3 02/23/2015 1058   MONOABS 0.5 02/23/2015 1058   EOSABS 0.1 02/23/2015 1058   BASOSABS 0.0 02/23/2015 1058    BMET    Component Value Date/Time   NA 137 01/27/2017 1405   K 4.0 01/27/2017 1405   CL 103 01/27/2017 1405   CO2 28 01/27/2017 1405   GLUCOSE 113 (H) 01/27/2017 1405   BUN 16 01/27/2017 1405   CREATININE 0.75 01/27/2017 1405   CALCIUM 9.6 01/27/2017 1405   GFRNONAA >60 01/27/2017 1405   GFRAA >60 01/27/2017 1405    BNP    Component Value Date/Time   BNP 67.0 05/21/2014 2332    ProBNP    Component Value Date/Time   PROBNP 120.0 (H) 02/23/2015 1058    Imaging: No results found.   Assessment & Plan:   COPD (chronic obstructive pulmonary disease) - PFTs in 2016 showed Severe obstructive airway disease, no BD. Hx 20 year second hand smoke.  - Change Breo to Trelegy 100 one puff once daily (rinse mouth after use) - Change Mucinex to guaifenesin 164ml/5ml every 4-6 hours for cough - Advised patient use flutter valve three times a day - Orders: Checking Alpha-1 - FU in 2-3 months with Dr. Tamala Julian   Abnormal CT of the chest - Hx possible asbestosis exposure and worked in [employer]. HRCT February 2020 showed Mild scarring of bilateral lung  bases, No evidence of fibrotic ILD. Numerous small pulmonary nodules, many clustered in RML (largest 60mm) and LLL consistent with atypical infection particularly atypical mycobacterium.  - Sputum culture negative for AFB and showed no growth  - Continue flutter valve and guaifenesin 100mg /16ml q4 hours - Consider bronchoscopy if patient continues to have frequent exacerbations - Consider repeat HRCT in 1 year to monitor nodules (February 2022)   Martyn Ehrich, NP 07/06/2019

## 2019-07-06 NOTE — Patient Instructions (Addendum)
Stop Breo Change to Trelegy 100- take one puff once daily (rinse mouth after use)  Stop Muciex tablet   Start Robitussin 48ml every 4-6 hours for cough   Continue using flutter valve three times a day   Follow-up: Next available with Dr. Tamala Julian 2-3 months     Chronic Obstructive Pulmonary Disease Chronic obstructive pulmonary disease (COPD) is a long-term (chronic) lung problem. When you have COPD, it is hard for air to get in and out of your lungs. Usually the condition gets worse over time, and your lungs will never return to normal. There are things you can do to keep yourself as healthy as possible.  Your doctor may treat your condition with: ? Medicines. ? Oxygen. ? Lung surgery.  Your doctor may also recommend: ? Rehabilitation. This includes steps to make your body work better. It may involve a team of specialists. ? Quitting smoking, if you smoke. ? Exercise and changes to your diet. ? Comfort measures (palliative care). Follow these instructions at home: Medicines  Take over-the-counter and prescription medicines only as told by your doctor.  Talk to your doctor before taking any cough or allergy medicines. You may need to avoid medicines that cause your lungs to be dry. Lifestyle  If you smoke, stop. Smoking makes the problem worse. If you need help quitting, ask your doctor.  Avoid being around things that make your breathing worse. This may include smoke, chemicals, and fumes.  Stay active, but remember to rest as well.  Learn and use tips on how to relax.  Make sure you get enough sleep. Most adults need at least 7 hours of sleep every night.  Eat healthy foods. Eat smaller meals more often. Rest before meals. Controlled breathing Learn and use tips on how to control your breathing as told by your doctor. Try:  Breathing in (inhaling) through your nose for 1 second. Then, pucker your lips and breath out (exhale) through your lips for 2 seconds.  Putting  one hand on your belly (abdomen). Breathe in slowly through your nose for 1 second. Your hand on your belly should move out. Pucker your lips and breathe out slowly through your lips. Your hand on your belly should move in as you breathe out.  Controlled coughing Learn and use controlled coughing to clear mucus from your lungs. Follow these steps: 1. Lean your head a little forward. 2. Breathe in deeply. 3. Try to hold your breath for 3 seconds. 4. Keep your mouth slightly open while coughing 2 times. 5. Spit any mucus out into a tissue. 6. Rest and do the steps again 1 or 2 times as needed. General instructions  Make sure you get all the shots (vaccines) that your doctor recommends. Ask your doctor about a flu shot and a pneumonia shot.  Use oxygen therapy and pulmonary rehabilitation if told by your doctor. If you need home oxygen therapy, ask your doctor if you should buy a tool to measure your oxygen level (oximeter).  Make a COPD action plan with your doctor. This helps you to know what to do if you feel worse than usual.  Manage any other conditions you have as told by your doctor.  Avoid going outside when it is very hot, cold, or humid.  Avoid people who have a sickness you can catch (contagious).  Keep all follow-up visits as told by your doctor. This is important. Contact a doctor if:  You cough up more mucus than usual.  There is  a change in the color or thickness of the mucus.  It is harder to breathe than usual.  Your breathing is faster than usual.  You have trouble sleeping.  You need to use your medicines more often than usual.  You have trouble doing your normal activities such as getting dressed or walking around the house. Get help right away if:  You have shortness of breath while resting.  You have shortness of breath that stops you from: ? Being able to talk. ? Doing normal activities.  Your chest hurts for longer than 5 minutes.  Your skin  color is more blue than usual.  Your pulse oximeter shows that you have low oxygen for longer than 5 minutes.  You have a fever.  You feel too tired to breathe normally. Summary  Chronic obstructive pulmonary disease (COPD) is a long-term lung problem.  The way your lungs work will never return to normal. Usually the condition gets worse over time. There are things you can do to keep yourself as healthy as possible.  Take over-the-counter and prescription medicines only as told by your doctor.  If you smoke, stop. Smoking makes the problem worse. This information is not intended to replace advice given to you by your health care provider. Make sure you discuss any questions you have with your health care provider. Document Revised: 02/07/2017 Document Reviewed: 04/01/2016 Elsevier Patient Education  2020 [employer].

## 2019-07-06 NOTE — Assessment & Plan Note (Addendum)
-   Hx possible asbestosis exposure and worked in [employer]. HRCT February 2020 showed Mild scarring of bilateral lung bases, No evidence of fibrotic ILD. Numerous small pulmonary nodules, many clustered in RML (largest 5mm) and LLL consistent with atypical infection particularly atypical mycobacterium.  - Sputum culture negative for AFB and showed no growth  - Continue flutter valve and guaifenesin 100mg /34ml q4 hours - Consider bronchoscopy if patient continues to have frequent exacerbations - Consider repeat HRCT in 1 year to monitor nodules (February 2022)

## 2019-07-08 DIAGNOSIS — K219 Gastro-esophageal reflux disease without esophagitis: Secondary | ICD-10-CM | POA: Diagnosis not present

## 2019-07-08 DIAGNOSIS — E785 Hyperlipidemia, unspecified: Secondary | ICD-10-CM | POA: Diagnosis not present

## 2019-07-08 DIAGNOSIS — J449 Chronic obstructive pulmonary disease, unspecified: Secondary | ICD-10-CM | POA: Diagnosis not present

## 2019-07-08 DIAGNOSIS — M818 Other osteoporosis without current pathological fracture: Secondary | ICD-10-CM | POA: Diagnosis not present

## 2019-07-08 DIAGNOSIS — I482 Chronic atrial fibrillation, unspecified: Secondary | ICD-10-CM | POA: Diagnosis not present

## 2019-07-08 DIAGNOSIS — R0602 Shortness of breath: Secondary | ICD-10-CM | POA: Diagnosis not present

## 2019-07-08 DIAGNOSIS — Z7409 Other reduced mobility: Secondary | ICD-10-CM | POA: Diagnosis not present

## 2019-07-08 DIAGNOSIS — F419 Anxiety disorder, unspecified: Secondary | ICD-10-CM | POA: Diagnosis not present

## 2019-07-08 DIAGNOSIS — I10 Essential (primary) hypertension: Secondary | ICD-10-CM | POA: Diagnosis not present

## 2019-07-14 LAB — FUNGUS CULTURE WITH STAIN

## 2019-07-14 LAB — FUNGAL ORGANISM REFLEX

## 2019-07-14 LAB — FUNGUS CULTURE RESULT

## 2019-07-15 DIAGNOSIS — D509 Iron deficiency anemia, unspecified: Secondary | ICD-10-CM | POA: Diagnosis not present

## 2019-07-15 DIAGNOSIS — I10 Essential (primary) hypertension: Secondary | ICD-10-CM | POA: Diagnosis not present

## 2019-07-15 DIAGNOSIS — I4891 Unspecified atrial fibrillation: Secondary | ICD-10-CM | POA: Diagnosis not present

## 2019-07-15 DIAGNOSIS — M4800 Spinal stenosis, site unspecified: Secondary | ICD-10-CM | POA: Diagnosis not present

## 2019-07-15 DIAGNOSIS — K219 Gastro-esophageal reflux disease without esophagitis: Secondary | ICD-10-CM | POA: Diagnosis not present

## 2019-07-15 DIAGNOSIS — G8929 Other chronic pain: Secondary | ICD-10-CM | POA: Diagnosis not present

## 2019-07-15 DIAGNOSIS — E782 Mixed hyperlipidemia: Secondary | ICD-10-CM | POA: Diagnosis not present

## 2019-07-15 LAB — ALPHA-1 ANTITRYPSIN PHENOTYPE: A-1 Antitrypsin, Ser: 137 mg/dL (ref 83–199)

## 2019-07-30 LAB — ACID FAST CULTURE WITH REFLEXED SENSITIVITIES (MYCOBACTERIA): Acid Fast Culture: NEGATIVE

## 2019-08-07 DIAGNOSIS — J449 Chronic obstructive pulmonary disease, unspecified: Secondary | ICD-10-CM | POA: Diagnosis not present

## 2019-08-17 ENCOUNTER — Encounter: Payer: Self-pay | Admitting: Gastroenterology

## 2019-08-18 ENCOUNTER — Other Ambulatory Visit (HOSPITAL_COMMUNITY): Payer: Self-pay | Admitting: Internal Medicine

## 2019-08-18 DIAGNOSIS — Z1231 Encounter for screening mammogram for malignant neoplasm of breast: Secondary | ICD-10-CM

## 2019-08-20 DIAGNOSIS — I10 Essential (primary) hypertension: Secondary | ICD-10-CM | POA: Diagnosis not present

## 2019-08-20 DIAGNOSIS — F339 Major depressive disorder, recurrent, unspecified: Secondary | ICD-10-CM | POA: Diagnosis not present

## 2019-08-20 DIAGNOSIS — K219 Gastro-esophageal reflux disease without esophagitis: Secondary | ICD-10-CM | POA: Diagnosis not present

## 2019-08-20 DIAGNOSIS — E7849 Other hyperlipidemia: Secondary | ICD-10-CM | POA: Diagnosis not present

## 2019-08-20 DIAGNOSIS — G8929 Other chronic pain: Secondary | ICD-10-CM | POA: Diagnosis not present

## 2019-08-20 DIAGNOSIS — W19XXXA Unspecified fall, initial encounter: Secondary | ICD-10-CM | POA: Diagnosis not present

## 2019-08-30 ENCOUNTER — Telehealth: Payer: Self-pay | Admitting: Primary Care

## 2019-08-30 NOTE — Telephone Encounter (Signed)
Do you know if she tried applying for patient assistance?  OR Does she have medicare part D plan? May be able to change to bevespi.   Cc: pharamacy for benefits investigation

## 2019-08-30 NOTE — Telephone Encounter (Signed)
Patient called, her Trelegy is expensive and she is requesting an alternative.

## 2019-08-31 NOTE — Telephone Encounter (Signed)
Please help patient apply for patient assistance for Trelegy. If she does not qualify we can change to [facility]

## 2019-08-31 NOTE — Telephone Encounter (Signed)
Would pt qualify for patient assistance, she has Medicare.

## 2019-08-31 NOTE — Telephone Encounter (Signed)
LMTCB x1 for pt to discuss this.

## 2019-08-31 NOTE — Telephone Encounter (Signed)
It would depend on the program requirements and her income. [employer] requires patients to have spent at least $600 for the year on prescriptions.

## 2019-08-31 NOTE — Telephone Encounter (Signed)
Ran test claims at [facility]- Patient has Medicare D plan and is in the coverage gap.  Trelegy- 1 month supply is $94.87  Breztri- 1 month supply is $93.06  Bevespi- 1 month supply is $62.31  Patient should apply for patient assistance.

## 2019-09-01 NOTE — Telephone Encounter (Signed)
LMTCB x2 for pt 

## 2019-09-02 ENCOUNTER — Telehealth: Payer: Self-pay | Admitting: Primary Care

## 2019-09-02 NOTE — Telephone Encounter (Signed)
LMTCB x3 for pt. We have attempted to contact pt several times with no success or call back from pt. Per triage protocol, message will be closed.   

## 2019-09-02 NOTE — Telephone Encounter (Addendum)
Last encounter discussed patient assistance for Trelegy.  Pt stated she would try to get patient assistance for Trelegy. I sent forms in the mail for her.    Beatriz Chancellor, CPhT to Randa Spike, CMA  11:16 AM Note   It would depend on the program requirements and her income. [employer] requires patients to have spent at least $600 for the year on prescriptions.

## 2019-09-03 MED ORDER — TRELEGY ELLIPTA 100-62.5-25 MCG/INH IN AEPB: 1.0000 | INHALATION_SPRAY | Freq: Every day | RESPIRATORY_TRACT | 5 refills | Status: DC

## 2019-09-03 NOTE — Telephone Encounter (Signed)
Patient is checking on the RX for Trelegy. Patient phone number is [phone].

## 2019-09-03 NOTE — Telephone Encounter (Signed)
Spoke with pt. She would like for a Trelegy prescription sent to [facility]. Rx has been sent in. Nothing further was needed.

## 2019-09-07 DIAGNOSIS — J449 Chronic obstructive pulmonary disease, unspecified: Secondary | ICD-10-CM | POA: Diagnosis not present

## 2019-09-08 ENCOUNTER — Ambulatory Visit (HOSPITAL_COMMUNITY)
Admission: RE | Admit: 2019-09-08 | Discharge: 2019-09-08 | Disposition: A | Discharge status: Home / Self Care | Payer: Medicare HMO | Source: Ambulatory Visit | Attending: Internal Medicine | Admitting: Internal Medicine

## 2019-09-08 ENCOUNTER — Other Ambulatory Visit: Payer: Self-pay

## 2019-09-08 DIAGNOSIS — Z1231 Encounter for screening mammogram for malignant neoplasm of breast: Secondary | ICD-10-CM | POA: Insufficient documentation

## 2019-09-16 ENCOUNTER — Telehealth: Payer: Self-pay | Admitting: Cardiovascular Disease

## 2019-09-16 ENCOUNTER — Telehealth: Payer: Self-pay | Admitting: Primary Care

## 2019-09-16 NOTE — Telephone Encounter (Signed)
New message    Patient is applying for financial assistance for her medications and they need a copy of all the medications that Dr Bronson Ing prescribes for her.  She also needs an itemized statement of everything that she has paid this year mailed to her as well.    I am forwarding this to nursing and to billing, to get patient assistance for both.

## 2019-09-16 NOTE — Telephone Encounter (Signed)
LMTCB x 1 

## 2019-09-16 NOTE — Telephone Encounter (Signed)
Detailed voice message left - medication list mailed to patient.

## 2019-09-17 NOTE — Telephone Encounter (Signed)
ATC pt, received a busy signal x2. °Will try back. °

## 2019-09-17 NOTE — Telephone Encounter (Signed)
ATC pt, no answer. Left message for pt to call back.  

## 2019-09-17 NOTE — Telephone Encounter (Signed)
Pt returning missed call [phone]

## 2019-09-20 NOTE — Telephone Encounter (Signed)
LMTCB x3 for pt. We have attempted to contact pt several times with no success or call back from pt. Per triage protocol, message will be closed.   

## 2019-09-27 ENCOUNTER — Telehealth: Payer: Self-pay | Admitting: Primary Care

## 2019-09-27 NOTE — Telephone Encounter (Signed)
lmtcb for pt.  

## 2019-09-29 NOTE — Telephone Encounter (Signed)
LMTCB x2  

## 2019-09-30 ENCOUNTER — Telehealth: Payer: Self-pay | Admitting: Primary Care

## 2019-09-30 NOTE — Telephone Encounter (Signed)
Attempted to call pt but unable to reach. Left message for her to return call. Due to multiple attempts trying to reach pt without being able to do so, per triage protocol encounter will be closed.

## 2019-09-30 NOTE — Telephone Encounter (Signed)
ATC x 1 

## 2019-10-04 NOTE — Telephone Encounter (Signed)
LMTCB x2 for pt 

## 2019-10-05 NOTE — Telephone Encounter (Signed)
Called and spoke with pt. Pt stated she wanted to see one of our MD's at the [place] office due to it being more convenient for her due to her living in [place]. Pt has been scheduled for an appt with Dr. Halford Chessman 9/16 at the [place] office. Nothing further needed.

## 2019-10-07 DIAGNOSIS — J449 Chronic obstructive pulmonary disease, unspecified: Secondary | ICD-10-CM | POA: Diagnosis not present

## 2019-10-08 DIAGNOSIS — F339 Major depressive disorder, recurrent, unspecified: Secondary | ICD-10-CM | POA: Diagnosis not present

## 2019-10-08 DIAGNOSIS — G8929 Other chronic pain: Secondary | ICD-10-CM | POA: Diagnosis not present

## 2019-10-08 DIAGNOSIS — K219 Gastro-esophageal reflux disease without esophagitis: Secondary | ICD-10-CM | POA: Diagnosis not present

## 2019-10-08 DIAGNOSIS — E7849 Other hyperlipidemia: Secondary | ICD-10-CM | POA: Diagnosis not present

## 2019-10-08 DIAGNOSIS — I10 Essential (primary) hypertension: Secondary | ICD-10-CM | POA: Diagnosis not present

## 2019-10-08 DIAGNOSIS — W19XXXA Unspecified fall, initial encounter: Secondary | ICD-10-CM | POA: Diagnosis not present

## 2019-10-26 DIAGNOSIS — D509 Iron deficiency anemia, unspecified: Secondary | ICD-10-CM | POA: Diagnosis not present

## 2019-10-26 DIAGNOSIS — Z9882 Breast implant status: Secondary | ICD-10-CM | POA: Diagnosis not present

## 2019-10-26 DIAGNOSIS — Z0000 Encounter for general adult medical examination without abnormal findings: Secondary | ICD-10-CM | POA: Diagnosis not present

## 2019-10-26 DIAGNOSIS — D649 Anemia, unspecified: Secondary | ICD-10-CM | POA: Diagnosis not present

## 2019-10-26 DIAGNOSIS — K219 Gastro-esophageal reflux disease without esophagitis: Secondary | ICD-10-CM | POA: Diagnosis not present

## 2019-10-26 DIAGNOSIS — N6459 Other signs and symptoms in breast: Secondary | ICD-10-CM | POA: Diagnosis not present

## 2019-10-26 DIAGNOSIS — M4800 Spinal stenosis, site unspecified: Secondary | ICD-10-CM | POA: Diagnosis not present

## 2019-10-26 DIAGNOSIS — R7301 Impaired fasting glucose: Secondary | ICD-10-CM | POA: Diagnosis not present

## 2019-10-26 DIAGNOSIS — E782 Mixed hyperlipidemia: Secondary | ICD-10-CM | POA: Diagnosis not present

## 2019-10-26 DIAGNOSIS — I10 Essential (primary) hypertension: Secondary | ICD-10-CM | POA: Diagnosis not present

## 2019-10-26 DIAGNOSIS — Z9181 History of falling: Secondary | ICD-10-CM | POA: Diagnosis not present

## 2019-10-26 DIAGNOSIS — I4891 Unspecified atrial fibrillation: Secondary | ICD-10-CM | POA: Diagnosis not present

## 2019-10-26 DIAGNOSIS — K449 Diaphragmatic hernia without obstruction or gangrene: Secondary | ICD-10-CM | POA: Diagnosis not present

## 2019-10-26 DIAGNOSIS — W19XXXA Unspecified fall, initial encounter: Secondary | ICD-10-CM | POA: Diagnosis not present

## 2019-10-26 DIAGNOSIS — G8929 Other chronic pain: Secondary | ICD-10-CM | POA: Diagnosis not present

## 2019-10-28 ENCOUNTER — Telehealth: Payer: Self-pay | Admitting: Primary Care

## 2019-10-28 DIAGNOSIS — J449 Chronic obstructive pulmonary disease, unspecified: Secondary | ICD-10-CM | POA: Diagnosis not present

## 2019-10-28 DIAGNOSIS — I482 Chronic atrial fibrillation, unspecified: Secondary | ICD-10-CM | POA: Diagnosis not present

## 2019-10-28 DIAGNOSIS — I10 Essential (primary) hypertension: Secondary | ICD-10-CM | POA: Diagnosis not present

## 2019-10-28 DIAGNOSIS — K219 Gastro-esophageal reflux disease without esophagitis: Secondary | ICD-10-CM | POA: Diagnosis not present

## 2019-10-28 DIAGNOSIS — Z7409 Other reduced mobility: Secondary | ICD-10-CM | POA: Diagnosis not present

## 2019-10-28 DIAGNOSIS — Z0001 Encounter for general adult medical examination with abnormal findings: Secondary | ICD-10-CM | POA: Diagnosis not present

## 2019-10-28 DIAGNOSIS — F419 Anxiety disorder, unspecified: Secondary | ICD-10-CM | POA: Diagnosis not present

## 2019-10-28 DIAGNOSIS — R0602 Shortness of breath: Secondary | ICD-10-CM | POA: Diagnosis not present

## 2019-10-28 DIAGNOSIS — M818 Other osteoporosis without current pathological fracture: Secondary | ICD-10-CM | POA: Diagnosis not present

## 2019-10-28 DIAGNOSIS — E785 Hyperlipidemia, unspecified: Secondary | ICD-10-CM | POA: Diagnosis not present

## 2019-10-28 NOTE — Telephone Encounter (Signed)
ATC patient unable to reach LM to call back office (x1)   Dr. Halford Chessman is in [place] tomorrow he can print and sign from there if patient calls back

## 2019-11-04 NOTE — Telephone Encounter (Signed)
lmtcb for pt.  

## 2019-11-07 DIAGNOSIS — J449 Chronic obstructive pulmonary disease, unspecified: Secondary | ICD-10-CM | POA: Diagnosis not present

## 2019-11-09 NOTE — Telephone Encounter (Signed)
LMTCB x 3. Will sign off on message, per triage protocol, as we have attempted to contact pt without success. Pt has an appt on 9/16 with Dr. Halford Chessman.

## 2019-11-10 DIAGNOSIS — E782 Mixed hyperlipidemia: Secondary | ICD-10-CM | POA: Diagnosis not present

## 2019-11-10 DIAGNOSIS — I4891 Unspecified atrial fibrillation: Secondary | ICD-10-CM | POA: Diagnosis not present

## 2019-11-10 DIAGNOSIS — G8929 Other chronic pain: Secondary | ICD-10-CM | POA: Diagnosis not present

## 2019-11-10 DIAGNOSIS — M4800 Spinal stenosis, site unspecified: Secondary | ICD-10-CM | POA: Diagnosis not present

## 2019-11-10 DIAGNOSIS — I10 Essential (primary) hypertension: Secondary | ICD-10-CM | POA: Diagnosis not present

## 2019-11-10 DIAGNOSIS — K219 Gastro-esophageal reflux disease without esophagitis: Secondary | ICD-10-CM | POA: Diagnosis not present

## 2019-11-10 DIAGNOSIS — D509 Iron deficiency anemia, unspecified: Secondary | ICD-10-CM | POA: Diagnosis not present

## 2019-11-11 ENCOUNTER — Telehealth: Payer: Self-pay | Admitting: Primary Care

## 2019-11-12 NOTE — Telephone Encounter (Signed)
lmtcb for pt.  

## 2019-11-16 NOTE — Telephone Encounter (Signed)
ATC x 3. Line would ring then disconnect, no VM option. WCB.

## 2019-11-18 NOTE — Telephone Encounter (Signed)
lmtcb for pt.  

## 2019-11-22 ENCOUNTER — Telehealth: Payer: Self-pay | Admitting: Primary Care

## 2019-11-22 NOTE — Telephone Encounter (Signed)
Called and spoke with patient about [place] paperwork. She seems very confused about the whole process and states that someone called and asked if she was interested in patient assistance with her medication. Informed her that she has an appointment on Thursday 9/16 with Dr. Halford Chessman in our [place] office and that we can take care of the patient assistance paperwork when she comes in. She expressed understanding. I will let Anderson Malta know to have the papers ready for her. Nothing further needed at this time.

## 2019-11-25 ENCOUNTER — Other Ambulatory Visit: Payer: Self-pay

## 2019-11-25 ENCOUNTER — Telehealth: Payer: Self-pay

## 2019-11-25 ENCOUNTER — Ambulatory Visit: Payer: Medicare HMO | Admitting: Pulmonary Disease

## 2019-11-25 ENCOUNTER — Encounter: Payer: Self-pay | Admitting: Pulmonary Disease

## 2019-11-25 VITALS — BP 158/82 | HR 83 | Temp 97.3°F | Ht 64.0 in | Wt 145.6 lb

## 2019-11-25 DIAGNOSIS — J439 Emphysema, unspecified: Secondary | ICD-10-CM

## 2019-11-25 DIAGNOSIS — G4736 Sleep related hypoventilation in conditions classified elsewhere: Secondary | ICD-10-CM

## 2019-11-25 DIAGNOSIS — J449 Chronic obstructive pulmonary disease, unspecified: Secondary | ICD-10-CM | POA: Diagnosis not present

## 2019-11-25 MED ORDER — TRELEGY ELLIPTA 100-62.5-25 MCG/INH IN AEPB: 1.0000 | INHALATION_SPRAY | Freq: Every day | RESPIRATORY_TRACT | 0 refills | Status: DC

## 2019-11-25 MED ORDER — DOXYCYCLINE HYCLATE 100 MG PO TABS: 100.0000 mg | ORAL_TABLET | Freq: Two times a day (BID) | ORAL | 0 refills | Status: DC

## 2019-11-25 MED ORDER — PREDNISONE 10 MG PO TABS: ORAL_TABLET | ORAL | 0 refills | Status: AC

## 2019-11-25 NOTE — Progress Notes (Signed)
[facility]  Chief Complaint  Patient presents with  . Follow-up    shortness of breath with exertion, productive cough with yellow and white in color    Constitutional:  BP (!) 158/82 (BP Location: Left Arm, Cuff Size: Normal)   Pulse 83   Temp (!) 97.3 F (36.3 C) (Other (Comment)) Comment (Src): wrist  Ht 5\' 4"  (1.626 m)   Wt 145 lb 9.6 oz (66 kg)   SpO2 99% Comment: Room air  BMI 24.99 kg/m   Past Medical History:  HTN, Sycope, GERD, A fib, Anxiety, Breast cancer, Depression, HTN, TIA, HLD  Past Surgical History:  Her  has a past surgical history that includes Cardiac catheterization; Total knee arthroplasty; Total hip arthroplasty; Rotator cuff repair; Mastectomy; ORIF ankle fracture (Left, 06/01/2014); Breast capsulectomy with implant exchange (Right, 05/27/2016); Esophagogastroduodenoscopy (06/2013); Colonoscopy (08/2014); Back surgery; Abdominal hysterectomy; Cholecystectomy; Appendectomy; and Breast biopsy (Left).  Brief Summary:  Amanda Palmer is a 86 y.o. female with COPD.      Subjective:   She continues to have cough with chest congestion.  Gets winded with activity.  Has frequent episodes of wheezing.  Brings up clear to yellow sputum.  Using 2 liters oxygen at night.  Sputum AFB and fungal cultures from 06/15/19 were negative.  She maintained her SpO2 > 90% while walking in office today.  She couldn't complete full 600 feet because she felt tired and short of breath.  Physical Exam:   Appearance - well kempt   ENMT - no sinus tenderness, no oral exudate, no LAN, Mallampati 3 airway, no stridor  Respiratory - decreased BS b/l, scattered rhonchi, faint wheeze b/l  CV - s1s2 regular rate and rhythm, no murmurs  Ext - no clubbing, no edema  Skin - no rashes  Psych - normal mood and affect   Pulmonary testing:   PFT 11/09/13 >> FEV1 1.10 (54%), FEV1% 68, TLC 5.17 (102%)  PFT 11/30/14 >> FEV1 0.89 (44%), FEV1%  64, TLC 4.42 (87%), RV 3.10 (130%), DLCO 22%  RAST 02/23/15 >> negative, IgE 4  A1AT 07/06/19 >> 137, MM  Chest Imaging:   HRCT chest 05/07/19 >> atherosclerosis, scarring at lung bases, numerous small nodules clustered in RML and LLL  Sleep Tests:   PSG 11/21/15 >> AHI 0.8, SpO2 low 90%  Cardiac Tests:    Social History:  She  reports that she has never smoked. She has never used smokeless tobacco. She reports that she does not drink alcohol and does not use drugs.  Family History:  Her family history includes Breast cancer in her sister, sister and another family member; Heart disease in her brother and sister; Leukemia in her brother.     Assessment/Plan:   COPD mixed type. - she has an exacerbation - will give course of prednisone and doxycycline - continue trelegy and prn albuterol - reviewed her previous PFTS and imaging studies with her  Nocturnal hypoxemia 2nd to COPD. - continue 2 liters oxygen at night  Persistent atrial fibrillation, HTN, HLD. - followed by [facility]   Time Spent Involved in Patient Care on Day of Examination:  42 minutes  Follow up:  Patient Instructions  Prednisone 10 mg pill >> 2 pills daily for 2 days, then 1 pill daily for 2 days  Doxycycline 100 mg pill twice daily for 5 days  Continue using Trelegy one puff daily, and rinse your mouth after each use  Medication List:   Allergies as of 11/25/2019      Reactions   Morphine And Related Nausea And Vomiting   Sulfa Antibiotics Nausea And Vomiting   Penicillins Rash      Medication List       Accurate as of November 25, 2019 12:22 PM. If you have any questions, ask your nurse or doctor.        STOP taking these medications   nitroGLYCERIN 0.4 MG SL tablet Commonly known as: NITROSTAT Stopped by: Chesley Mires, MD     TAKE these medications   albuterol 108 (90 Base) MCG/ACT inhaler Commonly known as: VENTOLIN HFA Inhale 2 puffs into the lungs every 6 (six)  hours as needed for wheezing or shortness of breath.   atorvastatin 20 MG tablet Commonly known as: LIPITOR Take 1 tablet by mouth daily.   calcium carbonate 1250 (500 Ca) MG tablet Commonly known as: OS-CAL - dosed in mg of elemental calcium Take 1 tablet by mouth daily with breakfast.   clonazePAM 1 MG tablet Commonly known as: KLONOPIN Take 1 tablet (1 mg total) by mouth at bedtime.   doxycycline 100 MG tablet Commonly known as: VIBRA-TABS Take 1 tablet (100 mg total) by mouth 2 (two) times daily. Started by: Chesley Mires, MD   Eliquis 5 MG Tabs tablet Generic drug: apixaban Take 1 tablet by mouth twice daily   esomeprazole 20 MG capsule Commonly known as: NEXIUM Take 20 mg by mouth daily at 12 noon.   Flutter Devi 1 Device by Does not apply route in the morning, at noon, in the evening, and at bedtime.   guaiFENesin 100 MG/5ML Soln Commonly known as: ROBITUSSIN Take 5 mLs (100 mg total) by mouth every 4 (four) hours as needed for cough or to loosen phlegm.   hydrochlorothiazide 12.5 MG tablet Commonly known as: HYDRODIURIL Take 1 tablet by mouth daily.   losartan 50 MG tablet Commonly known as: COZAAR Take 1 tablet by mouth daily.   metoprolol tartrate 25 MG tablet Commonly known as: LOPRESSOR Take 1 tablet (25 mg total) by mouth 2 (two) times daily.   multivitamin tablet Take 1 tablet by mouth daily.   OXYGEN Inhale 2 L into the lungs at bedtime.   predniSONE 10 MG tablet Commonly known as: DELTASONE Take 2 tablets (20 mg total) by mouth daily with breakfast for 2 days, THEN 1 tablet (10 mg total) daily with breakfast for 2 days. Start taking on: November 25, 2019 Started by: Chesley Mires, MD   Trelegy Ellipta 100-62.5-25 MCG/INH Aepb Generic drug: Fluticasone-Umeclidin-Vilant Inhale 1 puff into the lungs daily. What changed: Another medication with the same name was removed. Continue taking this medication, and follow the directions you see  here. Changed by: Chesley Mires, MD   triamcinolone cream 0.1 % Commonly known as: KENALOG Apply 1 application topically as needed.   venlafaxine 75 MG tablet Commonly known as: EFFEXOR Take 150 mg by mouth every morning. & 75 mg in the evening   vitamin B-12 100 MCG tablet Commonly known as: CYANOCOBALAMIN Take 200 mcg by mouth daily.       Signature:  Chesley Mires, MD [place] Pager - [phone] 11/25/2019, 12:22 PM

## 2019-11-25 NOTE — Patient Instructions (Addendum)
Prednisone 10 mg pill >> 2 pills daily for 2 days, then 1 pill daily for 2 days  Doxycycline 100 mg pill twice daily for 5 days  Continue using Trelegy one puff daily, and rinse your mouth after each use  Albuterol every 4 to 6 hours as needed for cough, wheeze, chest congestion, or shortness of breath  Follow up in 4 weeks

## 2019-11-25 NOTE — Telephone Encounter (Signed)
Writer assisted patient with completing Amanda Palmer paperwork and had Dr Halford Chessman sign paperwork along with a printed script for Trelegy 141mcg. Completed paperwork and signed script sent by fax to [facility] patient assistance program.

## 2019-12-08 DIAGNOSIS — J449 Chronic obstructive pulmonary disease, unspecified: Secondary | ICD-10-CM | POA: Diagnosis not present

## 2019-12-09 ENCOUNTER — Ambulatory Visit: Payer: Medicare HMO

## 2019-12-09 ENCOUNTER — Telehealth: Payer: Self-pay | Admitting: Pulmonary Disease

## 2019-12-09 DIAGNOSIS — J449 Chronic obstructive pulmonary disease, unspecified: Secondary | ICD-10-CM

## 2019-12-09 NOTE — Telephone Encounter (Signed)
Primary Pulmonologist: Sood Last office visit and with whom: 11/25/19 with Halford Chessman What do we see them for (pulmonary problems): COPD mixed type Last OV assessment/plan:  Assessment/Plan:   COPD mixed type. - she has an exacerbation - will give course of prednisone and doxycycline - continue trelegy and prn albuterol - reviewed her previous PFTS and imaging studies with her  Nocturnal hypoxemia 2nd to COPD. - continue 2 liters oxygen at night  Persistent atrial fibrillation, HTN, HLD. - followed by [facility]   Time Spent Involved in Patient Care on Day of Examination:  42 minutes     Follow up:   Patient Instructions  Prednisone 10 mg pill >> 2 pills daily for 2 days, then 1 pill daily for 2 days  Doxycycline 100 mg pill twice daily for 5 days  Continue using Trelegy one puff daily, and rinse your mouth after each use    Was appointment offered to patient (explain)?  Pt wants recommendations   Reason for call: Called and spoke with pt who stated she began to have problems with SOB x1 week. Pt states she has had to use her rescue inhaler at least twice daily which she said has helped with her symptoms.  Pt states she is taking her Trelegy inhaler every day as prescribed.  Pt said that she is on O2 at night but states due to her breathing she has been having to use her O2 during the day. Pt has been doing 2L O2 during the day as well as at night and has been having to do this x1 week.  Pt states that she has been wheezing a lot, has been coughing and has been getting up clear phlegm.  Pt states she finished the doxycycline abx last Tuesday 9/21. Pt stated symptoms worsened about 3 days after finishing abx. Pt stated she also started having diarrhea 1-2 days after finishing the abx. Pt stated she was also on prednisone and finished that about the same time as the prednisone. Pt stated that the diarrhea has cleared up as she wonders if this could've been coming from  the abx she was on.  Pt denies any complaints of fever but states she has not taken her temp as she does not feel feverish.  Pt is taking Robitussin to help with her cough.  Pt wants to know what could be recommended to help with her symptoms.  Pt does have an appt scheduled with VS 10/15 in [place]. Pt does not want to drive to [place].  Tammy, please advise.    Allergies  Allergen Reactions  . Morphine And Related Nausea And Vomiting  . Sulfa Antibiotics Nausea And Vomiting  . Penicillins Rash    Immunization History  Administered Date(s) Administered  . Influenza Split 01/19/2007, 01/25/2009, 01/25/2010, 11/14/2010, 12/10/2014  . Influenza, High Dose Seasonal PF 11/23/2018  . Influenza-Unspecified 01/09/2018  . Moderna SARS-COVID-2 Vaccination 05/05/2019, 06/02/2019  . Pneumococcal Conjugate-13 12/10/2014  . Pneumococcal Polysaccharide-23 01/24/2004  . Tdap 11/23/2010, 09/05/2011

## 2019-12-09 NOTE — Telephone Encounter (Signed)
Spoke with patient regarding prior message. Patient stated she can only come to the [place] office.Advised patient to keep her appt on 12/24/19 with Dr.Sood. Next available  slot in not until November .Advised patient will put a order in patients chart for a CXR.Pateint was advised if symptoms worsen to seek emergency care.Patient's voice was understanding. Nothing else further needed.

## 2019-12-09 NOTE — Telephone Encounter (Signed)
Sorry to hear she is not better , but full course of abx and steroids  May need further evaluation and possible cxr. If unable to come to [place]  Can check with PCP or see if other [place] MD can see her tomorrow or next week.  Please contact office for sooner follow up if symptoms do not improve or worsen or seek emergency care

## 2019-12-10 ENCOUNTER — Ambulatory Visit: Payer: Medicare HMO

## 2019-12-10 DIAGNOSIS — M4800 Spinal stenosis, site unspecified: Secondary | ICD-10-CM | POA: Diagnosis not present

## 2019-12-10 DIAGNOSIS — E782 Mixed hyperlipidemia: Secondary | ICD-10-CM | POA: Diagnosis not present

## 2019-12-10 DIAGNOSIS — G8929 Other chronic pain: Secondary | ICD-10-CM | POA: Diagnosis not present

## 2019-12-10 DIAGNOSIS — I10 Essential (primary) hypertension: Secondary | ICD-10-CM | POA: Diagnosis not present

## 2019-12-10 DIAGNOSIS — K219 Gastro-esophageal reflux disease without esophagitis: Secondary | ICD-10-CM | POA: Diagnosis not present

## 2019-12-10 DIAGNOSIS — I4891 Unspecified atrial fibrillation: Secondary | ICD-10-CM | POA: Diagnosis not present

## 2019-12-10 DIAGNOSIS — D509 Iron deficiency anemia, unspecified: Secondary | ICD-10-CM | POA: Diagnosis not present

## 2019-12-13 ENCOUNTER — Ambulatory Visit (INDEPENDENT_AMBULATORY_CARE_PROVIDER_SITE_OTHER): Payer: Medicare HMO | Admitting: Acute Care

## 2019-12-13 ENCOUNTER — Other Ambulatory Visit: Payer: Self-pay

## 2019-12-13 ENCOUNTER — Ambulatory Visit (INDEPENDENT_AMBULATORY_CARE_PROVIDER_SITE_OTHER): Payer: Medicare HMO

## 2019-12-13 ENCOUNTER — Encounter: Payer: Self-pay | Admitting: Acute Care

## 2019-12-13 VITALS — BP 110/68 | HR 85 | Temp 97.4°F | Wt 146.8 lb

## 2019-12-13 DIAGNOSIS — R918 Other nonspecific abnormal finding of lung field: Secondary | ICD-10-CM | POA: Diagnosis not present

## 2019-12-13 DIAGNOSIS — Z23 Encounter for immunization: Secondary | ICD-10-CM

## 2019-12-13 DIAGNOSIS — J449 Chronic obstructive pulmonary disease, unspecified: Secondary | ICD-10-CM

## 2019-12-13 DIAGNOSIS — R0609 Other forms of dyspnea: Secondary | ICD-10-CM

## 2019-12-13 DIAGNOSIS — R0600 Dyspnea, unspecified: Secondary | ICD-10-CM

## 2019-12-13 NOTE — Addendum Note (Signed)
Addended by: Doroteo Glassman D on: 12/13/2019 02:20 PM   Modules accepted: Orders

## 2019-12-13 NOTE — Patient Instructions (Addendum)
It is good to see you today. We will do a CXR today. We will call you with the results. We will order a 2 D Echo We will refer you to Dr. Harl Bowie( Cardiology) [facility] >> urgent request We will order a 6 minute walk. Continue using flutter valve 2-3 times daily  Continue using the albuterol for breakthrough shortness of breath or wheezing. We will give you a spacer for use with your inhaler.  Follow up with Dr. Halford Chessman 12/24/2019.  Please contact office for sooner follow up if symptoms do not improve or worsen or seek emergency care

## 2019-12-13 NOTE — Progress Notes (Signed)
Reviewed and agree with assessment/plan.   Chesley Mires, MD [facility]/Critical Care 12/13/2019, 2:03 PM Pager:  [phone]

## 2019-12-13 NOTE — Progress Notes (Signed)
History of Present Illness Amanda Palmer is a 86 y.o. female never smoker with COPD. She is followed by Dr. Halford Palmer.   Pt. worked in [employer] for about 20 years. Exposed to second-hand smoke (family & work) for about 24 years.There was also asbestos in her work environment   12/13/2019  Pt. Presents for follow up. She saw Dr. Halford Palmer 11/25/2019 for a COPD flare. He treated her with prednisone and Doxycycline. She did complete the Doxycycline and her prednisone taper , but they did give her diarrhea. She states she does not feel these helped her at all.Her friend who is here with her today however, does feel she is a bit better.  She continues to have dyspnea with minimal exertion. This has been worse over the last few months. She does have a history of atrial fibrillation. Last echo was done 2016.  She has clear thick sputum. No fever. She states she Mucinex dries her out. We discussed that this may help her cough up thick secretions. She does use her flutter valve.  She does not use a spacer with her inhaler.   Test Results: Echo 05/2014 Left ventricle: The cavity size was normal. There was mild focal  basal hypertrophy of the septum. Systolic function was normal.  The estimated ejection fraction was in the range of 55% to 60%.  Wall motion was normal; there were no regional wall motion  abnormalities. Doppler parameters are consistent with abnormal  left ventricular relaxation (grade 1 diastolic dysfunction).  - Mitral valve: Calcified annulus. Mildly thickened leaflets .  - Left atrium: The atrium was mildly dilated.  - Pulmonary arteries: Systolic pressure was mildly increased. PA  peak pressure: 42 mm Hg (S).      A-1 Antitrypsin, Ser 83 - 199 mg/dL 137   ALPHA-1-ANTITRYPSIN (AAT) PHENOTYPE  SEE NOTE   Comment: THIS PATIENT'S ALPHA-1-ANTITRYPSIN PHENOTYPE IS PI*MM.       PFT 11/09/13 >> FEV1 1.10 (54%), FEV1% 68, TLC 5.17 (102%)  PFT 11/30/14 >> FEV1 0.89  (44%), FEV1% 64, TLC 4.42 (87%), RV 3.10 (130%), DLCO 22%  RAST 02/23/15 >> negative, IgE 4  A1AT 07/06/19 >> 137, MM   HRCT chest 05/07/19 >> atherosclerosis, scarring at lung bases, numerous small nodules clustered in RML and LLL   PSG 11/21/15 >> AHI 0.8, SpO2 low 90%  CBC Latest Ref Rng & Units 01/27/2017 02/23/2015 06/20/2014  WBC 4.0 - 10.5 K/uL 4.9 7.9 3.3(L)  Hemoglobin 12.0 - 15.0 g/dL 12.5 12.6 8.3(L)  Hematocrit 36 - 46 % 40.4 38.3 26.8(L)  Platelets 150 - 400 K/uL 226 263.0 279    BMP Latest Ref Rng & Units 01/27/2017 02/23/2015 06/09/2014  Glucose 65 - 99 mg/dL 113(H) 85 93  BUN 6 - 20 mg/dL 16 14 17   Creatinine 0.44 - 1.00 mg/dL 0.75 0.92 0.92  Sodium 135 - 145 mmol/L 137 139 141  Potassium 3.5 - 5.1 mmol/L 4.0 4.6 4.1  Chloride 101 - 111 mmol/L 103 101 105  CO2 22 - 32 mmol/L 28 34(H) 31  Calcium 8.9 - 10.3 mg/dL 9.6 10.2 8.9    BNP    Component Value Date/Time   BNP 67.0 05/21/2014 2332    ProBNP    Component Value Date/Time   PROBNP 120.0 (H) 02/23/2015 1058    PFT    Component Value Date/Time   FEV1PRE 0.89 11/30/2014 1053   FEV1POST 0.86 11/30/2014 1053   FVCPRE 1.39 11/30/2014 1053   FVCPOST 1.32 11/30/2014 1053  TLC 4.42 11/30/2014 1053   DLCOUNC 5.56 11/30/2014 1053   PREFEV1FVCRT 64 11/30/2014 1053   PSTFEV1FVCRT 65 11/30/2014 1053    No results found.   Past medical hx Past Medical History:  Diagnosis Date  . A-fib ([place])   . Anxiety   . Cancer ([place])    breast  . COPD (chronic obstructive pulmonary disease) ([place])   . Depression   . Hypertension   . TIA (transient ischemic attack)    remote     Social History   Tobacco Use  . Smoking status: Never Smoker  . Smokeless tobacco: Never Used  Vaping Use  . Vaping Use: Never used  Substance Use Topics  . Alcohol use: No    Alcohol/week: 0.0 standard drinks  . Drug use: No    Amanda Palmer reports that she has never smoked. She has never used smokeless tobacco. She reports  that she does not drink alcohol and does not use drugs.  Tobacco Cessation: Never smoker  Past surgical hx, Family hx, Social hx all reviewed.  Current Outpatient Medications on File Prior to Visit  Medication Sig  . albuterol (PROVENTIL HFA;VENTOLIN HFA) 108 (90 Base) MCG/ACT inhaler Inhale 2 puffs into the lungs every 6 (six) hours as needed for wheezing or shortness of breath.  Marland Kitchen atorvastatin (LIPITOR) 20 MG tablet Take 1 tablet by mouth daily.  . calcium carbonate (OS-CAL - DOSED IN MG OF ELEMENTAL CALCIUM) 1250 (500 Ca) MG tablet Take 1 tablet by mouth daily with breakfast.  . clonazePAM (KLONOPIN) 1 MG tablet Take 1 tablet (1 mg total) by mouth at bedtime.  Marland Kitchen ELIQUIS 5 MG TABS tablet Take 1 tablet by mouth twice daily  . esomeprazole (NEXIUM) 20 MG capsule Take 20 mg by mouth daily at 12 noon.  . Fluticasone-Umeclidin-Vilant (TRELEGY ELLIPTA) 100-62.5-25 MCG/INH AEPB Inhale 1 puff into the lungs daily.  Marland Kitchen guaiFENesin (ROBITUSSIN) 100 MG/5ML SOLN Take 5 mLs (100 mg total) by mouth every 4 (four) hours as needed for cough or to loosen phlegm.  . hydrochlorothiazide (HYDRODIURIL) 12.5 MG tablet Take 1 tablet by mouth daily.  . metoprolol tartrate (LOPRESSOR) 25 MG tablet Take 1 tablet (25 mg total) by mouth 2 (two) times daily.  . Multiple Vitamin (MULTIVITAMIN) tablet Take 1 tablet by mouth daily.  . OXYGEN Inhale 2 L into the lungs at bedtime.  Marland Kitchen Respiratory Therapy Supplies (FLUTTER) DEVI 1 Device by Does not apply route in the morning, at noon, in the evening, and at bedtime.  . triamcinolone cream (KENALOG) 0.1 % Apply 1 application topically as needed.  . venlafaxine (EFFEXOR) 75 MG tablet Take 150 mg by mouth every morning. & 75 mg in the evening  . vitamin B-12 (CYANOCOBALAMIN) 100 MCG tablet Take 200 mcg by mouth daily.   Marland Kitchen doxycycline (VIBRA-TABS) 100 MG tablet Take 1 tablet (100 mg total) by mouth 2 (two) times daily. (Patient not taking: Reported on 12/13/2019)  . losartan  (COZAAR) 50 MG tablet Take 1 tablet by mouth daily.   No current facility-administered medications on file prior to visit.     Allergies  Allergen Reactions  . Morphine And Related Nausea And Vomiting  . Sulfa Antibiotics Nausea And Vomiting  . Penicillins Rash    Review Of Systems:  Constitutional:   No  weight loss, night sweats,  Fevers, chills, fatigue, or  lassitude.  HEENT:   No headaches,  Difficulty swallowing,  Tooth/dental problems, or  Sore throat,  No sneezing, itching, ear ache, nasal congestion, post nasal drip,   CV:  No chest pain,  Orthopnea, PND, swelling in lower extremities, anasarca, dizziness, palpitations, syncope.   GI  No heartburn, indigestion, abdominal pain, nausea, vomiting, diarrhea, change in bowel habits, loss of appetite, bloody stools.   Resp: + shortness of breath with exertion or at rest.  + excess mucus, + productive cough,  No non-productive cough,  No coughing up of blood.  No change in color of mucus.  Occasional  wheezing.  No chest wall deformity  Skin: no rash or lesions.  GU: no dysuria, change in color of urine, no urgency or frequency.  No flank pain, no hematuria   MS:  No joint pain or swelling.  No decreased range of motion.  No back pain.  Psych:  No change in mood or affect. No depression or anxiety.  No memory loss.   Vital Signs BP 110/68   Pulse 85   Temp (!) 97.4 F (36.3 C) (Oral)   Wt 146 lb 12.8 oz (66.6 kg)   SpO2 97%   BMI 25.20 kg/m    Physical Exam:  General- No distress,  A&Ox3, pleasant ENT: No sinus tenderness, TM clear, pale nasal mucosa, no oral exudate,no post nasal drip, no LAN, + HOH Cardiac: S1, S2, regular rate and rhythm, no murmur Chest: No wheeze/ rales/ dullness; no accessory muscle use, no nasal flaring, no sternal retractions Abd.: Soft Non-tender, ND, BS +, Body mass index is 25.2 kg/m. Ext: No clubbing cyanosis, edema Neuro:  Deconditioned at baseline, MAE x 4, A&O x 3,  walks with a cane Skin: No rashes,No lesions warm and dry Psych: normal mood and behavior   Assessment/Plan  Dyspnea with exertion Treated with Doxy and pred taper 11/25/2019 for COPD flare Previous echo shows PAP of 42 mmHg ( 2016) Hx. Of atrial fib on Eliquis Plan We will do a CXR today. We will call you with the results. We will order a 2 D Echo We will refer you to Dr. Harl Bowie( Cardiology) [facility] >> urgent request ( Hx a fib) We will order a 6 minute walk. Continue using flutter valve 2-3 times daily  Continue using the albuterol for breakthrough shortness of breath or wheezing. We will give you a spacer for use with your inhaler.  We will order a CBC to ensure anemia is not part of etiology. Follow up with Dr. Halford Palmer 12/24/2019 as is scheduled.  Please contact office for sooner follow up if symptoms do not improve or worsen or seek emergency care       Magdalen Spatz, NP 12/13/2019  11:18 AM

## 2019-12-15 ENCOUNTER — Telehealth: Payer: Self-pay | Admitting: Pulmonary Disease

## 2019-12-15 NOTE — Telephone Encounter (Signed)
DG Chest 2 View  Result Date: 12/13/2019 CLINICAL DATA:  Chronic obstructive pulmonary disease. EXAM: CHEST - 2 VIEW COMPARISON:  December 15, 2018. FINDINGS: Stable cardiomediastinal silhouette. No pneumothorax or pleural effusion is noted. Hyperexpansion of the lungs is noted. No consolidative process is noted. Bony thorax is unremarkable. IMPRESSION: No active cardiopulmonary disease. Hyperexpansion of the lungs is noted suggesting COPD. Electronically Signed   By: Marijo Conception M.D.   On: 12/13/2019 16:57     Please let her know her CXR shows expected changes from COPD.  She should continue using her inhalers.  If she doesn't feel like trelegy is working effectively, then can try changing her to breztri.

## 2019-12-17 MED ORDER — BREZTRI AEROSPHERE 160-9-4.8 MCG/ACT IN AERO: 2.0000 | INHALATION_SPRAY | Freq: Two times a day (BID) | RESPIRATORY_TRACT | 11 refills | Status: DC

## 2019-12-17 NOTE — Telephone Encounter (Signed)
Patient returning call for chest x-ray results.  [phone].

## 2019-12-17 NOTE — Telephone Encounter (Signed)
Spoke with the pt and notified of recs per VS  She verbalized understanding  She states never did think that the trelegy was effective, and wants to try using breztri  Rx was sent to pharm

## 2019-12-21 ENCOUNTER — Ambulatory Visit (HOSPITAL_COMMUNITY)
Admission: RE | Admit: 2019-12-21 | Discharge: 2019-12-21 | Disposition: A | Discharge status: Home / Self Care | Payer: Medicare HMO | Source: Ambulatory Visit | Attending: Acute Care | Admitting: Acute Care

## 2019-12-21 ENCOUNTER — Other Ambulatory Visit: Payer: Self-pay

## 2019-12-21 DIAGNOSIS — R0609 Other forms of dyspnea: Secondary | ICD-10-CM

## 2019-12-21 DIAGNOSIS — R0600 Dyspnea, unspecified: Secondary | ICD-10-CM | POA: Diagnosis not present

## 2019-12-21 LAB — ECHOCARDIOGRAM COMPLETE
Area-P 1/2: 4.24 cm2
MV M vel: 5.67 m/s
MV Peak grad: 128.6 mmHg
Radius: 0.7 cm
S' Lateral: 2.69 cm

## 2019-12-21 NOTE — Progress Notes (Signed)
*  PRELIMINARY RESULTS* Echocardiogram 2D Echocardiogram has been performed.  Amanda Palmer 12/21/2019, 1:11 PM

## 2019-12-22 ENCOUNTER — Encounter: Payer: Self-pay | Admitting: Acute Care

## 2019-12-24 ENCOUNTER — Encounter: Payer: Self-pay | Admitting: Pulmonary Disease

## 2019-12-24 ENCOUNTER — Ambulatory Visit: Payer: Medicare HMO | Admitting: Pulmonary Disease

## 2019-12-24 ENCOUNTER — Other Ambulatory Visit: Payer: Self-pay

## 2019-12-24 VITALS — BP 136/68 | HR 74 | Temp 97.1°F | Ht 64.0 in | Wt 143.2 lb

## 2019-12-24 DIAGNOSIS — J439 Emphysema, unspecified: Secondary | ICD-10-CM | POA: Diagnosis not present

## 2019-12-24 DIAGNOSIS — G4736 Sleep related hypoventilation in conditions classified elsewhere: Secondary | ICD-10-CM

## 2019-12-24 DIAGNOSIS — J449 Chronic obstructive pulmonary disease, unspecified: Secondary | ICD-10-CM

## 2019-12-24 NOTE — Patient Instructions (Signed)
Follow up in 3 months

## 2019-12-24 NOTE — Progress Notes (Signed)
[facility]  Chief Complaint  Patient presents with  . Follow-up    shortness of breath with exertion    Constitutional:  BP 136/68 (BP Location: Left Arm, Cuff Size: Normal)   Pulse 74   Temp (!) 97.1 F (36.2 C) (Other (Comment)) Comment (Src): wrist  Ht 5\' 4"  (1.626 m)   Wt 143 lb 3.2 oz (65 kg)   SpO2 99% Comment: Room air  BMI 24.58 kg/m   Past Medical History:  HTN, Sycope, GERD, A fib, Anxiety, Breast cancer, Depression, HTN, TIA, HLD  Past Surgical History:  Her  has a past surgical history that includes Cardiac catheterization; Total knee arthroplasty; Total hip arthroplasty; Rotator cuff repair; Mastectomy; ORIF ankle fracture (Left, 06/01/2014); Breast capsulectomy with implant exchange (Right, 05/27/2016); Esophagogastroduodenoscopy (06/2013); Colonoscopy (08/2014); Back surgery; Abdominal hysterectomy; Cholecystectomy; Appendectomy; and Breast biopsy (Left).  Brief Summary:  Amanda Palmer is a 86 y.o. female with COPD.      Subjective:   She say Eric Form on 12/13/19.  CXR from 12/13/19 showed hyperinflation.  She continues to have cough with clear sputum.  She gets winded and wheezy when she goes up stairs.  She recovers after resting about 2 to 5 minutes.  Breathing okay at rest.  She hasn't gone back to church yet - has to walk up about 10 steps.  Not having chest pain, fever, hemoptysis, or leg swelling.  Ambulatory oximetry on room air today: SpO2 stayed above 96% after 3 laps.  Heart rate increased to 120 on third lap.  Echo showed elevation in RVSP similar to 2536, diastolic dysfunction and moderate mitral regurgitation.  Physical Exam:   Appearance - well kempt   ENMT - no sinus tenderness, no oral exudate, no LAN, Mallampati 3 airway, no stridor  Respiratory - decreased breath sounds bilaterally, no wheezing or rales  CV - irregular rate and rhythm, 2/6 murmurs  Ext - no clubbing, no  edema  Skin - no rashes  Psych - normal mood and affect    Pulmonary testing:   PFT 11/09/13 >> FEV1 1.10 (54%), FEV1% 68, TLC 5.17 (102%)  PFT 11/30/14 >> FEV1 0.89 (44%), FEV1% 64, TLC 4.42 (87%), RV 3.10 (130%), DLCO 22%  RAST 02/23/15 >> negative, IgE 4  A1AT 07/06/19 >> 137, MM  Chest Imaging:   HRCT chest 05/07/19 >> atherosclerosis, scarring at lung bases, numerous small nodules clustered in RML and LLL  Sleep Tests:   PSG 11/21/15 >> AHI 0.8, SpO2 low 90%  Cardiac Tests:   Echo 12/21/19 >> EF 60 to 65%, grade 2 DD, RVSP 44.7 mmHg, mod LA dilation, mod MR  Social History:  She  reports that she has never smoked. She has never used smokeless tobacco. She reports that she does not drink alcohol and does not use drugs.  Family History:  Her family history includes Breast cancer in her sister, sister and another family member; Heart disease in her brother and sister; Leukemia in her brother.     Assessment/Plan:   COPD mixed type. - continue trelegy and prn albuterol - high dose influenza vaccine today - discussed natural history of COPD  Nocturnal hypoxemia 2nd to COPD. - continue 2 liters oxygen at night - will proceed with 6 minute walk test to determine if she needs supplemental oxygen during the day  Persistent atrial fibrillation, chronic diastolic CHF, mitral regurgitation, HLD. - followed by Dr. Harl Bowie with [place] [facility]. - this  is likely playing a role in her symptoms of dyspnea with exertion - encouraged her to maintain a regular exercise regimen, and to pace her activities with breaks as needed in between   Time Spent Involved in Patient Care on Day of Examination:  38 minutes  Follow up:  Patient Instructions  Follow up in 3 months   Medication List:   Allergies as of 12/24/2019      Reactions   Morphine And Related Nausea And Vomiting   Sulfa Antibiotics Nausea And Vomiting   Penicillins Rash      Medication List        Accurate as of December 24, 2019 12:44 PM. If you have any questions, ask your nurse or doctor.        STOP taking these medications   Breztri Aerosphere 160-9-4.8 MCG/ACT Aero Generic drug: Budeson-Glycopyrrol-Formoterol Stopped by: Chesley Mires, MD   doxycycline 100 MG tablet Commonly known as: VIBRA-TABS Stopped by: Chesley Mires, MD     TAKE these medications   albuterol 108 (90 Base) MCG/ACT inhaler Commonly known as: VENTOLIN HFA Inhale 2 puffs into the lungs every 6 (six) hours as needed for wheezing or shortness of breath.   atorvastatin 20 MG tablet Commonly known as: LIPITOR Take 1 tablet by mouth daily.   calcium carbonate 1250 (500 Ca) MG tablet Commonly known as: OS-CAL - dosed in mg of elemental calcium Take 1 tablet by mouth daily with breakfast.   clonazePAM 1 MG tablet Commonly known as: KLONOPIN Take 1 tablet (1 mg total) by mouth at bedtime.   Eliquis 5 MG Tabs tablet Generic drug: apixaban Take 1 tablet by mouth twice daily   esomeprazole 20 MG capsule Commonly known as: NEXIUM Take 20 mg by mouth daily at 12 noon.   Flutter Devi 1 Device by Does not apply route in the morning, at noon, in the evening, and at bedtime.   guaiFENesin 100 MG/5ML Soln Commonly known as: ROBITUSSIN Take 5 mLs (100 mg total) by mouth every 4 (four) hours as needed for cough or to loosen phlegm.   hydrochlorothiazide 12.5 MG tablet Commonly known as: HYDRODIURIL Take 1 tablet by mouth daily.   losartan 50 MG tablet Commonly known as: COZAAR Take 1 tablet by mouth daily.   metoprolol tartrate 25 MG tablet Commonly known as: LOPRESSOR Take 1 tablet (25 mg total) by mouth 2 (two) times daily.   multivitamin tablet Take 1 tablet by mouth daily.   OXYGEN Inhale 2 L into the lungs at bedtime.   Trelegy Ellipta 100-62.5-25 MCG/INH Aepb Generic drug: Fluticasone-Umeclidin-Vilant Inhale 1 puff into the lungs daily.   triamcinolone cream 0.1 % Commonly known  as: KENALOG Apply 1 application topically as needed.   venlafaxine 75 MG tablet Commonly known as: EFFEXOR Take 150 mg by mouth every morning. & 75 mg in the evening   vitamin B-12 100 MCG tablet Commonly known as: CYANOCOBALAMIN Take 200 mcg by mouth daily.       Signature:  Chesley Mires, MD [place] Pager - [phone] 12/24/2019, 12:44 PM

## 2020-01-03 DIAGNOSIS — L03115 Cellulitis of right lower limb: Secondary | ICD-10-CM | POA: Diagnosis not present

## 2020-01-05 ENCOUNTER — Ambulatory Visit: Payer: Medicare HMO

## 2020-01-05 ENCOUNTER — Ambulatory Visit: Payer: Medicare HMO | Admitting: Acute Care

## 2020-01-07 DIAGNOSIS — W19XXXA Unspecified fall, initial encounter: Secondary | ICD-10-CM | POA: Diagnosis not present

## 2020-01-07 DIAGNOSIS — L03115 Cellulitis of right lower limb: Secondary | ICD-10-CM | POA: Diagnosis not present

## 2020-01-07 DIAGNOSIS — K449 Diaphragmatic hernia without obstruction or gangrene: Secondary | ICD-10-CM | POA: Diagnosis not present

## 2020-01-07 DIAGNOSIS — Z9181 History of falling: Secondary | ICD-10-CM | POA: Diagnosis not present

## 2020-01-07 DIAGNOSIS — N6459 Other signs and symptoms in breast: Secondary | ICD-10-CM | POA: Diagnosis not present

## 2020-01-07 DIAGNOSIS — D649 Anemia, unspecified: Secondary | ICD-10-CM | POA: Diagnosis not present

## 2020-01-07 DIAGNOSIS — Z0000 Encounter for general adult medical examination without abnormal findings: Secondary | ICD-10-CM | POA: Diagnosis not present

## 2020-01-07 DIAGNOSIS — Z9882 Breast implant status: Secondary | ICD-10-CM | POA: Diagnosis not present

## 2020-01-07 DIAGNOSIS — E782 Mixed hyperlipidemia: Secondary | ICD-10-CM | POA: Diagnosis not present

## 2020-01-07 DIAGNOSIS — J449 Chronic obstructive pulmonary disease, unspecified: Secondary | ICD-10-CM | POA: Diagnosis not present

## 2020-01-07 DIAGNOSIS — Z0001 Encounter for general adult medical examination with abnormal findings: Secondary | ICD-10-CM | POA: Diagnosis not present

## 2020-01-07 DIAGNOSIS — R600 Localized edema: Secondary | ICD-10-CM | POA: Diagnosis not present

## 2020-01-10 ENCOUNTER — Other Ambulatory Visit: Payer: Self-pay

## 2020-01-10 ENCOUNTER — Other Ambulatory Visit (HOSPITAL_COMMUNITY): Payer: Self-pay | Admitting: Adult Health Nurse Practitioner

## 2020-01-10 ENCOUNTER — Ambulatory Visit (HOSPITAL_COMMUNITY)
Admission: RE | Admit: 2020-01-10 | Discharge: 2020-01-10 | Disposition: A | Discharge status: Home / Self Care | Payer: Medicare HMO | Source: Ambulatory Visit | Attending: Adult Health Nurse Practitioner | Admitting: Adult Health Nurse Practitioner

## 2020-01-10 DIAGNOSIS — M79671 Pain in right foot: Secondary | ICD-10-CM

## 2020-01-12 DIAGNOSIS — I10 Essential (primary) hypertension: Secondary | ICD-10-CM | POA: Diagnosis not present

## 2020-01-12 DIAGNOSIS — G8929 Other chronic pain: Secondary | ICD-10-CM | POA: Diagnosis not present

## 2020-01-12 DIAGNOSIS — I4891 Unspecified atrial fibrillation: Secondary | ICD-10-CM | POA: Diagnosis not present

## 2020-01-12 DIAGNOSIS — E782 Mixed hyperlipidemia: Secondary | ICD-10-CM | POA: Diagnosis not present

## 2020-01-12 DIAGNOSIS — M4800 Spinal stenosis, site unspecified: Secondary | ICD-10-CM | POA: Diagnosis not present

## 2020-01-12 DIAGNOSIS — D509 Iron deficiency anemia, unspecified: Secondary | ICD-10-CM | POA: Diagnosis not present

## 2020-01-12 DIAGNOSIS — K219 Gastro-esophageal reflux disease without esophagitis: Secondary | ICD-10-CM | POA: Diagnosis not present

## 2020-01-14 ENCOUNTER — Other Ambulatory Visit: Payer: Self-pay | Admitting: Internal Medicine

## 2020-01-14 ENCOUNTER — Ambulatory Visit (HOSPITAL_COMMUNITY)
Admission: RE | Admit: 2020-01-14 | Discharge: 2020-01-14 | Disposition: A | Discharge status: Home / Self Care | Payer: Medicare HMO | Source: Ambulatory Visit | Attending: Internal Medicine | Admitting: Internal Medicine

## 2020-01-14 ENCOUNTER — Other Ambulatory Visit: Payer: Self-pay

## 2020-01-14 ENCOUNTER — Other Ambulatory Visit (HOSPITAL_COMMUNITY): Payer: Self-pay | Admitting: Internal Medicine

## 2020-01-14 DIAGNOSIS — M79604 Pain in right leg: Secondary | ICD-10-CM | POA: Insufficient documentation

## 2020-01-14 DIAGNOSIS — L03115 Cellulitis of right lower limb: Secondary | ICD-10-CM | POA: Diagnosis not present

## 2020-01-14 DIAGNOSIS — M25551 Pain in right hip: Secondary | ICD-10-CM | POA: Diagnosis not present

## 2020-01-14 DIAGNOSIS — R600 Localized edema: Secondary | ICD-10-CM | POA: Diagnosis not present

## 2020-01-14 DIAGNOSIS — M79661 Pain in right lower leg: Secondary | ICD-10-CM | POA: Diagnosis not present

## 2020-01-18 ENCOUNTER — Ambulatory Visit (INDEPENDENT_AMBULATORY_CARE_PROVIDER_SITE_OTHER): Payer: Medicare HMO | Admitting: Family Medicine

## 2020-01-18 ENCOUNTER — Encounter: Payer: Self-pay | Admitting: Family Medicine

## 2020-01-18 VITALS — BP 128/64 | HR 67 | Ht 66.0 in | Wt 140.0 lb

## 2020-01-18 DIAGNOSIS — I10 Essential (primary) hypertension: Secondary | ICD-10-CM

## 2020-01-18 DIAGNOSIS — R0602 Shortness of breath: Secondary | ICD-10-CM

## 2020-01-18 DIAGNOSIS — E782 Mixed hyperlipidemia: Secondary | ICD-10-CM | POA: Diagnosis not present

## 2020-01-18 DIAGNOSIS — I4819 Other persistent atrial fibrillation: Secondary | ICD-10-CM | POA: Diagnosis not present

## 2020-01-18 NOTE — Progress Notes (Signed)
Cardiology Office Note  Date: 01/18/2020   ID: Amanda Palmer, DOB [birth year], MRN [id number]  PCP:  Celene Squibb, MD  Cardiologist:  No primary care provider on file. Electrophysiologist:  None   Chief Complaint:  f/u persistent atrial fibrillation  History of Present Illness: Amanda Palmer is a 86 y.o. female with a history of atrial fibrillation, HTN, HLD, DOE, vocal cord dysfunction.  Last encounter with Dr Bronson Ing 04/29/2019. Having some exertional dyspnea, relieved with inhalers. No CP, leg swelling, dizziness, palpitations. Atrial fibrillation was stable.  Continuing metoprolol 25 mg twice daily, Eliquis 5 mg po bid,  CHADSVASC = 6. She was scheduled for high resolution CT chest. BP was mildly elevated. There were no changes to therapy. She was continuing statin therapy with Lipitor 20 mg po daily.  She is here today for follow-up and for clearance to undergo 6-minute walk test to check for decreased O2 saturations secondary to COPD for possible continuous O2 at home.  Recently saw Dr. Halford Chessman pulmonology.  An echocardiogram was ordered demonstrating moderate MR with EF 60 to 65%.  Patient is on home O2 at night for nocturnal hypoxia.  She is to undergo a 6-minute walk test at follow-up with pulmonology to qualify for continuous home O2.  EKG today shows normal sinus rhythm LAFB rate of 66.  She has a history of persistent atrial fibrillation.  She denies any anginal symptoms but does have mild to moderate DOE which is chronic.  She has  denies any sensation of palpitations or arrhythmias, orthostatic symptoms.  States she had a recent fall at home with some bruising on her left arm, left shoulder and buttocks.  Denies any fractures.  Denies any PND, orthopnea.  States she has some chronic cough which is productive of clear sputum.  States he recently received an antibiotic for respiratory infection which she has finished.  Denies any active bleeding on Eliquis.  Blood pressure well  controlled.  Past Medical History:  Diagnosis Date  . A-fib ([place])   . Anxiety   . Cancer ([place])    breast  . COPD (chronic obstructive pulmonary disease) ([place])   . Depression   . Hypertension   . TIA (transient ischemic attack)    remote    Past Surgical History:  Procedure Laterality Date  . ABDOMINAL HYSTERECTOMY    . APPENDECTOMY    . BACK SURGERY     total of five surgeries  . BREAST BIOPSY Left    benign  . BREAST CAPSULECTOMY WITH IMPLANT EXCHANGE Right 05/27/2016   Procedure: REMOVAL AND REPLACEMENT OF RIGHT BREAST IMPLANT AND BREAST CAPSULECTOMY;  Surgeon: Cristine Polio, MD;  Location: [facility];  Service: Plastics;  Laterality: Right;  . CARDIAC CATHETERIZATION    . CHOLECYSTECTOMY    . COLONOSCOPY  08/2014   Dr. Britta Mccreedy: Small sessile polyp at the cecum, removed, tubular adenoma  . ESOPHAGOGASTRODUODENOSCOPY  06/2013   Dr. Britta Mccreedy: Hiatal hernia, Schatzki ring, nonobstructive.  Marland Kitchen MASTECTOMY     right   . ORIF ANKLE FRACTURE Left 06/01/2014   Procedure: OPEN REDUCTION INTERNAL FIXATION (ORIF) LEFT ANKLE FRACTURE;  Surgeon: Marybelle Killings, MD;  Location: [facility];  Service: Orthopedics;  Laterality: Left;  . ROTATOR CUFF REPAIR     left  . TOTAL HIP ARTHROPLASTY     right  . TOTAL KNEE ARTHROPLASTY     bilateral    Current Outpatient Medications  Medication Sig Dispense Refill  . albuterol (PROVENTIL  HFA;VENTOLIN HFA) 108 (90 Base) MCG/ACT inhaler Inhale 2 puffs into the lungs every 6 (six) hours as needed for wheezing or shortness of breath.    Marland Kitchen atorvastatin (LIPITOR) 20 MG tablet Take 1 tablet by mouth daily.    . calcium carbonate (OS-CAL - DOSED IN MG OF ELEMENTAL CALCIUM) 1250 (500 Ca) MG tablet Take 1 tablet by mouth daily with breakfast.    . clonazePAM (KLONOPIN) 1 MG tablet Take 1 tablet (1 mg total) by mouth at bedtime. 30 tablet 0  . ELIQUIS 5 MG TABS tablet Take 1 tablet by mouth twice daily 60 tablet 6  . esomeprazole (NEXIUM) 20 MG  capsule Take 20 mg by mouth daily at 12 noon.    . Fluticasone-Umeclidin-Vilant (TRELEGY ELLIPTA) 100-62.5-25 MCG/INH AEPB Inhale 1 puff into the lungs daily. 60 each 5  . guaiFENesin (ROBITUSSIN) 100 MG/5ML SOLN Take 5 mLs (100 mg total) by mouth every 4 (four) hours as needed for cough or to loosen phlegm. 236 mL 0  . hydrochlorothiazide (HYDRODIURIL) 12.5 MG tablet Take 1 tablet by mouth daily.    Marland Kitchen losartan (COZAAR) 50 MG tablet Take 1 tablet by mouth daily.    . metoprolol tartrate (LOPRESSOR) 25 MG tablet Take 1 tablet (25 mg total) by mouth 2 (two) times daily. 180 tablet 3  . Multiple Vitamin (MULTIVITAMIN) tablet Take 1 tablet by mouth daily.    . OXYGEN Inhale 2 L into the lungs at bedtime.    Marland Kitchen Respiratory Therapy Supplies (FLUTTER) DEVI 1 Device by Does not apply route in the morning, at noon, in the evening, and at bedtime. 1 each 0  . triamcinolone cream (KENALOG) 0.1 % Apply 1 application topically as needed.    . venlafaxine (EFFEXOR) 75 MG tablet Take 150 mg by mouth every morning. & 75 mg in the evening    . vitamin B-12 (CYANOCOBALAMIN) 100 MCG tablet Take 200 mcg by mouth daily.      No current facility-administered medications for this visit.   Allergies:  Morphine and related, Sulfa antibiotics, and Penicillins   Social History: The patient  reports that she has never smoked. She has never used smokeless tobacco. She reports that she does not drink alcohol and does not use drugs.   Family History: The patient's family history includes Breast cancer in her sister, sister and another family member; Heart disease in her brother and sister; Leukemia in her brother.   ROS:  Please see the history of present illness. Otherwise, complete review of systems is positive for none.  All other systems are reviewed and negative.   Physical Exam: VS:  BP 128/64   Pulse 67   Ht 5\' 6"  (1.676 m)   Wt 140 lb (63.5 kg)   SpO2 96%   BMI 22.60 kg/m , BMI Body mass index is 22.6  kg/m.  Wt Readings from Last 3 Encounters:  01/18/20 140 lb (63.5 kg)  12/24/19 143 lb 3.2 oz (65 kg)  12/13/19 146 lb 12.8 oz (66.6 kg)    General: Patient appears comfortable at rest. Neck: Supple, no elevated JVP or carotid bruits, no thyromegaly. Lungs: Clear to auscultation, nonlabored breathing at rest. Cardiac: Regular rate and rhythm, no S3 or significant systolic murmur, no pericardial rub. Extremities: No pitting edema, distal pulses 2+. Skin: Warm and dry. Musculoskeletal: No kyphosis. Neuropsychiatric: Alert and oriented x3, affect grossly appropriate.  ECG:  An ECG dated 01/18/2020 was personally reviewed today and demonstrated:  Normal sinus rhythm rate of  66, LAFB, possible anterior infarct, age undetermined.  Recent Labwork: No results found for requested labs within last 8760 hours.  No results found for: CHOL, TRIG, HDL, CHOLHDL, VLDL, LDLCALC, LDLDIRECT  Other Studies Reviewed Today:  Echocardiogram 12/21/2019 1. Left ventricular ejection fraction, by estimation, is 60 to 65%. The left ventricle has normal function. The left ventricle has no regional wall motion abnormalities. Left ventricular diastolic parameters are consistent with Grade II diastolic dysfunction (pseudonormalization). 2. Right ventricular systolic function is normal. The right ventricular size is normal. There is mildly to moderately elevated pulmonary artery systolic pressure. The estimated right ventricular systolic pressure is 78.5 mmHg. 3. Left atrial size was moderately dilated. 4. The mitral valve is abnormal. Moderate mitral valve regurgitation. 5. The aortic valve is tricuspid. Aortic valve regurgitation is not visualized. Mild aortic valve sclerosis is present, with no evidence of aortic valve stenosis. 6. The inferior vena cava is normal in size with greater than 50% respiratory variability, suggesting right atrial pressure of 3 mmHg   Assessment and Plan:  1. Persistent atrial  fibrillation ([place])   2. Essential hypertension   3. Mixed hyperlipidemia   4. Shortness of breath    1. Persistent atrial fibrillation ([facility]) EKG today shows normal sinus rhythm rate of 66, LAFB, possible anterior infarct.  Continue metoprolol 25 mg p.o. twice daily.  Continue Eliquis 5 mg p.o. twice daily.  2. Essential hypertension Blood pressure well controlled with blood pressure 128/64 today.  Continue HCTZ 12.5 mg daily, losartan 50 mg p.o. daily.  3. Mixed hyperlipidemia Continue atorvastatin 20 mg p.o. daily.  4. Shortness of breath/COPD/pulmonary nodules EF 60 to 65%.  G2 DD, moderately elevated PASP estimated at 44.7 mmHg.  LA moderately dilated.  Moderate MR. Recent CT scan in February showed numerous small pulmonary nodules clustered in the right middle lobe and left lower lobe with small consolidation in the medial segment right middle lobe.  Consistent with atypical infection, particularly atypical Mycobacterium.  She will be following with pulmonary soon.  She is scheduled for 6-minute walk test at follow-up with pulmonary.  Medication Adjustments/Labs and Tests Ordered: Current medicines are reviewed at length with the patient today.  Concerns regarding medicines are outlined above.   Disposition: Follow-up with Dr. Harl Bowie or APP 6 months  Signed, Levell July, NP 01/18/2020 2:54 PM    [facility], [place], [place] [address] Phone: [phone]; Fax: [phone]

## 2020-01-18 NOTE — Addendum Note (Signed)
Addended by: Laurine Blazer on: 01/18/2020 03:42 PM   Modules accepted: Orders

## 2020-01-18 NOTE — Patient Instructions (Signed)
Medication Instructions:  Continue all current medications.   Labwork: none  Testing/Procedures: none  Follow-Up: 6 months   Any Other Special Instructions Will Be Listed Below (If Applicable).   If you need a refill on your cardiac medications before your next appointment, please call your pharmacy.  

## 2020-01-24 ENCOUNTER — Telehealth: Payer: Self-pay | Admitting: Orthopedic Surgery

## 2020-01-24 NOTE — Telephone Encounter (Signed)
I left a message for patient to call back about appointment change due to x-ray machine out of order. Waiting for a call back.

## 2020-01-25 ENCOUNTER — Ambulatory Visit (HOSPITAL_COMMUNITY)
Admission: RE | Admit: 2020-01-25 | Discharge: 2020-01-25 | Disposition: A | Discharge status: Home / Self Care | Payer: Medicare HMO | Source: Ambulatory Visit | Attending: Orthopedic Surgery | Admitting: Orthopedic Surgery

## 2020-01-25 ENCOUNTER — Other Ambulatory Visit: Payer: Self-pay

## 2020-01-25 ENCOUNTER — Telehealth: Payer: Self-pay | Admitting: Orthopedic Surgery

## 2020-01-25 ENCOUNTER — Ambulatory Visit (INDEPENDENT_AMBULATORY_CARE_PROVIDER_SITE_OTHER): Payer: Medicare HMO | Admitting: Orthopedic Surgery

## 2020-01-25 ENCOUNTER — Encounter: Payer: Self-pay | Admitting: Orthopedic Surgery

## 2020-01-25 VITALS — BP 182/69 | HR 82 | Ht 66.0 in | Wt 141.0 lb

## 2020-01-25 DIAGNOSIS — M25551 Pain in right hip: Secondary | ICD-10-CM

## 2020-01-25 DIAGNOSIS — M5441 Lumbago with sciatica, right side: Secondary | ICD-10-CM | POA: Insufficient documentation

## 2020-01-25 DIAGNOSIS — Z981 Arthrodesis status: Secondary | ICD-10-CM | POA: Diagnosis not present

## 2020-01-25 DIAGNOSIS — Z96641 Presence of right artificial hip joint: Secondary | ICD-10-CM | POA: Diagnosis not present

## 2020-01-25 DIAGNOSIS — M545 Low back pain, unspecified: Secondary | ICD-10-CM | POA: Diagnosis not present

## 2020-01-25 DIAGNOSIS — Z471 Aftercare following joint replacement surgery: Secondary | ICD-10-CM | POA: Diagnosis not present

## 2020-01-25 MED ORDER — PREDNISONE 10 MG (21) PO TBPK: ORAL_TABLET | ORAL | 0 refills | Status: DC

## 2020-01-25 NOTE — Telephone Encounter (Signed)
Done/appointment completed today, 01/25/20.

## 2020-01-25 NOTE — Progress Notes (Signed)
New Patient Visit  Assessment: Amanda Palmer is a 86 y.o. female with the following: Right  Sided low back pain with sciatica Right greater trochanteric bursitis  Plan: Amanda Palmer has pain in her right buttock, most consistent with sciatica pain.  We reviewed lumbar spine and right hip XR which did not demonstrate any concerning features.  Her right THA is in good position.  Her lumbar fusion is in good position with some degenerative changes cranial and caudal to the fusion construct.  This could be contributing to her pain.  She states she can tolerate the pain over her greater trochanter, but the pain in her buttock is bothering her.  She cannot take NSAIDs as instructed by her PCP so I have provided her with a prescription for a prednisone taper, which shoulder progressively improve her symptoms.  She will remain active otherwise.  If her pain persist or worsen, she will contact the clinic for repeat evaluation.    Follow-up: Return if symptoms worsen or fail to improve.  Subjective:  Chief Complaint  Patient presents with  . Hip Pain    right hip pain, Started the begining of November and hit anle on table and got a cat scratch,     History of Present Illness: Amanda Palmer is a 86 y.o. female who presents for evaluation of right-sided hip pain.  Briefly, she has a history of multiple lumbar surgeries, as well as a right total hip replacement completed in 2010.  She states that she for started to have pain in the right leg approximately 2 weeks ago.  She states it started with a cat scratch on her lower leg, which turned into cellulitis which was treated with oral antibiotics.  Since that started, she started to develop pain in the right buttock, with radiating pains into her leg.  She denies a fall or other event.  She denies any recent fevers or chills.   Review of Systems: No fevers or chills No numbness or tingling No chest pain No shortness of breath No bowel or bladder  dysfunction No GI distress No headaches   Medical History:  Past Medical History:  Diagnosis Date  . A-fib ([place])   . Anxiety   . Cancer ([place])    breast  . COPD (chronic obstructive pulmonary disease) ([place])   . Depression   . Hypertension   . TIA (transient ischemic attack)    remote    Past Surgical History:  Procedure Laterality Date  . ABDOMINAL HYSTERECTOMY    . APPENDECTOMY    . BACK SURGERY     total of five surgeries  . BREAST BIOPSY Left    benign  . BREAST CAPSULECTOMY WITH IMPLANT EXCHANGE Right 05/27/2016   Procedure: REMOVAL AND REPLACEMENT OF RIGHT BREAST IMPLANT AND BREAST CAPSULECTOMY;  Surgeon: Amanda Polio, MD;  Location: [facility];  Service: Plastics;  Laterality: Right;  . CARDIAC CATHETERIZATION    . CHOLECYSTECTOMY    . COLONOSCOPY  08/2014   Dr. Britta Palmer: Small sessile polyp at the cecum, removed, tubular adenoma  . ESOPHAGOGASTRODUODENOSCOPY  06/2013   Dr. Britta Palmer: Hiatal hernia, Schatzki ring, nonobstructive.  Marland Kitchen MASTECTOMY     right   . ORIF ANKLE FRACTURE Left 06/01/2014   Procedure: OPEN REDUCTION INTERNAL FIXATION (ORIF) LEFT ANKLE FRACTURE;  Surgeon: Amanda Killings, MD;  Location: [facility];  Service: Orthopedics;  Laterality: Left;  . ROTATOR CUFF REPAIR     left  . TOTAL HIP ARTHROPLASTY  right  . TOTAL KNEE ARTHROPLASTY     bilateral    Family History  Problem Relation Age of Onset  . Heart disease Brother   . Heart disease Sister   . Breast cancer Sister   . Leukemia Brother   . Breast cancer Sister   . Breast cancer Other        one deceased, one living  . Colon cancer Neg Hx    Social History   Tobacco Use  . Smoking status: Never Smoker  . Smokeless tobacco: Never Used  Vaping Use  . Vaping Use: Never used  Substance Use Topics  . Alcohol use: No    Alcohol/week: 0.0 standard drinks  . Drug use: No    Allergies  Allergen Reactions  . Morphine And Related Nausea And Vomiting  . Penicillins Rash  .  Sulfa Antibiotics Nausea And Vomiting    Current Meds  Medication Sig  . albuterol (PROVENTIL HFA;VENTOLIN HFA) 108 (90 Base) MCG/ACT inhaler Inhale 2 puffs into the lungs every 6 (six) hours as needed for wheezing or shortness of breath.  Marland Kitchen atorvastatin (LIPITOR) 20 MG tablet Take 1 tablet by mouth daily.  . calcium carbonate (OS-CAL - DOSED IN MG OF ELEMENTAL CALCIUM) 1250 (500 Ca) MG tablet Take 1 tablet by mouth daily with breakfast.  . clonazePAM (KLONOPIN) 1 MG tablet Take 1 tablet (1 mg total) by mouth at bedtime.  Marland Kitchen ELIQUIS 5 MG TABS tablet Take 1 tablet by mouth twice daily  . esomeprazole (NEXIUM) 20 MG capsule Take 20 mg by mouth daily at 12 noon.  . Fluticasone-Umeclidin-Vilant (TRELEGY ELLIPTA) 100-62.5-25 MCG/INH AEPB Inhale 1 puff into the lungs daily.  Marland Kitchen guaiFENesin (ROBITUSSIN) 100 MG/5ML SOLN Take 5 mLs (100 mg total) by mouth every 4 (four) hours as needed for cough or to loosen phlegm.  . hydrochlorothiazide (HYDRODIURIL) 12.5 MG tablet Take 1 tablet by mouth daily.  Marland Kitchen losartan (COZAAR) 50 MG tablet Take 1 tablet by mouth daily.  . metoprolol tartrate (LOPRESSOR) 25 MG tablet Take 1 tablet (25 mg total) by mouth 2 (two) times daily.  . Multiple Vitamin (MULTIVITAMIN) tablet Take 1 tablet by mouth daily.  . OXYGEN Inhale 2 L into the lungs at bedtime.  Marland Kitchen Respiratory Therapy Supplies (FLUTTER) DEVI 1 Device by Does not apply route in the morning, at noon, in the evening, and at bedtime.  . triamcinolone cream (KENALOG) 0.1 % Apply 1 application topically as needed.  . venlafaxine (EFFEXOR) 75 MG tablet Take 150 mg by mouth every morning. & 75 mg in the evening  . vitamin B-12 (CYANOCOBALAMIN) 100 MCG tablet Take 200 mcg by mouth daily.     Objective: BP (!) 182/69   Pulse 82   Ht 5\' 6"  (1.676 m)   Wt 141 lb (64 kg)   BMI 22.76 kg/m   Physical Exam:  General: Alert and oriented, no acute distress. Gait: Right-sided antalgic gait  Evaluation of the right hip  demonstrates well-healed surgical incision.  There are some small nodules which are nontender to palpation.  She has tenderness palpation over the greater trochanter.  Negative straight leg raise.  She tolerates flexion to approximately 90 degrees, with 20 degrees external rotation and internal rotation without discomfort.  Some mild discomfort with axial loading.  Strength in the right lower extremity is 5/5 throughout.  Sensation is intact distally as well.  Mild tenderness to palpation in her lower back.    IMAGING: I personally ordered and reviewed the  following images   X-rays of the right hip demonstrates a well-positioned total hip arthroplasty without signs of subsidence or loosening.  X-rays of the lumbar spine demonstrate a well-positioned lumbar fusion with evidence of healing.  There is some degenerative changes cranial and caudal to the lumbar fusion construct.   New Medications:  Meds ordered this encounter  Medications  . predniSONE (STERAPRED UNI-PAK 21 TAB) 10 MG (21) TBPK tablet    Sig: Take as directed on pack; 12 day course    Dispense:  48 tablet    Refill:  0      Mordecai Rasmussen, MD  01/25/2020 12:28 PM

## 2020-01-25 NOTE — Telephone Encounter (Signed)
Call received from [place] at [facility] with question on instructions for medication prescribed today. Please call back to [phone]. (pharmacy in chart shown as [facility])

## 2020-01-26 ENCOUNTER — Ambulatory Visit (INDEPENDENT_AMBULATORY_CARE_PROVIDER_SITE_OTHER): Payer: Medicare HMO

## 2020-01-26 ENCOUNTER — Other Ambulatory Visit: Payer: Self-pay

## 2020-01-26 DIAGNOSIS — R0600 Dyspnea, unspecified: Secondary | ICD-10-CM | POA: Diagnosis not present

## 2020-01-26 DIAGNOSIS — J449 Chronic obstructive pulmonary disease, unspecified: Secondary | ICD-10-CM

## 2020-01-26 DIAGNOSIS — R0609 Other forms of dyspnea: Secondary | ICD-10-CM

## 2020-01-26 NOTE — Progress Notes (Signed)
Six Minute Walk - 01/26/20 1048      Six Minute Walk   Medications taken before test (dose and time) Nexium 20mg     Supplemental oxygen during test? No    Lap distance in meters  34 meters    Laps Completed 4    Partial lap (in meters) 0 meters    Baseline BP (sitting) 116/72    Baseline Heartrate 75    Baseline Dyspnea (Borg Scale) 0    Baseline Fatigue (Borg Scale) 3    Baseline SPO2 97 %      End of Test Values    BP (sitting) 126/84    Heartrate 101    Dyspnea (Borg Scale) 7    Fatigue (Borg Scale) 7    SPO2 98 %      2 Minutes Post Walk Values   BP (sitting) 122/80    Heartrate 94    SPO2 99 %    Stopped or paused before six minutes? Yes    Reason: Pt had a very unsteady gait and was very short of breath. Test was stopped with 3:56 left on the timer.      Interpretation   Distance completed 136 meters

## 2020-01-27 NOTE — Telephone Encounter (Signed)
Amy called and took care of this for me. No other concerns.

## 2020-02-07 ENCOUNTER — Telehealth: Payer: Self-pay | Admitting: Orthopedic Surgery

## 2020-02-07 DIAGNOSIS — J449 Chronic obstructive pulmonary disease, unspecified: Secondary | ICD-10-CM | POA: Diagnosis not present

## 2020-02-07 NOTE — Telephone Encounter (Signed)
Called patient to offer, left voice message to return call.

## 2020-02-07 NOTE — Telephone Encounter (Signed)
Patient called to relay that her "leg pain" is still ongoing - mainly right, same leg that, as noted, started with a cat scratch.  Patient states she has been taking the Prednisone prescribed by Dr Amedeo Kinsman, and that this is the last day.  Schedule another appointment, or other recommendation?

## 2020-02-07 NOTE — Telephone Encounter (Signed)
Please have her come back in for a visit with Dr. Amedeo Kinsman.

## 2020-02-08 DIAGNOSIS — R600 Localized edema: Secondary | ICD-10-CM | POA: Diagnosis not present

## 2020-02-08 DIAGNOSIS — M25551 Pain in right hip: Secondary | ICD-10-CM | POA: Diagnosis not present

## 2020-02-08 DIAGNOSIS — L03115 Cellulitis of right lower limb: Secondary | ICD-10-CM | POA: Diagnosis not present

## 2020-02-08 DIAGNOSIS — M79604 Pain in right leg: Secondary | ICD-10-CM | POA: Diagnosis not present

## 2020-02-09 NOTE — Telephone Encounter (Signed)
Patient returned call; appointment scheduled. Patient states she saw her primary care, Dr. Jenny Reichmann "Zack" Perry. Notes will be requested for upcoming appointment.

## 2020-02-15 ENCOUNTER — Encounter: Payer: Self-pay | Admitting: Orthopedic Surgery

## 2020-02-15 ENCOUNTER — Other Ambulatory Visit: Payer: Self-pay

## 2020-02-15 ENCOUNTER — Ambulatory Visit (INDEPENDENT_AMBULATORY_CARE_PROVIDER_SITE_OTHER): Payer: Medicare HMO | Admitting: Orthopedic Surgery

## 2020-02-15 VITALS — BP 130/56 | HR 63 | Ht 64.0 in | Wt 139.0 lb

## 2020-02-15 DIAGNOSIS — M5441 Lumbago with sciatica, right side: Secondary | ICD-10-CM

## 2020-02-15 DIAGNOSIS — M25551 Pain in right hip: Secondary | ICD-10-CM | POA: Diagnosis not present

## 2020-02-15 MED ORDER — GABAPENTIN 100 MG PO CAPS: 200.0000 mg | ORAL_CAPSULE | Freq: Two times a day (BID) | ORAL | 0 refills | Status: DC

## 2020-02-15 NOTE — Progress Notes (Signed)
Orthopaedic Clinic Return  Assessment: Amanda Palmer is a 86 y.o. female with the following: 1.  Right sided LBP with radiculopathy in setting of previous lumbar fusion ~20 years ago 2.  Right greater trochanteric bursitis  Plan: [name] continues to have right posterior thigh and buttock pain, with radiation distally.  She has completed 2 prednisone Dosepaks with limited improvement in her symptoms.  She states gabapentin, taken twice daily improves the pain slightly.  Based on the constellation of symptoms, I feel that the most likely cause of her pain is emanating from her back.  As a result, I recommend a referral to neurosurgery for further evaluation, possibly to include a repeat MRI.  In the interim, I recommended that she increase the dose of gabapentin to 200 mg twice daily in an attempt to improve her current discomfort.  I also told her that it is possible to increase the dose further, if she feels this is necessary.  At any time, if she wishes to return to clinic for an injection of her greater trochanteric bursitis, I am happy to provide this for her.  At this point in time, she is very reluctant to proceed with an injection for fear of infection, which could seed her right hip replacement, bilateral knee replacements or hardware in her lower back, as well as the possibility of sepsis.  I acknowledged her concerns, and expressed to her that the likelihood of this happening is low, but does remain a possibility.  All questions were answered, and she is amenable to this plan.  No follow-up is needed at this time.  If she does not hear from the neurosurgeons office regarding this referral, I have instructed her to contact our clinic, to see if we could expedite her care.  Meds ordered this encounter  Medications  . gabapentin (NEURONTIN) 100 MG capsule    Sig: Take 2 capsules (200 mg total) by mouth 2 (two) times daily.    Dispense:  120 capsule    Refill:  0    Body mass index is  23.86 kg/m.  Follow-up: No follow-ups on file.   Subjective:  Chief Complaint  Patient presents with  . Back Pain    pain going into calf/leg is swollen    History of Present Illness: Amanda Palmer is a 86 y.o. female who returns to clinic for repeat evaluation of her right lower extremity pain.  She was last seen in clinic approximately 1 month ago, at which time I provided a prednisone Dosepak for presumed right-sided radiculopathy.  She completed this Dosepak, and her primary care provider gave her an additional dose pack.  This has not improved her pain.  She is taking gabapentin, 100 mg twice daily, which does provide some improvement in her symptoms.  The pain is primarily located in the right buttock, but radiates distally beyond the level of her right knee.  She also continues to have some tenderness over the lateral hip, as well as some swelling in this area.  Overall, she is struggling due to the pain, and is adamant that we provide some relief however possible.  Review of Systems: No fevers or chills No chest pain No shortness of breath No bowel or bladder dysfunction No GI distress No headaches  Objective: BP (!) 130/56   Pulse 63   Ht 5\' 4"  (1.626 m)   Wt 139 lb (63 kg)   BMI 23.86 kg/m   Physical Exam:  Alert and oriented.  In  some obvious discomfort.  Lateral hip incision is healed, without surrounding erythema or drainage.  Tenderness palpation over the greater trochanter.  Well-healed scratch over the anterior lower shin.  There is no swelling, redness or other concerns of infection in the right ankle.  Sensation is intact distally.  Toes are warm and well perfused.  IMAGING: I personally ordered and reviewed the following images:  No new imaging obtained.  Amanda Rasmussen, MD 02/15/2020 10:51 PM

## 2020-02-18 DIAGNOSIS — I10 Essential (primary) hypertension: Secondary | ICD-10-CM | POA: Diagnosis not present

## 2020-02-18 DIAGNOSIS — M5416 Radiculopathy, lumbar region: Secondary | ICD-10-CM | POA: Diagnosis not present

## 2020-02-21 ENCOUNTER — Other Ambulatory Visit (HOSPITAL_COMMUNITY): Payer: Self-pay | Admitting: Internal Medicine

## 2020-02-21 ENCOUNTER — Other Ambulatory Visit: Payer: Self-pay | Admitting: Neurosurgery

## 2020-02-21 DIAGNOSIS — M25551 Pain in right hip: Secondary | ICD-10-CM

## 2020-02-21 DIAGNOSIS — M5416 Radiculopathy, lumbar region: Secondary | ICD-10-CM

## 2020-02-25 DIAGNOSIS — D649 Anemia, unspecified: Secondary | ICD-10-CM | POA: Diagnosis not present

## 2020-02-25 DIAGNOSIS — Z9882 Breast implant status: Secondary | ICD-10-CM | POA: Diagnosis not present

## 2020-02-25 DIAGNOSIS — N6459 Other signs and symptoms in breast: Secondary | ICD-10-CM | POA: Diagnosis not present

## 2020-02-25 DIAGNOSIS — M79604 Pain in right leg: Secondary | ICD-10-CM | POA: Diagnosis not present

## 2020-02-25 DIAGNOSIS — Z0000 Encounter for general adult medical examination without abnormal findings: Secondary | ICD-10-CM | POA: Diagnosis not present

## 2020-02-25 DIAGNOSIS — Z0001 Encounter for general adult medical examination with abnormal findings: Secondary | ICD-10-CM | POA: Diagnosis not present

## 2020-02-25 DIAGNOSIS — M25551 Pain in right hip: Secondary | ICD-10-CM | POA: Diagnosis not present

## 2020-02-25 DIAGNOSIS — E782 Mixed hyperlipidemia: Secondary | ICD-10-CM | POA: Diagnosis not present

## 2020-02-25 DIAGNOSIS — W19XXXA Unspecified fall, initial encounter: Secondary | ICD-10-CM | POA: Diagnosis not present

## 2020-03-01 DIAGNOSIS — Z7409 Other reduced mobility: Secondary | ICD-10-CM | POA: Diagnosis not present

## 2020-03-01 DIAGNOSIS — M818 Other osteoporosis without current pathological fracture: Secondary | ICD-10-CM | POA: Diagnosis not present

## 2020-03-01 DIAGNOSIS — F419 Anxiety disorder, unspecified: Secondary | ICD-10-CM | POA: Diagnosis not present

## 2020-03-01 DIAGNOSIS — I10 Essential (primary) hypertension: Secondary | ICD-10-CM | POA: Diagnosis not present

## 2020-03-01 DIAGNOSIS — R0602 Shortness of breath: Secondary | ICD-10-CM | POA: Diagnosis not present

## 2020-03-01 DIAGNOSIS — J449 Chronic obstructive pulmonary disease, unspecified: Secondary | ICD-10-CM | POA: Diagnosis not present

## 2020-03-01 DIAGNOSIS — E785 Hyperlipidemia, unspecified: Secondary | ICD-10-CM | POA: Diagnosis not present

## 2020-03-01 DIAGNOSIS — Z0001 Encounter for general adult medical examination with abnormal findings: Secondary | ICD-10-CM | POA: Diagnosis not present

## 2020-03-01 DIAGNOSIS — K219 Gastro-esophageal reflux disease without esophagitis: Secondary | ICD-10-CM | POA: Diagnosis not present

## 2020-03-01 DIAGNOSIS — I482 Chronic atrial fibrillation, unspecified: Secondary | ICD-10-CM | POA: Diagnosis not present

## 2020-03-06 ENCOUNTER — Other Ambulatory Visit: Payer: Self-pay

## 2020-03-06 ENCOUNTER — Ambulatory Visit (HOSPITAL_COMMUNITY)
Admission: RE | Admit: 2020-03-06 | Discharge: 2020-03-06 | Disposition: A | Discharge status: Home / Self Care | Payer: Medicare HMO | Source: Ambulatory Visit | Attending: Internal Medicine | Admitting: Internal Medicine

## 2020-03-06 DIAGNOSIS — M25551 Pain in right hip: Secondary | ICD-10-CM | POA: Diagnosis not present

## 2020-03-06 DIAGNOSIS — M25451 Effusion, right hip: Secondary | ICD-10-CM | POA: Diagnosis not present

## 2020-03-06 DIAGNOSIS — M6258 Muscle wasting and atrophy, not elsewhere classified, other site: Secondary | ICD-10-CM | POA: Diagnosis not present

## 2020-03-06 DIAGNOSIS — R600 Localized edema: Secondary | ICD-10-CM | POA: Diagnosis not present

## 2020-03-08 DIAGNOSIS — J449 Chronic obstructive pulmonary disease, unspecified: Secondary | ICD-10-CM | POA: Diagnosis not present

## 2020-03-10 DIAGNOSIS — J449 Chronic obstructive pulmonary disease, unspecified: Secondary | ICD-10-CM | POA: Diagnosis not present

## 2020-03-10 DIAGNOSIS — M79604 Pain in right leg: Secondary | ICD-10-CM | POA: Diagnosis not present

## 2020-03-10 DIAGNOSIS — R600 Localized edema: Secondary | ICD-10-CM | POA: Diagnosis not present

## 2020-03-10 DIAGNOSIS — L03115 Cellulitis of right lower limb: Secondary | ICD-10-CM | POA: Diagnosis not present

## 2020-03-10 DIAGNOSIS — Z7401 Bed confinement status: Secondary | ICD-10-CM | POA: Diagnosis not present

## 2020-03-10 DIAGNOSIS — E782 Mixed hyperlipidemia: Secondary | ICD-10-CM | POA: Diagnosis not present

## 2020-03-10 DIAGNOSIS — M25551 Pain in right hip: Secondary | ICD-10-CM | POA: Diagnosis not present

## 2020-03-10 DIAGNOSIS — K219 Gastro-esophageal reflux disease without esophagitis: Secondary | ICD-10-CM | POA: Diagnosis not present

## 2020-03-17 DIAGNOSIS — R69 Illness, unspecified: Secondary | ICD-10-CM | POA: Diagnosis not present

## 2020-03-17 DIAGNOSIS — Z7401 Bed confinement status: Secondary | ICD-10-CM | POA: Diagnosis not present

## 2020-03-17 DIAGNOSIS — J449 Chronic obstructive pulmonary disease, unspecified: Secondary | ICD-10-CM | POA: Diagnosis not present

## 2020-03-17 DIAGNOSIS — E782 Mixed hyperlipidemia: Secondary | ICD-10-CM | POA: Diagnosis not present

## 2020-03-17 DIAGNOSIS — K219 Gastro-esophageal reflux disease without esophagitis: Secondary | ICD-10-CM | POA: Diagnosis not present

## 2020-03-17 DIAGNOSIS — I739 Peripheral vascular disease, unspecified: Secondary | ICD-10-CM | POA: Diagnosis not present

## 2020-03-17 DIAGNOSIS — I482 Chronic atrial fibrillation, unspecified: Secondary | ICD-10-CM | POA: Diagnosis not present

## 2020-03-17 DIAGNOSIS — M818 Other osteoporosis without current pathological fracture: Secondary | ICD-10-CM | POA: Diagnosis not present

## 2020-03-17 DIAGNOSIS — I10 Essential (primary) hypertension: Secondary | ICD-10-CM | POA: Diagnosis not present

## 2020-03-18 ENCOUNTER — Other Ambulatory Visit: Payer: Self-pay

## 2020-03-18 ENCOUNTER — Ambulatory Visit
Admission: RE | Admit: 2020-03-18 | Discharge: 2020-03-18 | Disposition: A | Discharge status: Home / Self Care | Payer: Medicare HMO | Source: Ambulatory Visit | Attending: Neurosurgery | Admitting: Neurosurgery

## 2020-03-18 DIAGNOSIS — M48061 Spinal stenosis, lumbar region without neurogenic claudication: Secondary | ICD-10-CM | POA: Diagnosis not present

## 2020-03-18 DIAGNOSIS — M5416 Radiculopathy, lumbar region: Secondary | ICD-10-CM

## 2020-03-22 ENCOUNTER — Other Ambulatory Visit: Payer: Self-pay | Admitting: Orthopedic Surgery

## 2020-03-22 DIAGNOSIS — I10 Essential (primary) hypertension: Secondary | ICD-10-CM | POA: Diagnosis not present

## 2020-03-22 DIAGNOSIS — I482 Chronic atrial fibrillation, unspecified: Secondary | ICD-10-CM | POA: Diagnosis not present

## 2020-03-22 DIAGNOSIS — Z7401 Bed confinement status: Secondary | ICD-10-CM | POA: Diagnosis not present

## 2020-03-22 DIAGNOSIS — R69 Illness, unspecified: Secondary | ICD-10-CM | POA: Diagnosis not present

## 2020-03-22 DIAGNOSIS — M818 Other osteoporosis without current pathological fracture: Secondary | ICD-10-CM | POA: Diagnosis not present

## 2020-03-22 DIAGNOSIS — E782 Mixed hyperlipidemia: Secondary | ICD-10-CM | POA: Diagnosis not present

## 2020-03-22 DIAGNOSIS — J449 Chronic obstructive pulmonary disease, unspecified: Secondary | ICD-10-CM | POA: Diagnosis not present

## 2020-03-22 DIAGNOSIS — I739 Peripheral vascular disease, unspecified: Secondary | ICD-10-CM | POA: Diagnosis not present

## 2020-03-22 DIAGNOSIS — K219 Gastro-esophageal reflux disease without esophagitis: Secondary | ICD-10-CM | POA: Diagnosis not present

## 2020-03-23 ENCOUNTER — Other Ambulatory Visit: Payer: Self-pay | Admitting: Neurosurgery

## 2020-03-23 DIAGNOSIS — I482 Chronic atrial fibrillation, unspecified: Secondary | ICD-10-CM | POA: Diagnosis not present

## 2020-03-23 DIAGNOSIS — R69 Illness, unspecified: Secondary | ICD-10-CM | POA: Diagnosis not present

## 2020-03-23 DIAGNOSIS — I10 Essential (primary) hypertension: Secondary | ICD-10-CM | POA: Diagnosis not present

## 2020-03-23 DIAGNOSIS — M5416 Radiculopathy, lumbar region: Secondary | ICD-10-CM

## 2020-03-23 DIAGNOSIS — Z7401 Bed confinement status: Secondary | ICD-10-CM | POA: Diagnosis not present

## 2020-03-23 DIAGNOSIS — M818 Other osteoporosis without current pathological fracture: Secondary | ICD-10-CM | POA: Diagnosis not present

## 2020-03-23 DIAGNOSIS — J449 Chronic obstructive pulmonary disease, unspecified: Secondary | ICD-10-CM | POA: Diagnosis not present

## 2020-03-23 DIAGNOSIS — E782 Mixed hyperlipidemia: Secondary | ICD-10-CM | POA: Diagnosis not present

## 2020-03-23 DIAGNOSIS — K219 Gastro-esophageal reflux disease without esophagitis: Secondary | ICD-10-CM | POA: Diagnosis not present

## 2020-03-23 DIAGNOSIS — I739 Peripheral vascular disease, unspecified: Secondary | ICD-10-CM | POA: Diagnosis not present

## 2020-03-24 ENCOUNTER — Other Ambulatory Visit: Payer: Self-pay

## 2020-03-24 ENCOUNTER — Encounter: Payer: Self-pay | Admitting: Orthopedic Surgery

## 2020-03-24 ENCOUNTER — Ambulatory Visit: Payer: Medicare HMO | Admitting: Orthopedic Surgery

## 2020-03-24 VITALS — BP 175/85 | HR 125 | Ht 64.0 in

## 2020-03-24 DIAGNOSIS — M25551 Pain in right hip: Secondary | ICD-10-CM | POA: Diagnosis not present

## 2020-03-24 DIAGNOSIS — Z96641 Presence of right artificial hip joint: Secondary | ICD-10-CM

## 2020-03-24 NOTE — Progress Notes (Signed)
Orthopaedic Clinic Return  Assessment: Amanda Palmer is a 87 y.o. female with the following: 1.  Right sided LBP with radiculopathy in setting of previous lumbar fusion ~20 years ago 2.  Right greater trochanteric bursitis; fluid collection on MRI  Plan: Amanda Palmer continues to have right posterior thigh and buttock pain, with radiation distally.  She had a follow-up visit with the neurosurgeon recently, and is awaiting to review the results of her lumbar spine MRI.  This is scheduled for next week.  I encouraged her to attend this follow-up meeting.  Regarding the right hip, her medical doctor ordered a right hip MRI which demonstrates a relatively small fluid collection over her lateral hip.  At this point, it is unclear what is causing this fluid collection.  She does have a fair amount of tenderness in this area.  At this point in time, I am reluctant to drain this fluid, she does have a right total hip arthroplasty.  My plan is discussed this with my partner, to see if he has any further input regarding the source of bleeding fluid collection.  Ultimately, she may have to be evaluated by an adult reconstruction specialist to determine the next steps.  My concern is the potential to seeding the right hip arthroplasty, and then have to refer her to one of my colleagues for further treatment.  I will contact her within the next 1-2 weeks to update her on the plan.  If she has not heard from you in that timeframe, I have encouraged her to contact clinic again.  No follow-up is needed in clinic at this time.    Body mass index is 23.86 kg/m.  Follow-up: No follow-ups on file.   Subjective:  Chief Complaint  Patient presents with  . Hip Pain    Right leg, patient reports she was told that she has fluid on her hip.    History of Present Illness: Amanda Palmer is a 87 y.o. female who returns to clinic for repeat evaluation of her right lower extremity pain.  I have now seen her in clinic  twice.  Since the last time I saw her, she was evaluated by a neurosurgeon, who ordered an MRI.  The results and repeat evaluation are pending at this time.  In addition, Dr. [name] ordered an MRI of her right hip.  Based on this imaging, she has a fluid collection over the lateral hip.  The source is unclear.  She does not have any symptoms of an infection including fevers, chills, general malaise redness or drainage from the surgical incision.  Review of Systems: No fevers or chills No chest pain No shortness of breath No bowel or bladder dysfunction No GI distress No headaches  Objective: BP (!) 175/85   Pulse (!) 125   Ht 5\' 4"  (1.626 m)   BMI 23.86 kg/m   Physical Exam:  Alert and oriented.  Seated in a wheelchair.  Lateral hip incision is healed, without surrounding erythema or drainage.  Tenderness palpation over the greater trochanter.  There is some bogginess in this area.  This is tender to palpation.  IMAGING: I personally ordered and reviewed the following images:  MRI of the right hip demonstrates a fluid collection over the lateral hip.  There is to be superficial to the IT band.  There is no obvious communication with the right total hip arthroplasty.   Mordecai Rasmussen, MD 03/24/2020 1:14 PM

## 2020-03-29 DIAGNOSIS — I739 Peripheral vascular disease, unspecified: Secondary | ICD-10-CM | POA: Diagnosis not present

## 2020-03-29 DIAGNOSIS — K219 Gastro-esophageal reflux disease without esophagitis: Secondary | ICD-10-CM | POA: Diagnosis not present

## 2020-03-29 DIAGNOSIS — J449 Chronic obstructive pulmonary disease, unspecified: Secondary | ICD-10-CM | POA: Diagnosis not present

## 2020-03-29 DIAGNOSIS — M818 Other osteoporosis without current pathological fracture: Secondary | ICD-10-CM | POA: Diagnosis not present

## 2020-03-29 DIAGNOSIS — E782 Mixed hyperlipidemia: Secondary | ICD-10-CM | POA: Diagnosis not present

## 2020-03-29 DIAGNOSIS — Z7401 Bed confinement status: Secondary | ICD-10-CM | POA: Diagnosis not present

## 2020-03-29 DIAGNOSIS — I482 Chronic atrial fibrillation, unspecified: Secondary | ICD-10-CM | POA: Diagnosis not present

## 2020-03-29 DIAGNOSIS — I10 Essential (primary) hypertension: Secondary | ICD-10-CM | POA: Diagnosis not present

## 2020-03-29 DIAGNOSIS — R69 Illness, unspecified: Secondary | ICD-10-CM | POA: Diagnosis not present

## 2020-04-03 DIAGNOSIS — M5416 Radiculopathy, lumbar region: Secondary | ICD-10-CM | POA: Diagnosis not present

## 2020-04-03 DIAGNOSIS — I10 Essential (primary) hypertension: Secondary | ICD-10-CM | POA: Diagnosis not present

## 2020-04-04 DIAGNOSIS — Z7409 Other reduced mobility: Secondary | ICD-10-CM | POA: Diagnosis not present

## 2020-04-04 DIAGNOSIS — E785 Hyperlipidemia, unspecified: Secondary | ICD-10-CM | POA: Diagnosis not present

## 2020-04-04 DIAGNOSIS — E782 Mixed hyperlipidemia: Secondary | ICD-10-CM | POA: Diagnosis not present

## 2020-04-04 DIAGNOSIS — I482 Chronic atrial fibrillation, unspecified: Secondary | ICD-10-CM | POA: Diagnosis not present

## 2020-04-04 DIAGNOSIS — Z7401 Bed confinement status: Secondary | ICD-10-CM | POA: Diagnosis not present

## 2020-04-04 DIAGNOSIS — R69 Illness, unspecified: Secondary | ICD-10-CM | POA: Diagnosis not present

## 2020-04-04 DIAGNOSIS — I739 Peripheral vascular disease, unspecified: Secondary | ICD-10-CM | POA: Diagnosis not present

## 2020-04-04 DIAGNOSIS — M818 Other osteoporosis without current pathological fracture: Secondary | ICD-10-CM | POA: Diagnosis not present

## 2020-04-04 DIAGNOSIS — I10 Essential (primary) hypertension: Secondary | ICD-10-CM | POA: Diagnosis not present

## 2020-04-04 DIAGNOSIS — R0602 Shortness of breath: Secondary | ICD-10-CM | POA: Diagnosis not present

## 2020-04-04 DIAGNOSIS — K219 Gastro-esophageal reflux disease without esophagitis: Secondary | ICD-10-CM | POA: Diagnosis not present

## 2020-04-04 DIAGNOSIS — J449 Chronic obstructive pulmonary disease, unspecified: Secondary | ICD-10-CM | POA: Diagnosis not present

## 2020-04-05 ENCOUNTER — Ambulatory Visit: Payer: Medicare HMO | Admitting: Pulmonary Disease

## 2020-04-05 ENCOUNTER — Encounter: Payer: Self-pay | Admitting: Pulmonary Disease

## 2020-04-05 ENCOUNTER — Other Ambulatory Visit: Payer: Self-pay

## 2020-04-05 VITALS — BP 116/72 | HR 125 | Temp 97.0°F | Ht 64.0 in | Wt 148.4 lb

## 2020-04-05 DIAGNOSIS — J441 Chronic obstructive pulmonary disease with (acute) exacerbation: Secondary | ICD-10-CM | POA: Diagnosis not present

## 2020-04-05 DIAGNOSIS — J439 Emphysema, unspecified: Secondary | ICD-10-CM

## 2020-04-05 DIAGNOSIS — J449 Chronic obstructive pulmonary disease, unspecified: Secondary | ICD-10-CM

## 2020-04-05 DIAGNOSIS — G4736 Sleep related hypoventilation in conditions classified elsewhere: Secondary | ICD-10-CM

## 2020-04-05 MED ORDER — DOXYCYCLINE HYCLATE 100 MG PO TABS: 100.0000 mg | ORAL_TABLET | Freq: Two times a day (BID) | ORAL | 0 refills | Status: DC

## 2020-04-05 MED ORDER — PREDNISONE 10 MG PO TABS: ORAL_TABLET | ORAL | 0 refills | Status: AC

## 2020-04-05 MED ORDER — ALBUTEROL SULFATE (2.5 MG/3ML) 0.083% IN NEBU: 2.5000 mg | INHALATION_SOLUTION | Freq: Four times a day (QID) | RESPIRATORY_TRACT | 5 refills | Status: DC | PRN

## 2020-04-05 NOTE — Patient Instructions (Signed)
Doxycycline antibiotic 100 mg pill twice per day for 7 days  Prednisone 10 mg pill >> 3 pills daily for 2 days, 2 pills daily for 2 days, 1 pill daily for 2 days  Will arrange for home nebulizer machine  Albuterol one vial nebulized twice per day until your symptoms improve, then as needed  Follow up in 4 months

## 2020-04-05 NOTE — Progress Notes (Signed)
[facility]  Chief Complaint  Patient presents with  . Follow-up    Productive cough with clear phlegm, sometimes has a little red and yellow, wheezing, shortness of breath, body swelling     Constitutional:  BP 116/72 (BP Location: Right Arm, Cuff Size: Normal)   Pulse (!) 125   Temp (!) 97 F (36.1 C) (Other (Comment)) Comment (Src): wrist  Ht 5\' 4"  (1.626 m)   Wt 148 lb 6.4 oz (67.3 kg)   SpO2 94% Comment: Room air  BMI 25.47 kg/m   Past Medical History:  HTN, Sycope, GERD, A fib, Anxiety, Breast cancer, Depression, HTN, TIA, HLD  Past Surgical History:  Her  has a past surgical history that includes Cardiac catheterization; Total knee arthroplasty; Total hip arthroplasty; Rotator cuff repair; Mastectomy; ORIF ankle fracture (Left, 06/01/2014); Breast capsulectomy with implant exchange (Right, 05/27/2016); Esophagogastroduodenoscopy (06/2013); Colonoscopy (08/2014); Back surgery; Abdominal hysterectomy; Cholecystectomy; Appendectomy; and Breast biopsy (Left).  Brief Summary:  Amanda Palmer is a 87 y.o. female with COPD and nocturnal hypoxemia.      Subjective:   She developed cough, wheeze, yellow sputum with some blood streaking.  Was seen by PCP and started on diuretic for swelling.  Doesn't feel like trelegy is getting in all the way since she had flare up.  Physical Exam:   Appearance - in wheel chair  ENMT - no sinus tenderness, no oral exudate, no LAN, Mallampati 3 airway, no stridor  Respiratory - b/l expiratory wheezing  CV - s1s2 regular rate and rhythm, 2/6 murmurs  Ext - no clubbing, no edema  Skin - no rashes  Psych - normal mood and affect    Pulmonary testing:   PFT 11/09/13 >> FEV1 1.10 (54%), FEV1% 68, TLC 5.17 (102%)  PFT 11/30/14 >> FEV1 0.89 (44%), FEV1% 64, TLC 4.42 (87%), RV 3.10 (130%), DLCO 22%  RAST 02/23/15 >> negative, IgE 4  A1AT 07/06/19 >> 137, MM  Chest Imaging:   HRCT chest  05/07/19 >> atherosclerosis, scarring at lung bases, numerous small nodules clustered in RML and LLL  Sleep Tests:   PSG 11/21/15 >> AHI 0.8, SpO2 low 90%  Cardiac Tests:   Echo 12/21/19 >> EF 60 to 65%, grade 2 DD, RVSP 44.7 mmHg, mod LA dilation, mod MR  Social History:  She  reports that she has never smoked. She has never used smokeless tobacco. She reports that she does not drink alcohol and does not use drugs.  Family History:  Her family history includes Breast cancer in her sister, sister and another family member; Heart disease in her brother and sister; Leukemia in her brother.     Assessment/Plan:   COPD exacerbation. - will give course of prednisone and doxycycyline - will arrange for home nebulizer with albuterol - advised her to call if her symptoms get worse  COPD mixed type. - continue trelegy and prn albuterol - high dose influenza vaccine today - discussed natural history of COPD  Nocturnal hypoxemia 2nd to COPD. - 2 liters oxygen at night  Persistent atrial fibrillation, chronic diastolic CHF, mitral regurgitation, HLD. - followed by Dr. Harl Bowie with [facility]   Time Spent Involved in Patient Care on Day of Examination:  32 minutes  Follow up:  Patient Instructions  Doxycycline antibiotic 100 mg pill twice per day for 7 days  Prednisone 10 mg pill >> 3 pills daily for 2 days, 2 pills daily for 2 days, 1 pill daily  for 2 days  Will arrange for home nebulizer machine  Albuterol one vial nebulized twice per day until your symptoms improve, then as needed  Follow up in 4 months   Medication List:   Allergies as of 04/05/2020      Reactions   Morphine And Related Nausea And Vomiting   Penicillins Rash   Sulfa Antibiotics Nausea And Vomiting      Medication List       Accurate as of April 05, 2020 12:28 PM. If you have any questions, ask your nurse or doctor.        STOP taking these medications   predniSONE 10 MG (21) Tbpk  tablet Commonly known as: STERAPRED UNI-PAK 21 TAB Replaced by: predniSONE 10 MG tablet Stopped by: Chesley Mires, MD     TAKE these medications   albuterol 108 (90 Base) MCG/ACT inhaler Commonly known as: VENTOLIN HFA Inhale 2 puffs into the lungs every 6 (six) hours as needed for wheezing or shortness of breath. What changed: Another medication with the same name was added. Make sure you understand how and when to take each. Changed by: Chesley Mires, MD   albuterol (2.5 MG/3ML) 0.083% nebulizer solution Commonly known as: PROVENTIL Take 3 mLs (2.5 mg total) by nebulization every 6 (six) hours as needed for wheezing or shortness of breath. What changed: You were already taking a medication with the same name, and this prescription was added. Make sure you understand how and when to take each. Changed by: Chesley Mires, MD   atorvastatin 20 MG tablet Commonly known as: LIPITOR Take 1 tablet by mouth daily.   calcium carbonate 1250 (500 Ca) MG tablet Commonly known as: OS-CAL - dosed in mg of elemental calcium Take 1 tablet by mouth daily with breakfast.   clonazePAM 1 MG tablet Commonly known as: KLONOPIN Take 1 tablet (1 mg total) by mouth at bedtime.   diltiazem 30 MG tablet Commonly known as: CARDIZEM Take by mouth.   doxycycline 100 MG tablet Commonly known as: VIBRA-TABS Take 1 tablet (100 mg total) by mouth 2 (two) times daily. Started by: Chesley Mires, MD   Eliquis 5 MG Tabs tablet Generic drug: apixaban Take 1 tablet by mouth twice daily   esomeprazole 20 MG capsule Commonly known as: NEXIUM Take 20 mg by mouth daily at 12 noon.   Flutter Devi 1 Device by Does not apply route in the morning, at noon, in the evening, and at bedtime.   gabapentin 100 MG capsule Commonly known as: NEURONTIN Take 2 capsules by mouth twice daily   guaiFENesin 100 MG/5ML Soln Commonly known as: ROBITUSSIN Take 5 mLs (100 mg total) by mouth every 4 (four) hours as needed for cough  or to loosen phlegm.   hydrochlorothiazide 12.5 MG tablet Commonly known as: HYDRODIURIL Take 1 tablet by mouth daily.   losartan 50 MG tablet Commonly known as: COZAAR Take 1 tablet by mouth daily.   metoprolol tartrate 25 MG tablet Commonly known as: LOPRESSOR Take 1 tablet (25 mg total) by mouth 2 (two) times daily.   multivitamin tablet Take 1 tablet by mouth daily.   OXYGEN Inhale 2 L into the lungs at bedtime.   predniSONE 10 MG tablet Commonly known as: DELTASONE Take 3 tablets (30 mg total) by mouth daily with breakfast for 2 days, THEN 2 tablets (20 mg total) daily with breakfast for 2 days, THEN 1 tablet (10 mg total) daily with breakfast for 2 days. Start taking on: April 05, 2020 Replaces: predniSONE 10 MG (21) Tbpk tablet Started by: Chesley Mires, MD   Trelegy Ellipta 100-62.5-25 MCG/INH Aepb Generic drug: Fluticasone-Umeclidin-Vilant Inhale 1 puff into the lungs daily.   triamcinolone 0.1 % Commonly known as: KENALOG Apply 1 application topically as needed.   venlafaxine 75 MG tablet Commonly known as: EFFEXOR Take 150 mg by mouth every morning. & 75 mg in the evening   vitamin B-12 100 MCG tablet Commonly known as: CYANOCOBALAMIN Take 200 mcg by mouth daily.       Signature:  Chesley Mires, MD [place] Pager - [phone] 04/05/2020, 12:28 PM

## 2020-04-06 DIAGNOSIS — J449 Chronic obstructive pulmonary disease, unspecified: Secondary | ICD-10-CM | POA: Diagnosis not present

## 2020-04-07 DIAGNOSIS — Z7401 Bed confinement status: Secondary | ICD-10-CM | POA: Diagnosis not present

## 2020-04-07 DIAGNOSIS — J449 Chronic obstructive pulmonary disease, unspecified: Secondary | ICD-10-CM | POA: Diagnosis not present

## 2020-04-07 DIAGNOSIS — K219 Gastro-esophageal reflux disease without esophagitis: Secondary | ICD-10-CM | POA: Diagnosis not present

## 2020-04-07 DIAGNOSIS — M818 Other osteoporosis without current pathological fracture: Secondary | ICD-10-CM | POA: Diagnosis not present

## 2020-04-07 DIAGNOSIS — I739 Peripheral vascular disease, unspecified: Secondary | ICD-10-CM | POA: Diagnosis not present

## 2020-04-07 DIAGNOSIS — I482 Chronic atrial fibrillation, unspecified: Secondary | ICD-10-CM | POA: Diagnosis not present

## 2020-04-07 DIAGNOSIS — E782 Mixed hyperlipidemia: Secondary | ICD-10-CM | POA: Diagnosis not present

## 2020-04-07 DIAGNOSIS — R69 Illness, unspecified: Secondary | ICD-10-CM | POA: Diagnosis not present

## 2020-04-07 DIAGNOSIS — I10 Essential (primary) hypertension: Secondary | ICD-10-CM | POA: Diagnosis not present

## 2020-04-08 DIAGNOSIS — I10 Essential (primary) hypertension: Secondary | ICD-10-CM | POA: Diagnosis not present

## 2020-04-08 DIAGNOSIS — R69 Illness, unspecified: Secondary | ICD-10-CM | POA: Diagnosis not present

## 2020-04-08 DIAGNOSIS — J449 Chronic obstructive pulmonary disease, unspecified: Secondary | ICD-10-CM | POA: Diagnosis not present

## 2020-04-08 DIAGNOSIS — E785 Hyperlipidemia, unspecified: Secondary | ICD-10-CM | POA: Diagnosis not present

## 2020-04-10 DIAGNOSIS — R0600 Dyspnea, unspecified: Secondary | ICD-10-CM | POA: Diagnosis not present

## 2020-04-10 DIAGNOSIS — J9811 Atelectasis: Secondary | ICD-10-CM | POA: Diagnosis not present

## 2020-04-10 DIAGNOSIS — U071 COVID-19: Secondary | ICD-10-CM | POA: Diagnosis not present

## 2020-04-10 DIAGNOSIS — M818 Other osteoporosis without current pathological fracture: Secondary | ICD-10-CM | POA: Diagnosis not present

## 2020-04-10 DIAGNOSIS — I10 Essential (primary) hypertension: Secondary | ICD-10-CM | POA: Diagnosis not present

## 2020-04-10 DIAGNOSIS — E785 Hyperlipidemia, unspecified: Secondary | ICD-10-CM | POA: Diagnosis not present

## 2020-04-10 DIAGNOSIS — R911 Solitary pulmonary nodule: Secondary | ICD-10-CM | POA: Diagnosis not present

## 2020-04-10 DIAGNOSIS — I700 Atherosclerosis of aorta: Secondary | ICD-10-CM | POA: Diagnosis not present

## 2020-04-10 DIAGNOSIS — R9431 Abnormal electrocardiogram [ECG] [EKG]: Secondary | ICD-10-CM | POA: Diagnosis not present

## 2020-04-10 DIAGNOSIS — R69 Illness, unspecified: Secondary | ICD-10-CM | POA: Diagnosis not present

## 2020-04-10 DIAGNOSIS — J441 Chronic obstructive pulmonary disease with (acute) exacerbation: Secondary | ICD-10-CM | POA: Diagnosis not present

## 2020-04-10 DIAGNOSIS — Z9981 Dependence on supplemental oxygen: Secondary | ICD-10-CM | POA: Diagnosis not present

## 2020-04-10 DIAGNOSIS — R0902 Hypoxemia: Secondary | ICD-10-CM | POA: Diagnosis not present

## 2020-04-10 DIAGNOSIS — E782 Mixed hyperlipidemia: Secondary | ICD-10-CM | POA: Diagnosis not present

## 2020-04-10 DIAGNOSIS — Z7401 Bed confinement status: Secondary | ICD-10-CM | POA: Diagnosis not present

## 2020-04-10 DIAGNOSIS — R0602 Shortness of breath: Secondary | ICD-10-CM | POA: Diagnosis not present

## 2020-04-10 DIAGNOSIS — I482 Chronic atrial fibrillation, unspecified: Secondary | ICD-10-CM | POA: Diagnosis not present

## 2020-04-10 DIAGNOSIS — I4891 Unspecified atrial fibrillation: Secondary | ICD-10-CM | POA: Diagnosis not present

## 2020-04-10 DIAGNOSIS — J45901 Unspecified asthma with (acute) exacerbation: Secondary | ICD-10-CM | POA: Diagnosis not present

## 2020-04-10 DIAGNOSIS — J449 Chronic obstructive pulmonary disease, unspecified: Secondary | ICD-10-CM | POA: Diagnosis not present

## 2020-04-10 DIAGNOSIS — I739 Peripheral vascular disease, unspecified: Secondary | ICD-10-CM | POA: Diagnosis not present

## 2020-04-10 DIAGNOSIS — R069 Unspecified abnormalities of breathing: Secondary | ICD-10-CM | POA: Diagnosis not present

## 2020-04-10 DIAGNOSIS — K219 Gastro-esophageal reflux disease without esophagitis: Secondary | ICD-10-CM | POA: Diagnosis not present

## 2020-04-11 DIAGNOSIS — I482 Chronic atrial fibrillation, unspecified: Secondary | ICD-10-CM | POA: Diagnosis not present

## 2020-04-11 DIAGNOSIS — E782 Mixed hyperlipidemia: Secondary | ICD-10-CM | POA: Diagnosis not present

## 2020-04-11 DIAGNOSIS — R69 Illness, unspecified: Secondary | ICD-10-CM | POA: Diagnosis not present

## 2020-04-11 DIAGNOSIS — J449 Chronic obstructive pulmonary disease, unspecified: Secondary | ICD-10-CM | POA: Diagnosis not present

## 2020-04-11 DIAGNOSIS — I739 Peripheral vascular disease, unspecified: Secondary | ICD-10-CM | POA: Diagnosis not present

## 2020-04-11 DIAGNOSIS — I10 Essential (primary) hypertension: Secondary | ICD-10-CM | POA: Diagnosis not present

## 2020-04-11 DIAGNOSIS — Z7401 Bed confinement status: Secondary | ICD-10-CM | POA: Diagnosis not present

## 2020-04-11 DIAGNOSIS — K219 Gastro-esophageal reflux disease without esophagitis: Secondary | ICD-10-CM | POA: Diagnosis not present

## 2020-04-11 DIAGNOSIS — M818 Other osteoporosis without current pathological fracture: Secondary | ICD-10-CM | POA: Diagnosis not present

## 2020-04-12 DIAGNOSIS — Z7401 Bed confinement status: Secondary | ICD-10-CM | POA: Diagnosis not present

## 2020-04-12 DIAGNOSIS — M818 Other osteoporosis without current pathological fracture: Secondary | ICD-10-CM | POA: Diagnosis not present

## 2020-04-12 DIAGNOSIS — I482 Chronic atrial fibrillation, unspecified: Secondary | ICD-10-CM | POA: Diagnosis not present

## 2020-04-12 DIAGNOSIS — R69 Illness, unspecified: Secondary | ICD-10-CM | POA: Diagnosis not present

## 2020-04-12 DIAGNOSIS — I739 Peripheral vascular disease, unspecified: Secondary | ICD-10-CM | POA: Diagnosis not present

## 2020-04-12 DIAGNOSIS — K219 Gastro-esophageal reflux disease without esophagitis: Secondary | ICD-10-CM | POA: Diagnosis not present

## 2020-04-12 DIAGNOSIS — E782 Mixed hyperlipidemia: Secondary | ICD-10-CM | POA: Diagnosis not present

## 2020-04-12 DIAGNOSIS — J449 Chronic obstructive pulmonary disease, unspecified: Secondary | ICD-10-CM | POA: Diagnosis not present

## 2020-04-12 DIAGNOSIS — I10 Essential (primary) hypertension: Secondary | ICD-10-CM | POA: Diagnosis not present

## 2020-04-18 DIAGNOSIS — R69 Illness, unspecified: Secondary | ICD-10-CM | POA: Diagnosis not present

## 2020-04-18 DIAGNOSIS — Z7401 Bed confinement status: Secondary | ICD-10-CM | POA: Diagnosis not present

## 2020-04-18 DIAGNOSIS — E782 Mixed hyperlipidemia: Secondary | ICD-10-CM | POA: Diagnosis not present

## 2020-04-18 DIAGNOSIS — K219 Gastro-esophageal reflux disease without esophagitis: Secondary | ICD-10-CM | POA: Diagnosis not present

## 2020-04-18 DIAGNOSIS — J449 Chronic obstructive pulmonary disease, unspecified: Secondary | ICD-10-CM | POA: Diagnosis not present

## 2020-04-18 DIAGNOSIS — M818 Other osteoporosis without current pathological fracture: Secondary | ICD-10-CM | POA: Diagnosis not present

## 2020-04-18 DIAGNOSIS — I10 Essential (primary) hypertension: Secondary | ICD-10-CM | POA: Diagnosis not present

## 2020-04-18 DIAGNOSIS — I739 Peripheral vascular disease, unspecified: Secondary | ICD-10-CM | POA: Diagnosis not present

## 2020-04-18 DIAGNOSIS — I482 Chronic atrial fibrillation, unspecified: Secondary | ICD-10-CM | POA: Diagnosis not present

## 2020-04-21 DIAGNOSIS — J449 Chronic obstructive pulmonary disease, unspecified: Secondary | ICD-10-CM | POA: Diagnosis not present

## 2020-04-21 DIAGNOSIS — E782 Mixed hyperlipidemia: Secondary | ICD-10-CM | POA: Diagnosis not present

## 2020-04-21 DIAGNOSIS — I482 Chronic atrial fibrillation, unspecified: Secondary | ICD-10-CM | POA: Diagnosis not present

## 2020-04-21 DIAGNOSIS — Z7401 Bed confinement status: Secondary | ICD-10-CM | POA: Diagnosis not present

## 2020-04-21 DIAGNOSIS — I739 Peripheral vascular disease, unspecified: Secondary | ICD-10-CM | POA: Diagnosis not present

## 2020-04-21 DIAGNOSIS — I10 Essential (primary) hypertension: Secondary | ICD-10-CM | POA: Diagnosis not present

## 2020-04-21 DIAGNOSIS — M818 Other osteoporosis without current pathological fracture: Secondary | ICD-10-CM | POA: Diagnosis not present

## 2020-04-21 DIAGNOSIS — K219 Gastro-esophageal reflux disease without esophagitis: Secondary | ICD-10-CM | POA: Diagnosis not present

## 2020-04-21 DIAGNOSIS — R69 Illness, unspecified: Secondary | ICD-10-CM | POA: Diagnosis not present

## 2020-04-24 DIAGNOSIS — Z7401 Bed confinement status: Secondary | ICD-10-CM | POA: Diagnosis not present

## 2020-04-24 DIAGNOSIS — J449 Chronic obstructive pulmonary disease, unspecified: Secondary | ICD-10-CM | POA: Diagnosis not present

## 2020-04-24 DIAGNOSIS — R69 Illness, unspecified: Secondary | ICD-10-CM | POA: Diagnosis not present

## 2020-04-24 DIAGNOSIS — M818 Other osteoporosis without current pathological fracture: Secondary | ICD-10-CM | POA: Diagnosis not present

## 2020-04-24 DIAGNOSIS — I739 Peripheral vascular disease, unspecified: Secondary | ICD-10-CM | POA: Diagnosis not present

## 2020-04-24 DIAGNOSIS — K219 Gastro-esophageal reflux disease without esophagitis: Secondary | ICD-10-CM | POA: Diagnosis not present

## 2020-04-24 DIAGNOSIS — I10 Essential (primary) hypertension: Secondary | ICD-10-CM | POA: Diagnosis not present

## 2020-04-24 DIAGNOSIS — E782 Mixed hyperlipidemia: Secondary | ICD-10-CM | POA: Diagnosis not present

## 2020-04-24 DIAGNOSIS — I482 Chronic atrial fibrillation, unspecified: Secondary | ICD-10-CM | POA: Diagnosis not present

## 2020-04-25 DIAGNOSIS — J449 Chronic obstructive pulmonary disease, unspecified: Secondary | ICD-10-CM | POA: Diagnosis not present

## 2020-04-25 DIAGNOSIS — R0602 Shortness of breath: Secondary | ICD-10-CM | POA: Diagnosis not present

## 2020-04-25 DIAGNOSIS — R600 Localized edema: Secondary | ICD-10-CM | POA: Diagnosis not present

## 2020-05-02 ENCOUNTER — Telehealth: Payer: Self-pay | Admitting: Cardiology

## 2020-05-02 ENCOUNTER — Ambulatory Visit: Payer: Medicare HMO | Admitting: Cardiology

## 2020-05-02 NOTE — Telephone Encounter (Signed)
Patient called stating that she was seen by Dr. [name] (zack) several weeks ago. He put her on a new medication to control her palpitations. She has been scheduled for 05/05/2020 to see Levell July, NP.

## 2020-05-02 NOTE — Telephone Encounter (Signed)
Spoke to patient she stated she thought she had appointment today.Advised her appointment is Fri 2/25 at 8:00 am with with [facility] office.

## 2020-05-03 ENCOUNTER — Other Ambulatory Visit: Payer: Self-pay | Admitting: Primary Care

## 2020-05-04 NOTE — Progress Notes (Signed)
Cardiology Office Note [place] Date: 05/05/2020   ID: MALAN WERK, DOB 26-Jan-1934, MRN [id number]  PCP:  Celene Squibb, MD  Cardiologist:  No primary care provider on file. Electrophysiologist:  None   Chief Complaint:  f/u persistent atrial fibrillation  History of Present Illness: Amanda Palmer is a 87 y.o. female with a history of atrial fibrillation, HTN, HLD, DOE, vocal cord dysfunction.    Last encounter with Dr Bronson Ing 04/29/2019. Having some exertional dyspnea, relieved with inhalers. No CP, leg swelling, dizziness, palpitations. Atrial fibrillation was stable.  Continuing metoprolol 25 mg twice daily, Eliquis 5 mg po bid,  CHADSVASC = 6. She was scheduled for high resolution CT chest. BP was mildly elevated. There were no changes to therapy. She was continuing statin therapy with Lipitor 20 mg po daily.  Previously saw Dr. Halford Chessman pulmonology.  An echocardiogram was ordered demonstrating moderate MR with EF 60 to 65%. On home O2 at night for nocturnal hypoxia.  She was to undergo a 6-minute walk test at follow-up with pulmonology to qualify for continuous home O2.   She has a history of persistent atrial fibrillation.  She had a previous fall at home with some bruising on her left arm, left shoulder and buttocks.  Denied any PND, orthopnea.  Had chronic cough..  She recently received an antibiotic for respiratory infection..  No active bleeding on Eliquis.  Blood pressure well controlled.  Recent presentation to [facility] ED 04/10/2020 with primary diagnosis of hypoxia, acute exacerbation of COPD with asthma/COVID-19.  She wears 3 L nasal cannula O2 at home.  EKG showed atrial fibrillation with RVR.  proBNP was 3392, potassium 1.8.  CT negative for PE.  Patchy multifocal groundglass opacities consistent with multifocal pneumonia correlate with COVID-19 testing.  Patient and family member are here today after seeing Dr. [name] PCP recently.  Dr. [name] placed the patient on Cardizem  30 mg daily due to rapid atrial fibrillation.  Also started patient on Lasix 20 mg daily for weight gain and lower extremity edema/shortness of breath.  Family member states her edema and shortness of breath along with her weight have significantly improved.  Heart rate is 100 on arrival today.  Weight today is 142..  Weight on 04/05/2020 148.  EKG demonstrated atrial fibrillation with RVR with a rate of 142 today.  Past Medical History:  Diagnosis Date  . A-fib ([place])   . Anxiety   . Cancer ([place])    breast  . COPD (chronic obstructive pulmonary disease) ([place])   . Depression   . Hypertension   . TIA (transient ischemic attack)    remote    Past Surgical History:  Procedure Laterality Date  . ABDOMINAL HYSTERECTOMY    . APPENDECTOMY    . BACK SURGERY     total of five surgeries  . BREAST BIOPSY Left    benign  . BREAST CAPSULECTOMY WITH IMPLANT EXCHANGE Right 05/27/2016   Procedure: REMOVAL AND REPLACEMENT OF RIGHT BREAST IMPLANT AND BREAST CAPSULECTOMY;  Surgeon: Cristine Polio, MD;  Location: [place];  Service: Plastics;  Laterality: Right;  . CARDIAC CATHETERIZATION    . CHOLECYSTECTOMY    . COLONOSCOPY  08/2014   Dr. Britta Mccreedy: Small sessile polyp at the cecum, removed, tubular adenoma  . ESOPHAGOGASTRODUODENOSCOPY  06/2013   Dr. Britta Mccreedy: Hiatal hernia, Schatzki ring, nonobstructive.  Marland Kitchen MASTECTOMY     right   . ORIF ANKLE FRACTURE Left 06/01/2014   Procedure: OPEN REDUCTION INTERNAL  FIXATION (ORIF) LEFT ANKLE FRACTURE;  Surgeon: Marybelle Killings, MD;  Location: [place];  Service: Orthopedics;  Laterality: Left;  . ROTATOR CUFF REPAIR     left  . TOTAL HIP ARTHROPLASTY     right  . TOTAL KNEE ARTHROPLASTY     bilateral    Current Outpatient Medications  Medication Sig Dispense Refill  . albuterol (PROVENTIL HFA;VENTOLIN HFA) 108 (90 Base) MCG/ACT inhaler Inhale 2 puffs into the lungs every 6 (six) hours as needed for wheezing or shortness of breath.    Marland Kitchen  albuterol (PROVENTIL) (2.5 MG/3ML) 0.083% nebulizer solution Take 3 mLs (2.5 mg total) by nebulization every 6 (six) hours as needed for wheezing or shortness of breath. 360 mL 5  . atorvastatin (LIPITOR) 20 MG tablet Take 1 tablet by mouth daily.    . calcium carbonate (OS-CAL - DOSED IN MG OF ELEMENTAL CALCIUM) 1250 (500 Ca) MG tablet Take 1 tablet by mouth daily with breakfast.    . clonazePAM (KLONOPIN) 1 MG tablet Take 1 tablet (1 mg total) by mouth at bedtime. 30 tablet 0  . diltiazem (CARDIZEM CD) 120 MG 24 hr capsule Take 1 capsule (120 mg total) by mouth daily. (long acting) 30 capsule 6  . diltiazem (CARDIZEM) 30 MG tablet Take 30 mg by mouth every 8 (eight) hours as needed (for heartrate greater than 100).    Marland Kitchen ELIQUIS 5 MG TABS tablet Take 1 tablet by mouth twice daily 60 tablet 6  . esomeprazole (NEXIUM) 20 MG capsule Take 20 mg by mouth daily at 12 noon.    . furosemide (LASIX) 20 MG tablet Take 20 mg by mouth.    Marland Kitchen guaiFENesin (ROBITUSSIN) 100 MG/5ML SOLN Take 5 mLs (100 mg total) by mouth every 4 (four) hours as needed for cough or to loosen phlegm. 236 mL 0  . hydrochlorothiazide (HYDRODIURIL) 12.5 MG tablet Take 1 tablet by mouth daily.    Marland Kitchen losartan (COZAAR) 50 MG tablet Take 1 tablet by mouth daily.    . metoprolol tartrate (LOPRESSOR) 25 MG tablet Take 1 tablet (25 mg total) by mouth 2 (two) times daily. 180 tablet 3  . Multiple Vitamin (MULTIVITAMIN) tablet Take 1 tablet by mouth daily.    . OXYGEN Inhale 2 L into the lungs at bedtime.    Marland Kitchen Respiratory Therapy Supplies (FLUTTER) DEVI 1 Device by Does not apply route in the morning, at noon, in the evening, and at bedtime. 1 each 0  . TRELEGY ELLIPTA 100-62.5-25 MCG/INH AEPB INHALE ONE PUFF into THE lungs DAILY 60 each 5  . triamcinolone cream (KENALOG) 0.1 % Apply 1 application topically as needed.    . venlafaxine (EFFEXOR) 75 MG tablet Take 150 mg by mouth every morning. & 75 mg in the evening    . vitamin B-12  (CYANOCOBALAMIN) 100 MCG tablet Take 200 mcg by mouth daily.      No current facility-administered medications for this visit.   Allergies:  Morphine and related, Penicillins, and Sulfa antibiotics   Social History: The patient  reports that she has never smoked. She has never used smokeless tobacco. She reports that she does not drink alcohol and does not use drugs.   Family History: The patient's family history includes Breast cancer in her sister, sister and another family member; Heart disease in her brother and sister; Leukemia in her brother.   ROS:  Please see the history of present illness. Otherwise, complete review of systems is positive for none.  All  other systems are reviewed and negative.   Physical Exam: VS:  BP 118/68   Pulse 100   Ht 5\' 4"  (1.626 m)   Wt 142 lb 12.8 oz (64.8 kg)   SpO2 (!) 84% Comment: without her oxygen today  BMI 24.51 kg/m , BMI Body mass index is 24.51 kg/m.  Wt Readings from Last 3 Encounters:  05/05/20 142 lb 12.8 oz (64.8 kg)  04/05/20 148 lb 6.4 oz (67.3 kg)  02/15/20 139 lb (63 kg)    General: Patient appears comfortable at rest. Neck: Supple, no elevated JVP or carotid bruits, no thyromegaly. Lungs: Faint Rales in posterior left lung base, in no acute distress.  Nonlabored breathing at rest. Cardiac: Irregularly irregular tachycardia rate and rhythm, no S3 or significant systolic murmur, no pericardial rub. Extremities: Mild pitting edema bilaterally, distal pulses 2+. Skin: Warm and dry. Musculoskeletal: No kyphosis. Neuropsychiatric: Alert and oriented x3, affect grossly appropriate.  ECG:  An ECG dated 05/05/2020 was personally reviewed today and demonstrated:  Atrial fibrillation with RVR rate of 142.  Recent Labwork: No results found for requested labs within last 8760 hours.  No results found for: CHOL, TRIG, HDL, CHOLHDL, VLDL, LDLCALC, LDLDIRECT  Other Studies Reviewed Today:  Echocardiogram 12/21/2019 1. Left ventricular  ejection fraction, by estimation, is 60 to 65%. The left ventricle has normal function. The left ventricle has no regional wall motion abnormalities. Left ventricular diastolic parameters are consistent with Grade II diastolic dysfunction (pseudonormalization). 2. Right ventricular systolic function is normal. The right ventricular size is normal. There is mildly to moderately elevated pulmonary artery systolic pressure. The estimated right ventricular systolic pressure is 70.9 mmHg. 3. Left atrial size was moderately dilated. 4. The mitral valve is abnormal. Moderate mitral valve regurgitation. 5. The aortic valve is tricuspid. Aortic valve regurgitation is not visualized. Mild aortic valve sclerosis is present, with no evidence of aortic valve stenosis. 6. The inferior vena cava is normal in size with greater than 50% respiratory variability, suggesting right atrial pressure of 3 mmHg   Assessment and Plan:   1. Persistent atrial fibrillation (HCC) EKG today shows atrial fibrillation with RVR at rate of 142.  Continue metoprolol 25 mg p.o. twice daily.  Continue Eliquis 5 mg p.o. twice daily.  Start Cardizem CD 120 mg p.o. daily.  Take Cardizem 30 mg daily as needed for heart rate greater than 100.  Refer to atrial fibrillation clinic for evaluation and management  2. Essential hypertension Blood pressure well controlled with blood pressure 118/68.  Continue HCTZ 12.5 mg daily, losartan 50 mg p.o. daily.  3. Mixed hyperlipidemia Continue atorvastatin 20 mg p.o. daily.  4. Shortness of breath/COPD/pulmonary nodules Recently noted to be short of breath at PCP office with rapid heart rate.  PCP started her on Cardizem 30 mg p.o. daily as needed for heart rate greater than 100, and Lasix 20 mg p.o. daily.  Today she denies any current shortness of breath.  Family member states shortness of breath and edema have improved.  Weight has decreased by 6 pounds.  Most recent echo 12/21/2019  demonstrated: EF 60 to 65%.  G2 DD, moderately elevated PASP estimated at 44.7 mmHg.  LA moderately dilated.  Moderate MR.  5.  Edema Patient and family members state recently saw PCP who started her on Cardizem 30 mg p.o. daily and Lasix 20 mg p.o. daily for rapid heart rate and lower extremity edema, shortness of breath, and weight gain.  Continues with some mild pitting lower  extremity edema.  She has lost approximately 6 pounds since January 26 based on current weights in epic.  Continue Lasix 20 mg daily.  Medication Adjustments/Labs and Tests Ordered: Current medicines are reviewed at length with the patient today.  Concerns regarding medicines are outlined above.   Disposition: Follow-up with Dr. Harl Bowie or APP 1 month Signed, Levell July, NP 05/05/2020 8:52 AM    [facility] [place], [place], [place] [address] Phone: [phone]; Fax: [phone]

## 2020-05-05 ENCOUNTER — Ambulatory Visit (INDEPENDENT_AMBULATORY_CARE_PROVIDER_SITE_OTHER): Payer: Medicare HMO | Admitting: Family Medicine

## 2020-05-05 ENCOUNTER — Encounter: Payer: Self-pay | Admitting: Family Medicine

## 2020-05-05 ENCOUNTER — Other Ambulatory Visit: Payer: Self-pay

## 2020-05-05 VITALS — BP 118/68 | HR 100 | Ht 64.0 in | Wt 142.8 lb

## 2020-05-05 DIAGNOSIS — R000 Tachycardia, unspecified: Secondary | ICD-10-CM

## 2020-05-05 DIAGNOSIS — E782 Mixed hyperlipidemia: Secondary | ICD-10-CM | POA: Diagnosis not present

## 2020-05-05 DIAGNOSIS — I739 Peripheral vascular disease, unspecified: Secondary | ICD-10-CM | POA: Diagnosis not present

## 2020-05-05 DIAGNOSIS — I10 Essential (primary) hypertension: Secondary | ICD-10-CM | POA: Diagnosis not present

## 2020-05-05 DIAGNOSIS — I4819 Other persistent atrial fibrillation: Secondary | ICD-10-CM

## 2020-05-05 DIAGNOSIS — K219 Gastro-esophageal reflux disease without esophagitis: Secondary | ICD-10-CM | POA: Diagnosis not present

## 2020-05-05 DIAGNOSIS — J42 Unspecified chronic bronchitis: Secondary | ICD-10-CM | POA: Diagnosis not present

## 2020-05-05 DIAGNOSIS — I482 Chronic atrial fibrillation, unspecified: Secondary | ICD-10-CM | POA: Diagnosis not present

## 2020-05-05 DIAGNOSIS — M818 Other osteoporosis without current pathological fracture: Secondary | ICD-10-CM | POA: Diagnosis not present

## 2020-05-05 DIAGNOSIS — Z7401 Bed confinement status: Secondary | ICD-10-CM | POA: Diagnosis not present

## 2020-05-05 DIAGNOSIS — R69 Illness, unspecified: Secondary | ICD-10-CM | POA: Diagnosis not present

## 2020-05-05 DIAGNOSIS — J449 Chronic obstructive pulmonary disease, unspecified: Secondary | ICD-10-CM | POA: Diagnosis not present

## 2020-05-05 MED ORDER — DILTIAZEM HCL ER COATED BEADS 120 MG PO CP24: 120.0000 mg | ORAL_CAPSULE | Freq: Every day | ORAL | 6 refills | Status: DC

## 2020-05-05 MED ORDER — DILTIAZEM HCL 30 MG PO TABS: 30.0000 mg | ORAL_TABLET | Freq: Three times a day (TID) | ORAL | 2 refills | Status: DC | PRN

## 2020-05-05 NOTE — Patient Instructions (Addendum)
Medication Instructions:   Continue the Diltiazem (short acting) 30mg  as needed for heart rate greater than 100.  Begin Cardizem CD 130mg  daily (long acting) - will send new prescription to [facility] now.   Continue all other medications.    Labwork: none  Testing/Procedures: none  Follow-Up: 1 month   Any Other Special Instructions Will Be Listed Below (If Applicable). You have been referred to:  Afib clinic   If you need a refill on your cardiac medications before your next appointment, please call your pharmacy.

## 2020-05-05 NOTE — Addendum Note (Signed)
Addended by: Laurine Blazer on: 05/05/2020 09:03 AM   Modules accepted: Orders

## 2020-05-07 DIAGNOSIS — J449 Chronic obstructive pulmonary disease, unspecified: Secondary | ICD-10-CM | POA: Diagnosis not present

## 2020-05-08 DIAGNOSIS — E782 Mixed hyperlipidemia: Secondary | ICD-10-CM | POA: Diagnosis not present

## 2020-05-08 DIAGNOSIS — J449 Chronic obstructive pulmonary disease, unspecified: Secondary | ICD-10-CM | POA: Diagnosis not present

## 2020-05-08 DIAGNOSIS — I10 Essential (primary) hypertension: Secondary | ICD-10-CM | POA: Diagnosis not present

## 2020-05-08 DIAGNOSIS — R0602 Shortness of breath: Secondary | ICD-10-CM | POA: Diagnosis not present

## 2020-05-08 DIAGNOSIS — R69 Illness, unspecified: Secondary | ICD-10-CM | POA: Diagnosis not present

## 2020-05-08 DIAGNOSIS — K219 Gastro-esophageal reflux disease without esophagitis: Secondary | ICD-10-CM | POA: Diagnosis not present

## 2020-05-08 DIAGNOSIS — I739 Peripheral vascular disease, unspecified: Secondary | ICD-10-CM | POA: Diagnosis not present

## 2020-05-08 DIAGNOSIS — I482 Chronic atrial fibrillation, unspecified: Secondary | ICD-10-CM | POA: Diagnosis not present

## 2020-05-08 DIAGNOSIS — M818 Other osteoporosis without current pathological fracture: Secondary | ICD-10-CM | POA: Diagnosis not present

## 2020-05-08 DIAGNOSIS — E785 Hyperlipidemia, unspecified: Secondary | ICD-10-CM | POA: Diagnosis not present

## 2020-05-09 ENCOUNTER — Other Ambulatory Visit: Payer: Self-pay

## 2020-05-09 ENCOUNTER — Other Ambulatory Visit (HOSPITAL_COMMUNITY): Payer: Self-pay | Admitting: *Deleted

## 2020-05-09 ENCOUNTER — Ambulatory Visit (HOSPITAL_COMMUNITY)
Admission: RE | Admit: 2020-05-09 | Discharge: 2020-05-09 | Disposition: A | Discharge status: Home / Self Care | Payer: Medicare HMO | Source: Ambulatory Visit | Attending: Physician Assistant | Admitting: Physician Assistant

## 2020-05-09 ENCOUNTER — Encounter (HOSPITAL_COMMUNITY): Payer: Self-pay | Admitting: Physician Assistant

## 2020-05-09 VITALS — BP 112/80 | HR 152 | Ht 64.0 in | Wt 140.4 lb

## 2020-05-09 DIAGNOSIS — Z7901 Long term (current) use of anticoagulants: Secondary | ICD-10-CM | POA: Insufficient documentation

## 2020-05-09 DIAGNOSIS — I4821 Permanent atrial fibrillation: Secondary | ICD-10-CM | POA: Insufficient documentation

## 2020-05-09 DIAGNOSIS — E876 Hypokalemia: Secondary | ICD-10-CM | POA: Diagnosis not present

## 2020-05-09 DIAGNOSIS — Z0100 Encounter for examination of eyes and vision without abnormal findings: Secondary | ICD-10-CM | POA: Diagnosis not present

## 2020-05-09 DIAGNOSIS — E785 Hyperlipidemia, unspecified: Secondary | ICD-10-CM | POA: Insufficient documentation

## 2020-05-09 DIAGNOSIS — Z9011 Acquired absence of right breast and nipple: Secondary | ICD-10-CM | POA: Insufficient documentation

## 2020-05-09 DIAGNOSIS — Z79899 Other long term (current) drug therapy: Secondary | ICD-10-CM | POA: Diagnosis not present

## 2020-05-09 DIAGNOSIS — Z961 Presence of intraocular lens: Secondary | ICD-10-CM | POA: Diagnosis not present

## 2020-05-09 DIAGNOSIS — I2510 Atherosclerotic heart disease of native coronary artery without angina pectoris: Secondary | ICD-10-CM | POA: Diagnosis not present

## 2020-05-09 DIAGNOSIS — I10 Essential (primary) hypertension: Secondary | ICD-10-CM | POA: Diagnosis not present

## 2020-05-09 DIAGNOSIS — I482 Chronic atrial fibrillation, unspecified: Secondary | ICD-10-CM | POA: Insufficient documentation

## 2020-05-09 DIAGNOSIS — J449 Chronic obstructive pulmonary disease, unspecified: Secondary | ICD-10-CM | POA: Insufficient documentation

## 2020-05-09 DIAGNOSIS — I4819 Other persistent atrial fibrillation: Secondary | ICD-10-CM | POA: Insufficient documentation

## 2020-05-09 DIAGNOSIS — H52203 Unspecified astigmatism, bilateral: Secondary | ICD-10-CM | POA: Diagnosis not present

## 2020-05-09 DIAGNOSIS — Z8616 Personal history of COVID-19: Secondary | ICD-10-CM | POA: Diagnosis not present

## 2020-05-09 DIAGNOSIS — Z96653 Presence of artificial knee joint, bilateral: Secondary | ICD-10-CM | POA: Insufficient documentation

## 2020-05-09 DIAGNOSIS — Z9981 Dependence on supplemental oxygen: Secondary | ICD-10-CM | POA: Diagnosis not present

## 2020-05-09 DIAGNOSIS — D6869 Other thrombophilia: Secondary | ICD-10-CM | POA: Diagnosis not present

## 2020-05-09 DIAGNOSIS — H524 Presbyopia: Secondary | ICD-10-CM | POA: Diagnosis not present

## 2020-05-09 DIAGNOSIS — H5213 Myopia, bilateral: Secondary | ICD-10-CM | POA: Diagnosis not present

## 2020-05-09 DIAGNOSIS — Z7951 Long term (current) use of inhaled steroids: Secondary | ICD-10-CM | POA: Insufficient documentation

## 2020-05-09 DIAGNOSIS — H40013 Open angle with borderline findings, low risk, bilateral: Secondary | ICD-10-CM | POA: Diagnosis not present

## 2020-05-09 DIAGNOSIS — Z8673 Personal history of transient ischemic attack (TIA), and cerebral infarction without residual deficits: Secondary | ICD-10-CM | POA: Insufficient documentation

## 2020-05-09 LAB — CBC
HCT: 29.1 % — ABNORMAL LOW (ref 36.0–46.0)
Hemoglobin: 9.2 g/dL — ABNORMAL LOW (ref 12.0–15.0)
MCH: 29.6 pg (ref 26.0–34.0)
MCHC: 31.6 g/dL (ref 30.0–36.0)
MCV: 93.6 fL (ref 80.0–100.0)
Platelets: 274 10*3/uL (ref 150–400)
RBC: 3.11 MIL/uL — ABNORMAL LOW (ref 3.87–5.11)
RDW: 17.3 % — ABNORMAL HIGH (ref 11.5–15.5)
WBC: 3.8 10*3/uL — ABNORMAL LOW (ref 4.0–10.5)
nRBC: 0 % (ref 0.0–0.2)

## 2020-05-09 LAB — BASIC METABOLIC PANEL
Anion gap: 12 (ref 5–15)
BUN: 15 mg/dL (ref 8–23)
CO2: 33 mmol/L — ABNORMAL HIGH (ref 22–32)
Calcium: 9.5 mg/dL (ref 8.9–10.3)
Chloride: 91 mmol/L — ABNORMAL LOW (ref 98–111)
Creatinine, Ser: 1.07 mg/dL — ABNORMAL HIGH (ref 0.44–1.00)
GFR, Estimated: 51 mL/min — ABNORMAL LOW (ref >60)
Glucose, Bld: 115 mg/dL — ABNORMAL HIGH (ref 70–99)
Potassium: 2.5 mmol/L — CL (ref 3.5–5.1)
Sodium: 136 mmol/L (ref 135–145)

## 2020-05-09 MED ORDER — POTASSIUM CHLORIDE CRYS ER 20 MEQ PO TBCR: EXTENDED_RELEASE_TABLET | ORAL | 2 refills | Status: DC

## 2020-05-09 NOTE — Addendum Note (Signed)
Encounter addended by: Juluis Mire, RN on: 05/09/2020 2:12 PM  Actions taken: Pharmacy for encounter modified, Order list changed

## 2020-05-09 NOTE — H&P (View-Only) (Signed)
Primary Care Physician: Celene Squibb, MD Primary Cardiologist: Levell July Primary Electrophysiologist: none Referring Physician: Levell July   Amanda Palmer is a 87 y.o. female with a history of HTN, HLD, COPD, atrial fibrillation who presents for consultation in the [facility]. The patient was initially diagnosed with atrial fibrillation remotely. Patient is on Eliquis for a CHADS2VASC score of 6. She was seen at [facility] ED 04/10/20 with COVID pneumonia. She was also in rapid afib at that time. She was seen by Levell July on 05/05/20 and remained in rapid atrial fibrillation. Diltiazem was started for rate control. She remains in rapid afib. She has symptoms of palpitations and heart racing but overall her SOB has improved with resolution of PNA and diuretics. She did miss a dose of Eliquis 05/08/20.  Today, she denies symptoms of chest pain, orthopnea, PND, lower extremity edema, dizziness, presyncope, syncope, bleeding, or neurologic sequela. The patient is tolerating medications without difficulties and is otherwise without complaint today.    Atrial Fibrillation Risk Factors:  she does not have symptoms or diagnosis of sleep apnea. she does not have a history of rheumatic fever.   she has a BMI of Body mass index is 24.1 kg/m.Marland Kitchen Filed Weights   05/09/20 1142  Weight: 63.7 kg    Family History  Problem Relation Age of Onset  . Heart disease Brother   . Heart disease Sister   . Breast cancer Sister   . Leukemia Brother   . Breast cancer Sister   . Breast cancer Other        one deceased, one living  . Colon cancer Neg Hx      Atrial Fibrillation Management history:  Previous antiarrhythmic drugs: none Previous cardioversions: none Previous ablations: none CHADS2VASC score: 6 Anticoagulation history: Eliquis   Past Medical History:  Diagnosis Date  . A-fib ([place])   . Anxiety   . Cancer ([place])    breast  . COPD (chronic obstructive  pulmonary disease) ([place])   . Depression   . Hypertension   . TIA (transient ischemic attack)    remote   Past Surgical History:  Procedure Laterality Date  . ABDOMINAL HYSTERECTOMY    . APPENDECTOMY    . BACK SURGERY     total of five surgeries  . BREAST BIOPSY Left    benign  . BREAST CAPSULECTOMY WITH IMPLANT EXCHANGE Right 05/27/2016   Procedure: REMOVAL AND REPLACEMENT OF RIGHT BREAST IMPLANT AND BREAST CAPSULECTOMY;  Surgeon: Cristine Polio, MD;  Location: [place];  Service: Plastics;  Laterality: Right;  . CARDIAC CATHETERIZATION    . CHOLECYSTECTOMY    . COLONOSCOPY  08/2014   Dr. Britta Mccreedy: Small sessile polyp at the cecum, removed, tubular adenoma  . ESOPHAGOGASTRODUODENOSCOPY  06/2013   Dr. Britta Mccreedy: Hiatal hernia, Schatzki ring, nonobstructive.  Marland Kitchen MASTECTOMY     right   . ORIF ANKLE FRACTURE Left 06/01/2014   Procedure: OPEN REDUCTION INTERNAL FIXATION (ORIF) LEFT ANKLE FRACTURE;  Surgeon: Marybelle Killings, MD;  Location: [place];  Service: Orthopedics;  Laterality: Left;  . ROTATOR CUFF REPAIR     left  . TOTAL HIP ARTHROPLASTY     right  . TOTAL KNEE ARTHROPLASTY     bilateral    Current Outpatient Medications  Medication Sig Dispense Refill  . albuterol (PROVENTIL HFA;VENTOLIN HFA) 108 (90 Base) MCG/ACT inhaler Inhale 2 puffs into the lungs every 6 (six) hours as needed for wheezing or shortness of  breath.    Marland Kitchen albuterol (PROVENTIL) (2.5 MG/3ML) 0.083% nebulizer solution Take 3 mLs (2.5 mg total) by nebulization every 6 (six) hours as needed for wheezing or shortness of breath. 360 mL 5  . atorvastatin (LIPITOR) 20 MG tablet Take 1 tablet by mouth daily.    . calcium carbonate (OS-CAL - DOSED IN MG OF ELEMENTAL CALCIUM) 1250 (500 Ca) MG tablet Take 1 tablet by mouth daily with breakfast.    . clonazePAM (KLONOPIN) 1 MG tablet Take 1 tablet (1 mg total) by mouth at bedtime. 30 tablet 0  . diltiazem (CARDIZEM CD) 120 MG 24 hr capsule Take 1 capsule (120 mg  total) by mouth daily. (long acting) 30 capsule 6  . diltiazem (CARDIZEM) 30 MG tablet Take 1 tablet (30 mg total) by mouth every 8 (eight) hours as needed (for heartrate greater than 100). 30 tablet 2  . ELIQUIS 5 MG TABS tablet Take 1 tablet by mouth twice daily 60 tablet 6  . esomeprazole (NEXIUM) 20 MG capsule Take 20 mg by mouth daily at 12 noon.    . furosemide (LASIX) 20 MG tablet Take 20 mg by mouth.    Marland Kitchen guaiFENesin (ROBITUSSIN) 100 MG/5ML SOLN Take 5 mLs (100 mg total) by mouth every 4 (four) hours as needed for cough or to loosen phlegm. 236 mL 0  . hydrochlorothiazide (HYDRODIURIL) 12.5 MG tablet Take 1 tablet by mouth daily.    Marland Kitchen ibandronate (BONIVA) 150 MG tablet Take 150 mg by mouth every 30 (thirty) days.    Marland Kitchen losartan (COZAAR) 50 MG tablet Take 1 tablet by mouth daily.    . metoprolol tartrate (LOPRESSOR) 25 MG tablet Take 1 tablet (25 mg total) by mouth 2 (two) times daily. 180 tablet 3  . Multiple Vitamin (MULTIVITAMIN) tablet Take 1 tablet by mouth daily.    . ondansetron (ZOFRAN-ODT) 4 MG disintegrating tablet Take 4-8 mg by mouth every 8 (eight) hours as needed.    . OXYGEN Inhale 2 L into the lungs at bedtime.    Marland Kitchen Respiratory Therapy Supplies (FLUTTER) DEVI 1 Device by Does not apply route in the morning, at noon, in the evening, and at bedtime. 1 each 0  . TRELEGY ELLIPTA 100-62.5-25 MCG/INH AEPB INHALE ONE PUFF into THE lungs DAILY 60 each 5  . triamcinolone cream (KENALOG) 0.1 % Apply 1 application topically as needed.    . venlafaxine (EFFEXOR) 75 MG tablet Take 150 mg by mouth every morning. & 75 mg in the evening    . vitamin B-12 (CYANOCOBALAMIN) 100 MCG tablet Take 200 mcg by mouth daily.      No current facility-administered medications for this encounter.    Allergies  Allergen Reactions  . Morphine And Related Nausea And Vomiting  . Penicillins Rash  . Sulfa Antibiotics Nausea And Vomiting    Social History   Socioeconomic History  . Marital status:  Widowed    Spouse name: Not on file  . Number of children: Not on file  . Years of education: Not on file  . Highest education level: Not on file  Occupational History  . Not on file  Tobacco Use  . Smoking status: Never Smoker  . Smokeless tobacco: Never Used  Vaping Use  . Vaping Use: Never used  Substance and Sexual Activity  . Alcohol use: No    Alcohol/week: 0.0 standard drinks  . Drug use: No  . Sexual activity: Not on file  Other Topics Concern  . Not on file  Social History Narrative  . Not on file   Social Determinants of Health   Financial Resource Strain: Not on file  Food Insecurity: Not on file  Transportation Needs: Not on file  Physical Activity: Not on file  Stress: Not on file  Social Connections: Not on file  Intimate Partner Violence: Not on file     ROS- All systems are reviewed and negative except as per the HPI above.  Physical Exam: Vitals:   05/09/20 1142  BP: 112/80  Pulse: (!) 152  Weight: 63.7 kg  Height: 5\' 4"  (1.626 m)    GEN- The patient is a well appearing elderly female, alert and oriented x 3 today.   Head- normocephalic, atraumatic Eyes-  Sclera clear, conjunctiva pink Ears- hearing intact Oropharynx- clear Neck- supple  Lungs- Clear to ausculation bilaterally, diminished breath sounds. Heart- irregular rate and rhythm, tachycardia, no murmurs, rubs or gallops  GI- soft, NT, ND, + BS Extremities- no clubbing, cyanosis, or edema MS- no significant deformity or atrophy Skin- no rash or lesion Psych- euthymic mood, full affect Neuro- strength and sensation are intact  Wt Readings from Last 3 Encounters:  05/09/20 63.7 kg  05/05/20 64.8 kg  04/05/20 67.3 kg    EKG today demonstrates  Afib RVR Vent. rate 152 BPM QRS duration 80 ms QT/QTc 314/499 ms  Echo 12/30/19 demonstrated  1. Left ventricular ejection fraction, by estimation, is 60 to 65%. The  left ventricle has normal function. The left ventricle has no  regional  wall motion abnormalities. Left ventricular diastolic parameters are  consistent with Grade II diastolic  dysfunction (pseudonormalization).  2. Right ventricular systolic function is normal. The right ventricular  size is normal. There is mildly to moderately elevated pulmonary artery  systolic pressure. The estimated right ventricular systolic pressure is  73.4 mmHg.  3. Left atrial size was moderately dilated.  4. The mitral valve is abnormal. Moderate mitral valve regurgitation.  5. The aortic valve is tricuspid. Aortic valve regurgitation is not  visualized. Mild aortic valve sclerosis is present, with no evidence of  aortic valve stenosis.  6. The inferior vena cava is normal in size with greater than 50%  respiratory variability, suggesting right atrial pressure of 3 mmHg.   Epic records are reviewed at length today  CHA2DS2-VASc Score = 7  The patient's score is based upon: CHF History: No HTN History: Yes Diabetes History: No Stroke History: Yes Vascular Disease History: Yes Age Score: 2 Gender Score: 1      ASSESSMENT AND PLAN: 1. Persistent Atrial Fibrillation (ICD10:  I48.19) The patient's CHA2DS2-VASc score is 7, indicating a 11.2% annual risk of stroke.   Patient remains in rapid afib with symptoms of heart racing. We discussed therapeutic options today. Would avoid class 1C with CAD and would also avoid amiodarone with significant lung disease. Could consider dofetilide loading. Will arrange for TEE guided DCCV given her missed doses of anticoagulation. I worry that she would have clinical deterioration if we waited for 3 weeks.  Check bmet/CBC. She does not require a COVID test (positive test in Care Everywhere on 04/10/20) Continue Eliquis 5 mg BID Continue diltiazem 120 mg daily Continue Lopressor 25 mg BID  2. Secondary Hypercoagulable State (ICD10:  D68.69) The patient is at significant risk for stroke/thromboembolism based upon her  CHA2DS2-VASc Score of 7.  Continue Apixaban (Eliquis).   3. HTN Stable, no room to increase rate control at this time.  4. CAD Noted on chest CT.  5. Hypokalemia  1.8 at ED 04/10/20. Bmet as above.   Follow up in the AF clinic post DCCV.    [facility] [address] [place], [place] [address] [phone] 05/09/2020 12:10 PM

## 2020-05-09 NOTE — Patient Instructions (Signed)
Cardioversion scheduled for Wednesday, March 9th  - Arrive at the [facility] and go to admitting at [facility] not eat or drink anything after midnight the night prior to your procedure.  - Take all your morning medication (except diabetic medications) with a sip of water prior to arrival.  - You will not be able to drive home after your procedure.  - Do NOT miss any doses of your blood thinner - if you should miss a dose please notify our office immediately.  - If you feel as if you go back into normal rhythm prior to scheduled cardioversion, please notify our office immediately. If your procedure is canceled in the cardioversion suite you will be charged a cancellation fee.

## 2020-05-09 NOTE — Progress Notes (Signed)
Primary Care Physician: Celene Squibb, MD Primary Cardiologist: Levell July Primary Electrophysiologist: none Referring Physician: Levell July   Amanda Palmer is a 87 y.o. female with a history of HTN, HLD, COPD, atrial fibrillation who presents for consultation in the [facility]. The patient was initially diagnosed with atrial fibrillation remotely. Patient is on Eliquis for a CHADS2VASC score of 6. She was seen at [facility] ED 04/10/20 with COVID pneumonia. She was also in rapid afib at that time. She was seen by Levell July on 05/05/20 and remained in rapid atrial fibrillation. Diltiazem was started for rate control. She remains in rapid afib. She has symptoms of palpitations and heart racing but overall her SOB has improved with resolution of PNA and diuretics. She did miss a dose of Eliquis 05/08/20.  Today, she denies symptoms of chest pain, orthopnea, PND, lower extremity edema, dizziness, presyncope, syncope, bleeding, or neurologic sequela. The patient is tolerating medications without difficulties and is otherwise without complaint today.    Atrial Fibrillation Risk Factors:  she does not have symptoms or diagnosis of sleep apnea. she does not have a history of rheumatic fever.   she has a BMI of Body mass index is 24.1 kg/m.Marland Kitchen Filed Weights   05/09/20 1142  Weight: 63.7 kg    Family History  Problem Relation Age of Onset  . Heart disease Brother   . Heart disease Sister   . Breast cancer Sister   . Leukemia Brother   . Breast cancer Sister   . Breast cancer Other        one deceased, one living  . Colon cancer Neg Hx      Atrial Fibrillation Management history:  Previous antiarrhythmic drugs: none Previous cardioversions: none Previous ablations: none CHADS2VASC score: 6 Anticoagulation history: Eliquis   Past Medical History:  Diagnosis Date  . A-fib ([place])   . Anxiety   . Cancer ([place])    breast  . COPD (chronic obstructive  pulmonary disease) ([place])   . Depression   . Hypertension   . TIA (transient ischemic attack)    remote   Past Surgical History:  Procedure Laterality Date  . ABDOMINAL HYSTERECTOMY    . APPENDECTOMY    . BACK SURGERY     total of five surgeries  . BREAST BIOPSY Left    benign  . BREAST CAPSULECTOMY WITH IMPLANT EXCHANGE Right 05/27/2016   Procedure: REMOVAL AND REPLACEMENT OF RIGHT BREAST IMPLANT AND BREAST CAPSULECTOMY;  Surgeon: Cristine Polio, MD;  Location: [facility];  Service: Plastics;  Laterality: Right;  . CARDIAC CATHETERIZATION    . CHOLECYSTECTOMY    . COLONOSCOPY  08/2014   Dr. Britta Mccreedy: Small sessile polyp at the cecum, removed, tubular adenoma  . ESOPHAGOGASTRODUODENOSCOPY  06/2013   Dr. Britta Mccreedy: Hiatal hernia, Schatzki ring, nonobstructive.  Marland Kitchen MASTECTOMY     right   . ORIF ANKLE FRACTURE Left 06/01/2014   Procedure: OPEN REDUCTION INTERNAL FIXATION (ORIF) LEFT ANKLE FRACTURE;  Surgeon: Marybelle Killings, MD;  Location: [facility];  Service: Orthopedics;  Laterality: Left;  . ROTATOR CUFF REPAIR     left  . TOTAL HIP ARTHROPLASTY     right  . TOTAL KNEE ARTHROPLASTY     bilateral    Current Outpatient Medications  Medication Sig Dispense Refill  . albuterol (PROVENTIL HFA;VENTOLIN HFA) 108 (90 Base) MCG/ACT inhaler Inhale 2 puffs into the lungs every 6 (six) hours as needed for wheezing or shortness of  breath.    Marland Kitchen albuterol (PROVENTIL) (2.5 MG/3ML) 0.083% nebulizer solution Take 3 mLs (2.5 mg total) by nebulization every 6 (six) hours as needed for wheezing or shortness of breath. 360 mL 5  . atorvastatin (LIPITOR) 20 MG tablet Take 1 tablet by mouth daily.    . calcium carbonate (OS-CAL - DOSED IN MG OF ELEMENTAL CALCIUM) 1250 (500 Ca) MG tablet Take 1 tablet by mouth daily with breakfast.    . clonazePAM (KLONOPIN) 1 MG tablet Take 1 tablet (1 mg total) by mouth at bedtime. 30 tablet 0  . diltiazem (CARDIZEM CD) 120 MG 24 hr capsule Take 1 capsule (120 mg  total) by mouth daily. (long acting) 30 capsule 6  . diltiazem (CARDIZEM) 30 MG tablet Take 1 tablet (30 mg total) by mouth every 8 (eight) hours as needed (for heartrate greater than 100). 30 tablet 2  . ELIQUIS 5 MG TABS tablet Take 1 tablet by mouth twice daily 60 tablet 6  . esomeprazole (NEXIUM) 20 MG capsule Take 20 mg by mouth daily at 12 noon.    . furosemide (LASIX) 20 MG tablet Take 20 mg by mouth.    Marland Kitchen guaiFENesin (ROBITUSSIN) 100 MG/5ML SOLN Take 5 mLs (100 mg total) by mouth every 4 (four) hours as needed for cough or to loosen phlegm. 236 mL 0  . hydrochlorothiazide (HYDRODIURIL) 12.5 MG tablet Take 1 tablet by mouth daily.    Marland Kitchen ibandronate (BONIVA) 150 MG tablet Take 150 mg by mouth every 30 (thirty) days.    Marland Kitchen losartan (COZAAR) 50 MG tablet Take 1 tablet by mouth daily.    . metoprolol tartrate (LOPRESSOR) 25 MG tablet Take 1 tablet (25 mg total) by mouth 2 (two) times daily. 180 tablet 3  . Multiple Vitamin (MULTIVITAMIN) tablet Take 1 tablet by mouth daily.    . ondansetron (ZOFRAN-ODT) 4 MG disintegrating tablet Take 4-8 mg by mouth every 8 (eight) hours as needed.    . OXYGEN Inhale 2 L into the lungs at bedtime.    Marland Kitchen Respiratory Therapy Supplies (FLUTTER) DEVI 1 Device by Does not apply route in the morning, at noon, in the evening, and at bedtime. 1 each 0  . TRELEGY ELLIPTA 100-62.5-25 MCG/INH AEPB INHALE ONE PUFF into THE lungs DAILY 60 each 5  . triamcinolone cream (KENALOG) 0.1 % Apply 1 application topically as needed.    . venlafaxine (EFFEXOR) 75 MG tablet Take 150 mg by mouth every morning. & 75 mg in the evening    . vitamin B-12 (CYANOCOBALAMIN) 100 MCG tablet Take 200 mcg by mouth daily.      No current facility-administered medications for this encounter.    Allergies  Allergen Reactions  . Morphine And Related Nausea And Vomiting  . Penicillins Rash  . Sulfa Antibiotics Nausea And Vomiting    Social History   Socioeconomic History  . Marital status:  Widowed    Spouse name: Not on file  . Number of children: Not on file  . Years of education: Not on file  . Highest education level: Not on file  Occupational History  . Not on file  Tobacco Use  . Smoking status: Never Smoker  . Smokeless tobacco: Never Used  Vaping Use  . Vaping Use: Never used  Substance and Sexual Activity  . Alcohol use: No    Alcohol/week: 0.0 standard drinks  . Drug use: No  . Sexual activity: Not on file  Other Topics Concern  . Not on file  Social History Narrative  . Not on file   Social Determinants of Health   Financial Resource Strain: Not on file  Food Insecurity: Not on file  Transportation Needs: Not on file  Physical Activity: Not on file  Stress: Not on file  Social Connections: Not on file  Intimate Partner Violence: Not on file     ROS- All systems are reviewed and negative except as per the HPI above.  Physical Exam: Vitals:   05/09/20 1142  BP: 112/80  Pulse: (!) 152  Weight: 63.7 kg  Height: 5\' 4"  (1.626 m)    GEN- The patient is a well appearing elderly female, alert and oriented x 3 today.   Head- normocephalic, atraumatic Eyes-  Sclera clear, conjunctiva pink Ears- hearing intact Oropharynx- clear Neck- supple  Lungs- Clear to ausculation bilaterally, diminished breath sounds. Heart- irregular rate and rhythm, tachycardia, no murmurs, rubs or gallops  GI- soft, NT, ND, + BS Extremities- no clubbing, cyanosis, or edema MS- no significant deformity or atrophy Skin- no rash or lesion Psych- euthymic mood, full affect Neuro- strength and sensation are intact  Wt Readings from Last 3 Encounters:  05/09/20 63.7 kg  05/05/20 64.8 kg  04/05/20 67.3 kg    EKG today demonstrates  Afib RVR Vent. rate 152 BPM QRS duration 80 ms QT/QTc 314/499 ms  Echo 12/30/19 demonstrated  1. Left ventricular ejection fraction, by estimation, is 60 to 65%. The  left ventricle has normal function. The left ventricle has no  regional  wall motion abnormalities. Left ventricular diastolic parameters are  consistent with Grade II diastolic  dysfunction (pseudonormalization).  2. Right ventricular systolic function is normal. The right ventricular  size is normal. There is mildly to moderately elevated pulmonary artery  systolic pressure. The estimated right ventricular systolic pressure is  87.5 mmHg.  3. Left atrial size was moderately dilated.  4. The mitral valve is abnormal. Moderate mitral valve regurgitation.  5. The aortic valve is tricuspid. Aortic valve regurgitation is not  visualized. Mild aortic valve sclerosis is present, with no evidence of  aortic valve stenosis.  6. The inferior vena cava is normal in size with greater than 50%  respiratory variability, suggesting right atrial pressure of 3 mmHg.   Epic records are reviewed at length today  CHA2DS2-VASc Score = 7  The patient's score is based upon: CHF History: No HTN History: Yes Diabetes History: No Stroke History: Yes Vascular Disease History: Yes Age Score: 2 Gender Score: 1      ASSESSMENT AND PLAN: 1. Persistent Atrial Fibrillation (ICD10:  I48.19) The patient's CHA2DS2-VASc score is 7, indicating a 11.2% annual risk of stroke.   Patient remains in rapid afib with symptoms of heart racing. We discussed therapeutic options today. Would avoid class 1C with CAD and would also avoid amiodarone with significant lung disease. Could consider dofetilide loading. Will arrange for TEE guided DCCV given her missed doses of anticoagulation. I worry that she would have clinical deterioration if we waited for 3 weeks.  Check bmet/CBC. She does not require a COVID test (positive test in Care Everywhere on 04/10/20) Continue Eliquis 5 mg BID Continue diltiazem 120 mg daily Continue Lopressor 25 mg BID  2. Secondary Hypercoagulable State (ICD10:  D68.69) The patient is at significant risk for stroke/thromboembolism based upon her  CHA2DS2-VASc Score of 7.  Continue Apixaban (Eliquis).   3. HTN Stable, no room to increase rate control at this time.  4. CAD Noted on chest CT.  5. Hypokalemia  1.8 at ED 04/10/20. Bmet as above.   Follow up in the AF clinic post DCCV.    [facility] [address] [place], [place] [address] [phone] 05/09/2020 12:10 PM

## 2020-05-10 DIAGNOSIS — M818 Other osteoporosis without current pathological fracture: Secondary | ICD-10-CM | POA: Diagnosis not present

## 2020-05-10 DIAGNOSIS — I482 Chronic atrial fibrillation, unspecified: Secondary | ICD-10-CM | POA: Diagnosis not present

## 2020-05-10 DIAGNOSIS — R69 Illness, unspecified: Secondary | ICD-10-CM | POA: Diagnosis not present

## 2020-05-10 DIAGNOSIS — K219 Gastro-esophageal reflux disease without esophagitis: Secondary | ICD-10-CM | POA: Diagnosis not present

## 2020-05-10 DIAGNOSIS — Z7401 Bed confinement status: Secondary | ICD-10-CM | POA: Diagnosis not present

## 2020-05-10 DIAGNOSIS — I10 Essential (primary) hypertension: Secondary | ICD-10-CM | POA: Diagnosis not present

## 2020-05-10 DIAGNOSIS — J449 Chronic obstructive pulmonary disease, unspecified: Secondary | ICD-10-CM | POA: Diagnosis not present

## 2020-05-10 DIAGNOSIS — I739 Peripheral vascular disease, unspecified: Secondary | ICD-10-CM | POA: Diagnosis not present

## 2020-05-10 DIAGNOSIS — E782 Mixed hyperlipidemia: Secondary | ICD-10-CM | POA: Diagnosis not present

## 2020-05-17 ENCOUNTER — Other Ambulatory Visit: Payer: Self-pay | Admitting: Cardiovascular Disease

## 2020-05-17 ENCOUNTER — Other Ambulatory Visit: Payer: Self-pay

## 2020-05-17 ENCOUNTER — Ambulatory Visit (HOSPITAL_COMMUNITY): Payer: Medicare HMO | Admitting: Certified Registered"

## 2020-05-17 ENCOUNTER — Other Ambulatory Visit (HOSPITAL_COMMUNITY): Payer: Self-pay | Admitting: Cardiology

## 2020-05-17 ENCOUNTER — Ambulatory Visit (HOSPITAL_COMMUNITY)
Admission: RE | Admit: 2020-05-17 | Discharge: 2020-05-17 | Disposition: A | Discharge status: Home / Self Care | Payer: Medicare HMO | Attending: Cardiovascular Disease | Admitting: Cardiovascular Disease

## 2020-05-17 ENCOUNTER — Encounter (HOSPITAL_COMMUNITY)
Admission: RE | Disposition: A | Discharge status: Home / Self Care | Payer: Self-pay | Source: Home / Self Care | Attending: Cardiovascular Disease

## 2020-05-17 ENCOUNTER — Encounter (HOSPITAL_COMMUNITY): Payer: Self-pay | Admitting: Cardiovascular Disease

## 2020-05-17 ENCOUNTER — Ambulatory Visit (HOSPITAL_BASED_OUTPATIENT_CLINIC_OR_DEPARTMENT_OTHER)
Admission: RE | Admit: 2020-05-17 | Discharge: 2020-05-17 | Disposition: A | Discharge status: Home / Self Care | Payer: Medicare HMO | Source: Ambulatory Visit | Attending: Physician Assistant | Admitting: Physician Assistant

## 2020-05-17 DIAGNOSIS — D6869 Other thrombophilia: Secondary | ICD-10-CM | POA: Insufficient documentation

## 2020-05-17 DIAGNOSIS — I340 Nonrheumatic mitral (valve) insufficiency: Secondary | ICD-10-CM

## 2020-05-17 DIAGNOSIS — Z79899 Other long term (current) drug therapy: Secondary | ICD-10-CM | POA: Insufficient documentation

## 2020-05-17 DIAGNOSIS — I2510 Atherosclerotic heart disease of native coronary artery without angina pectoris: Secondary | ICD-10-CM | POA: Diagnosis not present

## 2020-05-17 DIAGNOSIS — J449 Chronic obstructive pulmonary disease, unspecified: Secondary | ICD-10-CM | POA: Insufficient documentation

## 2020-05-17 DIAGNOSIS — I081 Rheumatic disorders of both mitral and tricuspid valves: Secondary | ICD-10-CM | POA: Diagnosis not present

## 2020-05-17 DIAGNOSIS — I4891 Unspecified atrial fibrillation: Secondary | ICD-10-CM

## 2020-05-17 DIAGNOSIS — J441 Chronic obstructive pulmonary disease with (acute) exacerbation: Secondary | ICD-10-CM | POA: Diagnosis not present

## 2020-05-17 DIAGNOSIS — Z885 Allergy status to narcotic agent status: Secondary | ICD-10-CM | POA: Diagnosis not present

## 2020-05-17 DIAGNOSIS — Z880 Allergy status to penicillin: Secondary | ICD-10-CM | POA: Insufficient documentation

## 2020-05-17 DIAGNOSIS — Z7901 Long term (current) use of anticoagulants: Secondary | ICD-10-CM | POA: Insufficient documentation

## 2020-05-17 DIAGNOSIS — I10 Essential (primary) hypertension: Secondary | ICD-10-CM | POA: Insufficient documentation

## 2020-05-17 DIAGNOSIS — I4819 Other persistent atrial fibrillation: Secondary | ICD-10-CM | POA: Insufficient documentation

## 2020-05-17 DIAGNOSIS — E876 Hypokalemia: Secondary | ICD-10-CM | POA: Insufficient documentation

## 2020-05-17 DIAGNOSIS — Z882 Allergy status to sulfonamides status: Secondary | ICD-10-CM | POA: Insufficient documentation

## 2020-05-17 DIAGNOSIS — I361 Nonrheumatic tricuspid (valve) insufficiency: Secondary | ICD-10-CM | POA: Diagnosis not present

## 2020-05-17 HISTORY — PX: CARDIOVERSION: SHX1299

## 2020-05-17 HISTORY — PX: TEE WITHOUT CARDIOVERSION: SHX5443

## 2020-05-17 SURGERY — ECHOCARDIOGRAM, TRANSESOPHAGEAL
Anesthesia: General

## 2020-05-17 MED ORDER — POTASSIUM CHLORIDE ER 10 MEQ PO TBCR: 20.0000 meq | EXTENDED_RELEASE_TABLET | Freq: Every day | ORAL | 3 refills | Status: DC

## 2020-05-17 MED ORDER — PROPOFOL 10 MG/ML IV BOLUS
INTRAVENOUS | Status: DC | PRN
  Administered 2020-05-17: 20 mg via INTRAVENOUS

## 2020-05-17 MED ORDER — SODIUM CHLORIDE 0.9 % IV SOLN: INTRAVENOUS | Status: DC

## 2020-05-17 MED ORDER — PROPOFOL 500 MG/50ML IV EMUL
INTRAVENOUS | Status: DC | PRN
  Administered 2020-05-17: 100 ug/kg/min via INTRAVENOUS

## 2020-05-17 MED ORDER — PHENYLEPHRINE HCL (PRESSORS) 10 MG/ML IV SOLN
INTRAVENOUS | Status: DC | PRN
  Administered 2020-05-17: 80 ug via INTRAVENOUS

## 2020-05-17 MED ORDER — BUTAMBEN-TETRACAINE-BENZOCAINE 2-2-14 % EX AERO
INHALATION_SPRAY | CUTANEOUS | Status: DC | PRN
  Administered 2020-05-17: 2 via TOPICAL

## 2020-05-17 NOTE — Anesthesia Preprocedure Evaluation (Signed)
Anesthesia Evaluation    Reviewed: Allergy & Precautions, Patient's Chart, lab work & pertinent test results, Unable to perform ROS - Chart review only  Airway Mallampati: II  TM Distance: <3 FB Neck ROM: Full    Dental  (+) Teeth Intact   Pulmonary shortness of breath, COPD,    breath sounds clear to auscultation       Cardiovascular hypertension, Pt. on home beta blockers and Pt. on medications + dysrhythmias  Rhythm:Irregular Rate:Normal  Echo 12/21/2019 1. Left ventricular ejection fraction, by estimation, is 60 to 65%. The left ventricle has normal function. The left ventricle has no regional wall motion abnormalities. Left ventricular diastolic parameters are consistent with Grade II diastolic dysfunction (pseudonormalization).  2. Right ventricular systolic function is normal. The right ventricular size is normal. There is mildly to moderately elevated pulmonary artery systolic pressure. The estimated right ventricular systolic pressure is 27.2 mmHg.  3. Left atrial size was moderately dilated.  4. The mitral valve is abnormal. Moderate mitral valve regurgitation.  5. The aortic valve is tricuspid. Aortic valve regurgitation is not visualized. Mild aortic valve sclerosis is present, with no evidence of aortic valve stenosis.  6. The inferior vena cava is normal in size with greater than 50% respiratory variability, suggesting right atrial pressure of 3 mmHg.    Neuro/Psych PSYCHIATRIC DISORDERS Anxiety Depression TIA   GI/Hepatic Neg liver ROS, hiatal hernia, GERD  ,  Endo/Other    Renal/GU Renal InsufficiencyRenal disease     Musculoskeletal   Abdominal   Peds  Hematology negative hematology ROS (+)   Anesthesia Other Findings   Reproductive/Obstetrics                             Anesthesia Physical  Anesthesia Plan  ASA: III  Anesthesia Plan: General   Post-op Pain Management:     Induction: Intravenous  PONV Risk Score and Plan: 2 and Propofol infusion and Treatment may vary due to age or medical condition  Airway Management Planned: Mask  Additional Equipment: None  Intra-op Plan:   Post-operative Plan:   Informed Consent:   Plan Discussed with:   Anesthesia Plan Comments:         Anesthesia Quick Evaluation

## 2020-05-17 NOTE — Anesthesia Procedure Notes (Signed)
Procedure Name: MAC Date/Time: 05/17/2020 11:12 AM Performed by: Amadeo Garnet, CRNA Pre-anesthesia Checklist: Patient identified, Emergency Drugs available, Suction available and Patient being monitored Patient Re-evaluated:Patient Re-evaluated prior to induction Oxygen Delivery Method: Nasal cannula Preoxygenation: Pre-oxygenation with 100% oxygen Placement Confirmation: positive ETCO2 Dental Injury: Teeth and Oropharynx as per pre-operative assessment

## 2020-05-17 NOTE — Interval H&P Note (Signed)
History and Physical Interval Note:  05/17/2020 10:41 AM  Amanda Palmer  has presented today for surgery, with the diagnosis of AFIB.  The various methods of treatment have been discussed with the patient and family. After consideration of risks, benefits and other options for treatment, the patient has consented to  Procedure(s): TRANSESOPHAGEAL ECHOCARDIOGRAM (TEE) (N/A) CARDIOVERSION (N/A) as a surgical intervention.  The patient's history has been reviewed, patient examined, no change in status, stable for surgery.  I have reviewed the patient's chart and labs.  Questions were answered to the patient's satisfaction.     Mertie Moores

## 2020-05-17 NOTE — CV Procedure (Signed)
    Transesophageal Echocardiogram Note  Amanda Palmer [id number] 21-Jan-1934  Procedure: Transesophageal Echocardiogram Indications: atrial fib   Procedure Details Consent: Obtained Time Out: Verified patient identification, verified procedure, site/side was marked, verified correct patient position, special equipment/implants available, Radiology Safety Procedures followed,  medications/allergies/relevent history reviewed, required imaging and test results available.  Performed  Medications:  During this procedure the patient is administered a propofol drip ( total of 85 mg for TEE and CV ) by CRNA Palma Holter.   for  sedation.   She had a single dose of Neosynephrine 80 mcg IV .  The patient's heart rate, blood pressure, and oxygen saturation are monitored continuously during the procedure. The period of conscious sedation is 30  minutes, of which I was present face-to-face 100% of this time.  Left Ventrical:   Moderate LV dysfunction.   EF 35-40%  Mitral Valve: moderate MR   Aortic Valve: 3 leaflet valve , no AS or AI  Tricuspid Valve: normal valve,  Moderate - severe TR   Pulmonic Valve: trivial PI   Left Atrium/ Left atrial appendage: no thrombi   Atrial septum: intact, no ASD or PFO  Aorta:  Normal.    Complications: No apparent complications Patient did tolerate procedure well.   Thayer Headings, Brooke Bonito., MD, [facility] 05/17/2020, 11:33 AM

## 2020-05-17 NOTE — Transfer of Care (Signed)
Immediate Anesthesia Transfer of Care Note  Patient: Amanda Palmer  Procedure(s) Performed: TRANSESOPHAGEAL ECHOCARDIOGRAM (TEE) (N/A ) CARDIOVERSION (N/A )  Patient Location: Endoscopy Unit  Anesthesia Type:MAC  Level of Consciousness: drowsy  Airway & Oxygen Therapy: Patient Spontanous Breathing and Patient connected to nasal cannula oxygen  Post-op Assessment: Report given to RN and Post -op Vital signs reviewed and stable  Post vital signs: Reviewed  Last Vitals:  Vitals Value Taken Time  BP 112/47 05/17/20 1134  Temp    Pulse 80 05/17/20 1135  Resp 25 05/17/20 1135  SpO2 100 % 05/17/20 1135  Vitals shown include unvalidated device data.  Last Pain:  Vitals:   05/17/20 1133  PainSc: Asleep         Complications: No complications documented.

## 2020-05-17 NOTE — Discharge Instructions (Signed)
TEE  YOU HAD AN CARDIAC PROCEDURE TODAY: Refer to the procedure report and other information in the discharge instructions given to you for any specific questions about what was found during the examination. If this information does not answer your questions, please call [facility] office at [phone] to clarify.   DIET: Your first meal following the procedure should be a light meal and then it is ok to progress to your normal diet. A half-sandwich or bowl of soup is an example of a good first meal. Heavy or fried foods are harder to digest and may make you feel nauseous or bloated. Drink plenty of fluids but you should avoid alcoholic beverages for 24 hours. If you had a esophageal dilation, please see attached instructions for diet.   ACTIVITY: Your care partner should take you home directly after the procedure. You should plan to take it easy, moving slowly for the rest of the day. You can resume normal activity the day after the procedure however YOU SHOULD NOT DRIVE, use power tools, machinery or perform tasks that involve climbing or major physical exertion for 24 hours (because of the sedation medicines used during the test).   SYMPTOMS TO REPORT IMMEDIATELY: A cardiologist can be reached at any hour. Please call [phone] for any of the following symptoms:  Vomiting of blood or coffee ground material  New, significant abdominal pain  New, significant chest pain or pain under the shoulder blades  Painful or persistently difficult swallowing  New shortness of breath  Black, tarry-looking or red, bloody stools  FOLLOW UP:  Please also call with any specific questions about appointments or follow up tests.   Electrical Cardioversion Electrical cardioversion is the delivery of a jolt of electricity to restore a normal rhythm to the heart. A rhythm that is too fast or is not regular keeps the heart from pumping well. In this procedure, sticky patches or metal paddles are placed on  the chest to deliver electricity to the heart from a device. This procedure may be done in an emergency if:  There is low or no blood pressure as a result of the heart rhythm.  Normal rhythm must be restored as fast as possible to protect the brain and heart from further damage.  It may save a life. This may also be a scheduled procedure for irregular or fast heart rhythms that are not immediately life-threatening. Tell a health care provider about:  Any allergies you have.  All medicines you are taking, including vitamins, herbs, eye drops, creams, and over-the-counter medicines.  Any problems you or family members have had with anesthetic medicines.  Any blood disorders you have.  Any surgeries you have had.  Any medical conditions you have.  Whether you are pregnant or may be pregnant. What are the risks? Generally, this is a safe procedure. However, problems may occur, including:  Allergic reactions to medicines.  A blood clot that breaks free and travels to other parts of your body.  The possible return of an abnormal heart rhythm within hours or days after the procedure.  Your heart stopping (cardiac arrest). This is rare. What happens before the procedure? Medicines  Your health care provider may have you start taking: ? Blood-thinning medicines (anticoagulants) so your blood does not clot as easily. ? Medicines to help stabilize your heart rate and rhythm.  Ask your health care provider about: ? Changing or stopping your regular medicines. This is especially important if you are taking diabetes medicines or blood   thinners. ? Taking medicines such as aspirin and ibuprofen. These medicines can thin your blood. Do not take these medicines unless your health care provider tells you to take them. ? Taking over-the-counter medicines, vitamins, herbs, and supplements. General instructions  Follow instructions from your health care provider about eating or drinking  restrictions.  Plan to have someone take you home from the hospital or clinic.  If you will be going home right after the procedure, plan to have someone with you for 24 hours.  Ask your health care provider what steps will be taken to help prevent infection. These may include washing your skin with a germ-killing soap. What happens during the procedure?  An IV will be inserted into one of your veins.  Sticky patches (electrodes) or metal paddles may be placed on your chest.  You will be given a medicine to help you relax (sedative).  An electrical shock will be delivered. The procedure may vary among health care providers and hospitals.   What can I expect after the procedure?  Your blood pressure, heart rate, breathing rate, and blood oxygen level will be monitored until you leave the hospital or clinic.  Your heart rhythm will be watched to make sure it does not change.  You may have some redness on the skin where the shocks were given. Follow these instructions at home:  Do not drive for 24 hours if you were given a sedative during your procedure.  Take over-the-counter and prescription medicines only as told by your health care provider.  Ask your health care provider how to check your pulse. Check it often.  Rest for 48 hours after the procedure or as told by your health care provider.  Avoid or limit your caffeine use as told by your health care provider.  Keep all follow-up visits as told by your health care provider. This is important. Contact a health care provider if:  You feel like your heart is beating too quickly or your pulse is not regular.  You have a serious muscle cramp that does not go away. Get help right away if:  You have discomfort in your chest.  You are dizzy or you feel faint.  You have trouble breathing or you are short of breath.  Your speech is slurred.  You have trouble moving an arm or leg on one side of your body.  Your fingers or  toes turn cold or blue. Summary  Electrical cardioversion is the delivery of a jolt of electricity to restore a normal rhythm to the heart.  This procedure may be done right away in an emergency or may be a scheduled procedure if the condition is not an emergency.  Generally, this is a safe procedure.  After the procedure, check your pulse often as told by your health care provider. This information is not intended to replace advice given to you by your health care provider. Make sure you discuss any questions you have with your health care provider. Document Revised: 09/28/2018 Document Reviewed: 09/28/2018 Elsevier Patient Education  2021 [place] After This sheet gives you information about how to care for yourself after your procedure. Your health care provider may also give you more specific instructions. If you have problems or questions, contact your health care provider. What can I expect after the procedure? After the procedure, it is common to have:  Tiredness.  Forgetfulness about what happened after the procedure.  Impaired judgment for important  decisions.  Nausea or vomiting.  Some difficulty with balance. Follow these instructions at home: For the time period you were told by your health care provider:  Rest as needed.  Do not participate in activities where you could fall or become injured.  Do not drive or use machinery.  Do not drink alcohol.  Do not take sleeping pills or medicines that cause drowsiness.  Do not make important decisions or sign legal documents.  Do not take care of children on your own.      Eating and drinking  Follow the diet that is recommended by your health care provider.  Drink enough fluid to keep your urine pale yellow.  If you vomit: ? Drink water, juice, or soup when you can drink without vomiting. ? Make sure you have little or no nausea before eating solid foods. General  instructions  Have a responsible adult stay with you for the time you are told. It is important to have someone help care for you until you are awake and alert.  Take over-the-counter and prescription medicines only as told by your health care provider.  If you have sleep apnea, surgery and certain medicines can increase your risk for breathing problems. Follow instructions from your health care provider about wearing your sleep device: ? Anytime you are sleeping, including during daytime naps. ? While taking prescription pain medicines, sleeping medicines, or medicines that make you drowsy.  Avoid smoking.  Keep all follow-up visits as told by your health care provider. This is important. Contact a health care provider if:  You keep feeling nauseous or you keep vomiting.  You feel light-headed.  You are still sleepy or having trouble with balance after 24 hours.  You develop a rash.  You have a fever.  You have redness or swelling around the IV site. Get help right away if:  You have trouble breathing.  You have new-onset confusion at home. Summary  For several hours after your procedure, you may feel tired. You may also be forgetful and have poor judgment.  Have a responsible adult stay with you for the time you are told. It is important to have someone help care for you until you are awake and alert.  Rest as told. Do not drive or operate machinery. Do not drink alcohol or take sleeping pills.  Get help right away if you have trouble breathing, or if you suddenly become confused. This information is not intended to replace advice given to you by your health care provider. Make sure you discuss any questions you have with your health care provider. Document Revised: 11/11/2019 Document Reviewed: 01/28/2019 Elsevier Patient Education  2021 [employer].

## 2020-05-19 ENCOUNTER — Encounter (HOSPITAL_COMMUNITY): Payer: Self-pay | Admitting: Cardiovascular Disease

## 2020-05-19 DIAGNOSIS — J449 Chronic obstructive pulmonary disease, unspecified: Secondary | ICD-10-CM | POA: Diagnosis not present

## 2020-05-19 NOTE — Anesthesia Postprocedure Evaluation (Signed)
Anesthesia Post Note  Patient: Amanda Palmer  Procedure(s) Performed: TRANSESOPHAGEAL ECHOCARDIOGRAM (TEE) (N/A ) CARDIOVERSION (N/A )     Patient location during evaluation: Endoscopy Anesthesia Type: General Level of consciousness: awake and alert Pain management: pain level controlled Vital Signs Assessment: post-procedure vital signs reviewed and stable Respiratory status: spontaneous breathing Cardiovascular status: stable Anesthetic complications: no   No complications documented.  Last Vitals:  Vitals:   05/17/20 1225 05/17/20 1235  BP: (!) 120/54 (!) 118/53  Pulse: 83 85  Resp: 18 14  Temp:    SpO2: 100% 100%    Last Pain:  Vitals:   05/18/20 1655  TempSrc:   PainSc: 0-No pain                 Nolon Nations

## 2020-05-24 ENCOUNTER — Other Ambulatory Visit: Payer: Self-pay

## 2020-05-24 ENCOUNTER — Ambulatory Visit (HOSPITAL_COMMUNITY)
Admission: RE | Admit: 2020-05-24 | Discharge: 2020-05-24 | Disposition: A | Discharge status: Home / Self Care | Payer: Medicare HMO | Source: Ambulatory Visit | Attending: Physician Assistant | Admitting: Physician Assistant

## 2020-05-24 ENCOUNTER — Encounter (HOSPITAL_COMMUNITY): Payer: Self-pay | Admitting: Physician Assistant

## 2020-05-24 VITALS — BP 150/90 | HR 114 | Ht 64.0 in | Wt 137.0 lb

## 2020-05-24 DIAGNOSIS — E785 Hyperlipidemia, unspecified: Secondary | ICD-10-CM | POA: Diagnosis not present

## 2020-05-24 DIAGNOSIS — Z7983 Long term (current) use of bisphosphonates: Secondary | ICD-10-CM | POA: Insufficient documentation

## 2020-05-24 DIAGNOSIS — Z9011 Acquired absence of right breast and nipple: Secondary | ICD-10-CM | POA: Insufficient documentation

## 2020-05-24 DIAGNOSIS — Z8249 Family history of ischemic heart disease and other diseases of the circulatory system: Secondary | ICD-10-CM | POA: Diagnosis not present

## 2020-05-24 DIAGNOSIS — I2510 Atherosclerotic heart disease of native coronary artery without angina pectoris: Secondary | ICD-10-CM | POA: Diagnosis not present

## 2020-05-24 DIAGNOSIS — I5020 Unspecified systolic (congestive) heart failure: Secondary | ICD-10-CM | POA: Diagnosis not present

## 2020-05-24 DIAGNOSIS — Z7901 Long term (current) use of anticoagulants: Secondary | ICD-10-CM | POA: Insufficient documentation

## 2020-05-24 DIAGNOSIS — Z8616 Personal history of COVID-19: Secondary | ICD-10-CM | POA: Diagnosis not present

## 2020-05-24 DIAGNOSIS — J449 Chronic obstructive pulmonary disease, unspecified: Secondary | ICD-10-CM | POA: Diagnosis not present

## 2020-05-24 DIAGNOSIS — Z96653 Presence of artificial knee joint, bilateral: Secondary | ICD-10-CM | POA: Insufficient documentation

## 2020-05-24 DIAGNOSIS — Z880 Allergy status to penicillin: Secondary | ICD-10-CM | POA: Diagnosis not present

## 2020-05-24 DIAGNOSIS — D6869 Other thrombophilia: Secondary | ICD-10-CM | POA: Diagnosis not present

## 2020-05-24 DIAGNOSIS — E876 Hypokalemia: Secondary | ICD-10-CM | POA: Diagnosis not present

## 2020-05-24 DIAGNOSIS — I110 Hypertensive heart disease with heart failure: Secondary | ICD-10-CM | POA: Diagnosis not present

## 2020-05-24 DIAGNOSIS — Z8673 Personal history of transient ischemic attack (TIA), and cerebral infarction without residual deficits: Secondary | ICD-10-CM | POA: Insufficient documentation

## 2020-05-24 DIAGNOSIS — Z882 Allergy status to sulfonamides status: Secondary | ICD-10-CM | POA: Diagnosis not present

## 2020-05-24 DIAGNOSIS — Z79899 Other long term (current) drug therapy: Secondary | ICD-10-CM | POA: Insufficient documentation

## 2020-05-24 DIAGNOSIS — Z9049 Acquired absence of other specified parts of digestive tract: Secondary | ICD-10-CM | POA: Insufficient documentation

## 2020-05-24 DIAGNOSIS — Z885 Allergy status to narcotic agent status: Secondary | ICD-10-CM | POA: Diagnosis not present

## 2020-05-24 DIAGNOSIS — I4819 Other persistent atrial fibrillation: Secondary | ICD-10-CM | POA: Diagnosis not present

## 2020-05-24 LAB — BASIC METABOLIC PANEL
Anion gap: 6 (ref 5–15)
BUN: 17 mg/dL (ref 8–23)
CO2: 29 mmol/L (ref 22–32)
Calcium: 9.8 mg/dL (ref 8.9–10.3)
Chloride: 98 mmol/L (ref 98–111)
Creatinine, Ser: 1.13 mg/dL — ABNORMAL HIGH (ref 0.44–1.00)
GFR, Estimated: 48 mL/min — ABNORMAL LOW (ref >60)
Glucose, Bld: 72 mg/dL (ref 70–99)
Potassium: 4.6 mmol/L (ref 3.5–5.1)
Sodium: 133 mmol/L — ABNORMAL LOW (ref 135–145)

## 2020-05-24 LAB — CBC
HCT: 32.2 % — ABNORMAL LOW (ref 36.0–46.0)
Hemoglobin: 9.8 g/dL — ABNORMAL LOW (ref 12.0–15.0)
MCH: 28.1 pg (ref 26.0–34.0)
MCHC: 30.4 g/dL (ref 30.0–36.0)
MCV: 92.3 fL (ref 80.0–100.0)
Platelets: 301 10*3/uL (ref 150–400)
RBC: 3.49 MIL/uL — ABNORMAL LOW (ref 3.87–5.11)
RDW: 16.8 % — ABNORMAL HIGH (ref 11.5–15.5)
WBC: 5.8 10*3/uL (ref 4.0–10.5)
nRBC: 0 % (ref 0.0–0.2)

## 2020-05-24 MED ORDER — METOPROLOL SUCCINATE ER 50 MG PO TB24: 50.0000 mg | ORAL_TABLET | Freq: Every day | ORAL | 3 refills | Status: DC

## 2020-05-24 NOTE — Patient Instructions (Signed)
STOP metoprolol tartrate (lopressor)  START metoprolol succinate (toprol xl) 50mg  once a day

## 2020-05-24 NOTE — Progress Notes (Addendum)
Primary Care Physician: Celene Squibb, MD Primary Cardiologist: Dr Harl Bowie Primary Electrophysiologist: none Referring Physician: Levell July   Amanda Palmer is a 87 y.o. female with a history of HTN, HLD, COPD, atrial fibrillation who presents for follow up in the [facility]. The patient was initially diagnosed with atrial fibrillation remotely. Patient is on Eliquis for a CHADS2VASC score of 6. She was seen at [facility] ED 04/10/20 with COVID pneumonia. She was also in rapid afib at that time. She was seen by Levell July on 05/05/20 and remained in rapid atrial fibrillation. Diltiazem was started for rate control. She has symptoms of palpitations and heart racing but overall her SOB has improved with resolution of PNA and diuretics. She did miss a dose of Eliquis 05/08/20.  On follow up today, patient is s/p TEE guided DCCV on 05/17/20. TEE showed a reduced EF 35-40%. She reports symptomatic improvement despite being back in afib. She now has portable O2 which has improved her SOB. Her weight is down and she denies any edema or orthopnea. She denies any bleeding issues on anticoagulation.    Today, she denies symptoms of palpitations, chest pain, orthopnea, PND, lower extremity edema, dizziness, presyncope, syncope, bleeding, or neurologic sequela. The patient is tolerating medications without difficulties and is otherwise without complaint today.    Atrial Fibrillation Risk Factors:  she does not have symptoms or diagnosis of sleep apnea. she does not have a history of rheumatic fever.   she has a BMI of Body mass index is 23.52 kg/m.Marland Kitchen Filed Weights   05/24/20 1325  Weight: 62.1 kg    Family History  Problem Relation Age of Onset  . Heart disease Brother   . Heart disease Sister   . Breast cancer Sister   . Leukemia Brother   . Breast cancer Sister   . Breast cancer Other        one deceased, one living  . Colon cancer Neg Hx      Atrial Fibrillation  Management history:  Previous antiarrhythmic drugs: none Previous cardioversions: 05/17/20 Previous ablations: none CHADS2VASC score: 6 Anticoagulation history: Eliquis   Past Medical History:  Diagnosis Date  . A-fib ([place])   . Anxiety   . Cancer ([place])    breast  . COPD (chronic obstructive pulmonary disease) ([place])   . Depression   . Hypertension   . TIA (transient ischemic attack)    remote   Past Surgical History:  Procedure Laterality Date  . ABDOMINAL HYSTERECTOMY    . APPENDECTOMY    . BACK SURGERY     total of five surgeries  . BREAST BIOPSY Left    benign  . BREAST CAPSULECTOMY WITH IMPLANT EXCHANGE Right 05/27/2016   Procedure: REMOVAL AND REPLACEMENT OF RIGHT BREAST IMPLANT AND BREAST CAPSULECTOMY;  Surgeon: Cristine Polio, MD;  Location: [place];  Service: Plastics;  Laterality: Right;  . CARDIAC CATHETERIZATION    . CARDIOVERSION N/A 05/17/2020   Procedure: CARDIOVERSION;  Surgeon: Acie Fredrickson Wonda Cheng, MD;  Location: [place];  Service: Cardiovascular;  Laterality: N/A;  . CHOLECYSTECTOMY    . COLONOSCOPY  08/2014   Dr. Britta Mccreedy: Small sessile polyp at the cecum, removed, tubular adenoma  . ESOPHAGOGASTRODUODENOSCOPY  06/2013   Dr. Britta Mccreedy: Hiatal hernia, Schatzki ring, nonobstructive.  Marland Kitchen MASTECTOMY     right   . ORIF ANKLE FRACTURE Left 06/01/2014   Procedure: OPEN REDUCTION INTERNAL FIXATION (ORIF) LEFT ANKLE FRACTURE;  Surgeon: Marybelle Killings,  MD;  Location: [place];  Service: Orthopedics;  Laterality: Left;  . ROTATOR CUFF REPAIR     left  . TEE WITHOUT CARDIOVERSION N/A 05/17/2020   Procedure: TRANSESOPHAGEAL ECHOCARDIOGRAM (TEE);  Surgeon: Acie Fredrickson Wonda Cheng, MD;  Location: [place];  Service: Cardiovascular;  Laterality: N/A;  . TOTAL HIP ARTHROPLASTY     right  . TOTAL KNEE ARTHROPLASTY     bilateral    Current Outpatient Medications  Medication Sig Dispense Refill  . albuterol (PROVENTIL HFA;VENTOLIN HFA) 108 (90 Base) MCG/ACT inhaler  Inhale 2 puffs into the lungs every 6 (six) hours as needed for wheezing or shortness of breath.    Marland Kitchen albuterol (PROVENTIL) (2.5 MG/3ML) 0.083% nebulizer solution Take 3 mLs (2.5 mg total) by nebulization every 6 (six) hours as needed for wheezing or shortness of breath. 360 mL 5  . atorvastatin (LIPITOR) 20 MG tablet Take 20 mg by mouth at bedtime.    . clonazePAM (KLONOPIN) 1 MG tablet Take 1 tablet (1 mg total) by mouth at bedtime. 30 tablet 0  . diltiazem (CARDIZEM CD) 120 MG 24 hr capsule Take 1 capsule (120 mg total) by mouth daily. (long acting) 30 capsule 6  . diltiazem (CARDIZEM) 30 MG tablet Take 1 tablet (30 mg total) by mouth every 8 (eight) hours as needed (for heartrate greater than 100). 30 tablet 2  . ELIQUIS 5 MG TABS tablet Take 1 tablet by mouth twice daily 60 tablet 6  . esomeprazole (NEXIUM) 20 MG capsule Take 20 mg by mouth daily.    . famotidine (PEPCID) 20 MG tablet Take 20 mg by mouth at bedtime.    . furosemide (LASIX) 20 MG tablet Take 20 mg by mouth daily.    . hydrochlorothiazide (HYDRODIURIL) 12.5 MG tablet Take 12.5 mg by mouth daily.    Marland Kitchen ibandronate (BONIVA) 150 MG tablet Take 150 mg by mouth every 30 (thirty) days.    Marland Kitchen losartan (COZAAR) 50 MG tablet Take 50 mg by mouth daily.    . metoprolol tartrate (LOPRESSOR) 25 MG tablet Take 1 tablet (25 mg total) by mouth 2 (two) times daily. 180 tablet 3  . ondansetron (ZOFRAN-ODT) 4 MG disintegrating tablet Take 4-8 mg by mouth every 8 (eight) hours as needed for nausea or vomiting.    . OXYGEN Inhale 3 L into the lungs continuous.    . potassium chloride (KLOR-CON) 10 MEQ tablet Take 2 tablets (20 mEq total) by mouth daily. 90 tablet 3  . Probiotic Product (PROBIOTIC DAILY) CAPS Take 1 capsule by mouth daily.    Marland Kitchen Respiratory Therapy Supplies (FLUTTER) DEVI 1 Device by Does not apply route in the morning, at noon, in the evening, and at bedtime. 1 each 0  . SILTUSSIN SA 100 MG/5ML syrup Take 5 mLs by mouth every 4 (four)  hours as needed.    . TRELEGY ELLIPTA 100-62.5-25 MCG/INH AEPB INHALE ONE PUFF into THE lungs DAILY 60 each 5  . triamcinolone cream (KENALOG) 0.1 % Apply 1 application topically daily at 12 noon.    . venlafaxine (EFFEXOR) 75 MG tablet Take 75-150 mg by mouth See admin instructions. Take 150 mg in the morning and 75 mg in the evening     No current facility-administered medications for this encounter.    Allergies  Allergen Reactions  . Morphine And Related Nausea And Vomiting  . Penicillins Rash    Reaction: unknown  . Sulfa Antibiotics Nausea And Vomiting    Social History   Socioeconomic  History  . Marital status: Widowed    Spouse name: Not on file  . Number of children: Not on file  . Years of education: Not on file  . Highest education level: Not on file  Occupational History  . Not on file  Tobacco Use  . Smoking status: Never Smoker  . Smokeless tobacco: Never Used  Vaping Use  . Vaping Use: Never used  Substance and Sexual Activity  . Alcohol use: No    Alcohol/week: 0.0 standard drinks  . Drug use: No  . Sexual activity: Not on file  Other Topics Concern  . Not on file  Social History Narrative  . Not on file   Social Determinants of Health   Financial Resource Strain: Not on file  Food Insecurity: Not on file  Transportation Needs: Not on file  Physical Activity: Not on file  Stress: Not on file  Social Connections: Not on file  Intimate Partner Violence: Not on file     ROS- All systems are reviewed and negative except as per the HPI above.  Physical Exam: Vitals:   05/24/20 1325  BP: (!) 150/90  Pulse: (!) 114  Weight: 62.1 kg  Height: 5\' 4"  (1.626 m)    GEN- The patient is a well appearing elderly female, alert and oriented x 3 today.   HEENT-head normocephalic, atraumatic, sclera clear, conjunctiva pink, hearing intact, trachea midline. Lungs- Clear to ausculation bilaterally, diminished breath sounds, O2 nasal canula  Heart-  irregular rate and rhythm, no murmurs, rubs or gallops  GI- soft, NT, ND, + BS Extremities- no clubbing, cyanosis, or edema MS- no significant deformity or atrophy Skin- no rash or lesion Psych- euthymic mood, full affect Neuro- strength and sensation are intact   Wt Readings from Last 3 Encounters:  05/24/20 62.1 kg  05/09/20 63.7 kg  05/05/20 64.8 kg    EKG today demonstrates  Afib, LAFB Vent. rate 114 BPM PR interval * ms QRS duration 82 ms QT/QTc 278/383 ms  Echo 12/30/19 demonstrated  1. Left ventricular ejection fraction, by estimation, is 60 to 65%. The  left ventricle has normal function. The left ventricle has no regional  wall motion abnormalities. Left ventricular diastolic parameters are  consistent with Grade II diastolic  dysfunction (pseudonormalization).  2. Right ventricular systolic function is normal. The right ventricular  size is normal. There is mildly to moderately elevated pulmonary artery  systolic pressure. The estimated right ventricular systolic pressure is  42.8 mmHg.  3. Left atrial size was moderately dilated.  4. The mitral valve is abnormal. Moderate mitral valve regurgitation.  5. The aortic valve is tricuspid. Aortic valve regurgitation is not  visualized. Mild aortic valve sclerosis is present, with no evidence of  aortic valve stenosis.  6. The inferior vena cava is normal in size with greater than 50%  respiratory variability, suggesting right atrial pressure of 3 mmHg.   Epic records are reviewed at length today  CHA2DS2-VASc Score = 7  The patient's score is based upon: CHF History: No HTN History: Yes Diabetes History: No Stroke History: Yes Vascular Disease History: Yes Age Score: 2 Gender Score: 1      ASSESSMENT AND PLAN: 1. Persistent Atrial Fibrillation (ICD10:  I48.19) The patient's CHA2DS2-VASc score is 7, indicating a 11.2% annual risk of stroke.   S/p TEE/DCCV on 05/17/20 Unfortunately, she is back in rapid  afib although her heart rate is slower. AAD options are limited. Would avoid class 1C with CAD, avoid Multaq  with systolic dysfunction. Could consider dofetilide although her QT post DCCV was 493 ms (444 on 01/18/20). Could also consider amiodarone but she does have significant baseline lung disease.  Will change Lopressor to Toprol 50 mg daily for rate control and reduced EF. Will refer to EP to also discuss options.  Continue Eliquis 5 mg BID Continue diltiazem 120 mg daily  2. Secondary Hypercoagulable State (ICD10:  D68.69) The patient is at significant risk for stroke/thromboembolism based upon her CHA2DS2-VASc Score of 7.  Continue Apixaban (Eliquis).   3. HTN Stable, med changes as above.   4. CAD Noted on chest CT.  5. Systolic dysfunction  TEE showed EF 35-40% Suspect related to #1 given her EF was normal in October. Continue BB and losartan No signs or symptoms of fluid overload, her weight is down.  6. Hypokalemia  Recheck bmet after starting KCl.    Follow up with EP to discuss rhythm options.    [facility] [address] [place], [place] [address] [phone] 05/24/2020 1:31 PM

## 2020-05-25 DIAGNOSIS — N6459 Other signs and symptoms in breast: Secondary | ICD-10-CM | POA: Diagnosis not present

## 2020-05-25 DIAGNOSIS — R531 Weakness: Secondary | ICD-10-CM | POA: Diagnosis not present

## 2020-05-25 DIAGNOSIS — I482 Chronic atrial fibrillation, unspecified: Secondary | ICD-10-CM | POA: Diagnosis not present

## 2020-05-25 DIAGNOSIS — G8929 Other chronic pain: Secondary | ICD-10-CM | POA: Diagnosis not present

## 2020-05-25 DIAGNOSIS — M25551 Pain in right hip: Secondary | ICD-10-CM | POA: Diagnosis not present

## 2020-05-25 DIAGNOSIS — D649 Anemia, unspecified: Secondary | ICD-10-CM | POA: Diagnosis not present

## 2020-05-25 DIAGNOSIS — E782 Mixed hyperlipidemia: Secondary | ICD-10-CM | POA: Diagnosis not present

## 2020-05-25 DIAGNOSIS — Z9882 Breast implant status: Secondary | ICD-10-CM | POA: Diagnosis not present

## 2020-05-25 DIAGNOSIS — K449 Diaphragmatic hernia without obstruction or gangrene: Secondary | ICD-10-CM | POA: Diagnosis not present

## 2020-05-25 DIAGNOSIS — J449 Chronic obstructive pulmonary disease, unspecified: Secondary | ICD-10-CM | POA: Diagnosis not present

## 2020-05-25 DIAGNOSIS — M79604 Pain in right leg: Secondary | ICD-10-CM | POA: Diagnosis not present

## 2020-05-25 DIAGNOSIS — R0602 Shortness of breath: Secondary | ICD-10-CM | POA: Diagnosis not present

## 2020-05-25 DIAGNOSIS — R600 Localized edema: Secondary | ICD-10-CM | POA: Diagnosis not present

## 2020-05-25 DIAGNOSIS — Z7401 Bed confinement status: Secondary | ICD-10-CM | POA: Diagnosis not present

## 2020-05-26 ENCOUNTER — Ambulatory Visit: Payer: Medicare HMO | Admitting: Internal Medicine

## 2020-05-26 ENCOUNTER — Encounter: Payer: Self-pay | Admitting: Internal Medicine

## 2020-05-26 ENCOUNTER — Telehealth: Payer: Self-pay | Admitting: Pharmacist

## 2020-05-26 ENCOUNTER — Other Ambulatory Visit: Payer: Self-pay

## 2020-05-26 VITALS — BP 104/62 | HR 125 | Ht 64.0 in | Wt 137.0 lb

## 2020-05-26 DIAGNOSIS — I4819 Other persistent atrial fibrillation: Secondary | ICD-10-CM | POA: Diagnosis not present

## 2020-05-26 DIAGNOSIS — I428 Other cardiomyopathies: Secondary | ICD-10-CM | POA: Diagnosis not present

## 2020-05-26 DIAGNOSIS — D6869 Other thrombophilia: Secondary | ICD-10-CM | POA: Diagnosis not present

## 2020-05-26 DIAGNOSIS — J42 Unspecified chronic bronchitis: Secondary | ICD-10-CM

## 2020-05-26 NOTE — Telephone Encounter (Signed)
Medication list reviewed in anticipation of upcoming Tikosyn initiation. Patient is taking 2 contraindicated or QTc prolonging medications. Patient will need to stop HCTZ at least 3 days prior to admission. Ondansetron should be used with caution and an alternative used if possible due to it's ability to prolong QTc.   Patient is anticoagulated on Eliquis on the appropriate dose. Please ensure that patient has not missed any anticoagulation doses in the 3 weeks prior to Tikosyn initiation.   Patient will need to be counseled to avoid use of Benadryl while on Tikosyn and in the 2-3 days prior to Tikosyn initiation.  Please ensure K is at least 4 and Mag is at least 2 prior to initiation of Tikosyn, especially since patient is on furosemide.

## 2020-05-26 NOTE — Progress Notes (Signed)
Electrophysiology Office Note   Date:  05/26/2020   ID:  Amanda Palmer, Amanda Palmer 18-Jul-1933, MRN [id number]  PCP:  Celene Squibb, MD  Cardiologist:  Dr Harl Bowie Primary Electrophysiologist: Thompson Grayer, MD    CC: afib   History of Present Illness: Amanda Palmer is a 87 y.o. female who presents today for electrophysiology evaluation.   She is referred by Dr Harl Bowie and Adline Peals for EP consultation. She has longstanding obstructive lung dz and is on home O2.  She recently had COVID as was found to have rapidly conducting afib.  Rate control has bene difficult.  [facility] 05/17/20 was not successful. EF 35-40%.    Past Medical History:  Diagnosis Date  . A-fib ([place])   . Anxiety   . Cancer ([place])    breast  . COPD (chronic obstructive pulmonary disease) ([place])   . Depression   . Hypertension   . TIA (transient ischemic attack)    remote   Past Surgical History:  Procedure Laterality Date  . ABDOMINAL HYSTERECTOMY    . APPENDECTOMY    . BACK SURGERY     total of five surgeries  . BREAST BIOPSY Left    benign  . BREAST CAPSULECTOMY WITH IMPLANT EXCHANGE Right 05/27/2016   Procedure: REMOVAL AND REPLACEMENT OF RIGHT BREAST IMPLANT AND BREAST CAPSULECTOMY;  Surgeon: Cristine Polio, MD;  Location: [place];  Service: Plastics;  Laterality: Right;  . CARDIAC CATHETERIZATION    . CARDIOVERSION N/A 05/17/2020   Procedure: CARDIOVERSION;  Surgeon: Acie Fredrickson Wonda Cheng, MD;  Location: [facility] [place];  Service: Cardiovascular;  Laterality: N/A;  . CHOLECYSTECTOMY    . COLONOSCOPY  08/2014   Dr. Britta Mccreedy: Small sessile polyp at the cecum, removed, tubular adenoma  . ESOPHAGOGASTRODUODENOSCOPY  06/2013   Dr. Britta Mccreedy: Hiatal hernia, Schatzki ring, nonobstructive.  Marland Kitchen MASTECTOMY     right   . ORIF ANKLE FRACTURE Left 06/01/2014   Procedure: OPEN REDUCTION INTERNAL FIXATION (ORIF) LEFT ANKLE FRACTURE;  Surgeon: Marybelle Killings, MD;  Location: [facility];  Service: Orthopedics;  Laterality:  Left;  . ROTATOR CUFF REPAIR     left  . TEE WITHOUT CARDIOVERSION N/A 05/17/2020   Procedure: TRANSESOPHAGEAL ECHOCARDIOGRAM (TEE);  Surgeon: Acie Fredrickson Wonda Cheng, MD;  Location: [place];  Service: Cardiovascular;  Laterality: N/A;  . TOTAL HIP ARTHROPLASTY     right  . TOTAL KNEE ARTHROPLASTY     bilateral     Current Outpatient Medications  Medication Sig Dispense Refill  . albuterol (PROVENTIL HFA;VENTOLIN HFA) 108 (90 Base) MCG/ACT inhaler Inhale 2 puffs into the lungs every 6 (six) hours as needed for wheezing or shortness of breath.    Marland Kitchen albuterol (PROVENTIL) (2.5 MG/3ML) 0.083% nebulizer solution Take 3 mLs (2.5 mg total) by nebulization every 6 (six) hours as needed for wheezing or shortness of breath. 360 mL 5  . atorvastatin (LIPITOR) 20 MG tablet Take 20 mg by mouth at bedtime.    . clonazePAM (KLONOPIN) 1 MG tablet Take 1 tablet (1 mg total) by mouth at bedtime. 30 tablet 0  . diltiazem (CARDIZEM CD) 120 MG 24 hr capsule Take 1 capsule (120 mg total) by mouth daily. (long acting) 30 capsule 6  . diltiazem (CARDIZEM) 30 MG tablet Take 1 tablet (30 mg total) by mouth every 8 (eight) hours as needed (for heartrate greater than 100). 30 tablet 2  . ELIQUIS 5 MG TABS tablet Take 1 tablet by mouth twice daily 60 tablet 6  .  esomeprazole (NEXIUM) 20 MG capsule Take 20 mg by mouth daily.    . famotidine (PEPCID) 20 MG tablet Take 20 mg by mouth at bedtime.    . furosemide (LASIX) 20 MG tablet Take 20 mg by mouth daily.    . hydrochlorothiazide (HYDRODIURIL) 12.5 MG tablet Take 12.5 mg by mouth daily.    Marland Kitchen ibandronate (BONIVA) 150 MG tablet Take 150 mg by mouth every 30 (thirty) days.    Marland Kitchen losartan (COZAAR) 50 MG tablet Take 50 mg by mouth daily.    . metoprolol succinate (TOPROL XL) 50 MG 24 hr tablet Take 1 tablet (50 mg total) by mouth daily. Take with or immediately following a meal. 30 tablet 3  . ondansetron (ZOFRAN-ODT) 4 MG disintegrating tablet Take 4-8 mg by mouth every 8  (eight) hours as needed for nausea or vomiting.    . OXYGEN Inhale 3 L into the lungs continuous.    . potassium chloride (KLOR-CON) 10 MEQ tablet Take 2 tablets (20 mEq total) by mouth daily. 90 tablet 3  . Probiotic Product (PROBIOTIC DAILY) CAPS Take 1 capsule by mouth daily.    Marland Kitchen Respiratory Therapy Supplies (FLUTTER) DEVI 1 Device by Does not apply route in the morning, at noon, in the evening, and at bedtime. 1 each 0  . SILTUSSIN SA 100 MG/5ML syrup Take 5 mLs by mouth every 4 (four) hours as needed.    . TRELEGY ELLIPTA 100-62.5-25 MCG/INH AEPB INHALE ONE PUFF into THE lungs DAILY 60 each 5  . triamcinolone cream (KENALOG) 0.1 % Apply 1 application topically daily at 12 noon.    . venlafaxine (EFFEXOR) 75 MG tablet Take 75-150 mg by mouth See admin instructions. Take 150 mg in the morning and 75 mg in the evening     No current facility-administered medications for this visit.    Allergies:   Morphine and related, Penicillins, and Sulfa antibiotics   Social History:  The patient  reports that she has never smoked. She has never used smokeless tobacco. She reports that she does not drink alcohol and does not use drugs.   Family History:  The patient's  family history includes Breast cancer in her sister, sister and another family member; Heart disease in her brother and sister; Leukemia in her brother.    ROS:  Please see the history of present illness.   All other systems are personally reviewed and negative.    PHYSICAL EXAM: VS:  BP 104/62   Pulse (!) 125   Ht 5\' 4"  (1.626 m)   Wt 137 lb (62.1 kg)   SpO2 97%   BMI 23.52 kg/m  , BMI Body mass index is 23.52 kg/m. GEN: elderly and frail, in no acute distress, wearing O2 HEENT: normal Neck: no JVD  Cardiac: iRRR  Respiratory  normal work of breathing, wearing O2 GI: soft  MS: diffuse atrophy Skin: warm and dry  Neuro:  Strength and sensation are intact Psych: euthymic mood, full affect  EKG:  EKG is ordered  today. The ekg ordered today is personally reviewed and shows afib with RVR   Recent Labs: 05/24/2020: BUN 17; Creatinine, Ser 1.13; Hemoglobin 9.8; Platelets 301; Potassium 4.6; Sodium 133  personally reviewed   Lipid Panel  No results found for: CHOL, TRIG, HDL, CHOLHDL, VLDL, LDLCALC, LDLDIRECT personally reviewed   Wt Readings from Last 3 Encounters:  05/26/20 137 lb (62.1 kg)  05/24/20 137 lb (62.1 kg)  05/09/20 140 lb 6.4 oz (63.7 kg)  Other studies personally reviewed: Additional studies/ records that were reviewed today include: AF clinic notes, cardiology notes, prior echo  Review of the above records today demonstrates: as above   ASSESSMENT AND PLAN:  1.  Persistent afib The patient has symptomatic, recurrent atrial fibrillation. She has not tried AADs.  She is not a candidate for ablation.  Given reduced EF, her options are tikosyn and amiodarone.  If not successful, may require AV nodal ablation and PPM. Chads2vasc score is at least 6.  she is anticoagulated with eliquis .  Risk, benefits, and alternatives to both tikosyn and amiodarone were also discussed in detail with patient and care giver today. They would  Prefer to try tikosyn.  We will arrange tikosyn admission Stop hctz 3 days prior to hospitalization.  2. Nonischemic CM Clinically stable but would do better with appropriate rate control bp is soft and therefore cannot further titrate medicines at this time.  3. Chronic lung disease Stable No change required today   Signed, Thompson Grayer, MD  05/26/2020 10:44 AM     [facility] [address] [place] [address] [phone] (office) [phone] (fax)

## 2020-05-26 NOTE — Patient Instructions (Addendum)
Medication Instructions:  You will stop Hydrochlorothiazide 3 days prior to Dofetilide admission.   Afib clinic will reach out to you to set this up.  Your physician recommends that you continue on your current medications as directed. Please refer to the Current Medication list given to you today.  Labwork: None ordered.  Testing/Procedures: None ordered.  Follow-Up: Your physician wants you to follow-up in: Afib Clinic will call you.   Any Other Special Instructions Will Be Listed Below (If Applicable).  If you need a refill on your cardiac medications before your next appointment, please call your pharmacy.     Dofetilide capsules What is this medicine? DOFETILIDE (doe FET il ide) is an antiarrhythmic drug. It helps make your heart beat regularly. This medicine also helps to slow rapid heartbeats. This medicine may be used for other purposes; ask your health care provider or pharmacist if you have questions. COMMON BRAND NAME(S): Tikosyn What should I tell my health care provider before I take this medicine? They need to know if you have any of these conditions:  heart disease  history of irregular heartbeat  history of low levels of potassium or magnesium in the blood  kidney disease  liver disease  an unusual or allergic reaction to dofetilide, other medicines, foods, dyes, or preservatives  pregnant or trying to get pregnant  breast-feeding How should I use this medicine? Take this medicine by mouth with a glass of water. Follow the directions on the prescription label. Do not take with grapefruit juice. You can take it with or without food. If it upsets your stomach, take it with food. Take your medicine at regular intervals. Do not take it more often than directed. Do not stop taking except on your doctor's advice. A special MedGuide will be given to you by the pharmacist with each prescription and refill. Be sure to read this information carefully each time. Talk  to your pediatrician regarding the use of this medicine in children. Special care may be needed. Overdosage: If you think you have taken too much of this medicine contact a poison control center or emergency room at once. NOTE: This medicine is only for you. Do not share this medicine with others. What if I miss a dose? If you miss a dose, skip it. Take your next dose at the normal time. Do not take extra or 2 doses at the same time to make up for the missed dose. What may interact with this medicine? Do not take this medicine with any of the following medications:  cimetidine  cisapride  dolutegravir  dronedarone  erdafitinib  hydrochlorothiazide  ketoconazole  megestrol  pimozide  prochlorperazine  thioridazine  trimethoprim  verapamil This medicine may also interact with the following medications:  amiloride  cannabinoids  certain antibiotics like erythromycin or clarithromycin  certain antiviral medicines for HIV or hepatitis  certain medicines for depression, anxiety, or psychotic disorders  digoxin  diltiazem  grapefruit juice  metformin  nefazodone  other medicines that prolong the QT interval (an abnormal heart rhythm)  quinine  triamterene  zafirlukast  ziprasidone This list may not describe all possible interactions. Give your health care provider a list of all the medicines, herbs, non-prescription drugs, or dietary supplements you use. Also tell them if you smoke, drink alcohol, or use illegal drugs. Some items may interact with your medicine. What should I watch for while using this medicine? Your condition will be monitored carefully while you are receiving this medicine. What side effects  may I notice from receiving this medicine? Side effects that you should report to your doctor or health care professional as soon as possible:  allergic reactions like skin rash, itching or hives, swelling of the face, lips, or tongue  breathing  problems  chest pain or chest tightness  dizziness  signs and symptoms of a dangerous change in heartbeat or heart rhythm like chest pain; dizziness; fast or irregular heartbeat; palpitations; feeling faint or lightheaded, falls; breathing problems  signs and symptoms of electrolyte imbalance like severe diarrhea, unusual sweating, vomiting, loss of appetite, increased thirst  swelling of the ankles, legs, or feet  tingling, numbness in the hands or feet Side effects that usually do not require medical attention (report to your doctor or health care professional if they continue or are bothersome):  diarrhea  general ill feeling or flu-like symptoms  headache  nausea  trouble sleeping  stomach pain This list may not describe all possible side effects. Call your doctor for medical advice about side effects. You may report side effects to FDA at 1-800-FDA-1088. Where should I keep my medicine? Keep out of the reach of children. Store at room temperature between 15 and 30 degrees C (59 and 86 degrees F). Throw away any unused medicine after the expiration date. NOTE: This sheet is a summary. It may not cover all possible information. If you have questions about this medicine, talk to your doctor, pharmacist, or health care provider.  2021 [employer]/Gold Standard (2019-01-26 12:14:05)

## 2020-05-29 DIAGNOSIS — I739 Peripheral vascular disease, unspecified: Secondary | ICD-10-CM | POA: Diagnosis not present

## 2020-05-29 DIAGNOSIS — M79672 Pain in left foot: Secondary | ICD-10-CM | POA: Diagnosis not present

## 2020-05-29 DIAGNOSIS — L110 Acquired keratosis follicularis: Secondary | ICD-10-CM | POA: Diagnosis not present

## 2020-05-29 DIAGNOSIS — M79671 Pain in right foot: Secondary | ICD-10-CM | POA: Diagnosis not present

## 2020-05-29 NOTE — Telephone Encounter (Signed)
Niece, Inez Catalina notified to stop HCTZ 3 days prior to admission as well as stop Zofran.

## 2020-06-01 NOTE — Progress Notes (Deleted)
Cardiology Office Note Cop[place] Date: 06/01/2020   ID: Amanda Palmer, DOB 1933-11-24, MRN [id number]  PCP:  Celene Squibb, MD  Cardiologist:  No primary care provider on file. Electrophysiologist:  None   Chief Complaint:  f/u persistent atrial fibrillation  History of Present Illness: Amanda Palmer is a 87 y.o. female with a history of atrial fibrillation, HTN, HLD, DOE, vocal cord dysfunction.    Last encounter with Dr Bronson Ing 04/29/2019. Having some exertional dyspnea, relieved with inhalers. No CP, leg swelling, dizziness, palpitations. Atrial fibrillation was stable.  Continuing metoprolol 25 mg twice daily, Eliquis 5 mg po bid,  CHADSVASC = 6. She was scheduled for high resolution CT chest. BP was mildly elevated. There were no changes to therapy. She was continuing statin therapy with Lipitor 20 mg po daily.  Recent presentation to [facility] ED 04/10/2020 with primary diagnosis of hypoxia, acute exacerbation of COPD with asthma/COVID-19.  She wears 3 L nasal cannula O2 at home.  EKG showed atrial fibrillation with RVR.  proBNP was 3392, potassium 1.8.  CT negative for PE.  Patchy multifocal groundglass opacities consistent with multifocal pneumonia correlate with COVID-19 testing.   She saw Mr. Fenton PA at atrial fibrillation clinic on 05/09/2020.  There was consideration of starting dofetilide and avoiding class Ic medications due to CAD and avoiding amiodarone secondary to significant lung disease.  Plans were to arrange for TEE guided DCCV given her missed doses of anticoagulation.  She was continuing Eliquis 5 mg p.o. twice daily.  Continuing diltiazem 120 mg daily.  Continue Lopressor 25 mg p.o. twice daily.  CHA2DS2-VASc score of 7.  She saw Mr. Fenton PA on 05/24/2020.  Status post follow-up TEE guided DCCV on 05/17/2020.  Unfortunately she was back in atrial fibrillation.  TEE showed a reduced EF of 35 to 40%.  Lopressor was changed to Toprol 50 mg daily for rate control and  reduced EF.  Patient was referred to EP to discuss options.  Saw Dr. Rayann Heman on 05/26/2020.  She was not a candidate for ablation.  Given reduced EF her options were Tikosyn and amiodarone.  If not successful may require AV nodal ablation and PPM.  She was arranged for Tikosyn admission.  She was to stop HCTZ 3 days prior to hospitalization.    Past Medical History:  Diagnosis Date  . A-fib ([place])   . Anxiety   . Cancer ([place])    breast  . COPD (chronic obstructive pulmonary disease) ([place])   . Depression   . Hypertension   . TIA (transient ischemic attack)    remote    Past Surgical History:  Procedure Laterality Date  . ABDOMINAL HYSTERECTOMY    . APPENDECTOMY    . BACK SURGERY     total of five surgeries  . BREAST BIOPSY Left    benign  . BREAST CAPSULECTOMY WITH IMPLANT EXCHANGE Right 05/27/2016   Procedure: REMOVAL AND REPLACEMENT OF RIGHT BREAST IMPLANT AND BREAST CAPSULECTOMY;  Surgeon: Cristine Polio, MD;  Location: [place];  Service: Plastics;  Laterality: Right;  . CARDIAC CATHETERIZATION    . CARDIOVERSION N/A 05/17/2020   Procedure: CARDIOVERSION;  Surgeon: Acie Fredrickson Wonda Cheng, MD;  Location: [place];  Service: Cardiovascular;  Laterality: N/A;  . CHOLECYSTECTOMY    . COLONOSCOPY  08/2014   Dr. Britta Mccreedy: Small sessile polyp at the cecum, removed, tubular adenoma  . ESOPHAGOGASTRODUODENOSCOPY  06/2013   Dr. Britta Mccreedy: Hiatal hernia, Schatzki ring, nonobstructive.  Marland Kitchen MASTECTOMY  right   . ORIF ANKLE FRACTURE Left 06/01/2014   Procedure: OPEN REDUCTION INTERNAL FIXATION (ORIF) LEFT ANKLE FRACTURE;  Surgeon: Marybelle Killings, MD;  Location: [place];  Service: Orthopedics;  Laterality: Left;  . ROTATOR CUFF REPAIR     left  . TEE WITHOUT CARDIOVERSION N/A 05/17/2020   Procedure: TRANSESOPHAGEAL ECHOCARDIOGRAM (TEE);  Surgeon: Acie Fredrickson Wonda Cheng, MD;  Location: [place];  Service: Cardiovascular;  Laterality: N/A;  . TOTAL HIP ARTHROPLASTY     right  . TOTAL  KNEE ARTHROPLASTY     bilateral    Current Outpatient Medications  Medication Sig Dispense Refill  . albuterol (PROVENTIL HFA;VENTOLIN HFA) 108 (90 Base) MCG/ACT inhaler Inhale 2 puffs into the lungs every 6 (six) hours as needed for wheezing or shortness of breath.    Marland Kitchen albuterol (PROVENTIL) (2.5 MG/3ML) 0.083% nebulizer solution Take 3 mLs (2.5 mg total) by nebulization every 6 (six) hours as needed for wheezing or shortness of breath. 360 mL 5  . atorvastatin (LIPITOR) 20 MG tablet Take 20 mg by mouth at bedtime.    . clonazePAM (KLONOPIN) 1 MG tablet Take 1 tablet (1 mg total) by mouth at bedtime. 30 tablet 0  . diltiazem (CARDIZEM CD) 120 MG 24 hr capsule Take 1 capsule (120 mg total) by mouth daily. (long acting) 30 capsule 6  . diltiazem (CARDIZEM) 30 MG tablet Take 1 tablet (30 mg total) by mouth every 8 (eight) hours as needed (for heartrate greater than 100). 30 tablet 2  . ELIQUIS 5 MG TABS tablet Take 1 tablet by mouth twice daily 60 tablet 6  . esomeprazole (NEXIUM) 20 MG capsule Take 20 mg by mouth daily.    . famotidine (PEPCID) 20 MG tablet Take 20 mg by mouth at bedtime.    . furosemide (LASIX) 20 MG tablet Take 20 mg by mouth daily.    . hydrochlorothiazide (HYDRODIURIL) 12.5 MG tablet Take 12.5 mg by mouth daily.    Marland Kitchen ibandronate (BONIVA) 150 MG tablet Take 150 mg by mouth every 30 (thirty) days.    Marland Kitchen losartan (COZAAR) 50 MG tablet Take 50 mg by mouth daily.    . metoprolol succinate (TOPROL XL) 50 MG 24 hr tablet Take 1 tablet (50 mg total) by mouth daily. Take with or immediately following a meal. 30 tablet 3  . ondansetron (ZOFRAN-ODT) 4 MG disintegrating tablet Take 4-8 mg by mouth every 8 (eight) hours as needed for nausea or vomiting.    . OXYGEN Inhale 3 L into the lungs continuous.    . potassium chloride (KLOR-CON) 10 MEQ tablet Take 2 tablets (20 mEq total) by mouth daily. 90 tablet 3  . Probiotic Product (PROBIOTIC DAILY) CAPS Take 1 capsule by mouth daily.    Marland Kitchen  Respiratory Therapy Supplies (FLUTTER) DEVI 1 Device by Does not apply route in the morning, at noon, in the evening, and at bedtime. 1 each 0  . SILTUSSIN SA 100 MG/5ML syrup Take 5 mLs by mouth every 4 (four) hours as needed.    . TRELEGY ELLIPTA 100-62.5-25 MCG/INH AEPB INHALE ONE PUFF into THE lungs DAILY 60 each 5  . triamcinolone cream (KENALOG) 0.1 % Apply 1 application topically daily at 12 noon.    . venlafaxine (EFFEXOR) 75 MG tablet Take 75-150 mg by mouth See admin instructions. Take 150 mg in the morning and 75 mg in the evening     No current facility-administered medications for this visit.   Allergies:  Morphine and related,  Penicillins, and Sulfa antibiotics   Social History: The patient  reports that she has never smoked. She has never used smokeless tobacco. She reports that she does not drink alcohol and does not use drugs.   Family History: The patient's family history includes Breast cancer in her sister, sister and another family member; Heart disease in her brother and sister; Leukemia in her brother.   ROS:  Please see the history of present illness. Otherwise, complete review of systems is positive for none.  All other systems are reviewed and negative.   Physical Exam: VS:  There were no vitals taken for this visit., BMI There is no height or weight on file to calculate BMI.  Wt Readings from Last 3 Encounters:  05/26/20 137 lb (62.1 kg)  05/24/20 137 lb (62.1 kg)  05/09/20 140 lb 6.4 oz (63.7 kg)    General: Patient appears comfortable at rest. Neck: Supple, no elevated JVP or carotid bruits, no thyromegaly. Lungs: Faint Rales in posterior left lung base, in no acute distress.  Nonlabored breathing at rest. Cardiac: Irregularly irregular tachycardia rate and rhythm, no S3 or significant systolic murmur, no pericardial rub. Extremities: Mild pitting edema bilaterally, distal pulses 2+. Skin: Warm and dry. Musculoskeletal: No kyphosis. Neuropsychiatric: Alert  and oriented x3, affect grossly appropriate.  ECG:  An ECG dated 05/05/2020 was personally reviewed today and demonstrated:  Atrial fibrillation with RVR rate of 142.  Recent Labwork: 05/24/2020: BUN 17; Creatinine, Ser 1.13; Hemoglobin 9.8; Platelets 301; Potassium 4.6; Sodium 133  No results found for: CHOL, TRIG, HDL, CHOLHDL, VLDL, LDLCALC, LDLDIRECT  Other Studies Reviewed Today:  TEE 05/17/2020  Left ventricular ejection fraction, by estimation, is 30 to 35%. The left ventricle has moderately decreased function. 2. Right ventricular systolic function is normal. The right ventricular size is normal. 3. Left atrial size was severely dilated. No left atrial/left atrial appendage thrombus was detected. 4. The mitral valve is normal in structure. Moderate mitral valve regurgitation. 5. Tricuspid valve regurgitation is moderate to severe. 6. The aortic valve is normal in structure. Aortic valve regurgitation is not visualized.      Echocardiogram 12/21/2019 1. Left ventricular ejection fraction, by estimation, is 60 to 65%. The left ventricle has normal function. The left ventricle has no regional wall motion abnormalities. Left ventricular diastolic parameters are consistent with Grade II diastolic dysfunction (pseudonormalization). 2. Right ventricular systolic function is normal. The right ventricular size is normal. There is mildly to moderately elevated pulmonary artery systolic pressure. The estimated right ventricular systolic pressure is 16.1 mmHg. 3. Left atrial size was moderately dilated. 4. The mitral valve is abnormal. Moderate mitral valve regurgitation. 5. The aortic valve is tricuspid. Aortic valve regurgitation is not visualized. Mild aortic valve sclerosis is present, with no evidence of aortic valve stenosis. 6. The inferior vena cava is normal in size with greater than 50% respiratory variability, suggesting right atrial pressure of 3 mmHg   Assessment and  Plan:   1. Persistent atrial fibrillation (HCC) EKG today shows atrial fibrillation with RVR at rate of 142.  Continue metoprolol 25 mg p.o. twice daily.  Continue Eliquis 5 mg p.o. twice daily.  Start Cardizem CD 120 mg p.o. daily.  Take Cardizem 30 mg daily as needed for heart rate greater than 100.  Refer to atrial fibrillation clinic for evaluation and management.  2. Essential hypertension Blood pressure well controlled with blood pressure 118/68.  Continue HCTZ 12.5 mg daily, losartan 50 mg p.o. daily.  3. Mixed  hyperlipidemia Continue atorvastatin 20 mg p.o. daily.  4. Shortness of breath/COPD/pulmonary nodules Recently noted to be short of breath at PCP office with rapid heart rate.  PCP started her on Cardizem 30 mg p.o. daily as needed for heart rate greater than 100, and Lasix 20 mg p.o. daily.  Today she denies any current shortness of breath.  Family member states shortness of breath and edema have improved.  Weight has decreased by 6 pounds.  Most recent echo 12/21/2019 demonstrated: EF 60 to 65%.  G2 DD, moderately elevated PASP estimated at 44.7 mmHg.  LA moderately dilated.  Moderate MR.  5.  Edema Patient and family members state recently saw PCP who started her on Cardizem 30 mg p.o. daily and Lasix 20 mg p.o. daily for rapid heart rate and lower extremity edema, shortness of breath, and weight gain.  Continues with some mild pitting lower extremity edema.  She has lost approximately 6 pounds since January 26 based on current weights in epic.  Continue Lasix 20 mg daily.  Medication Adjustments/Labs and Tests Ordered: Current medicines are reviewed at length with the patient today.  Concerns regarding medicines are outlined above.   Disposition: Follow-up with Dr. Harl Bowie or APP 1 month Signed, Levell July, NP 06/01/2020 1:12 PM    [facility] [place], [place], [place] [address] Phone: [phone]; Fax: [phone]

## 2020-06-02 ENCOUNTER — Ambulatory Visit: Payer: Medicare HMO | Admitting: Family Medicine

## 2020-06-04 DIAGNOSIS — J449 Chronic obstructive pulmonary disease, unspecified: Secondary | ICD-10-CM | POA: Diagnosis not present

## 2020-06-06 DIAGNOSIS — J449 Chronic obstructive pulmonary disease, unspecified: Secondary | ICD-10-CM | POA: Diagnosis not present

## 2020-06-07 DIAGNOSIS — E1165 Type 2 diabetes mellitus with hyperglycemia: Secondary | ICD-10-CM | POA: Diagnosis not present

## 2020-06-07 DIAGNOSIS — I10 Essential (primary) hypertension: Secondary | ICD-10-CM | POA: Diagnosis not present

## 2020-06-19 DIAGNOSIS — J449 Chronic obstructive pulmonary disease, unspecified: Secondary | ICD-10-CM | POA: Diagnosis not present

## 2020-06-20 ENCOUNTER — Inpatient Hospital Stay (HOSPITAL_COMMUNITY)
Admission: AD | Admit: 2020-06-20 | Discharge: 2020-06-23 | DRG: 309 | Disposition: A | Discharge status: Home Health | Payer: Medicare HMO | Source: Ambulatory Visit | Attending: Internal Medicine | Admitting: Internal Medicine

## 2020-06-20 ENCOUNTER — Ambulatory Visit (HOSPITAL_COMMUNITY)
Admission: RE | Admit: 2020-06-20 | Discharge: 2020-06-20 | Disposition: A | Discharge status: Home / Self Care | Payer: Medicare HMO | Source: Ambulatory Visit | Attending: Physician Assistant | Admitting: Physician Assistant

## 2020-06-20 ENCOUNTER — Other Ambulatory Visit: Payer: Self-pay

## 2020-06-20 ENCOUNTER — Encounter (HOSPITAL_COMMUNITY): Payer: Self-pay | Admitting: Physician Assistant

## 2020-06-20 ENCOUNTER — Encounter (HOSPITAL_COMMUNITY): Payer: Self-pay | Admitting: Internal Medicine

## 2020-06-20 VITALS — BP 122/82 | HR 130 | Ht 64.0 in | Wt 142.2 lb

## 2020-06-20 DIAGNOSIS — I4819 Other persistent atrial fibrillation: Secondary | ICD-10-CM | POA: Diagnosis not present

## 2020-06-20 DIAGNOSIS — Z803 Family history of malignant neoplasm of breast: Secondary | ICD-10-CM

## 2020-06-20 DIAGNOSIS — Z8673 Personal history of transient ischemic attack (TIA), and cerebral infarction without residual deficits: Secondary | ICD-10-CM

## 2020-06-20 DIAGNOSIS — D6869 Other thrombophilia: Secondary | ICD-10-CM | POA: Diagnosis not present

## 2020-06-20 DIAGNOSIS — I482 Chronic atrial fibrillation, unspecified: Secondary | ICD-10-CM | POA: Diagnosis present

## 2020-06-20 DIAGNOSIS — Z853 Personal history of malignant neoplasm of breast: Secondary | ICD-10-CM

## 2020-06-20 DIAGNOSIS — S300XXA Contusion of lower back and pelvis, initial encounter: Secondary | ICD-10-CM | POA: Diagnosis not present

## 2020-06-20 DIAGNOSIS — Z79899 Other long term (current) drug therapy: Secondary | ICD-10-CM | POA: Diagnosis not present

## 2020-06-20 DIAGNOSIS — I428 Other cardiomyopathies: Secondary | ICD-10-CM | POA: Diagnosis present

## 2020-06-20 DIAGNOSIS — I5022 Chronic systolic (congestive) heart failure: Secondary | ICD-10-CM | POA: Diagnosis not present

## 2020-06-20 DIAGNOSIS — Z9981 Dependence on supplemental oxygen: Secondary | ICD-10-CM | POA: Diagnosis not present

## 2020-06-20 DIAGNOSIS — E785 Hyperlipidemia, unspecified: Secondary | ICD-10-CM | POA: Diagnosis present

## 2020-06-20 DIAGNOSIS — I2510 Atherosclerotic heart disease of native coronary artery without angina pectoris: Secondary | ICD-10-CM | POA: Diagnosis present

## 2020-06-20 DIAGNOSIS — J449 Chronic obstructive pulmonary disease, unspecified: Secondary | ICD-10-CM | POA: Diagnosis not present

## 2020-06-20 DIAGNOSIS — W010XXA Fall on same level from slipping, tripping and stumbling without subsequent striking against object, initial encounter: Secondary | ICD-10-CM | POA: Diagnosis not present

## 2020-06-20 DIAGNOSIS — Z8616 Personal history of COVID-19: Secondary | ICD-10-CM

## 2020-06-20 DIAGNOSIS — Z8249 Family history of ischemic heart disease and other diseases of the circulatory system: Secondary | ICD-10-CM | POA: Diagnosis not present

## 2020-06-20 DIAGNOSIS — Z96653 Presence of artificial knee joint, bilateral: Secondary | ICD-10-CM | POA: Diagnosis present

## 2020-06-20 DIAGNOSIS — Z90710 Acquired absence of both cervix and uterus: Secondary | ICD-10-CM

## 2020-06-20 DIAGNOSIS — Z7951 Long term (current) use of inhaled steroids: Secondary | ICD-10-CM | POA: Diagnosis not present

## 2020-06-20 DIAGNOSIS — I110 Hypertensive heart disease with heart failure: Secondary | ICD-10-CM | POA: Diagnosis present

## 2020-06-20 DIAGNOSIS — Y92230 Patient room in hospital as the place of occurrence of the external cause: Secondary | ICD-10-CM | POA: Diagnosis not present

## 2020-06-20 DIAGNOSIS — I4821 Permanent atrial fibrillation: Secondary | ICD-10-CM | POA: Diagnosis present

## 2020-06-20 DIAGNOSIS — Z96641 Presence of right artificial hip joint: Secondary | ICD-10-CM | POA: Diagnosis present

## 2020-06-20 DIAGNOSIS — R55 Syncope and collapse: Secondary | ICD-10-CM

## 2020-06-20 DIAGNOSIS — F419 Anxiety disorder, unspecified: Secondary | ICD-10-CM | POA: Diagnosis present

## 2020-06-20 DIAGNOSIS — J9611 Chronic respiratory failure with hypoxia: Secondary | ICD-10-CM | POA: Diagnosis not present

## 2020-06-20 DIAGNOSIS — F32A Depression, unspecified: Secondary | ICD-10-CM | POA: Diagnosis present

## 2020-06-20 DIAGNOSIS — Z7901 Long term (current) use of anticoagulants: Secondary | ICD-10-CM

## 2020-06-20 DIAGNOSIS — R54 Age-related physical debility: Secondary | ICD-10-CM | POA: Diagnosis present

## 2020-06-20 DIAGNOSIS — Z9011 Acquired absence of right breast and nipple: Secondary | ICD-10-CM

## 2020-06-20 DIAGNOSIS — Z806 Family history of leukemia: Secondary | ICD-10-CM

## 2020-06-20 LAB — BASIC METABOLIC PANEL
Anion gap: 7 (ref 5–15)
BUN: 22 mg/dL (ref 8–23)
CO2: 27 mmol/L (ref 22–32)
Calcium: 9.4 mg/dL (ref 8.9–10.3)
Chloride: 101 mmol/L (ref 98–111)
Creatinine, Ser: 1.1 mg/dL — ABNORMAL HIGH (ref 0.44–1.00)
GFR, Estimated: 50 mL/min — ABNORMAL LOW (ref >60)
Glucose, Bld: 119 mg/dL — ABNORMAL HIGH (ref 70–99)
Potassium: 4.1 mmol/L (ref 3.5–5.1)
Sodium: 135 mmol/L (ref 135–145)

## 2020-06-20 LAB — MAGNESIUM: Magnesium: 1.8 mg/dL (ref 1.7–2.4)

## 2020-06-20 LAB — SARS CORONAVIRUS 2 (TAT 6-24 HRS): SARS Coronavirus 2: NEGATIVE

## 2020-06-20 MED ORDER — ENSURE ENLIVE PO LIQD
237.0000 mL | Freq: Two times a day (BID) | ORAL | Status: DC
  Administered 2020-06-20 – 2020-06-23 (×4): 237 mL via ORAL

## 2020-06-20 MED ORDER — CLONAZEPAM 0.5 MG PO TABS
1.0000 mg | ORAL_TABLET | Freq: Every day | ORAL | Status: DC
  Administered 2020-06-20 – 2020-06-22 (×3): 1 mg via ORAL
  Filled 2020-06-20 (×3): qty 2

## 2020-06-20 MED ORDER — FLUTICASONE FUROATE-VILANTEROL 100-25 MCG/INH IN AEPB
1.0000 | INHALATION_SPRAY | Freq: Every day | RESPIRATORY_TRACT | Status: DC
  Administered 2020-06-21 – 2020-06-23 (×3): 1 via RESPIRATORY_TRACT
  Filled 2020-06-20: qty 28

## 2020-06-20 MED ORDER — VENLAFAXINE HCL 75 MG PO TABS
75.0000 mg | ORAL_TABLET | Freq: Every day | ORAL | Status: DC
  Administered 2020-06-20 – 2020-06-22 (×3): 75 mg via ORAL
  Filled 2020-06-20 (×4): qty 1

## 2020-06-20 MED ORDER — MAGNESIUM SULFATE 2 GM/50ML IV SOLN
2.0000 g | Freq: Once | INTRAVENOUS | Status: AC
  Administered 2020-06-20: 2 g via INTRAVENOUS
  Filled 2020-06-20: qty 50

## 2020-06-20 MED ORDER — SODIUM CHLORIDE 0.9 % IV SOLN: 250.0000 mL | INTRAVENOUS | Status: DC | PRN

## 2020-06-20 MED ORDER — FLUTICASONE-UMECLIDIN-VILANT 100-62.5-25 MCG/INH IN AEPB: INHALATION_SPRAY | Freq: Every day | RESPIRATORY_TRACT | Status: DC

## 2020-06-20 MED ORDER — DOFETILIDE 125 MCG PO CAPS
125.0000 ug | ORAL_CAPSULE | Freq: Two times a day (BID) | ORAL | Status: DC
  Administered 2020-06-20 – 2020-06-23 (×6): 125 ug via ORAL
  Filled 2020-06-20 (×7): qty 1

## 2020-06-20 MED ORDER — METOPROLOL SUCCINATE ER 50 MG PO TB24
50.0000 mg | ORAL_TABLET | Freq: Every day | ORAL | Status: DC
  Administered 2020-06-21 – 2020-06-23 (×3): 50 mg via ORAL
  Filled 2020-06-20 (×3): qty 1

## 2020-06-20 MED ORDER — POTASSIUM CHLORIDE CRYS ER 10 MEQ PO TBCR
20.0000 meq | EXTENDED_RELEASE_TABLET | Freq: Every day | ORAL | Status: DC
  Administered 2020-06-21 – 2020-06-23 (×3): 20 meq via ORAL
  Filled 2020-06-20 (×4): qty 2

## 2020-06-20 MED ORDER — SODIUM CHLORIDE 0.9% FLUSH
3.0000 mL | INTRAVENOUS | Status: DC | PRN
  Administered 2020-06-22 (×2): 3 mL via INTRAVENOUS

## 2020-06-20 MED ORDER — LOSARTAN POTASSIUM 50 MG PO TABS
50.0000 mg | ORAL_TABLET | Freq: Every day | ORAL | Status: DC
  Administered 2020-06-21 – 2020-06-23 (×3): 50 mg via ORAL
  Filled 2020-06-20 (×3): qty 1

## 2020-06-20 MED ORDER — FUROSEMIDE 20 MG PO TABS
20.0000 mg | ORAL_TABLET | Freq: Every day | ORAL | Status: DC
  Administered 2020-06-21 – 2020-06-23 (×3): 20 mg via ORAL
  Filled 2020-06-20 (×3): qty 1

## 2020-06-20 MED ORDER — PANTOPRAZOLE SODIUM 40 MG PO TBEC
40.0000 mg | DELAYED_RELEASE_TABLET | Freq: Every day | ORAL | Status: DC
  Administered 2020-06-21 – 2020-06-23 (×3): 40 mg via ORAL
  Filled 2020-06-20 (×3): qty 1

## 2020-06-20 MED ORDER — APIXABAN 5 MG PO TABS
5.0000 mg | ORAL_TABLET | Freq: Two times a day (BID) | ORAL | Status: DC
  Administered 2020-06-20 – 2020-06-23 (×6): 5 mg via ORAL
  Filled 2020-06-20: qty 2
  Filled 2020-06-20 (×5): qty 1

## 2020-06-20 MED ORDER — DILTIAZEM HCL ER COATED BEADS 120 MG PO CP24
120.0000 mg | ORAL_CAPSULE | Freq: Every day | ORAL | Status: DC
  Administered 2020-06-21 – 2020-06-23 (×3): 120 mg via ORAL
  Filled 2020-06-20 (×3): qty 1

## 2020-06-20 MED ORDER — FAMOTIDINE 20 MG PO TABS
20.0000 mg | ORAL_TABLET | Freq: Every day | ORAL | Status: DC
  Administered 2020-06-20 – 2020-06-22 (×3): 20 mg via ORAL
  Filled 2020-06-20 (×3): qty 1

## 2020-06-20 MED ORDER — VENLAFAXINE HCL 75 MG PO TABS
150.0000 mg | ORAL_TABLET | Freq: Every day | ORAL | Status: DC
  Administered 2020-06-21 – 2020-06-23 (×3): 150 mg via ORAL
  Filled 2020-06-20 (×3): qty 2

## 2020-06-20 MED ORDER — ATORVASTATIN CALCIUM 10 MG PO TABS
20.0000 mg | ORAL_TABLET | Freq: Every day | ORAL | Status: DC
  Administered 2020-06-20 – 2020-06-22 (×3): 20 mg via ORAL
  Filled 2020-06-20 (×3): qty 2

## 2020-06-20 MED ORDER — UMECLIDINIUM BROMIDE 62.5 MCG/INH IN AEPB
1.0000 | INHALATION_SPRAY | Freq: Every day | RESPIRATORY_TRACT | Status: DC
  Administered 2020-06-21 – 2020-06-23 (×3): 1 via RESPIRATORY_TRACT
  Filled 2020-06-20: qty 7

## 2020-06-20 MED ORDER — ALBUTEROL SULFATE (2.5 MG/3ML) 0.083% IN NEBU: 2.5000 mg | INHALATION_SOLUTION | Freq: Four times a day (QID) | RESPIRATORY_TRACT | Status: DC | PRN

## 2020-06-20 MED ORDER — SODIUM CHLORIDE 0.9% FLUSH
3.0000 mL | Freq: Two times a day (BID) | INTRAVENOUS | Status: DC
  Administered 2020-06-20 – 2020-06-23 (×6): 3 mL via INTRAVENOUS

## 2020-06-20 NOTE — TOC Benefit Eligibility Note (Signed)
Transition of Care [facility]) Benefit Eligibility Note    Patient Details  Name: GARRETT BOWRING MRN: [id number] Date of Birth: [birth year]   Medication/Dose: DOFETILIDE 125 MCG BID _CO-PAY- $14.98    250 MCG BID -CO-PAY- $14.98   500  MCG BID  CO-PAY- $ 14.98  Covered?: Yes  Tier: 2 Drug  Prescription Coverage Preferred Pharmacy: [facility] with Person/Company/Phone Number:: ARA S. @  [employer]  [facility] # [phone]  Co-Pay: $14.98  Prior Approval: No  Deductible:  (NO DEDUCTIBLE WITH PLAN  /  OUT-OF-POCKET:MET  and  LOWE INCOME SUBSIDY LEVEL-4)  Additional Notes: TIKOSYN  : NOT COVER-/ NON-FORMULARY   P/A - YES #  [id number]   and  FAX # [id number]    Memory Argue Phone Number: 06/20/2020, 5:09 PM

## 2020-06-20 NOTE — Progress Notes (Signed)
Labs reviewed Mag 1.8, 2gm replacement ordered OK to start Tikosyn without a recheck on her [place], PA-C

## 2020-06-20 NOTE — Progress Notes (Signed)
   06/20/20 1305  Assess: MEWS Score  Temp 98.1 F (36.7 C)  BP 130/75  Pulse Rate (!) 131  ECG Heart Rate (!) 119  Resp 18  Level of Consciousness Alert  SpO2 100 %  O2 Device Nasal Cannula  O2 Flow Rate (L/min) 3 L/min  Assess: MEWS Score  MEWS Temp 0  MEWS Systolic 0  MEWS Pulse 2  MEWS RR 0  MEWS LOC 0  MEWS Score 2  MEWS Score Color Yellow  Assess: if the MEWS score is Yellow or Red  Were vital signs taken at a resting state? Yes  Focused Assessment No change from prior assessment  Early Detection of Sepsis Score *See Row Information* Low  MEWS guidelines implemented *See Row Information* Yes  Treat  MEWS Interventions Escalated (See documentation below)  Pain Scale 0-10  Pain Score 0  Take Vital Signs  Increase Vital Sign Frequency  Yellow: Q 2hr X 2 then Q 4hr X 2, if remains yellow, continue Q 4hrs  Escalate  MEWS: Escalate Yellow: discuss with charge nurse/RN and consider discussing with provider and RRT  Notify: Charge Nurse/RN  Name of Charge Nurse/RN Notified Kerrie Buffalo RN  Date Charge Nurse/RN Notified 06/20/20  Time Charge Nurse/RN Notified 1330  Document  Patient Outcome Other (Comment) (pt admitted in Afib for Tikosyn loading)  Progress note created (see row info) Yes

## 2020-06-20 NOTE — Progress Notes (Signed)
Primary Care Physician: Celene Squibb, MD Primary Cardiologist: Dr Harl Bowie Primary Electrophysiologist: Dr Rayann Heman Referring Physician: Levell July   Amanda Palmer is a 87 y.o. female with a history of HTN, HLD, COPD, atrial fibrillation who presents for follow up in the [facility]. The patient was initially diagnosed with atrial fibrillation remotely. Patient is on Eliquis for a CHADS2VASC score of 8. She was seen at [facility] ED 04/10/20 with COVID pneumonia. She was also in rapid afib at that time. She was seen by Levell July on 05/05/20 and remained in rapid atrial fibrillation. Diltiazem was started for rate control. She has symptoms of palpitations and heart racing but overall her SOB has improved with resolution of PNA and diuretics. She did miss a dose of Eliquis 05/08/20. Patient is s/p TEE guided DCCV on 05/17/20. TEE showed a reduced EF 35-40%.   On follow up today, patient presents for dofetilide admission. She reports she is doing about the same since her last visit with symptoms of heart racing. She is in rapid afib today. Patient reports she has stopped HCTZ 3 days ago and has not taken any Zofran. She denies any missed doses of anticoagulation.   Today, she denies symptoms of chest pain, orthopnea, PND, lower extremity edema, dizziness, presyncope, syncope, bleeding, or neurologic sequela. The patient is tolerating medications without difficulties and is otherwise without complaint today.    Atrial Fibrillation Risk Factors:  she does not have symptoms or diagnosis of sleep apnea. she does not have a history of rheumatic fever.   she has a BMI of Body mass index is 24.41 kg/m.Marland Kitchen Filed Weights   06/20/20 1143  Weight: 64.5 kg    Family History  Problem Relation Age of Onset  . Heart disease Brother   . Heart disease Sister   . Breast cancer Sister   . Leukemia Brother   . Breast cancer Sister   . Breast cancer Other        one deceased, one  living  . Colon cancer Neg Hx      Atrial Fibrillation Management history:  Previous antiarrhythmic drugs: none Previous cardioversions: 05/17/20 Previous ablations: none CHADS2VASC score: 8 Anticoagulation history: Eliquis   Past Medical History:  Diagnosis Date  . A-fib ([place])   . Anxiety   . Cancer ([place])    breast  . COPD (chronic obstructive pulmonary disease) ([place])   . Depression   . Hypertension   . TIA (transient ischemic attack)    remote   Past Surgical History:  Procedure Laterality Date  . ABDOMINAL HYSTERECTOMY    . APPENDECTOMY    . BACK SURGERY     total of five surgeries  . BREAST BIOPSY Left    benign  . BREAST CAPSULECTOMY WITH IMPLANT EXCHANGE Right 05/27/2016   Procedure: REMOVAL AND REPLACEMENT OF RIGHT BREAST IMPLANT AND BREAST CAPSULECTOMY;  Surgeon: Cristine Polio, MD;  Location: [employer];  Service: Plastics;  Laterality: Right;  . CARDIAC CATHETERIZATION    . CARDIOVERSION N/A 05/17/2020   Procedure: CARDIOVERSION;  Surgeon: Acie Fredrickson Wonda Cheng, MD;  Location: [place];  Service: Cardiovascular;  Laterality: N/A;  . CHOLECYSTECTOMY    . COLONOSCOPY  08/2014   Dr. Britta Mccreedy: Small sessile polyp at the cecum, removed, tubular adenoma  . ESOPHAGOGASTRODUODENOSCOPY  06/2013   Dr. Britta Mccreedy: Hiatal hernia, Schatzki ring, nonobstructive.  Marland Kitchen MASTECTOMY     right   . ORIF ANKLE FRACTURE Left 06/01/2014   Procedure:  OPEN REDUCTION INTERNAL FIXATION (ORIF) LEFT ANKLE FRACTURE;  Surgeon: Marybelle Killings, MD;  Location: [place];  Service: Orthopedics;  Laterality: Left;  . ROTATOR CUFF REPAIR     left  . TEE WITHOUT CARDIOVERSION N/A 05/17/2020   Procedure: TRANSESOPHAGEAL ECHOCARDIOGRAM (TEE);  Surgeon: Acie Fredrickson Wonda Cheng, MD;  Location: [place];  Service: Cardiovascular;  Laterality: N/A;  . TOTAL HIP ARTHROPLASTY     right  . TOTAL KNEE ARTHROPLASTY     bilateral    Current Outpatient Medications  Medication Sig Dispense Refill  .  albuterol (PROVENTIL HFA;VENTOLIN HFA) 108 (90 Base) MCG/ACT inhaler Inhale 2 puffs into the lungs every 6 (six) hours as needed for wheezing or shortness of breath.    Marland Kitchen albuterol (PROVENTIL) (2.5 MG/3ML) 0.083% nebulizer solution Take 3 mLs (2.5 mg total) by nebulization every 6 (six) hours as needed for wheezing or shortness of breath. 360 mL 5  . atorvastatin (LIPITOR) 20 MG tablet Take 20 mg by mouth at bedtime.    . clonazePAM (KLONOPIN) 1 MG tablet Take 1 tablet (1 mg total) by mouth at bedtime. 30 tablet 0  . diltiazem (CARDIZEM CD) 120 MG 24 hr capsule Take 1 capsule (120 mg total) by mouth daily. (long acting) 30 capsule 6  . diltiazem (CARDIZEM) 30 MG tablet Take 1 tablet (30 mg total) by mouth every 8 (eight) hours as needed (for heartrate greater than 100). 30 tablet 2  . ELIQUIS 5 MG TABS tablet Take 1 tablet by mouth twice daily 60 tablet 6  . esomeprazole (NEXIUM) 20 MG capsule Take 20 mg by mouth daily.    . famotidine (PEPCID) 20 MG tablet Take 20 mg by mouth at bedtime.    . furosemide (LASIX) 20 MG tablet Take 20 mg by mouth daily.    Marland Kitchen ibandronate (BONIVA) 150 MG tablet Take 150 mg by mouth every 30 (thirty) days.    Marland Kitchen losartan (COZAAR) 50 MG tablet Take 50 mg by mouth daily.    . metoprolol succinate (TOPROL XL) 50 MG 24 hr tablet Take 1 tablet (50 mg total) by mouth daily. Take with or immediately following a meal. 30 tablet 3  . OXYGEN Inhale 3 L into the lungs continuous.    . potassium chloride (KLOR-CON) 10 MEQ tablet Take 2 tablets (20 mEq total) by mouth daily. 90 tablet 3  . Probiotic Product (PROBIOTIC DAILY) CAPS Take 1 capsule by mouth daily.    Marland Kitchen Respiratory Therapy Supplies (FLUTTER) DEVI 1 Device by Does not apply route in the morning, at noon, in the evening, and at bedtime. 1 each 0  . SILTUSSIN SA 100 MG/5ML syrup Take 5 mLs by mouth every 4 (four) hours as needed.    . TRELEGY ELLIPTA 100-62.5-25 MCG/INH AEPB INHALE ONE PUFF into THE lungs DAILY 60 each 5  .  triamcinolone cream (KENALOG) 0.1 % Apply 1 application topically daily at 12 noon.    . venlafaxine (EFFEXOR) 75 MG tablet Take 75-150 mg by mouth See admin instructions. Take 150 mg in the morning and 75 mg in the evening     No current facility-administered medications for this encounter.    Allergies  Allergen Reactions  . Morphine And Related Nausea And Vomiting  . Penicillins Rash    Reaction: unknown  . Sulfa Antibiotics Nausea And Vomiting    Social History   Socioeconomic History  . Marital status: Widowed    Spouse name: Not on file  . Number of children: Not  on file  . Years of education: Not on file  . Highest education level: Not on file  Occupational History  . Not on file  Tobacco Use  . Smoking status: Never Smoker  . Smokeless tobacco: Never Used  Vaping Use  . Vaping Use: Never used  Substance and Sexual Activity  . Alcohol use: No    Alcohol/week: 0.0 standard drinks  . Drug use: No  . Sexual activity: Not on file  Other Topics Concern  . Not on file  Social History Narrative  . Not on file   Social Determinants of Health   Financial Resource Strain: Not on file  Food Insecurity: Not on file  Transportation Needs: Not on file  Physical Activity: Not on file  Stress: Not on file  Social Connections: Not on file  Intimate Partner Violence: Not on file     ROS- All systems are reviewed and negative except as per the HPI above.  Physical Exam: Vitals:   06/20/20 1143  BP: 122/82  Pulse: (!) 130  Weight: 64.5 kg  Height: 5\' 4"  (1.626 m)    GEN- The patient is a well appearing elderly female, alert and oriented x 3 today.   HEENT-head normocephalic, atraumatic, sclera clear, conjunctiva pink, hearing intact, trachea midline. Lungs- Clear to ausculation bilaterally, normal work of breathing Heart- irregular rate and rhythm, no murmurs, rubs or gallops  GI- soft, NT, ND, + BS Extremities- no clubbing, cyanosis, or edema MS- no significant  deformity or atrophy Skin- no rash or lesion Psych- euthymic mood, full affect Neuro- strength and sensation are intact   Wt Readings from Last 3 Encounters:  06/20/20 64.5 kg  05/26/20 62.1 kg  05/24/20 62.1 kg    EKG today demonstrates  Afib LAFB Vent. rate 130 BPM PR interval * ms QRS duration 86 ms QT/QTcB 332/488 ms  Echo 12/30/19 demonstrated  1. Left ventricular ejection fraction, by estimation, is 60 to 65%. The  left ventricle has normal function. The left ventricle has no regional  wall motion abnormalities. Left ventricular diastolic parameters are  consistent with Grade II diastolic  dysfunction (pseudonormalization).  2. Right ventricular systolic function is normal. The right ventricular  size is normal. There is mildly to moderately elevated pulmonary artery  systolic pressure. The estimated right ventricular systolic pressure is  17.0 mmHg.  3. Left atrial size was moderately dilated.  4. The mitral valve is abnormal. Moderate mitral valve regurgitation.  5. The aortic valve is tricuspid. Aortic valve regurgitation is not  visualized. Mild aortic valve sclerosis is present, with no evidence of  aortic valve stenosis.  6. The inferior vena cava is normal in size with greater than 50%  respiratory variability, suggesting right atrial pressure of 3 mmHg.   Epic records are reviewed at length today  CHA2DS2-VASc Score = 8  The patient's score is based upon: CHF History: Yes HTN History: Yes Diabetes History: No Stroke History: Yes Vascular Disease History: Yes Age Score: 2 Gender Score: 1      ASSESSMENT AND PLAN: 1. Persistent Atrial Fibrillation (ICD10:  I48.19) The patient's CHA2DS2-VASc score is 8, indicating a 10.8% annual risk of stroke.   S/p TEE/DCCV on 05/17/20 Patient aware of price of dofetilide. Continue Eliquis 5 mg BID, states no missed doses in the last 3 weeks. No recent benadryl use PharmD has screened medications. Patient has  stopped HCTZ and Zofran.  QTc in SR 444-493 ms, OK per Dr Rayann Heman. Labs today show creatinine at  1.1, K+ 4.1 and mag 1.8, CrCl calculated at 39 mL/min Continue Toprol 50 mg daily Continue diltiazem 120 mg daily  2. Secondary Hypercoagulable State (ICD10:  D68.69) The patient is at significant risk for stroke/thromboembolism based upon her CHA2DS2-VASc Score of 8.  Continue Apixaban (Eliquis).   3. HTN Stable, no changes today.  4. CAD Noted on CT No anginal symptoms.  5. Systolic dysfunction  TEE showed EF 35-40% Suspect related to #1 given her EF was normal in October. No signs or symptoms of fluid overload.   To be admitted later today once bed becomes available.    [facility] [address] [place], [place] [address] [phone] 06/20/2020 12:04 PM

## 2020-06-20 NOTE — Progress Notes (Signed)
Pharmacy: Dofetilide (Tikosyn) - Initial Consult Assessment and Electrolyte Replacement  Pharmacy consulted to assist in monitoring and replacing electrolytes in this 87 y.o. female admitted on 06/20/2020 undergoing dofetilide initiation.  Assessment:  Patient Exclusion Criteria: If any screening criteria checked as "Yes", then  patient  should NOT receive dofetilide until criteria item is corrected.  If "Yes" please indicate correction plan.  YES  NO Patient  Exclusion Criteria Correction Plan   [x]   []   Baseline QTc interval is greater than or equal to 440 msec. IF above YES box checked dofetilide contraindicated unless patient has ICD; then may proceed if QTc 500-550 msec or with known ventricular conduction abnormalities may proceed with QTc 550-600 msec. QTc = 444-493 per clinic note Continue with Tikosyn per EP   []   [x]   Patient is known or suspected to have a digoxin level greater than 2 ng/ml: No results found for: DIGOXIN     []   [x]   Creatinine clearance less than 20 ml/min (calculated using Cockcroft-Gault, actual body weight and serum creatinine): Estimated Creatinine Clearance: 32.9 mL/min (A) (by C-G formula based on SCr of 1.1 mg/dL (H)).     []   [x]  Patient has received drugs known to prolong the QT intervals within the last 48 hours (phenothiazines, tricyclics or tetracyclic antidepressants, erythromycin, H-1 antihistamines, cisapride, fluoroquinolones, azithromycin, ondansetron).   Updated information on QT prolonging agents is available to be searched on the following database:QT prolonging agents  pepcid and nexium are conditional risk- will monitor K/mg daily   []   [x]   Patient received a dose of hydrochlorothiazide (O[place]ic) alone or in any combination including triamterene (Dyazide, Maxzide) in the last 48 hours.    []   []  Patient received a medication known to increase dofetilide plasma concentrations prior to initial dofetilide dose:  . Trimethoprim  (Primsol, Proloprim) in the last 36 hours . Verapamil (Calan, Verelan) in the last 36 hours or a sustained release dose in the last 72 hours . Megestrol (Megace) in the last 5 days  . Cimetidine (Tagamet) in the last 6 hours . Ketoconazole (Nizoral) in the last 24 hours . Itraconazole (Sporanox) in the last 48 hours  . Prochlorperazine (Compazine) in the last 36 hours     []   [x]   Patient is known to have a history of torsades de pointes; congenital or acquired long QT syndromes.    []   [x]   Patient has received a Class 1 antiarrhythmic with less than 2 half-lives since last dose. (Disopyramide, Quinidine, Procainamide, Lidocaine, Mexiletine, Flecainide, Propafenone)    []   [x]   Patient has received amiodarone therapy in the past 3 months or amiodarone level is greater than 0.3 ng/ml.    Patient has been appropriately anticoagulated with apixaban.  Labs:    Component Value Date/Time   K 4.1 06/20/2020 1139   MG 1.8 06/20/2020 1139     Plan: Potassium: K >/= 4: Appropriate to initiate Tikosyn, no replacement needed    Magnesium: -Magnesium 2gm IV per EP   Thank you for allowing pharmacy to participate in this patient's care   Hildred Laser, PharmD Clinical Pharmacist **Pharmacist phone directory can now be found on [email or web address] ([email or web address]).  Listed under [place].

## 2020-06-20 NOTE — H&P (Signed)
Primary Care Physician: Celene Squibb, MD Primary Cardiologist: Dr Harl Bowie Primary Electrophysiologist: Dr Rayann Heman Referring Physician: Levell July   Amanda Palmer is a 87 y.o. female with a history of HTN, HLD, COPD, atrial fibrillation who presents for follow up in the [facility]. The patient was initially diagnosed with atrial fibrillation remotely. Patient is on Eliquis for a CHADS2VASC score of 8. She was seen at [facility] ED 04/10/20 with COVID pneumonia. She was also in rapid afib at that time. She was seen by Levell July on 05/05/20 and remained in rapid atrial fibrillation. Diltiazem was started for rate control. She has symptoms of palpitations and heart racing but overall her SOB has improved with resolution of PNA and diuretics. She did miss a dose of Eliquis 05/08/20. Patient is s/p TEE guided DCCV on 05/17/20. TEE showed a reduced EF 35-40%.   On follow up today, patient presents for dofetilide admission. She reports she is doing about the same since her last visit with symptoms of heart racing. She is in rapid afib today. Patient reports she has stopped HCTZ 3 days ago and has not taken any Zofran. She denies any missed doses of anticoagulation.   Today, she denies symptoms of chest pain, orthopnea, PND, lower extremity edema, dizziness, presyncope, syncope, bleeding, or neurologic sequela. The patient is tolerating medications without difficulties and is otherwise without complaint today.    Atrial Fibrillation Risk Factors:  she does not have symptoms or diagnosis of sleep apnea. she does not have a history of rheumatic fever.   she has a BMI of Body mass index is 24.22 kg/m.Marland Kitchen Filed Weights   06/20/20 1305  Weight: 64 kg    Family History  Problem Relation Age of Onset  . Heart disease Brother   . Heart disease Sister   . Breast cancer Sister   . Leukemia Brother   . Breast cancer Sister   . Breast cancer Other        one deceased, one living   . Colon cancer Neg Hx      Atrial Fibrillation Management history:  Previous antiarrhythmic drugs: none Previous cardioversions: 05/17/20 Previous ablations: none CHADS2VASC score: 8 Anticoagulation history: Eliquis   Past Medical History:  Diagnosis Date  . A-fib ([place])   . Anxiety   . Cancer ([place])    breast  . COPD (chronic obstructive pulmonary disease) ([place])   . Depression   . Hypertension   . TIA (transient ischemic attack)    remote   Past Surgical History:  Procedure Laterality Date  . ABDOMINAL HYSTERECTOMY    . APPENDECTOMY    . BACK SURGERY     total of five surgeries  . BREAST BIOPSY Left    benign  . BREAST CAPSULECTOMY WITH IMPLANT EXCHANGE Right 05/27/2016   Procedure: REMOVAL AND REPLACEMENT OF RIGHT BREAST IMPLANT AND BREAST CAPSULECTOMY;  Surgeon: Cristine Polio, MD;  Location: [place];  Service: Plastics;  Laterality: Right;  . CARDIAC CATHETERIZATION    . CARDIOVERSION N/A 05/17/2020   Procedure: CARDIOVERSION;  Surgeon: Acie Fredrickson Wonda Cheng, MD;  Location: [place];  Service: Cardiovascular;  Laterality: N/A;  . CHOLECYSTECTOMY    . COLONOSCOPY  08/2014   Dr. Britta Mccreedy: Small sessile polyp at the cecum, removed, tubular adenoma  . ESOPHAGOGASTRODUODENOSCOPY  06/2013   Dr. Britta Mccreedy: Hiatal hernia, Schatzki ring, nonobstructive.  Marland Kitchen MASTECTOMY     right   . ORIF ANKLE FRACTURE Left 06/01/2014   Procedure:  OPEN REDUCTION INTERNAL FIXATION (ORIF) LEFT ANKLE FRACTURE;  Surgeon: Marybelle Killings, MD;  Location: [place];  Service: Orthopedics;  Laterality: Left;  . ROTATOR CUFF REPAIR     left  . TEE WITHOUT CARDIOVERSION N/A 05/17/2020   Procedure: TRANSESOPHAGEAL ECHOCARDIOGRAM (TEE);  Surgeon: Acie Fredrickson Wonda Cheng, MD;  Location: [place];  Service: Cardiovascular;  Laterality: N/A;  . TOTAL HIP ARTHROPLASTY     right  . TOTAL KNEE ARTHROPLASTY     bilateral    Current Facility-Administered Medications  Medication Dose Route Frequency  Provider Last Rate Last Admin  . 0.9 %  sodium chloride infusion  250 mL Intravenous PRN Fenton, Clint R, PA      . albuterol (PROVENTIL) (2.5 MG/3ML) 0.083% nebulizer solution 2.5 mg  2.5 mg Nebulization Q6H PRN Fenton, Clint R, PA      . apixaban (ELIQUIS) tablet 5 mg  5 mg Oral BID Fenton, Clint R, PA      . atorvastatin (LIPITOR) tablet 20 mg  20 mg Oral QHS Fenton, Clint R, PA      . clonazePAM (KLONOPIN) tablet 1 mg  1 mg Oral QHS Fenton, Clint R, PA      . [START ON 06/21/2020] diltiazem (CARDIZEM CD) 24 hr capsule 120 mg  120 mg Oral Daily Fenton, Clint R, PA      . dofetilide (TIKOSYN) capsule 125 mcg  125 mcg Oral BID Fenton, Clint R, PA      . famotidine (PEPCID) tablet 20 mg  20 mg Oral QHS Fenton, Clint R, PA      . feeding supplement (ENSURE ENLIVE / ENSURE PLUS) liquid 237 mL  237 mL Oral BID BM Yonatan Guitron, Jeneen Rinks, MD      . Derrill Memo ON 06/21/2020] fluticasone furoate-vilanterol (BREO ELLIPTA) 100-25 MCG/INH 1 puff  1 puff Inhalation Daily Karlos Scadden, Jeneen Rinks, MD      . Derrill Memo ON 06/21/2020] furosemide (LASIX) tablet 20 mg  20 mg Oral Daily Fenton, Clint R, PA      . [START ON 06/21/2020] losartan (COZAAR) tablet 50 mg  50 mg Oral Daily Fenton, Clint R, PA      . magnesium sulfate IVPB 2 g 50 mL  2 g Intravenous Once Baldwin Jamaica, PA-C      . [START ON 06/21/2020] metoprolol succinate (TOPROL-XL) 24 hr tablet 50 mg  50 mg Oral Daily Fenton, Clint R, PA      . [START ON 06/21/2020] pantoprazole (PROTONIX) EC tablet 40 mg  40 mg Oral Daily Fenton, Clint R, PA      . [START ON 06/21/2020] potassium chloride (KLOR-CON) CR tablet 20 mEq  20 mEq Oral Daily Fenton, Clint R, PA      . sodium chloride flush (NS) 0.9 % injection 3 mL  3 mL Intravenous Q12H Fenton, Clint R, PA      . sodium chloride flush (NS) 0.9 % injection 3 mL  3 mL Intravenous PRN Fenton, Clint R, PA      . [START ON 06/21/2020] umeclidinium bromide (INCRUSE ELLIPTA) 62.5 MCG/INH 1 puff  1 puff Inhalation Daily Obinna Ehresman, Jeneen Rinks, MD      .  Derrill Memo ON 06/21/2020] venlafaxine (EFFEXOR) tablet 150 mg  150 mg Oral Daily Fenton, Clint R, PA      . venlafaxine (EFFEXOR) tablet 75 mg  75 mg Oral QHS Laretta Pyatt, Jeneen Rinks, MD        Allergies  Allergen Reactions  . Morphine And Related Nausea And Vomiting  . Penicillins Rash  Reaction: unknown  . Sulfa Antibiotics Nausea And Vomiting    Social History   Socioeconomic History  . Marital status: Widowed    Spouse name: Not on file  . Number of children: Not on file  . Years of education: Not on file  . Highest education level: Not on file  Occupational History  . Not on file  Tobacco Use  . Smoking status: Never Smoker  . Smokeless tobacco: Never Used  Vaping Use  . Vaping Use: Never used  Substance and Sexual Activity  . Alcohol use: No    Alcohol/week: 0.0 standard drinks  . Drug use: No  . Sexual activity: Not on file  Other Topics Concern  . Not on file  Social History Narrative  . Not on file   Social Determinants of Health   Financial Resource Strain: Not on file  Food Insecurity: Not on file  Transportation Needs: Not on file  Physical Activity: Not on file  Stress: Not on file  Social Connections: Not on file  Intimate Partner Violence: Not on file     ROS- All systems are reviewed and negative except as per the HPI above.  Physical Exam: Vitals:   06/20/20 1305 06/20/20 1517  BP: 130/75 113/65  Pulse: (!) 131 (!) 110  Resp: 18 18  Temp: 98.1 F (36.7 C) 98 F (36.7 C)  TempSrc: Oral Oral  SpO2: 100% 100%  Weight: 64 kg     GEN- The patient is a well appearing elderly female, alert and oriented x 3 today.   HEENT-head normocephalic, atraumatic, sclera clear, conjunctiva pink, hearing intact, trachea midline. Lungs- Clear to ausculation bilaterally, normal work of breathing Heart- irregular rate and rhythm, no murmurs, rubs or gallops  GI- soft, NT, ND, + BS Extremities- no clubbing, cyanosis, or edema MS- no significant deformity or  atrophy Skin- no rash or lesion Psych- euthymic mood, full affect Neuro- strength and sensation are intact   Wt Readings from Last 3 Encounters:  06/20/20 64 kg  06/20/20 64.5 kg  05/26/20 62.1 kg    EKG today demonstrates  Afib LAFB Vent. rate 130 BPM PR interval * ms QRS duration 86 ms QT/QTcB 332/488 ms  Echo 12/30/19 demonstrated  1. Left ventricular ejection fraction, by estimation, is 60 to 65%. The  left ventricle has normal function. The left ventricle has no regional  wall motion abnormalities. Left ventricular diastolic parameters are  consistent with Grade II diastolic  dysfunction (pseudonormalization).  2. Right ventricular systolic function is normal. The right ventricular  size is normal. There is mildly to moderately elevated pulmonary artery  systolic pressure. The estimated right ventricular systolic pressure is  35.0 mmHg.  3. Left atrial size was moderately dilated.  4. The mitral valve is abnormal. Moderate mitral valve regurgitation.  5. The aortic valve is tricuspid. Aortic valve regurgitation is not  visualized. Mild aortic valve sclerosis is present, with no evidence of  aortic valve stenosis.  6. The inferior vena cava is normal in size with greater than 50%  respiratory variability, suggesting right atrial pressure of 3 mmHg.   Epic records are reviewed at length today  CHA2DS2-VASc Score = 8  The patient's score is based upon: CHF History: Yes HTN History: Yes Diabetes History: No Stroke History: Yes Vascular Disease History: Yes Age Score: 2 Gender Score: 1      ASSESSMENT AND PLAN: 1. Persistent Atrial Fibrillation (ICD10:  I48.19) The patient's CHA2DS2-VASc score is 8, indicating a 10.8% annual  risk of stroke.   S/p TEE/DCCV on 05/17/20 Patient aware of price of dofetilide. Continue Eliquis 5 mg BID, states no missed doses in the last 3 weeks. No recent benadryl use PharmD has screened medications. Patient has stopped HCTZ  and Zofran.  QTc in SR 444-493 ms, OK per Dr Rayann Heman. Labs today show creatinine at 1.1, K+ 4.1 and mag 1.8, CrCl calculated at 39 mL/min Continue Toprol 50 mg daily Continue diltiazem 120 mg daily  2. Secondary Hypercoagulable State (ICD10:  D68.69) The patient is at significant risk for stroke/thromboembolism based upon her CHA2DS2-VASc Score of 8.  Continue Apixaban (Eliquis).   3. HTN Stable, no changes today.  4. CAD Noted on CT No anginal symptoms.  5. Systolic dysfunction  TEE showed EF 35-40% Suspect related to #1 given her EF was normal in October. No signs or symptoms of fluid overload.   To be admitted later today once bed becomes available.    [facility] [address] [place], [place] [address] [phone] 06/20/2020 4:38 PM    I have seen, examined the patient, and reviewed the above assessment and plan.  Changes to above are made where necessary.  On exam, iRRR.  We will admit for initiation of tikosyn.  Our options are limited.  Not an ablation candidate.  She reports compliance with eliquis without interruption.  Co Sign: Thompson Grayer, MD 06/20/2020 4:39 PM

## 2020-06-20 NOTE — Care Management (Signed)
06-20-20 1403 Benefits check submitted for Tikosyn. Case Manager will follow for cost and pharmacy of choice. Graves-Bigelow, Ocie Cornfield, RN, BSN Case Manager

## 2020-06-21 DIAGNOSIS — I4819 Other persistent atrial fibrillation: Secondary | ICD-10-CM | POA: Diagnosis not present

## 2020-06-21 LAB — BASIC METABOLIC PANEL
Anion gap: 6 (ref 5–15)
BUN: 21 mg/dL (ref 8–23)
CO2: 29 mmol/L (ref 22–32)
Calcium: 9.6 mg/dL (ref 8.9–10.3)
Chloride: 100 mmol/L (ref 98–111)
Creatinine, Ser: 0.95 mg/dL (ref 0.44–1.00)
GFR, Estimated: 59 mL/min — ABNORMAL LOW (ref >60)
Glucose, Bld: 103 mg/dL — ABNORMAL HIGH (ref 70–99)
Potassium: 4.4 mmol/L (ref 3.5–5.1)
Sodium: 135 mmol/L (ref 135–145)

## 2020-06-21 LAB — MAGNESIUM: Magnesium: 2.3 mg/dL (ref 1.7–2.4)

## 2020-06-21 NOTE — Progress Notes (Addendum)
Progress Note  Patient Name: Amanda Palmer Date of Encounter: 06/21/2020  [facility] Cardiologist: Dr. Harl Bowie  Subjective   sleeping as we enter, wakes easily, feels well  Inpatient Medications    Scheduled Meds: . apixaban  5 mg Oral BID  . atorvastatin  20 mg Oral QHS  . clonazePAM  1 mg Oral QHS  . diltiazem  120 mg Oral Daily  . dofetilide  125 mcg Oral BID  . famotidine  20 mg Oral QHS  . feeding supplement  237 mL Oral BID BM  . fluticasone furoate-vilanterol  1 puff Inhalation Daily  . furosemide  20 mg Oral Daily  . losartan  50 mg Oral Daily  . metoprolol succinate  50 mg Oral Daily  . pantoprazole  40 mg Oral Daily  . potassium chloride  20 mEq Oral Daily  . sodium chloride flush  3 mL Intravenous Q12H  . umeclidinium bromide  1 puff Inhalation Daily  . venlafaxine  150 mg Oral Daily  . venlafaxine  75 mg Oral QHS   Continuous Infusions: . sodium chloride     PRN Meds: sodium chloride, albuterol, sodium chloride flush   Vital Signs    Vitals:   06/20/20 2019 06/21/20 0014 06/21/20 0322 06/21/20 0521  BP: 125/72 126/76 136/72 135/71  Pulse: (!) 121  (!) 117 (!) 109  Resp: 20 19 19 20   Temp: 97.9 F (36.6 C) 98.2 F (36.8 C) 97.8 F (36.6 C) 97.7 F (36.5 C)  TempSrc: Oral Oral Oral Oral  SpO2: 100% 100% 100% 100%  Weight:   64 kg   Height:   5\' 4"  (1.626 m)     Intake/Output Summary (Last 24 hours) at 06/21/2020 0900 Last data filed at 06/21/2020 0330 Gross per 24 hour  Intake 42.23 ml  Output 850 ml  Net -807.77 ml   Last 3 Weights 06/21/2020 06/20/2020 06/20/2020  Weight (lbs) 141 lb 1.6 oz 141 lb 1.6 oz 141 lb 1.6 oz  Weight (kg) 64.003 kg 64.003 kg 64.003 kg      Telemetry    AFib 120's, some slower and faster intermittently - Personally Reviewed  ECG    AFib 120bpm/125bpm, QT is difficult in RVR, reviewed with Dr. Rayann Heman, both EKGs QT/QTc looks stable  Physical Exam   GEN: No acute distress.   Neck: No JVD Cardiac:  irreg-irreg, tachycardic, no murmurs, rubs, or gallops.  Respiratory: CTA b/l GI: Soft, nontender, non-distended  MS: No edema; No deformity. Neuro:  hard of hearing otherwise Nonfocal Psych: Normal affect   Labs    High Sensitivity Troponin:  No results for input(s): TROPONINIHS in the last 720 hours.    Chemistry Recent Labs  Lab 06/20/20 1139 06/21/20 0258  NA 135 135  K 4.1 4.4  CL 101 100  CO2 27 29  GLUCOSE 119* 103*  BUN 22 21  CREATININE 1.10* 0.95  CALCIUM 9.4 9.6  GFRNONAA 50* 59*  ANIONGAP 7 6     HematologyNo results for input(s): WBC, RBC, HGB, HCT, MCV, MCH, MCHC, RDW, PLT in the last 168 hours.  BNPNo results for input(s): BNP, PROBNP in the last 168 hours.   DDimer No results for input(s): DDIMER in the last 168 hours.   Radiology    No results found.  Cardiac Studies    05/17/20: TEE IMPRESSIONS  1. Left ventricular ejection fraction, by estimation, is 30 to 35%. The  left ventricle has moderately decreased function.  2. Right ventricular systolic  function is normal. The right ventricular  size is normal.  3. Left atrial size was severely dilated. No left atrial/left atrial  appendage thrombus was detected.  4. The mitral valve is normal in structure. Moderate mitral valve  regurgitation.  5. Tricuspid valve regurgitation is moderate to severe.  6. The aortic valve is normal in structure. Aortic valve regurgitation is  not visualized.   Patient Profile     87 y.o. female w/PMHx of COPD (on home O2), HTN, HLD, AFib, NICM, TIA, admitted for tikosyn initiation  Assessment & Plan    1. Persistent AFib     CHA2DS2Vasc is 7, on Eliquis, appropriately dosed     tikosyn load is in progress     K+ 4.4     Mag 2.3     Creat 0.95 (stable)     EKGs reviewed with Dr. Rayann Heman, AT appears stable to continue   DCCV tomorrow if not in SR Discussed with the patient procedure, risks and benefits, she is agreeable  2. NICM     Does not appear  acutely volume OL     Cont home meds  3. HTN     Looks OK  4. Chronic lung disease     O2 dep COPD     No wheezing       For questions or updates, please contact [place] Please consult [email or web address] for contact info under        Signed, Baldwin Jamaica, PA-C  06/21/2020, 9:00 AM      I have seen, examined the patient, and reviewed the above assessment and plan.  Changes to above are made where necessary.  On exam, iRRR.  Continue loading tikosyn.  Remains in AF.  Qt is stable.  Co Sign: Thompson Grayer, MD

## 2020-06-21 NOTE — Progress Notes (Signed)
Initial Nutrition Assessment DOCUMENTATION CODES:   Not applicable  INTERVENTION:  Continue Ensure Enlive po BID, each supplement provides 350 kcal and 20 grams of protein  Consider diet liberalization if pt's intake is poor  NUTRITION DIAGNOSIS:  Inadequate oral intake related to decreased appetite as evidenced by per patient/family report.  GOAL:  Patient will meet greater than or equal to 90% of their needs  MONITOR:  PO intake,Supplement acceptance  REASON FOR ASSESSMENT:  Malnutrition Screening Tool    ASSESSMENT:  Pt admitted for dofetilide administration. PMH relevant for breast cancer, HTN, COPD, TIA, atrial fibrillation  Pt napping at the time of visit, woke to name being called. Pt endorses that she has been eating less for several months, complains of early satiety at meals. However, thinks she is starting to improve. Reports a UBW of 140 lb - which is her current recorded weight. Will request new measured to monitor for weight loss. Pt reports intake has been good since admission, no meal intake recorded in flowsheet. Pt received an ensure yesterday, states she did enjoy it and would continue drinking them. At baseline, lives at home alone, would like to talk to a case manger about having someone come into her home a few hours a day to assist with meals and light housework. Informed CM. Marland Kitchen  Medications reviewed and include:  Lasix  KCl  Labs reviewed  NUTRITION - FOCUSED PHYSICAL EXAM: Flowsheet Row Most Recent Value  Orbital Region No depletion  Upper Arm Region No depletion  Thoracic and Lumbar Region No depletion  Buccal Region No depletion  Temple Region No depletion  Clavicle Bone Region No depletion  Clavicle and Acromion Bone Region Mild depletion  Scapular Bone Region No depletion  Dorsal Hand Mild depletion  Patellar Region No depletion  Anterior Thigh Region No depletion  Posterior Calf Region Mild depletion  Edema (RD Assessment) None  Hair  Reviewed  Eyes Reviewed  Mouth Reviewed  Skin Reviewed  Nails Reviewed     Diet Order:   Diet Order            Diet NPO time specified Except for: Sips with Meds  Diet effective midnight           Diet Heart Room service appropriate? Yes; Fluid consistency: Thin  Diet effective now                EDUCATION NEEDS:  No education needs have been identified at this time  Skin:  Skin Assessment: Reviewed RN Assessment  Last BM:  4/12 per RN documentation  Height:  Ht Readings from Last 1 Encounters:  06/21/20 5\' 4"  (1.626 m)   Weight:  Wt Readings from Last 1 Encounters:  06/21/20 64 kg    Ideal Body Weight:  54.5 kg  BMI:  Body mass index is 24.22 kg/m.  Estimated Nutritional Needs:   Kcal:  1500-1700 kcal  Protein:  75-85 g  Fluid:  >1500 mL/d  Ranell Patrick, RD, LDN Clinical Dietitian Pager on Amion

## 2020-06-21 NOTE — Progress Notes (Signed)
Pharmacy: Dofetilide (Tikosyn) - Follow Up Assessment and Electrolyte Replacement  Pharmacy consulted to assist in monitoring and replacing electrolytes in this 87 y.o. female admitted on 06/20/2020 undergoing dofetilide initiation.   Labs:    Component Value Date/Time   K 4.4 06/21/2020 0258   MG 2.3 06/21/2020 0258     Plan: Potassium: K >/= 4: No additional supplementation needed  Magnesium: Mg > 2: No additional supplementation needed   As patient has required on average 20 mEq of potassium replacement every day  Thank you for allowing pharmacy to participate in this patient's care   Hildred Laser, PharmD Clinical Pharmacist **Pharmacist phone directory can now be found on [email or web address] ([email or web address]).  Listed under [place].

## 2020-06-21 NOTE — Evaluation (Signed)
Occupational Therapy Evaluation Patient Details Name: Amanda Palmer MRN: [id number] DOB: 06-15-1933 Today's Date: 06/21/2020    History of Present Illness Pt is a 87 y.o. female with a history of HTN, HLD, COPD, atrial fibrillation who presents for follow up in the [facility]. She was seen at [facility] ED 04/10/20 with COVID pneumonia. She was also in rapid afib at that time. She has symptoms of palpitations and heart racing but overall her SOB has improved with resolution of PNA and diuretics.   Clinical Impression   Pt admitted to ED due to concerns reported above. PTA pt reported being independent with all  ADL's and most IADL's. Pt's niece assists with grocery shopping and driving her to appointments. At the time of the evaluation, pt required min guard for safety with all ADL's and functional mobility. Pt reported that she would like a caregiver to come a few hours a day to assist with cooking and cleaning and other needs, due to her level of fatigue and requiring frequent rest breaks. Pt addresses her deficits and is inquisitive about energy conservation techniques to help her. Pt will benefit from OT and will be followed acutely.     Follow Up Recommendations  Home health OT;Supervision - Intermittent    Equipment Recommendations  None recommended by OT    Recommendations for Other Services       Precautions / Restrictions Precautions Precautions: Fall Restrictions Weight Bearing Restrictions: No      Mobility Bed Mobility Overal bed mobility: Modified Independent             General bed mobility comments: Pt able to get out of bed using railings and HOB slightly raised.    Transfers Overall transfer level: Needs assistance Equipment used: Rolling walker (2 wheeled) Transfers: Sit to/from Stand Sit to Stand: Min guard         General transfer comment: Pt needs min guard for safety when sitting down and initially standing while she  catches her footing.    Balance Overall balance assessment: Needs assistance Sitting-balance support: Feet supported;No upper extremity supported Sitting balance-Leahy Scale: Good     Standing balance support: Bilateral upper extremity supported;During functional activity;Single extremity supported Standing balance-Leahy Scale: Fair Standing balance comment: Pt maintains static standing with bilater support on RW and when completing dynamic standing at the sink, pt will keep 1 hand on the sink for support.                           ADL either performed or assessed with clinical judgement   ADL Overall ADL's : Needs assistance/impaired Eating/Feeding: Set up;Sitting Eating/Feeding Details (indicate cue type and reason): Pt needs assist with opening soda cans and smaller containers. She has no difficulty feeding herself Grooming: Oceanographer;Wash/dry face;Oral care Grooming Details (indicate cue type and reason): Pt can complete all grooming standing at the sink with unilateral support Upper Body Bathing: Min guard;Sitting   Lower Body Bathing: Min guard;Sitting/lateral leans;Sit to/from stand Lower Body Bathing Details (indicate cue type and reason): Pt able to stand and complete pericare with support/assistance for balance/safety Upper Body Dressing : Modified independent;Sitting Upper Body Dressing Details (indicate cue type and reason): Pt donned and doffed robe sitting EOB Lower Body Dressing: Supervision/safety Lower Body Dressing Details (indicate cue type and reason): Pt able to don and doff shoes and socks in figure 4 position sitting eob Toilet Transfer: Magazine features editor Details (indicate  cue type and reason): Cueing for safety Toileting- Clothing Manipulation and Hygiene: Supervision/safety;Sit to/from stand;Sitting/lateral lean Toileting - Clothing Manipulation Details (indicate cue type and reason): Pt able to pull up briefs and complete toileting  hygiene with supervision when standing for safety.     Functional mobility during ADLs: Min guard;Rolling walker General ADL Comments: Pt is able to complete all ADL's with min guard to intermittent supervision for safety.     Vision Baseline Vision/History: Wears glasses Wears Glasses: At all times Patient Visual Report: No change from baseline Vision Assessment?: No apparent visual deficits     Perception Perception Perception Tested?: No   Praxis Praxis Praxis tested?: Not tested    Pertinent Vitals/Pain Pain Assessment: No/denies pain     Hand Dominance Right   Extremity/Trunk Assessment Upper Extremity Assessment Upper Extremity Assessment: Overall WFL for tasks assessed       Cervical / Trunk Assessment Cervical / Trunk Assessment: Normal   Communication Communication Communication: No difficulties   Cognition Arousal/Alertness: Awake/alert Behavior During Therapy: WFL for tasks assessed/performed Overall Cognitive Status: Within Functional Limits for tasks assessed                                     General Comments  VSS on 3L O2. HR stayed in the 80s and O2 stayed at 100% throughout    Exercises     Shoulder Instructions      Home Living Family/patient expects to be discharged to:: Private residence Living Arrangements: Alone Available Help at Discharge: Family;Available PRN/intermittently Type of Home: Apartment Home Access: Level entry     Home Layout: One level     Bathroom Shower/Tub: Teacher, early years/pre: Handicapped height Bathroom Accessibility: Yes How Accessible: Accessible via walker Home Equipment: Snook - 4 wheels;Cane - single point;Bedside commode;Tub bench;Grab bars - toilet;Grab bars - tub/shower;Hand held shower head;Wheelchair - manual          Prior Functioning/Environment Level of Independence: Independent with assistive device(s)        Comments: Pt reported requiring minimal  assistance when recovering from covid, however, PTA pt reported being independent with all ADL's and home managment/cooking.        OT Problem List: Decreased strength;Decreased activity tolerance;Impaired balance (sitting and/or standing);Decreased coordination;Decreased safety awareness;Decreased knowledge of use of DME or AE      OT Treatment/Interventions: Self-care/ADL training;Therapeutic exercise;Energy conservation;DME and/or AE instruction;Therapeutic activities;Cognitive remediation/compensation;Patient/family education;Balance training    OT Goals(Current goals can be found in the care plan section) Acute Rehab OT Goals Patient Stated Goal: To go home and to hire a caregiver a few hours a week. OT Goal Formulation: With patient Time For Goal Achievement: 07/05/20 Potential to Achieve Goals: Good ADL Goals Pt Will Perform Lower Body Bathing: with modified independence;sitting/lateral leans;sit to/from stand Pt Will Perform Lower Body Dressing: with modified independence;sit to/from stand;sitting/lateral leans Pt Will Transfer to Toilet: with modified independence;ambulating;regular height toilet Pt Will Perform Toileting - Clothing Manipulation and hygiene: with modified independence;sitting/lateral leans;sit to/from stand Additional ADL Goal #1: Pt will identify 3 energy conservation techniques.  OT Frequency: Min 2X/week   Barriers to D/C:            Co-evaluation              AM-PAC OT "6 Clicks" Daily Activity     Outcome Measure Help from another person eating meals?: A Little Help from  another person taking care of personal grooming?: A Little Help from another person toileting, which includes using toliet, bedpan, or urinal?: A Little Help from another person bathing (including washing, rinsing, drying)?: A Little Help from another person to put on and taking off regular upper body clothing?: A Little Help from another person to put on and taking off regular  lower body clothing?: A Little 6 Click Score: 18   End of Session Equipment Utilized During Treatment: Gait belt;Rolling walker Nurse Communication: Mobility status;Other (comment) (O2 and HR status with movement)  Activity Tolerance: Patient tolerated treatment well Patient left: in chair;with call bell/phone within reach;with nursing/sitter in room  OT Visit Diagnosis: Unsteadiness on feet (R26.81);Other abnormalities of gait and mobility (R26.89);Muscle weakness (generalized) (M62.81)                Time: 3887-1959 OT Time Calculation (min): 41 min Charges:  OT General Charges $OT Visit: 1 Visit OT Evaluation $OT Eval Moderate Complexity: 1 Mod OT Treatments $Self Care/Home Management : 23-37 mins  Sabra Sessler H., OTR/L Acute Rehabilitation  Cyrus [place] Elane Rozalynn Buege 06/21/2020, 4:26 PM

## 2020-06-21 NOTE — Progress Notes (Signed)
Patient back in NSR at 1523.  EKG confirmed.  Tommye Standard, PA notified and aware.

## 2020-06-21 NOTE — Progress Notes (Addendum)
Patient had 7 beats VT, followed by smaller runs of 4-5 beats.   Tommye Standard, PA notified and aware, no new orders received.

## 2020-06-22 ENCOUNTER — Encounter (HOSPITAL_COMMUNITY)
Admission: AD | Disposition: A | Discharge status: Home / Self Care | Payer: Self-pay | Source: Ambulatory Visit | Attending: Internal Medicine

## 2020-06-22 DIAGNOSIS — I4819 Other persistent atrial fibrillation: Secondary | ICD-10-CM | POA: Diagnosis not present

## 2020-06-22 LAB — BASIC METABOLIC PANEL
Anion gap: 5 (ref 5–15)
BUN: 27 mg/dL — ABNORMAL HIGH (ref 8–23)
CO2: 31 mmol/L (ref 22–32)
Calcium: 9.8 mg/dL (ref 8.9–10.3)
Chloride: 98 mmol/L (ref 98–111)
Creatinine, Ser: 1.02 mg/dL — ABNORMAL HIGH (ref 0.44–1.00)
GFR, Estimated: 54 mL/min — ABNORMAL LOW (ref >60)
Glucose, Bld: 109 mg/dL — ABNORMAL HIGH (ref 70–99)
Potassium: 4.6 mmol/L (ref 3.5–5.1)
Sodium: 134 mmol/L — ABNORMAL LOW (ref 135–145)

## 2020-06-22 LAB — CBC
HCT: 29.4 % — ABNORMAL LOW (ref 36.0–46.0)
Hemoglobin: 8.8 g/dL — ABNORMAL LOW (ref 12.0–15.0)
MCH: 27.8 pg (ref 26.0–34.0)
MCHC: 29.9 g/dL — ABNORMAL LOW (ref 30.0–36.0)
MCV: 93 fL (ref 80.0–100.0)
Platelets: 202 10*3/uL (ref 150–400)
RBC: 3.16 MIL/uL — ABNORMAL LOW (ref 3.87–5.11)
RDW: 16.7 % — ABNORMAL HIGH (ref 11.5–15.5)
WBC: 5.2 10*3/uL (ref 4.0–10.5)
nRBC: 0 % (ref 0.0–0.2)

## 2020-06-22 LAB — MAGNESIUM: Magnesium: 2.1 mg/dL (ref 1.7–2.4)

## 2020-06-22 SURGERY — CARDIOVERSION
Anesthesia: General

## 2020-06-22 NOTE — Progress Notes (Signed)
Patient had requested AD information. Chaplain took it to her room. Patient seemed to have trouble understanding what I was talking about. Chaplain asked her to share the packet with her family and have the nurse contact the Chaplain if there were questions and when she was ready to complete the documents.    06/22/20 1100  Clinical Encounter Type  Visited With Patient  Visit Type Other (Comment)  Referral From Nurse  Consult/Referral To Chaplain

## 2020-06-22 NOTE — Progress Notes (Signed)
Pharmacy: Dofetilide (Tikosyn) - Follow Up Assessment and Electrolyte Replacement  Pharmacy consulted to assist in monitoring and replacing electrolytes in this 87 y.o. female admitted on 06/20/2020 undergoing dofetilide initiation.   Labs:    Component Value Date/Time   K 4.6 06/22/2020 0332   MG 2.1 06/22/2020 0332     Plan: Potassium: K >/= 4: No additional supplementation needed  Magnesium: Mg > 2: No additional supplementation needed   As patient has required on average 20 mEq of potassium replacement every day, recommend discharging patient with prescription for:  Potassium chloride 20 mEq  daily  Thank you for allowing pharmacy to participate in this patient's care   Hildred Laser, PharmD Clinical Pharmacist **Pharmacist phone directory can now be found on [email or web address] ([email or web address]).  Listed under [place].

## 2020-06-22 NOTE — Care Management (Signed)
06-22-20 1557 Case manager spoke with patient regarding Tikosyn Load- Patient wants to get the first Rx filled via [facility] and then the refills to be e-scribed to [facility]. Case Manager will continue to follow for additional transition of care needs. Bethena Roys, RN,BSN Case Manager

## 2020-06-22 NOTE — Progress Notes (Signed)
Commotion heard from pt room. Pt found sitting on floor with back against wall/hepa filter. Per pt, she had gotten oob to closet and was bracing herself against arm rest of BSC, which slipped out from under her causing her to lose her balance and fall. Denies hitting head. C/O hitting her back when she fell. Sm area of bruise/swelling noted on lower back and outlined with penmark. ICE placed on area of swelling. Sande Rives, PA notified. Evaluated by PA. CBC ordered.

## 2020-06-22 NOTE — Progress Notes (Addendum)
Progress Note  Patient Name: Amanda Palmer Date of Encounter: 06/22/2020  [facility] Cardiologist: Dr. Harl Bowie  Subjective   Breathing well this morning, thinks she can tell her heart rhythm is better, no CP  Inpatient Medications    Scheduled Meds: . apixaban  5 mg Oral BID  . atorvastatin  20 mg Oral QHS  . clonazePAM  1 mg Oral QHS  . diltiazem  120 mg Oral Daily  . dofetilide  125 mcg Oral BID  . famotidine  20 mg Oral QHS  . feeding supplement  237 mL Oral BID BM  . fluticasone furoate-vilanterol  1 puff Inhalation Daily  . furosemide  20 mg Oral Daily  . losartan  50 mg Oral Daily  . metoprolol succinate  50 mg Oral Daily  . pantoprazole  40 mg Oral Daily  . potassium chloride  20 mEq Oral Daily  . sodium chloride flush  3 mL Intravenous Q12H  . umeclidinium bromide  1 puff Inhalation Daily  . venlafaxine  150 mg Oral Daily  . venlafaxine  75 mg Oral QHS   Continuous Infusions: . sodium chloride     PRN Meds: sodium chloride, albuterol, sodium chloride flush   Vital Signs    Vitals:   06/21/20 1735 06/21/20 2023 06/21/20 2330 06/22/20 0348  BP: 122/76  (!) 118/56 116/63  Pulse: 81 81 77 79  Resp: 17 16 16 18   Temp: 97.9 F (36.6 C) 98.7 F (37.1 C) 98.3 F (36.8 C) 97.8 F (36.6 C)  TempSrc: Oral Oral Oral Oral  SpO2: 100% 100% 100% 93%  Weight:    64.8 kg  Height:        Intake/Output Summary (Last 24 hours) at 06/22/2020 0758 Last data filed at 06/21/2020 2031 Gross per 24 hour  Intake --  Output 350 ml  Net -350 ml   Last 3 Weights 06/22/2020 06/21/2020 06/20/2020  Weight (lbs) 142 lb 14.4 oz 141 lb 1.6 oz 141 lb 1.6 oz  Weight (kg) 64.819 kg 64.003 kg 64.003 kg      Telemetry    AFib 120's >> SR 70's - Personally Reviewed  ECG    SR 77bpm, QTc 433ms, personally reviewed  Physical Exam   GEN: No acute distress.   Neck: No JVD Cardiac: RRR, no murmurs, rubs, or gallops.  Respiratory: CTA b/l, moving air well, no wheezing GI:  Soft, nontender, non-distended  MS: No edema; No deformity. Neuro:  hard of hearing otherwise Nonfocal Psych: Normal affect   Labs    High Sensitivity Troponin:  No results for input(s): TROPONINIHS in the last 720 hours.    Chemistry Recent Labs  Lab 06/20/20 1139 06/21/20 0258 06/22/20 0332  NA 135 135 134*  K 4.1 4.4 4.6  CL 101 100 98  CO2 27 29 31   GLUCOSE 119* 103* 109*  BUN 22 21 27*  CREATININE 1.10* 0.95 1.02*  CALCIUM 9.4 9.6 9.8  GFRNONAA 50* 59* 54*  ANIONGAP 7 6 5      HematologyNo results for input(s): WBC, RBC, HGB, HCT, MCV, MCH, MCHC, RDW, PLT in the last 168 hours.  BNPNo results for input(s): BNP, PROBNP in the last 168 hours.   DDimer No results for input(s): DDIMER in the last 168 hours.   Radiology    No results found.  Cardiac Studies    05/17/20: TEE IMPRESSIONS  1. Left ventricular ejection fraction, by estimation, is 30 to 35%. The  left ventricle has moderately decreased function.  2. Right ventricular systolic function is normal. The right ventricular  size is normal.  3. Left atrial size was severely dilated. No left atrial/left atrial  appendage thrombus was detected.  4. The mitral valve is normal in structure. Moderate mitral valve  regurgitation.  5. Tricuspid valve regurgitation is moderate to severe.  6. The aortic valve is normal in structure. Aortic valve regurgitation is  not visualized.   Patient Profile     87 y.o. female w/PMHx of COPD (on home O2), HTN, HLD, AFib, NICM, TIA, admitted for tikosyn initiation  Assessment & Plan    1. Persistent AFib     CHA2DS2Vasc is 7, on Eliquis, appropriately dosed     tikosyn load is in progress     K+ 4.6     Mag 2.1     Creat 1.02 (stable)     QTc stable  Anticipate discharge tomorrow   2. NICM     Does not appear acutely volume OL     Cont home meds  3. HTN     Looks OK  4. Chronic lung disease     O2 dep COPD     No wheezing     Walked with PT yesterday  did well, O2 sats stable 100% pon her home O2 of 3L       5. Nursing concerns of functional status     PT/OT yesterday     D/w tech, she required minimal if any assist and did very well, uses walker/cane at baseline, has at home, no other equipment recs       For questions or updates, please contact [place] Please consult [email or web address] for contact info under        Signed, Baldwin Jamaica, PA-C  06/22/2020, 7:58 AM      I have seen, examined the patient, and reviewed the above assessment and plan.  Changes to above are made where necessary.  On exam, RRR.  Elderly and frail.  She has converted to sinus.  QT is stable.  Continue to monitor.  Hopefully home tomorrow.  Co Sign: Thompson Grayer, MD 06/22/2020

## 2020-06-22 NOTE — Progress Notes (Signed)
   Received page around 5:40pm that patient had suffered a mechanical fall and fell on her bottom/back and now has a spinal hematoma. Went to bedside. Patient states she was feeling great so she got up to get something out of the cabinet. She put her weight on the bedside commode and it did not hold her. She slipped and fell on her bottom/back. She is adamant that she never hit her head on anything. I examined her back. She has some very mild erythema to the left of her lower spine but no ecchymosis. Slight firm to the touch but not significant at all. She does have some tenderness to palpation of this area. Denies any neurological symptoms - no weakness, numbness, tingling of legs. We will mark the area so we know if it enlarges. Also recommend applying ice to the area for 20 minutes. I do not think we need any imaging of her spine right now but advised patient to let us know if she develops and neurologic symptoms or is having significant pain. Will get a CBC given patient is on Eliquis. Asked RN to notify overnight provider if hemoglobin is significantly lower than baseline. Also signed out to overnight provider (Dr. Renella Cunas).   Darreld Mclean, PA-C 06/22/2020 8:18 PM

## 2020-06-22 NOTE — Evaluation (Signed)
Physical Therapy Evaluation Patient Details Name: Amanda Palmer MRN: [id number] DOB: 04/19/1933 Today's Date: 06/22/2020   History of Present Illness  87 y.o. female presents to [facility] on 06/20/2020 for dofetilide admission for persistent Afib. PMH includes TN, HLD, COPD.  Clinical Impression  Pt presents to PT with deficits in activity tolerance, endurance, gait, and device management. Pt fatigues quickly with mobility and does bump into objects with use of RW in hallway during session. Pt ambulates with use of 4 wheeled walker at baseline and device management may be related to differences in familiarity of device. Pt is currently able to ambulate household distances but will benefit from continued acute PT services to improve activity tolerance and to provide education on energy conservation strategies. PT recommends HHPT services at this time.    Follow Up Recommendations Home health PT (may progress to no needs)    Equipment Recommendations  Hospital bed    Recommendations for Other Services       Precautions / Restrictions Precautions Precautions: Fall Precaution Comments: Monitor oxygen levels Restrictions Weight Bearing Restrictions: No      Mobility  Bed Mobility Overal bed mobility: Modified Independent             General bed mobility comments: Pt performed bed mobility with use of railings. HOB elevated.    Transfers Overall transfer level: Needs assistance Equipment used: Rolling walker (2 wheeled) Transfers: Sit to/from Stand Sit to Stand: Min assist (Verbal cues for hand placement.)         General transfer comment: Pt reports feeling out of breath.  Ambulation/Gait Ambulation/Gait assistance: Min assist Gait Distance (Feet): 90 Feet (standing rest breaks required d/t shortness of breath) Assistive device: Rolling walker (2 wheeled) Gait Pattern/deviations: Step-through pattern Gait velocity: reduced Gait velocity  interpretation: <1.8 ft/sec, indicate of risk for recurrent falls General Gait Details: Pt requires VC to avoid obstacles in hallway. Slowed step-through gait  Stairs            Wheelchair Mobility    Modified Rankin (Stroke Patients Only)       Balance Overall balance assessment: Needs assistance Sitting-balance support: No upper extremity supported;Feet supported Sitting balance-Leahy Scale: Good     Standing balance support: Single extremity supported;No upper extremity supported Standing balance-Leahy Scale: Fair                               Pertinent Vitals/Pain Pain Assessment: 0-10 Pain Score: 5  Pain Location: R LE Pain Descriptors / Indicators: Grimacing Pain Intervention(s): Monitored during session    Home Living Family/patient expects to be discharged to:: Private residence Living Arrangements: Alone Available Help at Discharge: Family;Available PRN/intermittently Type of Home: Apartment Home Access: Elevator     Home Layout: One level Home Equipment: Wheelchair - manual;Shower seat;Other (comment) (rollator. Different reports given to OT.)      Prior Function Level of Independence: Independent with assistive device(s) (rollator)         Comments: Pt reports to being independent at home, driving short distances. Pt reports using a rollator for mobility for short distances.     Hand Dominance        Extremity/Trunk Assessment   Upper Extremity Assessment Upper Extremity Assessment: Defer to OT evaluation    Lower Extremity Assessment Lower Extremity Assessment: Overall WFL for tasks assessed    Cervical / Trunk Assessment Cervical / Trunk Assessment: Normal  Communication  Communication: No difficulties  Cognition Arousal/Alertness: Awake/alert Behavior During Therapy: WFL for tasks assessed/performed Overall Cognitive Status: Within Functional Limits for tasks assessed                                         General Comments General comments (skin integrity, edema, etc.): pt on 3L [place] upon arrival, VSS with mobility during session despite reports of SOB and increased work of breathing at times with activity. HR stable in normal sinus rhythm, rate in 70-80s    Exercises     Assessment/Plan    PT Assessment Patient needs continued PT services  PT Problem List Decreased strength;Decreased activity tolerance;Decreased balance;Decreased mobility;Decreased knowledge of use of DME;Decreased knowledge of precautions;Cardiopulmonary status limiting activity       PT Treatment Interventions DME instruction;Gait training;Stair training;Functional mobility training;Therapeutic activities;Therapeutic exercise;Balance training;Patient/family education    PT Goals (Current goals can be found in the Care Plan section)  Acute Rehab PT Goals Patient Stated Goal: to go home PT Goal Formulation: With patient Time For Goal Achievement: 07/06/20 Potential to Achieve Goals: Good    Frequency Min 3X/week   Barriers to discharge        Co-evaluation               AM-PAC PT "6 Clicks" Mobility  Outcome Measure Help needed turning from your back to your side while in a flat bed without using bedrails?: None Help needed moving from lying on your back to sitting on the side of a flat bed without using bedrails?: None Help needed moving to and from a bed to a chair (including a wheelchair)?: A Little Help needed standing up from a chair using your arms (e.g., wheelchair or bedside chair)?: A Little Help needed to walk in hospital room?: A Little Help needed climbing 3-5 steps with a railing? : A Little 6 Click Score: 20    End of Session Equipment Utilized During Treatment: Oxygen Activity Tolerance: Patient limited by fatigue Patient left: in bed;with call bell/phone within reach;with bed alarm set Nurse Communication: Mobility status PT Visit Diagnosis: Other abnormalities of gait and  mobility (R26.89)    Time: 1121-6244 PT Time Calculation (min) (ACUTE ONLY): 39 min   Charges:   PT Evaluation $PT Eval Low Complexity: 1 Low PT Treatments $Therapeutic Activity: 8-22 mins        Zenaida Niece, PT, DPT Acute Rehabilitation Pager: [phone]   Zenaida Niece 06/22/2020, 12:54 PM

## 2020-06-23 ENCOUNTER — Other Ambulatory Visit (HOSPITAL_COMMUNITY): Payer: Self-pay

## 2020-06-23 ENCOUNTER — Inpatient Hospital Stay (HOSPITAL_COMMUNITY): Payer: Medicare HMO

## 2020-06-23 DIAGNOSIS — M545 Low back pain, unspecified: Secondary | ICD-10-CM | POA: Diagnosis not present

## 2020-06-23 DIAGNOSIS — I4819 Other persistent atrial fibrillation: Secondary | ICD-10-CM | POA: Diagnosis not present

## 2020-06-23 LAB — BASIC METABOLIC PANEL
Anion gap: 4 — ABNORMAL LOW (ref 5–15)
BUN: 23 mg/dL (ref 8–23)
CO2: 30 mmol/L (ref 22–32)
Calcium: 9.7 mg/dL (ref 8.9–10.3)
Chloride: 97 mmol/L — ABNORMAL LOW (ref 98–111)
Creatinine, Ser: 0.89 mg/dL (ref 0.44–1.00)
GFR, Estimated: >60 mL/min (ref >60)
Glucose, Bld: 90 mg/dL (ref 70–99)
Potassium: 4.3 mmol/L (ref 3.5–5.1)
Sodium: 131 mmol/L — ABNORMAL LOW (ref 135–145)

## 2020-06-23 LAB — MAGNESIUM: Magnesium: 1.9 mg/dL (ref 1.7–2.4)

## 2020-06-23 MED ORDER — MAGNESIUM SULFATE IN D5W 1-5 GM/100ML-% IV SOLN
1.0000 g | Freq: Once | INTRAVENOUS | Status: AC
  Administered 2020-06-23: 1 g via INTRAVENOUS
  Filled 2020-06-23: qty 100

## 2020-06-23 MED ORDER — DOFETILIDE 125 MCG PO CAPS
125.0000 ug | ORAL_CAPSULE | Freq: Two times a day (BID) | ORAL | 0 refills | Status: DC
  Filled 2020-06-23: qty 60, 30d supply, fill #0

## 2020-06-23 MED ORDER — DOFETILIDE 125 MCG PO CAPS: 125.0000 ug | ORAL_CAPSULE | Freq: Two times a day (BID) | ORAL | 5 refills | Status: DC

## 2020-06-23 MED ORDER — MAGNESIUM OXIDE 250 MG PO TABS: 250.0000 mg | ORAL_TABLET | Freq: Every day | ORAL | 5 refills | Status: DC

## 2020-06-23 NOTE — Progress Notes (Signed)
Occupational Therapy Treatment Patient Details Name: Amanda Palmer MRN: [id number] DOB: 07/30/1933 Today's Date: 06/23/2020    History of present illness 87 y.o. female presents to [facility] on 06/20/2020 for dofetilide admission for persistent Afib. PMH includes TN, HLD, COPD.   OT comments  Pt progressing well with functional mobility approaching her baseline prior to admission. This session was focused on education of energy conservation techniques for once she returned home. Pt's family was in the room, both asking appropriate questions about different AE that can be used to assist with ADL's. Pt is overall at supervision level. Acute OT will continue to follow for safety with functional mobility.    Follow Up Recommendations  Home health OT;Supervision - Intermittent    Equipment Recommendations  None recommended by OT    Recommendations for Other Services      Precautions / Restrictions Precautions Precautions: Fall Precaution Comments: Monitor oxygen levels Restrictions Weight Bearing Restrictions: No       Mobility Bed Mobility Overal bed mobility: Modified Independent             General bed mobility comments: Pt was sitting in recliner upon entry.    Transfers Overall transfer level: Needs assistance Equipment used: Rolling walker (2 wheeled) Transfers: Sit to/from Stand Sit to Stand: Supervision         General transfer comment: verbal cues for use of brakes with 4 wheeled walker, close supervision with use of RW    Balance Overall balance assessment: Modified Independent Sitting-balance support: No upper extremity supported;Feet supported Sitting balance-Leahy Scale: Good     Standing balance support: Bilateral upper extremity supported;During functional activity;No upper extremity supported Standing balance-Leahy Scale: Fair Standing balance comment: Pt maintains static standing with bilater support on RW and when  completing dynamic standing at the sink, pt can complete with no UE support.                           ADL either performed or assessed with clinical judgement   ADL Overall ADL's : At baseline Eating/Feeding: Independent;Sitting Eating/Feeding Details (indicate cue type and reason): Pt able to feed herself sitting in recliner. Grooming: Wash/dry hands;Supervision/safety;Standing Grooming Details (indicate cue type and reason): Pt able to complete grooming standing at the sink.                             Functional mobility during ADLs: Supervision/safety;Rolling walker General ADL Comments: Pt able to complete functional mobility w/ supervision, using a RW     Vision       Perception     Praxis      Cognition Arousal/Alertness: Awake/alert Behavior During Therapy: WFL for tasks assessed/performed Overall Cognitive Status: Within Functional Limits for tasks assessed Area of Impairment: Memory                     Memory: Decreased short-term memory         General Comments: pt with significant memory impairments, difficulty recalling use of brakes with walker and forgetting portions of conversation had just minutes earlier during session        Exercises     Shoulder Instructions       General Comments VSS on 3L    Pertinent Vitals/ Pain       Pain Assessment: No/denies pain  Home Living  Prior Functioning/Environment              Frequency  Min 2X/week        Progress Toward Goals  OT Goals(current goals can now be found in the care plan section)  Progress towards OT goals: Progressing toward goals  Acute Rehab OT Goals Patient Stated Goal: to go home OT Goal Formulation: With patient Time For Goal Achievement: 07/05/20 Potential to Achieve Goals: Good ADL Goals Pt Will Perform Lower Body Bathing: with modified independence;sitting/lateral leans;sit  to/from stand Pt Will Perform Lower Body Dressing: with modified independence;sit to/from stand;sitting/lateral leans Pt Will Transfer to Toilet: with modified independence;ambulating;regular height toilet Pt Will Perform Toileting - Clothing Manipulation and hygiene: with modified independence;sitting/lateral leans;sit to/from stand Additional ADL Goal #1: Pt will identify 3 energy conservation techniques.  Plan Discharge plan remains appropriate;Frequency remains appropriate    Co-evaluation                 AM-PAC OT "6 Clicks" Daily Activity     Outcome Measure   Help from another person eating meals?: None Help from another person taking care of personal grooming?: A Little Help from another person toileting, which includes using toliet, bedpan, or urinal?: A Little Help from another person bathing (including washing, rinsing, drying)?: A Little Help from another person to put on and taking off regular upper body clothing?: A Little Help from another person to put on and taking off regular lower body clothing?: A Little 6 Click Score: 19    End of Session Equipment Utilized During Treatment: Rolling walker  OT Visit Diagnosis: Unsteadiness on feet (R26.81);Other abnormalities of gait and mobility (R26.89);Muscle weakness (generalized) (M62.81)   Activity Tolerance Patient tolerated treatment well   Patient Left in chair;with call bell/phone within reach;with family/visitor present;with nursing/sitter in room   Nurse Communication Mobility status        Time: 8938-1017 OT Time Calculation (min): 11 min  Charges: OT General Charges $OT Visit: 1 Visit OT Treatments $Self Care/Home Management : 8-22 mins  Jayin Derousse H., OTR/L Acute Rehabilitation  Raegan Winders Elane Cathlyn Tersigni 06/23/2020, 2:15 PM

## 2020-06-23 NOTE — Discharge Summary (Addendum)
ELECTROPHYSIOLOGY PROCEDURE DISCHARGE SUMMARY    Patient ID: Amanda Palmer,  MRN: [id number], DOB/AGE: Feb 20, 1934 87 y.o.  Admit date: 06/20/2020 Discharge date: 06/23/2020  Primary Care Physician: Celene Squibb, MD  Primary Cardiologist: Dr. Patria Mane, NP Electrophysiologist: Dr. Rayann Heman  Primary Discharge Diagnosis:  1.  persistent atrial fibrillation status post Tikosyn loading this admission      CHA2DS2Vasc is 7, on Eliquis, appropriately dosed  Secondary Discharge Diagnosis:  1. NICM     Currently compensated 2. HTN 3. COPD     Chronically on home O2  Allergies  Allergen Reactions  . Morphine And Related Nausea And Vomiting  . Penicillins Rash    Reaction: unknown  . Sulfa Antibiotics Nausea And Vomiting     Procedures This Admission:  1.  Tikosyn loading   Brief HPI: DEZIAH RENWICK is a 87 y.o. female with a past medical history as noted above.  They were referred to EP in the outpatient setting for treatment options of atrial fibrillation.  Risks, benefits, and alternatives to Tikosyn were reviewed with the patient who wished to proceed.    Hospital Course:  The patient was admitted and Tikosyn was initiated.  Renal function and electrolytes were followed during the hospitalization.  The patient's QTc remained stable.  She converted with drug and did not required DCCV.  She was monitored until discharge on telemetry which demonstrated SR She had a mechanical fall least evening, was holding the commode handle to balance she she stood and seems the commode slipped and she lost her balance, she denies syncope. She denies any ongoing back pain though slightly tender to touch only.  No LE weakness, numbness. H/H not far from her baseline and not felt to be actively bleeding Lumbar xray with no fractures or acute changes On the day of discharge, she  feels well,  She was evaluated by PT and able to ambulate to her needs in her apartment, recommended home  PT, case maneger recommends aid as well, The patient was agreeable to home visits She was examined by Dr Rayann Heman who considered the patient stable for discharge to home.  Follow-up has been arranged with the AFib clinic in 1 week and with Dr Rayann Heman in 4 weeks.   Tikosyn teaching was completed with the patient and her niece at bedside who lives with the patient. electrolyte replacement for home will be the addition of low dose magnesium supplementation  Initial dofetilide was sent to [facility], refills and the Magnesium to her [facility] in [place]  Physical Exam: Vitals:   06/23/20 0017 06/23/20 0259 06/23/20 0753 06/23/20 0851  BP: (!) 125/57 (!) 115/52  (!) 122/55  Pulse: 70 73  78  Resp: 19 19    Temp: 99.2 F (37.3 C) 97.7 F (36.5 C)    TempSrc: Oral Oral    SpO2: 97% 100% 99%   Weight:  65 kg    Height:         GEN- The patient is well appearing, alert and oriented x 3 today.   HEENT: normocephalic, atraumatic; sclera clear, conjunctiva pink; hearing intact; oropharynx clear; neck supple, no JVP Lymph- no cervical lymphadenopathy Lungs- CTA b/l, normal work of breathing.  No wheezes, rales, rhonchi Heart- RRR, no murmurs, rubs or gallops, PMI not laterally displaced GI- soft, non-tender, non-distended Extremities- no clubbing, cyanosis, or edema MS- no significant deformity or atrophy, she has a very small area of ecchymosis to her lumbar back, only very slightly  tender to touch, no hematoma, no bleeding, no limitations in ROM from her baseline Skin- warm and dry, no rash or lesion Psych- euthymic mood, full affect Neuro- strength and sensation are intact   Labs:   Lab Results  Component Value Date   WBC 5.2 06/22/2020   HGB 8.8 (L) 06/22/2020   HCT 29.4 (L) 06/22/2020   MCV 93.0 06/22/2020   PLT 202 06/22/2020    Recent Labs  Lab 06/23/20 0335  NA 131*  K 4.3  CL 97*  CO2 30  BUN 23  CREATININE 0.89  CALCIUM 9.7  GLUCOSE 90     Discharge Medications:  Allergies  as of 06/23/2020      Reactions   Morphine And Related Nausea And Vomiting   Penicillins Rash   Reaction: unknown   Sulfa Antibiotics Nausea And Vomiting      Medication List    TAKE these medications   albuterol 108 (90 Base) MCG/ACT inhaler Commonly known as: VENTOLIN HFA Inhale 2 puffs into the lungs every 6 (six) hours as needed for wheezing or shortness of breath.   albuterol (2.5 MG/3ML) 0.083% nebulizer solution Commonly known as: PROVENTIL Take 3 mLs (2.5 mg total) by nebulization every 6 (six) hours as needed for wheezing or shortness of breath.   atorvastatin 20 MG tablet Commonly known as: LIPITOR Take 20 mg by mouth at bedtime.   clonazePAM 1 MG tablet Commonly known as: KLONOPIN Take 1 tablet (1 mg total) by mouth at bedtime.   diltiazem 120 MG 24 hr capsule Commonly known as: CARDIZEM CD Take 1 capsule (120 mg total) by mouth daily. (long acting)   diltiazem 30 MG tablet Commonly known as: CARDIZEM Take 1 tablet (30 mg total) by mouth every 8 (eight) hours as needed (for heartrate greater than 100).   dofetilide 125 MCG capsule Commonly known as: TIKOSYN Take 1 capsule (125 mcg total) by mouth 2 (two) times daily.   Eliquis 5 MG Tabs tablet Generic drug: apixaban Take 1 tablet by mouth twice daily What changed:   how much to take  when to take this   esomeprazole 20 MG capsule Commonly known as: NEXIUM Take 20 mg by mouth daily.   famotidine 20 MG tablet Commonly known as: PEPCID Take 20 mg by mouth at bedtime.   Flutter Devi 1 Device by Does not apply route in the morning, at noon, in the evening, and at bedtime.   furosemide 20 MG tablet Commonly known as: LASIX Take 20 mg by mouth daily.   ibandronate 150 MG tablet Commonly known as: BONIVA Take 150 mg by mouth every 30 (thirty) days.   losartan 50 MG tablet Commonly known as: COZAAR Take 50 mg by mouth daily.   Magnesium Oxide 250 MG Tabs Take 1 tablet (250 mg total) by mouth  daily.   metoprolol succinate 50 MG 24 hr tablet Commonly known as: Toprol XL Take 1 tablet (50 mg total) by mouth daily. Take with or immediately following a meal.   multivitamin with minerals Tabs tablet Take 1 tablet by mouth daily.   OXYGEN Inhale 3 L into the lungs continuous.   potassium chloride 10 MEQ tablet Commonly known as: KLOR-CON Take 2 tablets (20 mEq total) by mouth daily. What changed: when to take this   Probiotic Daily Caps Take 1 capsule by mouth daily.   Siltussin SA 100 MG/5ML syrup Generic drug: guaifenesin Take 5 mLs by mouth every 4 (four) hours as needed.   Trelegy  Ellipta 100-62.5-25 MCG/INH Aepb Generic drug: Fluticasone-Umeclidin-Vilant INHALE ONE PUFF into THE lungs DAILY What changed: See the new instructions.   triamcinolone cream 0.1 % Commonly known as: KENALOG Apply 1 application topically daily as needed (hemorrhoids).   venlafaxine 75 MG tablet Commonly known as: EFFEXOR Take 75-150 mg by mouth See admin instructions. Take 150 mg in the morning and 75 mg in the evening       Disposition: Home   Follow-up [place] Follow up.   Specialty: Cardiology Why: 06/30/20 @ 11:30 with [facility], [place] Contact information: [address] [id number] mc [place] [phone]       Shirley Friar, PA-C Follow up.   Specialty: Physician Assistant Why: 07/21/20 @ 11:20AM for Dr. Amada [place] information: [place] [id number] [phone]        Care, [facility] Follow up.   Specialty: Home Health Services Why: Physical Therapy and home health aide-office to call the niece with visit times.  Contact information: [place] [address] [phone]               Duration of Discharge Encounter: Greater than 30 minutes including physician time.  Signed, Tommye Standard,  PA-C 06/23/2020 12:49 PM    I have seen, examined the patient, and reviewed the above assessment and plan.  Changes to above are made where necessary.  On exam, RRR.  Clinically stable for discharge.  DC to home with close outpatient follow-up. Follow-up with PCP regarding falls.  Co Sign: Thompson Grayer, MD

## 2020-06-23 NOTE — Progress Notes (Signed)
Physical Therapy Treatment Patient Details Name: Amanda Palmer MRN: [id number] DOB: Dec 22, 1933 Today's Date: 06/23/2020    History of Present Illness 87 y.o. female presents to [facility] on 06/20/2020 for dofetilide admission for persistent Afib. PMH includes TN, HLD, COPD.    PT Comments    Pt tolerates mobility well but demonstrates deficits in memory and device management on this date. Pt with poor use of brakes on Rollator and is unable to demonstrate retention of PT cues during this session. Pt's visitor reports she often forgets to utilize brakes at home. Pt ambulates well with use of RW at this time, and PT encourages use of this device in the home. PT recommends use of RW for all household mobility, and use of Rollator for community mobility with family supervision. Pt expresses concerns about discomfort in her current hospital bed and PT provides suggestion of mattress topper. PT also recommends increased supervision from family upon initial return home until HHPT can provide services.   Follow Up Recommendations  Home health PT     Equipment Recommendations   (no needs, pt reports owning a hospital bed and a RW)    Recommendations for Other Services       Precautions / Restrictions Precautions Precautions: Fall Precaution Comments: Monitor oxygen levels Restrictions Weight Bearing Restrictions: No    Mobility  Bed Mobility Overal bed mobility: Modified Independent             General bed mobility comments: increased time, HOB elevated    Transfers Overall transfer level: Needs assistance Equipment used: Rolling walker (2 wheeled);4-wheeled walker Transfers: Sit to/from Stand Sit to Stand: Min guard         General transfer comment: verbal cues for use of brakes with 4 wheeled walker, close supervision with use of RW  Ambulation/Gait Ambulation/Gait assistance: Min guard Gait Distance (Feet): 120 Feet (additional  90') Assistive device: Rolling walker (2 wheeled);4-wheeled walker Gait Pattern/deviations: Step-through pattern Gait velocity: reduced Gait velocity interpretation: <1.8 ft/sec, indicate of risk for recurrent falls General Gait Details: pt with slowed step-through gait, no LOB noted. Pt requires verbal cues for use of brakes when attempting to sit or stand from Rollator, limited retention of information noted   Stairs             Wheelchair Mobility    Modified Rankin (Stroke Patients Only)       Balance Overall balance assessment: Needs assistance Sitting-balance support: No upper extremity supported;Feet supported Sitting balance-Leahy Scale: Good     Standing balance support: Single extremity supported;No upper extremity supported Standing balance-Leahy Scale: Fair                              Cognition Arousal/Alertness: Awake/alert Behavior During Therapy: WFL for tasks assessed/performed Overall Cognitive Status: Impaired/Different from baseline Area of Impairment: Memory                     Memory: Decreased short-term memory         General Comments: pt with significant memory impairments, difficulty recalling use of brakes with walker and forgetting portions of conversation had just minutes earlier during session      Exercises      General Comments General comments (skin integrity, edema, etc.): pt on 3L [place], reports increased work of breathing however sats stable      Pertinent Vitals/Pain Pain Assessment: No/denies pain  Home Living                      Prior Function            PT Goals (current goals can now be found in the care plan section) Acute Rehab PT Goals Patient Stated Goal: to go home Progress towards PT goals: Progressing toward goals    Frequency    Min 3X/week      PT Plan Current plan remains appropriate    Co-evaluation              AM-PAC PT "6 Clicks" Mobility    Outcome Measure  Help needed turning from your back to your side while in a flat bed without using bedrails?: None Help needed moving from lying on your back to sitting on the side of a flat bed without using bedrails?: None Help needed moving to and from a bed to a chair (including a wheelchair)?: A Little Help needed standing up from a chair using your arms (e.g., wheelchair or bedside chair)?: A Little Help needed to walk in hospital room?: A Little Help needed climbing 3-5 steps with a railing? : A Little 6 Click Score: 20    End of Session Equipment Utilized During Treatment: Gait belt;Oxygen Activity Tolerance: Patient tolerated treatment well Patient left: in bed;with call bell/phone within reach;with bed alarm set;with family/visitor present Nurse Communication: Mobility status PT Visit Diagnosis: Other abnormalities of gait and mobility (R26.89)     Time: 2263-3354 PT Time Calculation (min) (ACUTE ONLY): 54 min  Charges:  $Gait Training: 23-37 mins $Therapeutic Activity: 8-22 mins $Self Care/Home Management: [place], PT, DPT Acute Rehabilitation Pager: [place] 06/23/2020, 1:28 PM

## 2020-06-23 NOTE — Progress Notes (Signed)
Patient with fall on day shift. Nursing noticed worsening pain with ambulation. Ordered XR L spine to ensure no fx.

## 2020-06-23 NOTE — Care Management Important Message (Signed)
Important Message  Patient Details  Name: Amanda Palmer MRN: [id number] Date of Birth: April 08, 1933   Medicare Important Message Given:  Yes - Important Message mailed due to current National Emergency   Verbal consent obtained due to current National Emergency  Relationship to patient: Self Contact Name: Joaquim Lai Call Date: 06/23/20  Time: 1406 Phone: [phone] Outcome: No Answer/Busy Important Message mailed to: Patient address on file    Delorse Lek 06/23/2020, 2:06 PM

## 2020-06-23 NOTE — TOC Initial Note (Addendum)
Transition of Care [facility]) - Initial/Assessment Note    Patient Details  Name: Amanda Palmer MRN: [id number] Date of Birth: [birth year]  Transition of Care [facility]) CM/SW Contact:    Bethena Roys, RN Phone Number: 06/23/2020, 12:16 PM  Clinical Narrative:  Patient presented for T[place]osyn load. Plan will be to transition home with home health services. Case Manager spoke with the patient and she wanted someone to assist with light house cleaning and preparing meals. Patient lives alone and has some support from her niece and other family members. Case Manager discussed that the patient would benefit from personal care services; however, the family would be responsible for the cost of services. Niece is at the bedside and is aware of the information. Niece stated that the patient is working on getting Medicaid- awaiting to hear back from [place]. Patient was agreeable to home health physical therapy and an aide for a bath. The patient did not have a preference for home health services- Case Manager called [facility] and the agency has a delay until next Wednesday- family and the physician is aware. PT to let Case Manager know if she will need a hospital bed at home. Patient has an older hospital bed in the home already.         1242 06-23-20 Per PT patient will not need a hospital bed. No further needs from Case Manager at this time.         Expected Discharge Plan: [place] Barriers to Discharge: No Barriers Identified   Patient Goals and CMS Choice Patient states their goals for this hospitalization and ongoing recovery are:: to return home   Choice offered to / list presented to : NA (Patient did not have a preference for home health services.)  Expected Discharge Plan and Services Expected Discharge Plan: [place] In-house Referral: NA Discharge Planning Services: CM Consult Post Acute Care Choice: [place] arrangements for the past 2  months: Apartment                  HH Arranged: PT,Nurse's Aide [place]: [place] Date [place]: 06/23/20 Time HH Agency Contacted: 1200 Representative spoke with at [place]: [place] Arrangements/Services Living arrangements for the past 2 months: Apartment Lives with:: Self (has family support.) Patient language and need for interpreter reviewed:: Yes Do you feel safe going back to the place where you live?: Yes      Need for Family Participation in Patient Care: Yes (Comment) Care giver support system in place?: Yes (comment)   Criminal Activity/Legal Involvement Pertinent to Current Situation/Hospitalization: No - Comment as needed  Activities of Daily Living Home Assistive Devices/Equipment: Walker (specify type),Wheelchair (when needed) ADL Screening (condition at time of admission) Patient's cognitive ability adequate to safely complete daily activities?: Yes Is the patient deaf or have difficulty hearing?: Yes (wears hearing aids) Does the patient have difficulty seeing, even when wearing glasses/contacts?: No Does the patient have difficulty concentrating, remembering, or making decisions?: Yes Patient able to express need for assistance with ADLs?: Yes Does the patient have difficulty dressing or bathing?: No Independently performs ADLs?: Yes (appropriate for developmental age) Does the patient have difficulty walking or climbing stairs?: Yes Weakness of Legs: Both Weakness of Arms/Hands: Both  Permission Sought/Granted Permission sought to share information with : Family Estate agent Permission granted to share information with : Yes, Verbal Permission Granted  Permission granted to share info w AGENCY: [facility]        Emotional Assessment Appearance:: Appears stated age Attitude/Demeanor/Rapport: Engaged Affect (typically observed): Appropriate Orientation: : Oriented to  Place,Oriented to Self Alcohol / Substance Use: Not Applicable Psych Involvement: No (comment)  Admission diagnosis:  Persistent atrial fibrillation ([place]) [I48.19] Patient Active Problem List   Diagnosis Date Noted  . Persistent atrial fibrillation ([place]) 05/09/2020  . Secondary hypercoagulable state ([place]) 05/09/2020  . Abnormal CT of the chest 07/06/2019  . GERD (gastroesophageal reflux disease) 10/22/2016  . Hiatal hernia 10/22/2016  . H/O adenomatous polyp of colon 10/22/2016  . Productive cough 03/29/2015  . Dyspnea 02/23/2015  . Trimalleolar fracture of ankle, closed 05/31/2014  . Syncope and collapse 05/31/2014  . Scalp laceration 05/31/2014  . ARF (acute renal failure) ([place]) 05/31/2014  . Syncope 05/31/2014  . COPD (chronic obstructive pulmonary disease) ([place]) 05/22/2014  . COPD exacerbation ([place]) 05/22/2014  . HTN (hypertension) 05/22/2014  . Arrhythmia 05/17/2014   PCP:  Celene Squibb, MD Pharmacy:   [place], [place] - [address] [place] [address] Phone: [phone] Fax: [phone]  [place], [place] [address] Phone: [phone] Fax: [place] [address] [place] [address] Phone: [phone] Fax: [phone]   Readmission Risk Interventions No flowsheet data found.

## 2020-06-26 ENCOUNTER — Telehealth: Payer: Self-pay | Admitting: Cardiology

## 2020-06-26 NOTE — Telephone Encounter (Signed)
Patient called wanting to know if she still needs to see Dr. Harl Bowie and Heart failure clinic.

## 2020-06-26 NOTE — Telephone Encounter (Signed)
Explained to patient that you typically will continue to see both - Dr. Harl Bowie for primary cardiology care & Dr. Rayann Heman, AFib clinic for her abnormal rhythm (AFib).  Will send to provider for clarification.

## 2020-06-30 ENCOUNTER — Ambulatory Visit (HOSPITAL_COMMUNITY)
Admit: 2020-06-30 | Discharge: 2020-06-30 | Disposition: A | Discharge status: Home / Self Care | Payer: Medicare HMO | Source: Ambulatory Visit | Attending: Physician Assistant | Admitting: Physician Assistant

## 2020-06-30 ENCOUNTER — Other Ambulatory Visit: Payer: Self-pay

## 2020-06-30 ENCOUNTER — Encounter (HOSPITAL_COMMUNITY): Payer: Self-pay | Admitting: Physician Assistant

## 2020-06-30 VITALS — BP 146/72 | HR 80 | Ht 64.0 in | Wt 140.4 lb

## 2020-06-30 DIAGNOSIS — D6869 Other thrombophilia: Secondary | ICD-10-CM | POA: Diagnosis not present

## 2020-06-30 DIAGNOSIS — I4819 Other persistent atrial fibrillation: Secondary | ICD-10-CM | POA: Diagnosis not present

## 2020-06-30 DIAGNOSIS — Z79899 Other long term (current) drug therapy: Secondary | ICD-10-CM | POA: Insufficient documentation

## 2020-06-30 DIAGNOSIS — Z8249 Family history of ischemic heart disease and other diseases of the circulatory system: Secondary | ICD-10-CM | POA: Insufficient documentation

## 2020-06-30 DIAGNOSIS — J449 Chronic obstructive pulmonary disease, unspecified: Secondary | ICD-10-CM | POA: Insufficient documentation

## 2020-06-30 DIAGNOSIS — Z9981 Dependence on supplemental oxygen: Secondary | ICD-10-CM | POA: Diagnosis not present

## 2020-06-30 DIAGNOSIS — I5020 Unspecified systolic (congestive) heart failure: Secondary | ICD-10-CM | POA: Insufficient documentation

## 2020-06-30 DIAGNOSIS — Z7901 Long term (current) use of anticoagulants: Secondary | ICD-10-CM | POA: Diagnosis not present

## 2020-06-30 DIAGNOSIS — I2510 Atherosclerotic heart disease of native coronary artery without angina pectoris: Secondary | ICD-10-CM | POA: Diagnosis not present

## 2020-06-30 DIAGNOSIS — I110 Hypertensive heart disease with heart failure: Secondary | ICD-10-CM | POA: Insufficient documentation

## 2020-06-30 DIAGNOSIS — Z8616 Personal history of COVID-19: Secondary | ICD-10-CM | POA: Insufficient documentation

## 2020-06-30 DIAGNOSIS — E785 Hyperlipidemia, unspecified: Secondary | ICD-10-CM | POA: Diagnosis not present

## 2020-06-30 LAB — BASIC METABOLIC PANEL
Anion gap: 6 (ref 5–15)
BUN: 14 mg/dL (ref 8–23)
CO2: 29 mmol/L (ref 22–32)
Calcium: 9.5 mg/dL (ref 8.9–10.3)
Chloride: 100 mmol/L (ref 98–111)
Creatinine, Ser: 0.94 mg/dL (ref 0.44–1.00)
GFR, Estimated: 60 mL/min — ABNORMAL LOW (ref >60)
Glucose, Bld: 97 mg/dL (ref 70–99)
Potassium: 3.9 mmol/L (ref 3.5–5.1)
Sodium: 135 mmol/L (ref 135–145)

## 2020-06-30 LAB — MAGNESIUM: Magnesium: 2 mg/dL (ref 1.7–2.4)

## 2020-06-30 MED ORDER — POTASSIUM CHLORIDE ER 10 MEQ PO TBCR: 20.0000 meq | EXTENDED_RELEASE_TABLET | Freq: Every day | ORAL | 3 refills | Status: DC

## 2020-06-30 MED ORDER — DILTIAZEM HCL 30 MG PO TABS: 30.0000 mg | ORAL_TABLET | Freq: Three times a day (TID) | ORAL | 2 refills | Status: DC | PRN

## 2020-06-30 NOTE — Progress Notes (Signed)
Primary Care Physician: Benita Stabile, MD Primary Cardiologist: Dr Wyline Mood Primary Electrophysiologist: Dr Johney Frame Referring Physician: Rennis Harding   Amanda Palmer is a 87 y.o. female with a history of HTN, HLD, COPD, atrial fibrillation who presents for follow up in the [facility]. The patient was initially diagnosed with atrial fibrillation remotely. Patient is on Eliquis for a CHADS2VASC score of 8. She was seen at [facility] ED 04/10/20 with COVID pneumonia. She was also in rapid afib at that time. She was seen by Rennis Harding on 05/05/20 and remained in rapid atrial fibrillation. Diltiazem was started for rate control. She has symptoms of palpitations and heart racing but overall her SOB has improved with resolution of PNA and diuretics. She did miss a dose of Eliquis 05/08/20. Patient is s/p TEE guided DCCV on 05/17/20. TEE showed a reduced EF 35-40%.   On follow up today, patient is s/p dofetilide admission 4/12-4/15/22. She converted with the medication and did not require DCCV. She did have have a mechanical fall while at the hospital. No fracture on imaging and H/H remained stable. She is in SR today.   Today, she denies symptoms of palpitations, chest pain, orthopnea, PND, dizziness, presyncope, syncope, bleeding, or neurologic sequela. The patient is tolerating medications without difficulties and is otherwise without complaint today.    Atrial Fibrillation Risk Factors:  she does not have symptoms or diagnosis of sleep apnea. she does not have a history of rheumatic fever.   she has a BMI of Body mass index is 24.1 kg/m.Marland Kitchen Filed Weights   06/30/20 1139  Weight: 63.7 kg    Family History  Problem Relation Age of Onset  . Heart disease Brother   . Heart disease Sister   . Breast cancer Sister   . Leukemia Brother   . Breast cancer Sister   . Breast cancer Other        one deceased, one living  . Colon cancer Neg Hx      Atrial Fibrillation  Management history:  Previous antiarrhythmic drugs: dofetilide  Previous cardioversions: 05/17/20 Previous ablations: none CHADS2VASC score: 8 Anticoagulation history: Eliquis   Past Medical History:  Diagnosis Date  . A-fib (HCC)   . Anxiety   . Cancer (HCC)    breast  . COPD (chronic obstructive pulmonary disease) (HCC)   . Depression   . Hypertension   . TIA (transient ischemic attack)    remote   Past Surgical History:  Procedure Laterality Date  . ABDOMINAL HYSTERECTOMY    . APPENDECTOMY    . BACK SURGERY     total of five surgeries  . BREAST BIOPSY Left    benign  . BREAST CAPSULECTOMY WITH IMPLANT EXCHANGE Right 05/27/2016   Procedure: REMOVAL AND REPLACEMENT OF RIGHT BREAST IMPLANT AND BREAST CAPSULECTOMY;  Surgeon: Louisa Second, MD;  Location: [facility];  Service: Plastics;  Laterality: Right;  . CARDIAC CATHETERIZATION    . CARDIOVERSION N/A 05/17/2020   Procedure: CARDIOVERSION;  Surgeon: Elease Hashimoto Deloris Ping, MD;  Location: [facility] ENDOSCOPY;  Service: Cardiovascular;  Laterality: N/A;  . CHOLECYSTECTOMY    . COLONOSCOPY  08/2014   Dr. Teena Dunk: Small sessile polyp at the cecum, removed, tubular adenoma  . ESOPHAGOGASTRODUODENOSCOPY  06/2013   Dr. Teena Dunk: Hiatal hernia, Schatzki ring, nonobstructive.  Marland Kitchen MASTECTOMY     right   . ORIF ANKLE FRACTURE Left 06/01/2014   Procedure: OPEN REDUCTION INTERNAL FIXATION (ORIF) LEFT ANKLE FRACTURE;  Surgeon: Loraine Leriche  Jule Economy, MD;  Location: [facility];  Service: Orthopedics;  Laterality: Left;  . ROTATOR CUFF REPAIR     left  . TEE WITHOUT CARDIOVERSION N/A 05/17/2020   Procedure: TRANSESOPHAGEAL ECHOCARDIOGRAM (TEE);  Surgeon: Acie Fredrickson Wonda Cheng, MD;  Location: [facility];  Service: Cardiovascular;  Laterality: N/A;  . TOTAL HIP ARTHROPLASTY     right  . TOTAL KNEE ARTHROPLASTY     bilateral    Current Outpatient Medications  Medication Sig Dispense Refill  . albuterol (PROVENTIL HFA;VENTOLIN HFA) 108 (90 Base) MCG/ACT  inhaler Inhale 2 puffs into the lungs every 6 (six) hours as needed for wheezing or shortness of breath.    Marland Kitchen albuterol (PROVENTIL) (2.5 MG/3ML) 0.083% nebulizer solution Take 3 mLs (2.5 mg total) by nebulization every 6 (six) hours as needed for wheezing or shortness of breath. 360 mL 5  . atorvastatin (LIPITOR) 20 MG tablet Take 20 mg by mouth at bedtime.    . clonazePAM (KLONOPIN) 1 MG tablet Take 1 tablet (1 mg total) by mouth at bedtime. 30 tablet 0  . diltiazem (CARDIZEM CD) 120 MG 24 hr capsule Take 1 capsule (120 mg total) by mouth daily. (long acting) 30 capsule 6  . dofetilide (TIKOSYN) 125 MCG capsule Take 1 capsule (125 mcg total) by mouth 2 (two) times daily. 60 capsule 0  . ELIQUIS 5 MG TABS tablet Take 1 tablet by mouth twice daily 60 tablet 6  . esomeprazole (NEXIUM) 20 MG capsule Take 20 mg by mouth daily.    . famotidine (PEPCID) 20 MG tablet Take 20 mg by mouth at bedtime.    . furosemide (LASIX) 20 MG tablet Take 20 mg by mouth daily.    Marland Kitchen ibandronate (BONIVA) 150 MG tablet Take 150 mg by mouth every 30 (thirty) days.    Marland Kitchen losartan (COZAAR) 50 MG tablet Take 50 mg by mouth daily.    . Magnesium Oxide 250 MG TABS Take 1 tablet (250 mg total) by mouth daily. 30 tablet 5  . metoprolol succinate (TOPROL XL) 50 MG 24 hr tablet Take 1 tablet (50 mg total) by mouth daily. Take with or immediately following a meal. 30 tablet 3  . Multiple Vitamin (MULTIVITAMIN WITH MINERALS) TABS tablet Take 1 tablet by mouth daily.    . OXYGEN Inhale 3 L into the lungs continuous.    . Probiotic Product (PROBIOTIC DAILY) CAPS Take 1 capsule by mouth daily.    Marland Kitchen Respiratory Therapy Supplies (FLUTTER) DEVI 1 Device by Does not apply route in the morning, at noon, in the evening, and at bedtime. 1 each 0  . SILTUSSIN SA 100 MG/5ML syrup Take 5 mLs by mouth every 4 (four) hours as needed.    . TRELEGY ELLIPTA 100-62.5-25 MCG/INH AEPB INHALE ONE PUFF into THE lungs DAILY 60 each 5  . triamcinolone cream  (KENALOG) 0.1 % Apply 1 application topically daily as needed (hemorrhoids).    . venlafaxine (EFFEXOR) 75 MG tablet Take 75-150 mg by mouth See admin instructions. Take 150 mg in the morning and 75 mg in the evening    . diltiazem (CARDIZEM) 30 MG tablet Take 1 tablet (30 mg total) by mouth every 8 (eight) hours as needed (for heartrate greater than 100). 30 tablet 2  . potassium chloride (KLOR-CON) 10 MEQ tablet Take 2 tablets (20 mEq total) by mouth daily. 90 tablet 3   No current facility-administered medications for this encounter.    Allergies  Allergen Reactions  . Morphine And Related  Nausea And Vomiting  . Penicillins Rash    Reaction: unknown  . Sulfa Antibiotics Nausea And Vomiting    Social History   Socioeconomic History  . Marital status: Widowed    Spouse name: Not on file  . Number of children: Not on file  . Years of education: Not on file  . Highest education level: Not on file  Occupational History  . Not on file  Tobacco Use  . Smoking status: Never Smoker  . Smokeless tobacco: Never Used  Vaping Use  . Vaping Use: Never used  Substance and Sexual Activity  . Alcohol use: No    Alcohol/week: 0.0 standard drinks  . Drug use: No  . Sexual activity: Not on file  Other Topics Concern  . Not on file  Social History Narrative  . Not on file   Social Determinants of Health   Financial Resource Strain: Not on file  Food Insecurity: Not on file  Transportation Needs: Not on file  Physical Activity: Not on file  Stress: Not on file  Social Connections: Not on file  Intimate Partner Violence: Not on file     ROS- All systems are reviewed and negative except as per the HPI above.  Physical Exam: Vitals:   06/30/20 1139  BP: (!) 146/72  Pulse: 80  Weight: 63.7 kg  Height: 5\' 4"  (1.626 m)    GEN- The patient is a well appearing elderly female, alert and oriented x 3 today.   HEENT-head normocephalic, atraumatic, sclera clear, conjunctiva pink,  hearing intact, trachea midline. Lungs- Clear to ausculation bilaterally, normal work of breathing, O2 nasal canula  Heart- Regular rate and rhythm, no murmurs, rubs or gallops  GI- soft, NT, ND, + BS Extremities- no clubbing, cyanosis, or edema MS- no significant deformity or atrophy Skin- no rash or lesion Psych- euthymic mood, full affect Neuro- strength and sensation are intact   Wt Readings from Last 3 Encounters:  06/30/20 63.7 kg  06/23/20 65 kg  06/20/20 64.5 kg    EKG today demonstrates  SR, LAFB Vent. rate 80 BPM PR interval 160 ms QRS duration 80 ms QT/QTcB 408/470 ms  Echo 12/30/19 demonstrated  1. Left ventricular ejection fraction, by estimation, is 60 to 65%. The  left ventricle has normal function. The left ventricle has no regional  wall motion abnormalities. Left ventricular diastolic parameters are  consistent with Grade II diastolic  dysfunction (pseudonormalization).  2. Right ventricular systolic function is normal. The right ventricular  size is normal. There is mildly to moderately elevated pulmonary artery  systolic pressure. The estimated right ventricular systolic pressure is  123456 mmHg.  3. Left atrial size was moderately dilated.  4. The mitral valve is abnormal. Moderate mitral valve regurgitation.  5. The aortic valve is tricuspid. Aortic valve regurgitation is not  visualized. Mild aortic valve sclerosis is present, with no evidence of  aortic valve stenosis.  6. The inferior vena cava is normal in size with greater than 50%  respiratory variability, suggesting right atrial pressure of 3 mmHg.   Epic records are reviewed at length today  CHA2DS2-VASc Score = 8  The patient's score is based upon: CHF History: Yes HTN History: Yes Diabetes History: No Stroke History: Yes Vascular Disease History: Yes Age Score: 2 Gender Score: 1      ASSESSMENT AND PLAN: 1. Persistent Atrial Fibrillation (ICD10:  I48.19) The patient's  CHA2DS2-VASc score is 8, indicating a 10.8% annual risk of stroke.   S/p dofetilide  loading 4/12-4/15/22 Patient appears to be maintaining SR.  Continue dofetilide 125 mcg BID Continue Eliquis 5 mg BID Check bmet/mag Continue Toprol 50 mg daily Continue diltiazem 120 mg daily  2. Secondary Hypercoagulable State (ICD10:  D68.69) The patient is at significant risk for stroke/thromboembolism based upon her CHA2DS2-VASc Score of 8.  Continue Apixaban (Eliquis).   3. HTN Stable, no changes today.  4. CAD Noted on CT No anginal symptoms.   5. Systolic dysfunction  TEE showed EF 35-40% Suspect related to #1 given her EF was normal in October. No signs or symptoms of fluid overload today. Patient may take an extra dose of Lasix for weight gain/increased edema.    Follow up with Oda Kilts and Dr Harl Bowie as scheduled.    [facility] [address] [place], [place] [address] [phone] 06/30/2020 12:06 PM

## 2020-07-03 DIAGNOSIS — K219 Gastro-esophageal reflux disease without esophagitis: Secondary | ICD-10-CM | POA: Diagnosis not present

## 2020-07-03 DIAGNOSIS — R69 Illness, unspecified: Secondary | ICD-10-CM | POA: Diagnosis not present

## 2020-07-03 DIAGNOSIS — I428 Other cardiomyopathies: Secondary | ICD-10-CM | POA: Diagnosis not present

## 2020-07-03 DIAGNOSIS — I080 Rheumatic disorders of both mitral and aortic valves: Secondary | ICD-10-CM | POA: Diagnosis not present

## 2020-07-03 DIAGNOSIS — I2510 Atherosclerotic heart disease of native coronary artery without angina pectoris: Secondary | ICD-10-CM | POA: Diagnosis not present

## 2020-07-03 DIAGNOSIS — I482 Chronic atrial fibrillation, unspecified: Secondary | ICD-10-CM | POA: Diagnosis not present

## 2020-07-03 DIAGNOSIS — E785 Hyperlipidemia, unspecified: Secondary | ICD-10-CM | POA: Diagnosis not present

## 2020-07-03 DIAGNOSIS — H9190 Unspecified hearing loss, unspecified ear: Secondary | ICD-10-CM | POA: Diagnosis not present

## 2020-07-03 DIAGNOSIS — I119 Hypertensive heart disease without heart failure: Secondary | ICD-10-CM | POA: Diagnosis not present

## 2020-07-03 DIAGNOSIS — J441 Chronic obstructive pulmonary disease with (acute) exacerbation: Secondary | ICD-10-CM | POA: Diagnosis not present

## 2020-07-05 DIAGNOSIS — J449 Chronic obstructive pulmonary disease, unspecified: Secondary | ICD-10-CM | POA: Diagnosis not present

## 2020-07-06 DIAGNOSIS — K219 Gastro-esophageal reflux disease without esophagitis: Secondary | ICD-10-CM | POA: Diagnosis not present

## 2020-07-06 DIAGNOSIS — H9190 Unspecified hearing loss, unspecified ear: Secondary | ICD-10-CM | POA: Diagnosis not present

## 2020-07-06 DIAGNOSIS — E785 Hyperlipidemia, unspecified: Secondary | ICD-10-CM | POA: Diagnosis not present

## 2020-07-06 DIAGNOSIS — I428 Other cardiomyopathies: Secondary | ICD-10-CM | POA: Diagnosis not present

## 2020-07-06 DIAGNOSIS — R69 Illness, unspecified: Secondary | ICD-10-CM | POA: Diagnosis not present

## 2020-07-06 DIAGNOSIS — I119 Hypertensive heart disease without heart failure: Secondary | ICD-10-CM | POA: Diagnosis not present

## 2020-07-06 DIAGNOSIS — J441 Chronic obstructive pulmonary disease with (acute) exacerbation: Secondary | ICD-10-CM | POA: Diagnosis not present

## 2020-07-06 DIAGNOSIS — I2510 Atherosclerotic heart disease of native coronary artery without angina pectoris: Secondary | ICD-10-CM | POA: Diagnosis not present

## 2020-07-06 DIAGNOSIS — I482 Chronic atrial fibrillation, unspecified: Secondary | ICD-10-CM | POA: Diagnosis not present

## 2020-07-07 DIAGNOSIS — D509 Iron deficiency anemia, unspecified: Secondary | ICD-10-CM | POA: Diagnosis not present

## 2020-07-07 DIAGNOSIS — J441 Chronic obstructive pulmonary disease with (acute) exacerbation: Secondary | ICD-10-CM | POA: Diagnosis not present

## 2020-07-07 DIAGNOSIS — E785 Hyperlipidemia, unspecified: Secondary | ICD-10-CM | POA: Diagnosis not present

## 2020-07-07 DIAGNOSIS — D649 Anemia, unspecified: Secondary | ICD-10-CM | POA: Diagnosis not present

## 2020-07-07 DIAGNOSIS — I428 Other cardiomyopathies: Secondary | ICD-10-CM | POA: Diagnosis not present

## 2020-07-07 DIAGNOSIS — E1165 Type 2 diabetes mellitus with hyperglycemia: Secondary | ICD-10-CM | POA: Diagnosis not present

## 2020-07-07 DIAGNOSIS — E782 Mixed hyperlipidemia: Secondary | ICD-10-CM | POA: Diagnosis not present

## 2020-07-07 DIAGNOSIS — M818 Other osteoporosis without current pathological fracture: Secondary | ICD-10-CM | POA: Diagnosis not present

## 2020-07-07 DIAGNOSIS — E7849 Other hyperlipidemia: Secondary | ICD-10-CM | POA: Diagnosis not present

## 2020-07-07 DIAGNOSIS — I482 Chronic atrial fibrillation, unspecified: Secondary | ICD-10-CM | POA: Diagnosis not present

## 2020-07-07 DIAGNOSIS — K219 Gastro-esophageal reflux disease without esophagitis: Secondary | ICD-10-CM | POA: Diagnosis not present

## 2020-07-07 DIAGNOSIS — I10 Essential (primary) hypertension: Secondary | ICD-10-CM | POA: Diagnosis not present

## 2020-07-07 DIAGNOSIS — I2510 Atherosclerotic heart disease of native coronary artery without angina pectoris: Secondary | ICD-10-CM | POA: Diagnosis not present

## 2020-07-07 DIAGNOSIS — I119 Hypertensive heart disease without heart failure: Secondary | ICD-10-CM | POA: Diagnosis not present

## 2020-07-07 DIAGNOSIS — J449 Chronic obstructive pulmonary disease, unspecified: Secondary | ICD-10-CM | POA: Diagnosis not present

## 2020-07-07 DIAGNOSIS — H9190 Unspecified hearing loss, unspecified ear: Secondary | ICD-10-CM | POA: Diagnosis not present

## 2020-07-07 DIAGNOSIS — R69 Illness, unspecified: Secondary | ICD-10-CM | POA: Diagnosis not present

## 2020-07-09 DIAGNOSIS — R69 Illness, unspecified: Secondary | ICD-10-CM | POA: Diagnosis not present

## 2020-07-09 DIAGNOSIS — K219 Gastro-esophageal reflux disease without esophagitis: Secondary | ICD-10-CM | POA: Diagnosis not present

## 2020-07-09 DIAGNOSIS — E1165 Type 2 diabetes mellitus with hyperglycemia: Secondary | ICD-10-CM | POA: Diagnosis not present

## 2020-07-09 DIAGNOSIS — R600 Localized edema: Secondary | ICD-10-CM | POA: Diagnosis not present

## 2020-07-09 DIAGNOSIS — J449 Chronic obstructive pulmonary disease, unspecified: Secondary | ICD-10-CM | POA: Diagnosis not present

## 2020-07-09 DIAGNOSIS — I482 Chronic atrial fibrillation, unspecified: Secondary | ICD-10-CM | POA: Diagnosis not present

## 2020-07-09 DIAGNOSIS — M818 Other osteoporosis without current pathological fracture: Secondary | ICD-10-CM | POA: Diagnosis not present

## 2020-07-09 DIAGNOSIS — I739 Peripheral vascular disease, unspecified: Secondary | ICD-10-CM | POA: Diagnosis not present

## 2020-07-09 DIAGNOSIS — E782 Mixed hyperlipidemia: Secondary | ICD-10-CM | POA: Diagnosis not present

## 2020-07-09 DIAGNOSIS — D649 Anemia, unspecified: Secondary | ICD-10-CM | POA: Diagnosis not present

## 2020-07-09 DIAGNOSIS — I10 Essential (primary) hypertension: Secondary | ICD-10-CM | POA: Diagnosis not present

## 2020-07-10 DIAGNOSIS — J441 Chronic obstructive pulmonary disease with (acute) exacerbation: Secondary | ICD-10-CM | POA: Diagnosis not present

## 2020-07-10 DIAGNOSIS — I482 Chronic atrial fibrillation, unspecified: Secondary | ICD-10-CM | POA: Diagnosis not present

## 2020-07-10 DIAGNOSIS — H9190 Unspecified hearing loss, unspecified ear: Secondary | ICD-10-CM | POA: Diagnosis not present

## 2020-07-10 DIAGNOSIS — I428 Other cardiomyopathies: Secondary | ICD-10-CM | POA: Diagnosis not present

## 2020-07-10 DIAGNOSIS — E785 Hyperlipidemia, unspecified: Secondary | ICD-10-CM | POA: Diagnosis not present

## 2020-07-10 DIAGNOSIS — R69 Illness, unspecified: Secondary | ICD-10-CM | POA: Diagnosis not present

## 2020-07-10 DIAGNOSIS — I2510 Atherosclerotic heart disease of native coronary artery without angina pectoris: Secondary | ICD-10-CM | POA: Diagnosis not present

## 2020-07-10 DIAGNOSIS — K219 Gastro-esophageal reflux disease without esophagitis: Secondary | ICD-10-CM | POA: Diagnosis not present

## 2020-07-10 DIAGNOSIS — I119 Hypertensive heart disease without heart failure: Secondary | ICD-10-CM | POA: Diagnosis not present

## 2020-07-11 DIAGNOSIS — I119 Hypertensive heart disease without heart failure: Secondary | ICD-10-CM | POA: Diagnosis not present

## 2020-07-11 DIAGNOSIS — J441 Chronic obstructive pulmonary disease with (acute) exacerbation: Secondary | ICD-10-CM | POA: Diagnosis not present

## 2020-07-11 DIAGNOSIS — I428 Other cardiomyopathies: Secondary | ICD-10-CM | POA: Diagnosis not present

## 2020-07-11 DIAGNOSIS — I482 Chronic atrial fibrillation, unspecified: Secondary | ICD-10-CM | POA: Diagnosis not present

## 2020-07-11 DIAGNOSIS — E785 Hyperlipidemia, unspecified: Secondary | ICD-10-CM | POA: Diagnosis not present

## 2020-07-11 DIAGNOSIS — R69 Illness, unspecified: Secondary | ICD-10-CM | POA: Diagnosis not present

## 2020-07-11 DIAGNOSIS — I2510 Atherosclerotic heart disease of native coronary artery without angina pectoris: Secondary | ICD-10-CM | POA: Diagnosis not present

## 2020-07-11 DIAGNOSIS — H9190 Unspecified hearing loss, unspecified ear: Secondary | ICD-10-CM | POA: Diagnosis not present

## 2020-07-11 DIAGNOSIS — K219 Gastro-esophageal reflux disease without esophagitis: Secondary | ICD-10-CM | POA: Diagnosis not present

## 2020-07-12 DIAGNOSIS — M818 Other osteoporosis without current pathological fracture: Secondary | ICD-10-CM | POA: Diagnosis not present

## 2020-07-12 DIAGNOSIS — R69 Illness, unspecified: Secondary | ICD-10-CM | POA: Diagnosis not present

## 2020-07-12 DIAGNOSIS — I119 Hypertensive heart disease without heart failure: Secondary | ICD-10-CM | POA: Diagnosis not present

## 2020-07-12 DIAGNOSIS — J441 Chronic obstructive pulmonary disease with (acute) exacerbation: Secondary | ICD-10-CM | POA: Diagnosis not present

## 2020-07-12 DIAGNOSIS — I482 Chronic atrial fibrillation, unspecified: Secondary | ICD-10-CM | POA: Diagnosis not present

## 2020-07-12 DIAGNOSIS — H9190 Unspecified hearing loss, unspecified ear: Secondary | ICD-10-CM | POA: Diagnosis not present

## 2020-07-12 DIAGNOSIS — D649 Anemia, unspecified: Secondary | ICD-10-CM | POA: Diagnosis not present

## 2020-07-12 DIAGNOSIS — K219 Gastro-esophageal reflux disease without esophagitis: Secondary | ICD-10-CM | POA: Diagnosis not present

## 2020-07-12 DIAGNOSIS — I10 Essential (primary) hypertension: Secondary | ICD-10-CM | POA: Diagnosis not present

## 2020-07-12 DIAGNOSIS — I739 Peripheral vascular disease, unspecified: Secondary | ICD-10-CM | POA: Diagnosis not present

## 2020-07-12 DIAGNOSIS — I428 Other cardiomyopathies: Secondary | ICD-10-CM | POA: Diagnosis not present

## 2020-07-12 DIAGNOSIS — E785 Hyperlipidemia, unspecified: Secondary | ICD-10-CM | POA: Diagnosis not present

## 2020-07-12 DIAGNOSIS — I2510 Atherosclerotic heart disease of native coronary artery without angina pectoris: Secondary | ICD-10-CM | POA: Diagnosis not present

## 2020-07-12 DIAGNOSIS — R600 Localized edema: Secondary | ICD-10-CM | POA: Diagnosis not present

## 2020-07-12 DIAGNOSIS — J449 Chronic obstructive pulmonary disease, unspecified: Secondary | ICD-10-CM | POA: Diagnosis not present

## 2020-07-12 DIAGNOSIS — E782 Mixed hyperlipidemia: Secondary | ICD-10-CM | POA: Diagnosis not present

## 2020-07-19 DIAGNOSIS — J449 Chronic obstructive pulmonary disease, unspecified: Secondary | ICD-10-CM | POA: Diagnosis not present

## 2020-07-20 ENCOUNTER — Ambulatory Visit: Payer: Medicare HMO | Admitting: Cardiology

## 2020-07-21 ENCOUNTER — Ambulatory Visit: Payer: Medicare HMO | Admitting: Student

## 2020-07-26 DIAGNOSIS — I482 Chronic atrial fibrillation, unspecified: Secondary | ICD-10-CM | POA: Diagnosis not present

## 2020-07-26 DIAGNOSIS — I119 Hypertensive heart disease without heart failure: Secondary | ICD-10-CM | POA: Diagnosis not present

## 2020-07-26 DIAGNOSIS — E785 Hyperlipidemia, unspecified: Secondary | ICD-10-CM | POA: Diagnosis not present

## 2020-07-26 DIAGNOSIS — I2510 Atherosclerotic heart disease of native coronary artery without angina pectoris: Secondary | ICD-10-CM | POA: Diagnosis not present

## 2020-07-26 DIAGNOSIS — K219 Gastro-esophageal reflux disease without esophagitis: Secondary | ICD-10-CM | POA: Diagnosis not present

## 2020-07-26 DIAGNOSIS — J441 Chronic obstructive pulmonary disease with (acute) exacerbation: Secondary | ICD-10-CM | POA: Diagnosis not present

## 2020-07-26 DIAGNOSIS — R69 Illness, unspecified: Secondary | ICD-10-CM | POA: Diagnosis not present

## 2020-07-26 DIAGNOSIS — H9190 Unspecified hearing loss, unspecified ear: Secondary | ICD-10-CM | POA: Diagnosis not present

## 2020-07-26 DIAGNOSIS — I428 Other cardiomyopathies: Secondary | ICD-10-CM | POA: Diagnosis not present

## 2020-07-27 DIAGNOSIS — I482 Chronic atrial fibrillation, unspecified: Secondary | ICD-10-CM | POA: Diagnosis not present

## 2020-07-27 DIAGNOSIS — I428 Other cardiomyopathies: Secondary | ICD-10-CM | POA: Diagnosis not present

## 2020-07-27 DIAGNOSIS — R69 Illness, unspecified: Secondary | ICD-10-CM | POA: Diagnosis not present

## 2020-07-27 DIAGNOSIS — H9190 Unspecified hearing loss, unspecified ear: Secondary | ICD-10-CM | POA: Diagnosis not present

## 2020-07-27 DIAGNOSIS — E785 Hyperlipidemia, unspecified: Secondary | ICD-10-CM | POA: Diagnosis not present

## 2020-07-27 DIAGNOSIS — J441 Chronic obstructive pulmonary disease with (acute) exacerbation: Secondary | ICD-10-CM | POA: Diagnosis not present

## 2020-07-27 DIAGNOSIS — I119 Hypertensive heart disease without heart failure: Secondary | ICD-10-CM | POA: Diagnosis not present

## 2020-07-27 DIAGNOSIS — I2510 Atherosclerotic heart disease of native coronary artery without angina pectoris: Secondary | ICD-10-CM | POA: Diagnosis not present

## 2020-07-27 DIAGNOSIS — K219 Gastro-esophageal reflux disease without esophagitis: Secondary | ICD-10-CM | POA: Diagnosis not present

## 2020-07-31 ENCOUNTER — Other Ambulatory Visit: Payer: Self-pay

## 2020-07-31 ENCOUNTER — Encounter: Payer: Self-pay | Admitting: Student

## 2020-07-31 ENCOUNTER — Ambulatory Visit: Payer: Medicare HMO | Admitting: Student

## 2020-07-31 VITALS — BP 132/42 | HR 83 | Ht 64.0 in | Wt 135.0 lb

## 2020-07-31 DIAGNOSIS — I428 Other cardiomyopathies: Secondary | ICD-10-CM | POA: Diagnosis not present

## 2020-07-31 DIAGNOSIS — I4819 Other persistent atrial fibrillation: Secondary | ICD-10-CM

## 2020-07-31 DIAGNOSIS — D6869 Other thrombophilia: Secondary | ICD-10-CM

## 2020-07-31 LAB — BASIC METABOLIC PANEL
BUN/Creatinine Ratio: 18 (ref 12–28)
BUN: 15 mg/dL (ref 8–27)
CO2: 25 mmol/L (ref 20–29)
Calcium: 9.2 mg/dL (ref 8.7–10.3)
Chloride: 98 mmol/L (ref 96–106)
Creatinine, Ser: 0.83 mg/dL (ref 0.57–1.00)
Glucose: 74 mg/dL (ref 65–99)
Potassium: 4.2 mmol/L (ref 3.5–5.2)
Sodium: 136 mmol/L (ref 134–144)
eGFR: 69 mL/min/{1.73_m2} (ref >59)

## 2020-07-31 LAB — MAGNESIUM: Magnesium: 1.9 mg/dL (ref 1.6–2.3)

## 2020-07-31 NOTE — Patient Instructions (Signed)
Medication Instructions:  Your physician recommends that you continue on your current medications as directed. Please refer to the Current Medication list given to you today.  *If you need a refill on your cardiac medications before your next appointment, please call your pharmacy*   Lab Work: TODAY: BMET, MAG  If you have labs (blood work) drawn today and your tests are completely normal, you will receive your results only by: Marland Kitchen MyChart Message (if you have MyChart) OR . A paper copy in the mail If you have any lab test that is abnormal or we need to change your treatment, we will call you to review the results.   Follow-Up: At [facility], you and your health needs are our priority.  As part of our continuing mission to provide you with exceptional heart care, we have created designated Provider Care Teams.  These Care Teams include your primary Cardiologist (physician) and Advanced Practice Providers (APPs -  Physician Assistants and Nurse Practitioners) who all work together to provide you with the care you need, when you need it.  We recommend signing up for the patient portal called "MyChart".  Sign up information is provided on this After Visit Summary.  MyChart is used to connect with patients for Virtual Visits (Telemedicine).  Patients are able to view lab/test results, encounter notes, upcoming appointments, etc.  Non-urgent messages can be sent to your provider as well.   To learn more about what you can do with MyChart, go to [email or web address].    Your next appointment:   As scheduled

## 2020-07-31 NOTE — Progress Notes (Signed)
PCP:  Celene Squibb, MD Primary Cardiologist: None Electrophysiologist: Thompson Grayer, MD   DELAYNI STREED is a 87 y.o. female seen today for Thompson Grayer, MD for routine electrophysiology followup.  Since last being seen in AF clinic the patient reports doing well.  She has not felt any recurrent AF. She has mild SOB with moderate activity that is chronic, and has somewhat improved in sinus. She denies chest pain. SOB walking the grocery store, uses a cart. No edema.   Past Medical History:  Diagnosis Date  . A-fib ([place])   . Anxiety   . Cancer ([place])    breast  . COPD (chronic obstructive pulmonary disease) ([place])   . Depression   . Hypertension   . TIA (transient ischemic attack)    remote   Past Surgical History:  Procedure Laterality Date  . ABDOMINAL HYSTERECTOMY    . APPENDECTOMY    . BACK SURGERY     total of five surgeries  . BREAST BIOPSY Left    benign  . BREAST CAPSULECTOMY WITH IMPLANT EXCHANGE Right 05/27/2016   Procedure: REMOVAL AND REPLACEMENT OF RIGHT BREAST IMPLANT AND BREAST CAPSULECTOMY;  Surgeon: Cristine Polio, MD;  Location: [facility];  Service: Plastics;  Laterality: Right;  . CARDIAC CATHETERIZATION    . CARDIOVERSION N/A 05/17/2020   Procedure: CARDIOVERSION;  Surgeon: Acie Fredrickson Wonda Cheng, MD;  Location: [facility];  Service: Cardiovascular;  Laterality: N/A;  . CHOLECYSTECTOMY    . COLONOSCOPY  08/2014   Dr. Britta Mccreedy: Small sessile polyp at the cecum, removed, tubular adenoma  . ESOPHAGOGASTRODUODENOSCOPY  06/2013   Dr. Britta Mccreedy: Hiatal hernia, Schatzki ring, nonobstructive.  Marland Kitchen MASTECTOMY     right   . ORIF ANKLE FRACTURE Left 06/01/2014   Procedure: OPEN REDUCTION INTERNAL FIXATION (ORIF) LEFT ANKLE FRACTURE;  Surgeon: Marybelle Killings, MD;  Location: [facility];  Service: Orthopedics;  Laterality: Left;  . ROTATOR CUFF REPAIR     left  . TEE WITHOUT CARDIOVERSION N/A 05/17/2020   Procedure: TRANSESOPHAGEAL ECHOCARDIOGRAM (TEE);  Surgeon: Acie Fredrickson  Wonda Cheng, MD;  Location: [facility];  Service: Cardiovascular;  Laterality: N/A;  . TOTAL HIP ARTHROPLASTY     right  . TOTAL KNEE ARTHROPLASTY     bilateral    Current Outpatient Medications  Medication Sig Dispense Refill  . albuterol (PROVENTIL HFA;VENTOLIN HFA) 108 (90 Base) MCG/ACT inhaler Inhale 2 puffs into the lungs every 6 (six) hours as needed for wheezing or shortness of breath.    Marland Kitchen albuterol (PROVENTIL) (2.5 MG/3ML) 0.083% nebulizer solution Take 3 mLs (2.5 mg total) by nebulization every 6 (six) hours as needed for wheezing or shortness of breath. 360 mL 5  . atorvastatin (LIPITOR) 20 MG tablet Take 20 mg by mouth at bedtime.    . clonazePAM (KLONOPIN) 1 MG tablet Take 1 tablet (1 mg total) by mouth at bedtime. 30 tablet 0  . diltiazem (CARDIZEM CD) 120 MG 24 hr capsule Take 1 capsule (120 mg total) by mouth daily. (long acting) 30 capsule 6  . diltiazem (CARDIZEM) 30 MG tablet Take 1 tablet (30 mg total) by mouth every 8 (eight) hours as needed (for heartrate greater than 100). 30 tablet 2  . dofetilide (TIKOSYN) 125 MCG capsule Take 1 capsule (125 mcg total) by mouth 2 (two) times daily. 60 capsule 0  . ELIQUIS 5 MG TABS tablet Take 1 tablet by mouth twice daily 60 tablet 6  . esomeprazole (NEXIUM) 20 MG capsule Take 20 mg  by mouth daily.    . famotidine (PEPCID) 20 MG tablet Take 20 mg by mouth at bedtime.    . furosemide (LASIX) 20 MG tablet Take 20 mg by mouth daily.    Marland Kitchen ibandronate (BONIVA) 150 MG tablet Take 150 mg by mouth every 30 (thirty) days.    Marland Kitchen losartan (COZAAR) 50 MG tablet Take 50 mg by mouth daily.    . Magnesium Oxide 250 MG TABS Take 1 tablet (250 mg total) by mouth daily. 30 tablet 5  . metoprolol succinate (TOPROL XL) 50 MG 24 hr tablet Take 1 tablet (50 mg total) by mouth daily. Take with or immediately following a meal. 30 tablet 3  . Multiple Vitamin (MULTIVITAMIN WITH MINERALS) TABS tablet Take 1 tablet by mouth daily.    . OXYGEN Inhale 3 L into  the lungs continuous.    . potassium chloride (KLOR-CON) 10 MEQ tablet Take 2 tablets (20 mEq total) by mouth daily. 90 tablet 3  . Probiotic Product (PROBIOTIC DAILY) CAPS Take 1 capsule by mouth daily.    Marland Kitchen Respiratory Therapy Supplies (FLUTTER) DEVI 1 Device by Does not apply route in the morning, at noon, in the evening, and at bedtime. 1 each 0  . SILTUSSIN SA 100 MG/5ML syrup Take 5 mLs by mouth every 4 (four) hours as needed.    . TRELEGY ELLIPTA 100-62.5-25 MCG/INH AEPB INHALE ONE PUFF into THE lungs DAILY 60 each 5  . triamcinolone cream (KENALOG) 0.1 % Apply 1 application topically daily as needed (hemorrhoids).    . venlafaxine (EFFEXOR) 75 MG tablet Take 75-150 mg by mouth See admin instructions. Take 150 mg in the morning and 75 mg in the evening     No current facility-administered medications for this visit.    Allergies  Allergen Reactions  . Morphine And Related Nausea And Vomiting  . Penicillins Rash    Reaction: unknown  . Sulfa Antibiotics Nausea And Vomiting    Social History   Socioeconomic History  . Marital status: Widowed    Spouse name: Not on file  . Number of children: Not on file  . Years of education: Not on file  . Highest education level: Not on file  Occupational History  . Not on file  Tobacco Use  . Smoking status: Never Smoker  . Smokeless tobacco: Never Used  Vaping Use  . Vaping Use: Never used  Substance and Sexual Activity  . Alcohol use: No    Alcohol/week: 0.0 standard drinks  . Drug use: No  . Sexual activity: Not on file  Other Topics Concern  . Not on file  Social History Narrative  . Not on file   Social Determinants of Health   Financial Resource Strain: Not on file  Food Insecurity: Not on file  Transportation Needs: Not on file  Physical Activity: Not on file  Stress: Not on file  Social Connections: Not on file  Intimate Partner Violence: Not on file     Review of Systems: General: No chills, fever, night  sweats or weight changes  Cardiovascular:  No chest pain, dyspnea on exertion, edema, orthopnea, palpitations, paroxysmal nocturnal dyspnea Dermatological: No rash, lesions or masses Respiratory: No cough, dyspnea Urologic: No hematuria, dysuria Abdominal: No nausea, vomiting, diarrhea, bright red blood per rectum, melena, or hematemesis Neurologic: No visual changes, weakness, changes in mental status All other systems reviewed and are otherwise negative except as noted above.  Physical Exam: Vitals:   07/31/20 1201  BP: Marland Kitchen)  132/42  Pulse: 83  SpO2: 97%  Weight: 135 lb (61.2 kg)  Height: 5\' 4"  (1.626 m)    GEN- The patient is well appearing, alert and oriented x 3 today.   HEENT: normocephalic, atraumatic; sclera clear, conjunctiva pink; hearing intact; oropharynx clear; neck supple, no JVP Lymph- no cervical lymphadenopathy Lungs- Clear to ausculation bilaterally, normal work of breathing.  No wheezes, rales, rhonchi Heart- Regular rate and rhythm, no murmurs, rubs or gallops, PMI not laterally displaced GI- soft, non-tender, non-distended, bowel sounds present, no hepatosplenomegaly Extremities- no clubbing, cyanosis, or edema; DP/PT/radial pulses 2+ bilaterally MS- no significant deformity or atrophy Skin- warm and dry, no rash or lesion Psych- euthymic mood, full affect Neuro- strength and sensation are intact  EKG is ordered. Personal review of EKG from today shows NSR at 83 bpm with stable QTc at ~460 ms  Additional studies reviewed include: Previous EP and AF office notes  Assessment and Plan:  1. Persistent atrial fibrillation EKG today shows NSR with stable QTc Continue tikosyn 125 mcg BID She is not candidate for ablation Continue eliquis for CHA2DS2VASC of at least 8.    2. NICM Volume status stable on exam Echo TEE 05/17/2020 with LVEF 30-35%. Consider repeating in several months of maintaining NSR. Continue losartan and Toprol. Medication titration has been  limited by hypotension, poor candidate for Entresto with overall soft pressures.  Could consider low dose spiro if EF remains depressed despite NSR.    3. Secondary Hypercoagulable State (ICD10:  D68.69) The patient is at significant risk for stroke/thromboembolism based upon her CHA2DS2-VASc Score of 8.  Continue Apixaban (Eliquis).   Shirley Friar, PA-C  07/31/20 12:27 PM

## 2020-08-04 DIAGNOSIS — J449 Chronic obstructive pulmonary disease, unspecified: Secondary | ICD-10-CM | POA: Diagnosis not present

## 2020-08-06 DIAGNOSIS — J449 Chronic obstructive pulmonary disease, unspecified: Secondary | ICD-10-CM | POA: Diagnosis not present

## 2020-08-19 DIAGNOSIS — J449 Chronic obstructive pulmonary disease, unspecified: Secondary | ICD-10-CM | POA: Diagnosis not present

## 2020-08-30 DIAGNOSIS — L110 Acquired keratosis follicularis: Secondary | ICD-10-CM | POA: Diagnosis not present

## 2020-08-30 DIAGNOSIS — M79672 Pain in left foot: Secondary | ICD-10-CM | POA: Diagnosis not present

## 2020-08-30 DIAGNOSIS — I739 Peripheral vascular disease, unspecified: Secondary | ICD-10-CM | POA: Diagnosis not present

## 2020-08-30 DIAGNOSIS — M79671 Pain in right foot: Secondary | ICD-10-CM | POA: Diagnosis not present

## 2020-08-31 DIAGNOSIS — H60549 Acute eczematoid otitis externa, unspecified ear: Secondary | ICD-10-CM | POA: Diagnosis not present

## 2020-08-31 DIAGNOSIS — H6123 Impacted cerumen, bilateral: Secondary | ICD-10-CM | POA: Diagnosis not present

## 2020-09-04 DIAGNOSIS — J449 Chronic obstructive pulmonary disease, unspecified: Secondary | ICD-10-CM | POA: Diagnosis not present

## 2020-09-06 ENCOUNTER — Encounter: Payer: Self-pay | Admitting: Cardiology

## 2020-09-06 ENCOUNTER — Ambulatory Visit: Payer: Medicare HMO | Admitting: Cardiology

## 2020-09-06 VITALS — BP 128/68 | HR 92 | Ht 60.0 in | Wt 131.2 lb

## 2020-09-06 DIAGNOSIS — I5022 Chronic systolic (congestive) heart failure: Secondary | ICD-10-CM | POA: Diagnosis not present

## 2020-09-06 DIAGNOSIS — I4819 Other persistent atrial fibrillation: Secondary | ICD-10-CM | POA: Diagnosis not present

## 2020-09-06 DIAGNOSIS — I10 Essential (primary) hypertension: Secondary | ICD-10-CM | POA: Diagnosis not present

## 2020-09-06 DIAGNOSIS — J449 Chronic obstructive pulmonary disease, unspecified: Secondary | ICD-10-CM | POA: Diagnosis not present

## 2020-09-06 NOTE — Progress Notes (Signed)
Clinical Summary Amanda Palmer is a 87 y.o.female former patient of Dr Bronson Ing, this is our first visit together .  1.Peristent afib - she is on dofetilide, followed by EP - from notes not an ablation candidate - compliant with meds    2. Chronic systolic HF - 63/8177 TTE: LVEF 60-65% - 05/2020 TEE: LVEF 30-35%. This was around the time she had issues with afib with RVR - medical therapy limited by low bp's  - chronic SOB unchanged she relates to her COPD   3. HTN - compliant with meds    4. Hyperlpidemia - she is onstatin  5. SOB/Chronic lung disease - on home O2, followed by pulmonary   6. Vocal cord dysfunction   6. Valvular heart disease - 05/2020 TEE mod to severe, mod MR   Sister of Amanda Palmer, former patient of mine who passed way in 2020 Past Medical History:  Diagnosis Date   A-fib (Amanda Palmer)    Anxiety    Cancer ([place])    breast   COPD (chronic obstructive pulmonary disease) ([place])    Depression    Hypertension    TIA (transient ischemic attack)    remote     Allergies  Allergen Reactions   Morphine And Related Nausea And Vomiting   Penicillins Rash    Reaction: unknown   Sulfa Antibiotics Nausea And Vomiting     Current Outpatient Medications  Medication Sig Dispense Refill   albuterol (PROVENTIL HFA;VENTOLIN HFA) 108 (90 Base) MCG/ACT inhaler Inhale 2 puffs into the lungs every 6 (six) hours as needed for wheezing or shortness of breath.     albuterol (PROVENTIL) (2.5 MG/3ML) 0.083% nebulizer solution Take 3 mLs (2.5 mg total) by nebulization every 6 (six) hours as needed for wheezing or shortness of breath. 360 mL 5   atorvastatin (LIPITOR) 20 MG tablet Take 20 mg by mouth at bedtime.     clonazePAM (KLONOPIN) 1 MG tablet Take 1 tablet (1 mg total) by mouth at bedtime. 30 tablet 0   diltiazem (CARDIZEM CD) 120 MG 24 hr capsule Take 1 capsule (120 mg total) by mouth daily. (long acting) 30 capsule 6   diltiazem (CARDIZEM) 30 MG tablet  Take 1 tablet (30 mg total) by mouth every 8 (eight) hours as needed (for heartrate greater than 100). 30 tablet 2   dofetilide (TIKOSYN) 125 MCG capsule Take 1 capsule (125 mcg total) by mouth 2 (two) times daily. 60 capsule 0   ELIQUIS 5 MG TABS tablet Take 1 tablet by mouth twice daily 60 tablet 6   esomeprazole (NEXIUM) 20 MG capsule Take 20 mg by mouth daily.     famotidine (PEPCID) 20 MG tablet Take 20 mg by mouth at bedtime.     furosemide (LASIX) 20 MG tablet Take 20 mg by mouth daily.     ibandronate (BONIVA) 150 MG tablet Take 150 mg by mouth every 30 (thirty) days.     losartan (COZAAR) 50 MG tablet Take 50 mg by mouth daily.     Magnesium Oxide 250 MG TABS Take 1 tablet (250 mg total) by mouth daily. 30 tablet 5   metoprolol succinate (TOPROL XL) 50 MG 24 hr tablet Take 1 tablet (50 mg total) by mouth daily. Take with or immediately following a meal. 30 tablet 3   Multiple Vitamin (MULTIVITAMIN WITH MINERALS) TABS tablet Take 1 tablet by mouth daily.     OXYGEN Inhale 3 L into the lungs continuous.  potassium chloride (KLOR-CON) 10 MEQ tablet Take 2 tablets (20 mEq total) by mouth daily. 90 tablet 3   Probiotic Product (PROBIOTIC DAILY) CAPS Take 1 capsule by mouth daily.     Respiratory Therapy Supplies (FLUTTER) DEVI 1 Device by Does not apply route in the morning, at noon, in the evening, and at bedtime. 1 each 0   SILTUSSIN SA 100 MG/5ML syrup Take 5 mLs by mouth every 4 (four) hours as needed.     TRELEGY ELLIPTA 100-62.5-25 MCG/INH AEPB INHALE ONE PUFF into THE lungs DAILY 60 each 5   triamcinolone cream (KENALOG) 0.1 % Apply 1 application topically daily as needed (hemorrhoids).     venlafaxine (EFFEXOR) 75 MG tablet Take 75-150 mg by mouth See admin instructions. Take 150 mg in the morning and 75 mg in the evening     No current facility-administered medications for this visit.     Past Surgical History:  Procedure Laterality Date   ABDOMINAL HYSTERECTOMY      APPENDECTOMY     BACK SURGERY     total of five surgeries   BREAST BIOPSY Left    benign   BREAST CAPSULECTOMY WITH IMPLANT EXCHANGE Right 05/27/2016   Procedure: REMOVAL AND REPLACEMENT OF RIGHT BREAST IMPLANT AND BREAST CAPSULECTOMY;  Surgeon: Amanda Polio, MD;  Location: [facility];  Service: Plastics;  Laterality: Right;   CARDIAC CATHETERIZATION     CARDIOVERSION N/A 05/17/2020   Procedure: CARDIOVERSION;  Surgeon: Amanda Fredrickson Wonda Cheng, MD;  Location: [facility];  Service: Cardiovascular;  Laterality: N/A;   CHOLECYSTECTOMY     COLONOSCOPY  08/2014   Dr. Britta Palmer: Small sessile polyp at the cecum, removed, tubular adenoma   ESOPHAGOGASTRODUODENOSCOPY  06/2013   Dr. Britta Palmer: Hiatal hernia, Schatzki ring, nonobstructive.   MASTECTOMY     right    ORIF ANKLE FRACTURE Left 06/01/2014   Procedure: OPEN REDUCTION INTERNAL FIXATION (ORIF) LEFT ANKLE FRACTURE;  Surgeon: Amanda Killings, MD;  Location: [facility];  Service: Orthopedics;  Laterality: Left;   ROTATOR CUFF REPAIR     left   TEE WITHOUT CARDIOVERSION N/A 05/17/2020   Procedure: TRANSESOPHAGEAL ECHOCARDIOGRAM (TEE);  Surgeon: Amanda Fredrickson Wonda Cheng, MD;  Location: [facility];  Service: Cardiovascular;  Laterality: N/A;   TOTAL HIP ARTHROPLASTY     right   TOTAL KNEE ARTHROPLASTY     bilateral     Allergies  Allergen Reactions   Morphine And Related Nausea And Vomiting   Penicillins Rash    Reaction: unknown   Sulfa Antibiotics Nausea And Vomiting      Family History  Problem Relation Age of Onset   Heart disease Brother    Heart disease Sister    Breast cancer Sister    Leukemia Brother    Breast cancer Sister    Breast cancer Other        one deceased, one living   Colon cancer Neg Hx      Social History Amanda Palmer reports that she has never smoked. She has never used smokeless tobacco. Amanda Palmer reports no history of alcohol use.   Review of Systems CONSTITUTIONAL: No weight loss, fever, chills,  weakness or fatigue.  HEENT: Eyes: No visual loss, blurred vision, double vision or yellow sclerae.No hearing loss, sneezing, congestion, runny nose or sore throat.  SKIN: No rash or itching.  CARDIOVASCULAR: per hpi RESPIRATORY: No shortness of breath, cough or sputum.  GASTROINTESTINAL: No anorexia, nausea, vomiting or diarrhea. No abdominal pain or blood.  GENITOURINARY: No burning on urination, no polyuria NEUROLOGICAL: No headache, dizziness, syncope, paralysis, ataxia, numbness or tingling in the extremities. No change in bowel or bladder control.  MUSCULOSKELETAL: No muscle, back pain, joint pain or stiffness.  LYMPHATICS: No enlarged nodes. No history of splenectomy.  PSYCHIATRIC: No history of depression or anxiety.  ENDOCRINOLOGIC: No reports of sweating, cold or heat intolerance. No polyuria or polydipsia.  Marland Kitchen   Physical Examination Today's Vitals   09/06/20 1421  BP: 128/68  Pulse: 92  SpO2: 99%  Weight: 131 lb 3.2 oz (59.5 kg)  Height: 5' (1.524 m)   Body mass index is 25.62 kg/m.  Gen: resting comfortably, no acute distress HEENT: no scleral icterus, pupils equal round and reactive, no palptable cervical adenopathy,  CV: RRR, no m/r/g no jvd Resp: Clear to auscultation bilaterally GI: abdomen is soft, non-tender, non-distended, normal bowel sounds, no hepatosplenomegaly MSK: extremities are warm, no edema.  Skin: warm, no rash Neuro:  no focal deficits Psych: appropriate affect     Assessment and Plan  Afib - has done well on dofetilide, EKG today shows she is is SR, normal QTc - continue current meds including anticoag  2. Chronic systolic HF -prior drop in LVEF likely tachymediated as it was in the setting of uncontrolled afib - repeat echo late July, if ongoing dysfunction would need adjustements to medications  3. HTN - at goal, continue current meds   F/u 4 months.       Arnoldo Lenis, M.D.

## 2020-09-06 NOTE — Patient Instructions (Addendum)
Medication Instructions:  Your physician recommends that you continue on your current medications as directed. Please refer to the Current Medication list given to you today.  Labwork: none  Testing/Procedures: Your physician has requested that you have an echocardiogram late July 2022. Echocardiography is a painless test that uses sound waves to create images of your heart. It provides your doctor with information about the size and shape of your heart and how well your heart's chambers and valves are working. This procedure takes approximately one hour. There are no restrictions for this procedure.  Follow-Up: Your physician recommends that you schedule a follow-up appointment in: 4 months  Any Other Special Instructions Will Be Listed Below (If Applicable).  If you need a refill on your cardiac medications before your next appointment, please call your pharmacy.

## 2020-09-08 DIAGNOSIS — K219 Gastro-esophageal reflux disease without esophagitis: Secondary | ICD-10-CM | POA: Diagnosis not present

## 2020-09-08 DIAGNOSIS — E1165 Type 2 diabetes mellitus with hyperglycemia: Secondary | ICD-10-CM | POA: Diagnosis not present

## 2020-09-18 DIAGNOSIS — J449 Chronic obstructive pulmonary disease, unspecified: Secondary | ICD-10-CM | POA: Diagnosis not present

## 2020-09-28 ENCOUNTER — Ambulatory Visit: Payer: Medicare HMO | Admitting: Cardiology

## 2020-10-04 DIAGNOSIS — J449 Chronic obstructive pulmonary disease, unspecified: Secondary | ICD-10-CM | POA: Diagnosis not present

## 2020-10-05 ENCOUNTER — Other Ambulatory Visit: Payer: Self-pay

## 2020-10-05 ENCOUNTER — Ambulatory Visit (HOSPITAL_COMMUNITY)
Admission: RE | Admit: 2020-10-05 | Discharge: 2020-10-05 | Disposition: A | Discharge status: Home / Self Care | Payer: Medicare HMO | Source: Ambulatory Visit | Attending: Cardiology | Admitting: Cardiology

## 2020-10-05 DIAGNOSIS — I4819 Other persistent atrial fibrillation: Secondary | ICD-10-CM | POA: Insufficient documentation

## 2020-10-05 DIAGNOSIS — I5022 Chronic systolic (congestive) heart failure: Secondary | ICD-10-CM

## 2020-10-05 LAB — ECHOCARDIOGRAM COMPLETE
Area-P 1/2: 3.27 cm2
MV M vel: 5.11 m/s
MV Peak grad: 104.4 mmHg
S' Lateral: 3 cm

## 2020-10-05 NOTE — Progress Notes (Signed)
*  PRELIMINARY RESULTS* Echocardiogram 2D Echocardiogram has been performed.  Amanda Palmer 10/05/2020, 2:55 PM

## 2020-10-06 DIAGNOSIS — J449 Chronic obstructive pulmonary disease, unspecified: Secondary | ICD-10-CM | POA: Diagnosis not present

## 2020-10-10 ENCOUNTER — Telehealth: Payer: Self-pay | Admitting: Cardiology

## 2020-10-10 NOTE — Telephone Encounter (Signed)
Echo result given to patient

## 2020-10-10 NOTE — Telephone Encounter (Signed)
Returning Cathey's call  

## 2020-10-18 ENCOUNTER — Other Ambulatory Visit (HOSPITAL_COMMUNITY): Payer: Self-pay | Admitting: Internal Medicine

## 2020-10-18 DIAGNOSIS — Z1231 Encounter for screening mammogram for malignant neoplasm of breast: Secondary | ICD-10-CM

## 2020-10-19 DIAGNOSIS — J449 Chronic obstructive pulmonary disease, unspecified: Secondary | ICD-10-CM | POA: Diagnosis not present

## 2020-10-30 ENCOUNTER — Ambulatory Visit: Payer: Medicare HMO | Admitting: Pulmonary Disease

## 2020-10-30 ENCOUNTER — Other Ambulatory Visit: Payer: Self-pay

## 2020-10-30 ENCOUNTER — Encounter: Payer: Self-pay | Admitting: Pulmonary Disease

## 2020-10-30 VITALS — BP 150/68 | HR 85 | Temp 98.7°F | Ht 60.0 in | Wt 132.0 lb

## 2020-10-30 DIAGNOSIS — J439 Emphysema, unspecified: Secondary | ICD-10-CM | POA: Diagnosis not present

## 2020-10-30 DIAGNOSIS — G4736 Sleep related hypoventilation in conditions classified elsewhere: Secondary | ICD-10-CM

## 2020-10-30 DIAGNOSIS — J449 Chronic obstructive pulmonary disease, unspecified: Secondary | ICD-10-CM

## 2020-10-30 NOTE — Progress Notes (Signed)
[facility]  Chief Complaint  Patient presents with   Follow-up    Patient wants to talk about her oxygen since she is doing better since covid. Patient feels like breathing is good. And wants to make sure that she really has COPD.     Constitutional:  BP (!) 150/68 (BP Location: Right Arm, Patient Position: Sitting, Cuff Size: Normal)   Pulse 85   Temp 98.7 F (37.1 C) (Oral)   Ht 5' (1.524 m)   Wt 132 lb (59.9 kg)   SpO2 98%   BMI 25.78 kg/m   Past Medical History:  HTN, Sycope, GERD, A fib, Anxiety, Breast cancer, Depression, HTN, TIA, HLD  Past Surgical History:  Her  has a past surgical history that includes Cardiac catheterization; Total knee arthroplasty; Total hip arthroplasty; Rotator cuff repair; Mastectomy; ORIF ankle fracture (Left, 06/01/2014); Breast capsulectomy with implant exchange (Right, 05/27/2016); Esophagogastroduodenoscopy (06/2013); Colonoscopy (08/2014); Back surgery; Abdominal hysterectomy; Cholecystectomy; Appendectomy; Breast biopsy (Left); TEE without cardioversion (N/A, 05/17/2020); and Cardioversion (N/A, 05/17/2020).  Brief Summary:  Amanda Palmer is a 87 y.o. female with COPD and nocturnal hypoxemia.      Subjective:   She has occasional cough.  Breathing better since her last visit.  Uses oxygen at night, but wants to know if she needs to use all the time during the day.  Has trouble singing in church, but walk to her mailbox and back w/o trouble.  Physical Exam:   Appearance - well kempt   ENMT - no sinus tenderness, no oral exudate, no LAN, Mallampati 3 airway, no stridor  Respiratory - equal breath sounds bilaterally, no wheezing or rales  CV - s1s2 regular rate and rhythm, 2/6 murmur  Ext - no clubbing, no edema  Skin - no rashes  Psych - normal mood and affect     Pulmonary testing:  PFT 11/09/13 >> FEV1 1.10 (54%), FEV1% 68, TLC 5.17 (102%) PFT 11/30/14 >> FEV1 0.89 (44%), FEV1% 64,  TLC 4.42 (87%), RV 3.10 (130%), DLCO 22% RAST 02/23/15 >> negative, IgE 4 A1AT 07/06/19 >> 137, MM  Chest Imaging:  HRCT chest 05/07/19 >> atherosclerosis, scarring at lung bases, numerous small nodules clustered in RML and LLL  Sleep Tests:  PSG 11/21/15 >> AHI 0.8, SpO2 low 90%  Cardiac Tests:  Echo 12/21/19 >> EF 60 to 65%, grade 2 DD, RVSP 44.7 mmHg, mod LA dilation, mod MR  Social History:  She  reports that she has never smoked. She has never used smokeless tobacco. She reports that she does not drink alcohol and does not use drugs.  Family History:  Her family history includes Breast cancer in her sister, sister and another family member; Heart disease in her brother and sister; Leukemia in her brother.     Assessment/Plan:   COPD mixed type. - continue trelegy - prn albuterol - defer repeat PFT at this time since results won't change management options  Nocturnal hypoxemia 2nd to COPD. - 2 liters oxygen at night - goal SpO2 > 90%; she can use oxygen prn during the day  Persistent atrial fibrillation, chronic diastolic CHF, mitral regurgitation, HLD. - followed by Dr. Harl Bowie with [facility]   Time Spent Involved in Patient Care on Day of Examination:  22 minutes  Follow up:   Patient Instructions  Use trelegy one puff daily.  You can use your nebulizer as needed if you are having a cough, wheeze, or chest congestion.  Check your oxygen levels at home - the goal is to keep your oxygen level at 90% or higher.  If it goes below this, then you should use your oxygen during the day.  You should continue to use your oxygen at night.  Follow up in 6 months.  Medication List:   Allergies as of 10/30/2020       Reactions   Morphine And Related Nausea And Vomiting   Penicillins Rash   Reaction: unknown   Sulfa Antibiotics Nausea And Vomiting        Medication List        Accurate as of October 30, 2020 12:07 PM. If you have any questions, ask your nurse  or doctor.          acetic acid-hydrocortisone OTIC solution Commonly known as: VOSOL-HC as needed.   albuterol 108 (90 Base) MCG/ACT inhaler Commonly known as: VENTOLIN HFA Inhale 2 puffs into the lungs every 6 (six) hours as needed for wheezing or shortness of breath.   albuterol (2.5 MG/3ML) 0.083% nebulizer solution Commonly known as: PROVENTIL Take 3 mLs (2.5 mg total) by nebulization every 6 (six) hours as needed for wheezing or shortness of breath.   atorvastatin 20 MG tablet Commonly known as: LIPITOR Take 20 mg by mouth at bedtime.   clonazePAM 1 MG tablet Commonly known as: KLONOPIN Take 1 tablet (1 mg total) by mouth at bedtime.   diltiazem 120 MG 24 hr capsule Commonly known as: CARDIZEM CD Take 1 capsule (120 mg total) by mouth daily. (long acting)   diltiazem 30 MG tablet Commonly known as: CARDIZEM Take 1 tablet (30 mg total) by mouth every 8 (eight) hours as needed (for heartrate greater than 100).   dofetilide 125 MCG capsule Commonly known as: TIKOSYN Take 1 capsule (125 mcg total) by mouth 2 (two) times daily.   Eliquis 5 MG Tabs tablet Generic drug: apixaban Take 1 tablet by mouth twice daily   esomeprazole 20 MG capsule Commonly known as: NEXIUM Take 20 mg by mouth daily.   famotidine 20 MG tablet Commonly known as: PEPCID Take 20 mg by mouth at bedtime.   Flutter Devi 1 Device by Does not apply route in the morning, at noon, in the evening, and at bedtime.   furosemide 20 MG tablet Commonly known as: LASIX Take 20 mg by mouth daily.   ibandronate 150 MG tablet Commonly known as: BONIVA Take 150 mg by mouth every 30 (thirty) days.   losartan 50 MG tablet Commonly known as: COZAAR Take 50 mg by mouth daily.   Magnesium Oxide 250 MG Tabs Take 1 tablet (250 mg total) by mouth daily.   metoprolol succinate 50 MG 24 hr tablet Commonly known as: Toprol XL Take 1 tablet (50 mg total) by mouth daily. Take with or immediately following  a meal.   multivitamin with minerals Tabs tablet Take 1 tablet by mouth daily.   OXYGEN Inhale 3 L into the lungs continuous.   potassium chloride 10 MEQ tablet Commonly known as: KLOR-CON Take 2 tablets (20 mEq total) by mouth daily.   Probiotic Daily Caps Take 1 capsule by mouth daily.   Siltussin SA 100 MG/5ML syrup Generic drug: guaifenesin Take 5 mLs by mouth every 4 (four) hours as needed.   Trelegy Ellipta 100-62.5-25 MCG/INH Aepb Generic drug: Fluticasone-Umeclidin-Vilant INHALE ONE PUFF into THE lungs DAILY   triamcinolone cream 0.1 % Commonly known as: KENALOG Apply 1 application topically daily as needed (hemorrhoids).  venlafaxine 75 MG tablet Commonly known as: EFFEXOR Take 75-150 mg by mouth See admin instructions. Take 150 mg in the morning and 75 mg in the evening        Signature:  Chesley Mires, MD [place] Pager - [phone] 10/30/2020, 12:07 PM

## 2020-10-30 NOTE — Patient Instructions (Signed)
Use trelegy one puff daily.  You can use your nebulizer as needed if you are having a cough, wheeze, or chest congestion.  Check your oxygen levels at home - the goal is to keep your oxygen level at 90% or higher.  If it goes below this, then you should use your oxygen during the day.  You should continue to use your oxygen at night.  Follow up in 6 months.

## 2020-11-03 ENCOUNTER — Other Ambulatory Visit: Payer: Self-pay

## 2020-11-03 ENCOUNTER — Ambulatory Visit (HOSPITAL_COMMUNITY)
Admission: RE | Admit: 2020-11-03 | Discharge: 2020-11-03 | Disposition: A | Discharge status: Home / Self Care | Payer: Medicare HMO | Source: Ambulatory Visit | Attending: Internal Medicine | Admitting: Internal Medicine

## 2020-11-03 DIAGNOSIS — Z1231 Encounter for screening mammogram for malignant neoplasm of breast: Secondary | ICD-10-CM | POA: Diagnosis not present

## 2020-11-04 DIAGNOSIS — J449 Chronic obstructive pulmonary disease, unspecified: Secondary | ICD-10-CM | POA: Diagnosis not present

## 2020-11-05 NOTE — Progress Notes (Signed)
PCP:  Celene Squibb, MD Primary Cardiologist: Carlyle Dolly, MD Electrophysiologist: Thompson Grayer, MD   Amanda Palmer is a 87 y.o. female seen today for Thompson Grayer, MD for routine electrophysiology followup.  Since last being seen in our clinic the patient reports doing very well. She feels much better maintaining NSR on tikosyn. she denies chest pain, palpitations, dyspnea, PND, orthopnea, nausea, vomiting, dizziness, syncope, edema, weight gain, or early satiety.  Past Medical History:  Diagnosis Date   A-fib ([place])    Anxiety    Cancer ([place])    breast   COPD (chronic obstructive pulmonary disease) (HCC)    Depression    Hypertension    TIA (transient ischemic attack)    remote   Past Surgical History:  Procedure Laterality Date   ABDOMINAL HYSTERECTOMY     APPENDECTOMY     BACK SURGERY     total of five surgeries   BREAST BIOPSY Left    benign   BREAST CAPSULECTOMY WITH IMPLANT EXCHANGE Right 05/27/2016   Procedure: REMOVAL AND REPLACEMENT OF RIGHT BREAST IMPLANT AND BREAST CAPSULECTOMY;  Surgeon: Cristine Polio, MD;  Location: [place];  Service: Plastics;  Laterality: Right;   CARDIAC CATHETERIZATION     CARDIOVERSION N/A 05/17/2020   Procedure: CARDIOVERSION;  Surgeon: Acie Fredrickson Wonda Cheng, MD;  Location: [facility] ENDOSCOPY;  Service: Cardiovascular;  Laterality: N/A;   CHOLECYSTECTOMY     COLONOSCOPY  08/2014   Dr. Britta Mccreedy: Small sessile polyp at the cecum, removed, tubular adenoma   ESOPHAGOGASTRODUODENOSCOPY  06/2013   Dr. Britta Mccreedy: Hiatal hernia, Schatzki ring, nonobstructive.   MASTECTOMY     right    ORIF ANKLE FRACTURE Left 06/01/2014   Procedure: OPEN REDUCTION INTERNAL FIXATION (ORIF) LEFT ANKLE FRACTURE;  Surgeon: Marybelle Killings, MD;  Location: [place];  Service: Orthopedics;  Laterality: Left;   ROTATOR CUFF REPAIR     left   TEE WITHOUT CARDIOVERSION N/A 05/17/2020   Procedure: TRANSESOPHAGEAL ECHOCARDIOGRAM (TEE);  Surgeon: Acie Fredrickson Wonda Cheng, MD;   Location: [facility] ENDOSCOPY;  Service: Cardiovascular;  Laterality: N/A;   TOTAL HIP ARTHROPLASTY     right   TOTAL KNEE ARTHROPLASTY     bilateral    Current Outpatient Medications  Medication Sig Dispense Refill   acetic acid-hydrocortisone (VOSOL-HC) OTIC solution as needed.     albuterol (PROVENTIL HFA;VENTOLIN HFA) 108 (90 Base) MCG/ACT inhaler Inhale 2 puffs into the lungs every 6 (six) hours as needed for wheezing or shortness of breath.     albuterol (PROVENTIL) (2.5 MG/3ML) 0.083% nebulizer solution Take 3 mLs (2.5 mg total) by nebulization every 6 (six) hours as needed for wheezing or shortness of breath. 360 mL 5   atorvastatin (LIPITOR) 20 MG tablet Take 20 mg by mouth at bedtime.     clonazePAM (KLONOPIN) 1 MG tablet Take 1 tablet (1 mg total) by mouth at bedtime. 30 tablet 0   diltiazem (CARDIZEM CD) 120 MG 24 hr capsule Take 1 capsule (120 mg total) by mouth daily. (long acting) 30 capsule 6   diltiazem (CARDIZEM) 30 MG tablet Take 1 tablet (30 mg total) by mouth every 8 (eight) hours as needed (for heartrate greater than 100). 30 tablet 2   dofetilide (TIKOSYN) 125 MCG capsule Take 1 capsule (125 mcg total) by mouth 2 (two) times daily. 60 capsule 0   ELIQUIS 5 MG TABS tablet Take 1 tablet by mouth twice daily 60 tablet 6   esomeprazole (NEXIUM) 20 MG capsule Take 20  mg by mouth daily.     famotidine (PEPCID) 20 MG tablet Take 20 mg by mouth at bedtime.     furosemide (LASIX) 20 MG tablet Take 20 mg by mouth daily.     ibandronate (BONIVA) 150 MG tablet Take 150 mg by mouth every 30 (thirty) days.     losartan (COZAAR) 50 MG tablet Take 50 mg by mouth daily.     Magnesium Oxide 250 MG TABS Take 1 tablet (250 mg total) by mouth daily. 30 tablet 5   metoprolol succinate (TOPROL XL) 50 MG 24 hr tablet Take 1 tablet (50 mg total) by mouth daily. Take with or immediately following a meal. 30 tablet 3   Multiple Vitamin (MULTIVITAMIN WITH MINERALS) TABS tablet Take 1 tablet by mouth  daily.     OXYGEN Inhale 3 L into the lungs at bedtime.     potassium chloride (KLOR-CON) 10 MEQ tablet Take 2 tablets (20 mEq total) by mouth daily. 90 tablet 3   Probiotic Product (PROBIOTIC DAILY) CAPS Take 1 capsule by mouth daily.     Respiratory Therapy Supplies (FLUTTER) DEVI 1 Device by Does not apply route in the morning, at noon, in the evening, and at bedtime. 1 each 0   SILTUSSIN SA 100 MG/5ML syrup Take 5 mLs by mouth every 4 (four) hours as needed.     TRELEGY ELLIPTA 100-62.5-25 MCG/INH AEPB INHALE ONE PUFF into THE lungs DAILY 60 each 5   triamcinolone cream (KENALOG) 0.1 % Apply 1 application topically daily as needed (hemorrhoids).     venlafaxine (EFFEXOR) 75 MG tablet Take 75-150 mg by mouth See admin instructions. Take 150 mg in the morning and 75 mg in the evening     No current facility-administered medications for this visit.    Allergies  Allergen Reactions   Morphine And Related Nausea And Vomiting   Penicillins Rash    Reaction: unknown   Sulfa Antibiotics Nausea And Vomiting    Social History   Socioeconomic History   Marital status: Widowed    Spouse name: Not on file   Number of children: Not on file   Years of education: Not on file   Highest education level: Not on file  Occupational History   Not on file  Tobacco Use   Smoking status: Never   Smokeless tobacco: Never  Vaping Use   Vaping Use: Never used  Substance and Sexual Activity   Alcohol use: No    Alcohol/week: 0.0 standard drinks   Drug use: No   Sexual activity: Not on file  Other Topics Concern   Not on file  Social History Narrative   Not on file   Social Determinants of Health   Financial Resource Strain: Not on file  Food Insecurity: Not on file  Transportation Needs: Not on file  Physical Activity: Not on file  Stress: Not on file  Social Connections: Not on file  Intimate Partner Violence: Not on file     Review of Systems: General: No chills, fever, night  sweats or weight changes  Cardiovascular:  No chest pain, dyspnea on exertion, edema, orthopnea, palpitations, paroxysmal nocturnal dyspnea Dermatological: No rash, lesions or masses Respiratory: No cough, dyspnea Urologic: No hematuria, dysuria Abdominal: No nausea, vomiting, diarrhea, bright red blood per rectum, melena, or hematemesis Neurologic: No visual changes, weakness, changes in mental status All other systems reviewed and are otherwise negative except as noted above.  Physical Exam: Vitals:   11/06/20 1057  BP: 130/60  Pulse: 67  SpO2: 99%  Weight: 134 lb 3.2 oz (60.9 kg)  Height: 5' (1.524 m)    GEN- The patient is well appearing, alert and oriented x 3 today.   HEENT: normocephalic, atraumatic; sclera clear, conjunctiva pink; hearing intact; oropharynx clear; neck supple, no JVP Lymph- no cervical lymphadenopathy Lungs- Clear to ausculation bilaterally, normal work of breathing.  No wheezes, rales, rhonchi Heart- Regular rate and rhythm, no murmurs, rubs or gallops, PMI not laterally displaced GI- soft, non-tender, non-distended, bowel sounds present, no hepatosplenomegaly Extremities- no clubbing, cyanosis, or edema; DP/PT/radial pulses 2+ bilaterally MS- no significant deformity or atrophy Skin- warm and dry, no rash or lesion Psych- euthymic mood, full affect Neuro- strength and sensation are intact  EKG is not ordered. Personal review of EKG from  09/06/20  shows stable QTc  Additional studies reviewed include: Previous EP and gen cards office notes  Assessment and Plan:  1. Persistent atrial fibrillation EKG 09/06/20 shows NSR with stable QTc. Will continue regular follow up and close monitoring to avoid toxicitiy on ikosyn 125 mcg BID She is not candidate for ablation Continue eliquis for CHA2DS2VASC of at least 8.   BMET and Mg today She had recent stable EKG and has had to pay out of pocket for the last 2 due to their proximity to one another. Will check  at her appropriate next follow up, which will be in the 6 month window from last EKG.   2. NICM Volume status stable on exam Echo TEE 05/17/2020 with LVEF 30-35%. Consider repeating in several months of maintaining NSR. Continue losartan and Toprol. Medication titration has been limited by hypotension, poor candidate for Entresto with overall soft pressures.  Could consider low dose spiro if EF remains depressed despite NSR.    3. Secondary Hypercoagulable State (ICD10:  D68.69) The patient is at significant risk for stroke/thromboembolism based upon her CHA2DS2-VASc Score of 8.  Continue Apixaban (Eliquis).  Labs today.  Shirley Friar, PA-C  11/06/20 11:21 AM

## 2020-11-06 ENCOUNTER — Encounter: Payer: Self-pay | Admitting: Student

## 2020-11-06 ENCOUNTER — Ambulatory Visit: Payer: Medicare HMO | Admitting: Student

## 2020-11-06 ENCOUNTER — Other Ambulatory Visit: Payer: Self-pay

## 2020-11-06 VITALS — BP 130/60 | HR 67 | Ht 60.0 in | Wt 134.2 lb

## 2020-11-06 DIAGNOSIS — I4819 Other persistent atrial fibrillation: Secondary | ICD-10-CM | POA: Diagnosis not present

## 2020-11-06 DIAGNOSIS — I10 Essential (primary) hypertension: Secondary | ICD-10-CM

## 2020-11-06 DIAGNOSIS — I428 Other cardiomyopathies: Secondary | ICD-10-CM | POA: Diagnosis not present

## 2020-11-06 DIAGNOSIS — D6869 Other thrombophilia: Secondary | ICD-10-CM | POA: Diagnosis not present

## 2020-11-06 DIAGNOSIS — J449 Chronic obstructive pulmonary disease, unspecified: Secondary | ICD-10-CM | POA: Diagnosis not present

## 2020-11-06 LAB — BASIC METABOLIC PANEL
BUN/Creatinine Ratio: 22 (ref 12–28)
BUN: 22 mg/dL (ref 8–27)
CO2: 27 mmol/L (ref 20–29)
Calcium: 9.8 mg/dL (ref 8.7–10.3)
Chloride: 99 mmol/L (ref 96–106)
Creatinine, Ser: 1.01 mg/dL — ABNORMAL HIGH (ref 0.57–1.00)
Glucose: 72 mg/dL (ref 65–99)
Potassium: 4.3 mmol/L (ref 3.5–5.2)
Sodium: 138 mmol/L (ref 134–144)
eGFR: 55 mL/min/{1.73_m2} — ABNORMAL LOW (ref >59)

## 2020-11-06 LAB — MAGNESIUM: Magnesium: 2 mg/dL (ref 1.6–2.3)

## 2020-11-06 NOTE — Patient Instructions (Signed)
Medication Instructions:  Your physician recommends that you continue on your current medications as directed. Please refer to the Current Medication list given to you today.  *If you need a refill on your cardiac medications before your next appointment, please call your pharmacy*   Lab Work: TODAY: BMET, Mg  If you have labs (blood work) drawn today and your tests are completely normal, you will receive your results only by: Manuel Garcia (if you have MyChart) OR A paper copy in the mail If you have any lab test that is abnormal or we need to change your treatment, we will call you to review the results.   Follow-Up: At [facility], you and your health needs are our priority.  As part of our continuing mission to provide you with exceptional heart care, we have created designated Provider Care Teams.  These Care Teams include your primary Cardiologist (physician) and Advanced Practice Providers (APPs -  Physician Assistants and Nurse Practitioners) who all work together to provide you with the care you need, when you need it.  We recommend signing up for the patient portal called "MyChart".  Sign up information is provided on this After Visit Summary.  MyChart is used to connect with patients for Virtual Visits (Telemedicine).  Patients are able to view lab/test results, encounter notes, upcoming appointments, etc.  Non-urgent messages can be sent to your provider as well.   To learn more about what you can do with MyChart, go to [email or web address].    Your next appointment:   4 month(s)  The format for your next appointment:   In Person  Provider:   You may see Thompson Grayer, MD or one of the following Advanced Practice Providers on your designated Care Team:   Tommye Standard, [place] Legrand Como "[facility]" [place], [place]

## 2020-11-08 DIAGNOSIS — K219 Gastro-esophageal reflux disease without esophagitis: Secondary | ICD-10-CM | POA: Diagnosis not present

## 2020-11-08 DIAGNOSIS — E1165 Type 2 diabetes mellitus with hyperglycemia: Secondary | ICD-10-CM | POA: Diagnosis not present

## 2020-11-19 DIAGNOSIS — J449 Chronic obstructive pulmonary disease, unspecified: Secondary | ICD-10-CM | POA: Diagnosis not present

## 2020-11-21 DIAGNOSIS — E1165 Type 2 diabetes mellitus with hyperglycemia: Secondary | ICD-10-CM | POA: Diagnosis not present

## 2020-11-21 DIAGNOSIS — E559 Vitamin D deficiency, unspecified: Secondary | ICD-10-CM | POA: Diagnosis not present

## 2020-11-21 DIAGNOSIS — D509 Iron deficiency anemia, unspecified: Secondary | ICD-10-CM | POA: Diagnosis not present

## 2020-11-21 DIAGNOSIS — E782 Mixed hyperlipidemia: Secondary | ICD-10-CM | POA: Diagnosis not present

## 2020-11-27 DIAGNOSIS — Z0001 Encounter for general adult medical examination with abnormal findings: Secondary | ICD-10-CM | POA: Diagnosis not present

## 2020-11-27 DIAGNOSIS — M818 Other osteoporosis without current pathological fracture: Secondary | ICD-10-CM | POA: Diagnosis not present

## 2020-11-27 DIAGNOSIS — I482 Chronic atrial fibrillation, unspecified: Secondary | ICD-10-CM | POA: Diagnosis not present

## 2020-11-27 DIAGNOSIS — K219 Gastro-esophageal reflux disease without esophagitis: Secondary | ICD-10-CM | POA: Diagnosis not present

## 2020-11-27 DIAGNOSIS — I10 Essential (primary) hypertension: Secondary | ICD-10-CM | POA: Diagnosis not present

## 2020-11-27 DIAGNOSIS — D649 Anemia, unspecified: Secondary | ICD-10-CM | POA: Diagnosis not present

## 2020-11-27 DIAGNOSIS — R69 Illness, unspecified: Secondary | ICD-10-CM | POA: Diagnosis not present

## 2020-11-27 DIAGNOSIS — J449 Chronic obstructive pulmonary disease, unspecified: Secondary | ICD-10-CM | POA: Diagnosis not present

## 2020-11-27 DIAGNOSIS — E782 Mixed hyperlipidemia: Secondary | ICD-10-CM | POA: Diagnosis not present

## 2020-11-27 DIAGNOSIS — Z23 Encounter for immunization: Secondary | ICD-10-CM | POA: Diagnosis not present

## 2020-11-29 DIAGNOSIS — M79672 Pain in left foot: Secondary | ICD-10-CM | POA: Diagnosis not present

## 2020-11-29 DIAGNOSIS — I739 Peripheral vascular disease, unspecified: Secondary | ICD-10-CM | POA: Diagnosis not present

## 2020-11-29 DIAGNOSIS — M79671 Pain in right foot: Secondary | ICD-10-CM | POA: Diagnosis not present

## 2020-11-29 DIAGNOSIS — L110 Acquired keratosis follicularis: Secondary | ICD-10-CM | POA: Diagnosis not present

## 2020-12-04 DIAGNOSIS — F419 Anxiety disorder, unspecified: Secondary | ICD-10-CM | POA: Diagnosis not present

## 2020-12-04 DIAGNOSIS — R69 Illness, unspecified: Secondary | ICD-10-CM | POA: Diagnosis not present

## 2020-12-04 DIAGNOSIS — E785 Hyperlipidemia, unspecified: Secondary | ICD-10-CM | POA: Diagnosis not present

## 2020-12-04 DIAGNOSIS — F3341 Major depressive disorder, recurrent, in partial remission: Secondary | ICD-10-CM | POA: Diagnosis not present

## 2020-12-04 DIAGNOSIS — J9611 Chronic respiratory failure with hypoxia: Secondary | ICD-10-CM | POA: Diagnosis not present

## 2020-12-04 DIAGNOSIS — G629 Polyneuropathy, unspecified: Secondary | ICD-10-CM | POA: Diagnosis not present

## 2020-12-04 DIAGNOSIS — D6869 Other thrombophilia: Secondary | ICD-10-CM | POA: Diagnosis not present

## 2020-12-04 DIAGNOSIS — I4891 Unspecified atrial fibrillation: Secondary | ICD-10-CM | POA: Diagnosis not present

## 2020-12-04 DIAGNOSIS — I509 Heart failure, unspecified: Secondary | ICD-10-CM | POA: Diagnosis not present

## 2020-12-04 DIAGNOSIS — J449 Chronic obstructive pulmonary disease, unspecified: Secondary | ICD-10-CM | POA: Diagnosis not present

## 2020-12-04 DIAGNOSIS — E261 Secondary hyperaldosteronism: Secondary | ICD-10-CM | POA: Diagnosis not present

## 2020-12-04 DIAGNOSIS — I739 Peripheral vascular disease, unspecified: Secondary | ICD-10-CM | POA: Diagnosis not present

## 2020-12-04 DIAGNOSIS — I110 Hypertensive heart disease with heart failure: Secondary | ICD-10-CM | POA: Diagnosis not present

## 2020-12-05 DIAGNOSIS — J449 Chronic obstructive pulmonary disease, unspecified: Secondary | ICD-10-CM | POA: Diagnosis not present

## 2020-12-07 DIAGNOSIS — J449 Chronic obstructive pulmonary disease, unspecified: Secondary | ICD-10-CM | POA: Diagnosis not present

## 2020-12-08 DIAGNOSIS — E1165 Type 2 diabetes mellitus with hyperglycemia: Secondary | ICD-10-CM | POA: Diagnosis not present

## 2020-12-08 DIAGNOSIS — K219 Gastro-esophageal reflux disease without esophagitis: Secondary | ICD-10-CM | POA: Diagnosis not present

## 2020-12-19 DIAGNOSIS — J449 Chronic obstructive pulmonary disease, unspecified: Secondary | ICD-10-CM | POA: Diagnosis not present

## 2021-01-04 DIAGNOSIS — J449 Chronic obstructive pulmonary disease, unspecified: Secondary | ICD-10-CM | POA: Diagnosis not present

## 2021-01-06 DIAGNOSIS — J449 Chronic obstructive pulmonary disease, unspecified: Secondary | ICD-10-CM | POA: Diagnosis not present

## 2021-01-08 ENCOUNTER — Ambulatory Visit: Payer: Medicare HMO | Admitting: Cardiology

## 2021-01-08 DIAGNOSIS — K219 Gastro-esophageal reflux disease without esophagitis: Secondary | ICD-10-CM | POA: Diagnosis not present

## 2021-01-08 DIAGNOSIS — E1165 Type 2 diabetes mellitus with hyperglycemia: Secondary | ICD-10-CM | POA: Diagnosis not present

## 2021-01-10 ENCOUNTER — Ambulatory Visit (INDEPENDENT_AMBULATORY_CARE_PROVIDER_SITE_OTHER): Payer: Medicare HMO | Admitting: Cardiology

## 2021-01-10 ENCOUNTER — Encounter: Payer: Self-pay | Admitting: Cardiology

## 2021-01-10 VITALS — BP 156/70 | HR 72 | Ht 60.0 in | Wt 136.4 lb

## 2021-01-10 DIAGNOSIS — I4891 Unspecified atrial fibrillation: Secondary | ICD-10-CM

## 2021-01-10 DIAGNOSIS — I5022 Chronic systolic (congestive) heart failure: Secondary | ICD-10-CM

## 2021-01-10 DIAGNOSIS — I10 Essential (primary) hypertension: Secondary | ICD-10-CM

## 2021-01-10 MED ORDER — FUROSEMIDE 20 MG PO TABS: 20.0000 mg | ORAL_TABLET | ORAL | Status: DC | PRN

## 2021-01-10 NOTE — Progress Notes (Signed)
Clinical Summary Amanda Palmer is a 87 y.o.female seen today for follow up of the following medical problems.    1.Peristent afib - she is on dofetilide, followed by EP - from notes not an ablation candidate     - no recent palpitations   2. Chronic systolic HF - 62/6948 TTE: LVEF 60-65% - 05/2020 TEE: LVEF 30-35%. This was around the time she had issues with afib with RVR - medical therapy limited by low bp's      09/2020 echo LVEF 60-65% - chronic SOB she attributes to her COPD. Some cough, no wheezing. No recent edema     3. HTN - compliant with meds  10/2020 afib clinic 130/60     4. Hyperlpidemia - on statin   5. SOB/Chronic lung disease - on home O2, followed by pulmonary     6. Vocal cord dysfunction     6. Valvular heart disease - 05/2020 TEE mod to severe, mod MR, LVEF 30-35% 09/2020 trivial MR, normalized EF  - apepars to have been funciton MR that has resolved.      Sister of Amanda Palmer, former patient of mine who passed way in 2020   Past Medical History:  Diagnosis Date   A-fib [facility])    Anxiety    Cancer ([place])    breast   COPD (chronic obstructive pulmonary disease) ([place])    Depression    Hypertension    TIA (transient ischemic attack)    remote     Allergies  Allergen Reactions   Morphine And Related Nausea And Vomiting   Penicillins Rash    Reaction: unknown   Sulfa Antibiotics Nausea And Vomiting     Current Outpatient Medications  Medication Sig Dispense Refill   acetic acid-hydrocortisone (VOSOL-HC) OTIC solution as needed.     albuterol (PROVENTIL HFA;VENTOLIN HFA) 108 (90 Base) MCG/ACT inhaler Inhale 2 puffs into the lungs every 6 (six) hours as needed for wheezing or shortness of breath.     albuterol (PROVENTIL) (2.5 MG/3ML) 0.083% nebulizer solution Take 3 mLs (2.5 mg total) by nebulization every 6 (six) hours as needed for wheezing or shortness of breath. 360 mL 5   atorvastatin (LIPITOR) 20 MG tablet Take 20 mg by  mouth at bedtime.     clonazePAM (KLONOPIN) 1 MG tablet Take 1 tablet (1 mg total) by mouth at bedtime. 30 tablet 0   diltiazem (CARDIZEM CD) 120 MG 24 hr capsule Take 1 capsule (120 mg total) by mouth daily. (long acting) 30 capsule 6   diltiazem (CARDIZEM) 30 MG tablet Take 1 tablet (30 mg total) by mouth every 8 (eight) hours as needed (for heartrate greater than 100). 30 tablet 2   dofetilide (TIKOSYN) 125 MCG capsule Take 1 capsule (125 mcg total) by mouth 2 (two) times daily. 60 capsule 0   ELIQUIS 5 MG TABS tablet Take 1 tablet by mouth twice daily 60 tablet 6   esomeprazole (NEXIUM) 20 MG capsule Take 20 mg by mouth daily.     famotidine (PEPCID) 20 MG tablet Take 20 mg by mouth at bedtime.     furosemide (LASIX) 20 MG tablet Take 20 mg by mouth daily.     ibandronate (BONIVA) 150 MG tablet Take 150 mg by mouth every 30 (thirty) days.     losartan (COZAAR) 50 MG tablet Take 50 mg by mouth daily.     Magnesium Oxide 250 MG TABS Take 1 tablet (250 mg total) by mouth daily. [place]  tablet 5   metoprolol succinate (TOPROL XL) 50 MG 24 hr tablet Take 1 tablet (50 mg total) by mouth daily. Take with or immediately following a meal. 30 tablet 3   Multiple Vitamin (MULTIVITAMIN WITH MINERALS) TABS tablet Take 1 tablet by mouth daily.     OXYGEN Inhale 3 L into the lungs at bedtime.     potassium chloride (KLOR-CON) 10 MEQ tablet Take 2 tablets (20 mEq total) by mouth daily. 90 tablet 3   Probiotic Product (PROBIOTIC DAILY) CAPS Take 1 capsule by mouth daily.     Respiratory Therapy Supplies (FLUTTER) DEVI 1 Device by Does not apply route in the morning, at noon, in the evening, and at bedtime. 1 each 0   SILTUSSIN SA 100 MG/5ML syrup Take 5 mLs by mouth every 4 (four) hours as needed.     TRELEGY ELLIPTA 100-62.5-25 MCG/INH AEPB INHALE ONE PUFF into THE lungs DAILY 60 each 5   triamcinolone cream (KENALOG) 0.1 % Apply 1 application topically daily as needed (hemorrhoids).     venlafaxine (EFFEXOR) 75  MG tablet Take 75-150 mg by mouth See admin instructions. Take 150 mg in the morning and 75 mg in the evening     No current facility-administered medications for this visit.     Past Surgical History:  Procedure Laterality Date   ABDOMINAL HYSTERECTOMY     APPENDECTOMY     BACK SURGERY     total of five surgeries   BREAST BIOPSY Left    benign   BREAST CAPSULECTOMY WITH IMPLANT EXCHANGE Right 05/27/2016   Procedure: REMOVAL AND REPLACEMENT OF RIGHT BREAST IMPLANT AND BREAST CAPSULECTOMY;  Surgeon: Cristine Polio, MD;  Location: [place];  Service: Plastics;  Laterality: Right;   CARDIAC CATHETERIZATION     CARDIOVERSION N/A 05/17/2020   Procedure: CARDIOVERSION;  Surgeon: Acie Fredrickson Wonda Cheng, MD;  Location: [facility] ENDOSCOPY;  Service: Cardiovascular;  Laterality: N/A;   CHOLECYSTECTOMY     COLONOSCOPY  08/2014   Dr. Britta Mccreedy: Small sessile polyp at the cecum, removed, tubular adenoma   ESOPHAGOGASTRODUODENOSCOPY  06/2013   Dr. Britta Mccreedy: Hiatal hernia, Schatzki ring, nonobstructive.   MASTECTOMY     right    ORIF ANKLE FRACTURE Left 06/01/2014   Procedure: OPEN REDUCTION INTERNAL FIXATION (ORIF) LEFT ANKLE FRACTURE;  Surgeon: Marybelle Killings, MD;  Location: [place];  Service: Orthopedics;  Laterality: Left;   ROTATOR CUFF REPAIR     left   TEE WITHOUT CARDIOVERSION N/A 05/17/2020   Procedure: TRANSESOPHAGEAL ECHOCARDIOGRAM (TEE);  Surgeon: Acie Fredrickson Wonda Cheng, MD;  Location: [facility] ENDOSCOPY;  Service: Cardiovascular;  Laterality: N/A;   TOTAL HIP ARTHROPLASTY     right   TOTAL KNEE ARTHROPLASTY     bilateral     Allergies  Allergen Reactions   Morphine And Related Nausea And Vomiting   Penicillins Rash    Reaction: unknown   Sulfa Antibiotics Nausea And Vomiting      Family History  Problem Relation Age of Onset   Heart disease Brother    Heart disease Sister    Breast cancer Sister    Leukemia Brother    Breast cancer Sister    Breast cancer Other        one deceased,  one living   Colon cancer Neg Hx      Social History Amanda Palmer reports that she has never smoked. She has never used smokeless tobacco. Amanda Palmer reports no history of alcohol use.   Review of  Systems CONSTITUTIONAL: No weight loss, fever, chills, weakness or fatigue.  HEENT: Eyes: No visual loss, blurred vision, double vision or yellow sclerae.No hearing loss, sneezing, congestion, runny nose or sore throat.  SKIN: No rash or itching.  CARDIOVASCULAR: per hpi RESPIRATORY: No shortness of breath, cough or sputum.  GASTROINTESTINAL: No anorexia, nausea, vomiting or diarrhea. No abdominal pain or blood.  GENITOURINARY: No burning on urination, no polyuria NEUROLOGICAL: No headache, dizziness, syncope, paralysis, ataxia, numbness or tingling in the extremities. No change in bowel or bladder control.  MUSCULOSKELETAL: No muscle, back pain, joint pain or stiffness.  LYMPHATICS: No enlarged nodes. No history of splenectomy.  PSYCHIATRIC: No history of depression or anxiety.  ENDOCRINOLOGIC: No reports of sweating, cold or heat intolerance. No polyuria or polydipsia.  Marland Kitchen   Physical Examination Today's Vitals   01/10/21 1300  BP: (!) 156/70  Pulse: 72  SpO2: 98%  Weight: 136 lb 6.4 oz (61.9 kg)  Height: 5' (1.524 m)   Body mass index is 26.64 kg/m.  Gen: resting comfortably, no acute distress HEENT: no scleral icterus, pupils equal round and reactive, no palptable cervical adenopathy,  CV: RRR, no m/r/ gno jvd Resp: Clear to auscultation bilaterally GI: abdomen is soft, non-tender, non-distended, normal bowel sounds, no hepatosplenomegaly MSK: extremities are warm, no edema.  Skin: warm, no rash Neuro:  no focal deficits Psych: appropriate affect   Diagnostic Studies     Assessment and Plan  Afib - has done well on dofetilide - no recent symptoms, continue current meds including anticoagulation   2. Chronic systolic HF -prior drop in LVEF likely tachymediated as it  was in the setting of uncontrolled afib - LVEF has normalized by most recent echo after control of her afib - continue current meds   3. HTN - elevated today, had been at goal at afib clinic. Prior issues with low bp's. At 30 and with prior issues with low bp's would have conservative target. Monitor at this time, no med changes      Arnoldo Lenis, M.D.

## 2021-01-10 NOTE — Patient Instructions (Signed)
Medication Instructions:  Change your Lasix to 20mg  as needed for swelling.  Continue all other medications.     Labwork: none  Testing/Procedures: none  Follow-Up: Your physician wants you to follow up in: 6 months.  You will receive a reminder letter in the mail one-two months in advance.  If you don't receive a letter, please call our office to schedule the follow up appointment   Any Other Special Instructions Will Be Listed Below (If Applicable).   If you need a refill on your cardiac medications before your next appointment, please call your pharmacy.

## 2021-01-11 ENCOUNTER — Other Ambulatory Visit: Payer: Self-pay | Admitting: *Deleted

## 2021-01-11 MED ORDER — ALBUTEROL SULFATE (2.5 MG/3ML) 0.083% IN NEBU: 2.5000 mg | INHALATION_SOLUTION | Freq: Four times a day (QID) | RESPIRATORY_TRACT | 5 refills | Status: DC | PRN

## 2021-01-19 DIAGNOSIS — J449 Chronic obstructive pulmonary disease, unspecified: Secondary | ICD-10-CM | POA: Diagnosis not present

## 2021-01-20 DIAGNOSIS — W1839XA Other fall on same level, initial encounter: Secondary | ICD-10-CM | POA: Diagnosis not present

## 2021-01-20 DIAGNOSIS — S3991XA Unspecified injury of abdomen, initial encounter: Secondary | ICD-10-CM | POA: Diagnosis not present

## 2021-01-20 DIAGNOSIS — M549 Dorsalgia, unspecified: Secondary | ICD-10-CM | POA: Diagnosis not present

## 2021-01-20 DIAGNOSIS — Q396 Congenital diverticulum of esophagus: Secondary | ICD-10-CM | POA: Diagnosis not present

## 2021-01-20 DIAGNOSIS — Z885 Allergy status to narcotic agent status: Secondary | ICD-10-CM | POA: Diagnosis not present

## 2021-01-20 DIAGNOSIS — Z880 Allergy status to penicillin: Secondary | ICD-10-CM | POA: Diagnosis not present

## 2021-01-20 DIAGNOSIS — S32029A Unspecified fracture of second lumbar vertebra, initial encounter for closed fracture: Secondary | ICD-10-CM | POA: Diagnosis not present

## 2021-01-20 DIAGNOSIS — R0781 Pleurodynia: Secondary | ICD-10-CM | POA: Diagnosis not present

## 2021-01-20 DIAGNOSIS — J9811 Atelectasis: Secondary | ICD-10-CM | POA: Diagnosis not present

## 2021-01-20 DIAGNOSIS — R52 Pain, unspecified: Secondary | ICD-10-CM | POA: Diagnosis not present

## 2021-01-20 DIAGNOSIS — S2232XA Fracture of one rib, left side, initial encounter for closed fracture: Secondary | ICD-10-CM | POA: Diagnosis not present

## 2021-01-20 DIAGNOSIS — S2242XA Multiple fractures of ribs, left side, initial encounter for closed fracture: Secondary | ICD-10-CM | POA: Diagnosis not present

## 2021-01-20 DIAGNOSIS — K449 Diaphragmatic hernia without obstruction or gangrene: Secondary | ICD-10-CM | POA: Diagnosis not present

## 2021-01-20 DIAGNOSIS — R10812 Left upper quadrant abdominal tenderness: Secondary | ICD-10-CM | POA: Diagnosis not present

## 2021-01-20 DIAGNOSIS — W19XXXA Unspecified fall, initial encounter: Secondary | ICD-10-CM | POA: Diagnosis not present

## 2021-01-20 DIAGNOSIS — S22079A Unspecified fracture of T9-T10 vertebra, initial encounter for closed fracture: Secondary | ICD-10-CM | POA: Diagnosis not present

## 2021-01-21 ENCOUNTER — Other Ambulatory Visit: Payer: Self-pay | Admitting: Physician Assistant

## 2021-01-22 ENCOUNTER — Telehealth: Payer: Self-pay | Admitting: Orthopedic Surgery

## 2021-01-22 NOTE — Telephone Encounter (Signed)
Patient inquiry following visit at [facility] emergency for fractures from a fall, back and also rib, which aware we don't treat - asking for appointment today - which I relayed unfortunately we do not have available. Discussed options, and the need for Xray CD, reports, [facility] ED and provider notes; will decide.

## 2021-01-24 DIAGNOSIS — R296 Repeated falls: Secondary | ICD-10-CM | POA: Diagnosis not present

## 2021-01-24 DIAGNOSIS — Z758 Other problems related to medical facilities and other health care: Secondary | ICD-10-CM | POA: Diagnosis not present

## 2021-01-24 DIAGNOSIS — S2232XA Fracture of one rib, left side, initial encounter for closed fracture: Secondary | ICD-10-CM | POA: Diagnosis not present

## 2021-02-04 DIAGNOSIS — J449 Chronic obstructive pulmonary disease, unspecified: Secondary | ICD-10-CM | POA: Diagnosis not present

## 2021-02-06 DIAGNOSIS — J449 Chronic obstructive pulmonary disease, unspecified: Secondary | ICD-10-CM | POA: Diagnosis not present

## 2021-02-07 DIAGNOSIS — E1165 Type 2 diabetes mellitus with hyperglycemia: Secondary | ICD-10-CM | POA: Diagnosis not present

## 2021-02-07 DIAGNOSIS — K219 Gastro-esophageal reflux disease without esophagitis: Secondary | ICD-10-CM | POA: Diagnosis not present

## 2021-02-13 DIAGNOSIS — L110 Acquired keratosis follicularis: Secondary | ICD-10-CM | POA: Diagnosis not present

## 2021-02-13 DIAGNOSIS — M79671 Pain in right foot: Secondary | ICD-10-CM | POA: Diagnosis not present

## 2021-02-13 DIAGNOSIS — M79672 Pain in left foot: Secondary | ICD-10-CM | POA: Diagnosis not present

## 2021-02-13 DIAGNOSIS — I739 Peripheral vascular disease, unspecified: Secondary | ICD-10-CM | POA: Diagnosis not present

## 2021-02-18 DIAGNOSIS — J449 Chronic obstructive pulmonary disease, unspecified: Secondary | ICD-10-CM | POA: Diagnosis not present

## 2021-02-21 ENCOUNTER — Ambulatory Visit (HOSPITAL_COMMUNITY)
Admission: RE | Admit: 2021-02-21 | Discharge: 2021-02-21 | Disposition: A | Discharge status: Home / Self Care | Payer: Medicare HMO | Source: Ambulatory Visit | Attending: Nurse Practitioner | Admitting: Nurse Practitioner

## 2021-02-21 ENCOUNTER — Other Ambulatory Visit (HOSPITAL_COMMUNITY): Payer: Self-pay | Admitting: Nurse Practitioner

## 2021-02-21 ENCOUNTER — Other Ambulatory Visit: Payer: Self-pay

## 2021-02-21 DIAGNOSIS — M545 Low back pain, unspecified: Secondary | ICD-10-CM | POA: Insufficient documentation

## 2021-02-21 DIAGNOSIS — M4326 Fusion of spine, lumbar region: Secondary | ICD-10-CM | POA: Diagnosis not present

## 2021-02-21 DIAGNOSIS — M47816 Spondylosis without myelopathy or radiculopathy, lumbar region: Secondary | ICD-10-CM | POA: Diagnosis not present

## 2021-02-24 ENCOUNTER — Other Ambulatory Visit: Payer: Self-pay | Admitting: Internal Medicine

## 2021-03-06 DIAGNOSIS — J449 Chronic obstructive pulmonary disease, unspecified: Secondary | ICD-10-CM | POA: Diagnosis not present

## 2021-03-08 DIAGNOSIS — J449 Chronic obstructive pulmonary disease, unspecified: Secondary | ICD-10-CM | POA: Diagnosis not present

## 2021-03-09 ENCOUNTER — Ambulatory Visit: Payer: Medicare HMO | Admitting: Internal Medicine

## 2021-03-09 ENCOUNTER — Other Ambulatory Visit: Payer: Self-pay

## 2021-03-09 VITALS — BP 152/68 | HR 90 | Ht 61.0 in | Wt 134.6 lb

## 2021-03-09 DIAGNOSIS — E1165 Type 2 diabetes mellitus with hyperglycemia: Secondary | ICD-10-CM | POA: Diagnosis not present

## 2021-03-09 DIAGNOSIS — I428 Other cardiomyopathies: Secondary | ICD-10-CM

## 2021-03-09 DIAGNOSIS — K219 Gastro-esophageal reflux disease without esophagitis: Secondary | ICD-10-CM | POA: Diagnosis not present

## 2021-03-09 DIAGNOSIS — J984 Other disorders of lung: Secondary | ICD-10-CM

## 2021-03-09 DIAGNOSIS — I4819 Other persistent atrial fibrillation: Secondary | ICD-10-CM

## 2021-03-09 NOTE — Progress Notes (Signed)
PCP: Celene Squibb, MD   Primary EP: Dr Rayann Heman  Amanda Palmer is a 87 y.o. female who presents today for routine electrophysiology followup.  Since last being seen in our clinic, the patient reports doing very well.  Today, she denies symptoms of palpitations, chest pain, shortness of breath,  lower extremity edema, dizziness, presyncope, or syncope.  The patient is otherwise without complaint today.   Past Medical History:  Diagnosis Date   A-fib ([place])    Anxiety    Cancer ([place])    breast   COPD (chronic obstructive pulmonary disease) ([facility])    Depression    Hypertension    TIA (transient ischemic attack)    remote   Past Surgical History:  Procedure Laterality Date   ABDOMINAL HYSTERECTOMY     APPENDECTOMY     BACK SURGERY     total of five surgeries   BREAST BIOPSY Left    benign   BREAST CAPSULECTOMY WITH IMPLANT EXCHANGE Right 05/27/2016   Procedure: REMOVAL AND REPLACEMENT OF RIGHT BREAST IMPLANT AND BREAST CAPSULECTOMY;  Surgeon: Cristine Polio, MD;  Location: [place];  Service: Plastics;  Laterality: Right;   CARDIAC CATHETERIZATION     CARDIOVERSION N/A 05/17/2020   Procedure: CARDIOVERSION;  Surgeon: Acie Fredrickson Wonda Cheng, MD;  Location: [facility] ENDOSCOPY;  Service: Cardiovascular;  Laterality: N/A;   CHOLECYSTECTOMY     COLONOSCOPY  08/2014   Dr. Britta Mccreedy: Small sessile polyp at the cecum, removed, tubular adenoma   ESOPHAGOGASTRODUODENOSCOPY  06/2013   Dr. Britta Mccreedy: Hiatal hernia, Schatzki ring, nonobstructive.   MASTECTOMY     right    ORIF ANKLE FRACTURE Left 06/01/2014   Procedure: OPEN REDUCTION INTERNAL FIXATION (ORIF) LEFT ANKLE FRACTURE;  Surgeon: Marybelle Killings, MD;  Location: [place];  Service: Orthopedics;  Laterality: Left;   ROTATOR CUFF REPAIR     left   TEE WITHOUT CARDIOVERSION N/A 05/17/2020   Procedure: TRANSESOPHAGEAL ECHOCARDIOGRAM (TEE);  Surgeon: Acie Fredrickson Wonda Cheng, MD;  Location: [facility] ENDOSCOPY;  Service: Cardiovascular;  Laterality: N/A;    TOTAL HIP ARTHROPLASTY     right   TOTAL KNEE ARTHROPLASTY     bilateral    ROS- all systems are reviewed and negatives except as per HPI above  Current Outpatient Medications  Medication Sig Dispense Refill   acetic acid-hydrocortisone (VOSOL-HC) OTIC solution as needed.     albuterol (PROVENTIL HFA;VENTOLIN HFA) 108 (90 Base) MCG/ACT inhaler Inhale 2 puffs into the lungs every 6 (six) hours as needed for wheezing or shortness of breath.     albuterol (PROVENTIL) (2.5 MG/3ML) 0.083% nebulizer solution Take 3 mLs (2.5 mg total) by nebulization every 6 (six) hours as needed for wheezing or shortness of breath. 360 mL 5   atorvastatin (LIPITOR) 20 MG tablet Take 20 mg by mouth at bedtime.     clonazePAM (KLONOPIN) 1 MG tablet Take 1 tablet (1 mg total) by mouth at bedtime. 30 tablet 0   diltiazem (CARDIZEM CD) 120 MG 24 hr capsule Take 1 capsule (120 mg total) by mouth daily. (long acting) 30 capsule 6   diltiazem (CARDIZEM) 30 MG tablet Take 1 tablet (30 mg total) by mouth every 8 (eight) hours as needed (for heartrate greater than 100). 30 tablet 2   dofetilide (TIKOSYN) 125 MCG capsule Take 1 capsule by mouth twice daily 180 capsule 2   ELIQUIS 5 MG TABS tablet Take 1 tablet by mouth twice daily 60 tablet 6   esomeprazole (NEXIUM) 20 MG  capsule Take 20 mg by mouth daily.     famotidine (PEPCID) 20 MG tablet Take 20 mg by mouth at bedtime.     furosemide (LASIX) 20 MG tablet Take 1 tablet (20 mg total) by mouth as needed for edema (swelling).     ibandronate (BONIVA) 150 MG tablet Take 150 mg by mouth every 30 (thirty) days.     losartan (COZAAR) 50 MG tablet Take 50 mg by mouth daily.     Magnesium Oxide 250 MG TABS Take 1 tablet (250 mg total) by mouth daily. 30 tablet 5   metoprolol succinate (TOPROL XL) 50 MG 24 hr tablet Take 1 tablet (50 mg total) by mouth daily. Take with or immediately following a meal. 30 tablet 3   Multiple Vitamin (MULTIVITAMIN WITH MINERALS) TABS tablet Take 1  tablet by mouth daily.     OXYGEN Inhale 3 L into the lungs at bedtime.     potassium chloride (KLOR-CON) 10 MEQ tablet Take 2 tablets (20 mEq total) by mouth daily. 90 tablet 3   Probiotic Product (PROBIOTIC DAILY) CAPS Take 1 capsule by mouth daily.     Respiratory Therapy Supplies (FLUTTER) DEVI 1 Device by Does not apply route in the morning, at noon, in the evening, and at bedtime. 1 each 0   SILTUSSIN SA 100 MG/5ML syrup Take 5 mLs by mouth every 4 (four) hours as needed.     TRELEGY ELLIPTA 100-62.5-25 MCG/INH AEPB INHALE ONE PUFF into THE lungs DAILY 60 each 5   triamcinolone cream (KENALOG) 0.1 % Apply 1 application topically daily as needed (hemorrhoids).     venlafaxine (EFFEXOR) 75 MG tablet Take 75-150 mg by mouth See admin instructions. Take 150 mg in the morning and 75 mg in the evening     No current facility-administered medications for this visit.    Physical Exam: Vitals:   03/09/21 1147  BP: (!) 152/68  Pulse: 90  SpO2: 96%  Weight: 134 lb 9.6 oz (61.1 kg)  Height: 5\' 1"  (1.549 m)    GEN- The patient is well appearing, alert and oriented x 3 today.   Head- normocephalic, atraumatic Eyes-  Sclera clear, conjunctiva pink Ears- hearing intact Oropharynx- clear Lungs- Clear to ausculation bilaterally, normal work of breathing Heart- Regular rate and rhythm, no murmurs, rubs or gallops, PMI not laterally displaced GI- soft, NT, ND, + BS Extremities- no clubbing, cyanosis, or edema  Wt Readings from Last 3 Encounters:  03/09/21 134 lb 9.6 oz (61.1 kg)  01/10/21 136 lb 6.4 oz (61.9 kg)  11/06/20 134 lb 3.2 oz (60.9 kg)    EKG tracing ordered today is personally reviewed and shows sinus rhythm, left axis deviation, qtc 467 msec  Assessment and Plan:  Persistent afib Doing well with tikosyn Labs 11/22 reviewed  2. Nonischemic CM Likely tachycardia mediated EF has resolved  3. HTN Stable No change required today   Risks, benefits and potential  toxicities for medications prescribed and/or refilled reviewed with patient today.   Return to see EP PA in 4 months  Thompson Grayer MD, [facility] 03/09/2021 11:55 AM

## 2021-03-09 NOTE — Patient Instructions (Addendum)
Medication Instructions:  Your physician recommends that you continue on your current medications as directed. Please refer to the Current Medication list given to you today. *If you need a refill on your cardiac medications before your next appointment, please call your pharmacy*  Lab Work: None. If you have labs (blood work) drawn today and your tests are completely normal, you will receive your results only by: [employer] (if you have MyChart) OR A paper copy in the mail If you have any lab test that is abnormal or we need to change your treatment, we will call you to review the results.  Testing/Procedures: None.  Follow-Up: At [facility], you and your health needs are our priority.  As part of our continuing mission to provide you with exceptional heart care, we have created designated Provider Care Teams.  These Care Teams include your primary Cardiologist (physician) and Advanced Practice Providers (APPs -  Physician Assistants and Nurse Practitioners) who all work together to provide you with the care you need, when you need it.  Your physician wants you to follow-up in: 07/06/21 at 12 pm  Legrand Como "Jonni Sanger" [place], [place]  We recommend signing up for the patient portal called "MyChart".  Sign up information is provided on this After Visit Summary.  MyChart is used to connect with patients for Virtual Visits (Telemedicine).  Patients are able to view lab/test results, encounter notes, upcoming appointments, etc.  Non-urgent messages can be sent to your provider as well.   To learn more about what you can do with MyChart, go to [email or web address].    Any Other Special Instructions Will Be Listed Below (If Applicable).

## 2021-03-11 DIAGNOSIS — Z8673 Personal history of transient ischemic attack (TIA), and cerebral infarction without residual deficits: Secondary | ICD-10-CM | POA: Insufficient documentation

## 2021-03-21 DIAGNOSIS — J449 Chronic obstructive pulmonary disease, unspecified: Secondary | ICD-10-CM | POA: Diagnosis not present

## 2021-04-06 DIAGNOSIS — J449 Chronic obstructive pulmonary disease, unspecified: Secondary | ICD-10-CM | POA: Diagnosis not present

## 2021-04-08 DIAGNOSIS — J449 Chronic obstructive pulmonary disease, unspecified: Secondary | ICD-10-CM | POA: Diagnosis not present

## 2021-04-10 DIAGNOSIS — K219 Gastro-esophageal reflux disease without esophagitis: Secondary | ICD-10-CM | POA: Diagnosis not present

## 2021-04-10 DIAGNOSIS — E1165 Type 2 diabetes mellitus with hyperglycemia: Secondary | ICD-10-CM | POA: Diagnosis not present

## 2021-04-21 DIAGNOSIS — J449 Chronic obstructive pulmonary disease, unspecified: Secondary | ICD-10-CM | POA: Diagnosis not present

## 2021-05-01 DIAGNOSIS — M79672 Pain in left foot: Secondary | ICD-10-CM | POA: Diagnosis not present

## 2021-05-01 DIAGNOSIS — L110 Acquired keratosis follicularis: Secondary | ICD-10-CM | POA: Diagnosis not present

## 2021-05-01 DIAGNOSIS — M79671 Pain in right foot: Secondary | ICD-10-CM | POA: Diagnosis not present

## 2021-05-01 DIAGNOSIS — I739 Peripheral vascular disease, unspecified: Secondary | ICD-10-CM | POA: Diagnosis not present

## 2021-05-08 DIAGNOSIS — E1165 Type 2 diabetes mellitus with hyperglycemia: Secondary | ICD-10-CM | POA: Diagnosis not present

## 2021-05-08 DIAGNOSIS — J449 Chronic obstructive pulmonary disease, unspecified: Secondary | ICD-10-CM | POA: Diagnosis not present

## 2021-05-08 DIAGNOSIS — K219 Gastro-esophageal reflux disease without esophagitis: Secondary | ICD-10-CM | POA: Diagnosis not present

## 2021-05-15 DIAGNOSIS — H40013 Open angle with borderline findings, low risk, bilateral: Secondary | ICD-10-CM | POA: Diagnosis not present

## 2021-05-15 DIAGNOSIS — H5213 Myopia, bilateral: Secondary | ICD-10-CM | POA: Diagnosis not present

## 2021-05-15 DIAGNOSIS — H52203 Unspecified astigmatism, bilateral: Secondary | ICD-10-CM | POA: Diagnosis not present

## 2021-05-15 DIAGNOSIS — Z961 Presence of intraocular lens: Secondary | ICD-10-CM | POA: Diagnosis not present

## 2021-05-15 DIAGNOSIS — H524 Presbyopia: Secondary | ICD-10-CM | POA: Diagnosis not present

## 2021-05-19 DIAGNOSIS — J449 Chronic obstructive pulmonary disease, unspecified: Secondary | ICD-10-CM | POA: Diagnosis not present

## 2021-05-24 DIAGNOSIS — D649 Anemia, unspecified: Secondary | ICD-10-CM | POA: Diagnosis not present

## 2021-05-24 DIAGNOSIS — E782 Mixed hyperlipidemia: Secondary | ICD-10-CM | POA: Diagnosis not present

## 2021-05-25 ENCOUNTER — Other Ambulatory Visit: Payer: Self-pay | Admitting: Pulmonary Disease

## 2021-05-28 ENCOUNTER — Other Ambulatory Visit: Payer: Self-pay | Admitting: Cardiovascular Disease

## 2021-05-31 DIAGNOSIS — J449 Chronic obstructive pulmonary disease, unspecified: Secondary | ICD-10-CM | POA: Diagnosis not present

## 2021-05-31 DIAGNOSIS — E782 Mixed hyperlipidemia: Secondary | ICD-10-CM | POA: Diagnosis not present

## 2021-05-31 DIAGNOSIS — K219 Gastro-esophageal reflux disease without esophagitis: Secondary | ICD-10-CM | POA: Diagnosis not present

## 2021-05-31 DIAGNOSIS — I739 Peripheral vascular disease, unspecified: Secondary | ICD-10-CM | POA: Diagnosis not present

## 2021-05-31 DIAGNOSIS — R600 Localized edema: Secondary | ICD-10-CM | POA: Diagnosis not present

## 2021-05-31 DIAGNOSIS — M818 Other osteoporosis without current pathological fracture: Secondary | ICD-10-CM | POA: Diagnosis not present

## 2021-05-31 DIAGNOSIS — D649 Anemia, unspecified: Secondary | ICD-10-CM | POA: Diagnosis not present

## 2021-05-31 DIAGNOSIS — I482 Chronic atrial fibrillation, unspecified: Secondary | ICD-10-CM | POA: Diagnosis not present

## 2021-05-31 DIAGNOSIS — I10 Essential (primary) hypertension: Secondary | ICD-10-CM | POA: Diagnosis not present

## 2021-05-31 DIAGNOSIS — R69 Illness, unspecified: Secondary | ICD-10-CM | POA: Diagnosis not present

## 2021-06-04 ENCOUNTER — Ambulatory Visit (INDEPENDENT_AMBULATORY_CARE_PROVIDER_SITE_OTHER): Payer: Medicare HMO | Admitting: Pulmonary Disease

## 2021-06-04 ENCOUNTER — Other Ambulatory Visit: Payer: Self-pay

## 2021-06-04 ENCOUNTER — Encounter: Payer: Self-pay | Admitting: Pulmonary Disease

## 2021-06-04 VITALS — BP 142/82 | HR 78 | Temp 98.1°F | Ht 60.0 in | Wt 139.6 lb

## 2021-06-04 DIAGNOSIS — J439 Emphysema, unspecified: Secondary | ICD-10-CM | POA: Diagnosis not present

## 2021-06-04 DIAGNOSIS — G4736 Sleep related hypoventilation in conditions classified elsewhere: Secondary | ICD-10-CM | POA: Diagnosis not present

## 2021-06-04 DIAGNOSIS — J449 Chronic obstructive pulmonary disease, unspecified: Secondary | ICD-10-CM | POA: Diagnosis not present

## 2021-06-04 MED ORDER — LEVALBUTEROL HCL 0.63 MG/3ML IN NEBU: 0.6300 mg | INHALATION_SOLUTION | Freq: Four times a day (QID) | RESPIRATORY_TRACT | 12 refills | Status: DC | PRN

## 2021-06-04 NOTE — Progress Notes (Signed)
ne ? ?[facility] ? ?Chief Complaint  ?Patient presents with  ? Follow-up  ?  DOE is improving  ?Patient having trouble with getting neb solution   ? ? ?Constitutional:  ?BP (!) 142/82 (BP Location: Left Arm, Patient Position: Sitting)   Pulse 78   Temp 98.1 ?F (36.7 ?C) (Temporal)   Ht 5' (1.524 m)   Wt 139 lb 9.6 oz (63.3 kg)   SpO2 99% Comment: ra'  BMI 27.26 kg/m?  ? ?Past Medical History:  ?HTN, Sycope, GERD, A fib, Anxiety, Breast cancer, Depression, HTN, TIA, HLD ? ?Past Surgical History:  ?Her  has a past surgical history that includes Cardiac catheterization; Total knee arthroplasty; Total hip arthroplasty; Rotator cuff repair; Mastectomy; ORIF ankle fracture (Left, 06/01/2014); Breast capsulectomy with implant exchange (Right, 05/27/2016); Esophagogastroduodenoscopy (06/2013); Colonoscopy (08/2014); Back surgery; Abdominal hysterectomy; Cholecystectomy; Appendectomy; Breast biopsy (Left); TEE without cardioversion (N/A, 05/17/2020); and Cardioversion (N/A, 05/17/2020). ? ?Brief Summary:  ?Amanda Palmer is a 88 y.o. female with COPD and nocturnal hypoxemia. ?  ? ? ? ?Subjective:  ? ?She is here with her family. ? ?Breathing and cough better.  Still gets occasional cough with clear sputum.  Using albuterol nebulizer bid because she was worried what would happen if she didn't.  She was told at her pharmacy that they couldn't get albuterol right now.  Sleeping okay.  Not having leg swelling.  Trelegy works well. ? ?Physical Exam:  ? ?Appearance - well kempt  ? ?ENMT - no sinus tenderness, no oral exudate, no LAN, Mallampati 3 airway, no stridor ? ?Respiratory - equal breath sounds bilaterally, no wheezing or rales ? ?CV - s1s2 regular rate and rhythm, 2/6 ? ?Ext - no clubbing, no edema ? ?Skin - no rashes ? ?Psych - normal mood and affect ? ? ?  ?Pulmonary testing:  ?PFT 11/09/13 >> FEV1 1.10 (54%), FEV1% 68, TLC 5.17 (102%) ?PFT 11/30/14 >> FEV1 0.89 (44%), FEV1% 64,  TLC 4.42 (87%), RV 3.10 (130%), DLCO 22% ?RAST 02/23/15 >> negative, IgE 4 ?A1AT 07/06/19 >> 137, MM ? ?Chest Imaging:  ?HRCT chest 05/07/19 >> atherosclerosis, scarring at lung bases, numerous small nodules clustered in RML and LLL ? ?Sleep Tests:  ?PSG 11/21/15 >> AHI 0.8, SpO2 low 90% ? ?Cardiac Tests:  ?Echo 12/21/19 >> EF 60 to 65%, grade 2 DD, RVSP 44.7 mmHg, mod LA dilation, mod MR ? ?Social History:  ?She  reports that she has never smoked. She has never used smokeless tobacco. She reports that she does not drink alcohol and does not use drugs. ? ?Family History:  ?Her family history includes Breast cancer in her sister, sister and another family member; Heart disease in her brother and sister; Leukemia in her brother. ?  ? ? ?Assessment/Plan:  ? ?COPD mixed type. ?- continue trelegy 100 one puff daily ?- will see if she can get xopenex for nebulizer since there is national shortage of albuterol vials ?- continue prn albuterol hfa with spacer ?- discussed different roles for her inhalers and when to use them ? ?Nocturnal hypoxemia 2nd to COPD. ?- 2 liters oxygen at night ?- goal SpO2 > 90%; she can use oxygen prn during the day ? ?Persistent atrial fibrillation, chronic diastolic CHF, mitral regurgitation, HLD. ?- followed by Dr. Harl Bowie with [facility] ? ? ?Time Spent Involved in Patient Care on Day of Examination:  ?36 minutes ? ?Follow up:  ? ?Patient Instructions  ?Will see if you  can get xopenex in place of albuterol to use in your nebulizer as needed ? ?Follow up in 6 months ? ?Medication List:  ? ?Allergies as of 06/04/2021   ? ?   Reactions  ? Morphine And Related Nausea And Vomiting  ? Penicillins Rash  ? Reaction: unknown  ? Sulfa Antibiotics Nausea And Vomiting  ? ?  ? ?  ?Medication List  ?  ? ?  ? Accurate as of June 04, 2021 10:06 AM. If you have any questions, ask your nurse or doctor.  ?  ?  ? ?  ? ?acetic acid-hydrocortisone OTIC solution ?Commonly known as: VOSOL-HC ?as needed. ?   ?albuterol 108 (90 Base) MCG/ACT inhaler ?Commonly known as: VENTOLIN HFA ?Inhale 2 puffs into the lungs every 6 (six) hours as needed for wheezing or shortness of breath. ?What changed: Another medication with the same name was removed. Continue taking this medication, and follow the directions you see here. ?Changed by: Chesley Mires, MD ?  ?atorvastatin 20 MG tablet ?Commonly known as: LIPITOR ?Take 20 mg by mouth at bedtime. ?  ?clonazePAM 1 MG tablet ?Commonly known as: KLONOPIN ?Take 1 tablet (1 mg total) by mouth at bedtime. ?  ?diltiazem 120 MG 24 hr capsule ?Commonly known as: CARDIZEM CD ?Take 1 capsule (120 mg total) by mouth daily. (long acting) ?  ?diltiazem 30 MG tablet ?Commonly known as: CARDIZEM ?Take 1 tablet (30 mg total) by mouth every 8 (eight) hours as needed (for heartrate greater than 100). ?  ?dofetilide 125 MCG capsule ?Commonly known as: TIKOSYN ?Take 1 capsule by mouth twice daily ?  ?Eliquis 5 MG Tabs tablet ?Generic drug: apixaban ?Take 1 tablet by mouth twice daily ?  ?esomeprazole 20 MG capsule ?Commonly known as: [place] ?Take 20 mg by mouth daily. ?  ?famotidine 20 MG tablet ?Commonly known as: PEPCID ?Take 20 mg by mouth at bedtime. ?  ?Flutter Devi ?1 Device by Does not apply route in the morning, at noon, in the evening, and at bedtime. ?  ?furosemide 20 MG tablet ?Commonly known as: LASIX ?Take 1 tablet (20 mg total) by mouth as needed for edema (swelling). ?  ?ibandronate 150 MG tablet ?Commonly known as: BONIVA ?Take 150 mg by mouth every 30 (thirty) days. ?  ?levalbuterol 0.63 MG/3ML nebulizer solution ?Commonly known as: XOPENEX ?Take 3 mLs (0.63 mg total) by nebulization every 6 (six) hours as needed for wheezing or shortness of breath. ?Started by: Chesley Mires, MD ?  ?losartan 50 MG tablet ?Commonly known as: COZAAR ?Take 50 mg by mouth daily. ?  ?Magnesium Oxide 250 MG Tabs ?Take 1 tablet (250 mg total) by mouth daily. ?  ?metoprolol succinate 50 MG 24 hr tablet ?Commonly  known as: Toprol XL ?Take 1 tablet (50 mg total) by mouth daily. Take with or immediately following a meal. ?  ?multivitamin with minerals Tabs tablet ?Take 1 tablet by mouth daily. ?  ?OXYGEN ?Inhale 3 L into the lungs at bedtime. ?  ?potassium chloride 10 MEQ tablet ?Commonly known as: KLOR-CON ?Take 2 tablets (20 mEq total) by mouth daily. ?  ?Probiotic Daily Caps ?Take 1 capsule by mouth daily. ?  ?Siltussin SA 100 MG/5ML syrup ?Generic drug: guaifenesin ?Take 5 mLs by mouth every 4 (four) hours as needed. ?  ?Trelegy Ellipta 100-62.5-25 MCG/ACT Aepb ?Generic drug: Fluticasone-Umeclidin-Vilant ?INHALE 1 PUFF BY MOUTH INTO LUNGS DAILY ?  ?triamcinolone cream 0.1 % ?Commonly known as: KENALOG ?Apply 1 application topically daily as  needed (hemorrhoids). ?  ?venlafaxine 75 MG tablet ?Commonly known as: EFFEXOR ?Take 75-150 mg by mouth See admin instructions. Take 150 mg in the morning and 75 mg in the evening ?  ? ?  ? ? ?Signature:  ?Chesley Mires, MD ?[place] ?Pager - [phone] ?06/04/2021, 10:06 AM ?  ? ? ? ? ? ? ? ? ?

## 2021-06-04 NOTE — Patient Instructions (Signed)
Will see if you can get xopenex in place of albuterol to use in your nebulizer as needed ? ?Follow up in 6 months ?

## 2021-06-06 ENCOUNTER — Other Ambulatory Visit: Payer: Self-pay | Admitting: *Deleted

## 2021-06-06 DIAGNOSIS — J449 Chronic obstructive pulmonary disease, unspecified: Secondary | ICD-10-CM | POA: Diagnosis not present

## 2021-06-06 MED ORDER — DILTIAZEM HCL ER COATED BEADS 120 MG PO CP24: 120.0000 mg | ORAL_CAPSULE | Freq: Every day | ORAL | 1 refills | Status: DC

## 2021-06-08 DIAGNOSIS — E1165 Type 2 diabetes mellitus with hyperglycemia: Secondary | ICD-10-CM | POA: Diagnosis not present

## 2021-06-08 DIAGNOSIS — K219 Gastro-esophageal reflux disease without esophagitis: Secondary | ICD-10-CM | POA: Diagnosis not present

## 2021-06-11 DIAGNOSIS — D6869 Other thrombophilia: Secondary | ICD-10-CM | POA: Diagnosis not present

## 2021-06-11 DIAGNOSIS — I4891 Unspecified atrial fibrillation: Secondary | ICD-10-CM | POA: Diagnosis not present

## 2021-06-11 DIAGNOSIS — J9611 Chronic respiratory failure with hypoxia: Secondary | ICD-10-CM | POA: Diagnosis not present

## 2021-06-11 DIAGNOSIS — G8929 Other chronic pain: Secondary | ICD-10-CM | POA: Diagnosis not present

## 2021-06-11 DIAGNOSIS — M792 Neuralgia and neuritis, unspecified: Secondary | ICD-10-CM | POA: Diagnosis not present

## 2021-06-11 DIAGNOSIS — J449 Chronic obstructive pulmonary disease, unspecified: Secondary | ICD-10-CM | POA: Diagnosis not present

## 2021-06-11 DIAGNOSIS — Z008 Encounter for other general examination: Secondary | ICD-10-CM | POA: Diagnosis not present

## 2021-06-11 DIAGNOSIS — I509 Heart failure, unspecified: Secondary | ICD-10-CM | POA: Diagnosis not present

## 2021-06-11 DIAGNOSIS — E261 Secondary hyperaldosteronism: Secondary | ICD-10-CM | POA: Diagnosis not present

## 2021-06-11 DIAGNOSIS — R69 Illness, unspecified: Secondary | ICD-10-CM | POA: Diagnosis not present

## 2021-06-11 DIAGNOSIS — M810 Age-related osteoporosis without current pathological fracture: Secondary | ICD-10-CM | POA: Diagnosis not present

## 2021-06-11 DIAGNOSIS — I110 Hypertensive heart disease with heart failure: Secondary | ICD-10-CM | POA: Diagnosis not present

## 2021-06-19 DIAGNOSIS — J449 Chronic obstructive pulmonary disease, unspecified: Secondary | ICD-10-CM | POA: Diagnosis not present

## 2021-06-21 ENCOUNTER — Telehealth: Payer: Self-pay | Admitting: Cardiology

## 2021-06-21 MED ORDER — DILTIAZEM HCL ER COATED BEADS 120 MG PO CP24: 120.0000 mg | ORAL_CAPSULE | Freq: Every day | ORAL | 0 refills | Status: DC

## 2021-06-21 NOTE — Telephone Encounter (Signed)
?*  STAT* If patient is at the pharmacy, call can be transferred to refill team. ? ? ?1. Which medications need to be refilled? (please list name of each medication and dose if known) diltiazem (CARDIZEM CD) 120 MG 24 hr capsule ? ?2. Which pharmacy/location (including street and city if local pharmacy) is medication to be sent to? [facility] - [place], [place] - [place] [address] ? ?3. Do they need a 30 day or 90 day supply? 90 ? ?

## 2021-07-05 NOTE — Progress Notes (Signed)
? ?PCP:  Celene Squibb, MD ?Primary Cardiologist: Carlyle Dolly, MD ?Electrophysiologist: Thompson Grayer, MD  ? ?Amanda Palmer is a 88 y.o. female seen today for Thompson Grayer, MD for routine electrophysiology followup.  Since last being seen in our clinic the patient reports doing ok overall.  she denies chest pain, palpitations, dyspnea, PND, orthopnea, nausea, vomiting, dizziness, syncope, edema, weight gain, or early satiety. ? ?Past Medical History:  ?Diagnosis Date  ? A-fib ([place])   ? Anxiety   ? Cancer [facility])   ? breast  ? COPD (chronic obstructive pulmonary disease) ([place])   ? Depression   ? Hypertension   ? TIA (transient ischemic attack)   ? remote  ? ?Past Surgical History:  ?Procedure Laterality Date  ? ABDOMINAL HYSTERECTOMY    ? APPENDECTOMY    ? BACK SURGERY    ? total of five surgeries  ? BREAST BIOPSY Left   ? benign  ? BREAST CAPSULECTOMY WITH IMPLANT EXCHANGE Right 05/27/2016  ? Procedure: REMOVAL AND REPLACEMENT OF RIGHT BREAST IMPLANT AND BREAST CAPSULECTOMY;  Surgeon: Cristine Polio, MD;  Location: [place];  Service: Plastics;  Laterality: Right;  ? CARDIAC CATHETERIZATION    ? CARDIOVERSION N/A 05/17/2020  ? Procedure: CARDIOVERSION;  Surgeon: Thayer Headings, MD;  Location: [place];  Service: Cardiovascular;  Laterality: N/A;  ? CHOLECYSTECTOMY    ? COLONOSCOPY  08/2014  ? Dr. Britta Mccreedy: Small sessile polyp at the cecum, removed, tubular adenoma  ? ESOPHAGOGASTRODUODENOSCOPY  06/2013  ? Dr. Britta Mccreedy: Hiatal hernia, Schatzki ring, nonobstructive.  ? MASTECTOMY    ? right   ? ORIF ANKLE FRACTURE Left 06/01/2014  ? Procedure: OPEN REDUCTION INTERNAL FIXATION (ORIF) LEFT ANKLE FRACTURE;  Surgeon: Marybelle Killings, MD;  Location: [place];  Service: Orthopedics;  Laterality: Left;  ? ROTATOR CUFF REPAIR    ? left  ? TEE WITHOUT CARDIOVERSION N/A 05/17/2020  ? Procedure: TRANSESOPHAGEAL ECHOCARDIOGRAM (TEE);  Surgeon: Acie Fredrickson Wonda Cheng, MD;  Location: [place];  Service: Cardiovascular;   Laterality: N/A;  ? TOTAL HIP ARTHROPLASTY    ? right  ? TOTAL KNEE ARTHROPLASTY    ? bilateral  ? ? ?Current Outpatient Medications  ?Medication Sig Dispense Refill  ? acetic acid-hydrocortisone (VOSOL-HC) OTIC solution as needed.    ? albuterol (PROVENTIL HFA;VENTOLIN HFA) 108 (90 Base) MCG/ACT inhaler Inhale 2 puffs into the lungs every 6 (six) hours as needed for wheezing or shortness of breath.    ? atorvastatin (LIPITOR) 20 MG tablet Take 20 mg by mouth at bedtime.    ? clonazePAM (KLONOPIN) 1 MG tablet Take 1 tablet (1 mg total) by mouth at bedtime. 30 tablet 0  ? diltiazem (CARDIZEM CD) 120 MG 24 hr capsule Take 1 capsule (120 mg total) by mouth daily. (long acting) 90 capsule 0  ? diltiazem (CARDIZEM) 30 MG tablet Take 1 tablet (30 mg total) by mouth every 8 (eight) hours as needed (for heartrate greater than 100). 30 tablet 2  ? dofetilide (TIKOSYN) 125 MCG capsule Take 1 capsule by mouth twice daily 180 capsule 2  ? ELIQUIS 5 MG TABS tablet Take 1 tablet by mouth twice daily 60 tablet 6  ? esomeprazole (NEXIUM) 20 MG capsule Take 20 mg by mouth daily.    ? famotidine (PEPCID) 20 MG tablet Take 20 mg by mouth at bedtime.    ? furosemide (LASIX) 20 MG tablet Take 1 tablet (20 mg total) by mouth as needed for edema (swelling).    ?  ibandronate (BONIVA) 150 MG tablet Take 150 mg by mouth every 30 (thirty) days.    ? levalbuterol (XOPENEX) 0.63 MG/3ML nebulizer solution Take 3 mLs (0.63 mg total) by nebulization every 6 (six) hours as needed for wheezing or shortness of breath. 360 mL 12  ? losartan (COZAAR) 50 MG tablet Take 50 mg by mouth daily.    ? Magnesium Oxide 250 MG TABS Take 1 tablet (250 mg total) by mouth daily. 30 tablet 5  ? metoprolol succinate (TOPROL XL) 50 MG 24 hr tablet Take 1 tablet (50 mg total) by mouth daily. Take with or immediately following a meal. 30 tablet 3  ? Multiple Vitamin (MULTIVITAMIN WITH MINERALS) TABS tablet Take 1 tablet by mouth daily.    ? OXYGEN Inhale 3 L into the  lungs at bedtime.    ? potassium chloride (KLOR-CON) 10 MEQ tablet Take 2 tablets (20 mEq total) by mouth daily. 180 tablet 2  ? Probiotic Product (PROBIOTIC DAILY) CAPS Take 1 capsule by mouth daily.    ? Respiratory Therapy Supplies (FLUTTER) DEVI 1 Device by Does not apply route in the morning, at noon, in the evening, and at bedtime. 1 each 0  ? SILTUSSIN SA 100 MG/5ML syrup Take 5 mLs by mouth every 4 (four) hours as needed.    ? TRELEGY ELLIPTA 100-62.5-25 MCG/ACT AEPB INHALE 1 PUFF BY MOUTH INTO LUNGS DAILY 60 each 5  ? triamcinolone cream (KENALOG) 0.1 % Apply 1 application topically daily as needed (hemorrhoids).    ? venlafaxine (EFFEXOR) 75 MG tablet Take 75-150 mg by mouth See admin instructions. Take 150 mg in the morning and 75 mg in the evening    ? ?No current facility-administered medications for this visit.  ? ? ?Allergies  ?Allergen Reactions  ? Morphine And Related Nausea And Vomiting  ? Penicillins Rash  ?  Reaction: unknown  ? Sulfa Antibiotics Nausea And Vomiting  ? ? ?Social History  ? ?Socioeconomic History  ? Marital status: Widowed  ?  Spouse name: Not on file  ? Number of children: Not on file  ? Years of education: Not on file  ? Highest education level: Not on file  ?Occupational History  ? Not on file  ?Tobacco Use  ? Smoking status: Never  ? Smokeless tobacco: Never  ?Vaping Use  ? Vaping Use: Never used  ?Substance and Sexual Activity  ? Alcohol use: No  ?  Alcohol/week: 0.0 standard drinks  ? Drug use: No  ? Sexual activity: Not on file  ?Other Topics Concern  ? Not on file  ?Social History Narrative  ? Not on file  ? ?Social Determinants of Health  ? ?Financial Resource Strain: Not on file  ?Food Insecurity: Not on file  ?Transportation Needs: Not on file  ?Physical Activity: Not on file  ?Stress: Not on file  ?Social Connections: Not on file  ?Intimate Partner Violence: Not on file  ? ? ? ?Review of Systems: ?All other systems reviewed and are otherwise negative except as noted  above. ? ?Physical Exam: ?There were no vitals filed for this visit. ? ?GEN- The patient is well appearing, alert and oriented x 3 today.   ?HEENT: normocephalic, atraumatic; sclera clear, conjunctiva pink; hearing intact; oropharynx clear; neck supple, no JVP ?Lymph- no cervical lymphadenopathy ?Lungs- Clear to ausculation bilaterally, normal work of breathing.  No wheezes, rales, rhonchi ?Heart- Regular rate and rhythm, no murmurs, rubs or gallops, PMI not laterally displaced ?GI- soft, non-tender, non-distended, bowel  sounds present, no hepatosplenomegaly ?Extremities- no clubbing, cyanosis, or edema; DP/PT/radial pulses 2+ bilaterally ?MS- no significant deformity or atrophy ?Skin- warm and dry, no rash or lesion ?Psych- euthymic mood, full affect ?Neuro- strength and sensation are intact ? ?EKG is ordered. Personal review shows NSR at 69 bpm with stable QTc ? ?Additional studies reviewed include: ?Previous EP office notes.  ? ?Assessment and Plan: ? ?1. Persistent atrial fibrillation ?EKG today shows NSR with stable QTc on Tikosyn 125 mcg BID ?She is not candidate for ablation ?Continue eliquis for CHA2DS2VASC of at least 8.   ?BMET and Mg today ?  ?2. NICM ?Volume status stable on exam ?Echo TEE 05/17/2020 with LVEF 30-35%. Consider repeating in several months of maintaining NSR. ?Continue losartan and Toprol. Medication titration has been limited by hypotension, poor candidate for Entresto with overall soft pressures.  ?Could consider low dose spiro if EF remains depressed despite NSR.  ?  ?3. Secondary Hypercoagulable State (ICD10:  D68.69) ?The patient is at significant risk for stroke/thromboembolism based upon her CHA2DS2-VASc Score of 8.  Continue Apixaban (Eliquis).  ?Labs today. ? ?Follow up with Dr. Quentin Ore in 6 months to establish ? ?Shirley Friar, PA-C  ?07/05/21 ?10:39 AM  ?

## 2021-07-06 ENCOUNTER — Encounter: Payer: Self-pay | Admitting: Student

## 2021-07-06 ENCOUNTER — Ambulatory Visit: Payer: Medicare HMO | Admitting: Student

## 2021-07-06 VITALS — BP 124/62 | HR 77 | Ht 60.0 in | Wt 140.2 lb

## 2021-07-06 DIAGNOSIS — I4819 Other persistent atrial fibrillation: Secondary | ICD-10-CM

## 2021-07-06 LAB — BASIC METABOLIC PANEL
BUN/Creatinine Ratio: 15 (ref 12–28)
BUN: 14 mg/dL (ref 8–27)
CO2: 24 mmol/L (ref 20–29)
Calcium: 9.4 mg/dL (ref 8.7–10.3)
Chloride: 101 mmol/L (ref 96–106)
Creatinine, Ser: 0.92 mg/dL (ref 0.57–1.00)
Glucose: 85 mg/dL (ref 70–99)
Potassium: 4 mmol/L (ref 3.5–5.2)
Sodium: 140 mmol/L (ref 134–144)
eGFR: 61 mL/min/{1.73_m2} (ref >59)

## 2021-07-06 LAB — MAGNESIUM: Magnesium: 1.9 mg/dL (ref 1.6–2.3)

## 2021-07-06 MED ORDER — DILTIAZEM HCL ER COATED BEADS 120 MG PO CP24: 120.0000 mg | ORAL_CAPSULE | Freq: Every day | ORAL | 3 refills | Status: DC

## 2021-07-06 NOTE — Patient Instructions (Addendum)
Medication Instructions:  ?Your physician recommends that you continue on your current medications as directed. Please refer to the Current Medication list given to you today. ? ?*If you need a refill on your cardiac medications before your next appointment, please call your pharmacy* ? ? ?Lab Work: ?TODAY: [facility], [place] ? ?If you have labs (blood work) drawn today and your tests are completely normal, you will receive your results only by: ?MyChart Message (if you have MyChart) OR ?A paper copy in the mail ?If you have any lab test that is abnormal or we need to change your treatment, we will call you to review the results. ? ? ?Follow-Up: ?At [facility], you and your health needs are our priority.  As part of our continuing mission to provide you with exceptional heart care, we have created designated Provider Care Teams.  These Care Teams include your primary Cardiologist (physician) and Advanced Practice Providers (APPs -  Physician Assistants and Nurse Practitioners) who all work together to provide you with the care you need, when you need it. ? ?We recommend signing up for the patient portal called "MyChart".  Sign up information is provided on this After Visit Summary.  MyChart is used to connect with patients for Virtual Visits (Telemedicine).  Patients are able to view lab/test results, encounter notes, upcoming appointments, etc.  Non-urgent messages can be sent to your provider as well.   ?To learn more about what you can do with MyChart, go to [email or web address].   ? ?Your next appointment:   ?6 month(s) ? ?The format for your next appointment:   ?In Person ? ?Provider:   ?Lars Mage, MD{ ? ?

## 2021-07-07 DIAGNOSIS — J449 Chronic obstructive pulmonary disease, unspecified: Secondary | ICD-10-CM | POA: Diagnosis not present

## 2021-07-08 DIAGNOSIS — I482 Chronic atrial fibrillation, unspecified: Secondary | ICD-10-CM | POA: Diagnosis not present

## 2021-07-08 DIAGNOSIS — I10 Essential (primary) hypertension: Secondary | ICD-10-CM | POA: Diagnosis not present

## 2021-07-08 DIAGNOSIS — J449 Chronic obstructive pulmonary disease, unspecified: Secondary | ICD-10-CM | POA: Diagnosis not present

## 2021-07-08 DIAGNOSIS — E1165 Type 2 diabetes mellitus with hyperglycemia: Secondary | ICD-10-CM | POA: Diagnosis not present

## 2021-07-09 ENCOUNTER — Other Ambulatory Visit: Payer: Self-pay

## 2021-07-09 DIAGNOSIS — Z9981 Dependence on supplemental oxygen: Secondary | ICD-10-CM | POA: Insufficient documentation

## 2021-07-09 DIAGNOSIS — Z7901 Long term (current) use of anticoagulants: Secondary | ICD-10-CM | POA: Insufficient documentation

## 2021-07-09 MED ORDER — MAGNESIUM OXIDE 400 MG PO CAPS: 400.0000 mg | ORAL_CAPSULE | Freq: Every day | ORAL | 3 refills | Status: AC

## 2021-07-09 NOTE — Addendum Note (Signed)
Addended by: Janan Halter F on: 07/09/2021 03:24 PM ? ? Modules accepted: Orders ? ?

## 2021-07-17 DIAGNOSIS — M79671 Pain in right foot: Secondary | ICD-10-CM | POA: Diagnosis not present

## 2021-07-17 DIAGNOSIS — M79672 Pain in left foot: Secondary | ICD-10-CM | POA: Diagnosis not present

## 2021-07-17 DIAGNOSIS — I739 Peripheral vascular disease, unspecified: Secondary | ICD-10-CM | POA: Diagnosis not present

## 2021-07-17 DIAGNOSIS — L110 Acquired keratosis follicularis: Secondary | ICD-10-CM | POA: Diagnosis not present

## 2021-07-19 DIAGNOSIS — J449 Chronic obstructive pulmonary disease, unspecified: Secondary | ICD-10-CM | POA: Diagnosis not present

## 2021-08-06 DIAGNOSIS — J449 Chronic obstructive pulmonary disease, unspecified: Secondary | ICD-10-CM | POA: Diagnosis not present

## 2021-08-07 ENCOUNTER — Other Ambulatory Visit (HOSPITAL_COMMUNITY): Payer: Self-pay | Admitting: Nurse Practitioner

## 2021-08-07 ENCOUNTER — Other Ambulatory Visit (HOSPITAL_COMMUNITY): Payer: Self-pay | Admitting: Family Medicine

## 2021-08-07 DIAGNOSIS — R109 Unspecified abdominal pain: Secondary | ICD-10-CM | POA: Diagnosis not present

## 2021-08-07 DIAGNOSIS — E669 Obesity, unspecified: Secondary | ICD-10-CM | POA: Diagnosis not present

## 2021-08-07 DIAGNOSIS — R059 Cough, unspecified: Secondary | ICD-10-CM | POA: Diagnosis not present

## 2021-08-07 DIAGNOSIS — R197 Diarrhea, unspecified: Secondary | ICD-10-CM | POA: Diagnosis not present

## 2021-08-07 DIAGNOSIS — J449 Chronic obstructive pulmonary disease, unspecified: Secondary | ICD-10-CM | POA: Diagnosis not present

## 2021-08-07 DIAGNOSIS — Z6830 Body mass index (BMI) 30.0-30.9, adult: Secondary | ICD-10-CM | POA: Diagnosis not present

## 2021-08-08 ENCOUNTER — Ambulatory Visit (HOSPITAL_COMMUNITY)
Admission: RE | Admit: 2021-08-08 | Discharge: 2021-08-08 | Disposition: A | Discharge status: Home / Self Care | Payer: Medicare HMO | Source: Ambulatory Visit | Attending: Nurse Practitioner | Admitting: Nurse Practitioner

## 2021-08-08 DIAGNOSIS — R197 Diarrhea, unspecified: Secondary | ICD-10-CM | POA: Diagnosis not present

## 2021-08-08 DIAGNOSIS — R109 Unspecified abdominal pain: Secondary | ICD-10-CM | POA: Diagnosis not present

## 2021-08-08 DIAGNOSIS — J449 Chronic obstructive pulmonary disease, unspecified: Secondary | ICD-10-CM | POA: Diagnosis not present

## 2021-08-08 DIAGNOSIS — E1165 Type 2 diabetes mellitus with hyperglycemia: Secondary | ICD-10-CM | POA: Diagnosis not present

## 2021-08-08 DIAGNOSIS — I482 Chronic atrial fibrillation, unspecified: Secondary | ICD-10-CM | POA: Diagnosis not present

## 2021-08-08 DIAGNOSIS — I10 Essential (primary) hypertension: Secondary | ICD-10-CM | POA: Diagnosis not present

## 2021-08-09 ENCOUNTER — Telehealth: Payer: Self-pay | Admitting: Internal Medicine

## 2021-08-09 ENCOUNTER — Encounter (HOSPITAL_COMMUNITY): Payer: Self-pay

## 2021-08-09 ENCOUNTER — Emergency Department (HOSPITAL_COMMUNITY): Payer: Medicare HMO

## 2021-08-09 ENCOUNTER — Observation Stay (HOSPITAL_COMMUNITY)
Admission: EM | Admit: 2021-08-09 | Discharge: 2021-08-11 | Disposition: A | Discharge status: Home Health | Payer: Medicare HMO | Attending: Internal Medicine | Admitting: Internal Medicine

## 2021-08-09 ENCOUNTER — Other Ambulatory Visit: Payer: Self-pay

## 2021-08-09 ENCOUNTER — Observation Stay (HOSPITAL_COMMUNITY): Payer: Medicare HMO

## 2021-08-09 DIAGNOSIS — I739 Peripheral vascular disease, unspecified: Secondary | ICD-10-CM | POA: Diagnosis present

## 2021-08-09 DIAGNOSIS — M4312 Spondylolisthesis, cervical region: Secondary | ICD-10-CM | POA: Diagnosis not present

## 2021-08-09 DIAGNOSIS — J449 Chronic obstructive pulmonary disease, unspecified: Secondary | ICD-10-CM | POA: Diagnosis present

## 2021-08-09 DIAGNOSIS — Z96653 Presence of artificial knee joint, bilateral: Secondary | ICD-10-CM | POA: Insufficient documentation

## 2021-08-09 DIAGNOSIS — Z853 Personal history of malignant neoplasm of breast: Secondary | ICD-10-CM | POA: Diagnosis not present

## 2021-08-09 DIAGNOSIS — E860 Dehydration: Secondary | ICD-10-CM | POA: Insufficient documentation

## 2021-08-09 DIAGNOSIS — S199XXA Unspecified injury of neck, initial encounter: Secondary | ICD-10-CM | POA: Diagnosis not present

## 2021-08-09 DIAGNOSIS — Z79899 Other long term (current) drug therapy: Secondary | ICD-10-CM | POA: Insufficient documentation

## 2021-08-09 DIAGNOSIS — R197 Diarrhea, unspecified: Secondary | ICD-10-CM | POA: Insufficient documentation

## 2021-08-09 DIAGNOSIS — I6782 Cerebral ischemia: Secondary | ICD-10-CM | POA: Diagnosis not present

## 2021-08-09 DIAGNOSIS — Z96641 Presence of right artificial hip joint: Secondary | ICD-10-CM | POA: Diagnosis not present

## 2021-08-09 DIAGNOSIS — R55 Syncope and collapse: Secondary | ICD-10-CM | POA: Insufficient documentation

## 2021-08-09 DIAGNOSIS — I4821 Permanent atrial fibrillation: Secondary | ICD-10-CM | POA: Diagnosis not present

## 2021-08-09 DIAGNOSIS — R531 Weakness: Secondary | ICD-10-CM | POA: Insufficient documentation

## 2021-08-09 DIAGNOSIS — R42 Dizziness and giddiness: Secondary | ICD-10-CM | POA: Diagnosis not present

## 2021-08-09 DIAGNOSIS — J42 Unspecified chronic bronchitis: Secondary | ICD-10-CM

## 2021-08-09 DIAGNOSIS — I10 Essential (primary) hypertension: Secondary | ICD-10-CM | POA: Insufficient documentation

## 2021-08-09 DIAGNOSIS — R109 Unspecified abdominal pain: Secondary | ICD-10-CM | POA: Diagnosis not present

## 2021-08-09 DIAGNOSIS — Z7901 Long term (current) use of anticoagulants: Secondary | ICD-10-CM | POA: Insufficient documentation

## 2021-08-09 DIAGNOSIS — R0602 Shortness of breath: Secondary | ICD-10-CM | POA: Diagnosis not present

## 2021-08-09 DIAGNOSIS — Z8673 Personal history of transient ischemic attack (TIA), and cerebral infarction without residual deficits: Secondary | ICD-10-CM | POA: Diagnosis not present

## 2021-08-09 DIAGNOSIS — R008 Other abnormalities of heart beat: Secondary | ICD-10-CM | POA: Diagnosis present

## 2021-08-09 DIAGNOSIS — G319 Degenerative disease of nervous system, unspecified: Secondary | ICD-10-CM | POA: Diagnosis not present

## 2021-08-09 DIAGNOSIS — M47812 Spondylosis without myelopathy or radiculopathy, cervical region: Secondary | ICD-10-CM | POA: Diagnosis not present

## 2021-08-09 DIAGNOSIS — I4819 Other persistent atrial fibrillation: Secondary | ICD-10-CM | POA: Diagnosis present

## 2021-08-09 DIAGNOSIS — S0990XA Unspecified injury of head, initial encounter: Secondary | ICD-10-CM | POA: Diagnosis not present

## 2021-08-09 DIAGNOSIS — I679 Cerebrovascular disease, unspecified: Secondary | ICD-10-CM | POA: Diagnosis present

## 2021-08-09 DIAGNOSIS — M2578 Osteophyte, vertebrae: Secondary | ICD-10-CM | POA: Diagnosis not present

## 2021-08-09 DIAGNOSIS — Z9181 History of falling: Secondary | ICD-10-CM | POA: Insufficient documentation

## 2021-08-09 DIAGNOSIS — I482 Chronic atrial fibrillation, unspecified: Secondary | ICD-10-CM | POA: Diagnosis present

## 2021-08-09 LAB — BASIC METABOLIC PANEL
Anion gap: 5 (ref 5–15)
BUN: 12 mg/dL (ref 8–23)
CO2: 29 mmol/L (ref 22–32)
Calcium: 9.3 mg/dL (ref 8.9–10.3)
Chloride: 104 mmol/L (ref 98–111)
Creatinine, Ser: 0.87 mg/dL (ref 0.44–1.00)
GFR, Estimated: >60 mL/min (ref >60)
Glucose, Bld: 79 mg/dL (ref 70–99)
Potassium: 4.2 mmol/L (ref 3.5–5.1)
Sodium: 138 mmol/L (ref 135–145)

## 2021-08-09 LAB — CBC WITH DIFFERENTIAL/PLATELET
Abs Immature Granulocytes: 0.02 10*3/uL (ref 0.00–0.07)
Basophils Absolute: 0 10*3/uL (ref 0.0–0.1)
Basophils Relative: 0 %
Eosinophils Absolute: 0.1 10*3/uL (ref 0.0–0.5)
Eosinophils Relative: 3 %
HCT: 33.8 % — ABNORMAL LOW (ref 36.0–46.0)
Hemoglobin: 10.6 g/dL — ABNORMAL LOW (ref 12.0–15.0)
Immature Granulocytes: 0 %
Lymphocytes Relative: 13 %
Lymphs Abs: 0.7 10*3/uL (ref 0.7–4.0)
MCH: 30.6 pg (ref 26.0–34.0)
MCHC: 31.4 g/dL (ref 30.0–36.0)
MCV: 97.7 fL (ref 80.0–100.0)
Monocytes Absolute: 0.3 10*3/uL (ref 0.1–1.0)
Monocytes Relative: 6 %
Neutro Abs: 4.2 10*3/uL (ref 1.7–7.7)
Neutrophils Relative %: 78 %
Platelets: 198 10*3/uL (ref 150–400)
RBC: 3.46 MIL/uL — ABNORMAL LOW (ref 3.87–5.11)
RDW: 13.3 % (ref 11.5–15.5)
WBC: 5.4 10*3/uL (ref 4.0–10.5)
nRBC: 0 % (ref 0.0–0.2)

## 2021-08-09 LAB — MAGNESIUM: Magnesium: 2.2 mg/dL (ref 1.7–2.4)

## 2021-08-09 MED ORDER — DOFETILIDE 125 MCG PO CAPS
125.0000 ug | ORAL_CAPSULE | Freq: Two times a day (BID) | ORAL | Status: DC
  Administered 2021-08-09 – 2021-08-11 (×4): 125 ug via ORAL
  Filled 2021-08-09 (×5): qty 1

## 2021-08-09 MED ORDER — METOPROLOL SUCCINATE ER 50 MG PO TB24
50.0000 mg | ORAL_TABLET | Freq: Every day | ORAL | Status: DC
  Administered 2021-08-09 – 2021-08-11 (×3): 50 mg via ORAL
  Filled 2021-08-09 (×2): qty 1
  Filled 2021-08-09: qty 2

## 2021-08-09 MED ORDER — SODIUM CHLORIDE 0.9 % IV BOLUS (SEPSIS)
500.0000 mL | Freq: Once | INTRAVENOUS | Status: AC
  Administered 2021-08-09: 500 mL via INTRAVENOUS

## 2021-08-09 MED ORDER — LOSARTAN POTASSIUM 50 MG PO TABS
50.0000 mg | ORAL_TABLET | Freq: Every day | ORAL | Status: DC
  Administered 2021-08-10 – 2021-08-11 (×2): 50 mg via ORAL
  Filled 2021-08-09 (×2): qty 1

## 2021-08-09 MED ORDER — ACETAMINOPHEN 500 MG PO TABS: 500.0000 mg | ORAL_TABLET | Freq: Four times a day (QID) | ORAL | Status: DC | PRN

## 2021-08-09 MED ORDER — UMECLIDINIUM BROMIDE 62.5 MCG/ACT IN AEPB
1.0000 | INHALATION_SPRAY | Freq: Every day | RESPIRATORY_TRACT | Status: DC
  Administered 2021-08-09 – 2021-08-11 (×2): 1 via RESPIRATORY_TRACT
  Filled 2021-08-09: qty 7

## 2021-08-09 MED ORDER — APIXABAN 5 MG PO TABS
5.0000 mg | ORAL_TABLET | Freq: Two times a day (BID) | ORAL | Status: DC
  Administered 2021-08-09 – 2021-08-11 (×4): 5 mg via ORAL
  Filled 2021-08-09 (×5): qty 1

## 2021-08-09 MED ORDER — VENLAFAXINE HCL 75 MG PO TABS
75.0000 mg | ORAL_TABLET | Freq: Two times a day (BID) | ORAL | Status: DC
  Administered 2021-08-10 – 2021-08-11 (×3): 150 mg via ORAL
  Filled 2021-08-09 (×5): qty 2

## 2021-08-09 MED ORDER — LOSARTAN POTASSIUM 50 MG PO TABS
50.0000 mg | ORAL_TABLET | Freq: Once | ORAL | Status: AC
Start: 2021-08-09 — End: 2021-08-09
  Administered 2021-08-09: 50 mg via ORAL
  Filled 2021-08-09: qty 1

## 2021-08-09 MED ORDER — SODIUM CHLORIDE 0.9 % IV SOLN
1000.0000 mL | INTRAVENOUS | Status: AC
  Administered 2021-08-09 – 2021-08-10 (×2): 1000 mL via INTRAVENOUS

## 2021-08-09 MED ORDER — SODIUM CHLORIDE 0.9 % IV SOLN
1000.0000 mL | INTRAVENOUS | Status: DC
  Administered 2021-08-09: 1000 mL via INTRAVENOUS

## 2021-08-09 MED ORDER — PANTOPRAZOLE SODIUM 40 MG PO TBEC
40.0000 mg | DELAYED_RELEASE_TABLET | Freq: Every day | ORAL | Status: DC
  Administered 2021-08-09 – 2021-08-11 (×3): 40 mg via ORAL
  Filled 2021-08-09 (×3): qty 1

## 2021-08-09 MED ORDER — FLUTICASONE FUROATE-VILANTEROL 100-25 MCG/ACT IN AEPB
1.0000 | INHALATION_SPRAY | Freq: Every day | RESPIRATORY_TRACT | Status: DC
  Administered 2021-08-09 – 2021-08-11 (×2): 1 via RESPIRATORY_TRACT
  Filled 2021-08-09: qty 28

## 2021-08-09 MED ORDER — CLONAZEPAM 0.5 MG PO TABS
1.0000 mg | ORAL_TABLET | Freq: Every day | ORAL | Status: DC
  Administered 2021-08-09 – 2021-08-10 (×2): 1 mg via ORAL
  Filled 2021-08-09 (×2): qty 2

## 2021-08-09 MED ORDER — DILTIAZEM HCL ER COATED BEADS 120 MG PO CP24
120.0000 mg | ORAL_CAPSULE | Freq: Every day | ORAL | Status: DC
  Administered 2021-08-10 – 2021-08-11 (×2): 120 mg via ORAL
  Filled 2021-08-09 (×2): qty 1

## 2021-08-09 MED ORDER — ATORVASTATIN CALCIUM 10 MG PO TABS
20.0000 mg | ORAL_TABLET | Freq: Every day | ORAL | Status: DC
  Administered 2021-08-09 – 2021-08-10 (×2): 20 mg via ORAL
  Filled 2021-08-09 (×2): qty 2

## 2021-08-09 NOTE — ED Provider Notes (Signed)
[facility] EMERGENCY DEPARTMENT Provider Note   CSN: [id number] Arrival date & time: 08/09/21  1520     History  Chief Complaint  Patient presents with   Irregular Heart Beat    Amanda Palmer is a 88 y.o. female.  HPI  Patient presents to the ED for evaluation of syncope and possible mixup of her medications.  Patient has been having issues with diarrhea for couple of weeks.  She is also having episodes of feeling lightheaded and dizzy.  Sounds like she had a possible syncopal episode a few days ago.  Patient reported to a family member feeling lightheaded and flying through the air.  She was found lying on the ground.  She denies any serious injuries from that fall.  Patient's family called the cardiologist and they mentioned that they think the patient could be mixing up her Cardizem and Tikosyn medication.  They feel that she has more of the Tikosyn that she should and less of Cardizem.  I instructed the patient to go to the emergency room for further evaluation  Home Medications Prior to Admission medications   Medication Sig Start Date End Date Taking? Authorizing Provider  acetaminophen (TYLENOL) 500 MG tablet Take 500 mg by mouth every 6 (six) hours as needed for moderate pain.   Yes [provider]  acetic acid-hydrocortisone (VOSOL-HC) OTIC solution Place 2 drops into both ears at bedtime as needed (itching). 08/31/20  Yes [provider]  albuterol (PROVENTIL HFA;VENTOLIN HFA) 108 (90 Base) MCG/ACT inhaler Inhale 2 puffs into the lungs every 6 (six) hours as needed for wheezing or shortness of breath.   Yes [provider]  atorvastatin (LIPITOR) 20 MG tablet Take 20 mg by mouth at bedtime. 09/23/16  Yes [provider]  calcium carbonate (TUMS - DOSED IN MG ELEMENTAL CALCIUM) 500 MG chewable tablet Chew 500 mg by mouth daily.   Yes [provider]  clonazePAM (KLONOPIN) 1 MG tablet Take 1 tablet (1 mg total) by  mouth at bedtime. 06/06/14  Yes Lauree Chandler, NP  diltiazem (CARDIZEM CD) 120 MG 24 hr capsule Take 1 capsule (120 mg total) by mouth daily. (long acting) 07/06/21  Yes Tillery, Satira Mccallum, PA-C  diltiazem (CARDIZEM) 30 MG tablet Take 1 tablet (30 mg total) by mouth every 8 (eight) hours as needed (for heartrate greater than 100). 06/30/20  Yes Fenton, Clint R, PA  dofetilide (TIKOSYN) 125 MCG capsule Take 1 capsule by mouth twice daily Patient taking differently: Take 125 mcg by mouth 2 (two) times daily. 02/26/21  Yes Allred, Jeneen Rinks, MD  ELIQUIS 5 MG TABS tablet Take 1 tablet by mouth twice daily Patient taking differently: Take 5 mg by mouth 2 (two) times daily. 06/23/18  Yes Herminio Commons, MD  esomeprazole (NEXIUM) 20 MG capsule Take 20 mg by mouth every evening.   Yes [provider]  furosemide (LASIX) 20 MG tablet Take 1 tablet (20 mg total) by mouth as needed for edema (swelling). Patient taking differently: Take 20 mg by mouth daily as needed for edema (swelling). 01/10/21  Yes Branch, Alphonse Guild, MD  ibandronate (BONIVA) 150 MG tablet Take 150 mg by mouth every 30 (thirty) days. 04/13/20  Yes [provider]  levalbuterol (XOPENEX) 0.63 MG/3ML nebulizer solution Take 3 mLs (0.63 mg total) by nebulization every 6 (six) hours as needed for wheezing or shortness of breath. Patient taking differently: Take 0.63 mg by nebulization 2 (two) times daily. 06/04/21  Yes  Chesley Mires, MD  losartan (COZAAR) 50 MG tablet Take 50 mg by mouth daily. 02/18/19  Yes [provider]  Magnesium Oxide 400 MG CAPS Take 1 capsule (400 mg total) by mouth daily. Patient taking differently: Take 400 mg by mouth every evening. 07/09/21  Yes Shirley Friar, PA-C  metoprolol succinate (TOPROL XL) 50 MG 24 hr tablet Take 1 tablet (50 mg total) by mouth daily. Take with or immediately following a meal. 05/24/20 08/09/21 Yes Fenton, Clint R, PA  Multiple Vitamin (MULTIVITAMIN WITH  MINERALS) TABS tablet Take 1 tablet by mouth every evening.   Yes [provider]  OXYGEN Inhale 2 L into the lungs continuous.   Yes [provider]  potassium chloride (KLOR-CON) 10 MEQ tablet Take 2 tablets (20 mEq total) by mouth daily. 05/28/21  Yes Allred, Jeneen Rinks, MD  Probiotic Product (PROBIOTIC DAILY) CAPS Take 1 capsule by mouth daily.   Yes [provider]  SILTUSSIN SA 100 MG/5ML syrup Take 5 mLs by mouth every 4 (four) hours as needed for cough. 05/11/20  Yes [provider]  TRELEGY ELLIPTA 100-62.5-25 MCG/ACT AEPB INHALE 1 PUFF BY MOUTH INTO LUNGS DAILY Patient taking differently: Inhale 1 puff into the lungs at bedtime. 05/25/21  Yes Chesley Mires, MD  triamcinolone cream (KENALOG) 0.1 % Apply 1 application topically daily as needed (hemorrhoids).   Yes [provider]  venlafaxine (EFFEXOR) 75 MG tablet Take 75-150 mg by mouth 2 (two) times daily with a meal. Takes 150 mg in the morning and 50 mg at night   Yes [provider]  Respiratory Therapy Supplies (FLUTTER) DEVI 1 Device by Does not apply route in the morning, at noon, in the evening, and at bedtime. 04/27/19   Candee Furbish, MD      Allergies    Morphine and related, Penicillins, and Sulfa antibiotics    Review of Systems   Review of Systems  Constitutional:  Negative for fever.   Physical Exam Updated Vital Signs BP (!) 199/75   Pulse 80   Temp 98.6 F (37 C) (Oral)   Resp 17   Ht 1.524 m (5')   Wt 63.5 kg   SpO2 100%   BMI 27.34 kg/m  Physical Exam Vitals and nursing note reviewed.  Constitutional:      General: She is not in acute distress.    Appearance: She is well-developed.  HENT:     Head: Normocephalic and atraumatic.     Right Ear: External ear normal.     Left Ear: External ear normal.  Eyes:     General: No scleral icterus.       Right eye: No discharge.        Left eye: No discharge.     Conjunctiva/sclera: Conjunctivae normal.  Neck:      Trachea: No tracheal deviation.  Cardiovascular:     Rate and Rhythm: Normal rate and regular rhythm.  Pulmonary:     Effort: Pulmonary effort is normal. No respiratory distress.     Breath sounds: Normal breath sounds. No stridor. No wheezing or rales.  Abdominal:     General: Bowel sounds are normal. There is no distension.     Palpations: Abdomen is soft.     Tenderness: There is no abdominal tenderness. There is no guarding or rebound.  Musculoskeletal:        General: No tenderness or deformity.     Cervical back: Neck supple.  Skin:    General:  Skin is warm and dry.     Findings: No rash.  Neurological:     General: No focal deficit present.     Mental Status: She is alert.     Cranial Nerves: No cranial nerve deficit (no facial droop, extraocular movements intact, no slurred speech).     Sensory: No sensory deficit.     Motor: No abnormal muscle tone or seizure activity.     Coordination: Coordination normal.  Psychiatric:        Mood and Affect: Mood normal.    ED Results / Procedures / Treatments   Labs (all labs ordered are listed, but only abnormal results are displayed) Labs Reviewed  CBC WITH DIFFERENTIAL/PLATELET - Abnormal; Notable for the following components:      Result Value   RBC 3.46 (*)    Hemoglobin 10.6 (*)    HCT 33.8 (*)    All other components within normal limits  BASIC METABOLIC PANEL  MAGNESIUM    EKG EKG Interpretation  Date/Time:  Thursday August 09 2021 15:23:49 EDT Ventricular Rate:  65 PR Interval:  188 QRS Duration: 88 QT Interval:  408 QTC Calculation: 424 R Axis:   -42 Text Interpretation: Normal sinus rhythm Left axis deviation Moderate voltage criteria for LVH, may be normal variant ( R in aVL , Cornell product ) Septal infarct , age undetermined Abnormal ECG When compared with ECG of 30-Jun-2020 11:42, No significant change since last tracing Confirmed by Dorie Rank [phone]) on 08/09/2021 4:02:43 PM  Radiology DG Chest 2  View  Result Date: 08/09/2021 CLINICAL DATA:  Provided history: Shortness of breath. Additional history provided: Syncopal episode today. History of atrial fibrillation, COPD, breast cancer, hypertension, TIA. EXAM: CHEST - 2 VIEW COMPARISON:  Prior chest radiographs 12/13/2019 and earlier. FINDINGS: Heart size within normal limits. No appreciable airspace consolidation. No evidence of pleural effusion or pneumothorax. No acute bony abnormality identified. Dextrocurvature of the thoracic and visualized upper lumbar spine. Thoracolumbar spondylosis. IMPRESSION: No evidence of acute cardiopulmonary abnormality. Aortic Atherosclerosis (ICD10-I70.0). Thoracolumbar spondylosis and dextrocurvature. Electronically Signed   By: Kellie Simmering D.O.   On: 08/09/2021 16:10   CT Head Wo Contrast  Result Date: 08/09/2021 CLINICAL DATA:  Status post trauma. EXAM: CT HEAD WITHOUT CONTRAST TECHNIQUE: Contiguous axial images were obtained from the base of the skull through the vertex without intravenous contrast. RADIATION DOSE REDUCTION: This exam was performed according to the departmental dose-optimization program which includes automated exposure control, adjustment of the mA and/or kV according to patient size and/or use of iterative reconstruction technique. COMPARISON:  None Available. FINDINGS: Brain: There is mild cerebral atrophy with widening of the extra-axial spaces and ventricular dilatation. There are areas of decreased attenuation within the white matter tracts of the supratentorial brain, consistent with microvascular disease changes. Vascular: No hyperdense vessel or unexpected calcification. Skull: Normal. Negative for fracture or focal lesion. Sinuses/Orbits: There is mild bilateral ethmoid sinus mucosal thickening. Other: None. IMPRESSION: 1. No acute intracranial abnormality. 2. Generalized cerebral atrophy with chronic white matter small vessel ischemic changes. Electronically Signed   By: Virgina Norfolk  M.D.   On: 08/09/2021 16:22   CT Cervical Spine Wo Contrast  Result Date: 08/09/2021 CLINICAL DATA:  Status post trauma. EXAM: CT CERVICAL SPINE WITHOUT CONTRAST TECHNIQUE: Multidetector CT imaging of the cervical spine was performed without intravenous contrast. Multiplanar CT image reconstructions were also generated. RADIATION DOSE REDUCTION: This exam was performed according to the departmental dose-optimization program which includes automated exposure control,  adjustment of the mA and/or kV according to patient size and/or use of iterative reconstruction technique. COMPARISON:  May 31, 2014 FINDINGS: Alignment: Stepwise, approximately 2 mm anterolisthesis is seen at the levels of C4-C5 and C5-C6. Skull base and vertebrae: No acute fracture. Chronic and degenerative changes are seen involving the body and tip of the dens. No primary bone lesion or focal pathologic process. Soft tissues and spinal canal: No prevertebral fluid or swelling. No visible canal hematoma. Disc levels: Marked severity endplate sclerosis and mild anterior osteophyte formation are seen at the level of C6-C7. There is marked severity narrowing of the anterior atlantoaxial articulation. Marked severity intervertebral disc space narrowing is also seen at C6-C7 and along the posterior aspects of the C3-C4 and C4-C5. Bilateral marked severity multilevel facet joint hypertrophy is noted. Upper chest: A stable, likely benign 4 mm noncalcified left upper lobe lung nodule is seen. Other: None. IMPRESSION: 1. No acute osseous abnormality of the cervical spine. 2. Marked severity multilevel degenerative changes, as described above. 3. Stable, stepwise anterolisthesis at the levels of C4-C5 and C5-C6. Electronically Signed   By: Virgina Norfolk M.D.   On: 08/09/2021 16:27    Procedures Procedures    Medications Ordered in ED Medications  sodium chloride 0.9 % bolus 500 mL (0 mLs Intravenous Stopped 08/09/21 1824)    Followed by  0.9 %   sodium chloride infusion (1,000 mLs Intravenous New Bag/Given 08/09/21 1753)  losartan (COZAAR) tablet 50 mg (50 mg Oral Given 08/09/21 1834)    ED Course/ Medical Decision Making/ A&P Clinical Course as of 08/09/21 Margit Banda Aug 09, 2021  1706 CT Cervical Spine Wo Contrast Head CT C-spine CT and chest x-ray images and radiology report reviewed.  No acute findings [JK]  9794 Basic metabolic panel Normal [JK]  1707 CBC with Differential(!) Anemia noted, improved compared to previous values [JK]  1837 Case discussed with Dr Flossie Buffy regarding admission [JK]    Clinical Course User Index [JK] Dorie Rank, MD                           Medical Decision Making Problems Addressed: Hypertension, unspecified type: acute illness or injury Syncope, unspecified syncope type: acute illness or injury that poses a threat to life or bodily functions  Amount and/or Complexity of Data Reviewed External Data Reviewed: notes.    Details: Cardiology office notes reviewed Labs: ordered. Decision-making details documented in ED Course. Radiology:  Decision-making details documented in ED Course.  Risk Prescription drug management. Decision regarding hospitalization.   Patient presented to the ED for evaluation of syncopal episode.  Patient has been having episodes of feeling lightheaded and dizzy.  She did have a syncopal episode a few days ago.  No syncope since then but she has had presyncopal symptoms.  Patient has been having episodes of diarrhea but no diarrhea here today.  No signs of significant dehydration.  She is not having any abdominal pain to suggest acute infection.  No neurologic deficits noted on exam to suggest stroke TIA.  There are concerns the patient may not be taking her medications correctly.  Discussed with pharmacy and patient does have pill packs for some of her medications.  Tikosyn however may be on the bottle.  Concern is for possible cardiac dysrhythmia.  No signs of a at this point  in the ED and she has a normal rhythm right now but with her age recurrent symptoms I feel  she would benefit from overnight cardiac monitoring.  I will consult the medical service        Final Clinical Impression(s) / ED Diagnoses Final diagnoses:  Syncope, unspecified syncope type  Hypertension, unspecified type    Rx / DC Orders ED Discharge Orders     None         Dorie Rank, MD 08/09/21 Bosie Helper

## 2021-08-09 NOTE — Assessment & Plan Note (Addendum)
W/ nocturnal hypoxemia on 2L at bedtime Continue home bronchodilator

## 2021-08-09 NOTE — Telephone Encounter (Signed)
Patient called and said that patient has only been taking dofetilide (TIKOSYN) 125 MCG capsule once a day for a month. Started back twice a day starting yesterday. Patients HR has been 52. Patient has been feeling dizzy and falling along with diarrhea

## 2021-08-09 NOTE — Telephone Encounter (Signed)
Attempted to return Betty's phone call, it is going to voicemail every time without ringing.  Left message to call back.

## 2021-08-09 NOTE — Telephone Encounter (Signed)
Attempted to contact patient and caregiver. Left return call message for caregiver. Unable to leave VM on patient's phone.

## 2021-08-09 NOTE — ED Triage Notes (Signed)
Patient unable to really tell me what has brought her here.  Reading notes from today in chart patient has potentially mixed up her diltazem and tikosyn.  Patient has been having dizzy spells and had syncopal episode today in bathroom and woke up beside the tub after unknown amount of time. Patient has hx of afib and copd.

## 2021-08-09 NOTE — H&P (Signed)
History and Physical    Patient: Amanda Palmer ATF:[id number] DOB: [birth year] DOA: 08/09/2021 DOS: the patient was seen and examined on 08/09/2021 PCP: Celene Squibb, MD  Patient coming from: Home  Chief Complaint:  Chief Complaint  Patient presents with   Irregular Heart Beat   HPI: Amanda Palmer is a 88 y.o. female with medical history significant of permanent A-fib on diltiazem, Tikosyn and Eliquis, CVA, hypertension, COPD, GERD and remote history of breast cancer who presents with symptoms of dizziness and lightheadedness.  She has chronic dizziness issues but has been worse for the past few weeks. She has noticed dizziness with room spinning sensation when she first wakes up and gets out of bed or when she gets up from a chair.  Symptoms improved at rest or with lying down.  She denies any associated nausea or vomiting.  Although has been having diarrhea for 3 weeks with about 2 episodes daily. Has mild diffuse abdominal pain. Denies any melena or bright red blood.  Thinks it might be due to her medications but denies any new medications other than taking Imodium which has helped.  Niece at bedside helps her with setting up her medicine and thinks she was taking her Tikosyn once instead of twice a day.  Had a fall about 2 days ago where she was standing in the bathroom sink and thinks she flew in the air and landed in the bathtub.  Family thinks she was down for about 3 hours until her nephew found her.  She is unsure whether she had loss of consciousness.  Denies any chest pain, shortness of breath.  In the ED, she was afebrile and hypertensive up to 190/75 on room air.  No leukocytosis.  Hemoglobin of 10.6 with baseline around 8-9.  BMP unremarkable. Chest x-ray is negative.  CT head is negative. CT C-spine shows multilevel degenerative changes and anterolisthesis in certain cervical segments.  EKG was reassuring in normal sinus rhythm, left axis deviation with QTc around 424. Review  of Systems: As mentioned in the history of present illness. All other systems reviewed and are negative. Past Medical History:  Diagnosis Date   A-fib ([place])    Anxiety    Cancer ([place])    breast   COPD (chronic obstructive pulmonary disease) (HCC)    Depression    Hypertension    TIA (transient ischemic attack)    remote   Past Surgical History:  Procedure Laterality Date   ABDOMINAL HYSTERECTOMY     APPENDECTOMY     BACK SURGERY     total of five surgeries   BREAST BIOPSY Left    benign   BREAST CAPSULECTOMY WITH IMPLANT EXCHANGE Right 05/27/2016   Procedure: REMOVAL AND REPLACEMENT OF RIGHT BREAST IMPLANT AND BREAST CAPSULECTOMY;  Surgeon: Cristine Polio, MD;  Location: [facility];  Service: Plastics;  Laterality: Right;   CARDIAC CATHETERIZATION     CARDIOVERSION N/A 05/17/2020   Procedure: CARDIOVERSION;  Surgeon: Acie Fredrickson Wonda Cheng, MD;  Location: [facility] ENDOSCOPY;  Service: Cardiovascular;  Laterality: N/A;   CHOLECYSTECTOMY     COLONOSCOPY  08/2014   Dr. Britta Mccreedy: Small sessile polyp at the cecum, removed, tubular adenoma   ESOPHAGOGASTRODUODENOSCOPY  06/2013   Dr. Britta Mccreedy: Hiatal hernia, Schatzki ring, nonobstructive.   MASTECTOMY     right    ORIF ANKLE FRACTURE Left 06/01/2014   Procedure: OPEN REDUCTION INTERNAL FIXATION (ORIF) LEFT ANKLE FRACTURE;  Surgeon: Marybelle Killings, MD;  Location: [facility];  Service: Orthopedics;  Laterality: Left;   ROTATOR CUFF REPAIR     left   TEE WITHOUT CARDIOVERSION N/A 05/17/2020   Procedure: TRANSESOPHAGEAL ECHOCARDIOGRAM (TEE);  Surgeon: Acie Fredrickson Wonda Cheng, MD;  Location: [place];  Service: Cardiovascular;  Laterality: N/A;   TOTAL HIP ARTHROPLASTY     right   TOTAL KNEE ARTHROPLASTY     bilateral   Social History:  reports that she has never smoked. She has never used smokeless tobacco. She reports that she does not drink alcohol and does not use drugs.  Allergies  Allergen Reactions   Morphine And Related Nausea And  Vomiting   Penicillins Rash    Reaction: unknown   Sulfa Antibiotics Nausea And Vomiting    Family History  Problem Relation Age of Onset   Heart disease Brother    Heart disease Sister    Breast cancer Sister    Leukemia Brother    Breast cancer Sister    Breast cancer Other        one deceased, one living   Colon cancer Neg Hx     Prior to Admission medications   Medication Sig Start Date End Date Taking? Authorizing Provider  acetaminophen (TYLENOL) 500 MG tablet Take 500 mg by mouth every 6 (six) hours as needed for moderate pain.   Yes [provider]  acetic acid-hydrocortisone (VOSOL-HC) OTIC solution Place 2 drops into both ears at bedtime as needed (itching). 08/31/20  Yes [provider]  albuterol (PROVENTIL HFA;VENTOLIN HFA) 108 (90 Base) MCG/ACT inhaler Inhale 2 puffs into the lungs every 6 (six) hours as needed for wheezing or shortness of breath.   Yes [provider]  atorvastatin (LIPITOR) 20 MG tablet Take 20 mg by mouth at bedtime. 09/23/16  Yes [provider]  calcium carbonate (TUMS - DOSED IN MG ELEMENTAL CALCIUM) 500 MG chewable tablet Chew 500 mg by mouth daily.   Yes [provider]  clonazePAM (KLONOPIN) 1 MG tablet Take 1 tablet (1 mg total) by mouth at bedtime. 06/06/14  Yes Lauree Chandler, NP  diltiazem (CARDIZEM CD) 120 MG 24 hr capsule Take 1 capsule (120 mg total) by mouth daily. (long acting) 07/06/21  Yes Tillery, Satira Mccallum, PA-C  diltiazem (CARDIZEM) 30 MG tablet Take 1 tablet (30 mg total) by mouth every 8 (eight) hours as needed (for heartrate greater than 100). 06/30/20  Yes Fenton, Clint R, PA  dofetilide (TIKOSYN) 125 MCG capsule Take 1 capsule by mouth twice daily Patient taking differently: Take 125 mcg by mouth 2 (two) times daily. 02/26/21  Yes Allred, Jeneen Rinks, MD  ELIQUIS 5 MG TABS tablet Take 1 tablet by mouth twice daily Patient taking differently: Take 5 mg by mouth 2 (two) times daily.  06/23/18  Yes Herminio Commons, MD  esomeprazole (NEXIUM) 20 MG capsule Take 20 mg by mouth every evening.   Yes [provider]  furosemide (LASIX) 20 MG tablet Take 1 tablet (20 mg total) by mouth as needed for edema (swelling). Patient taking differently: Take 20 mg by mouth daily as needed for edema (swelling). 01/10/21  Yes Branch, Alphonse Guild, MD  ibandronate (BONIVA) 150 MG tablet Take 150 mg by mouth every 30 (thirty) days. 04/13/20  Yes [provider]  levalbuterol (XOPENEX) 0.63 MG/3ML nebulizer solution Take 3 mLs (0.63 mg total) by nebulization every 6 (six) hours as needed for wheezing or shortness of breath. Patient taking differently: Take 0.63 mg by nebulization 2 (two) times daily. 06/04/21  Yes Chesley Mires, MD  losartan (COZAAR) 50 MG tablet Take 50 mg by mouth daily. 02/18/19  Yes [provider]  Magnesium Oxide 400 MG CAPS Take 1 capsule (400 mg total) by mouth daily. Patient taking differently: Take 400 mg by mouth every evening. 07/09/21  Yes Shirley Friar, PA-C  metoprolol succinate (TOPROL XL) 50 MG 24 hr tablet Take 1 tablet (50 mg total) by mouth daily. Take with or immediately following a meal. 05/24/20 08/09/21 Yes Fenton, Clint R, PA  Multiple Vitamin (MULTIVITAMIN WITH MINERALS) TABS tablet Take 1 tablet by mouth every evening.   Yes [provider]  OXYGEN Inhale 2 L into the lungs continuous.   Yes [provider]  potassium chloride (KLOR-CON) 10 MEQ tablet Take 2 tablets (20 mEq total) by mouth daily. 05/28/21  Yes Allred, Jeneen Rinks, MD  Probiotic Product (PROBIOTIC DAILY) CAPS Take 1 capsule by mouth daily.   Yes [provider]  SILTUSSIN SA 100 MG/5ML syrup Take 5 mLs by mouth every 4 (four) hours as needed for cough. 05/11/20  Yes [provider]  TRELEGY ELLIPTA 100-62.5-25 MCG/ACT AEPB INHALE 1 PUFF BY MOUTH INTO LUNGS DAILY Patient taking differently: Inhale 1 puff into the lungs at bedtime.  05/25/21  Yes Chesley Mires, MD  triamcinolone cream (KENALOG) 0.1 % Apply 1 application topically daily as needed (hemorrhoids).   Yes [provider]  venlafaxine (EFFEXOR) 75 MG tablet Take 75-150 mg by mouth 2 (two) times daily with a meal. Takes 150 mg in the morning and 50 mg at night   Yes [provider]  Respiratory Therapy Supplies (FLUTTER) DEVI 1 Device by Does not apply route in the morning, at noon, in the evening, and at bedtime. 04/27/19   Candee Furbish, MD    Physical Exam: Vitals:   08/09/21 1530 08/09/21 1715 08/09/21 1830 08/09/21 2000  BP:  (!) 199/75 (!) 183/67 (!) 180/72  Pulse:  80 83 82  Resp:  '17 20 19  '$ Temp:    97.8 F (36.6 C)  TempSrc:    Oral  SpO2:  100% 97% 96%  Weight: 63.5 kg     Height: 5' (1.524 m)      Constitutional: NAD, calm, comfortable, thin elderly female laying flat in bed Eyes: lids and conjunctivae normal ENMT: Mucous membranes are moist. Neck: normal, supple Respiratory: clear to auscultation bilaterally, no wheezing, no crackles. Normal respiratory effort.   Cardiovascular: Regular rate and rhythm, no murmurs / rubs / gallops. No extremity edema.  Abdomen: Slightly tense, distended abdomen without tenderness, no masses palpated.  Bowel sounds positive.  Musculoskeletal: no clubbing / cyanosis. No joint deformity upper and lower extremities. Good ROM, no contractures. Normal muscle tone.  Skin: no rashes, lesions, ulcers.  Neurologic: CN 2-12 grossly intact.  Strength 5/5 in all 4.  Psychiatric: Normal judgment and insight. Alert and oriented x 3. Normal mood. Data Reviewed:  See HPI  Assessment and Plan: * Dizziness -Possible syncopal episode.  Patient had fall 2 days ago but unsure whether she had loss of consciousness. -Suspect her symptoms could be due to persistent diarrhea for the past 3 weeks although she has no electrolyte abnormalities or AKI to suggest overt dehydration. -Will check stool study and C.diff  test. Obtain Abdominal X-ray. -Last colonoscopy in 2016 with small sessile polyp in the cecum s/p polypectomy. Diarrhea likely function but consider GI consult outpatient if symptoms are persistent. -doubt this is secondary to her medications since she took  less of her Tikosyn and and not more.However will keep on continuous telemetry to assess for any arrhythmias. -Last echocardiogram on 09/2020 with EF of 60 to 65% and grade 1 diastolic dysfunction.  No significant valvular abnormalities. Will not repeat at this time with low suspicion for cardiac cause -check TSH  Persistent atrial fibrillation (HCC) Continue Diltiazem and Tioksyn -continue Eliquis  HTN (hypertension) Elevated. Continue home diltiazem,metoprolol and Losartan  COPD (chronic obstructive pulmonary disease) (HCC) W/ nocturnal hypoxemia on 2L at bedtime Continue home bronchodilator      Advance Care Planning:   Code Status: Full Code   Consults: None  Family Communication: Discussed with niece at bedside  Severity of Illness: The appropriate patient status for this patient is OBSERVATION. Observation status is judged to be reasonable and necessary in order to provide the required intensity of service to ensure the patient's safety. The patient's presenting symptoms, physical exam findings, and initial radiographic and laboratory data in the context of their medical condition is felt to place them at decreased risk for further clinical deterioration. Furthermore, it is anticipated that the patient will be medically stable for discharge from the hospital within 2 midnights of admission.   Author: Orene Desanctis, DO 08/09/2021 8:33 PM  For on call review [email or web address].

## 2021-08-09 NOTE — Assessment & Plan Note (Signed)
Elevated. Continue home diltiazem,metoprolol and Losartan

## 2021-08-09 NOTE — Telephone Encounter (Signed)
Inez Catalina, pts niece, calling back again. States EMS can't bring her to [place]. She is requesting to know where to take her to. She is there to pick her up.

## 2021-08-09 NOTE — ED Provider Triage Note (Addendum)
Emergency Medicine Provider Triage Evaluation Note  Amanda Palmer , a 88 y.o. female  was evaluated in triage.  Pt complains of fall, medication mixup, irregular heartbeat.  Patient has history of A-fib, COPD.  Patient apparently has been taking all medications.  The patient cannot provide much information as to why she is here.  On chart review, it appears that the patient is been mixing up her meds.  Patient also states that she fell on Tuesday.  Unsure if she lost consciousness or how far along she was down for.  Patient does have a history of A-fib and is on blood thinners.  Patient denies chest pain however does endorse shortness of breath.   Review of Systems  Positive:  Negative:   Physical Exam  BP (!) 175/74 (BP Location: Left Arm)   Pulse 64   Temp 98.6 F (37 C) (Oral)   Resp 18   Ht 5' (1.524 m)   Wt 63.5 kg   SpO2 97%   BMI 27.34 kg/m  Gen:   Awake, no distress   Resp:  Normal effort  MSK:   Moves extremities without difficulty  Other:    Medical Decision Making  Medically screening exam initiated at 3:41 PM.  Appropriate orders placed.  JULL HARRAL was informed that the remainder of the evaluation will be completed by another provider, this initial triage assessment does not replace that evaluation, and the importance of remaining in the ED until their evaluation is complete.         Azucena Cecil, PA-C 08/09/21 1542

## 2021-08-09 NOTE — Progress Notes (Signed)
This nurse ready the SBAR prior to shift change and turned it Green ready to accept patient prior to shift change; no verbal report given.

## 2021-08-09 NOTE — Telephone Encounter (Signed)
Spoke with the patient multiple times.  Not able to get in touch with [facility]. The patient has possibly mixed up her diltiazem and dofetilide medication. It is unclear after speaking with her what she has taken or not taken and for how long she has been doing this. 08/08/21 she was in the bathroom at the sink and she woke up at the tub for an unknown amount of time after falling and not knowing what happen. Because of the dofetilide component and risk of cardiac arrhythmia. We have called EMS to take her to be evaluated since we are unable to contact any family at this time. The patient is aware and verbalized understanding.

## 2021-08-09 NOTE — Assessment & Plan Note (Addendum)
Continue Diltiazem and Tioksyn -continue Eliquis

## 2021-08-09 NOTE — Assessment & Plan Note (Addendum)
-  Possible syncopal episode.  Patient had fall 2 days ago but unsure whether she had loss of consciousness. -Suspect her symptoms could be due to persistent diarrhea for the past 3 weeks although she has no electrolyte abnormalities or AKI to suggest overt dehydration. -Will check stool study and C.diff test. Obtain Abdominal X-ray. -Last colonoscopy in 2016 with small sessile polyp in the cecum s/p polypectomy. Diarrhea likely function but consider GI consult outpatient if symptoms are persistent. -doubt this is secondary to her medications since she took less of her Tikosyn and and not more.However will keep on continuous telemetry to assess for any arrhythmias. -Last echocardiogram on 09/2020 with EF of 60 to 65% and grade 1 diastolic dysfunction.  No significant valvular abnormalities. Will not repeat at this time with low suspicion for cardiac cause -check TSH

## 2021-08-09 NOTE — Telephone Encounter (Signed)
Left message for niece to call back or take her to [facility] which is where the patient said she was going earlier today.

## 2021-08-09 NOTE — ED Notes (Signed)
This RN noticed that report was already given by previous RN, this RN called [facility] to make sure they were ready to receive pt. Per [facility] they are ready, this RN put in for transfer.

## 2021-08-09 NOTE — Telephone Encounter (Signed)
Pt returning call. Requesting call back on [phone]

## 2021-08-09 NOTE — Telephone Encounter (Signed)
Niece calling back upset stating that no once has called her back. Needing to know if she needs to take the patient to ER.

## 2021-08-09 NOTE — Telephone Encounter (Signed)
Left message to call back on both phone numbers listed.

## 2021-08-10 ENCOUNTER — Observation Stay (HOSPITAL_COMMUNITY): Payer: Medicare HMO

## 2021-08-10 DIAGNOSIS — R55 Syncope and collapse: Secondary | ICD-10-CM | POA: Diagnosis not present

## 2021-08-10 DIAGNOSIS — G319 Degenerative disease of nervous system, unspecified: Secondary | ICD-10-CM | POA: Diagnosis not present

## 2021-08-10 DIAGNOSIS — I10 Essential (primary) hypertension: Secondary | ICD-10-CM | POA: Diagnosis not present

## 2021-08-10 DIAGNOSIS — R42 Dizziness and giddiness: Secondary | ICD-10-CM | POA: Diagnosis not present

## 2021-08-10 LAB — DIFFERENTIAL
Abs Immature Granulocytes: 0 10*3/uL (ref 0.00–0.07)
Basophils Absolute: 0 10*3/uL (ref 0.0–0.1)
Basophils Relative: 1 %
Eosinophils Absolute: 0.1 10*3/uL (ref 0.0–0.5)
Eosinophils Relative: 5 %
Immature Granulocytes: 0 %
Lymphocytes Relative: 23 %
Lymphs Abs: 0.7 10*3/uL (ref 0.7–4.0)
Monocytes Absolute: 0.3 10*3/uL (ref 0.1–1.0)
Monocytes Relative: 10 %
Neutro Abs: 1.7 10*3/uL (ref 1.7–7.7)
Neutrophils Relative %: 61 %

## 2021-08-10 LAB — CBC
HCT: 27.2 % — ABNORMAL LOW (ref 36.0–46.0)
Hemoglobin: 8.4 g/dL — ABNORMAL LOW (ref 12.0–15.0)
MCH: 30.2 pg (ref 26.0–34.0)
MCHC: 30.9 g/dL (ref 30.0–36.0)
MCV: 97.8 fL (ref 80.0–100.0)
Platelets: 148 10*3/uL — ABNORMAL LOW (ref 150–400)
RBC: 2.78 MIL/uL — ABNORMAL LOW (ref 3.87–5.11)
RDW: 13.3 % (ref 11.5–15.5)
WBC: 2.8 10*3/uL — ABNORMAL LOW (ref 4.0–10.5)
nRBC: 0 % (ref 0.0–0.2)

## 2021-08-10 LAB — TSH: TSH: 2.093 u[IU]/mL (ref 0.350–4.500)

## 2021-08-10 MED ORDER — LORAZEPAM 2 MG/ML IJ SOLN
0.5000 mg | Freq: Once | INTRAMUSCULAR | Status: AC | PRN
  Administered 2021-08-10: 0.5 mg via INTRAVENOUS
  Filled 2021-08-10: qty 1

## 2021-08-10 MED ORDER — ORAL CARE MOUTH RINSE
15.0000 mL | Freq: Two times a day (BID) | OROMUCOSAL | Status: DC
  Administered 2021-08-10 – 2021-08-11 (×3): 15 mL via OROMUCOSAL

## 2021-08-10 MED ORDER — HYDRALAZINE HCL 25 MG PO TABS
25.0000 mg | ORAL_TABLET | Freq: Four times a day (QID) | ORAL | Status: DC | PRN
  Administered 2021-08-10: 25 mg via ORAL
  Filled 2021-08-10: qty 1

## 2021-08-10 NOTE — Plan of Care (Signed)

## 2021-08-10 NOTE — Evaluation (Signed)
Physical Therapy Evaluation Patient Details Name: Amanda Palmer MRN: [id number] DOB: [birth year] Today's Date: 08/10/2021  History of Present Illness  Amanda Palmer is a 88 y.o. female admitted 6/1 presents with symptoms of dizziness and lightheadedness.  She reported diarrhea for last few weeks and reports a fall 2 days before admit.  CT head is negative.  PMH:  permanent A-fib on diltiazem, Tikosyn and Eliquis, CVA, hypertension, COPD, GERD and remote history of breast cancer  Clinical Impression  Pt admitted with above diagnosis. Pt was able to ambulate with RW but even with RW needing min assist and cues for safety as she has difficulty with transitional movements (not doing appropriate hand placement unless cued)  and keeping rW with her at all times.  Pt struggles when she doesn't have UE support and could not stand at EOB without use of RW.   Pt also has a right vestibular hypofunction which incr her dizziness with challenging environments.  Initiated x 1 exercises to address vertigo.  Given that pt has multiple safety factors and has fallen multiple times in last 2 months, recommmend SNF with vestibular rehab prior to d/c home.  Will follow acutely. Pt currently with functional limitations due to the deficits listed below (see PT Problem List). Pt will benefit from skilled PT to increase their independence and safety with mobility to allow discharge to the venue listed below.          Recommendations for follow up therapy are one component of a multi-disciplinary discharge planning process, led by the attending physician.  Recommendations may be updated based on patient status, additional functional criteria and insurance authorization.  Follow Up Recommendations Skilled nursing-short term rehab (<3 hours/day) (vestibular rehab)    Assistance Recommended at Discharge Set up Supervision/Assistance  Patient can return home with the following  A little help with walking and/or transfers;A  little help with bathing/dressing/bathroom;Help with stairs or ramp for entrance    Equipment Recommendations None recommended by PT  Recommendations for Other Services       Functional Status Assessment Patient has had a recent decline in their functional status and demonstrates the ability to make significant improvements in function in a reasonable and predictable amount of time.     Precautions / Restrictions Precautions Precautions: Fall Restrictions Weight Bearing Restrictions: No      Mobility  Bed Mobility Overal bed mobility: Independent             General bed mobility comments: incr time to come to eOB    Transfers Overall transfer level: Needs assistance Equipment used: Rolling walker (2 wheels) Transfers: Sit to/from Stand Sit to Stand: Min assist           General transfer comment: Pt needed min assist for sit to stand with RW as she tends to pull up on RW instead of using proper hand placement. Without RW, pt loses balance and cannot stand to her feet with out RW in front of her.    Ambulation/Gait Ambulation/Gait assistance: Min guard, Min assist Gait Distance (Feet): 250 Feet Assistive device: Rolling walker (2 wheels) Gait Pattern/deviations: Step-through pattern, Decreased stride length   Gait velocity interpretation: <1.31 ft/sec, indicative of household ambulator   General Gait Details: Pt ambulates fairly well with RW with overall steady gait.  However pt has issues with leaving RW and not staying close to it espeically with transitions thus significantly decreasing her safety. Pt and niece report that pt leaves her RW frequently at home  and then falls. Pt has also fallen off bed putting her socks on. Pt had trouble getting sock on today as well. Pt lives alone and clearly has had some safety issues at home.  Also assessed vestibular system and pt with positive right hypofunction which appears to impact her with transitions as well.  Stairs             Wheelchair Mobility    Modified Rankin (Stroke Patients Only)       Balance Overall balance assessment: Needs assistance Sitting-balance support: No upper extremity supported, Feet supported Sitting balance-Leahy Scale: Fair     Standing balance support: Bilateral upper extremity supported, No upper extremity supported, During functional activity Standing balance-Leahy Scale: Poor Standing balance comment: cannot stand without UE support and even needs assist with RW as well with challenges.                             Pertinent Vitals/Pain Pain Assessment Pain Assessment: No/denies pain    Home Living Family/patient expects to be discharged to:: Private residence Living Arrangements: Alone Available Help at Discharge: Family;Available PRN/intermittently Type of Home: Apartment Home Access: Elevator       Home Layout: One level Home Equipment: Conservation officer, nature (2 wheels);Rollator (4 wheels);Grab bars - tub/shower;Grab bars - toilet;Shower seat      Prior Function Prior Level of Function : Independent/Modified Independent;Driving             Mobility Comments: Pt used RW indoors and rollator outdoors ADLs Comments: Pt was independent but was having falls per pt and niece     Hand Dominance   Dominant Hand: Right    Extremity/Trunk Assessment   Upper Extremity Assessment Upper Extremity Assessment: Defer to OT evaluation    Lower Extremity Assessment Lower Extremity Assessment: Generalized weakness    Cervical / Trunk Assessment Cervical / Trunk Assessment: Kyphotic  Communication   Communication: No difficulties  Cognition Arousal/Alertness: Awake/alert Behavior During Therapy: WFL for tasks assessed/performed Overall Cognitive Status: Impaired/Different from baseline Area of Impairment: Following commands, Safety/judgement, Memory, Awareness, Problem solving                     Memory: Decreased short-term  memory Following Commands: Follows one step commands inconsistently Safety/Judgement: Decreased awareness of safety, Decreased awareness of deficits   Problem Solving: Decreased initiation, Difficulty sequencing, Requires verbal cues, Requires tactile cues General Comments: Pt with poor safety awareness.  Poor problem solving as well.        General Comments General comments (skin integrity, edema, etc.): VSS on RA with activity    Exercises Other Exercises Other Exercises: x1 exercises initiated for right vestibular hypofunction   Assessment/Plan    PT Assessment Patient needs continued PT services  PT Problem List Decreased activity tolerance;Decreased balance;Decreased mobility;Decreased knowledge of use of DME;Decreased safety awareness;Decreased knowledge of precautions       PT Treatment Interventions DME instruction;Gait training;Functional mobility training;Therapeutic activities;Therapeutic exercise;Balance training;Patient/family education    PT Goals (Current goals can be found in the Care Plan section)  Acute Rehab PT Goals Patient Stated Goal: to go home after rehab PT Goal Formulation: With patient Time For Goal Achievement: 08/24/21 Potential to Achieve Goals: Good    Frequency Min 3X/week     Co-evaluation               AM-PAC PT "6 Clicks" Mobility  Outcome Measure Help needed turning from your back  to your side while in a flat bed without using bedrails?: A Little Help needed moving from lying on your back to sitting on the side of a flat bed without using bedrails?: A Little Help needed moving to and from a bed to a chair (including a wheelchair)?: A Little Help needed standing up from a chair using your arms (e.g., wheelchair or bedside chair)?: A Little Help needed to walk in hospital room?: A Lot Help needed climbing 3-5 steps with a railing? : Total 6 Click Score: 15    End of Session Equipment Utilized During Treatment: Gait  belt;Oxygen Activity Tolerance: Patient limited by fatigue;Other (comment) (dizziness at times) Patient left: in bed;with call bell/phone within reach;with bed alarm set;with family/visitor present Nurse Communication: Mobility status PT Visit Diagnosis: Unsteadiness on feet (R26.81);Muscle weakness (generalized) (M62.81);Dizziness and giddiness (R42)    Time: 6789-3810 PT Time Calculation (min) (ACUTE ONLY): 52 min   Charges:   PT Evaluation $PT Eval High Complexity: 1 High PT Treatments $Gait Training: 8-22 mins $Therapeutic Exercise: 8-22 mins        Ximenna Fonseca M,PT Acute Rehab Services [phone] (pager)   Alvira Philips 08/10/2021, 4:40 PM

## 2021-08-10 NOTE — Plan of Care (Signed)
Discussed with patient plan of care for the evening, pain management and admission questions with some teach back displayed.  Patient stated she still lives home alone and niece comes over once a week to assist her.  She is still driving at this time.  Problem: Education: Goal: Knowledge of General Education information will improve Description: Including pain rating scale, medication(s)/side effects and non-pharmacologic comfort measures Outcome: Progressing

## 2021-08-10 NOTE — TOC Progression Note (Signed)
Transition of Care [facility]) - Progression Note    Patient Details  Name: CYSTAL SHANNAHAN MRN: [id number] Date of Birth: [birth year]  Transition of Care [facility]) CM/SW Contact  Angelita Ingles, RN Phone Number:[phone]  08/10/2021, 9:48 AM  Clinical Narrative:    TOC following 88 y.o. female with medical history significant of permanent A-fib on diltiazem, Tikosyn and Eliquis, CVA, hypertension, COPD, GERD and remote history of breast cancer who presents with symptoms of dizziness and lightheadedness. Patient has recent fall documented. TOC acknowledged that there may be disposition need. Will continue to follow.  Transition of Care [facility]) Screening Note   Patient Details  Name: FLORDIA KASSEM Date of Birth: Mar 08, 1934   Transition of Care [facility]) CM/SW Contact:    Angelita Ingles, RN Phone Number: 08/10/2021, 9:49 AM    Transition of Care Department [facility]) has reviewed patient and no TOC needs have been identified at this time. We will continue to monitor patient advancement through interdisciplinary progression rounds.           Expected Discharge Plan and Services                                                 Social Determinants of Health (SDOH) Interventions    Readmission Risk Interventions     View : No data to display.

## 2021-08-10 NOTE — Progress Notes (Addendum)
PROGRESS NOTE    Amanda Palmer  MWN:[id number] DOB: 05-25-1933 DOA: 08/09/2021 PCP: Celene Squibb, MD   Chief Complaint  Patient presents with   Irregular Heart Beat    Brief Narrative:   Amanda Palmer is a 88 y.o. female with medical history significant of permanent A-fib on diltiazem, Tikosyn and Eliquis, CVA, hypertension, COPD, GERD and remote history of breast cancer who presents with symptoms of dizziness and lightheadedness. -As well she does report diarrhea for last few weeks, no nausea, no vomiting, she reports a fall 2 days ago as well, CT head is negative, chest x-ray is negative, she was admitted for further work-up.   Assessment & Plan:   Principal Problem:   Dizziness Active Problems:   COPD (chronic obstructive pulmonary disease) (HCC)   HTN (hypertension)   Persistent atrial fibrillation (HCC)   Dizziness -Possible syncopal episode.  Related to volume depletion and dehydration from her chronic diarrhea. -Continue with telemetry monitoring.  Specially she is on Tikosyn -Consult vestibular PT. -Consult PT/OT. -She is not orthostatic. -Last colonoscopy in 2016 with small sessile polyp in the cecum s/p polypectomy. Diarrhea likely function but consider GI consult outpatient if symptoms are persistent. -Last echocardiogram on 09/2020 with EF of 60 to 65% and grade 1 diastolic dysfunction.  No significant valvular abnormalities. Will not repeat at this time with low suspicion for cardiac cause -TSH WNL -Patient appears to be with some vestibular abnormalities, vertigo, right-sided hypofunction, will obtain MRI brain to rule out acute CVA.   Persistent atrial fibrillation (HCC) Continue Diltiazem and Tioksyn -continue Eliquis   HTN (hypertension) Elevated. Continue home diltiazem,metoprolol and Losartan   COPD (chronic obstructive pulmonary disease) (HCC) W/ nocturnal hypoxemia on 2L at bedtime Continue home bronchodilator   Diarrhea -Follow on GI panel  and Hemoccult, but appears to have improved since admission  Weakness/deconditioning -Awaiting PT consult      DVT prophylaxis: Eliquis Code Status: Full Family Communication: none at bedside Disposition: Awaiting PT evaluation  Status is: Observation    Consultants:  None   Subjective:  No significant events overnight as discussed with staff, she reports her diarrhea has improved Objective: Vitals:   08/10/21 0602 08/10/21 0638 08/10/21 0741 08/10/21 1144  BP:  (!) 190/66 (!) 193/71 (!) 164/65  Pulse: 67 60 66 63  Resp: '20 18 18 14  '$ Temp: (!) 97.3 F (36.3 C)  97.8 F (36.6 C) 98 F (36.7 C)  TempSrc: Oral  Oral Oral  SpO2: 100% 100% 99% 100%  Weight:      Height:        Intake/Output Summary (Last 24 hours) at 08/10/2021 1330 Last data filed at 08/10/2021 0600 Gross per 24 hour  Intake 989.09 ml  Output 500 ml  Net 489.09 ml   Filed Weights   08/09/21 1530 08/09/21 2037  Weight: 63.5 kg 63.2 kg    Examination:  General exam: Appears calm and comfortable  Respiratory system: Clear to auscultation. Respiratory effort normal. Cardiovascular system: S1 & S2 heard, RRR. No JVD, murmurs, rubs, gallops or clicks. No pedal edema. Gastrointestinal system: Abdomen is nondistended, soft and nontender. No organomegaly or masses felt. Normal bowel sounds heard. Central nervous system: Alert and oriented. No focal neurological deficits. Extremities: Symmetric 5 x 5 power. Skin: No rashes, lesions or ulcers Psychiatry: Judgement and insight appear normal. Mood & affect appropriate.     Data Reviewed: I have personally reviewed following labs and imaging studies  CBC: Recent Labs  Lab  08/09/21 1548 08/10/21 0259  WBC 5.4 2.8*  NEUTROABS 4.2 1.7  HGB 10.6* 8.4*  HCT 33.8* 27.2*  MCV 97.7 97.8  PLT 198 148*    Basic Metabolic Panel: Recent Labs  Lab 08/09/21 1548 08/09/21 1723  NA 138  --   K 4.2  --   CL 104  --   CO2 29  --   GLUCOSE 79  --   BUN  12  --   CREATININE 0.87  --   CALCIUM 9.3  --   MG  --  2.2    GFR: Estimated Creatinine Clearance: 39.3 mL/min (by C-G formula based on SCr of 0.87 mg/dL).  Liver Function Tests: No results for input(s): AST, ALT, ALKPHOS, BILITOT, PROT, ALBUMIN in the last 168 hours.  CBG: No results for input(s): GLUCAP in the last 168 hours.   No results found for this or any previous visit (from the past 240 hour(s)).       Radiology Studies: DG Chest 2 View  Result Date: 08/09/2021 CLINICAL DATA:  Provided history: Shortness of breath. Additional history provided: Syncopal episode today. History of atrial fibrillation, COPD, breast cancer, hypertension, TIA. EXAM: CHEST - 2 VIEW COMPARISON:  Prior chest radiographs 12/13/2019 and earlier. FINDINGS: Heart size within normal limits. No appreciable airspace consolidation. No evidence of pleural effusion or pneumothorax. No acute bony abnormality identified. Dextrocurvature of the thoracic and visualized upper lumbar spine. Thoracolumbar spondylosis. IMPRESSION: No evidence of acute cardiopulmonary abnormality. Aortic Atherosclerosis (ICD10-I70.0). Thoracolumbar spondylosis and dextrocurvature. Electronically Signed   By: Kellie Simmering D.O.   On: 08/09/2021 16:10   CT Head Wo Contrast  Result Date: 08/09/2021 CLINICAL DATA:  Status post trauma. EXAM: CT HEAD WITHOUT CONTRAST TECHNIQUE: Contiguous axial images were obtained from the base of the skull through the vertex without intravenous contrast. RADIATION DOSE REDUCTION: This exam was performed according to the departmental dose-optimization program which includes automated exposure control, adjustment of the mA and/or kV according to patient size and/or use of iterative reconstruction technique. COMPARISON:  None Available. FINDINGS: Brain: There is mild cerebral atrophy with widening of the extra-axial spaces and ventricular dilatation. There are areas of decreased attenuation within the white matter  tracts of the supratentorial brain, consistent with microvascular disease changes. Vascular: No hyperdense vessel or unexpected calcification. Skull: Normal. Negative for fracture or focal lesion. Sinuses/Orbits: There is mild bilateral ethmoid sinus mucosal thickening. Other: None. IMPRESSION: 1. No acute intracranial abnormality. 2. Generalized cerebral atrophy with chronic white matter small vessel ischemic changes. Electronically Signed   By: Virgina Norfolk M.D.   On: 08/09/2021 16:22   CT Cervical Spine Wo Contrast  Result Date: 08/09/2021 CLINICAL DATA:  Status post trauma. EXAM: CT CERVICAL SPINE WITHOUT CONTRAST TECHNIQUE: Multidetector CT imaging of the cervical spine was performed without intravenous contrast. Multiplanar CT image reconstructions were also generated. RADIATION DOSE REDUCTION: This exam was performed according to the departmental dose-optimization program which includes automated exposure control, adjustment of the mA and/or kV according to patient size and/or use of iterative reconstruction technique. COMPARISON:  May 31, 2014 FINDINGS: Alignment: Stepwise, approximately 2 mm anterolisthesis is seen at the levels of C4-C5 and C5-C6. Skull base and vertebrae: No acute fracture. Chronic and degenerative changes are seen involving the body and tip of the dens. No primary bone lesion or focal pathologic process. Soft tissues and spinal canal: No prevertebral fluid or swelling. No visible canal hematoma. Disc levels: Marked severity endplate sclerosis and mild anterior osteophyte formation are  seen at the level of C6-C7. There is marked severity narrowing of the anterior atlantoaxial articulation. Marked severity intervertebral disc space narrowing is also seen at C6-C7 and along the posterior aspects of the C3-C4 and C4-C5. Bilateral marked severity multilevel facet joint hypertrophy is noted. Upper chest: A stable, likely benign 4 mm noncalcified left upper lobe lung nodule is seen.  Other: None. IMPRESSION: 1. No acute osseous abnormality of the cervical spine. 2. Marked severity multilevel degenerative changes, as described above. 3. Stable, stepwise anterolisthesis at the levels of C4-C5 and C5-C6. Electronically Signed   By: Virgina Norfolk M.D.   On: 08/09/2021 16:27   DG Abd Portable 1V  Result Date: 08/09/2021 CLINICAL DATA:  Abdominal pain EXAM: PORTABLE ABDOMEN - 1 VIEW COMPARISON:  08/08/2021 FINDINGS: Supine frontal view of the abdomen and pelvis demonstrates an unremarkable bowel gas pattern. No obstruction or ileus. No masses or abnormal calcifications. Stable postsurgical changes of the lumbar spine and right hip. IMPRESSION: 1. Unremarkable bowel gas pattern. Electronically Signed   By: Randa Ngo M.D.   On: 08/09/2021 21:33        Scheduled Meds:  apixaban  5 mg Oral BID   atorvastatin  20 mg Oral QHS   clonazePAM  1 mg Oral QHS   diltiazem  120 mg Oral Daily   dofetilide  125 mcg Oral BID   fluticasone furoate-vilanterol  1 puff Inhalation Daily   And   umeclidinium bromide  1 puff Inhalation Daily   losartan  50 mg Oral Daily   mouth rinse  15 mL Mouth Rinse BID   metoprolol succinate  50 mg Oral Daily   pantoprazole  40 mg Oral Daily   venlafaxine  75-150 mg Oral BID WC   Continuous Infusions:   LOS: 0 days      Phillips Climes, MD [facility]   To contact the attending provider between 7A-7P or the covering provider during after hours 7P-7A, please log into the web site [email or web address] and access using universal [place] password for that web site. If you do not have the password, please call the hospital operator.  08/10/2021, 1:30 PM

## 2021-08-11 DIAGNOSIS — Z7901 Long term (current) use of anticoagulants: Secondary | ICD-10-CM | POA: Diagnosis not present

## 2021-08-11 DIAGNOSIS — J42 Unspecified chronic bronchitis: Secondary | ICD-10-CM | POA: Diagnosis not present

## 2021-08-11 DIAGNOSIS — I10 Essential (primary) hypertension: Secondary | ICD-10-CM | POA: Diagnosis not present

## 2021-08-11 DIAGNOSIS — J449 Chronic obstructive pulmonary disease, unspecified: Secondary | ICD-10-CM | POA: Diagnosis not present

## 2021-08-11 DIAGNOSIS — I4821 Permanent atrial fibrillation: Secondary | ICD-10-CM | POA: Diagnosis not present

## 2021-08-11 DIAGNOSIS — Z853 Personal history of malignant neoplasm of breast: Secondary | ICD-10-CM | POA: Diagnosis not present

## 2021-08-11 DIAGNOSIS — Z96641 Presence of right artificial hip joint: Secondary | ICD-10-CM | POA: Diagnosis not present

## 2021-08-11 DIAGNOSIS — R42 Dizziness and giddiness: Secondary | ICD-10-CM | POA: Diagnosis not present

## 2021-08-11 DIAGNOSIS — R197 Diarrhea, unspecified: Secondary | ICD-10-CM | POA: Diagnosis not present

## 2021-08-11 DIAGNOSIS — Z79899 Other long term (current) drug therapy: Secondary | ICD-10-CM | POA: Diagnosis not present

## 2021-08-11 DIAGNOSIS — Z96653 Presence of artificial knee joint, bilateral: Secondary | ICD-10-CM | POA: Diagnosis not present

## 2021-08-11 DIAGNOSIS — Z8673 Personal history of transient ischemic attack (TIA), and cerebral infarction without residual deficits: Secondary | ICD-10-CM | POA: Diagnosis not present

## 2021-08-11 DIAGNOSIS — R531 Weakness: Secondary | ICD-10-CM | POA: Diagnosis not present

## 2021-08-11 LAB — BASIC METABOLIC PANEL
Anion gap: 3 — ABNORMAL LOW (ref 5–15)
BUN: 12 mg/dL (ref 8–23)
CO2: 27 mmol/L (ref 22–32)
Calcium: 8.8 mg/dL — ABNORMAL LOW (ref 8.9–10.3)
Chloride: 107 mmol/L (ref 98–111)
Creatinine, Ser: 0.83 mg/dL (ref 0.44–1.00)
GFR, Estimated: >60 mL/min (ref >60)
Glucose, Bld: 94 mg/dL (ref 70–99)
Potassium: 4.2 mmol/L (ref 3.5–5.1)
Sodium: 137 mmol/L (ref 135–145)

## 2021-08-11 LAB — CBC
HCT: 28.4 % — ABNORMAL LOW (ref 36.0–46.0)
Hemoglobin: 9.2 g/dL — ABNORMAL LOW (ref 12.0–15.0)
MCH: 31.2 pg (ref 26.0–34.0)
MCHC: 32.4 g/dL (ref 30.0–36.0)
MCV: 96.3 fL (ref 80.0–100.0)
Platelets: 169 10*3/uL (ref 150–400)
RBC: 2.95 MIL/uL — ABNORMAL LOW (ref 3.87–5.11)
RDW: 13.4 % (ref 11.5–15.5)
WBC: 4.6 10*3/uL (ref 4.0–10.5)
nRBC: 0 % (ref 0.0–0.2)

## 2021-08-11 NOTE — Discharge Instructions (Signed)
Follow with Primary MD Celene Squibb, MD in 7 days   Get CBC, CMP,  checked  by Primary MD next visit.    Activity: As tolerated with Full fall precautions use walker/cane & assistance as needed   Disposition Home    Diet: Heart Healthy   On your next visit with your primary care physician please Get Medicines reviewed and adjusted.   Please request your Prim.MD to go over all Hospital Tests and Procedure/Radiological results at the follow up, please get all Hospital records sent to your Prim MD by signing hospital release before you go home.   If you experience worsening of your admission symptoms, develop shortness of breath, life threatening emergency, suicidal or homicidal thoughts you must seek medical attention immediately by calling 911 or calling your MD immediately  if symptoms less severe.  You Must read complete instructions/literature along with all the possible adverse reactions/side effects for all the Medicines you take and that have been prescribed to you. Take any new Medicines after you have completely understood and accpet all the possible adverse reactions/side effects.   Do not drive, operating heavy machinery, perform activities at heights, swimming or participation in water activities or provide baby sitting services if your were admitted for syncope or siezures until you have seen by Primary MD or a Neurologist and advised to do so again.  Do not drive when taking Pain medications.    Do not take more than prescribed Pain, Sleep and Anxiety Medications  Special Instructions: If you have smoked or chewed Tobacco  in the last 2 yrs please stop smoking, stop any regular Alcohol  and or any Recreational drug use.  Wear Seat belts while driving.   Please note  You were cared for by a hospitalist during your hospital stay. If you have any questions about your discharge medications or the care you received while you were in the hospital after you are discharged,  you can call the unit and asked to speak with the hospitalist on call if the hospitalist that took care of you is not available. Once you are discharged, your primary care physician will handle any further medical issues. Please note that NO REFILLS for any discharge medications will be authorized once you are discharged, as it is imperative that you return to your primary care physician (or establish a relationship with a primary care physician if you do not have one) for your aftercare needs so that they can reassess your need for medications and monitor your lab values.

## 2021-08-11 NOTE — Discharge Summary (Signed)
Physician Discharge Summary  Amanda Palmer FKC:[id number] DOB: 06/24/1933 DOA: 08/09/2021  PCP: Celene Squibb, MD  Admit date: 08/09/2021 Discharge date: 08/11/2021  Admitted From:Home Disposition:  Home home health, patient declined subacute rehab.  Recommendations for Outpatient Follow-up:  Follow up with PCP in 1-2 weeks Please obtain BMP/CBC in one week Continue with PT/OT  Home Health:YES Equipment/Devices:has home oxygen   Discharge Condition:Stable CODE STATUS: FULL Diet recommendation: Heart Healthy  Brief/Interim Summary:    Amanda Palmer is a 88 y.o. female with medical history significant of permanent A-fib on diltiazem, Tikosyn and Eliquis, CVA, hypertension, COPD, GERD and remote history of breast cancer who presents with symptoms of dizziness and lightheadedness. -As well she does report diarrhea for last few weeks, no nausea, no vomiting, she reports a fall 2 days ago as well, CT head is negative, chest x-ray is negative, she was admitted for further work-up.    Dizziness -Patient presents with dizziness, questionable syncopal event, findings most likely related to some dehydration from her diarrhea , was admitted for further observation, no significant events on telemetry, she was not orthostatic, her work-up was significant mainly for  vertigo . -She was seen by PT/OT . -She is not orthostatic. -Last colonoscopy in 2016 with small sessile polyp in the cecum s/p polypectomy. Diarrhea likely function but consider GI consult outpatient if symptoms are persistent. -Last echocardiogram on 09/2020 with EF of 60 to 65% and grade 1 diastolic dysfunction.  No significant valvular abnormalities. Will not repeat at this time with low suspicion for cardiac cause -TSH WNL -MRI brain was obtained, with no evidence of any ischemic events.   Persistent atrial fibrillation (HCC) Continue Diltiazem and Tioksyn -continue Eliquis   HTN (hypertension) Continue with home regimen on  discharge   COPD (chronic obstructive pulmonary disease) (HCC)/chronic hypoxic respiratory failure W/ nocturnal hypoxemia on 2L at bedtime Continue home bronchodilator     Diarrhea -she was noted with no diarrhea during hospital stay, so could not send any stool studies - resolved   Weakness/deconditioning -Will arrange for home health.      Discharge Diagnoses:  Principal Problem:   Dizziness Active Problems:   COPD (chronic obstructive pulmonary disease) (HCC)   HTN (hypertension)   Persistent atrial fibrillation [facility])    Discharge Instructions  Discharge Instructions     Diet - low sodium heart healthy   Complete by: As directed    Discharge instructions   Complete by: As directed    Follow with Primary MD Celene Squibb, MD in 7 days   Get CBC, CMP,  checked  by Primary MD next visit.    Activity: As tolerated with Full fall precautions use walker/cane & assistance as needed   Disposition Home    Diet: Heart Healthy   On your next visit with your primary care physician please Get Medicines reviewed and adjusted.   Please request your Prim.MD to go over all Hospital Tests and Procedure/Radiological results at the follow up, please get all Hospital records sent to your Prim MD by signing hospital release before you go home.   If you experience worsening of your admission symptoms, develop shortness of breath, life threatening emergency, suicidal or homicidal thoughts you must seek medical attention immediately by calling 911 or calling your MD immediately  if symptoms less severe.  You Must read complete instructions/literature along with all the possible adverse reactions/side effects for all the Medicines you take and that have been prescribed to you. Take  any new Medicines after you have completely understood and accpet all the possible adverse reactions/side effects.   Do not drive, operating heavy machinery, perform activities at heights, swimming or  participation in water activities or provide baby sitting services if your were admitted for syncope or siezures until you have seen by Primary MD or a Neurologist and advised to do so again.  Do not drive when taking Pain medications.    Do not take more than prescribed Pain, Sleep and Anxiety Medications  Special Instructions: If you have smoked or chewed Tobacco  in the last 2 yrs please stop smoking, stop any regular Alcohol  and or any Recreational drug use.  Wear Seat belts while driving.   Please note  You were cared for by a hospitalist during your hospital stay. If you have any questions about your discharge medications or the care you received while you were in the hospital after you are discharged, you can call the unit and asked to speak with the hospitalist on call if the hospitalist that took care of you is not available. Once you are discharged, your primary care physician will handle any further medical issues. Please note that NO REFILLS for any discharge medications will be authorized once you are discharged, as it is imperative that you return to your primary care physician (or establish a relationship with a primary care physician if you do not have one) for your aftercare needs so that they can reassess your need for medications and monitor your lab values.   Increase activity slowly   Complete by: As directed       Allergies as of 08/11/2021       Reactions   Morphine And Related Nausea And Vomiting   Penicillins Rash   Reaction: unknown   Sulfa Antibiotics Nausea And Vomiting        Medication List     TAKE these medications    acetaminophen 500 MG tablet Commonly known as: TYLENOL Take 500 mg by mouth every 6 (six) hours as needed for moderate pain.   acetic acid-hydrocortisone OTIC solution Commonly known as: VOSOL-HC Place 2 drops into both ears at bedtime as needed (itching).   albuterol 108 (90 Base) MCG/ACT inhaler Commonly known as: VENTOLIN  HFA Inhale 2 puffs into the lungs every 6 (six) hours as needed for wheezing or shortness of breath.   atorvastatin 20 MG tablet Commonly known as: LIPITOR Take 20 mg by mouth at bedtime.   calcium carbonate 500 MG chewable tablet Commonly known as: TUMS - dosed in mg elemental calcium Chew 500 mg by mouth daily.   clonazePAM 1 MG tablet Commonly known as: KLONOPIN Take 1 tablet (1 mg total) by mouth at bedtime.   diltiazem 120 MG 24 hr capsule Commonly known as: CARDIZEM CD Take 1 capsule (120 mg total) by mouth daily. (long acting)   diltiazem 30 MG tablet Commonly known as: CARDIZEM Take 1 tablet (30 mg total) by mouth every 8 (eight) hours as needed (for heartrate greater than 100).   dofetilide 125 MCG capsule Commonly known as: TIKOSYN Take 1 capsule by mouth twice daily   Eliquis 5 MG Tabs tablet Generic drug: apixaban Take 1 tablet by mouth twice daily What changed: how much to take   esomeprazole 20 MG capsule Commonly known as: NEXIUM Take 20 mg by mouth every evening.   Flutter Devi 1 Device by Does not apply route in the morning, at noon, in the evening, and at bedtime.  furosemide 20 MG tablet Commonly known as: LASIX Take 1 tablet (20 mg total) by mouth as needed for edema (swelling). What changed: when to take this   ibandronate 150 MG tablet Commonly known as: BONIVA Take 150 mg by mouth every 30 (thirty) days.   levalbuterol 0.63 MG/3ML nebulizer solution Commonly known as: XOPENEX Take 3 mLs (0.63 mg total) by nebulization every 6 (six) hours as needed for wheezing or shortness of breath. What changed: when to take this   losartan 50 MG tablet Commonly known as: COZAAR Take 50 mg by mouth daily.   Magnesium Oxide 400 MG Caps Take 1 capsule (400 mg total) by mouth daily. What changed: when to take this   metoprolol succinate 50 MG 24 hr tablet Commonly known as: Toprol XL Take 1 tablet (50 mg total) by mouth daily. Take with or  immediately following a meal.   multivitamin with minerals Tabs tablet Take 1 tablet by mouth every evening.   OXYGEN Inhale 2 L into the lungs continuous.   potassium chloride 10 MEQ tablet Commonly known as: KLOR-CON Take 2 tablets (20 mEq total) by mouth daily.   Probiotic Daily Caps Take 1 capsule by mouth daily.   Siltussin SA 100 MG/5ML syrup Generic drug: guaifenesin Take 5 mLs by mouth every 4 (four) hours as needed for cough.   Trelegy Ellipta 100-62.5-25 MCG/ACT Aepb Generic drug: Fluticasone-Umeclidin-Vilant INHALE 1 PUFF BY MOUTH INTO LUNGS DAILY What changed: See the new instructions.   triamcinolone cream 0.1 % Commonly known as: KENALOG Apply 1 application topically daily as needed (hemorrhoids).   venlafaxine 75 MG tablet Commonly known as: EFFEXOR Take 75-150 mg by mouth 2 (two) times daily with a meal. Takes 150 mg in the morning and 50 mg at night        Allergies  Allergen Reactions   Morphine And Related Nausea And Vomiting   Penicillins Rash    Reaction: unknown   Sulfa Antibiotics Nausea And Vomiting    Consultations: None   Procedures/Studies: DG Chest 2 View  Result Date: 08/09/2021 CLINICAL DATA:  Provided history: Shortness of breath. Additional history provided: Syncopal episode today. History of atrial fibrillation, COPD, breast cancer, hypertension, TIA. EXAM: CHEST - 2 VIEW COMPARISON:  Prior chest radiographs 12/13/2019 and earlier. FINDINGS: Heart size within normal limits. No appreciable airspace consolidation. No evidence of pleural effusion or pneumothorax. No acute bony abnormality identified. Dextrocurvature of the thoracic and visualized upper lumbar spine. Thoracolumbar spondylosis. IMPRESSION: No evidence of acute cardiopulmonary abnormality. Aortic Atherosclerosis (ICD10-I70.0). Thoracolumbar spondylosis and dextrocurvature. Electronically Signed   By: Kellie Simmering D.O.   On: 08/09/2021 16:10   DG Abd 1 View  Result Date:  08/09/2021 CLINICAL DATA:  Abdominal pain, unspecified location. EXAM: ABDOMEN - 1 VIEW COMPARISON:  KUB abdomen 02/15/2015 FINDINGS: The bowel gas pattern is nonobstructive with mild stool retention ascending colon. No radio-opaque calculi are seen. There is no supine evidence of free air. Visceral shadows are stable as far as seen with the domes of the diaphragm excluded from the exam. Again noted are advanced degenerative changes in the lumbar spine with old L4-5 posterior fusion hardware and laminectomy, and a right hip arthroplasty. Osteopenia. IMPRESSION: No acute radiographic findings. Mild constipation. Osteopenia with degenerative and postsurgical change. Electronically Signed   By: Telford Nab M.D.   On: 08/09/2021 20:00   CT Head Wo Contrast  Result Date: 08/09/2021 CLINICAL DATA:  Status post trauma. EXAM: CT HEAD WITHOUT CONTRAST TECHNIQUE: Contiguous axial  images were obtained from the base of the skull through the vertex without intravenous contrast. RADIATION DOSE REDUCTION: This exam was performed according to the departmental dose-optimization program which includes automated exposure control, adjustment of the mA and/or kV according to patient size and/or use of iterative reconstruction technique. COMPARISON:  None Available. FINDINGS: Brain: There is mild cerebral atrophy with widening of the extra-axial spaces and ventricular dilatation. There are areas of decreased attenuation within the white matter tracts of the supratentorial brain, consistent with microvascular disease changes. Vascular: No hyperdense vessel or unexpected calcification. Skull: Normal. Negative for fracture or focal lesion. Sinuses/Orbits: There is mild bilateral ethmoid sinus mucosal thickening. Other: None. IMPRESSION: 1. No acute intracranial abnormality. 2. Generalized cerebral atrophy with chronic white matter small vessel ischemic changes. Electronically Signed   By: Virgina Norfolk M.D.   On: 08/09/2021 16:22    CT Cervical Spine Wo Contrast  Result Date: 08/09/2021 CLINICAL DATA:  Status post trauma. EXAM: CT CERVICAL SPINE WITHOUT CONTRAST TECHNIQUE: Multidetector CT imaging of the cervical spine was performed without intravenous contrast. Multiplanar CT image reconstructions were also generated. RADIATION DOSE REDUCTION: This exam was performed according to the departmental dose-optimization program which includes automated exposure control, adjustment of the mA and/or kV according to patient size and/or use of iterative reconstruction technique. COMPARISON:  May 31, 2014 FINDINGS: Alignment: Stepwise, approximately 2 mm anterolisthesis is seen at the levels of C4-C5 and C5-C6. Skull base and vertebrae: No acute fracture. Chronic and degenerative changes are seen involving the body and tip of the dens. No primary bone lesion or focal pathologic process. Soft tissues and spinal canal: No prevertebral fluid or swelling. No visible canal hematoma. Disc levels: Marked severity endplate sclerosis and mild anterior osteophyte formation are seen at the level of C6-C7. There is marked severity narrowing of the anterior atlantoaxial articulation. Marked severity intervertebral disc space narrowing is also seen at C6-C7 and along the posterior aspects of the C3-C4 and C4-C5. Bilateral marked severity multilevel facet joint hypertrophy is noted. Upper chest: A stable, likely benign 4 mm noncalcified left upper lobe lung nodule is seen. Other: None. IMPRESSION: 1. No acute osseous abnormality of the cervical spine. 2. Marked severity multilevel degenerative changes, as described above. 3. Stable, stepwise anterolisthesis at the levels of C4-C5 and C5-C6. Electronically Signed   By: Virgina Norfolk M.D.   On: 08/09/2021 16:27   MR BRAIN WO CONTRAST  Result Date: 08/10/2021 CLINICAL DATA:  Initial evaluation for acute vertigo, evaluate for stroke. EXAM: MRI HEAD WITHOUT CONTRAST TECHNIQUE: Multiplanar, multiecho pulse  sequences of the brain and surrounding structures were obtained without intravenous contrast. COMPARISON:  Prior CT from 08/09/2021. FINDINGS: Brain: Age-related cerebral atrophy. Patchy and confluent T2/FLAIR hyperintensity involving the periventricular and deep white matter both cerebral hemispheres as well as the pons, most consistent with chronic small vessel ischemic disease, moderately advanced in nature. No abnormal foci of restricted diffusion to suggest acute or subacute ischemia. Note made of apparent vague diffusion abnormality involving the midbrain on axial DWI sequence image 21, not seen on corresponding sequences, and favored to be artifactual. Gray-white matter differentiation maintained. No areas of chronic cortical infarction. No acute or chronic intracranial blood products. No mass lesion, midline shift or mass effect. Ventricles within normal limits without hydrocephalus. Slight asymmetry of the lateral ventricles with the left larger on the right noted, of doubtful significance. Pituitary gland suprasellar region normal. Midline structures intact and normal. Vascular: Major intracranial vascular flow voids are maintained. Skull  and upper cervical spine: Craniocervical junction within normal limits. Bone marrow signal intensity normal. No scalp soft tissue abnormality. Sinuses/Orbits: Prior bilateral ocular lens replacement. Paranasal sinuses are largely clear. Trace right mastoid effusion, of doubtful significance. Other: None. IMPRESSION: 1. No acute intracranial abnormality. 2. Age-related cerebral atrophy with moderately advanced chronic microvascular ischemic disease. Electronically Signed   By: Jeannine Boga M.D.   On: 08/10/2021 21:23   DG Abd Portable 1V  Result Date: 08/09/2021 CLINICAL DATA:  Abdominal pain EXAM: PORTABLE ABDOMEN - 1 VIEW COMPARISON:  08/08/2021 FINDINGS: Supine frontal view of the abdomen and pelvis demonstrates an unremarkable bowel gas pattern. No  obstruction or ileus. No masses or abnormal calcifications. Stable postsurgical changes of the lumbar spine and right hip. IMPRESSION: 1. Unremarkable bowel gas pattern. Electronically Signed   By: Randa Ngo M.D.   On: 08/09/2021 21:33      Subjective:  No significant events overnight, she denies any complaints today Discharge Exam: Vitals:   08/11/21 0726 08/11/21 0800  BP:  (!) 154/63  Pulse: 62 69  Resp: 18 18  Temp:  (!) 97.4 F (36.3 C)  SpO2: 99% 100%   Vitals:   08/10/21 2337 08/11/21 0447 08/11/21 0726 08/11/21 0800  BP: (!) 147/56 (!) 141/55  (!) 154/63  Pulse: 60 60 62 69  Resp: '20 18 18 18  '$ Temp: 97.6 F (36.4 C) 97.7 F (36.5 C)  (!) 97.4 F (36.3 C)  TempSrc: Oral Oral  Oral  SpO2: 100% 100% 99% 100%  Weight:      Height:        General: Pt is alert, awake, not in acute distress Cardiovascular: RRR, S1/S2 +, no rubs, no gallops Respiratory: CTA bilaterally, no wheezing, no rhonchi Abdominal: Soft, NT, ND, bowel sounds + Extremities: no edema, no cyanosis    The results of significant diagnostics from this hospitalization (including imaging, microbiology, ancillary and laboratory) are listed below for reference.     Microbiology: No results found for this or any previous visit (from the past 240 hour(s)).   Labs: BNP (last 3 results) No results for input(s): BNP in the last 8760 hours. Basic Metabolic Panel: Recent Labs  Lab 08/09/21 1548 08/09/21 1723 08/11/21 0129  NA 138  --  137  K 4.2  --  4.2  CL 104  --  107  CO2 29  --  27  GLUCOSE 79  --  94  BUN 12  --  12  CREATININE 0.87  --  0.83  CALCIUM 9.3  --  8.8*  MG  --  2.2  --    Liver Function Tests: No results for input(s): AST, ALT, ALKPHOS, BILITOT, PROT, ALBUMIN in the last 168 hours. No results for input(s): LIPASE, AMYLASE in the last 168 hours. No results for input(s): AMMONIA in the last 168 hours. CBC: Recent Labs  Lab 08/09/21 1548 08/10/21 0259 08/11/21 0129   WBC 5.4 2.8* 4.6  NEUTROABS 4.2 1.7  --   HGB 10.6* 8.4* 9.2*  HCT 33.8* 27.2* 28.4*  MCV 97.7 97.8 96.3  PLT 198 148* 169   Cardiac Enzymes: No results for input(s): CKTOTAL, CKMB, CKMBINDEX, TROPONINI in the last 168 hours. BNP: Invalid input(s): POCBNP CBG: No results for input(s): GLUCAP in the last 168 hours. D-Dimer No results for input(s): DDIMER in the last 72 hours. Hgb A1c No results for input(s): HGBA1C in the last 72 hours. Lipid Profile No results for input(s): CHOL, HDL, LDLCALC, TRIG, CHOLHDL, LDLDIRECT  in the last 72 hours. Thyroid function studies Recent Labs    08/10/21 0259  TSH 2.093   Anemia work up No results for input(s): VITAMINB12, FOLATE, FERRITIN, TIBC, IRON, RETICCTPCT in the last 72 hours. Urinalysis    Component Value Date/Time   COLORURINE YELLOW 05/31/2014 0222   APPEARANCEUR CLOUDY (A) 05/31/2014 0222   LABSPEC 1.023 05/31/2014 0222   PHURINE 5.5 05/31/2014 0222   GLUCOSEU NEGATIVE 05/31/2014 0222   HGBUR NEGATIVE 05/31/2014 0222   BILIRUBINUR SMALL (A) 05/31/2014 0222   KETONESUR 15 (A) 05/31/2014 0222   PROTEINUR NEGATIVE 05/31/2014 0222   UROBILINOGEN 1.0 05/31/2014 0222   NITRITE NEGATIVE 05/31/2014 0222   LEUKOCYTESUR NEGATIVE 05/31/2014 0222   Sepsis Labs Invalid input(s): PROCALCITONIN,  WBC,  LACTICIDVEN Microbiology No results found for this or any previous visit (from the past 240 hour(s)).   Time coordinating discharge: Over 30 minutes  SIGNED:   Phillips Climes, MD  [facility] 08/11/2021, 11:20 AM Pager   If 7PM-7AM, please contact night-coverage [email or web address]

## 2021-08-11 NOTE — Progress Notes (Signed)
Physical Therapy Treatment Patient Details Name: Amanda Palmer MRN: [id number] DOB: [birth year] Today's Date: 08/11/2021   History of Present Illness Amanda Palmer is a 88 y.o. female admitted 6/1 presents with symptoms of dizziness and lightheadedness.  She reported diarrhea for last few weeks and reports a fall 2 days before admit.  CT head is negative.  PMH:  permanent A-fib on diltiazem, Tikosyn and Eliquis, CVA, hypertension, COPD, GERD and remote history of breast cancer    PT Comments    Pt tolerates treatment well, ambulating without significant loss of balance today. Pt reports that she does not fall often and has not noticed consistently falling when abandoning walker, this conflicts with reports from family based on previous PT note. PT provides significant cueing to maintain body within walker frame at all times when mobilizing. PT also provides reinforcement of VOR exercise, pt performs well without symptoms. PT will continue to follow during this admission.   Recommendations for follow up therapy are one component of a multi-disciplinary discharge planning process, led by the attending physician.  Recommendations may be updated based on patient status, additional functional criteria and insurance authorization.  Follow Up Recommendations  Home health PT (pt refusing SNF, preferable to have a PT that is trained in vestibular rehab)     Assistance Recommended at Discharge Set up Supervision/Assistance  Patient can return home with the following A little help with walking and/or transfers;A little help with bathing/dressing/bathroom;Help with stairs or ramp for entrance   Equipment Recommendations  None recommended by PT    Recommendations for Other Services       Precautions / Restrictions Precautions Precautions: Fall Restrictions Weight Bearing Restrictions: No     Mobility  Bed Mobility Overal bed mobility: Independent                  Transfers Overall  transfer level: Needs assistance Equipment used: Rolling walker (2 wheels) Transfers: Sit to/from Stand Sit to Stand: Min guard           General transfer comment: verbal cues for increased trunk flexion and hand placement. Pt with multiple attempts to rise which were unsuccessful prior to cues    Ambulation/Gait Ambulation/Gait assistance: Min guard Gait Distance (Feet): 250 Feet Assistive device: Rolling walker (2 wheels) Gait Pattern/deviations: Step-through pattern Gait velocity: reduced Gait velocity interpretation: <1.31 ft/sec, indicative of household ambulator   General Gait Details: pt with steady step-through gait. PT providing cues to maintain walker closer to BOS, pt is very hard of hearing and having difficulty following cues   Stairs             Wheelchair Mobility    Modified Rankin (Stroke Patients Only)       Balance Overall balance assessment: Needs assistance Sitting-balance support: Feet supported, No upper extremity supported Sitting balance-Leahy Scale: Good     Standing balance support: Bilateral upper extremity supported, Reliant on assistive device for balance Standing balance-Leahy Scale: Poor                              Cognition Arousal/Alertness: Awake/alert Behavior During Therapy: WFL for tasks assessed/performed Overall Cognitive Status: Impaired/Different from baseline Area of Impairment: Safety/judgement                         Safety/Judgement: Decreased awareness of safety, Decreased awareness of deficits  Exercises Other Exercises Other Exercises: VOR x1 horizontal and vertical exercise, 30 seconds, pt reports no dizziness    General Comments General comments (skin integrity, edema, etc.): VSS on RA      Pertinent Vitals/Pain Pain Assessment Pain Assessment: No/denies pain    Home Living                          Prior Function            PT Goals (current  goals can now be found in the care plan section) Acute Rehab PT Goals Patient Stated Goal: to go home after rehab Progress towards PT goals: Progressing toward goals    Frequency    Min 3X/week      PT Plan Current plan remains appropriate    Co-evaluation              AM-PAC PT "6 Clicks" Mobility   Outcome Measure  Help needed turning from your back to your side while in a flat bed without using bedrails?: A Little Help needed moving from lying on your back to sitting on the side of a flat bed without using bedrails?: A Little Help needed moving to and from a bed to a chair (including a wheelchair)?: A Little Help needed standing up from a chair using your arms (e.g., wheelchair or bedside chair)?: A Little Help needed to walk in hospital room?: A Little Help needed climbing 3-5 steps with a railing? : Total 6 Click Score: 16    End of Session   Activity Tolerance: Patient tolerated treatment well Patient left: in bed;with call bell/phone within reach;with bed alarm set Nurse Communication: Mobility status PT Visit Diagnosis: Unsteadiness on feet (R26.81);Muscle weakness (generalized) (M62.81);Dizziness and giddiness (R42)     Time: 8341-9622 PT Time Calculation (min) (ACUTE ONLY): 34 min  Charges:  $Gait Training: 8-22 mins $Therapeutic Exercise: 8-22 mins                     Zenaida Niece, PT, DPT Acute Rehabilitation Office [place] [facility] 08/11/2021, 2:50 PM

## 2021-08-11 NOTE — TOC Transition Note (Signed)
Transition of Care [facility]) - CM/SW Discharge Note   Patient Details  Name: Amanda Palmer MRN: [id number] Date of Birth: 06/29/1933  Transition of Care [facility]) CM/SW Contact:  Konrad Penta, RN Phone Number: [phone] 08/11/2021, 12:22 PM   Clinical Narrative:   Made aware by MD that patient wants to go home and not SNF. Spoke with [name] at bedside. She is hard of hearing. Wears 2 hearing aides. Discussed transition plans. Lives alone in [address] apartments in [place]. States her niece Inez Catalina assists with transportation to MD appointments. [name] reports she still cooks. [name] endorses she has equipment at home - rolling walker, bedside commode, and home oxygen at night thru [employer]. Discussed home health agency. No preference.   Sharmon Revere with [facility] is able to accept referral for PT/OT/RN/aide services.   Spoke with niece [name] [phone] to discuss home health arrangements. Inez Catalina confirms she will pick [name] up today around 3 pm.   Please re-consult if TOC needed.     Final next level of care: [place] Barriers to Discharge: No Barriers Identified   Patient Goals and CMS Choice Patient states their goals for this hospitalization and ongoing recovery are:: return hom CMS Medicare.gov Compare Post Acute Care list provided to:: Patient Choice offered to / list presented to : Patient  Discharge Placement                       Discharge Plan and Services                          HH Arranged: RN, PT, OT. aide [facility] Agency: [place] Date [place]: 08/11/21 Time HH Agency Contacted: 1200 Representative spoke with at [place]: [place]) Interventions     Readmission Risk Interventions     View : No data to display.

## 2021-08-11 NOTE — Plan of Care (Signed)
  Problem: Education: Goal: Knowledge of General Education information will improve Description: Including pain rating scale, medication(s)/side effects and non-pharmacologic comfort measures Outcome: Completed/Met   Problem: Activity: Goal: Risk for activity intolerance will decrease Outcome: Completed/Met   Problem: Pain Managment: Goal: General experience of comfort will improve Outcome: Completed/Met   Problem: Safety: Goal: Ability to remain free from injury will improve Outcome: Completed/Met   Problem: Skin Integrity: Goal: Risk for impaired skin integrity will decrease Outcome: Completed/Met   Problem: Acute Rehab PT Goals(only PT should resolve) Goal: Patient Will Transfer Sit To/From Stand Outcome: Completed/Met Goal: Pt Will Ambulate Outcome: Completed/Met Goal: PT Additional Goal #1 Outcome: Completed/Met

## 2021-08-16 ENCOUNTER — Other Ambulatory Visit (HOSPITAL_COMMUNITY): Payer: Self-pay

## 2021-08-19 DIAGNOSIS — J449 Chronic obstructive pulmonary disease, unspecified: Secondary | ICD-10-CM | POA: Diagnosis not present

## 2021-08-21 DIAGNOSIS — R531 Weakness: Secondary | ICD-10-CM | POA: Diagnosis not present

## 2021-08-21 DIAGNOSIS — I4821 Permanent atrial fibrillation: Secondary | ICD-10-CM | POA: Diagnosis not present

## 2021-08-21 DIAGNOSIS — J449 Chronic obstructive pulmonary disease, unspecified: Secondary | ICD-10-CM | POA: Diagnosis not present

## 2021-08-21 DIAGNOSIS — I10 Essential (primary) hypertension: Secondary | ICD-10-CM | POA: Diagnosis not present

## 2021-08-21 DIAGNOSIS — Z8673 Personal history of transient ischemic attack (TIA), and cerebral infarction without residual deficits: Secondary | ICD-10-CM | POA: Diagnosis not present

## 2021-08-21 DIAGNOSIS — E860 Dehydration: Secondary | ICD-10-CM | POA: Diagnosis not present

## 2021-08-21 DIAGNOSIS — Z7901 Long term (current) use of anticoagulants: Secondary | ICD-10-CM | POA: Diagnosis not present

## 2021-08-21 DIAGNOSIS — R42 Dizziness and giddiness: Secondary | ICD-10-CM | POA: Diagnosis not present

## 2021-08-21 DIAGNOSIS — Z9181 History of falling: Secondary | ICD-10-CM | POA: Diagnosis not present

## 2021-08-21 DIAGNOSIS — Z9981 Dependence on supplemental oxygen: Secondary | ICD-10-CM | POA: Diagnosis not present

## 2021-08-21 DIAGNOSIS — K219 Gastro-esophageal reflux disease without esophagitis: Secondary | ICD-10-CM | POA: Diagnosis not present

## 2021-08-23 DIAGNOSIS — Z8673 Personal history of transient ischemic attack (TIA), and cerebral infarction without residual deficits: Secondary | ICD-10-CM | POA: Diagnosis not present

## 2021-08-23 DIAGNOSIS — I10 Essential (primary) hypertension: Secondary | ICD-10-CM | POA: Diagnosis not present

## 2021-08-23 DIAGNOSIS — E860 Dehydration: Secondary | ICD-10-CM | POA: Diagnosis not present

## 2021-08-23 DIAGNOSIS — R531 Weakness: Secondary | ICD-10-CM | POA: Diagnosis not present

## 2021-08-23 DIAGNOSIS — Z9181 History of falling: Secondary | ICD-10-CM | POA: Diagnosis not present

## 2021-08-23 DIAGNOSIS — R42 Dizziness and giddiness: Secondary | ICD-10-CM | POA: Diagnosis not present

## 2021-08-23 DIAGNOSIS — Z0189 Encounter for other specified special examinations: Secondary | ICD-10-CM | POA: Diagnosis not present

## 2021-08-23 DIAGNOSIS — I4821 Permanent atrial fibrillation: Secondary | ICD-10-CM | POA: Diagnosis not present

## 2021-08-23 DIAGNOSIS — R197 Diarrhea, unspecified: Secondary | ICD-10-CM | POA: Diagnosis not present

## 2021-08-23 DIAGNOSIS — Z7901 Long term (current) use of anticoagulants: Secondary | ICD-10-CM | POA: Diagnosis not present

## 2021-08-23 DIAGNOSIS — Z9981 Dependence on supplemental oxygen: Secondary | ICD-10-CM | POA: Diagnosis not present

## 2021-08-23 DIAGNOSIS — R0989 Other specified symptoms and signs involving the circulatory and respiratory systems: Secondary | ICD-10-CM | POA: Diagnosis not present

## 2021-08-23 DIAGNOSIS — K219 Gastro-esophageal reflux disease without esophagitis: Secondary | ICD-10-CM | POA: Diagnosis not present

## 2021-08-23 DIAGNOSIS — J449 Chronic obstructive pulmonary disease, unspecified: Secondary | ICD-10-CM | POA: Diagnosis not present

## 2021-08-23 DIAGNOSIS — K449 Diaphragmatic hernia without obstruction or gangrene: Secondary | ICD-10-CM | POA: Diagnosis not present

## 2021-08-29 DIAGNOSIS — Z8673 Personal history of transient ischemic attack (TIA), and cerebral infarction without residual deficits: Secondary | ICD-10-CM | POA: Diagnosis not present

## 2021-08-29 DIAGNOSIS — R531 Weakness: Secondary | ICD-10-CM | POA: Diagnosis not present

## 2021-08-29 DIAGNOSIS — Z9981 Dependence on supplemental oxygen: Secondary | ICD-10-CM | POA: Diagnosis not present

## 2021-08-29 DIAGNOSIS — J449 Chronic obstructive pulmonary disease, unspecified: Secondary | ICD-10-CM | POA: Diagnosis not present

## 2021-08-29 DIAGNOSIS — E860 Dehydration: Secondary | ICD-10-CM | POA: Diagnosis not present

## 2021-08-29 DIAGNOSIS — I4821 Permanent atrial fibrillation: Secondary | ICD-10-CM | POA: Diagnosis not present

## 2021-08-29 DIAGNOSIS — R42 Dizziness and giddiness: Secondary | ICD-10-CM | POA: Diagnosis not present

## 2021-08-29 DIAGNOSIS — K219 Gastro-esophageal reflux disease without esophagitis: Secondary | ICD-10-CM | POA: Diagnosis not present

## 2021-08-29 DIAGNOSIS — I10 Essential (primary) hypertension: Secondary | ICD-10-CM | POA: Diagnosis not present

## 2021-08-29 DIAGNOSIS — Z9181 History of falling: Secondary | ICD-10-CM | POA: Diagnosis not present

## 2021-08-29 DIAGNOSIS — Z7901 Long term (current) use of anticoagulants: Secondary | ICD-10-CM | POA: Diagnosis not present

## 2021-08-31 ENCOUNTER — Emergency Department (HOSPITAL_COMMUNITY)
Admission: EM | Admit: 2021-08-31 | Discharge: 2021-08-31 | Disposition: A | Discharge status: Home / Self Care | Payer: Medicare HMO | Attending: Emergency Medicine | Admitting: Emergency Medicine

## 2021-08-31 ENCOUNTER — Other Ambulatory Visit: Payer: Self-pay

## 2021-08-31 ENCOUNTER — Encounter (HOSPITAL_COMMUNITY): Payer: Self-pay

## 2021-08-31 DIAGNOSIS — Z5321 Procedure and treatment not carried out due to patient leaving prior to being seen by health care provider: Secondary | ICD-10-CM | POA: Insufficient documentation

## 2021-08-31 DIAGNOSIS — R197 Diarrhea, unspecified: Secondary | ICD-10-CM | POA: Diagnosis not present

## 2021-08-31 NOTE — ED Notes (Signed)
Patient states she feels better . Patient left the ED in no acute distress.

## 2021-09-03 DIAGNOSIS — K219 Gastro-esophageal reflux disease without esophagitis: Secondary | ICD-10-CM | POA: Diagnosis not present

## 2021-09-03 DIAGNOSIS — I4821 Permanent atrial fibrillation: Secondary | ICD-10-CM | POA: Diagnosis not present

## 2021-09-03 DIAGNOSIS — E860 Dehydration: Secondary | ICD-10-CM | POA: Diagnosis not present

## 2021-09-03 DIAGNOSIS — I10 Essential (primary) hypertension: Secondary | ICD-10-CM | POA: Diagnosis not present

## 2021-09-03 DIAGNOSIS — Z8673 Personal history of transient ischemic attack (TIA), and cerebral infarction without residual deficits: Secondary | ICD-10-CM | POA: Diagnosis not present

## 2021-09-03 DIAGNOSIS — R42 Dizziness and giddiness: Secondary | ICD-10-CM | POA: Diagnosis not present

## 2021-09-03 DIAGNOSIS — Z7901 Long term (current) use of anticoagulants: Secondary | ICD-10-CM | POA: Diagnosis not present

## 2021-09-03 DIAGNOSIS — Z9181 History of falling: Secondary | ICD-10-CM | POA: Diagnosis not present

## 2021-09-03 DIAGNOSIS — Z9981 Dependence on supplemental oxygen: Secondary | ICD-10-CM | POA: Diagnosis not present

## 2021-09-03 DIAGNOSIS — J449 Chronic obstructive pulmonary disease, unspecified: Secondary | ICD-10-CM | POA: Diagnosis not present

## 2021-09-03 DIAGNOSIS — R531 Weakness: Secondary | ICD-10-CM | POA: Diagnosis not present

## 2021-09-06 DIAGNOSIS — R531 Weakness: Secondary | ICD-10-CM | POA: Diagnosis not present

## 2021-09-06 DIAGNOSIS — J449 Chronic obstructive pulmonary disease, unspecified: Secondary | ICD-10-CM | POA: Diagnosis not present

## 2021-09-06 DIAGNOSIS — E860 Dehydration: Secondary | ICD-10-CM | POA: Diagnosis not present

## 2021-09-06 DIAGNOSIS — R42 Dizziness and giddiness: Secondary | ICD-10-CM | POA: Diagnosis not present

## 2021-09-06 DIAGNOSIS — Z9981 Dependence on supplemental oxygen: Secondary | ICD-10-CM | POA: Diagnosis not present

## 2021-09-06 DIAGNOSIS — I10 Essential (primary) hypertension: Secondary | ICD-10-CM | POA: Diagnosis not present

## 2021-09-06 DIAGNOSIS — I4821 Permanent atrial fibrillation: Secondary | ICD-10-CM | POA: Diagnosis not present

## 2021-09-06 DIAGNOSIS — Z9181 History of falling: Secondary | ICD-10-CM | POA: Diagnosis not present

## 2021-09-06 DIAGNOSIS — Z7901 Long term (current) use of anticoagulants: Secondary | ICD-10-CM | POA: Diagnosis not present

## 2021-09-06 DIAGNOSIS — K219 Gastro-esophageal reflux disease without esophagitis: Secondary | ICD-10-CM | POA: Diagnosis not present

## 2021-09-06 DIAGNOSIS — Z8673 Personal history of transient ischemic attack (TIA), and cerebral infarction without residual deficits: Secondary | ICD-10-CM | POA: Diagnosis not present

## 2021-09-07 DIAGNOSIS — I10 Essential (primary) hypertension: Secondary | ICD-10-CM | POA: Diagnosis not present

## 2021-09-07 DIAGNOSIS — I482 Chronic atrial fibrillation, unspecified: Secondary | ICD-10-CM | POA: Diagnosis not present

## 2021-09-07 DIAGNOSIS — J449 Chronic obstructive pulmonary disease, unspecified: Secondary | ICD-10-CM | POA: Diagnosis not present

## 2021-09-07 DIAGNOSIS — E1165 Type 2 diabetes mellitus with hyperglycemia: Secondary | ICD-10-CM | POA: Diagnosis not present

## 2021-09-12 ENCOUNTER — Telehealth: Payer: Self-pay | Admitting: Student

## 2021-09-12 MED ORDER — DOFETILIDE 125 MCG PO CAPS: 125.0000 ug | ORAL_CAPSULE | Freq: Two times a day (BID) | ORAL | 3 refills | Status: DC

## 2021-09-12 MED ORDER — POTASSIUM CHLORIDE ER 10 MEQ PO TBCR: 20.0000 meq | EXTENDED_RELEASE_TABLET | Freq: Every day | ORAL | 3 refills | Status: DC

## 2021-09-12 MED ORDER — DILTIAZEM HCL 30 MG PO TABS: 30.0000 mg | ORAL_TABLET | Freq: Three times a day (TID) | ORAL | 3 refills | Status: DC | PRN

## 2021-09-12 NOTE — Telephone Encounter (Signed)
*  STAT* If patient is at the pharmacy, call can be transferred to refill team.   1. Which medications need to be refilled? (please list name of each medication and dose if known) diltiazem (CARDIZEM) 30 MG tablet; dofetilide (TIKOSYN) 125 MCG capsule; potassium chloride (KLOR-CON) 10 MEQ tablet  2. Which pharmacy/location (including street and city if local pharmacy) is medication to be sent to? [facility] - [place], [place] - [place] [address]  3. Do they need a 30 day or 90 day supply? [place]

## 2021-09-12 NOTE — Telephone Encounter (Signed)
Pt's medications were sent to pt's pharmacy as requested. Confirmation received.  

## 2021-09-18 DIAGNOSIS — J449 Chronic obstructive pulmonary disease, unspecified: Secondary | ICD-10-CM | POA: Diagnosis not present

## 2021-09-25 DIAGNOSIS — M79671 Pain in right foot: Secondary | ICD-10-CM | POA: Diagnosis not present

## 2021-09-25 DIAGNOSIS — I739 Peripheral vascular disease, unspecified: Secondary | ICD-10-CM | POA: Diagnosis not present

## 2021-09-25 DIAGNOSIS — L110 Acquired keratosis follicularis: Secondary | ICD-10-CM | POA: Diagnosis not present

## 2021-09-25 DIAGNOSIS — M79672 Pain in left foot: Secondary | ICD-10-CM | POA: Diagnosis not present

## 2021-10-03 DIAGNOSIS — D649 Anemia, unspecified: Secondary | ICD-10-CM | POA: Diagnosis not present

## 2021-10-03 DIAGNOSIS — R7301 Impaired fasting glucose: Secondary | ICD-10-CM | POA: Diagnosis not present

## 2021-10-03 DIAGNOSIS — M818 Other osteoporosis without current pathological fracture: Secondary | ICD-10-CM | POA: Diagnosis not present

## 2021-10-03 DIAGNOSIS — E782 Mixed hyperlipidemia: Secondary | ICD-10-CM | POA: Diagnosis not present

## 2021-10-06 DIAGNOSIS — J449 Chronic obstructive pulmonary disease, unspecified: Secondary | ICD-10-CM | POA: Diagnosis not present

## 2021-10-08 DIAGNOSIS — J449 Chronic obstructive pulmonary disease, unspecified: Secondary | ICD-10-CM | POA: Diagnosis not present

## 2021-10-08 DIAGNOSIS — I10 Essential (primary) hypertension: Secondary | ICD-10-CM | POA: Diagnosis not present

## 2021-10-08 DIAGNOSIS — E1165 Type 2 diabetes mellitus with hyperglycemia: Secondary | ICD-10-CM | POA: Diagnosis not present

## 2021-10-08 DIAGNOSIS — I482 Chronic atrial fibrillation, unspecified: Secondary | ICD-10-CM | POA: Diagnosis not present

## 2021-10-15 ENCOUNTER — Other Ambulatory Visit (HOSPITAL_COMMUNITY): Payer: Self-pay | Admitting: Internal Medicine

## 2021-10-15 DIAGNOSIS — M818 Other osteoporosis without current pathological fracture: Secondary | ICD-10-CM

## 2021-10-15 DIAGNOSIS — Z1231 Encounter for screening mammogram for malignant neoplasm of breast: Secondary | ICD-10-CM

## 2021-10-18 ENCOUNTER — Encounter: Payer: Self-pay | Admitting: Internal Medicine

## 2021-10-18 DIAGNOSIS — E782 Mixed hyperlipidemia: Secondary | ICD-10-CM | POA: Diagnosis not present

## 2021-10-18 DIAGNOSIS — I739 Peripheral vascular disease, unspecified: Secondary | ICD-10-CM | POA: Diagnosis not present

## 2021-10-18 DIAGNOSIS — K219 Gastro-esophageal reflux disease without esophagitis: Secondary | ICD-10-CM | POA: Diagnosis not present

## 2021-10-18 DIAGNOSIS — Z23 Encounter for immunization: Secondary | ICD-10-CM | POA: Diagnosis not present

## 2021-10-18 DIAGNOSIS — R600 Localized edema: Secondary | ICD-10-CM | POA: Diagnosis not present

## 2021-10-18 DIAGNOSIS — D649 Anemia, unspecified: Secondary | ICD-10-CM | POA: Diagnosis not present

## 2021-10-18 DIAGNOSIS — I10 Essential (primary) hypertension: Secondary | ICD-10-CM | POA: Diagnosis not present

## 2021-10-18 DIAGNOSIS — I482 Chronic atrial fibrillation, unspecified: Secondary | ICD-10-CM | POA: Diagnosis not present

## 2021-10-18 DIAGNOSIS — R69 Illness, unspecified: Secondary | ICD-10-CM | POA: Diagnosis not present

## 2021-10-18 DIAGNOSIS — M818 Other osteoporosis without current pathological fracture: Secondary | ICD-10-CM | POA: Diagnosis not present

## 2021-10-18 DIAGNOSIS — J449 Chronic obstructive pulmonary disease, unspecified: Secondary | ICD-10-CM | POA: Diagnosis not present

## 2021-10-18 DIAGNOSIS — R0602 Shortness of breath: Secondary | ICD-10-CM | POA: Diagnosis not present

## 2021-10-19 DIAGNOSIS — J449 Chronic obstructive pulmonary disease, unspecified: Secondary | ICD-10-CM | POA: Diagnosis not present

## 2021-11-05 ENCOUNTER — Ambulatory Visit (HOSPITAL_COMMUNITY): Payer: Medicare HMO

## 2021-11-06 DIAGNOSIS — J449 Chronic obstructive pulmonary disease, unspecified: Secondary | ICD-10-CM | POA: Diagnosis not present

## 2021-11-08 DIAGNOSIS — I10 Essential (primary) hypertension: Secondary | ICD-10-CM | POA: Diagnosis not present

## 2021-11-08 DIAGNOSIS — J449 Chronic obstructive pulmonary disease, unspecified: Secondary | ICD-10-CM | POA: Diagnosis not present

## 2021-11-08 DIAGNOSIS — E1165 Type 2 diabetes mellitus with hyperglycemia: Secondary | ICD-10-CM | POA: Diagnosis not present

## 2021-11-08 DIAGNOSIS — I482 Chronic atrial fibrillation, unspecified: Secondary | ICD-10-CM | POA: Diagnosis not present

## 2021-11-19 ENCOUNTER — Ambulatory Visit (HOSPITAL_COMMUNITY)
Admission: RE | Admit: 2021-11-19 | Discharge: 2021-11-19 | Disposition: A | Discharge status: Home / Self Care | Payer: Medicare HMO | Source: Ambulatory Visit | Attending: Internal Medicine | Admitting: Internal Medicine

## 2021-11-19 DIAGNOSIS — Z1231 Encounter for screening mammogram for malignant neoplasm of breast: Secondary | ICD-10-CM | POA: Insufficient documentation

## 2021-11-19 DIAGNOSIS — M810 Age-related osteoporosis without current pathological fracture: Secondary | ICD-10-CM | POA: Diagnosis not present

## 2021-11-19 DIAGNOSIS — M818 Other osteoporosis without current pathological fracture: Secondary | ICD-10-CM

## 2021-11-19 DIAGNOSIS — J449 Chronic obstructive pulmonary disease, unspecified: Secondary | ICD-10-CM | POA: Diagnosis not present

## 2021-11-28 ENCOUNTER — Other Ambulatory Visit: Payer: Self-pay | Admitting: Pulmonary Disease

## 2021-12-05 DIAGNOSIS — L110 Acquired keratosis follicularis: Secondary | ICD-10-CM | POA: Diagnosis not present

## 2021-12-05 DIAGNOSIS — I739 Peripheral vascular disease, unspecified: Secondary | ICD-10-CM | POA: Diagnosis not present

## 2021-12-05 DIAGNOSIS — M79671 Pain in right foot: Secondary | ICD-10-CM | POA: Diagnosis not present

## 2021-12-05 DIAGNOSIS — M79672 Pain in left foot: Secondary | ICD-10-CM | POA: Diagnosis not present

## 2021-12-07 DIAGNOSIS — J449 Chronic obstructive pulmonary disease, unspecified: Secondary | ICD-10-CM | POA: Diagnosis not present

## 2021-12-08 DIAGNOSIS — E1165 Type 2 diabetes mellitus with hyperglycemia: Secondary | ICD-10-CM | POA: Diagnosis not present

## 2021-12-08 DIAGNOSIS — I482 Chronic atrial fibrillation, unspecified: Secondary | ICD-10-CM | POA: Diagnosis not present

## 2021-12-08 DIAGNOSIS — I10 Essential (primary) hypertension: Secondary | ICD-10-CM | POA: Diagnosis not present

## 2021-12-08 DIAGNOSIS — J449 Chronic obstructive pulmonary disease, unspecified: Secondary | ICD-10-CM | POA: Diagnosis not present

## 2021-12-11 ENCOUNTER — Encounter: Payer: Self-pay | Admitting: Pulmonary Disease

## 2021-12-11 ENCOUNTER — Ambulatory Visit: Payer: Medicare HMO | Admitting: Pulmonary Disease

## 2021-12-11 VITALS — BP 134/78 | HR 70 | Temp 97.6°F | Ht 60.0 in | Wt 135.8 lb

## 2021-12-11 DIAGNOSIS — J449 Chronic obstructive pulmonary disease, unspecified: Secondary | ICD-10-CM | POA: Diagnosis not present

## 2021-12-11 DIAGNOSIS — G4736 Sleep related hypoventilation in conditions classified elsewhere: Secondary | ICD-10-CM | POA: Diagnosis not present

## 2021-12-11 DIAGNOSIS — J439 Emphysema, unspecified: Secondary | ICD-10-CM

## 2021-12-11 NOTE — Patient Instructions (Signed)
Will arrange for a new flutter valve  Follow up in 6 months

## 2021-12-11 NOTE — Progress Notes (Signed)
[facility]  [facility]  Chief Complaint  Patient presents with   Follow-up    Breathing doing well     Constitutional:  BP 134/78 (BP Location: Left Arm, Patient Position: Sitting)   Pulse 70   Temp 97.6 F (36.4 C) (Temporal)   Ht 5' (1.524 m)   Wt 135 lb 12.8 oz (61.6 kg)   SpO2 97%   BMI 26.52 kg/m   Past Medical History:  HTN, Sycope, GERD, A fib, Anxiety, Breast cancer, Depression, HTN, TIA, HLD  Past Surgical History:  Her  has a past surgical history that includes Cardiac catheterization; Total knee arthroplasty; Total hip arthroplasty; Rotator cuff repair; Mastectomy; ORIF ankle fracture (Left, 06/01/2014); Breast capsulectomy with implant exchange (Right, 05/27/2016); Esophagogastroduodenoscopy (06/2013); Colonoscopy (08/2014); Back surgery; Abdominal hysterectomy; Cholecystectomy; Appendectomy; Breast biopsy (Left); TEE without cardioversion (N/A, 05/17/2020); and Cardioversion (N/A, 05/17/2020).  Brief Summary:  RAPHAEL ESPE is a 88 y.o. female with COPD and nocturnal hypoxemia.      Subjective:   She is here with her family.  Breathing has been okay.  Not having cough, wheeze, or sputum.  Sleeping okay.  Not having leg swelling.  Keeps up with activities at home.  Uses oxygen at night and sometimes during the day.  Was in hospital in June for dizziness.  She was having diarrhea.  Still has this, but not as bad.  Uses loperamide intermittently.  Physical Exam:   Appearance - well kempt   ENMT - no sinus tenderness, no oral exudate, no LAN, Mallampati 2 airway, no stridor  Respiratory - decreased breath sounds bilaterally, no wheezing or rales  CV - s1s2 regular rate and rhythm, no murmurs  Ext - no clubbing, no edema  Skin - no rashes  Psych - normal mood and affect     Pulmonary testing:  PFT 11/09/13 >> FEV1 1.10 (54%), FEV1% 68, TLC 5.17 (102%) PFT 11/30/14 >> FEV1 0.89 (44%), FEV1% 64, TLC 4.42 (87%), RV 3.10  (130%), DLCO 22% RAST 02/23/15 >> negative, IgE 4 A1AT 07/06/19 >> 137, MM  Chest Imaging:  HRCT chest 05/07/19 >> atherosclerosis, scarring at lung bases, numerous small nodules clustered in RML and LLL  Sleep Tests:  PSG 11/21/15 >> AHI 0.8, SpO2 low 90%  Cardiac Tests:  Echo 12/21/19 >> EF 60 to 65%, grade 2 DD, RVSP 44.7 mmHg, mod LA dilation, mod MR  Social History:  She  reports that she has never smoked. She has never used smokeless tobacco. She reports that she does not drink alcohol and does not use drugs.  Family History:  Her family history includes Breast cancer in her sister, sister and another family member; Heart disease in her brother and sister; Leukemia in her brother.     Assessment/Plan:   COPD mixed type. - continue trelegy 100 one puff daily - prn xopenex nebulizer - will arrange for a new flutter valve  Nocturnal hypoxemia 2nd to COPD. - 2 liters oxygen at night - goal SpO2 > 90%; she can use oxygen prn during the day  Persistent atrial fibrillation, chronic diastolic CHF, mitral regurgitation, HLD. - followed by Dr. Harl Bowie with [facility]  Diarrhea. - advised her to follow up with her PCP if this persists  Time Spent Involved in Patient Care on Day of Examination:  27 minutes  Follow up:   Patient Instructions  Will arrange for a new flutter valve  Follow up in 6 months  Medication List:  Allergies as of 12/11/2021       Reactions   Morphine And Related Nausea And Vomiting   Penicillins Rash   Reaction: unknown   Sulfa Antibiotics Nausea And Vomiting        Medication List        Accurate as of December 11, 2021 12:28 PM. If you have any questions, ask your nurse or doctor.          acetaminophen 500 MG tablet Commonly known as: TYLENOL Take 500 mg by mouth every 6 (six) hours as needed for moderate pain.   acetic acid-hydrocortisone OTIC solution Commonly known as: VOSOL-HC Place 2 drops into both ears at bedtime as  needed (itching).   albuterol 108 (90 Base) MCG/ACT inhaler Commonly known as: VENTOLIN HFA Inhale 2 puffs into the lungs every 6 (six) hours as needed for wheezing or shortness of breath.   atorvastatin 20 MG tablet Commonly known as: LIPITOR Take 20 mg by mouth at bedtime.   calcium carbonate 500 MG chewable tablet Commonly known as: TUMS - dosed in mg elemental calcium Chew 500 mg by mouth daily.   clonazePAM 1 MG tablet Commonly known as: KLONOPIN Take 1 tablet (1 mg total) by mouth at bedtime.   diltiazem 120 MG 24 hr capsule Commonly known as: CARDIZEM CD Take 1 capsule (120 mg total) by mouth daily. (long acting)   diltiazem 30 MG tablet Commonly known as: CARDIZEM Take 1 tablet (30 mg total) by mouth every 8 (eight) hours as needed (for heartrate greater than 100).   dofetilide 125 MCG capsule Commonly known as: TIKOSYN Take 1 capsule (125 mcg total) by mouth 2 (two) times daily.   Eliquis 5 MG Tabs tablet Generic drug: apixaban Take 1 tablet by mouth twice daily What changed: how much to take   esomeprazole 20 MG capsule Commonly known as: NEXIUM Take 20 mg by mouth every evening.   Flutter Devi 1 Device by Does not apply route in the morning, at noon, in the evening, and at bedtime.   furosemide 20 MG tablet Commonly known as: LASIX Take 1 tablet (20 mg total) by mouth as needed for edema (swelling). What changed: when to take this   ibandronate 150 MG tablet Commonly known as: BONIVA Take 150 mg by mouth every 30 (thirty) days.   levalbuterol 0.63 MG/3ML nebulizer solution Commonly known as: XOPENEX Take 3 mLs (0.63 mg total) by nebulization every 6 (six) hours as needed for wheezing or shortness of breath. What changed: when to take this   losartan 50 MG tablet Commonly known as: COZAAR Take 50 mg by mouth daily.   Magnesium Oxide 400 MG Caps Take 1 capsule (400 mg total) by mouth daily. What changed: when to take this   metoprolol succinate  50 MG 24 hr tablet Commonly known as: Toprol XL Take 1 tablet (50 mg total) by mouth daily. Take with or immediately following a meal.   multivitamin with minerals Tabs tablet Take 1 tablet by mouth every evening.   OXYGEN Inhale 2 L into the lungs continuous.   potassium chloride 10 MEQ tablet Commonly known as: KLOR-CON Take 2 tablets (20 mEq total) by mouth daily.   Probiotic Daily Caps Take 1 capsule by mouth daily.   Siltussin SA 100 MG/5ML syrup Generic drug: guaifenesin Take 5 mLs by mouth every 4 (four) hours as needed for cough.   Trelegy Ellipta 100-62.5-25 MCG/ACT Aepb Generic drug: Fluticasone-Umeclidin-Vilant INHALE ONE PUFF BY MOUTH INTO LUNGS DAILY  triamcinolone cream 0.1 % Commonly known as: KENALOG Apply 1 application topically daily as needed (hemorrhoids).   venlafaxine 75 MG tablet Commonly known as: EFFEXOR Take 75-150 mg by mouth 2 (two) times daily with a meal. Takes 150 mg in the morning and 50 mg at night        Signature:  Chesley Mires, MD [place] Pager - [phone] 12/11/2021, 12:28 PM

## 2021-12-12 DIAGNOSIS — R1310 Dysphagia, unspecified: Secondary | ICD-10-CM | POA: Diagnosis not present

## 2021-12-12 DIAGNOSIS — Z23 Encounter for immunization: Secondary | ICD-10-CM | POA: Diagnosis not present

## 2021-12-12 DIAGNOSIS — K219 Gastro-esophageal reflux disease without esophagitis: Secondary | ICD-10-CM | POA: Diagnosis not present

## 2021-12-19 DIAGNOSIS — J449 Chronic obstructive pulmonary disease, unspecified: Secondary | ICD-10-CM | POA: Diagnosis not present

## 2021-12-27 ENCOUNTER — Encounter: Payer: Self-pay | Admitting: Internal Medicine

## 2022-01-06 DIAGNOSIS — J449 Chronic obstructive pulmonary disease, unspecified: Secondary | ICD-10-CM | POA: Diagnosis not present

## 2022-01-13 DIAGNOSIS — E785 Hyperlipidemia, unspecified: Secondary | ICD-10-CM | POA: Diagnosis not present

## 2022-01-13 DIAGNOSIS — Z7901 Long term (current) use of anticoagulants: Secondary | ICD-10-CM | POA: Diagnosis not present

## 2022-01-13 DIAGNOSIS — Z96653 Presence of artificial knee joint, bilateral: Secondary | ICD-10-CM | POA: Diagnosis not present

## 2022-01-13 DIAGNOSIS — W19XXXA Unspecified fall, initial encounter: Secondary | ICD-10-CM | POA: Diagnosis not present

## 2022-01-13 DIAGNOSIS — S299XXA Unspecified injury of thorax, initial encounter: Secondary | ICD-10-CM | POA: Diagnosis not present

## 2022-01-13 DIAGNOSIS — F32A Depression, unspecified: Secondary | ICD-10-CM | POA: Diagnosis not present

## 2022-01-13 DIAGNOSIS — R911 Solitary pulmonary nodule: Secondary | ICD-10-CM | POA: Diagnosis not present

## 2022-01-13 DIAGNOSIS — I4510 Unspecified right bundle-branch block: Secondary | ICD-10-CM | POA: Diagnosis not present

## 2022-01-13 DIAGNOSIS — R0781 Pleurodynia: Secondary | ICD-10-CM | POA: Diagnosis not present

## 2022-01-13 DIAGNOSIS — S199XXA Unspecified injury of neck, initial encounter: Secondary | ICD-10-CM | POA: Diagnosis not present

## 2022-01-13 DIAGNOSIS — M47812 Spondylosis without myelopathy or radiculopathy, cervical region: Secondary | ICD-10-CM | POA: Diagnosis not present

## 2022-01-13 DIAGNOSIS — Z79891 Long term (current) use of opiate analgesic: Secondary | ICD-10-CM | POA: Diagnosis not present

## 2022-01-13 DIAGNOSIS — W182XXA Fall in (into) shower or empty bathtub, initial encounter: Secondary | ICD-10-CM | POA: Diagnosis not present

## 2022-01-13 DIAGNOSIS — S2241XA Multiple fractures of ribs, right side, initial encounter for closed fracture: Secondary | ICD-10-CM | POA: Diagnosis not present

## 2022-01-13 DIAGNOSIS — F419 Anxiety disorder, unspecified: Secondary | ICD-10-CM | POA: Diagnosis not present

## 2022-01-13 DIAGNOSIS — I10 Essential (primary) hypertension: Secondary | ICD-10-CM | POA: Diagnosis not present

## 2022-01-13 DIAGNOSIS — R918 Other nonspecific abnormal finding of lung field: Secondary | ICD-10-CM | POA: Diagnosis not present

## 2022-01-13 DIAGNOSIS — J449 Chronic obstructive pulmonary disease, unspecified: Secondary | ICD-10-CM | POA: Diagnosis not present

## 2022-01-13 DIAGNOSIS — M25531 Pain in right wrist: Secondary | ICD-10-CM | POA: Diagnosis not present

## 2022-01-13 DIAGNOSIS — M4312 Spondylolisthesis, cervical region: Secondary | ICD-10-CM | POA: Diagnosis not present

## 2022-01-13 DIAGNOSIS — Z96641 Presence of right artificial hip joint: Secondary | ICD-10-CM | POA: Diagnosis not present

## 2022-01-13 DIAGNOSIS — W228XXA Striking against or struck by other objects, initial encounter: Secondary | ICD-10-CM | POA: Diagnosis not present

## 2022-01-13 DIAGNOSIS — S0990XA Unspecified injury of head, initial encounter: Secondary | ICD-10-CM | POA: Diagnosis not present

## 2022-01-13 DIAGNOSIS — N281 Cyst of kidney, acquired: Secondary | ICD-10-CM | POA: Diagnosis not present

## 2022-01-15 ENCOUNTER — Ambulatory Visit: Payer: Medicare HMO | Admitting: Gastroenterology

## 2022-01-16 ENCOUNTER — Other Ambulatory Visit (HOSPITAL_COMMUNITY): Payer: Self-pay | Admitting: Family Medicine

## 2022-01-16 ENCOUNTER — Ambulatory Visit
Admission: RE | Admit: 2022-01-16 | Discharge: 2022-01-16 | Disposition: A | Discharge status: Home / Self Care | Payer: Medicare HMO | Source: Ambulatory Visit | Attending: Family Medicine | Admitting: Family Medicine

## 2022-01-16 ENCOUNTER — Encounter: Payer: Self-pay | Admitting: Cardiology

## 2022-01-16 ENCOUNTER — Ambulatory Visit (INDEPENDENT_AMBULATORY_CARE_PROVIDER_SITE_OTHER): Payer: Medicare HMO | Admitting: Cardiology

## 2022-01-16 VITALS — BP 138/82 | HR 79 | Ht 60.0 in | Wt 138.0 lb

## 2022-01-16 DIAGNOSIS — I10 Essential (primary) hypertension: Secondary | ICD-10-CM | POA: Insufficient documentation

## 2022-01-16 DIAGNOSIS — I5022 Chronic systolic (congestive) heart failure: Secondary | ICD-10-CM

## 2022-01-16 DIAGNOSIS — J9811 Atelectasis: Secondary | ICD-10-CM | POA: Diagnosis not present

## 2022-01-16 DIAGNOSIS — S2241XA Multiple fractures of ribs, right side, initial encounter for closed fracture: Secondary | ICD-10-CM | POA: Diagnosis not present

## 2022-01-16 DIAGNOSIS — J90 Pleural effusion, not elsewhere classified: Secondary | ICD-10-CM | POA: Diagnosis not present

## 2022-01-16 DIAGNOSIS — I4891 Unspecified atrial fibrillation: Secondary | ICD-10-CM

## 2022-01-16 DIAGNOSIS — J939 Pneumothorax, unspecified: Secondary | ICD-10-CM | POA: Insufficient documentation

## 2022-01-16 DIAGNOSIS — D6869 Other thrombophilia: Secondary | ICD-10-CM | POA: Insufficient documentation

## 2022-01-16 DIAGNOSIS — J439 Emphysema, unspecified: Secondary | ICD-10-CM | POA: Diagnosis not present

## 2022-01-16 DIAGNOSIS — R911 Solitary pulmonary nodule: Secondary | ICD-10-CM | POA: Diagnosis not present

## 2022-01-16 DIAGNOSIS — Z9181 History of falling: Secondary | ICD-10-CM | POA: Diagnosis not present

## 2022-01-16 NOTE — Progress Notes (Signed)
Clinical Summary Amanda Palmer is a 88 y.o.female seen today for follow up of the following medical problems.      1. Afib/acquired thrombophilia - she is on dofetilide, followed by EP - from notes not an ablation candidate      - no siginificant palpitations - compliant with meds   2. Chronic systolic HF - 19/4174 TTE: LVEF 60-65% - 05/2020 TEE: LVEF 30-35%. This was around the time she had issues with afib with RVR - medical therapy limited by low bp's       09/2020 echo LVEF 60-65% - no recent edema, chronic SOB overall stable       3. HTN - compliant with meds   10/2020 afib clinic 130/60     4. Hyperlpidemia - on statin   09/2021 TC 132 TG 109 HDL 61 LDL 51   5. SOB/Chronic lung disease - on home O2, followed by pulmonary     6. Vocal cord dysfunction     6. Valvular heart disease - 05/2020 TEE mod to severe, mod MR, LVEF 30-35% 09/2020 trivial MR, normalized EF   - apepars to have been funcitonal MR that has resolved.    7. Mechanical fall - recent mechanical fall just a few days ago, suffered some rib fractures      Sister of Amanda Palmer, former patient of mine who passed way in 2020 Past Medical History:  Diagnosis Date   A-fib ([place])    Anxiety    Cancer ([place])    breast   COPD (chronic obstructive pulmonary disease) (HCC)    Depression    Hypertension    TIA (transient ischemic attack)    remote     Allergies  Allergen Reactions   Morphine And Related Nausea And Vomiting   Penicillins Rash    Reaction: unknown   Sulfa Antibiotics Nausea And Vomiting     Current Outpatient Medications  Medication Sig Dispense Refill   acetaminophen (TYLENOL) 500 MG tablet Take 500 mg by mouth every 6 (six) hours as needed for moderate pain.     acetic acid-hydrocortisone (VOSOL-HC) OTIC solution Place 2 drops into both ears at bedtime as needed (itching).     albuterol (PROVENTIL HFA;VENTOLIN HFA) 108 (90 Base) MCG/ACT inhaler Inhale 2 puffs  into the lungs every 6 (six) hours as needed for wheezing or shortness of breath.     atorvastatin (LIPITOR) 20 MG tablet Take 20 mg by mouth at bedtime.     calcium carbonate (TUMS - DOSED IN MG ELEMENTAL CALCIUM) 500 MG chewable tablet Chew 500 mg by mouth daily.     clonazePAM (KLONOPIN) 1 MG tablet Take 1 tablet (1 mg total) by mouth at bedtime. 30 tablet 0   diltiazem (CARDIZEM CD) 120 MG 24 hr capsule Take 1 capsule (120 mg total) by mouth daily. (long acting) 90 capsule 3   diltiazem (CARDIZEM) 30 MG tablet Take 1 tablet (30 mg total) by mouth every 8 (eight) hours as needed (for heartrate greater than 100). 90 tablet 3   dofetilide (TIKOSYN) 125 MCG capsule Take 1 capsule (125 mcg total) by mouth 2 (two) times daily. 180 capsule 3   ELIQUIS 5 MG TABS tablet Take 1 tablet by mouth twice daily (Patient taking differently: Take 5 mg by mouth 2 (two) times daily.) 60 tablet 6   esomeprazole (NEXIUM) 20 MG capsule Take 20 mg by mouth every evening.     furosemide (LASIX) 20 MG tablet Take 1 tablet (  20 mg total) by mouth as needed for edema (swelling). (Patient taking differently: Take 20 mg by mouth daily as needed for edema (swelling).)     ibandronate (BONIVA) 150 MG tablet Take 150 mg by mouth every 30 (thirty) days.     levalbuterol (XOPENEX) 0.63 MG/3ML nebulizer solution Take 3 mLs (0.63 mg total) by nebulization every 6 (six) hours as needed for wheezing or shortness of breath. (Patient taking differently: Take 0.63 mg by nebulization 2 (two) times daily.) 360 mL 12   losartan (COZAAR) 50 MG tablet Take 50 mg by mouth daily.     Magnesium Oxide 400 MG CAPS Take 1 capsule (400 mg total) by mouth daily. (Patient taking differently: Take 400 mg by mouth every evening.) 90 capsule 3   metoprolol succinate (TOPROL XL) 50 MG 24 hr tablet Take 1 tablet (50 mg total) by mouth daily. Take with or immediately following a meal. 30 tablet 3   Multiple Vitamin (MULTIVITAMIN WITH MINERALS) TABS tablet  Take 1 tablet by mouth every evening.     OXYGEN Inhale 2 L into the lungs continuous.     potassium chloride (KLOR-CON) 10 MEQ tablet Take 2 tablets (20 mEq total) by mouth daily. 180 tablet 3   Probiotic Product (PROBIOTIC DAILY) CAPS Take 1 capsule by mouth daily.     Respiratory Therapy Supplies (FLUTTER) DEVI 1 Device by Does not apply route in the morning, at noon, in the evening, and at bedtime. 1 each 0   SILTUSSIN SA 100 MG/5ML syrup Take 5 mLs by mouth every 4 (four) hours as needed for cough.     TRELEGY ELLIPTA 100-62.5-25 MCG/ACT AEPB INHALE ONE PUFF BY MOUTH INTO LUNGS DAILY 60 each 5   triamcinolone cream (KENALOG) 0.1 % Apply 1 application topically daily as needed (hemorrhoids).     venlafaxine (EFFEXOR) 75 MG tablet Take 75-150 mg by mouth 2 (two) times daily with a meal. Takes 150 mg in the morning and 50 mg at night     No current facility-administered medications for this visit.     Past Surgical History:  Procedure Laterality Date   ABDOMINAL HYSTERECTOMY     APPENDECTOMY     BACK SURGERY     total of five surgeries   BREAST BIOPSY Left    benign   BREAST CAPSULECTOMY WITH IMPLANT EXCHANGE Right 05/27/2016   Procedure: REMOVAL AND REPLACEMENT OF RIGHT BREAST IMPLANT AND BREAST CAPSULECTOMY;  Surgeon: Cristine Polio, MD;  Location: [place];  Service: Plastics;  Laterality: Right;   CARDIAC CATHETERIZATION     CARDIOVERSION N/A 05/17/2020   Procedure: CARDIOVERSION;  Surgeon: Acie Fredrickson Wonda Cheng, MD;  Location: [facility] ENDOSCOPY;  Service: Cardiovascular;  Laterality: N/A;   CHOLECYSTECTOMY     COLONOSCOPY  08/2014   Dr. Britta Mccreedy: Small sessile polyp at the cecum, removed, tubular adenoma   ESOPHAGOGASTRODUODENOSCOPY  06/2013   Dr. Britta Mccreedy: Hiatal hernia, Schatzki ring, nonobstructive.   MASTECTOMY     right    ORIF ANKLE FRACTURE Left 06/01/2014   Procedure: OPEN REDUCTION INTERNAL FIXATION (ORIF) LEFT ANKLE FRACTURE;  Surgeon: Marybelle Killings, MD;  Location:  [facility];  Service: Orthopedics;  Laterality: Left;   ROTATOR CUFF REPAIR     left   TEE WITHOUT CARDIOVERSION N/A 05/17/2020   Procedure: TRANSESOPHAGEAL ECHOCARDIOGRAM (TEE);  Surgeon: Acie Fredrickson Wonda Cheng, MD;  Location: [place];  Service: Cardiovascular;  Laterality: N/A;   TOTAL HIP ARTHROPLASTY     right   TOTAL KNEE  ARTHROPLASTY     bilateral     Allergies  Allergen Reactions   Morphine And Related Nausea And Vomiting   Penicillins Rash    Reaction: unknown   Sulfa Antibiotics Nausea And Vomiting      Family History  Problem Relation Age of Onset   Heart disease Brother    Heart disease Sister    Breast cancer Sister    Leukemia Brother    Breast cancer Sister    Breast cancer Other        one deceased, one living   Colon cancer Neg Hx      Social History Ms. Manthei reports that she has never smoked. She has never used smokeless tobacco. Ms. Lippman reports no history of alcohol use.   Review of Systems CONSTITUTIONAL: No weight loss, fever, chills, weakness or fatigue.  HEENT: Eyes: No visual loss, blurred vision, double vision or yellow sclerae.No hearing loss, sneezing, congestion, runny nose or sore throat.  SKIN: No rash or itching.  CARDIOVASCULAR: per hpi RESPIRATORY: No shortness of breath, cough or sputum.  GASTROINTESTINAL: No anorexia, nausea, vomiting or diarrhea. No abdominal pain or blood.  GENITOURINARY: No burning on urination, no polyuria NEUROLOGICAL: No headache, dizziness, syncope, paralysis, ataxia, numbness or tingling in the extremities. No change in bowel or bladder control.  MUSCULOSKELETAL: No muscle, back pain, joint pain or stiffness.  LYMPHATICS: No enlarged nodes. No history of splenectomy.  PSYCHIATRIC: No history of depression or anxiety.  ENDOCRINOLOGIC: No reports of sweating, cold or heat intolerance. No polyuria or polydipsia.  Marland Kitchen   Physical Examination Today's Vitals   01/16/22 1437  BP: 138/82  Pulse: 79  SpO2: 100%   Weight: 138 lb (62.6 kg)  Height: 5' (1.524 m)   Body mass index is 26.95 kg/m.  Gen: resting comfortably, no acute distress HEENT: no scleral icterus, pupils equal round and reactive, no palptable cervical adenopathy,  CV: RRR, no m/r/g no jvd Resp: Clear to auscultation bilaterally GI: abdomen is soft, non-tender, non-distended, normal bowel sounds, no hepatosplenomegaly MSK: extremities are warm, no edema.  Skin: warm, no rash Neuro:  no focal deficits Psych: appropriate affect     Assessment and Plan   Afib/acquired thrombophilia - has done well on dofetilide - no signifcant symptoms, continue current meds   2. Chronic systolic HF -prior drop in LVEF likely tachymediated as it was in the setting of uncontrolled afib - LVEF has normalized by most recent echo after control of her afib - no symptoms, continue to monitor   3. HTN - Prior issues with low bp's. At 70 and with prior issues with low bp's would have conservative target. - continue current meds   F/u 6 months           Arnoldo Lenis, M.D.

## 2022-01-16 NOTE — Patient Instructions (Signed)
Medication Instructions:  Continue all current medications.   Labwork: none  Testing/Procedures: none  Follow-Up: 6 months   Any Other Special Instructions Will Be Listed Below (If Applicable).   If you need a refill on your cardiac medications before your next appointment, please call your pharmacy.  

## 2022-01-19 DIAGNOSIS — J449 Chronic obstructive pulmonary disease, unspecified: Secondary | ICD-10-CM | POA: Diagnosis not present

## 2022-01-30 ENCOUNTER — Ambulatory Visit (INDEPENDENT_AMBULATORY_CARE_PROVIDER_SITE_OTHER): Payer: Medicare HMO | Admitting: Gastroenterology

## 2022-01-30 ENCOUNTER — Encounter: Payer: Self-pay | Admitting: Gastroenterology

## 2022-01-30 ENCOUNTER — Telehealth: Payer: Self-pay | Admitting: *Deleted

## 2022-01-30 VITALS — BP 162/62 | HR 68 | Temp 97.5°F | Ht 60.0 in | Wt 133.4 lb

## 2022-01-30 DIAGNOSIS — R1310 Dysphagia, unspecified: Secondary | ICD-10-CM | POA: Diagnosis not present

## 2022-01-30 NOTE — H&P (View-Only) (Signed)
Gastroenterology Office Note    Referring Provider: Celene Squibb, MD Primary Care Physician:  Celene Squibb, MD  Primary GI: Dr. Gala Romney    Chief Complaint   Chief Complaint  Patient presents with   Dysphagia    Patient here today due to issues with dysphagia. She takes esomeprazole 20 mg daily, She gets this otc. Patient says she has issues with swallowing her food and her pills.     History of Present Illness   Amanda Palmer is an 88 y.o. female presenting today at the request of Celene Squibb, MD due to dysphagia. She is on Eliquis for afib.   Notes chronic history of GERD. Omeprazole not helpful in the past. Taking nexium now. Prior dysphagia in the past responded well to dilation in 2015 by Dr. Britta Mccreedy. Recurrent GERD symptoms over the summer. Dysphagia recurrent. Sour stomach at times. Reporting solid food and pill dysphagia. No abdominal pain. No overt GI bleeding. No odynophagia.    Past Medical History:  Diagnosis Date   A-fib ([place])    Anxiety    Cancer ([place])    breast   COPD (chronic obstructive pulmonary disease) (HCC)    Depression    Hypertension    TIA (transient ischemic attack)    remote    Past Surgical History:  Procedure Laterality Date   ABDOMINAL HYSTERECTOMY     APPENDECTOMY     BACK SURGERY     total of five surgeries   BREAST BIOPSY Left    benign   BREAST CAPSULECTOMY WITH IMPLANT EXCHANGE Right 05/27/2016   Procedure: REMOVAL AND REPLACEMENT OF RIGHT BREAST IMPLANT AND BREAST CAPSULECTOMY;  Surgeon: Cristine Polio, MD;  Location: [facility];  Service: Plastics;  Laterality: Right;   CARDIAC CATHETERIZATION     CARDIOVERSION N/A 05/17/2020   Procedure: CARDIOVERSION;  Surgeon: Acie Fredrickson Wonda Cheng, MD;  Location: [facility] ENDOSCOPY;  Service: Cardiovascular;  Laterality: N/A;   CHOLECYSTECTOMY     COLONOSCOPY  08/2014   Dr. Britta Mccreedy: Small sessile polyp at the cecum, removed, tubular adenoma   ESOPHAGOGASTRODUODENOSCOPY  06/2013   Dr.  Britta Mccreedy: Hiatal hernia, Schatzki ring, nonobstructive.   MASTECTOMY     right    ORIF ANKLE FRACTURE Left 06/01/2014   Procedure: OPEN REDUCTION INTERNAL FIXATION (ORIF) LEFT ANKLE FRACTURE;  Surgeon: Marybelle Killings, MD;  Location: [place];  Service: Orthopedics;  Laterality: Left;   ROTATOR CUFF REPAIR     left   TEE WITHOUT CARDIOVERSION N/A 05/17/2020   Procedure: TRANSESOPHAGEAL ECHOCARDIOGRAM (TEE);  Surgeon: Thayer Headings, MD;  Location: [facility] ENDOSCOPY;  Service: Cardiovascular;  Laterality: N/A;   TOTAL HIP ARTHROPLASTY     right   TOTAL KNEE ARTHROPLASTY     bilateral    Current Outpatient Medications  Medication Sig Dispense Refill   acetaminophen (TYLENOL) 500 MG tablet Take 500 mg by mouth every 6 (six) hours as needed for moderate pain.     albuterol (PROVENTIL HFA;VENTOLIN HFA) 108 (90 Base) MCG/ACT inhaler Inhale 2 puffs into the lungs every 6 (six) hours as needed for wheezing or shortness of breath.     atorvastatin (LIPITOR) 20 MG tablet Take 20 mg by mouth at bedtime.     calcium carbonate (TUMS - DOSED IN MG ELEMENTAL CALCIUM) 500 MG chewable tablet Chew 500 mg by mouth daily.     clonazePAM (KLONOPIN) 1 MG tablet Take 1 tablet (1 mg total) by mouth at bedtime. 30 tablet 0  diltiazem (CARDIZEM CD) 120 MG 24 hr capsule Take 1 capsule (120 mg total) by mouth daily. (long acting) 90 capsule 3   diltiazem (CARDIZEM) 30 MG tablet Take 1 tablet (30 mg total) by mouth every 8 (eight) hours as needed (for heartrate greater than 100). 90 tablet 3   dofetilide (TIKOSYN) 125 MCG capsule Take 1 capsule (125 mcg total) by mouth 2 (two) times daily. 180 capsule 3   ELIQUIS 5 MG TABS tablet Take 1 tablet by mouth twice daily (Patient taking differently: Take 5 mg by mouth 2 (two) times daily.) 60 tablet 6   esomeprazole (NEXIUM) 20 MG capsule Take 20 mg by mouth every evening.     furosemide (LASIX) 20 MG tablet Take 1 tablet (20 mg total) by mouth as needed for edema (swelling). (Patient  taking differently: Take 20 mg by mouth daily as needed for edema (swelling).)     levalbuterol (XOPENEX) 0.63 MG/3ML nebulizer solution Take 3 mLs (0.63 mg total) by nebulization every 6 (six) hours as needed for wheezing or shortness of breath. (Patient taking differently: Take 0.63 mg by nebulization 2 (two) times daily.) 360 mL 12   losartan (COZAAR) 50 MG tablet Take 50 mg by mouth daily.     Magnesium Oxide 400 MG CAPS Take 1 capsule (400 mg total) by mouth daily. (Patient taking differently: Take 400 mg by mouth every evening.) 90 capsule 3   metoprolol succinate (TOPROL XL) 50 MG 24 hr tablet Take 1 tablet (50 mg total) by mouth daily. Take with or immediately following a meal. 30 tablet 3   Multiple Vitamin (MULTIVITAMIN WITH MINERALS) TABS tablet Take 1 tablet by mouth every evening.     OXYGEN Inhale 2 L into the lungs continuous.     potassium chloride (KLOR-CON) 10 MEQ tablet Take 2 tablets (20 mEq total) by mouth daily. 180 tablet 3   Probiotic Product (PROBIOTIC DAILY) CAPS Take 1 capsule by mouth daily.     Respiratory Therapy Supplies (FLUTTER) DEVI 1 Device by Does not apply route in the morning, at noon, in the evening, and at bedtime. 1 each 0   SILTUSSIN SA 100 MG/5ML syrup Take 5 mLs by mouth every 4 (four) hours as needed for cough.     TRELEGY ELLIPTA 100-62.5-25 MCG/ACT AEPB INHALE ONE PUFF BY MOUTH INTO LUNGS DAILY 60 each 5   triamcinolone cream (KENALOG) 0.1 % Apply 1 application topically daily as needed (hemorrhoids).     venlafaxine (EFFEXOR) 75 MG tablet Take 75-150 mg by mouth 2 (two) times daily with a meal. Takes 150 mg in the morning and 50 mg at night     No current facility-administered medications for this visit.    Allergies as of 01/30/2022 - Review Complete 01/30/2022  Allergen Reaction Noted   Morphine and related Nausea And Vomiting 01/27/2014   Penicillins Rash 01/27/2014   Sulfa antibiotics Nausea And Vomiting 01/27/2014    Family History   Problem Relation Age of Onset   Heart disease Brother    Heart disease Sister    Breast cancer Sister    Leukemia Brother    Breast cancer Sister    Breast cancer Other        one deceased, one living   Colon cancer Neg Hx     Social History   Socioeconomic History   Marital status: Widowed    Spouse name: Not on file   Number of children: Not on file   Years of education: Not  on file   Highest education level: Not on file  Occupational History   Not on file  Tobacco Use   Smoking status: Never    Passive exposure: Never   Smokeless tobacco: Never  Vaping Use   Vaping Use: Never used  Substance and Sexual Activity   Alcohol use: No    Alcohol/week: 0.0 standard drinks of alcohol   Drug use: No   Sexual activity: Not on file  Other Topics Concern   Not on file  Social History Narrative   Not on file   Social Determinants of Health   Financial Resource Strain: Not on file  Food Insecurity: Not on file  Transportation Needs: Not on file  Physical Activity: Not on file  Stress: Not on file  Social Connections: Not on file  Intimate Partner Violence: Not on file     Review of Systems   Gen: Denies any fever, chills, fatigue, weight loss, lack of appetite.  CV: Denies chest pain, heart palpitations, peripheral edema, syncope.  Resp: Denies shortness of breath at rest or with exertion. Denies wheezing or cough.  GI: see HPI GU : Denies urinary burning, urinary frequency, urinary hesitancy MS: Denies joint pain, muscle weakness, cramps, or limitation of movement.  Derm: Denies rash, itching, dry skin Psych: Denies depression, anxiety, memory loss, and confusion Heme: Denies bruising, bleeding, and enlarged lymph nodes.   Physical Exam   BP (!) 162/62 (BP Location: Left Arm, Patient Position: Sitting, Cuff Size: Large)   Pulse 68   Temp (!) 97.5 F (36.4 C) (Temporal)   Ht 5' (1.524 m)   Wt 133 lb 6.4 oz (60.5 kg)   BMI 26.05 kg/m  General:   Alert and  oriented. Pleasant and cooperative. Well-nourished and well-developed.  Head:  Normocephalic and atraumatic. Eyes:  Without icterus Ears:  Normal auditory acuity. Lungs:  Clear to auscultation bilaterally.  Heart:  S1, S2 present without murmurs appreciated.  Abdomen:  +BS, soft, non-tender and non-distended. No HSM noted. No guarding or rebound. No masses appreciated.  Rectal:  Deferred  Msk:  Symmetrical without gross deformities. Cane for ambulation.  Extremities:  Without edema. Neurologic:  Alert and  oriented x4;  grossly normal neurologically. Skin:  Intact without significant lesions or rashes. Psych:  Alert and cooperative. Normal mood and affect.   Assessment   Amanda Palmer is an 88 y.o. female presenting today  at the request of Celene Squibb, MD due to dysphagia. She is on Eliquis for afib.    Dysphagia: in setting of chronic GERD. Recurrent over past few months. No odynophagia. Solid food and pill dysphagia. Previously responded well to dilation in 2015 by Dr. Britta Mccreedy. History of Schatzki's ring.  Will hold Eliquis X 48 hours and pursue EGD with dilation in the near future.    PLAN   Proceed with upper endoscopy.dilation by Dr. Gala Romney in near future: the risks, benefits, and alternatives have been discussed with the patient in detail. The patient states understanding and desires to proceed. ASA 3  HOLD ELIQUIS 48 hours prior  Take Nexium on empty stomach 30 minutes prior to breakfast. Discussed with patient this is best taken without food to improve efficacy.    Annitta Needs, PhD, ANP-BC [facility] Gastroenter[facility]

## 2022-01-30 NOTE — Patient Instructions (Signed)
Please take Nexium on an empty stomach in the morning, 30 minutes before breakfast.  We are arranging an upper endoscopy with dilation by Dr. Gala Romney in the near future.  Please stop Eliquis 2 days before the procedure.  Have a great holiday season!  It was a pleasure to see you today. I want to create trusting relationships with patients to provide genuine, compassionate, and quality care. I value your feedback. If you receive a survey regarding your visit,  I greatly appreciate you taking time to fill this out.   Annitta Needs, PhD, ANP-BC [facility] Gastroenterology

## 2022-01-30 NOTE — Progress Notes (Signed)
Gastroenterology Office Note    Referring Provider: Celene Squibb, MD Primary Care Physician:  Celene Squibb, MD  Primary GI: Dr. Gala Romney    Chief Complaint   Chief Complaint  Patient presents with   Dysphagia    Patient here today due to issues with dysphagia. She takes esomeprazole 20 mg daily, She gets this otc. Patient says she has issues with swallowing her food and her pills.     History of Present Illness   Amanda Palmer is an 88 y.o. female presenting today at the request of Celene Squibb, MD due to dysphagia. She is on Eliquis for afib.   Notes chronic history of GERD. Omeprazole not helpful in the past. Taking nexium now. Prior dysphagia in the past responded well to dilation in 2015 by Dr. Britta Mccreedy. Recurrent GERD symptoms over the summer. Dysphagia recurrent. Sour stomach at times. Reporting solid food and pill dysphagia. No abdominal pain. No overt GI bleeding. No odynophagia.    Past Medical History:  Diagnosis Date   A-fib ([place])    Anxiety    Cancer ([place])    breast   COPD (chronic obstructive pulmonary disease) (HCC)    Depression    Hypertension    TIA (transient ischemic attack)    remote    Past Surgical History:  Procedure Laterality Date   ABDOMINAL HYSTERECTOMY     APPENDECTOMY     BACK SURGERY     total of five surgeries   BREAST BIOPSY Left    benign   BREAST CAPSULECTOMY WITH IMPLANT EXCHANGE Right 05/27/2016   Procedure: REMOVAL AND REPLACEMENT OF RIGHT BREAST IMPLANT AND BREAST CAPSULECTOMY;  Surgeon: Cristine Polio, MD;  Location: [place];  Service: Plastics;  Laterality: Right;   CARDIAC CATHETERIZATION     CARDIOVERSION N/A 05/17/2020   Procedure: CARDIOVERSION;  Surgeon: Acie Fredrickson Wonda Cheng, MD;  Location: [facility] ENDOSCOPY;  Service: Cardiovascular;  Laterality: N/A;   CHOLECYSTECTOMY     COLONOSCOPY  08/2014   Dr. Britta Mccreedy: Small sessile polyp at the cecum, removed, tubular adenoma   ESOPHAGOGASTRODUODENOSCOPY  06/2013   Dr.  Britta Mccreedy: Hiatal hernia, Schatzki ring, nonobstructive.   MASTECTOMY     right    ORIF ANKLE FRACTURE Left 06/01/2014   Procedure: OPEN REDUCTION INTERNAL FIXATION (ORIF) LEFT ANKLE FRACTURE;  Surgeon: Marybelle Killings, MD;  Location: [place];  Service: Orthopedics;  Laterality: Left;   ROTATOR CUFF REPAIR     left   TEE WITHOUT CARDIOVERSION N/A 05/17/2020   Procedure: TRANSESOPHAGEAL ECHOCARDIOGRAM (TEE);  Surgeon: Thayer Headings, MD;  Location: [facility] ENDOSCOPY;  Service: Cardiovascular;  Laterality: N/A;   TOTAL HIP ARTHROPLASTY     right   TOTAL KNEE ARTHROPLASTY     bilateral    Current Outpatient Medications  Medication Sig Dispense Refill   acetaminophen (TYLENOL) 500 MG tablet Take 500 mg by mouth every 6 (six) hours as needed for moderate pain.     albuterol (PROVENTIL HFA;VENTOLIN HFA) 108 (90 Base) MCG/ACT inhaler Inhale 2 puffs into the lungs every 6 (six) hours as needed for wheezing or shortness of breath.     atorvastatin (LIPITOR) 20 MG tablet Take 20 mg by mouth at bedtime.     calcium carbonate (TUMS - DOSED IN MG ELEMENTAL CALCIUM) 500 MG chewable tablet Chew 500 mg by mouth daily.     clonazePAM (KLONOPIN) 1 MG tablet Take 1 tablet (1 mg total) by mouth at bedtime. 30 tablet 0  diltiazem (CARDIZEM CD) 120 MG 24 hr capsule Take 1 capsule (120 mg total) by mouth daily. (long acting) 90 capsule 3   diltiazem (CARDIZEM) 30 MG tablet Take 1 tablet (30 mg total) by mouth every 8 (eight) hours as needed (for heartrate greater than 100). 90 tablet 3   dofetilide (TIKOSYN) 125 MCG capsule Take 1 capsule (125 mcg total) by mouth 2 (two) times daily. 180 capsule 3   ELIQUIS 5 MG TABS tablet Take 1 tablet by mouth twice daily (Patient taking differently: Take 5 mg by mouth 2 (two) times daily.) 60 tablet 6   esomeprazole (NEXIUM) 20 MG capsule Take 20 mg by mouth every evening.     furosemide (LASIX) 20 MG tablet Take 1 tablet (20 mg total) by mouth as needed for edema (swelling). (Patient  taking differently: Take 20 mg by mouth daily as needed for edema (swelling).)     levalbuterol (XOPENEX) 0.63 MG/3ML nebulizer solution Take 3 mLs (0.63 mg total) by nebulization every 6 (six) hours as needed for wheezing or shortness of breath. (Patient taking differently: Take 0.63 mg by nebulization 2 (two) times daily.) 360 mL 12   losartan (COZAAR) 50 MG tablet Take 50 mg by mouth daily.     Magnesium Oxide 400 MG CAPS Take 1 capsule (400 mg total) by mouth daily. (Patient taking differently: Take 400 mg by mouth every evening.) 90 capsule 3   metoprolol succinate (TOPROL XL) 50 MG 24 hr tablet Take 1 tablet (50 mg total) by mouth daily. Take with or immediately following a meal. 30 tablet 3   Multiple Vitamin (MULTIVITAMIN WITH MINERALS) TABS tablet Take 1 tablet by mouth every evening.     OXYGEN Inhale 2 L into the lungs continuous.     potassium chloride (KLOR-CON) 10 MEQ tablet Take 2 tablets (20 mEq total) by mouth daily. 180 tablet 3   Probiotic Product (PROBIOTIC DAILY) CAPS Take 1 capsule by mouth daily.     Respiratory Therapy Supplies (FLUTTER) DEVI 1 Device by Does not apply route in the morning, at noon, in the evening, and at bedtime. 1 each 0   SILTUSSIN SA 100 MG/5ML syrup Take 5 mLs by mouth every 4 (four) hours as needed for cough.     TRELEGY ELLIPTA 100-62.5-25 MCG/ACT AEPB INHALE ONE PUFF BY MOUTH INTO LUNGS DAILY 60 each 5   triamcinolone cream (KENALOG) 0.1 % Apply 1 application topically daily as needed (hemorrhoids).     venlafaxine (EFFEXOR) 75 MG tablet Take 75-150 mg by mouth 2 (two) times daily with a meal. Takes 150 mg in the morning and 50 mg at night     No current facility-administered medications for this visit.    Allergies as of 01/30/2022 - Review Complete 01/30/2022  Allergen Reaction Noted   Morphine and related Nausea And Vomiting 01/27/2014   Penicillins Rash 01/27/2014   Sulfa antibiotics Nausea And Vomiting 01/27/2014    Family History   Problem Relation Age of Onset   Heart disease Brother    Heart disease Sister    Breast cancer Sister    Leukemia Brother    Breast cancer Sister    Breast cancer Other        one deceased, one living   Colon cancer Neg Hx     Social History   Socioeconomic History   Marital status: Widowed    Spouse name: Not on file   Number of children: Not on file   Years of education: Not  on file   Highest education level: Not on file  Occupational History   Not on file  Tobacco Use   Smoking status: Never    Passive exposure: Never   Smokeless tobacco: Never  Vaping Use   Vaping Use: Never used  Substance and Sexual Activity   Alcohol use: No    Alcohol/week: 0.0 standard drinks of alcohol   Drug use: No   Sexual activity: Not on file  Other Topics Concern   Not on file  Social History Narrative   Not on file   Social Determinants of Health   Financial Resource Strain: Not on file  Food Insecurity: Not on file  Transportation Needs: Not on file  Physical Activity: Not on file  Stress: Not on file  Social Connections: Not on file  Intimate Partner Violence: Not on file     Review of Systems   Gen: Denies any fever, chills, fatigue, weight loss, lack of appetite.  CV: Denies chest pain, heart palpitations, peripheral edema, syncope.  Resp: Denies shortness of breath at rest or with exertion. Denies wheezing or cough.  GI: see HPI GU : Denies urinary burning, urinary frequency, urinary hesitancy MS: Denies joint pain, muscle weakness, cramps, or limitation of movement.  Derm: Denies rash, itching, dry skin Psych: Denies depression, anxiety, memory loss, and confusion Heme: Denies bruising, bleeding, and enlarged lymph nodes.   Physical Exam   BP (!) 162/62 (BP Location: Left Arm, Patient Position: Sitting, Cuff Size: Large)   Pulse 68   Temp (!) 97.5 F (36.4 C) (Temporal)   Ht 5' (1.524 m)   Wt 133 lb 6.4 oz (60.5 kg)   BMI 26.05 kg/m  General:   Alert and  oriented. Pleasant and cooperative. Well-nourished and well-developed.  Head:  Normocephalic and atraumatic. Eyes:  Without icterus Ears:  Normal auditory acuity. Lungs:  Clear to auscultation bilaterally.  Heart:  S1, S2 present without murmurs appreciated.  Abdomen:  +BS, soft, non-tender and non-distended. No HSM noted. No guarding or rebound. No masses appreciated.  Rectal:  Deferred  Msk:  Symmetrical without gross deformities. Cane for ambulation.  Extremities:  Without edema. Neurologic:  Alert and  oriented x4;  grossly normal neurologically. Skin:  Intact without significant lesions or rashes. Psych:  Alert and cooperative. Normal mood and affect.   Assessment   Amanda Palmer is an 88 y.o. female presenting today  at the request of Celene Squibb, MD due to dysphagia. She is on Eliquis for afib.    Dysphagia: in setting of chronic GERD. Recurrent over past few months. No odynophagia. Solid food and pill dysphagia. Previously responded well to dilation in 2015 by Dr. Britta Mccreedy. History of Schatzki's ring.  Will hold Eliquis X 48 hours and pursue EGD with dilation in the near future.    PLAN   Proceed with upper endoscopy.dilation by Dr. Gala Romney in near future: the risks, benefits, and alternatives have been discussed with the patient in detail. The patient states understanding and desires to proceed. ASA 3  HOLD ELIQUIS 48 hours prior  Take Nexium on empty stomach 30 minutes prior to breakfast. Discussed with patient this is best taken without food to improve efficacy.    Annitta Needs, PhD, ANP-BC [facility] Gastroenterology

## 2022-01-30 NOTE — Telephone Encounter (Signed)
Per pt need to schedule with niece Inez Catalina. Pt needs EGD/DIL, asa 3 with Dr. Gala Romney, hold eliquis 48 hrs prior.  Called niece and LMOVM

## 2022-02-04 ENCOUNTER — Encounter (INDEPENDENT_AMBULATORY_CARE_PROVIDER_SITE_OTHER): Payer: Self-pay | Admitting: *Deleted

## 2022-02-04 ENCOUNTER — Other Ambulatory Visit: Payer: Self-pay

## 2022-02-04 ENCOUNTER — Emergency Department (HOSPITAL_COMMUNITY): Payer: Medicare HMO

## 2022-02-04 ENCOUNTER — Emergency Department (HOSPITAL_COMMUNITY)
Admission: EM | Admit: 2022-02-04 | Discharge: 2022-02-04 | Disposition: A | Discharge status: Home / Self Care | Payer: Medicare HMO | Attending: Emergency Medicine | Admitting: Emergency Medicine

## 2022-02-04 ENCOUNTER — Encounter (HOSPITAL_COMMUNITY): Payer: Self-pay

## 2022-02-04 DIAGNOSIS — Z7951 Long term (current) use of inhaled steroids: Secondary | ICD-10-CM | POA: Diagnosis not present

## 2022-02-04 DIAGNOSIS — R0689 Other abnormalities of breathing: Secondary | ICD-10-CM | POA: Diagnosis not present

## 2022-02-04 DIAGNOSIS — I10 Essential (primary) hypertension: Secondary | ICD-10-CM | POA: Insufficient documentation

## 2022-02-04 DIAGNOSIS — J449 Chronic obstructive pulmonary disease, unspecified: Secondary | ICD-10-CM | POA: Diagnosis not present

## 2022-02-04 DIAGNOSIS — R197 Diarrhea, unspecified: Secondary | ICD-10-CM | POA: Insufficient documentation

## 2022-02-04 DIAGNOSIS — R109 Unspecified abdominal pain: Secondary | ICD-10-CM | POA: Diagnosis not present

## 2022-02-04 DIAGNOSIS — Z79899 Other long term (current) drug therapy: Secondary | ICD-10-CM | POA: Diagnosis not present

## 2022-02-04 DIAGNOSIS — R911 Solitary pulmonary nodule: Secondary | ICD-10-CM | POA: Diagnosis not present

## 2022-02-04 DIAGNOSIS — Z743 Need for continuous supervision: Secondary | ICD-10-CM | POA: Diagnosis not present

## 2022-02-04 DIAGNOSIS — K644 Residual hemorrhoidal skin tags: Secondary | ICD-10-CM | POA: Insufficient documentation

## 2022-02-04 DIAGNOSIS — Z7901 Long term (current) use of anticoagulants: Secondary | ICD-10-CM | POA: Diagnosis not present

## 2022-02-04 DIAGNOSIS — R1030 Lower abdominal pain, unspecified: Secondary | ICD-10-CM | POA: Insufficient documentation

## 2022-02-04 DIAGNOSIS — K649 Unspecified hemorrhoids: Secondary | ICD-10-CM

## 2022-02-04 DIAGNOSIS — K625 Hemorrhage of anus and rectum: Secondary | ICD-10-CM

## 2022-02-04 DIAGNOSIS — N289 Disorder of kidney and ureter, unspecified: Secondary | ICD-10-CM | POA: Diagnosis not present

## 2022-02-04 DIAGNOSIS — T68XXXA Hypothermia, initial encounter: Secondary | ICD-10-CM | POA: Diagnosis not present

## 2022-02-04 DIAGNOSIS — I959 Hypotension, unspecified: Secondary | ICD-10-CM | POA: Diagnosis not present

## 2022-02-04 DIAGNOSIS — R58 Hemorrhage, not elsewhere classified: Secondary | ICD-10-CM | POA: Diagnosis not present

## 2022-02-04 DIAGNOSIS — R918 Other nonspecific abnormal finding of lung field: Secondary | ICD-10-CM

## 2022-02-04 LAB — BPAM RBC
Blood Product Expiration Date: 202312212359
Blood Product Expiration Date: 202312242359
Unit Type and Rh: 5100
Unit Type and Rh: 5100

## 2022-02-04 LAB — COMPREHENSIVE METABOLIC PANEL
ALT: 15 U/L (ref 0–44)
AST: 18 U/L (ref 15–41)
Albumin: 4.1 g/dL (ref 3.5–5.0)
Alkaline Phosphatase: 94 U/L (ref 38–126)
Anion gap: 12 (ref 5–15)
BUN: 19 mg/dL (ref 8–23)
CO2: 25 mmol/L (ref 22–32)
Calcium: 9.8 mg/dL (ref 8.9–10.3)
Chloride: 101 mmol/L (ref 98–111)
Creatinine, Ser: 0.9 mg/dL (ref 0.44–1.00)
GFR, Estimated: >60 mL/min (ref >60)
Glucose, Bld: 91 mg/dL (ref 70–99)
Potassium: 4.1 mmol/L (ref 3.5–5.1)
Sodium: 138 mmol/L (ref 135–145)
Total Bilirubin: 0.7 mg/dL (ref 0.3–1.2)
Total Protein: 7.1 g/dL (ref 6.5–8.1)

## 2022-02-04 LAB — CBC
HCT: 36.2 % (ref 36.0–46.0)
Hemoglobin: 11.5 g/dL — ABNORMAL LOW (ref 12.0–15.0)
MCH: 30.2 pg (ref 26.0–34.0)
MCHC: 31.8 g/dL (ref 30.0–36.0)
MCV: 95 fL (ref 80.0–100.0)
Platelets: 245 10*3/uL (ref 150–400)
RBC: 3.81 MIL/uL — ABNORMAL LOW (ref 3.87–5.11)
RDW: 14.4 % (ref 11.5–15.5)
WBC: 11.7 10*3/uL — ABNORMAL HIGH (ref 4.0–10.5)
nRBC: 0 % (ref 0.0–0.2)

## 2022-02-04 LAB — TYPE AND SCREEN
ABO/RH(D): O POS
Antibody Screen: POSITIVE
Donor AG Type: NEGATIVE
Donor AG Type: NEGATIVE
PT AG Type: NEGATIVE
Unit division: 0
Unit division: 0

## 2022-02-04 LAB — ABO/RH: ABO/RH(D): O POS

## 2022-02-04 LAB — POC OCCULT BLOOD, ED: Fecal Occult Bld: POSITIVE — AB

## 2022-02-04 MED ORDER — DOXYCYCLINE HYCLATE 100 MG PO CAPS: 100.0000 mg | ORAL_CAPSULE | Freq: Two times a day (BID) | ORAL | 0 refills | Status: DC

## 2022-02-04 MED ORDER — METOPROLOL TARTRATE 50 MG PO TABS
50.0000 mg | ORAL_TABLET | Freq: Once | ORAL | Status: AC
  Administered 2022-02-04: 50 mg via ORAL
  Filled 2022-02-04: qty 1

## 2022-02-04 MED ORDER — LOSARTAN POTASSIUM 25 MG PO TABS
50.0000 mg | ORAL_TABLET | Freq: Once | ORAL | Status: AC
  Administered 2022-02-04: 50 mg via ORAL
  Filled 2022-02-04: qty 2

## 2022-02-04 MED ORDER — HYDROCORTISONE (PERIANAL) 2.5 % EX CREA: 1.0000 | TOPICAL_CREAM | Freq: Two times a day (BID) | CUTANEOUS | 0 refills | Status: DC

## 2022-02-04 MED ORDER — IOHEXOL 300 MG/ML  SOLN
100.0000 mL | Freq: Once | INTRAMUSCULAR | Status: AC | PRN
  Administered 2022-02-04: 80 mL via INTRAVENOUS

## 2022-02-04 NOTE — ED Triage Notes (Signed)
EMS reports pt from home.  C/O diarrhea and passing bright red blood clots since around 0800 this morning.  Pt alert and oriented.  C/O abd pain.  EMS reports bp 193/78, HR 85, o2 sat 96% on room air.

## 2022-02-04 NOTE — Discharge Instructions (Signed)
You had some bleeding from your rectum this is less likely coming from a hemorrhoid.  I recommend that you use the cream as directed.  You may also try sitz bath's.  Also, the CT scan today shows that you have some nodules of your lungs.  This may be secondary to a developing infection.  You have been prescribed antibiotics for this.  Please take the doxycycline as directed until finished.  Please call your primary care provider for recheck.  Return to the emergency department for any new or worsening symptoms.

## 2022-02-04 NOTE — Telephone Encounter (Signed)
Spoke with niece and she has been scheduled for 12/14 at 2pm. Aware will send instructions/pre-op appt and also aware pt needs to hold Eliquis 2 days prior.

## 2022-02-05 NOTE — ED Provider Notes (Signed)
[facility] EMERGENCY DEPARTMENT Provider Note   CSN: [id number] Arrival date & time: 02/04/22  1300     History  Chief Complaint  Patient presents with   GI Bleeding    Amanda Palmer is a 88 y.o. female.  HPI      Amanda Palmer is a 88 y.o. female with past medical history of COPD, hypertension, atrial fibrillation, anxiety, and previous TIA who presents to the Emergency Department complaining of diarrhea and bright red blood per rectum with small blood clots.  Symptoms began after defecating around 8 AM.  She notes having some discomfort of her lower abdomen while defecating.  She denies any dizziness or syncope, chest pain, shortness of breath, recent illness, fever or chills. She also denies any flank pain or dysuria.  No hematuria.   Home Medications Prior to Admission medications   Medication Sig Start Date End Date Taking? Authorizing Provider  doxycycline (VIBRAMYCIN) 100 MG capsule Take 1 capsule (100 mg total) by mouth 2 (two) times daily. 02/04/22  Yes Anelis Hrivnak, PA-C  hydrocortisone (ANUSOL-HC) 2.5 % rectal cream Place 1 Application rectally 2 (two) times daily. 02/04/22  Yes Masami Plata, PA-C  acetaminophen (TYLENOL) 500 MG tablet Take 500 mg by mouth every 6 (six) hours as needed for moderate pain.    [provider]  albuterol (PROVENTIL HFA;VENTOLIN HFA) 108 (90 Base) MCG/ACT inhaler Inhale 2 puffs into the lungs every 6 (six) hours as needed for wheezing or shortness of breath.    [provider]  atorvastatin (LIPITOR) 20 MG tablet Take 20 mg by mouth at bedtime. 09/23/16   [provider]  calcium carbonate (TUMS - DOSED IN MG ELEMENTAL CALCIUM) 500 MG chewable tablet Chew 500 mg by mouth daily.    [provider]  clonazePAM (KLONOPIN) 1 MG tablet Take 1 tablet (1 mg total) by mouth at bedtime. 06/06/14   Lauree Chandler, NP  diltiazem (CARDIZEM CD) 120 MG 24 hr capsule Take 1 capsule (120 mg total) by mouth  daily. (long acting) 07/06/21   Tillery, Satira Mccallum, PA-C  diltiazem (CARDIZEM) 30 MG tablet Take 1 tablet (30 mg total) by mouth every 8 (eight) hours as needed (for heartrate greater than 100). 09/12/21   Tillery, Satira Mccallum, PA-C  dofetilide (TIKOSYN) 125 MCG capsule Take 1 capsule (125 mcg total) by mouth 2 (two) times daily. 09/12/21   Shirley Friar, PA-C  ELIQUIS 5 MG TABS tablet Take 1 tablet by mouth twice daily Patient taking differently: Take 5 mg by mouth 2 (two) times daily. 06/23/18   Herminio Commons, MD  esomeprazole (NEXIUM) 20 MG capsule Take 20 mg by mouth every evening.    [provider]  furosemide (LASIX) 20 MG tablet Take 1 tablet (20 mg total) by mouth as needed for edema (swelling). Patient taking differently: Take 20 mg by mouth daily as needed for edema (swelling). 01/10/21   Arnoldo Lenis, MD  levalbuterol Penne Lash) 0.63 MG/3ML nebulizer solution Take 3 mLs (0.63 mg total) by nebulization every 6 (six) hours as needed for wheezing or shortness of breath. Patient taking differently: Take 0.63 mg by nebulization 2 (two) times daily. 06/04/21   Chesley Mires, MD  losartan (COZAAR) 50 MG tablet Take 50 mg by mouth daily. 02/18/19   [provider]  Magnesium Oxide 400 MG CAPS Take 1 capsule (400 mg total) by mouth daily. Patient taking differently: Take 400 mg by mouth every evening. 07/09/21   Barrington Ellison  Mitzi Hansen, PA-C  metoprolol succinate (TOPROL XL) 50 MG 24 hr tablet Take 1 tablet (50 mg total) by mouth daily. Take with or immediately following a meal. 05/24/20 01/17/23  Fenton, Clint R, PA  Multiple Vitamin (MULTIVITAMIN WITH MINERALS) TABS tablet Take 1 tablet by mouth every evening.    [provider]  OXYGEN Inhale 2 L into the lungs continuous.    [provider]  potassium chloride (KLOR-CON) 10 MEQ tablet Take 2 tablets (20 mEq total) by mouth daily. 09/12/21   Shirley Friar, PA-C  Probiotic Product  (PROBIOTIC DAILY) CAPS Take 1 capsule by mouth daily.    [provider]  Respiratory Therapy Supplies (FLUTTER) DEVI 1 Device by Does not apply route in the morning, at noon, in the evening, and at bedtime. 04/27/19   Candee Furbish, MD  SILTUSSIN SA 100 MG/5ML syrup Take 5 mLs by mouth every 4 (four) hours as needed for cough. 05/11/20   [provider]  Donnal Debar 100-62.5-25 MCG/ACT AEPB INHALE ONE PUFF BY MOUTH INTO LUNGS DAILY 11/28/21   Chesley Mires, MD  triamcinolone cream (KENALOG) 0.1 % Apply 1 application topically daily as needed (hemorrhoids).    [provider]  venlafaxine (EFFEXOR) 75 MG tablet Take 75-150 mg by mouth 2 (two) times daily with a meal. Takes 150 mg in the morning and 50 mg at night    [provider]      Allergies    Morphine and related, Penicillins, and Sulfa antibiotics    Review of Systems   Review of Systems  Constitutional:  Negative for chills and fever.  Respiratory:  Negative for shortness of breath.   Cardiovascular:  Negative for chest pain.  Gastrointestinal:  Positive for abdominal pain, blood in stool and diarrhea. Negative for abdominal distention, nausea, rectal pain and vomiting.  Genitourinary:  Negative for difficulty urinating, flank pain and hematuria.  Musculoskeletal:  Negative for back pain.  Neurological:  Negative for dizziness, syncope, weakness, light-headedness, numbness and headaches.  Psychiatric/Behavioral:  Negative for confusion.     Physical Exam Updated Vital Signs BP (!) 198/77   Pulse 84   Temp 98.5 F (36.9 C) (Oral)   Resp (!) 22   Ht 5' (1.524 m)   Wt 59 kg   SpO2 96%   BMI 25.39 kg/m  Physical Exam Vitals and nursing note reviewed. Exam conducted with a chaperone present.  Constitutional:      General: She is not in acute distress.    Appearance: Normal appearance. She is not ill-appearing or toxic-appearing.  HENT:     Head: Atraumatic.     Mouth/Throat:      Mouth: Mucous membranes are moist.     Pharynx: Oropharynx is clear.  Cardiovascular:     Rate and Rhythm: Normal rate. Rhythm irregular.     Pulses: Normal pulses.  Pulmonary:     Effort: Pulmonary effort is normal. No respiratory distress.     Breath sounds: No wheezing or rales.  Abdominal:     General: There is no distension.     Palpations: Abdomen is soft. There is no mass.     Tenderness: There is no abdominal tenderness. There is no right CVA tenderness, left CVA tenderness or guarding.  Genitourinary:    Rectum: Guaiac result positive. External hemorrhoid present. No tenderness or anal fissure. Normal anal tone.     Comments: DRE performed by me, gross blood noted around the anus.  There is an external  hemorrhoid that is nonthrombosed.  Soft Bissonnette stool on exam, heme positive, no palpable masses within the rectum. Musculoskeletal:        General: Normal range of motion.     Right lower leg: No edema.     Left lower leg: No edema.  Skin:    General: Skin is warm.     Capillary Refill: Capillary refill takes less than 2 seconds.     Findings: No rash.  Neurological:     General: No focal deficit present.     Mental Status: She is alert.     Sensory: No sensory deficit.     Motor: No weakness.     ED Results / Procedures / Treatments   Labs (all labs ordered are listed, but only abnormal results are displayed) Labs Reviewed  CBC - Abnormal; Notable for the following components:      Result Value   WBC 11.7 (*)    RBC 3.81 (*)    Hemoglobin 11.5 (*)    All other components within normal limits  POC OCCULT BLOOD, ED - Abnormal; Notable for the following components:   Fecal Occult Bld POSITIVE (*)    All other components within normal limits  COMPREHENSIVE METABOLIC PANEL  TYPE AND SCREEN  ABO/RH    EKG EKG Interpretation  Date/Time:  Monday February 04 2022 14:14:27 EST Ventricular Rate:  95 PR Interval:  170 QRS Duration: 100 QT Interval:  380 QTC  Calculation: 478 R Axis:   -60 Text Interpretation: Sinus rhythm Left anterior fascicular block Abnormal R-wave progression, early transition Probable anteroseptal infarct, old Confirmed by Octaviano Glow [phone]) on 02/04/2022 4:21:50 PM  Radiology CT ABDOMEN PELVIS W CONTRAST  Result Date: 02/04/2022 CLINICAL DATA:  Diverticulitis, complication suspected rectal bleeding. C/O diarrhea and passing bright red blood clots since around 0800 this morning. EXAM: CT ABDOMEN AND PELVIS WITH CONTRAST TECHNIQUE: Multidetector CT imaging of the abdomen and pelvis was performed using the standard protocol following bolus administration of intravenous contrast. RADIATION DOSE REDUCTION: This exam was performed according to the departmental dose-optimization program which includes automated exposure control, adjustment of the mA and/or kV according to patient size and/or use of iterative reconstruction technique. CONTRAST:  60m OMNIPAQUE IOHEXOL 300 MG/ML  SOLN COMPARISON:  None Available. FINDINGS: Lower chest: Nodular-like 2 cm and 1.6 cm patchy ground-glass airspace opacities within the visualized right lower lobe. Pulmonary micronodules within the right middle and right lower lobes. Trace right pleural effusion. Hepatobiliary: No focal liver abnormality. No gallstones, gallbladder wall thickening, or pericholecystic fluid. No biliary dilatation. Pancreas: No focal lesion. Normal pancreatic contour. No surrounding inflammatory changes. No main pancreatic ductal dilatation. Spleen: Normal in size without focal abnormality. Adrenals/Urinary Tract: No adrenal nodule bilaterally. Bilateral kidneys enhance symmetrically. Fluid density lesion within the right kidney likely represents a simple renal cyst. Simple renal cysts, in the absence of clinically indicated signs/symptoms, require no independent follow-up. No hydronephrosis. No hydroureter. The urinary bladder is unremarkable. On delayed imaging, there is no  urothelial wall thickening and there are no filling defects in the opacified portions of the bilateral collecting systems or ureters. Stomach/Bowel: Stomach is within normal limits. No evidence of bowel wall thickening or dilatation. The appendix is not definitely identified with no inflammatory changes in the right lower quadrant to suggest acute appendicitis. Vascular/Lymphatic: No abdominal aorta or iliac aneurysm. Moderate to severe atherosclerotic plaque of the aorta and its branches. No abdominal, pelvic, or inguinal lymphadenopathy. Reproductive: Post hysterectomy. Limited evaluation due to  streak artifact originating from the right femoral surgical hardware. No adnexal mass identified. Other: No intraperitoneal free fluid. No intraperitoneal free gas. No organized fluid collection. Musculoskeletal: No abdominal wall hernia or abnormality. No suspicious lytic or blastic osseous lesions. No acute displaced fracture. Multilevel severe degenerative changes of the spine. Total right hip arthroplasty. L4-L5 posterolateral surgical hardware fusion. No CT findings suggest surgical hardware complication. IMPRESSION: 1. Couple of nodular-like patchy ground-glass airspace opacities within the visualized right lower lobe. Recommend correlation with PA and lateral chest x-ray. Finding may represent infection/inflammation. Consider follow-up CT in 3 months to evaluate for resolution. 2. Associated trace right pleural effusion. 3. Multiple right middle and lower lobe pulmonary micronodules. No follow-up needed if patient is low-risk (and has no known or suspected primary neoplasm). Non-contrast chest CT can be considered in 12 months if patient is high-risk. This recommendation follows the consensus statement: Guidelines for Management of Incidental Pulmonary Nodules Detected on CT Images: From the Fleischner Society 2017; Radiology 2017; 284:228-243. 4. No acute intra-abdominal or intrapelvic abnormality in a patient  status post hysterectomy. 5.  Aortic Atherosclerosis (ICD10-I70.0). Electronically Signed   By: Iven Finn M.D.   On: 02/04/2022 17:27    Procedures Procedures    Medications Ordered in ED Medications  losartan (COZAAR) tablet 50 mg (50 mg Oral Given 02/04/22 1912)  metoprolol tartrate (LOPRESSOR) tablet 50 mg (50 mg Oral Given 02/04/22 1747)  iohexol (OMNIPAQUE) 300 MG/ML solution 100 mL (80 mLs Intravenous Contrast Given 02/04/22 1648)    ED Course/ Medical Decision Making/ A&P                           Medical Decision Making Patient here from home for evaluation of bright red blood per rectum and diarrhea.  Symptoms began earlier this morning.  Denies dizziness, syncope, new medications, chest pain or shortness of breath.  Symptoms began after defecating.'  On exam, patient alert, nontoxic-appearing.  Vitals reviewed, she is significantly hypertensive.  States that she did not take her morning medications.  She complained of some abdominal pain prior to ER arrival, but states her abdomen is no longer hurting.  I do not appreciate any significant abdominal tenderness on my exam, her abdomen is soft.  She does have some tenderness to palpation along the right lateral ribs.  On further history, she endorses a fall a few weeks ago in which she injured her right ribs and states they are still healing.  I do not appreciate any ecchymosis, abrasions, skin changes or bony deformities of the chest.  Patient here with bright red blood per rectum endorses diarrhea this morning and on exam she has an external nonthrombosed hemorrhoid I suspect the hemorrhoid is the source of her bleeding, she does have a reassuring abdominal exam.  We will check labs and imaging for further evaluation.  Amount and/or Complexity of Data Reviewed Labs: ordered.    Details: Labs interpreted by me, no significant leukocytosis, hemoglobin 11.5 this is much improved from 5 months ago.  Her Hemoccult was positive.   Chemistries are unremarkable. Radiology: ordered.    Details: CT abdomen and pelvis ordered for further evaluation.  There is some groundglass opacities in the right lower lobe that may represent infection/inflammation there is also some micronodules within the right middle and lower lobes.  No acute intra-abdominal or pelvic abnormality. ECG/medicine tests: ordered.    Details: EKG shows sinus rhythm with left anterior fascicular block Discussion of  management or test interpretation with external provider(s): On recheck, patient resting comfortably, she was given her routine antihypertensive medications here.  Blood pressure has improved although she remains hypertensive.  Has history of same.  We will take her remaining medications when she returns home.  No evidence of endorgan damage. I have discussed CT findings with patient and family member who is at bedside.  She does not have any respiratory complaints at this time no fever.  I suspect CT findings of the lungs is incidental, but will treat with antibiotics and I have recommended close outpatient follow-up with her PCP to ensure resolution and for recheck of her blood pressure.  She is agreeable to this plan.  I will also prescribe Anusol for her hemorrhoid and recommend sitz bath's.  She appears appropriate for discharge home, all questions were answered.  Return precautions were discussed.  Risk Prescription drug management.           Final Clinical Impression(s) / ED Diagnoses Final diagnoses:  Rectal bleeding  Hemorrhoids, unspecified hemorrhoid type  Lung nodules    Rx / DC Orders ED Discharge Orders          Ordered    doxycycline (VIBRAMYCIN) 100 MG capsule  2 times daily        02/04/22 1932    hydrocortisone (ANUSOL-HC) 2.5 % rectal cream  2 times daily        02/04/22 1932              Kem Parkinson, PA-C 02/05/22 1531    Jeanell Sparrow, DO 02/06/22 0732

## 2022-02-06 DIAGNOSIS — N644 Mastodynia: Secondary | ICD-10-CM | POA: Diagnosis not present

## 2022-02-06 DIAGNOSIS — I10 Essential (primary) hypertension: Secondary | ICD-10-CM | POA: Diagnosis not present

## 2022-02-06 DIAGNOSIS — Z9189 Other specified personal risk factors, not elsewhere classified: Secondary | ICD-10-CM | POA: Diagnosis not present

## 2022-02-06 DIAGNOSIS — J449 Chronic obstructive pulmonary disease, unspecified: Secondary | ICD-10-CM | POA: Diagnosis not present

## 2022-02-06 DIAGNOSIS — K644 Residual hemorrhoidal skin tags: Secondary | ICD-10-CM | POA: Diagnosis not present

## 2022-02-06 DIAGNOSIS — K625 Hemorrhage of anus and rectum: Secondary | ICD-10-CM | POA: Diagnosis not present

## 2022-02-12 ENCOUNTER — Telehealth: Payer: Self-pay | Admitting: *Deleted

## 2022-02-12 NOTE — Patient Outreach (Signed)
  Care Coordination   02/12/2022 Name: Amanda Palmer MRN: [id number] DOB: [birth year]   Care Coordination Outreach Attempts:  An unsuccessful telephone outreach was attempted today to offer the patient information about available care coordination services as a benefit of their health plan.   Follow Up Plan:  Additional outreach attempts will be made to offer the patient care coordination information and services.   Encounter Outcome:  No Answer   Care Coordination Interventions:  No, not indicated    SIG Yomar Mejorado C. Myrtie Neither, MSN, [facility] Gerontological Nurse Practitioner [facility] Care Management [phone]

## 2022-02-13 ENCOUNTER — Ambulatory Visit: Payer: Medicare HMO | Admitting: Student

## 2022-02-14 ENCOUNTER — Telehealth: Payer: Self-pay | Admitting: *Deleted

## 2022-02-14 NOTE — Telephone Encounter (Signed)
Niece called in. She stated patient can't find her instructions or either she didn't get them. She is not sure. I advised it was mailed out 11/27. I did discuss instructions with niece over the phone. She stated she has tried calling her 20 times today and all she gets is a VM. She said no one here knows what they are doing. I advised we have been busy seeing and scheduling patients all day. If she left a VM the 1st time we would have called her back. She said she was not going to leave a VM. I advised when she called to hit option 5 that is the schedulers and will get our VM if we are busy with another pt or on another call.  She said " I am not going to hit option 5 because they don't answer the phone and I am not leaving a message". I advised well if she doesn't leave a message then we don't know they called if we are not at our desk.

## 2022-02-18 DIAGNOSIS — M818 Other osteoporosis without current pathological fracture: Secondary | ICD-10-CM | POA: Diagnosis not present

## 2022-02-18 DIAGNOSIS — R7301 Impaired fasting glucose: Secondary | ICD-10-CM | POA: Diagnosis not present

## 2022-02-18 DIAGNOSIS — E782 Mixed hyperlipidemia: Secondary | ICD-10-CM | POA: Diagnosis not present

## 2022-02-18 DIAGNOSIS — J449 Chronic obstructive pulmonary disease, unspecified: Secondary | ICD-10-CM | POA: Diagnosis not present

## 2022-02-19 ENCOUNTER — Encounter: Payer: Self-pay | Admitting: *Deleted

## 2022-02-19 ENCOUNTER — Telehealth: Payer: Self-pay | Admitting: *Deleted

## 2022-02-19 ENCOUNTER — Encounter (HOSPITAL_COMMUNITY)
Admission: RE | Admit: 2022-02-19 | Discharge: 2022-02-19 | Disposition: A | Discharge status: Home / Self Care | Payer: Medicare HMO | Source: Ambulatory Visit | Attending: Internal Medicine | Admitting: Internal Medicine

## 2022-02-19 ENCOUNTER — Encounter (HOSPITAL_COMMUNITY): Payer: Self-pay

## 2022-02-19 HISTORY — DX: Gastro-esophageal reflux disease without esophagitis: K21.9

## 2022-02-19 HISTORY — DX: Unspecified osteoarthritis, unspecified site: M19.90

## 2022-02-19 NOTE — Patient Outreach (Signed)
  Care Coordination   Initial Visit Note   02/19/2022 Name: Amanda Palmer MRN: [id number] DOB: 1933/06/08  Amanda Palmer is a 88 y.o. year old female who sees [name], [name], MD for primary care. I spoke with [name], niece of [employer] by phone today. We talked briefly last week as Ms. Hatfield returned my call to find out more about [facility]. NP explained that Dr. Juel Burrow office offers this service and NP can refer to them if niece feels like she could benefit. She acknowledges that she could use it if she does not have to pay anything out of pocket.  Ms. Lowell Guitar also asked about the Medicaid Expansion and if her aunt would qualify now. NP provided new eligibility parameters and informed her how to apply.  She would like to do so and then apply for the CAP program for in home assistance.   SDOH assessments and interventions completed:  Yes  SDOH Interventions Today    Flowsheet Row Most Recent Value  SDOH Interventions   Food Insecurity Interventions Intervention Not Indicated  Housing Interventions Intervention Not Indicated  Transportation Interventions Intervention Not Indicated  Utilities Interventions Intervention Not Indicated        Care Coordination Interventions:  Yes, provided    WILL ADVISE DR. HALL'S OFFICE THAT PT COULD BENEFIT FROM CARE MANAGEMENT IF SHE WOULD NOT BE BILLED FOR THE SERVICE.  Follow up plan: No further intervention required.   Encounter Outcome:  Pt. Visit Completed   Kayleen Memos C. Myrtie Neither, MSN, [facility] Gerontological Nurse Practitioner [facility] Care Management [phone]

## 2022-02-21 ENCOUNTER — Ambulatory Visit (HOSPITAL_COMMUNITY)
Admission: RE | Admit: 2022-02-21 | Discharge: 2022-02-21 | Disposition: A | Discharge status: Home / Self Care | Payer: Medicare HMO | Attending: Internal Medicine | Admitting: Internal Medicine

## 2022-02-21 ENCOUNTER — Encounter (HOSPITAL_COMMUNITY): Payer: Self-pay | Admitting: Internal Medicine

## 2022-02-21 ENCOUNTER — Encounter (HOSPITAL_COMMUNITY)
Admission: RE | Disposition: A | Discharge status: Home / Self Care | Payer: Self-pay | Source: Home / Self Care | Attending: Internal Medicine

## 2022-02-21 ENCOUNTER — Ambulatory Visit (HOSPITAL_COMMUNITY): Payer: Medicare HMO | Admitting: Certified Registered Nurse Anesthetist

## 2022-02-21 ENCOUNTER — Ambulatory Visit (HOSPITAL_BASED_OUTPATIENT_CLINIC_OR_DEPARTMENT_OTHER): Payer: Medicare HMO | Admitting: Certified Registered Nurse Anesthetist

## 2022-02-21 DIAGNOSIS — I10 Essential (primary) hypertension: Secondary | ICD-10-CM

## 2022-02-21 DIAGNOSIS — F418 Other specified anxiety disorders: Secondary | ICD-10-CM

## 2022-02-21 DIAGNOSIS — K222 Esophageal obstruction: Secondary | ICD-10-CM | POA: Diagnosis not present

## 2022-02-21 DIAGNOSIS — J449 Chronic obstructive pulmonary disease, unspecified: Secondary | ICD-10-CM | POA: Diagnosis not present

## 2022-02-21 DIAGNOSIS — Z7901 Long term (current) use of anticoagulants: Secondary | ICD-10-CM | POA: Diagnosis not present

## 2022-02-21 DIAGNOSIS — Z8673 Personal history of transient ischemic attack (TIA), and cerebral infarction without residual deficits: Secondary | ICD-10-CM | POA: Diagnosis not present

## 2022-02-21 DIAGNOSIS — R1310 Dysphagia, unspecified: Secondary | ICD-10-CM

## 2022-02-21 DIAGNOSIS — Z79899 Other long term (current) drug therapy: Secondary | ICD-10-CM | POA: Diagnosis not present

## 2022-02-21 DIAGNOSIS — K219 Gastro-esophageal reflux disease without esophagitis: Secondary | ICD-10-CM | POA: Diagnosis not present

## 2022-02-21 DIAGNOSIS — R69 Illness, unspecified: Secondary | ICD-10-CM | POA: Diagnosis not present

## 2022-02-21 DIAGNOSIS — I4891 Unspecified atrial fibrillation: Secondary | ICD-10-CM | POA: Insufficient documentation

## 2022-02-21 SURGERY — ESOPHAGOGASTRODUODENOSCOPY (EGD) WITH PROPOFOL
Anesthesia: General

## 2022-02-21 MED ORDER — LACTATED RINGERS IV SOLN: INTRAVENOUS | Status: DC

## 2022-02-21 MED ORDER — PROPOFOL 10 MG/ML IV BOLUS
INTRAVENOUS | Status: DC | PRN
  Administered 2022-02-21 (×2): 20 mg via INTRAVENOUS

## 2022-02-21 MED ORDER — LIDOCAINE HCL (CARDIAC) PF 100 MG/5ML IV SOSY
PREFILLED_SYRINGE | INTRAVENOUS | Status: DC | PRN
  Administered 2022-02-21: 50 mg via INTRAVENOUS

## 2022-02-21 MED ORDER — STERILE WATER FOR IRRIGATION IR SOLN
Status: DC | PRN
  Administered 2022-02-21: 60 mL

## 2022-02-21 NOTE — Interval H&P Note (Signed)
History and Physical Interval Note:  02/21/2022 4:25 PM  Amanda Palmer  has presented today for surgery, with the diagnosis of dysphagia.  The various methods of treatment have been discussed with the patient and family. After consideration of risks, benefits and other options for treatment, the patient has consented to  Procedure(s) with comments: ESOPHAGOGASTRODUODENOSCOPY (EGD) WITH PROPOFOL (N/A) - 2:00pm, asa 3 MALONEY DILATION (N/A) as a surgical intervention.  The patient's history has been reviewed, patient examined, no change in status, stable for surgery.  I have reviewed the patient's chart and labs.  Questions were answered to the patient's satisfaction.     Amanda Palmer    Patient seen and examined.   No change.  EGD with esophageal dilation as feasible/appropriate today per plan.  The risks, benefits, limitations, alternatives and imponderables have been reviewed with the patient. Potential for esophageal dilation, biopsy, etc. have also been reviewed.  Questions have been answered. All parties agreeable.     Last Eliquis 12/11.

## 2022-02-21 NOTE — Discharge Instructions (Addendum)
EGD Discharge instructions Please read the instructions outlined below and refer to this sheet in the next few weeks. These discharge instructions provide you with general information on caring for yourself after you leave the hospital. Your doctor may also give you specific instructions. While your treatment has been planned according to the most current medical practices available, unavoidable complications occasionally occur. If you have any problems or questions after discharge, please call your doctor. ACTIVITY You may resume your regular activity but move at a slower pace for the next 24 hours.  Take frequent rest periods for the next 24 hours.  Walking will help expel (get rid of) the air and reduce the bloated feeling in your abdomen.  No driving for 24 hours (because of the anesthesia (medicine) used during the test).  You may shower.  Do not sign any important legal documents or operate any machinery for 24 hours (because of the anesthesia used during the test).  NUTRITION Drink plenty of fluids.  You may resume your normal diet.  Begin with a light meal and progress to your normal diet.  Avoid alcoholic beverages for 24 hours or as instructed by your caregiver.  MEDICATIONS You may resume your normal medications unless your caregiver tells you otherwise.  WHAT YOU CAN EXPECT TODAY You may experience abdominal discomfort such as a feeling of fullness or "gas" pains.  FOLLOW-UP Your doctor will discuss the results of your test with you.  SEEK IMMEDIATE MEDICAL ATTENTION IF ANY OF THE FOLLOWING OCCUR: Excessive nausea (feeling sick to your stomach) and/or vomiting.  Severe abdominal pain and distention (swelling).  Trouble swallowing.  Temperature over 101 F (37.8 C).  Rectal bleeding or vomiting of blood.      your esophagus was stretched today  Soft food this  this evening.  Advance diet as tolerated tomorrow  Resume Eliquis tomorrow  Take Nexium or esomeprazole 20 mg  daily forever.  It is best to take this medication 30 minutes before meal.  If you think you need it more in the evening take it before supper but not at bedtime.    Office visit with [place] in 1 year  At patient request, I called Inez Catalina at [phone] -  reviewed findings and recommendations.

## 2022-02-21 NOTE — Anesthesia Postprocedure Evaluation (Signed)
Anesthesia Post Note  Patient: Amanda Palmer  Procedure(s) Performed: ESOPHAGOGASTRODUODENOSCOPY (EGD) WITH PROPOFOL [place]  Patient location during evaluation: Endoscopy Anesthesia Type: General Level of consciousness: awake and alert Pain management: pain level controlled Vital Signs Assessment: post-procedure vital signs reviewed and stable Respiratory status: spontaneous breathing, nonlabored ventilation, respiratory function stable and patient connected to nasal cannula oxygen Cardiovascular status: blood pressure returned to baseline and stable Postop Assessment: no apparent nausea or vomiting Anesthetic complications: no   There were no known notable events for this encounter.   Last Vitals:  Vitals:   02/21/22 1642 02/21/22 1654  BP:  (!) 157/67  Pulse:  69  Resp:  18  Temp:    SpO2: 98% 93%    Last Pain:  Vitals:   02/21/22 1654  TempSrc: Oral  PainSc: 0-No pain                 Trixie Rude

## 2022-02-21 NOTE — Anesthesia Preprocedure Evaluation (Signed)
Anesthesia Evaluation  Patient identified by MRN, date of birth, ID band Patient awake    Reviewed: Allergy & Precautions, NPO status , Patient's Chart, lab work & pertinent test results, Unable to perform ROS - Chart review only  Airway Mallampati: II  TM Distance: <3 FB Neck ROM: Full    Dental  (+) Teeth Intact   Pulmonary shortness of breath, COPD   breath sounds clear to auscultation       Cardiovascular hypertension, Pt. on home beta blockers and Pt. on medications + dysrhythmias  Rhythm:Irregular Rate:Normal  Echo 12/21/2019 1. Left ventricular ejection fraction, by estimation, is 60 to 65%. The left ventricle has normal function. The left ventricle has no regional wall motion abnormalities. Left ventricular diastolic parameters are consistent with Grade II diastolic dysfunction (pseudonormalization).  2. Right ventricular systolic function is normal. The right ventricular size is normal. There is mildly to moderately elevated pulmonary artery systolic pressure. The estimated right ventricular systolic pressure is 66.0 mmHg.  3. Left atrial size was moderately dilated.  4. The mitral valve is abnormal. Moderate mitral valve regurgitation.  5. The aortic valve is tricuspid. Aortic valve regurgitation is not visualized. Mild aortic valve sclerosis is present, with no evidence of aortic valve stenosis.  6. The inferior vena cava is normal in size with greater than 50% respiratory variability, suggesting right atrial pressure of 3 mmHg.    Neuro/Psych  PSYCHIATRIC DISORDERS Anxiety Depression    TIA   GI/Hepatic Neg liver ROS, hiatal hernia,GERD  ,,  Endo/Other    Renal/GU Renal InsufficiencyRenal disease     Musculoskeletal  (+) Arthritis , Osteoarthritis,    Abdominal   Peds  Hematology negative hematology ROS (+)   Anesthesia Other Findings   Reproductive/Obstetrics                              Anesthesia Physical Anesthesia Plan  ASA: III  Anesthesia Plan: General   Post-op Pain Management:    Induction: Intravenous  PONV Risk Score and Plan: 2 and Propofol infusion, Treatment may vary due to age or medical condition and TIVA  Airway Management Planned: Nasal Cannula and Natural Airway  Additional Equipment: None  Intra-op Plan:   Post-operative Plan:   Informed Consent: I have reviewed the patients History and Physical, chart, labs and discussed the procedure including the risks, benefits and alternatives for the proposed anesthesia with the patient or authorized representative who has indicated his/her understanding and acceptance.       Plan Discussed with: CRNA  Anesthesia Plan Comments:         Anesthesia Quick Evaluation

## 2022-02-21 NOTE — Anesthesia Postprocedure Evaluation (Signed)
Anesthesia Post Note  Patient: Amanda Palmer  Procedure(s) Performed: ESOPHAGOGASTRODUODENOSCOPY (EGD) WITH PROPOFOL [place]  Patient location during evaluation: Endoscopy Anesthesia Type: General Level of consciousness: awake and alert Pain management: pain level controlled Vital Signs Assessment: post-procedure vital signs reviewed and stable Respiratory status: spontaneous breathing, nonlabored ventilation, respiratory function stable and patient connected to nasal cannula oxygen Cardiovascular status: blood pressure returned to baseline and stable Postop Assessment: no apparent nausea or vomiting Anesthetic complications: no   There were no known notable events for this encounter.   Last Vitals:  Vitals:   02/21/22 1654 02/21/22 1656  BP: (!) 157/67   Pulse: 69   Resp: 18   Temp:  36.7 C  SpO2: 93%     Last Pain:  Vitals:   02/21/22 1656  TempSrc: Oral  PainSc:                  Trixie Rude

## 2022-02-22 DIAGNOSIS — R69 Illness, unspecified: Secondary | ICD-10-CM | POA: Diagnosis not present

## 2022-02-22 DIAGNOSIS — E782 Mixed hyperlipidemia: Secondary | ICD-10-CM | POA: Diagnosis not present

## 2022-02-22 DIAGNOSIS — R600 Localized edema: Secondary | ICD-10-CM | POA: Diagnosis not present

## 2022-02-22 DIAGNOSIS — I10 Essential (primary) hypertension: Secondary | ICD-10-CM | POA: Diagnosis not present

## 2022-02-22 DIAGNOSIS — I482 Chronic atrial fibrillation, unspecified: Secondary | ICD-10-CM | POA: Diagnosis not present

## 2022-02-22 DIAGNOSIS — R0602 Shortness of breath: Secondary | ICD-10-CM | POA: Diagnosis not present

## 2022-02-22 DIAGNOSIS — K219 Gastro-esophageal reflux disease without esophagitis: Secondary | ICD-10-CM | POA: Diagnosis not present

## 2022-02-22 DIAGNOSIS — I420 Dilated cardiomyopathy: Secondary | ICD-10-CM | POA: Diagnosis not present

## 2022-02-22 DIAGNOSIS — M818 Other osteoporosis without current pathological fracture: Secondary | ICD-10-CM | POA: Diagnosis not present

## 2022-02-22 DIAGNOSIS — D649 Anemia, unspecified: Secondary | ICD-10-CM | POA: Diagnosis not present

## 2022-02-22 DIAGNOSIS — I739 Peripheral vascular disease, unspecified: Secondary | ICD-10-CM | POA: Diagnosis not present

## 2022-02-22 DIAGNOSIS — J449 Chronic obstructive pulmonary disease, unspecified: Secondary | ICD-10-CM | POA: Diagnosis not present

## 2022-02-22 NOTE — Transfer of Care (Signed)
Immediate Anesthesia Transfer of Care Note  Patient: SHAUNTE TUFT  Procedure(s) Performed: ESOPHAGOGASTRODUODENOSCOPY (EGD) WITH PROPOFOL [place]  Patient Location: Short Stay  Anesthesia Type:General  Level of Consciousness: awake, alert , oriented, and patient cooperative  Airway & Oxygen Therapy: Patient Spontanous Breathing  Post-op Assessment: Report given to RN, Post -op Vital signs reviewed and stable, and Patient moving all extremities X 4  Post vital signs: Reviewed and stable  Last Vitals:  Vitals Value Taken Time  BP 157/67 02/21/22 1654  Temp 36.7 C 02/21/22 1656  Pulse 69 02/21/22 1654  Resp 18 02/21/22 1654  SpO2 93 % 02/21/22 1654    Last Pain:  Vitals:   02/21/22 1656  TempSrc: Oral  PainSc:       Patients Stated Pain Goal: 5 (09/64/38 3818)  Complications: There were no known notable events for this encounter.

## 2022-02-25 ENCOUNTER — Encounter: Payer: Self-pay | Admitting: Internal Medicine

## 2022-03-07 ENCOUNTER — Other Ambulatory Visit: Payer: Self-pay

## 2022-03-07 ENCOUNTER — Telehealth: Payer: Self-pay | Admitting: *Deleted

## 2022-03-07 DIAGNOSIS — I10 Essential (primary) hypertension: Secondary | ICD-10-CM

## 2022-03-07 NOTE — Progress Notes (Signed)
  Care Coordination   Note   03/07/2022 Name: RYLLIE NIELAND MRN: [id number] DOB: October 13, 1933  ESTEFANY GOEBEL is a 88 y.o. year old female who sees [name], [name], MD for primary care. I reached out to [name] by phone today to offer care coordination services.  Ms. Bracken was given information about Care Coordination services today including:   The Care Coordination services include support from the care team which includes your Nurse Coordinator, Clinical Social Worker, or Pharmacist.  The Care Coordination team is here to help remove barriers to the health concerns and goals most important to you. Care Coordination services are voluntary, and the patient may decline or stop services at any time by request to their care team member.   Care Coordination Consent Status: Patient agreed to services and verbal consent obtained.   Follow up plan:  Telephone appointment with care coordination team member scheduled for:  03/13/22  Encounter Outcome:  Pt. Scheduled  [place]  Direct Dial: [phone]

## 2022-03-08 DIAGNOSIS — J449 Chronic obstructive pulmonary disease, unspecified: Secondary | ICD-10-CM | POA: Diagnosis not present

## 2022-03-12 NOTE — Progress Notes (Deleted)
PCP:  Celene Squibb, MD Primary Cardiologist: Carlyle Dolly, MD Electrophysiologist: Dr. Rayann Heman -> Melida Quitter, MD   Amanda Palmer is a 89 y.o. female seen today for Melida Quitter, MD for {VISITTYPE:28148}  Past Medical History:  Diagnosis Date   A-fib [facility])    Anxiety    Arthritis    Cancer [facility])    breast   COPD (chronic obstructive pulmonary disease) ([place])    Depression    GERD (gastroesophageal reflux disease)    Hypertension    TIA (transient ischemic attack)    remote   Past Surgical History:  Procedure Laterality Date   ABDOMINAL HYSTERECTOMY     APPENDECTOMY     BACK SURGERY     total of five surgeries   BREAST BIOPSY Left    benign   BREAST CAPSULECTOMY WITH IMPLANT EXCHANGE Right 05/27/2016   Procedure: REMOVAL AND REPLACEMENT OF RIGHT BREAST IMPLANT AND BREAST CAPSULECTOMY;  Surgeon: Cristine Polio, MD;  Location: [place];  Service: Plastics;  Laterality: Right;   CARDIAC CATHETERIZATION     CARDIOVERSION N/A 05/17/2020   Procedure: CARDIOVERSION;  Surgeon: Acie Fredrickson Wonda Cheng, MD;  Location: [facility] ENDOSCOPY;  Service: Cardiovascular;  Laterality: N/A;   CHOLECYSTECTOMY     COLONOSCOPY  08/2014   Dr. Britta Mccreedy: Small sessile polyp at the cecum, removed, tubular adenoma   ESOPHAGOGASTRODUODENOSCOPY  06/2013   Dr. Britta Mccreedy: Hiatal hernia, Schatzki ring, nonobstructive.   MASTECTOMY     right    ORIF ANKLE FRACTURE Left 06/01/2014   Procedure: OPEN REDUCTION INTERNAL FIXATION (ORIF) LEFT ANKLE FRACTURE;  Surgeon: Marybelle Killings, MD;  Location: [place];  Service: Orthopedics;  Laterality: Left;   ROTATOR CUFF REPAIR     left   TEE WITHOUT CARDIOVERSION N/A 05/17/2020   Procedure: TRANSESOPHAGEAL ECHOCARDIOGRAM (TEE);  Surgeon: Thayer Headings, MD;  Location: [facility] ENDOSCOPY;  Service: Cardiovascular;  Laterality: N/A;   TOTAL HIP ARTHROPLASTY     right   TOTAL KNEE ARTHROPLASTY     bilateral    Current Outpatient Medications  Medication Sig  Dispense Refill   acetaminophen (TYLENOL) 500 MG tablet Take 500 mg by mouth every 6 (six) hours as needed for moderate pain.     albuterol (PROVENTIL HFA;VENTOLIN HFA) 108 (90 Base) MCG/ACT inhaler Inhale 2 puffs into the lungs every 6 (six) hours as needed for wheezing or shortness of breath.     albuterol (PROVENTIL) (2.5 MG/3ML) 0.083% nebulizer solution Take 2.5 mg by nebulization in the morning and at bedtime.     atorvastatin (LIPITOR) 20 MG tablet Take 20 mg by mouth at bedtime.     clonazePAM (KLONOPIN) 1 MG tablet Take 1 tablet (1 mg total) by mouth at bedtime. 30 tablet 0   diltiazem (CARDIZEM CD) 120 MG 24 hr capsule Take 1 capsule (120 mg total) by mouth daily. (long acting) 90 capsule 3   diltiazem (CARDIZEM) 30 MG tablet Take 1 tablet (30 mg total) by mouth every 8 (eight) hours as needed (for heartrate greater than 100). 90 tablet 3   dofetilide (TIKOSYN) 125 MCG capsule Take 1 capsule (125 mcg total) by mouth 2 (two) times daily. 180 capsule 3   doxycycline (VIBRAMYCIN) 100 MG capsule Take 1 capsule (100 mg total) by mouth 2 (two) times daily. 20 capsule 0   ELIQUIS 5 MG TABS tablet Take 1 tablet by mouth twice daily 60 tablet 6   esomeprazole (NEXIUM) 20 MG capsule Take 20 mg by  mouth at bedtime.     famotidine (PEPCID) 20 MG tablet Take 20 mg by mouth at bedtime.     furosemide (LASIX) 20 MG tablet Take 1 tablet (20 mg total) by mouth as needed for edema (swelling). (Patient taking differently: Take 20 mg by mouth daily as needed for edema (swelling).)     hydrocortisone (ANUSOL-HC) 2.5 % rectal cream Place 1 Application rectally 2 (two) times daily. 30 g 0   losartan (COZAAR) 50 MG tablet Take 50 mg by mouth daily.     Magnesium Oxide 400 MG CAPS Take 1 capsule (400 mg total) by mouth daily. 90 capsule 3   metoprolol succinate (TOPROL XL) 50 MG 24 hr tablet Take 1 tablet (50 mg total) by mouth daily. Take with or immediately following a meal. 30 tablet 3   Multiple Vitamin  (MULTIVITAMIN WITH MINERALS) TABS tablet Take 1 tablet by mouth daily.     OXYGEN Inhale 2 L into the lungs continuous.     potassium chloride (KLOR-CON) 10 MEQ tablet Take 2 tablets (20 mEq total) by mouth daily. 180 tablet 3   Respiratory Therapy Supplies (FLUTTER) DEVI 1 Device by Does not apply route in the morning, at noon, in the evening, and at bedtime. 1 each 0   TRELEGY ELLIPTA 100-62.5-25 MCG/ACT AEPB INHALE ONE PUFF BY MOUTH INTO LUNGS DAILY (Patient taking differently: Inhale 1 puff into the lungs at bedtime.) 60 each 5   triamcinolone cream (KENALOG) 0.5 % Apply 1 Application topically in the morning and at bedtime.     venlafaxine (EFFEXOR) 75 MG tablet Take 75-150 mg by mouth 2 (two) times daily with a meal. Takes 2 tablets (150 mg) by mouth in the morning and take 1 tablet (75 mg) by mouth at night     No current facility-administered medications for this visit.    Allergies  Allergen Reactions   Morphine And Related Nausea And Vomiting   Penicillins Rash    Reaction: unknown   Sulfa Antibiotics Nausea And Vomiting    Social History   Socioeconomic History   Marital status: Widowed    Spouse name: Not on file   Number of children: Not on file   Years of education: Not on file   Highest education level: Not on file  Occupational History   Not on file  Tobacco Use   Smoking status: Never    Passive exposure: Never   Smokeless tobacco: Never  Vaping Use   Vaping Use: Never used  Substance and Sexual Activity   Alcohol use: No    Alcohol/week: 0.0 standard drinks of alcohol   Drug use: No   Sexual activity: Not on file  Other Topics Concern   Not on file  Social History Narrative   Not on file   Social Determinants of Health   Financial Resource Strain: Not on file  Food Insecurity: No Food Insecurity (02/19/2022)   Hunger Vital Sign    Worried About Running Out of Food in the Last Year: Never true    Ran Out of Food in the Last Year: Never true   Transportation Needs: No Transportation Needs (02/19/2022)   PRAPARE - Hydrologist (Medical): No    Lack of Transportation (Non-Medical): No  Physical Activity: Not on file  Stress: Not on file  Social Connections: Not on file  Intimate Partner Violence: Not At Risk (02/19/2022)   Humiliation, Afraid, Rape, and Kick questionnaire    Fear of Current  or Ex-Partner: No    Emotionally Abused: No    Physically Abused: No    Sexually Abused: No     Review of Systems: All other systems reviewed and are otherwise negative except as noted above.  Physical Exam: There were no vitals filed for this visit.  GEN- The patient is well appearing, alert and oriented x 3 today.   HEENT: normocephalic, atraumatic; sclera clear, conjunctiva pink; hearing intact; oropharynx clear; neck supple, no JVP Lymph- no cervical lymphadenopathy Lungs- Clear to ausculation bilaterally, normal work of breathing.  No wheezes, rales, rhonchi Heart- {Blank single:19197::"Regular","Irregularly irregular"} rate and rhythm, no murmurs, rubs or gallops, PMI not laterally displaced GI- soft, non-tender, non-distended, bowel sounds present, no hepatosplenomegaly Extremities- {EDEMA TF:3416389 peripheral edema. no clubbing or cyanosis; DP/PT/radial pulses 2+ bilaterally MS- no significant deformity or atrophy Skin- warm and dry, no rash or lesion Psych- euthymic mood, full affect Neuro- strength and sensation are intact  EKG is ordered. Personal review of EKG from today shows ***  Additional studies reviewed include: Previous EP notes.   {Select studies to display:26339}  Assessment and Plan:  1. Persistent AF EKG today shows *** on tikosyn Not ablation candidate per Dr. Rayann Heman Continue Eliquis for CHA2DS2VASc  of at least 8 Labs today.   2. NICM Continue GDMT. Has previously been limited by hypotension  Follow up with {EPMDS:28135} in {EPFOLLOW UP:28173}  Shirley Friar, PA-C  03/12/22 1:57 PM

## 2022-03-13 ENCOUNTER — Encounter: Payer: Self-pay | Admitting: *Deleted

## 2022-03-13 ENCOUNTER — Ambulatory Visit: Payer: Self-pay | Admitting: *Deleted

## 2022-03-13 NOTE — Patient Instructions (Signed)
Visit Information  Thank you for taking time to visit with me today. Please don't hesitate to contact me if I can be of assistance to you.   Following are the goals we discussed today:   Goals Addressed               This Visit's Progress     Receive Assistance Applying for Medicaid and [place]. (pt-stated)   On track     Care Coordination Interventions:  Assessed Social Determinant of Health Barriers. Discussed Plans for Ongoing Care Management Follow Up. Provided Tree surgeon Information for Care Management Team. Screened for Signs & Symptoms of Depression Related to Chronic Disease State.  BUL8/GTX6 Depression Screen Completed & Results Reviewed.  Suicidal Ideation/Homicidal Ideation Assessed - None Present.      Solution-Focused Strategies Developed. Active Listening/Reflection Utilized.  Verbalization of Feelings Encouraged. Emotional Support Provided. Caregiver Stress Acknowledged. Caregiver Resources Provided. Caregiver Support Groups Offered & Self-Enrollment Encouraged. Provided Crisis Support Information, Agencies & Resources. Problem Solving Interventions Identified. Task-Centered Solutions Implemented.   Psychoeducation for Mental Health Concerns Initiated. Reviewed Prescription Medications & Discussed Importance of Compliance. Quality of Sleep Assessed & Sleep Hygiene Techniques Promoted. Discussed Higher Level of Care Options ([place], [place]), & Encouraged Consideration. Verified No In-Home Care Services, [employer], Building control surveyor, Etc., Covered Under Teaching laboratory technician.  Reviewed Designer, television/film set through [place], to [employer].  Verified Monthly Social Security Income Exceeded Poverty Guidelines for the [place] of [place], Prior to [employer] on 02/08/2022, Making Patient  Ineligible for Medicaid. Encouraged Patient to Re-Apply for Medicaid, through The [place] [phone]), Under New Medicaid Expansion Guidelines. Verified No Long-Term Care Insurance Benefits, [employer], Plans, Etc.  Confirmed Patient, Nor Deceased Husband, Were Veterans, Making Patient Ineligible for Aid & Attendance Benefits, Through [employer]. Please Review the Following Information & Applications, Mailed on 03/13/2022:  ~ 2023 Medicaid Tips  ~ Medicaid Application  ~ How to Apply for Medicaid On-Line  ~ [place] Instructions  ~ [place] Application  ~ [place] Provider List Please Be Prepared to Review, Discuss & Complete Applications for Medicaid & Personal Care Services, During Next Scheduled Telephone Outreach Call with CSW.       Our next appointment is by telephone on 03/27/2022 at 12:45 pm.  Please call the care guide team at [phone] if you need to cancel or reschedule your appointment.   If you are experiencing a Mental Health or [place] or need someone to talk to, please call the Suicide and Crisis Lifeline: 988 call the [place] National Suicide Prevention Lifeline: 770-204-9605 or TTY: 385-059-2229 TTY 770-723-7686) to talk to a trained counselor call 1-800-273-TALK (toll free, 24 hour hotline) go to [facility] [address] [place] [phone]) call the [place]: [phone] call 911  Patient verbalizes understanding of instructions and care plan provided today and agrees to view in [place]. Active MyChart status and patient understanding of how to access instructions and care plan via MyChart confirmed with patient.     Telephone follow up appointment with care management team member scheduled for:  03/27/2022 at 12:45 pm.    Nat Christen, BSW, MSW, [place]   Licensed Clinical Social Worker  [place]  Mailing [place]. [place]  [address], [place], [place] [address] Physical Address-[address], [place], [place] [address] Toll Free Main # [phone] Fax # [phone] Cell # [phone] [email or web address]

## 2022-03-13 NOTE — Patient Outreach (Signed)
Care Coordination   Initial Visit Note   03/13/2022  Name: Amanda Palmer MRN: [id number] DOB: 1934/01/17  Amanda Palmer is a 89 y.o. year old female who sees [name], [name], MD for primary care. I spoke with [name] by phone today.  What matters to the patients health and wellness today?   Receive Assistance Applying for Medicaid and [place].   Goals Addressed               This Visit's Progress     Receive Assistance Applying for Medicaid and [place]. (pt-stated)   On track     Care Coordination Interventions:  Assessed Social Determinant of Health Barriers. Discussed Plans for Ongoing Care Management Follow Up. Provided Tree surgeon Information for Care Management Team. Screened for Signs & Symptoms of Depression Related to Chronic Disease State.  RDE0/CXK4 Depression Screen Completed & Results Reviewed.  Suicidal Ideation/Homicidal Ideation Assessed - None Present.      Solution-Focused Strategies Developed. Active Listening/Reflection Utilized.  Verbalization of Feelings Encouraged. Emotional Support Provided. Caregiver Stress Acknowledged. Caregiver Resources Provided. Caregiver Support Groups Offered & Self-Enrollment Encouraged. Provided Crisis Support Information, Agencies & Resources. Problem Solving Interventions Identified. Task-Centered Solutions Implemented.   Psychoeducation for Mental Health Concerns Initiated. Reviewed Prescription Medications & Discussed Importance of Compliance. Quality of Sleep Assessed & Sleep Hygiene Techniques Promoted. Discussed Higher Level of Care Options ([place], [place]), & Encouraged Consideration. Verified No In-Home Care Services, [employer], Building control surveyor, Etc., Covered Under Teaching laboratory technician.  Reviewed Designer, television/film set through [place], to  [employer].  Verified Monthly Social Security Income Exceeded Poverty Guidelines for the [place] of [place], Prior to [employer] on 02/08/2022, Making Patient Ineligible for Medicaid. Encouraged Patient to Re-Apply for Medicaid, through The [place] [phone]), Under New Medicaid Expansion Guidelines. Verified No Long-Term Care Insurance Benefits, [employer], Plans, Etc.  Confirmed Patient, Nor Deceased Husband, Were Veterans, Making Patient Ineligible for Aid & Attendance Benefits, Through [employer]. Please Review the Following Information & Applications, Mailed on 03/13/2022:  ~ 2023 Medicaid Tips  ~ Medicaid Application  ~ How to Apply for Medicaid On-Line  ~ [place] Instructions  ~ [place] Application  ~ [place] Provider List Please Be Prepared to Review, Discuss & Complete Applications for Medicaid & Personal Care Services, During Next Scheduled Telephone Outreach Call with CSW.         SDOH assessments and interventions completed:  Yes.  SDOH Interventions Today    Flowsheet Row Most Recent Value  SDOH Interventions   Food Insecurity Interventions Intervention Not Indicated  Housing Interventions Intervention Not Indicated  Transportation Interventions Intervention Not Indicated, Patient Resources (Friends/Family)  Utilities Interventions Intervention Not Indicated  Alcohol Usage Interventions Intervention Not Indicated (Score <7)  Financial Strain Interventions Intervention Not Indicated  Physical Activity Interventions Intervention Not Indicated, Patient Refused  Stress Interventions Intervention Not Indicated  Social Connections Interventions Intervention Not Indicated     Care Coordination Interventions:  Yes, provided.   Follow up plan: Follow up call scheduled for 03/27/2022 at 12:45 pm.  Encounter Outcome:   Pt. Visit Completed.   Nat Christen, BSW, MSW, LCSW  Licensed Education officer, environmental [facility] System  Mailing [place] [address] [place], [place] [address] Physical Priscella Mann  [address], [place], [place] [address] Toll Free Main # [phone] Fax # [phone] Cell # [phone] [email or web address]

## 2022-03-20 ENCOUNTER — Ambulatory Visit: Payer: Medicare HMO | Admitting: Student

## 2022-03-20 DIAGNOSIS — I739 Peripheral vascular disease, unspecified: Secondary | ICD-10-CM | POA: Diagnosis not present

## 2022-03-20 DIAGNOSIS — I5022 Chronic systolic (congestive) heart failure: Secondary | ICD-10-CM

## 2022-03-20 DIAGNOSIS — M79671 Pain in right foot: Secondary | ICD-10-CM | POA: Diagnosis not present

## 2022-03-20 DIAGNOSIS — M79672 Pain in left foot: Secondary | ICD-10-CM | POA: Diagnosis not present

## 2022-03-20 DIAGNOSIS — I4891 Unspecified atrial fibrillation: Secondary | ICD-10-CM

## 2022-03-20 DIAGNOSIS — L110 Acquired keratosis follicularis: Secondary | ICD-10-CM | POA: Diagnosis not present

## 2022-03-21 DIAGNOSIS — Z9189 Other specified personal risk factors, not elsewhere classified: Secondary | ICD-10-CM | POA: Diagnosis not present

## 2022-03-21 DIAGNOSIS — R410 Disorientation, unspecified: Secondary | ICD-10-CM | POA: Diagnosis not present

## 2022-03-21 DIAGNOSIS — J449 Chronic obstructive pulmonary disease, unspecified: Secondary | ICD-10-CM | POA: Diagnosis not present

## 2022-03-23 NOTE — Op Note (Signed)
[facility] Patient Name: Amanda Palmer Procedure Date: 02/21/2022 4:19 PM MRN: [id number] Date of Birth: [birth year] Attending MD: Norvel Richards , MD, [id number] CSN: [id number] Age: 89 Admit Type: Outpatient Procedure:                Upper GI endoscopy Indications:              Dysphagia Providers:                Norvel Richards, MD, Gwynneth Albright RN,                            RN, Suzan Garibaldi. Risa Grill, Technician Referring MD:              Medicines:                Propofol per Anesthesia Complications:            No immediate complications. Estimated Blood Loss:     Estimated blood loss was minimal. Procedure:                Pre-Anesthesia Assessment:                           - Prior to the procedure, a History and Physical                            was performed, and patient medications and                            allergies were reviewed. The patient's tolerance of                            previous anesthesia was also reviewed. The risks                            and benefits of the procedure and the sedation                            options and risks were discussed with the patient.                            All questions were answered, and informed consent                            was obtained. Prior Anticoagulants: The patient has                            taken no anticoagulant or antiplatelet agents. ASA                            Grade Assessment: II - A patient with mild systemic                            disease. After reviewing the risks and benefits,  the patient was deemed in satisfactory condition to                            undergo the procedure.                           After obtaining informed consent, the endoscope was                            passed under direct vision. Throughout the                            procedure, the patient's blood pressure, pulse, and                             oxygen saturations were monitored continuously. The                            GIF-H190 ([id number]) scope was introduced through the                            mouth, and advanced to the second part of duodenum.                            The upper GI endoscopy was accomplished without                            difficulty. The patient tolerated the procedure                            well. Scope In: 4:41:13 PM Scope Out: 4:48:13 PM Total Procedure Duration: 0 hours 7 minutes 0 seconds  Findings:      A moderate Schatzki ring was found at the gastroesophageal junction. No       esophagitis or tumor found.      The entire examined stomach was normal. Patent pylorus.      The duodenal bulb and second portion of the duodenum were normal.       Gastric cavity empty The scope was withdrawn. Dilation was performed       with a Maloney dilator with mild resistance at 72 Fr. The dilation site       was examined following endoscope reinsertion and showed mild       improvement. Estimated blood loss was minimal. Impression:               - Moderate Schatzki ring. Dilated.                           - Normal stomach.                           - Normal duodenal bulb and second portion of the                            duodenum.                           -  No specimens collected. Moderate Sedation:      Moderate (conscious) sedation was personally administered by an       anesthesia professional. The following parameters were monitored: oxygen       saturation, heart rate, blood pressure, respiratory rate, EKG, adequacy       of pulmonary ventilation, and response to care. Recommendation:           - Patient has a contact number available for                            emergencies. The signs and symptoms of potential                            delayed complications were discussed with the                            patient. Return to normal activities tomorrow.                            Written  discharge instructions were provided to the                            patient.                           - Advance diet as tolerated.                           - Continue present medications.                           - Return to my office (date not yet determined). Procedure Code(s):        --- Professional ---                           [phone], Esophagogastroduodenoscopy, flexible,                            transoral; diagnostic, including collection of                            specimen(s) by brushing or washing, when performed                            (separate procedure)                           43450, Dilation of esophagus, by unguided sound or                            bougie, single or multiple passes Diagnosis Code(s):        --- Professional ---                           K22.2, Esophageal obstruction  R13.10, Dysphagia, unspecified CPT copyright 2022 [employer]. All rights reserved. The codes documented in this report are preliminary and upon coder review may  be revised to meet current compliance requirements. Cristopher Estimable. Eldine Rencher, MD Norvel Richards, MD 03/23/2022 12:25:26 PM This report has been signed electronically. Number of Addenda: 0

## 2022-03-24 ENCOUNTER — Other Ambulatory Visit: Payer: Self-pay

## 2022-03-24 ENCOUNTER — Inpatient Hospital Stay (HOSPITAL_COMMUNITY)
Admission: EM | Admit: 2022-03-24 | Discharge: 2022-03-29 | DRG: 312 | Disposition: A | Discharge status: Skilled Nursing Facility | Payer: Medicare HMO | Attending: Internal Medicine | Admitting: Internal Medicine

## 2022-03-24 ENCOUNTER — Encounter (HOSPITAL_COMMUNITY): Payer: Self-pay

## 2022-03-24 ENCOUNTER — Emergency Department (HOSPITAL_COMMUNITY): Payer: Medicare HMO

## 2022-03-24 DIAGNOSIS — N289 Disorder of kidney and ureter, unspecified: Secondary | ICD-10-CM | POA: Diagnosis not present

## 2022-03-24 DIAGNOSIS — Z7951 Long term (current) use of inhaled steroids: Secondary | ICD-10-CM

## 2022-03-24 DIAGNOSIS — Z806 Family history of leukemia: Secondary | ICD-10-CM

## 2022-03-24 DIAGNOSIS — R911 Solitary pulmonary nodule: Secondary | ICD-10-CM

## 2022-03-24 DIAGNOSIS — Z9049 Acquired absence of other specified parts of digestive tract: Secondary | ICD-10-CM

## 2022-03-24 DIAGNOSIS — I4819 Other persistent atrial fibrillation: Secondary | ICD-10-CM | POA: Diagnosis present

## 2022-03-24 DIAGNOSIS — S0990XA Unspecified injury of head, initial encounter: Secondary | ICD-10-CM | POA: Diagnosis not present

## 2022-03-24 DIAGNOSIS — Z880 Allergy status to penicillin: Secondary | ICD-10-CM

## 2022-03-24 DIAGNOSIS — R410 Disorientation, unspecified: Secondary | ICD-10-CM | POA: Diagnosis not present

## 2022-03-24 DIAGNOSIS — I5022 Chronic systolic (congestive) heart failure: Secondary | ICD-10-CM | POA: Diagnosis present

## 2022-03-24 DIAGNOSIS — I452 Bifascicular block: Secondary | ICD-10-CM | POA: Diagnosis present

## 2022-03-24 DIAGNOSIS — Y92410 Unspecified street and highway as the place of occurrence of the external cause: Secondary | ICD-10-CM | POA: Diagnosis not present

## 2022-03-24 DIAGNOSIS — R413 Other amnesia: Secondary | ICD-10-CM | POA: Diagnosis present

## 2022-03-24 DIAGNOSIS — Z96641 Presence of right artificial hip joint: Secondary | ICD-10-CM | POA: Diagnosis present

## 2022-03-24 DIAGNOSIS — K219 Gastro-esophageal reflux disease without esophagitis: Secondary | ICD-10-CM | POA: Diagnosis present

## 2022-03-24 DIAGNOSIS — R55 Syncope and collapse: Secondary | ICD-10-CM | POA: Diagnosis present

## 2022-03-24 DIAGNOSIS — H9190 Unspecified hearing loss, unspecified ear: Secondary | ICD-10-CM | POA: Diagnosis present

## 2022-03-24 DIAGNOSIS — M4312 Spondylolisthesis, cervical region: Secondary | ICD-10-CM | POA: Diagnosis not present

## 2022-03-24 DIAGNOSIS — S2241XA Multiple fractures of ribs, right side, initial encounter for closed fracture: Secondary | ICD-10-CM | POA: Diagnosis not present

## 2022-03-24 DIAGNOSIS — Z23 Encounter for immunization: Secondary | ICD-10-CM

## 2022-03-24 DIAGNOSIS — S0993XA Unspecified injury of face, initial encounter: Secondary | ICD-10-CM | POA: Diagnosis not present

## 2022-03-24 DIAGNOSIS — W1839XA Other fall on same level, initial encounter: Secondary | ICD-10-CM | POA: Diagnosis not present

## 2022-03-24 DIAGNOSIS — Z7901 Long term (current) use of anticoagulants: Secondary | ICD-10-CM

## 2022-03-24 DIAGNOSIS — R2681 Unsteadiness on feet: Secondary | ICD-10-CM | POA: Diagnosis not present

## 2022-03-24 DIAGNOSIS — Z882 Allergy status to sulfonamides status: Secondary | ICD-10-CM

## 2022-03-24 DIAGNOSIS — Z743 Need for continuous supervision: Secondary | ICD-10-CM | POA: Diagnosis not present

## 2022-03-24 DIAGNOSIS — Z90710 Acquired absence of both cervix and uterus: Secondary | ICD-10-CM

## 2022-03-24 DIAGNOSIS — I4821 Permanent atrial fibrillation: Secondary | ICD-10-CM | POA: Diagnosis present

## 2022-03-24 DIAGNOSIS — R0689 Other abnormalities of breathing: Secondary | ICD-10-CM | POA: Diagnosis not present

## 2022-03-24 DIAGNOSIS — Z853 Personal history of malignant neoplasm of breast: Secondary | ICD-10-CM

## 2022-03-24 DIAGNOSIS — H05232 Hemorrhage of left orbit: Secondary | ICD-10-CM

## 2022-03-24 DIAGNOSIS — Z8249 Family history of ischemic heart disease and other diseases of the circulatory system: Secondary | ICD-10-CM

## 2022-03-24 DIAGNOSIS — I10 Essential (primary) hypertension: Secondary | ICD-10-CM | POA: Diagnosis not present

## 2022-03-24 DIAGNOSIS — Z96653 Presence of artificial knee joint, bilateral: Secondary | ICD-10-CM | POA: Diagnosis present

## 2022-03-24 DIAGNOSIS — F419 Anxiety disorder, unspecified: Secondary | ICD-10-CM | POA: Diagnosis present

## 2022-03-24 DIAGNOSIS — Z885 Allergy status to narcotic agent status: Secondary | ICD-10-CM

## 2022-03-24 DIAGNOSIS — Z041 Encounter for examination and observation following transport accident: Secondary | ICD-10-CM | POA: Diagnosis not present

## 2022-03-24 DIAGNOSIS — R9431 Abnormal electrocardiogram [ECG] [EKG]: Secondary | ICD-10-CM

## 2022-03-24 DIAGNOSIS — Z79899 Other long term (current) drug therapy: Secondary | ICD-10-CM

## 2022-03-24 DIAGNOSIS — R918 Other nonspecific abnormal finding of lung field: Secondary | ICD-10-CM

## 2022-03-24 DIAGNOSIS — J984 Other disorders of lung: Secondary | ICD-10-CM | POA: Diagnosis not present

## 2022-03-24 DIAGNOSIS — I482 Chronic atrial fibrillation, unspecified: Secondary | ICD-10-CM | POA: Diagnosis present

## 2022-03-24 DIAGNOSIS — I700 Atherosclerosis of aorta: Secondary | ICD-10-CM | POA: Diagnosis not present

## 2022-03-24 DIAGNOSIS — Z8673 Personal history of transient ischemic attack (TIA), and cerebral infarction without residual deficits: Secondary | ICD-10-CM

## 2022-03-24 DIAGNOSIS — R0902 Hypoxemia: Secondary | ICD-10-CM | POA: Diagnosis present

## 2022-03-24 DIAGNOSIS — I951 Orthostatic hypotension: Principal | ICD-10-CM | POA: Diagnosis present

## 2022-03-24 DIAGNOSIS — S0512XA Contusion of eyeball and orbital tissues, left eye, initial encounter: Secondary | ICD-10-CM | POA: Diagnosis not present

## 2022-03-24 DIAGNOSIS — Z803 Family history of malignant neoplasm of breast: Secondary | ICD-10-CM

## 2022-03-24 DIAGNOSIS — Z9011 Acquired absence of right breast and nipple: Secondary | ICD-10-CM

## 2022-03-24 DIAGNOSIS — E785 Hyperlipidemia, unspecified: Secondary | ICD-10-CM | POA: Diagnosis present

## 2022-03-24 DIAGNOSIS — R296 Repeated falls: Secondary | ICD-10-CM | POA: Diagnosis present

## 2022-03-24 DIAGNOSIS — M40204 Unspecified kyphosis, thoracic region: Secondary | ICD-10-CM | POA: Diagnosis not present

## 2022-03-24 DIAGNOSIS — F32A Depression, unspecified: Secondary | ICD-10-CM | POA: Diagnosis present

## 2022-03-24 DIAGNOSIS — J449 Chronic obstructive pulmonary disease, unspecified: Secondary | ICD-10-CM | POA: Diagnosis present

## 2022-03-24 DIAGNOSIS — I110 Hypertensive heart disease with heart failure: Secondary | ICD-10-CM | POA: Diagnosis present

## 2022-03-24 DIAGNOSIS — S3993XA Unspecified injury of pelvis, initial encounter: Secondary | ICD-10-CM | POA: Diagnosis not present

## 2022-03-24 DIAGNOSIS — S0012XA Contusion of left eyelid and periocular area, initial encounter: Secondary | ICD-10-CM | POA: Diagnosis present

## 2022-03-24 LAB — CBC
HCT: 35.3 % — ABNORMAL LOW (ref 36.0–46.0)
Hemoglobin: 11.3 g/dL — ABNORMAL LOW (ref 12.0–15.0)
MCH: 30.6 pg (ref 26.0–34.0)
MCHC: 32 g/dL (ref 30.0–36.0)
MCV: 95.7 fL (ref 80.0–100.0)
Platelets: 236 10*3/uL (ref 150–400)
RBC: 3.69 MIL/uL — ABNORMAL LOW (ref 3.87–5.11)
RDW: 14.1 % (ref 11.5–15.5)
WBC: 5.4 10*3/uL (ref 4.0–10.5)
nRBC: 0 % (ref 0.0–0.2)

## 2022-03-24 LAB — COMPREHENSIVE METABOLIC PANEL
ALT: 20 U/L (ref 0–44)
AST: 24 U/L (ref 15–41)
Albumin: 3.7 g/dL (ref 3.5–5.0)
Alkaline Phosphatase: 78 U/L (ref 38–126)
Anion gap: 8 (ref 5–15)
BUN: 19 mg/dL (ref 8–23)
CO2: 26 mmol/L (ref 22–32)
Calcium: 9.6 mg/dL (ref 8.9–10.3)
Chloride: 102 mmol/L (ref 98–111)
Creatinine, Ser: 0.93 mg/dL (ref 0.44–1.00)
GFR, Estimated: 60 mL/min — ABNORMAL LOW (ref >60)
Glucose, Bld: 98 mg/dL (ref 70–99)
Potassium: 4.5 mmol/L (ref 3.5–5.1)
Sodium: 136 mmol/L (ref 135–145)
Total Bilirubin: 0.3 mg/dL (ref 0.3–1.2)
Total Protein: 6.6 g/dL (ref 6.5–8.1)

## 2022-03-24 LAB — I-STAT CHEM 8, ED
BUN: 20 mg/dL (ref 8–23)
Calcium, Ion: 1.19 mmol/L (ref 1.15–1.40)
Chloride: 102 mmol/L (ref 98–111)
Creatinine, Ser: 0.9 mg/dL (ref 0.44–1.00)
Glucose, Bld: 93 mg/dL (ref 70–99)
HCT: 33 % — ABNORMAL LOW (ref 36.0–46.0)
Hemoglobin: 11.2 g/dL — ABNORMAL LOW (ref 12.0–15.0)
Potassium: 4.5 mmol/L (ref 3.5–5.1)
Sodium: 137 mmol/L (ref 135–145)
TCO2: 27 mmol/L (ref 22–32)

## 2022-03-24 LAB — URINALYSIS, ROUTINE W REFLEX MICROSCOPIC
Bacteria, UA: NONE SEEN
Bilirubin Urine: NEGATIVE
Glucose, UA: NEGATIVE mg/dL
Hgb urine dipstick: NEGATIVE
Ketones, ur: NEGATIVE mg/dL
Nitrite: NEGATIVE
Protein, ur: NEGATIVE mg/dL
Specific Gravity, Urine: 1.026 (ref 1.005–1.030)
pH: 7 (ref 5.0–8.0)

## 2022-03-24 LAB — PROTIME-INR
INR: 1.3 — ABNORMAL HIGH (ref 0.8–1.2)
Prothrombin Time: 16.3 seconds — ABNORMAL HIGH (ref 11.4–15.2)

## 2022-03-24 LAB — ETHANOL: Alcohol, Ethyl (B): <10 mg/dL (ref <10)

## 2022-03-24 LAB — CBG MONITORING, ED: Glucose-Capillary: 98 mg/dL (ref 70–99)

## 2022-03-24 LAB — LACTIC ACID, PLASMA: Lactic Acid, Venous: 1 mmol/L (ref 0.5–1.9)

## 2022-03-24 MED ORDER — TETANUS-DIPHTH-ACELL PERTUSSIS 5-2.5-18.5 LF-MCG/0.5 IM SUSY
0.5000 mL | PREFILLED_SYRINGE | Freq: Once | INTRAMUSCULAR | Status: AC
  Administered 2022-03-24: 0.5 mL via INTRAMUSCULAR
  Filled 2022-03-24: qty 0.5

## 2022-03-24 MED ORDER — IOHEXOL 350 MG/ML SOLN
75.0000 mL | Freq: Once | INTRAVENOUS | Status: AC | PRN
  Administered 2022-03-24: 75 mL via INTRAVENOUS
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
## 2022-03-24 NOTE — ED Notes (Signed)
Pur-wick in place °

## 2022-03-24 NOTE — Progress Notes (Signed)
Orthopedic Tech Progress Note Patient Details:  Amanda Palmer Oct 15, 1933 [id number]  Patient ID: Amanda Palmer, female   DOB: 1933/07/27, 89 y.o.   MRN: [id number] I attended trauma page. Karolee Stamps 03/24/2022, 8:08 PM
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
## 2022-03-24 NOTE — ED Notes (Signed)
Urine sent to main lab.

## 2022-03-25 ENCOUNTER — Observation Stay (HOSPITAL_COMMUNITY): Payer: Medicare HMO

## 2022-03-25 ENCOUNTER — Other Ambulatory Visit: Payer: Self-pay

## 2022-03-25 DIAGNOSIS — H05232 Hemorrhage of left orbit: Secondary | ICD-10-CM

## 2022-03-25 DIAGNOSIS — I110 Hypertensive heart disease with heart failure: Secondary | ICD-10-CM | POA: Diagnosis not present

## 2022-03-25 DIAGNOSIS — H9190 Unspecified hearing loss, unspecified ear: Secondary | ICD-10-CM | POA: Diagnosis not present

## 2022-03-25 DIAGNOSIS — I951 Orthostatic hypotension: Secondary | ICD-10-CM | POA: Diagnosis not present

## 2022-03-25 DIAGNOSIS — R0902 Hypoxemia: Secondary | ICD-10-CM | POA: Diagnosis not present

## 2022-03-25 DIAGNOSIS — K219 Gastro-esophageal reflux disease without esophagitis: Secondary | ICD-10-CM | POA: Diagnosis not present

## 2022-03-25 DIAGNOSIS — R55 Syncope and collapse: Secondary | ICD-10-CM | POA: Diagnosis not present

## 2022-03-25 DIAGNOSIS — R2681 Unsteadiness on feet: Secondary | ICD-10-CM

## 2022-03-25 DIAGNOSIS — Z7901 Long term (current) use of anticoagulants: Secondary | ICD-10-CM | POA: Diagnosis not present

## 2022-03-25 DIAGNOSIS — Z7951 Long term (current) use of inhaled steroids: Secondary | ICD-10-CM | POA: Diagnosis not present

## 2022-03-25 DIAGNOSIS — Z853 Personal history of malignant neoplasm of breast: Secondary | ICD-10-CM | POA: Diagnosis not present

## 2022-03-25 DIAGNOSIS — F32A Depression, unspecified: Secondary | ICD-10-CM | POA: Diagnosis not present

## 2022-03-25 DIAGNOSIS — R911 Solitary pulmonary nodule: Secondary | ICD-10-CM

## 2022-03-25 DIAGNOSIS — R9431 Abnormal electrocardiogram [ECG] [EKG]: Secondary | ICD-10-CM

## 2022-03-25 DIAGNOSIS — I5022 Chronic systolic (congestive) heart failure: Secondary | ICD-10-CM | POA: Diagnosis not present

## 2022-03-25 DIAGNOSIS — I4821 Permanent atrial fibrillation: Secondary | ICD-10-CM | POA: Diagnosis not present

## 2022-03-25 DIAGNOSIS — W1839XA Other fall on same level, initial encounter: Secondary | ICD-10-CM | POA: Diagnosis not present

## 2022-03-25 DIAGNOSIS — F419 Anxiety disorder, unspecified: Secondary | ICD-10-CM | POA: Diagnosis not present

## 2022-03-25 DIAGNOSIS — D6869 Other thrombophilia: Secondary | ICD-10-CM | POA: Diagnosis not present

## 2022-03-25 DIAGNOSIS — Z882 Allergy status to sulfonamides status: Secondary | ICD-10-CM | POA: Diagnosis not present

## 2022-03-25 DIAGNOSIS — J42 Unspecified chronic bronchitis: Secondary | ICD-10-CM | POA: Diagnosis not present

## 2022-03-25 DIAGNOSIS — I452 Bifascicular block: Secondary | ICD-10-CM | POA: Diagnosis not present

## 2022-03-25 DIAGNOSIS — R413 Other amnesia: Secondary | ICD-10-CM | POA: Diagnosis not present

## 2022-03-25 DIAGNOSIS — Z885 Allergy status to narcotic agent status: Secondary | ICD-10-CM | POA: Diagnosis not present

## 2022-03-25 DIAGNOSIS — Z23 Encounter for immunization: Secondary | ICD-10-CM | POA: Diagnosis not present

## 2022-03-25 DIAGNOSIS — Z880 Allergy status to penicillin: Secondary | ICD-10-CM | POA: Diagnosis not present

## 2022-03-25 DIAGNOSIS — E785 Hyperlipidemia, unspecified: Secondary | ICD-10-CM | POA: Diagnosis not present

## 2022-03-25 DIAGNOSIS — I4819 Other persistent atrial fibrillation: Secondary | ICD-10-CM | POA: Diagnosis not present

## 2022-03-25 DIAGNOSIS — Y92410 Unspecified street and highway as the place of occurrence of the external cause: Secondary | ICD-10-CM | POA: Diagnosis not present

## 2022-03-25 DIAGNOSIS — R918 Other nonspecific abnormal finding of lung field: Secondary | ICD-10-CM

## 2022-03-25 DIAGNOSIS — Z79899 Other long term (current) drug therapy: Secondary | ICD-10-CM | POA: Diagnosis not present

## 2022-03-25 DIAGNOSIS — S0012XA Contusion of left eyelid and periocular area, initial encounter: Secondary | ICD-10-CM | POA: Diagnosis not present

## 2022-03-25 DIAGNOSIS — J449 Chronic obstructive pulmonary disease, unspecified: Secondary | ICD-10-CM | POA: Diagnosis not present

## 2022-03-25 LAB — ECHOCARDIOGRAM COMPLETE
Area-P 1/2: 3.76 cm2
Height: 60 in
MV M vel: 5.21 m/s
MV Peak grad: 108.6 mmHg
MV VTI: 1.87 cm2
Radius: 0.5 cm
S' Lateral: 3.3 cm
Weight: 2081.14 oz

## 2022-03-25 LAB — MAGNESIUM: Magnesium: 2.1 mg/dL (ref 1.7–2.4)

## 2022-03-25 LAB — CBC
HCT: 32.9 % — ABNORMAL LOW (ref 36.0–46.0)
Hemoglobin: 10.5 g/dL — ABNORMAL LOW (ref 12.0–15.0)
MCH: 30.3 pg (ref 26.0–34.0)
MCHC: 31.9 g/dL (ref 30.0–36.0)
MCV: 95.1 fL (ref 80.0–100.0)
Platelets: 194 10*3/uL (ref 150–400)
RBC: 3.46 MIL/uL — ABNORMAL LOW (ref 3.87–5.11)
RDW: 14 % (ref 11.5–15.5)
WBC: 5.4 10*3/uL (ref 4.0–10.5)
nRBC: 0 % (ref 0.0–0.2)

## 2022-03-25 LAB — CBG MONITORING, ED: Glucose-Capillary: 98 mg/dL (ref 70–99)

## 2022-03-25 MED ORDER — APIXABAN 2.5 MG PO TABS: 2.5000 mg | ORAL_TABLET | Freq: Two times a day (BID) | ORAL | Status: DC

## 2022-03-25 MED ORDER — APIXABAN 5 MG PO TABS: 5.0000 mg | ORAL_TABLET | Freq: Two times a day (BID) | ORAL | Status: DC

## 2022-03-25 MED ORDER — SODIUM CHLORIDE 0.9% FLUSH
3.0000 mL | Freq: Two times a day (BID) | INTRAVENOUS | Status: DC
  Administered 2022-03-25 – 2022-03-29 (×10): 3 mL via INTRAVENOUS

## 2022-03-25 NOTE — ED Notes (Addendum)
Pt given water and graham crackers per request.

## 2022-03-25 NOTE — Assessment & Plan Note (Addendum)
-  continue Eliquis-will change to lower 2.'5mg'$  BID dose given age and lower weight -pt has Tikosyn and diltiazem on record but does not have update med list at this time. Will continue once we can verify.

## 2022-03-25 NOTE — Plan of Care (Signed)
   Dr. Johney Frame discussed with EP-- recommended either resuming tikosyn or transitioning to amiodarone. Patient has missed 2 doses of tikosyn at this time. 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 Discussed with family and patient risks of arrhythmia with tikosyn. Discussed amiodarone side effect and toxicities, and that these usually occurred with long term use of the medication. Family and patient willing to transition to amiodarone.   Patient reports frequent falls recently. PT and OT to see. Nephew also requested social work consult to discuss possible medication assistance and assisted living.   As patient has had recent falls and has positive orthostatic vital signs, will discontinue diltiazem at discharge.   Margie Billet, PA-C 03/25/2022 3:43 PM

## 2022-03-25 NOTE — Assessment & Plan Note (Addendum)
-  stable. Not in exacerbation.  -w/ nocturnal hypoxemia on 2L at bedtime

## 2022-03-25 NOTE — Assessment & Plan Note (Signed)
-  stable on CT maxillofacial. Very edematous but no vision changes.
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
## 2022-03-25 NOTE — ED Notes (Signed)
No answer from previous page to attending provider, 2nd page sent.

## 2022-03-25 NOTE — ED Notes (Signed)
Patient provided with toothbrush, toothpaste, comb, deodorant and lotion, patient denies other need at this time.
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
## 2022-03-25 NOTE — ED Notes (Signed)
This NT ambulated the client around the room per discharge instructions. Patient was very unsteady. The patient stumbled and I had to catch her. Patient is back and bed. The patient stated "I don't know why they think I am 58, I am 16." RN and MD notified.
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
## 2022-03-25 NOTE — ED Notes (Signed)
Ice pack applied to patient's left eye
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
## 2022-03-25 NOTE — Assessment & Plan Note (Signed)
-  incidental findings of spiculated subpleural RLL pulmonology nodule. Pt does report pain in that area of her chest intermittently and has remote hx of breast cancer. Recommend following CT chest or PET-CT in 3 months.

## 2022-03-25 NOTE — Progress Notes (Signed)
Echocardiogram 2D Echocardiogram has been performed.  Oneal Deputy Clemmie Buelna RDCS 03/25/2022, 10:57 AM

## 2022-03-25 NOTE — ED Notes (Signed)
On-call provider responded back to page, writer notified of worsening hematoma surrounding left eye with no decrease in alertness. Provider states he will d/c morning eloquis and order SCD for DVT prophylaxis.

## 2022-03-25 NOTE — ED Notes (Signed)
Got patient ice for her eye

## 2022-03-25 NOTE — ED Notes (Signed)
Provider paged to inquire about worsening hematoma surrounding eye.

## 2022-03-25 NOTE — Assessment & Plan Note (Addendum)
-  Qtc of 512 but no significant electrolyte abnormality. She does have Tioksyn on medication list. Checking echo and will repeat EKG in the morning. -avoid QT prolongation medication

## 2022-03-26 DIAGNOSIS — R911 Solitary pulmonary nodule: Secondary | ICD-10-CM | POA: Diagnosis not present

## 2022-03-26 DIAGNOSIS — R2681 Unsteadiness on feet: Secondary | ICD-10-CM | POA: Diagnosis not present

## 2022-03-26 DIAGNOSIS — R55 Syncope and collapse: Secondary | ICD-10-CM | POA: Diagnosis not present

## 2022-03-26 DIAGNOSIS — H05232 Hemorrhage of left orbit: Secondary | ICD-10-CM | POA: Diagnosis not present

## 2022-03-26 DIAGNOSIS — J42 Unspecified chronic bronchitis: Secondary | ICD-10-CM | POA: Diagnosis not present

## 2022-03-26 LAB — COMPREHENSIVE METABOLIC PANEL
ALT: 35 U/L (ref 0–44)
AST: 26 U/L (ref 15–41)
Albumin: 3.5 g/dL (ref 3.5–5.0)
Alkaline Phosphatase: 74 U/L (ref 38–126)
Anion gap: 9 (ref 5–15)
BUN: 14 mg/dL (ref 8–23)
CO2: 26 mmol/L (ref 22–32)
Calcium: 9.4 mg/dL (ref 8.9–10.3)
Chloride: 104 mmol/L (ref 98–111)
Creatinine, Ser: 0.85 mg/dL (ref 0.44–1.00)
GFR, Estimated: >60 mL/min (ref >60)
Glucose, Bld: 98 mg/dL (ref 70–99)
Potassium: 4.1 mmol/L (ref 3.5–5.1)
Sodium: 139 mmol/L (ref 135–145)
Total Bilirubin: 0.3 mg/dL (ref 0.3–1.2)
Total Protein: 6.4 g/dL — ABNORMAL LOW (ref 6.5–8.1)

## 2022-03-26 LAB — CBC
HCT: 34.1 % — ABNORMAL LOW (ref 36.0–46.0)
Hemoglobin: 10.9 g/dL — ABNORMAL LOW (ref 12.0–15.0)
MCH: 30.5 pg (ref 26.0–34.0)
MCHC: 32 g/dL (ref 30.0–36.0)
MCV: 95.5 fL (ref 80.0–100.0)
Platelets: 176 10*3/uL (ref 150–400)
RBC: 3.57 MIL/uL — ABNORMAL LOW (ref 3.87–5.11)
RDW: 14.6 % (ref 11.5–15.5)
WBC: 4.4 10*3/uL (ref 4.0–10.5)
nRBC: 0 % (ref 0.0–0.2)

## 2022-03-26 MED ORDER — LOSARTAN POTASSIUM 50 MG PO TABS
50.0000 mg | ORAL_TABLET | Freq: Every day | ORAL | Status: DC
  Administered 2022-03-26 – 2022-03-29 (×4): 50 mg via ORAL
  Filled 2022-03-26 (×4): qty 1

## 2022-03-26 MED ORDER — AMIODARONE HCL 200 MG PO TABS
200.0000 mg | ORAL_TABLET | Freq: Every day | ORAL | Status: DC
  Administered 2022-03-26 – 2022-03-29 (×4): 200 mg via ORAL
  Filled 2022-03-26 (×4): qty 1

## 2022-03-26 MED ORDER — ATORVASTATIN CALCIUM 10 MG PO TABS
20.0000 mg | ORAL_TABLET | Freq: Every day | ORAL | Status: DC
  Administered 2022-03-26 – 2022-03-28 (×3): 20 mg via ORAL
  Filled 2022-03-26 (×3): qty 2

## 2022-03-26 MED ORDER — VENLAFAXINE HCL 75 MG PO TABS: 75.0000 mg | ORAL_TABLET | Freq: Two times a day (BID) | ORAL | Status: DC

## 2022-03-26 MED ORDER — VENLAFAXINE HCL 75 MG PO TABS
75.0000 mg | ORAL_TABLET | Freq: Every day | ORAL | Status: DC
  Administered 2022-03-27 – 2022-03-28 (×2): 75 mg via ORAL
  Filled 2022-03-26 (×3): qty 1

## 2022-03-26 MED ORDER — ACETAMINOPHEN 500 MG PO TABS
500.0000 mg | ORAL_TABLET | Freq: Four times a day (QID) | ORAL | Status: DC | PRN
  Administered 2022-03-26 – 2022-03-27 (×2): 500 mg via ORAL
  Filled 2022-03-26 (×2): qty 1

## 2022-03-26 MED ORDER — CLONAZEPAM 1 MG PO TABS
1.0000 mg | ORAL_TABLET | Freq: Every day | ORAL | Status: DC
  Administered 2022-03-26 – 2022-03-28 (×3): 1 mg via ORAL
  Filled 2022-03-26 (×3): qty 1

## 2022-03-26 MED ORDER — HYDRALAZINE HCL 20 MG/ML IJ SOLN
10.0000 mg | INTRAMUSCULAR | Status: DC | PRN
  Administered 2022-03-26 – 2022-03-27 (×2): 10 mg via INTRAVENOUS
  Filled 2022-03-26 (×2): qty 1

## 2022-03-26 MED ORDER — METOPROLOL SUCCINATE ER 50 MG PO TB24
50.0000 mg | ORAL_TABLET | Freq: Every day | ORAL | Status: DC
  Administered 2022-03-26 – 2022-03-29 (×4): 50 mg via ORAL
  Filled 2022-03-26 (×4): qty 1

## 2022-03-26 MED ORDER — ALBUTEROL SULFATE (2.5 MG/3ML) 0.083% IN NEBU: 2.5000 mg | INHALATION_SOLUTION | RESPIRATORY_TRACT | Status: DC | PRN

## 2022-03-26 MED ORDER — VENLAFAXINE HCL 75 MG PO TABS
150.0000 mg | ORAL_TABLET | Freq: Every day | ORAL | Status: DC
  Administered 2022-03-27 – 2022-03-29 (×3): 150 mg via ORAL
  Filled 2022-03-26 (×3): qty 2

## 2022-03-26 MED ORDER — FAMOTIDINE 20 MG PO TABS
20.0000 mg | ORAL_TABLET | Freq: Every day | ORAL | Status: DC
  Administered 2022-03-26 – 2022-03-28 (×3): 20 mg via ORAL
  Filled 2022-03-26 (×3): qty 1
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
## 2022-03-26 NOTE — Progress Notes (Signed)
1916-Patients BP was 191/67 (101) and patient complaining of pain to the left side of her face and pain to the right side rated as a 6 out of 10. RN paged on call [facility] to receive orders for BP and pain meds,    1941-Received scheduled meds for BP; along with PRNs (see mar)
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
## 2022-03-26 NOTE — Plan of Care (Incomplete)
Plan discussed with EP. Decided on amiodarone. Can do amiodarone 200 mg daily. Jtc is not > 500 ms. She is off [place]. Would hold eliquis until she follows up with left eye hematoma.
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
## 2022-03-27 ENCOUNTER — Encounter: Payer: Self-pay | Admitting: *Deleted

## 2022-03-27 ENCOUNTER — Ambulatory Visit: Payer: Self-pay | Admitting: *Deleted

## 2022-03-27 DIAGNOSIS — R55 Syncope and collapse: Secondary | ICD-10-CM | POA: Diagnosis not present

## 2022-03-27 LAB — TYPE AND SCREEN
ABO/RH(D): O POS
Antibody Screen: POSITIVE
Donor AG Type: NEGATIVE
Donor AG Type: NEGATIVE
Unit division: 0
Unit division: 0

## 2022-03-27 LAB — BPAM RBC
Blood Product Expiration Date: 202401252359
Blood Product Expiration Date: 202401252359
Unit Type and Rh: 5100
Unit Type and Rh: 5100

## 2022-03-27 MED ORDER — LIDOCAINE 5 % EX PTCH
1.0000 | MEDICATED_PATCH | CUTANEOUS | Status: AC
  Administered 2022-03-27: 1 via TRANSDERMAL
  Filled 2022-03-27: qty 1
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
## 2022-03-27 NOTE — Patient Outreach (Signed)
  Care Coordination   Follow Up Visit Note   03/27/2022  Name: Amanda Palmer MRN: [id number] DOB: March 23, 1933  Amanda Palmer is a 89 y.o. year old female who sees [name], [name], MD for primary care. I spoke with patient's niece, Amanda Palmer by phone today.  What matters to the patients health and wellness today?  Receive Assistance Applying for Medicaid and [place].    Goals Addressed               This Visit's Progress     Receive Assistance Applying for Medicaid and [place]. (pt-stated)   On track     Care Coordination Interventions:  Problem Solving Interventions Activated. Task-Centered Solutions Revised.   Solution-Focused Strategies Implemented. Active Listening & Reflection Utilized.  Verbalization of Feelings Encouraged. Emotional Support Provided. Caregiver Stress Acknowledged. Caregiver Resources Reviewed. Self-Enrollment in Caregiver Support Group Emphasized. Crisis Support Information, Agencies & Resources Discussed. Higher Level of Care Options ([place], [place]) Reviewed with Niece & Consideration Encouraged. Encouraged Patient's Niece to Assist Patient with Re-Applying for Medicaid, through [facility] [phone]), Under New Medicaid Expansion Guidelines. Reviewed the Following Information & Applications with Niece & Encouraged Assistance with Application Completion & Submission:       ~ 2023 Medicaid Tips       ~ Medicaid Application       ~ How to Apply for Medicaid On-Line       ~ [place] Instructions       ~ [place] Application       ~ [place] Provider List CSW Collaboration with Primary Care Provider, Dr. Allyn Kenner (# [phone]), Via Secure Chat Message in [place], to [place], Per [employer], Due to [employer].       SDOH assessments and interventions completed:  Yes.  Care Coordination Interventions:  Yes, provided.   Follow up plan: Follow up call scheduled for 04/17/2022 at 11:15 am.   Encounter Outcome:  Pt. Visit Completed.   Nat Christen, BSW, MSW, LCSW  Licensed Education officer, [facility]  Mailing [place] [address], [place],  [address] Physical Address-[address], [place],  [address] Toll Free Main # [phone] Fax # [phone] Cell # [phone] [email or web address]

## 2022-03-27 NOTE — Patient Instructions (Signed)
Visit Information  Thank you for taking time to visit with me today. Please don't hesitate to contact me if I can be of assistance to you.   Following are the goals we discussed today:   Goals Addressed               This Visit's Progress     Receive Assistance Applying for Medicaid and [place]. (pt-stated)   On track     Care Coordination Interventions:  Problem Solving Interventions Activated. Task-Centered Solutions Revised.   Solution-Focused Strategies Implemented. Active Listening & Reflection Utilized.  Verbalization of Feelings Encouraged. Emotional Support Provided. Caregiver Stress Acknowledged. Caregiver Resources Reviewed. Self-Enrollment in Caregiver Support Group Emphasized. Crisis Support Information, Agencies & Resources Discussed. Higher Level of Care Options ([place], [place]) Reviewed with Niece & Consideration Encouraged. Encouraged Patient's Niece to Assist Patient with Re-Applying for Medicaid, through The [place] [phone]), Under New Medicaid Expansion Guidelines. Reviewed the Following Information & Applications with Niece & Encouraged Assistance with Application Completion & Submission:       ~ 2023 Medicaid Tips       ~ Medicaid Application       ~ How to Apply for Medicaid On-Line       ~ [place] Instructions       ~ [place] Application       ~ [place] Provider List CSW Collaboration with Primary Care Provider, Dr. Allyn Kenner (# [phone]), Via Secure Chat Message in [place], to [place], Per [employer], Due to [employer].       Our next appointment is by telephone on 04/17/2022 at 11:15 am.  Please call the care guide team at [phone] if you need to cancel or reschedule your appointment.   If you are  experiencing a Mental Health or [place] or need someone to talk to, please call the Suicide and Crisis Lifeline: 988 call the [place] National Suicide Prevention Lifeline: 240 445 5366 or TTY: 347-698-5118 TTY 262 839 5267) to talk to a trained counselor call 1-800-273-TALK (toll free, 24 hour hotline) go to [facility] [address] [place] [phone]) call the [place]: [phone] call 911  Patient verbalizes understanding of instructions and care plan provided today and agrees to view in [place]. Active MyChart status and patient understanding of how to access instructions and care plan via MyChart confirmed with patient.     Telephone follow up appointment with care management team member scheduled for:  04/17/2022 at 11:15 am.   Nat Christen, BSW, MSW, [place]  Licensed Clinical Social Worker  [place]  [place]. [address], [place], [place] [address] Physical Address-[address], [place], [place] [address] Toll Free Main # [phone] Fax # [phone] Cell # [phone] [email or web address]
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
## 2022-03-28 DIAGNOSIS — J42 Unspecified chronic bronchitis: Secondary | ICD-10-CM | POA: Diagnosis not present

## 2022-03-28 LAB — GLUCOSE, CAPILLARY: Glucose-Capillary: 95 mg/dL (ref 70–99)

## 2022-03-28 MED ORDER — AMIODARONE HCL 200 MG PO TABS: 200.0000 mg | ORAL_TABLET | Freq: Every day | ORAL | Status: DC
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
## 2022-03-28 NOTE — TOC Progression Note (Signed)
Transition of Care [facility]) - Progression Note    Patient Details  Name: Amanda Palmer MRN: [id number] Date of Birth: [birth year]  Transition of Care [facility]) CM/SW [place], [place] Phone Number: 03/28/2022, 10:43 AM  Clinical Narrative:     [facility] is reviewing- waiting on determination and if they will have a bed available.   Thurmond Butts, MSW, LCSW Clinical Social Worker    Expected Discharge Plan: Skilled Nursing Facility Barriers to Discharge: Ship broker, Continued Medical Work up, SNF Pending bed offer  Expected Discharge Plan and Services In-house Referral: Clinical Social Work                                             Social Determinants of Health (SDOH) Interventions SDOH Screenings   Food Insecurity: No Food Insecurity (03/13/2022)  Housing: Low Risk  (03/13/2022)  Transportation Needs: No Transportation Needs (03/13/2022)  Utilities: Not At Risk (03/13/2022)  Alcohol Screen: Low Risk  (03/13/2022)  Depression (PHQ2-9): Low Risk  (03/13/2022)  Financial Resource Strain: Low Risk  (03/13/2022)  Physical Activity: Inactive (03/13/2022)  Social Connections: Moderately Integrated (03/13/2022)  Stress: No Stress Concern Present (03/13/2022)  Tobacco Use: Low Risk  (03/27/2022)    Readmission Risk Interventions     No data to display

## 2022-03-28 NOTE — Progress Notes (Signed)
Mobility Specialist Progress Note   03/28/22 1642  Mobility  Activity Ambulated with assistance in hallway  Level of Assistance Contact guard assist, steadying assist  Assistive Device Front wheel walker  Distance Ambulated (ft) 420 ft  Activity Response Tolerated well  Mobility Referral Yes  $Mobility charge 1 Mobility   Pre Mobility: 73 HR, 129/60 BP, 95% SpO2 on RA During Mobility: 111 HR, 147/63 BP, 91% SpO2 on RA  Post Mobility: 82 HR, 159/58 BP, 99% SpO2 on RA   Received pt in bed having trouble hearing me but no complaints and agreeable to mobility. Pt was asymptomatic throughout ambulation and returned to room w/o fault. Once laying down pt felt SOB, encouraged PLB x10 and pt felt better. Left call bell in reach and bet alarm on.  Holland Falling Mobility Specialist Please contact via SecureChat or  Rehab office at [phone]

## 2022-03-28 NOTE — TOC Progression Note (Signed)
Transition of Care [facility]) - Progression Note    Patient Details  Name: Amanda Palmer MRN: [id number] Date of Birth: 02-21-1934  Transition of Care [facility]) CM/SW [place], [place] Phone Number: 03/28/2022, 2:55 PM  Clinical Narrative:     CSW Called pt's niece and provided SNF bed offers with medicare star ratings. [facility] did not offer a bed. Alternatively niece has preference for [facility]. CSW explained [facility] process and anticipated DC as early as tomorrow if [facility] is approved.   CSW confirmed with [facility] they can admit pt once [facility] is approved.   TOC CMA will start insurance auth today.    Expected Discharge Plan: Skilled Nursing Facility Barriers to Discharge: Ship broker, Continued Medical Work up, SNF Pending bed offer  Expected Discharge Plan and Services In-house Referral: Clinical Social Work                                             Social Determinants of Health (SDOH) Interventions SDOH Screenings   Food Insecurity: No Food Insecurity (03/13/2022)  Housing: Low Risk  (03/13/2022)  Transportation Needs: No Transportation Needs (03/13/2022)  Utilities: Not At Risk (03/13/2022)  Alcohol Screen: Low Risk  (03/13/2022)  Depression (PHQ2-9): Low Risk  (03/13/2022)  Financial Resource Strain: Low Risk  (03/13/2022)  Physical Activity: Inactive (03/13/2022)  Social Connections: Moderately Integrated (03/13/2022)  Stress: No Stress Concern Present (03/13/2022)  Tobacco Use: Low Risk  (03/27/2022)    Readmission Risk Interventions     No data to display

## 2022-03-28 NOTE — NC FL2 (Signed)
[place] LEVEL OF CARE FORM     IDENTIFICATION  Patient Name: Amanda Palmer Birthdate: 12-Apr-1933 Sex: female Admission Date (Current Location): 03/24/2022  [facility] and [place] Number:  [employer] and Address:  [facility] [place], [place] [address], [place], [place] [address]      Provider Number: [id number]  Attending Physician Name and Address:  Caren Griffins, MD  Relative Name and Phone Number:       Current Level of Care: Hospital Recommended Level of Care: [place] Prior Approval Number:    Date Approved/Denied:   PASRR Number: [id number]  Discharge Plan: SNF    Current Diagnoses: Patient Active Problem List   Diagnosis Date Noted   Prolonged QT interval 03/25/2022   Lung nodule 03/25/2022   Periorbital hematoma of left eye 03/25/2022   Dysphagia 01/30/2022   Dizziness 08/09/2021   Persistent atrial fibrillation ([place]) 05/09/2020   Secondary hypercoagulable state ([place]) 05/09/2020   Abnormal CT of the chest 07/06/2019   GERD (gastroesophageal reflux disease) 10/22/2016   Hiatal hernia 10/22/2016   H/O adenomatous polyp of colon 10/22/2016   Productive cough 03/29/2015   Dyspnea 02/23/2015   Trimalleolar fracture of ankle, closed 05/31/2014   Syncope and collapse 05/31/2014   Scalp laceration 05/31/2014   ARF (acute renal failure) ([place]) 05/31/2014   Syncope 05/31/2014   COPD (chronic obstructive pulmonary disease) ([place]) 05/22/2014   COPD exacerbation ([place]) 05/22/2014   HTN (hypertension) 05/22/2014   Arrhythmia 05/17/2014    Orientation RESPIRATION BLADDER Height & Weight     Self, Time, Situation, Place  Normal External catheter, Incontinent Weight: 130 lb 15.3 oz (59.4 kg) Height:  5' (152.4 cm)  BEHAVIORAL SYMPTOMS/MOOD NEUROLOGICAL BOWEL NUTRITION STATUS      Continent Diet (please see discharge summary)  AMBULATORY STATUS COMMUNICATION OF NEEDS Skin     Verbally Skin abrasions  (Erthema/rednese left eye & face)                       Personal Care Assistance Level of Assistance  Bathing, Feeding, Dressing Bathing Assistance: Limited assistance Feeding assistance: Independent Dressing Assistance: Limited assistance     Functional Limitations Info  Sight, Hearing, Speech Sight Info: Adequate Hearing Info: Adequate Speech Info: Adequate    SPECIAL CARE FACTORS FREQUENCY  PT (By licensed PT), OT (By licensed OT)     PT Frequency: 5x per week OT Frequency: 5x per week            Contractures Contractures Info: Not present    Additional Factors Info  Code Status, Allergies Code Status Info: FULL Allergies Info: Morphine & related,penicillins and sulfa antibiotics           Current Medications (03/28/2022):  This is the current hospital active medication list Current Facility-Administered Medications  Medication Dose Route Frequency Provider Last Rate Last Admin   acetaminophen (TYLENOL) tablet 500 mg  500 mg Oral Q6H PRN Donne Hazel, MD   500 mg at 03/27/22 1928   albuterol (PROVENTIL) (2.5 MG/3ML) 0.083% nebulizer solution 2.5 mg  2.5 mg Nebulization Q4H PRN Donne Hazel, MD       amiodarone (PACERONE) tablet 200 mg  200 mg Oral Daily Margie Billet, PA-C   200 mg at 03/28/22 7867   atorvastatin (LIPITOR) tablet 20 mg  20 mg Oral QHS Donne Hazel, MD   20 mg at 03/27/22 2132   clonazePAM (KLONOPIN) tablet 1  mg  1 mg Oral QHS Donne Hazel, MD   1 mg at 03/27/22 2132   famotidine (PEPCID) tablet 20 mg  20 mg Oral QHS Donne Hazel, MD   20 mg at 03/27/22 2131   hydrALAZINE (APRESOLINE) injection 10 mg  10 mg Intravenous Q4H PRN Donne Hazel, MD   10 mg at 03/27/22 0756   losartan (COZAAR) tablet 50 mg  50 mg Oral Daily Donne Hazel, MD   50 mg at 03/28/22 5189   metoprolol succinate (TOPROL-XL) 24 hr tablet 50 mg  50 mg Oral Daily Donne Hazel, MD   50 mg at 03/28/22 8421   sodium chloride flush (NS) 0.9 % injection  3 mL  3 mL Intravenous Q12H Tu, Ching T, DO   3 mL at 03/28/22 0834   venlafaxine (EFFEXOR) tablet 150 mg  150 mg Oral Q breakfast Donne Hazel, MD   150 mg at 03/28/22 0834   venlafaxine (EFFEXOR) tablet 75 mg  75 mg Oral Q supper Donne Hazel, MD   75 mg at 03/27/22 1716     Discharge Medications: Please see discharge summary for a list of discharge medications.  Relevant Imaging Results:  Relevant Lab Results:   Additional Information SSN [id number]  Vinie Sill, LCSW

## 2022-03-28 NOTE — Care Management Important Message (Signed)
Important Message  Patient Details  Name: Amanda Palmer MRN: [id number] Date of Birth: January 01, 1934   Medicare Important Message Given:  Yes     Hannah Beat 03/28/2022, 1:10 PM
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
## 2022-03-29 DIAGNOSIS — I10 Essential (primary) hypertension: Secondary | ICD-10-CM | POA: Diagnosis not present

## 2022-03-29 DIAGNOSIS — R41841 Cognitive communication deficit: Secondary | ICD-10-CM | POA: Diagnosis not present

## 2022-03-29 DIAGNOSIS — Y92410 Unspecified street and highway as the place of occurrence of the external cause: Secondary | ICD-10-CM | POA: Diagnosis not present

## 2022-03-29 DIAGNOSIS — R69 Illness, unspecified: Secondary | ICD-10-CM | POA: Diagnosis not present

## 2022-03-29 DIAGNOSIS — F419 Anxiety disorder, unspecified: Secondary | ICD-10-CM | POA: Diagnosis not present

## 2022-03-29 DIAGNOSIS — E7849 Other hyperlipidemia: Secondary | ICD-10-CM | POA: Diagnosis not present

## 2022-03-29 DIAGNOSIS — R9431 Abnormal electrocardiogram [ECG] [EKG]: Secondary | ICD-10-CM | POA: Diagnosis not present

## 2022-03-29 DIAGNOSIS — Z743 Need for continuous supervision: Secondary | ICD-10-CM | POA: Diagnosis not present

## 2022-03-29 DIAGNOSIS — R531 Weakness: Secondary | ICD-10-CM | POA: Diagnosis not present

## 2022-03-29 DIAGNOSIS — G4736 Sleep related hypoventilation in conditions classified elsewhere: Secondary | ICD-10-CM | POA: Diagnosis not present

## 2022-03-29 DIAGNOSIS — W1839XA Other fall on same level, initial encounter: Secondary | ICD-10-CM | POA: Diagnosis not present

## 2022-03-29 DIAGNOSIS — F4323 Adjustment disorder with mixed anxiety and depressed mood: Secondary | ICD-10-CM | POA: Diagnosis not present

## 2022-03-29 DIAGNOSIS — J449 Chronic obstructive pulmonary disease, unspecified: Secondary | ICD-10-CM | POA: Diagnosis not present

## 2022-03-29 DIAGNOSIS — I959 Hypotension, unspecified: Secondary | ICD-10-CM | POA: Diagnosis not present

## 2022-03-29 DIAGNOSIS — R42 Dizziness and giddiness: Secondary | ICD-10-CM | POA: Diagnosis not present

## 2022-03-29 DIAGNOSIS — R2689 Other abnormalities of gait and mobility: Secondary | ICD-10-CM | POA: Diagnosis not present

## 2022-03-29 DIAGNOSIS — J42 Unspecified chronic bronchitis: Secondary | ICD-10-CM | POA: Diagnosis not present

## 2022-03-29 DIAGNOSIS — K219 Gastro-esophageal reflux disease without esophagitis: Secondary | ICD-10-CM | POA: Diagnosis not present

## 2022-03-29 DIAGNOSIS — M6281 Muscle weakness (generalized): Secondary | ICD-10-CM | POA: Diagnosis not present

## 2022-03-29 DIAGNOSIS — R55 Syncope and collapse: Secondary | ICD-10-CM | POA: Diagnosis not present

## 2022-03-29 DIAGNOSIS — Z23 Encounter for immunization: Secondary | ICD-10-CM | POA: Diagnosis present

## 2022-03-29 DIAGNOSIS — H05232 Hemorrhage of left orbit: Secondary | ICD-10-CM | POA: Diagnosis not present

## 2022-03-29 DIAGNOSIS — F32A Depression, unspecified: Secondary | ICD-10-CM | POA: Diagnosis not present

## 2022-03-29 DIAGNOSIS — Z9981 Dependence on supplemental oxygen: Secondary | ICD-10-CM | POA: Diagnosis not present

## 2022-03-29 DIAGNOSIS — M1990 Unspecified osteoarthritis, unspecified site: Secondary | ICD-10-CM | POA: Diagnosis not present

## 2022-03-29 DIAGNOSIS — I4819 Other persistent atrial fibrillation: Secondary | ICD-10-CM | POA: Diagnosis not present

## 2022-03-29 DIAGNOSIS — Z7401 Bed confinement status: Secondary | ICD-10-CM | POA: Diagnosis not present

## 2022-03-29 LAB — BASIC METABOLIC PANEL
Anion gap: 6 (ref 5–15)
BUN: 26 mg/dL — ABNORMAL HIGH (ref 8–23)
CO2: 26 mmol/L (ref 22–32)
Calcium: 9.2 mg/dL (ref 8.9–10.3)
Chloride: 104 mmol/L (ref 98–111)
Creatinine, Ser: 0.91 mg/dL (ref 0.44–1.00)
GFR, Estimated: >60 mL/min (ref >60)
Glucose, Bld: 99 mg/dL (ref 70–99)
Potassium: 4.1 mmol/L (ref 3.5–5.1)
Sodium: 136 mmol/L (ref 135–145)

## 2022-03-29 LAB — CBC
HCT: 35.4 % — ABNORMAL LOW (ref 36.0–46.0)
Hemoglobin: 11.6 g/dL — ABNORMAL LOW (ref 12.0–15.0)
MCH: 30.2 pg (ref 26.0–34.0)
MCHC: 32.8 g/dL (ref 30.0–36.0)
MCV: 92.2 fL (ref 80.0–100.0)
Platelets: 256 10*3/uL (ref 150–400)
RBC: 3.84 MIL/uL — ABNORMAL LOW (ref 3.87–5.11)
RDW: 14.4 % (ref 11.5–15.5)
WBC: 5.4 10*3/uL (ref 4.0–10.5)
nRBC: 0 % (ref 0.0–0.2)

## 2022-03-29 LAB — GLUCOSE, CAPILLARY: Glucose-Capillary: 114 mg/dL — ABNORMAL HIGH (ref 70–99)

## 2022-03-29 LAB — MAGNESIUM: Magnesium: 2 mg/dL (ref 1.7–2.4)

## 2022-03-29 MED ORDER — CLONAZEPAM 1 MG PO TABS: 1.0000 mg | ORAL_TABLET | Freq: Every day | ORAL | 0 refills | Status: DC

## 2022-03-29 MED ORDER — APIXABAN 5 MG PO TABS: 5.0000 mg | ORAL_TABLET | Freq: Two times a day (BID) | ORAL | 6 refills | Status: DC

## 2022-03-29 NOTE — TOC Transition Note (Signed)
Transition of Care [facility]) - CM/SW Discharge Note   Patient Details  Name: Amanda Palmer MRN: [id number] Date of Birth: 02/19/1934  Transition of Care [facility]) CM/SW Contact:  Vinie Sill, LCSW Phone Number: 03/29/2022, 11:29 AM   Clinical Narrative:     Patient will Discharge to: [facility] Discharge Date: 03/29/22 Family Notified: Niece , Amanda Palmer Transport By: Corey Harold  Per MD patient is ready for discharge. RN, patient, and facility notified of discharge. Discharge Summary sent to facility. RN given number for [phone]. Ambulance transport requested for patient.   Clinical Social Worker signing off.  Thurmond Butts, MSW, LCSW Clinical Social Worker     Final next level of care: Skilled Nursing Facility Barriers to Discharge: Barriers Resolved   Patient Goals and CMS Choice      Discharge Placement                Patient chooses bed at:  [facility]) Patient to be transferred to facility by: [place] Name of family member notified: Niece, Amanda Palmer Patient and family notified of of transfer: 03/29/22  Discharge Plan and Services Additional resources added to the After Visit Summary for   In-house Referral: Clinical Social Work                                   Social Determinants of Health (SDOH) Interventions SDOH Screenings   Food Insecurity: No Food Insecurity (03/13/2022)  Housing: Low Risk  (03/13/2022)  Transportation Needs: No Transportation Needs (03/13/2022)  Utilities: Not At Risk (03/13/2022)  Alcohol Screen: Low Risk  (03/13/2022)  Depression (PHQ2-9): Low Risk  (03/13/2022)  Financial Resource Strain: Low Risk  (03/13/2022)  Physical Activity: Inactive (03/13/2022)  Social Connections: Moderately Integrated (03/13/2022)  Stress: No Stress Concern Present (03/13/2022)  Tobacco Use: Low Risk  (03/27/2022)     Readmission Risk Interventions     No data to display
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
## 2022-03-31 DIAGNOSIS — J449 Chronic obstructive pulmonary disease, unspecified: Secondary | ICD-10-CM | POA: Diagnosis not present

## 2022-03-31 DIAGNOSIS — I959 Hypotension, unspecified: Secondary | ICD-10-CM | POA: Diagnosis not present

## 2022-03-31 DIAGNOSIS — R531 Weakness: Secondary | ICD-10-CM | POA: Diagnosis not present

## 2022-03-31 DIAGNOSIS — E7849 Other hyperlipidemia: Secondary | ICD-10-CM | POA: Diagnosis not present

## 2022-03-31 DIAGNOSIS — K219 Gastro-esophageal reflux disease without esophagitis: Secondary | ICD-10-CM | POA: Diagnosis not present

## 2022-04-05 ENCOUNTER — Ambulatory Visit: Payer: Medicare HMO | Admitting: Student

## 2022-04-14 DIAGNOSIS — R69 Illness, unspecified: Secondary | ICD-10-CM | POA: Diagnosis not present

## 2022-04-16 ENCOUNTER — Ambulatory Visit: Payer: Medicare HMO | Admitting: Nurse Practitioner

## 2022-04-17 ENCOUNTER — Encounter: Payer: Self-pay | Admitting: *Deleted

## 2022-04-17 ENCOUNTER — Other Ambulatory Visit: Payer: Self-pay | Admitting: *Deleted

## 2022-04-17 ENCOUNTER — Ambulatory Visit: Payer: Self-pay | Admitting: *Deleted

## 2022-04-17 ENCOUNTER — Other Ambulatory Visit: Payer: Self-pay | Admitting: Cardiology

## 2022-04-17 DIAGNOSIS — I10 Essential (primary) hypertension: Secondary | ICD-10-CM

## 2022-04-17 MED ORDER — AMIODARONE HCL 200 MG PO TABS: 200.0000 mg | ORAL_TABLET | Freq: Every day | ORAL | 0 refills | Status: DC

## 2022-04-17 NOTE — Telephone Encounter (Addendum)
Per [place]-can refill amio, earlier appt is fine

## 2022-04-17 NOTE — Patient Instructions (Signed)
Visit Information  Thank you for taking time to visit with me today. Please don't hesitate to contact me if I can be of assistance to you.   Following are the goals we discussed today:   Goals Addressed               This Visit's Progress     Receive Assistance Applying for Medicaid and [place]. (pt-stated)   On track     Care Coordination Interventions:  Active Listening & Reflection Utilized.  Verbalization of Feelings Encouraged. Emotional Support Provided. Feelings of Frustration & Anxiety Validated. Problem Solving Interventions Implemented. Task-Centered Solutions Activated.   Solution-Focused Strategies Employed. Caregiver Stress Acknowledged. Caregiver Resources Reviewed. Self-Enrollment in Caregiver Support Group of Interest Emphasized, from List Provided. Acceptance & Commitment Therapy Introduced. Brief Cognitive Behavioral Therapy Performed. Client-Centered Therapy Initiated. CSW Collaboration with Niece, [name] to [employer] Address ([email or web address]), to Email the Following List of Information & Applications, As Patient Has Misplaced Previous Documents, Mailed Directly to Her Home:  ~ 2023 Medicaid Tips  ~ Medicaid Application  ~ How to Apply for Medicaid On-Line  ~ [place] Instructions  ~ [place] Application  ~ [place] Provider List CSW Collaboration with Niece, [name] to [employer] Receipt of Information & Applications, Emailed ([email or web address]) on 04/17/2022. CSW Collaboration with Niece, [name] to [employer] with Completion of Application for Medicaid & Submission to [facility] [phone]), for Processing. CSW Collaboration with Niece, [name] to [employer] with Completion of Application for [place] to Primary Care Provider, Dr. Shepard [facility] [phone]), for Review & Signature. CSW Collaboration with Receptionist at [facility] Provider, Dr. She[facility] [phone]), to Request Completed & [place] Application Be Faxed to [facility] [phone]), for Processing. CSW Collaboration with Regulatory affairs officer, Dr. Moishe Spice Farotimi with Primary Care Provider, Dr. She[facility] [phone]), to Request Assistance Obtaining Heart Medications, Per Niece, Jaynie Crumble Request.       Our next appointment is by telephone on 05/01/2022 at 9:45 am.  Please call the care guide team at [phone] if you need to cancel or reschedule your appointment.   If you are experiencing a Mental Health or [place] or need someone to talk to, please call the Suicide and Crisis Lifeline: 988 call the [place] National Suicide Prevention Lifeline: 704-372-5424 or TTY: 438-135-9291 TTY 253 886 8651) to talk to a trained counselor call 1-800-273-TALK (toll free, 24 hour hotline) go to [facility] [address] [place] [phone]) call the [place]: [phone] call 911  Patient verbalizes understanding of instructions and care plan provided today and agrees to view in [place]. Active MyChart status and patient understanding of how to access instructions and care plan via MyChart confirmed with patient.     Telephone follow up appointment with care management team member scheduled for:  05/01/2022 at 9:45 am.  Nat Christen, BSW, MSW, [place]  Licensed Clinical Social Worker  [place]  Mailing [place]. [address], [place], [place] [address] Physical Address[address] [place], [place] [address] Toll Free Main # [phone] Fax # [phone] Cell # [phone] [email or web address]

## 2022-04-17 NOTE — Patient Outreach (Addendum)
[place] Coordinator follow up. Verified in [facility] Mrs. Mcmeans discharged from [facility] on 04/16/22. Mrs. Trosper currently active with [facility] Care Coordination team's LCSW.   Secure message sent to [place] worker to inquire about home health arrangements and if PCP appointment was scheduled.   Will also refer to [facility] RN for care coordination as benefit of health plan and Primary Care Provider.   Addendum:  Update from [place] social worker. Follow up PCP appointment with Dr. [name] is scheduled for 04/18/22. Home health agency is [facility].   Marthenia Rolling, MSN, RN,BSN [place] Acute Care Coordinator [phone] (Direct dial)

## 2022-04-17 NOTE — Patient Outreach (Signed)
  Care Coordination   Follow Up Visit Note   04/17/2022  Name: Amanda Palmer MRN: [id number] DOB: 1933/07/15  Amanda Palmer is a 89 y.o. year old female who sees [name], [name], MD for primary care. I spoke with patient's niece, [name] by phone today.  What matters to the patients health and wellness today?   Receive Assistance Applying for Medicaid and [place].   Goals Addressed               This Visit's Progress     Receive Assistance Applying for Medicaid and [place]. (pt-stated)   On track     Care Coordination Interventions:  Active Listening & Reflection Utilized.  Verbalization of Feelings Encouraged. Emotional Support Provided. Feelings of Frustration & Anxiety Validated. Problem Solving Interventions Implemented. Task-Centered Solutions Activated.   Solution-Focused Strategies Employed. Caregiver Stress Acknowledged. Caregiver Resources Reviewed. Self-Enrollment in Caregiver Support Group of Interest Emphasized, from List Provided. Acceptance & Commitment Therapy Introduced. Brief Cognitive Behavioral Therapy Performed. Client-Centered Therapy Initiated. CSW Collaboration with Niece, [name] to [employer] Address ([email or web address]), to Email the Following List of [place], As Patient Has Misplaced Previous Documents, Mailed Directly to Her Home:  ~ 2023 Medicaid Tips  ~ Medicaid Application  ~ How to Apply for Medicaid On-Line  ~ [place] Instructions  ~ [place] Application  ~ [place] Provider List CSW Collaboration with Niece, [name] to [employer] Receipt of [place], Emailed ([email or web address]) on 04/17/2022. CSW Collaboration with Niece, [name] to [employer] with Completion of Application for Medicaid & Submission to [facility] [phone]), for  Processing. CSW Collaboration with Niece, [name] to [employer] with Completion of Application for [place] to Primary Care Provider, Dr. [facility] [phone]), for Review & Signature. CSW Collaboration with Receptionist at [facility] Provider, Dr. Shepard [facility] [phone]), to Request Completed & [place] Application Be Faxed to [facility] [phone]), for Processing. CSW Collaboration with Regulatory affairs officer, Dr. Moishe Spice Farotimi with Primary Care Provider, Dr. She[facility] [phone]), to Request Assistance Obtaining Heart Medications, Per Niece, Jaynie Crumble Request.       SDOH assessments and interventions completed:  Yes.  Care Coordination Interventions:  Yes, provided.   Follow up plan: Follow up call scheduled for 05/01/2022 at 9:45 am.  Encounter Outcome:  Pt. Visit Completed.   Nat Christen, BSW, MSW, LCSW  Licensed Education officer, [facility]  Mailing [place], [place], [place] [address] Physical Address-[address], [place],  [address] Toll Free Main # [phone] Fax # [phone] Cell # [phone] [email or web address]

## 2022-04-17 NOTE — Telephone Encounter (Signed)
*  STAT* If patient is at the pharmacy, call can be transferred to refill team.   1. Which medications need to be refilled? (please list name of each medication and dose if known) amiodarone (PACERONE) 200 MG tablet   2. Which pharmacy/location (including street and city if local pharmacy) is medication to be sent to? [facility] - [place], [place] - [place] [address]   3. Do they need a 30 day or 90 day supply? 30 day

## 2022-04-18 ENCOUNTER — Ambulatory Visit: Payer: Self-pay | Admitting: *Deleted

## 2022-04-18 ENCOUNTER — Telehealth: Payer: Self-pay | Admitting: *Deleted

## 2022-04-18 ENCOUNTER — Encounter: Payer: Self-pay | Admitting: *Deleted

## 2022-04-18 DIAGNOSIS — I951 Orthostatic hypotension: Secondary | ICD-10-CM | POA: Diagnosis not present

## 2022-04-18 DIAGNOSIS — I482 Chronic atrial fibrillation, unspecified: Secondary | ICD-10-CM | POA: Diagnosis not present

## 2022-04-18 DIAGNOSIS — M818 Other osteoporosis without current pathological fracture: Secondary | ICD-10-CM | POA: Diagnosis not present

## 2022-04-18 DIAGNOSIS — I739 Peripheral vascular disease, unspecified: Secondary | ICD-10-CM | POA: Diagnosis not present

## 2022-04-18 DIAGNOSIS — R911 Solitary pulmonary nodule: Secondary | ICD-10-CM | POA: Diagnosis not present

## 2022-04-18 DIAGNOSIS — Z7901 Long term (current) use of anticoagulants: Secondary | ICD-10-CM | POA: Diagnosis not present

## 2022-04-18 DIAGNOSIS — Z96641 Presence of right artificial hip joint: Secondary | ICD-10-CM | POA: Diagnosis not present

## 2022-04-18 DIAGNOSIS — Z8673 Personal history of transient ischemic attack (TIA), and cerebral infarction without residual deficits: Secondary | ICD-10-CM | POA: Diagnosis not present

## 2022-04-18 DIAGNOSIS — Z7951 Long term (current) use of inhaled steroids: Secondary | ICD-10-CM | POA: Diagnosis not present

## 2022-04-18 DIAGNOSIS — C50919 Malignant neoplasm of unspecified site of unspecified female breast: Secondary | ICD-10-CM | POA: Diagnosis not present

## 2022-04-18 DIAGNOSIS — Z9981 Dependence on supplemental oxygen: Secondary | ICD-10-CM | POA: Diagnosis not present

## 2022-04-18 DIAGNOSIS — Z9181 History of falling: Secondary | ICD-10-CM | POA: Diagnosis not present

## 2022-04-18 DIAGNOSIS — J449 Chronic obstructive pulmonary disease, unspecified: Secondary | ICD-10-CM | POA: Diagnosis not present

## 2022-04-18 DIAGNOSIS — D649 Anemia, unspecified: Secondary | ICD-10-CM | POA: Diagnosis not present

## 2022-04-18 DIAGNOSIS — I10 Essential (primary) hypertension: Secondary | ICD-10-CM | POA: Diagnosis not present

## 2022-04-18 DIAGNOSIS — I4819 Other persistent atrial fibrillation: Secondary | ICD-10-CM | POA: Diagnosis not present

## 2022-04-18 DIAGNOSIS — R600 Localized edema: Secondary | ICD-10-CM | POA: Diagnosis not present

## 2022-04-18 DIAGNOSIS — K219 Gastro-esophageal reflux disease without esophagitis: Secondary | ICD-10-CM | POA: Diagnosis not present

## 2022-04-18 DIAGNOSIS — H05239 Hemorrhage of unspecified orbit: Secondary | ICD-10-CM | POA: Diagnosis not present

## 2022-04-18 DIAGNOSIS — R55 Syncope and collapse: Secondary | ICD-10-CM | POA: Diagnosis not present

## 2022-04-18 DIAGNOSIS — R69 Illness, unspecified: Secondary | ICD-10-CM | POA: Diagnosis not present

## 2022-04-18 DIAGNOSIS — Z96653 Presence of artificial knee joint, bilateral: Secondary | ICD-10-CM | POA: Diagnosis not present

## 2022-04-18 NOTE — Patient Outreach (Signed)
Care Coordination   Follow Up Visit Note   04/18/2022  Name: Amanda Palmer MRN: [id number] DOB: 02/20/1934  Amanda Palmer is a 89 y.o. year old female who sees Amanda Palmer, Amanda Areola, MD for primary care. I spoke with Amanda Palmer and niece, Amanda Palmer by phone today.  What matters to the patients health and wellness today?  Receive Assistance Applying for Medicaid and [place].   Goals Addressed               This Visit's Progress     Receive Assistance Applying for Medicaid and [place]. (pt-stated)   On track     Care Coordination Interventions:  Active Listening & Reflection Utilized.  Verbalization of Feelings Encouraged. Emotional Support Provided. Feelings of Frustration & Anxiety Validated. Problem Solving Interventions Implemented. Task-Centered Solutions Activated.   Solution-Focused Strategies Employed. Caregiver Stress Acknowledged. Caregiver Resources Reviewed. Self-Enrollment in Caregiver Support Group of Interest Emphasized, from List Provided. Acceptance & Commitment Therapy Introduced. Brief Cognitive Behavioral Therapy Performed. Client-Centered Therapy Initiated. CSW Collaboration with Niece, Amanda Palmer to [employer] Address ([email or web address]), to Email the Following List of Information & Applications, As Patient Has Misplaced Previous Documents, Mailed Directly to Her Home:  ~ 2023 Medicaid Tips  ~ Medicaid Application  ~ How to Apply for Medicaid On-Line  ~ [place] Instructions  ~ [place] Application  ~ [place] Provider List CSW Collaboration with Niece, Amanda Palmer to [employer] Receipt of [place], Emailed ([email or web address]) on 04/17/2022. CSW Collaboration with Niece, Amanda Palmer to [employer] with Completion of Application for Medicaid & Submission to [facility] [place] [phone]),  for Processing. CSW Collaboration with Niece, Amanda Palmer to [employer] with Completion of Application for [place] to Primary Care Provider, Amanda Palmer Office [phone]), for Review & Signature. CSW Collaboration with Receptionist at [facility] Provider, Amanda Palmer Office [phone]), to Request Completed & [place] Application Be Faxed to [facility] [phone]), for Processing. CSW Collaboration with Regulatory affairs officer, Amanda Palmer with Primary Care Provider, Amanda Palmer Office [phone]), to Request Assistance Obtaining Heart Medications, Per Niece, Amanda Palmer Request. CSW Collaboration with Representative from [facility] [phone]), to Request Female Certified Nursing Assistant to [place], at Earliest Availability. CSW Collaboration with Niece, Amanda Palmer to Request Her Presence While Female Certified Nursing Assistant from [facility] [phone]) [place] to [place] with Patient on 04/18/2022 & All Home Visits, Moving Forward. CSW Collaboration with Amanda Palmer, Nurse for Primary Care Provider, Amanda Palmer (# [phone]), Via HIPAA Complaint Voicemail Message, to Encourage Completion of [place] Patient to [place] through [facility] [phone]), During Scheduled Follow-Up Appointment on 04/18/2022 at 3:00 pm.       SDOH assessments and interventions completed:  Yes.  Care Coordination Interventions:  Yes, provided.   Follow up plan: Follow up call scheduled for 05/01/2022 at 9:45 am.  Encounter Outcome:  Pt. Visit Completed.   Amanda Palmer, BSW, MSW, LCSW  Licensed Education officer, [facility]  Mailing [place] [address] [place], [place] [address] Physical  Address-[address], [place], [place] [address] Toll Free Main # [phone] Fax # [phone] Cell # [phone] [email or web address]

## 2022-04-18 NOTE — Patient Instructions (Signed)
Visit Information  Thank you for taking time to visit with me today. Please don't hesitate to contact me if I can be of assistance to you.   Following are the goals we discussed today:   Goals Addressed               This Visit's Progress     Receive Assistance Applying for Medicaid and [place]. (pt-stated)   On track     Care Coordination Interventions:  Active Listening & Reflection Utilized.  Verbalization of Feelings Encouraged. Emotional Support Provided. Feelings of Frustration & Anxiety Validated. Problem Solving Interventions Implemented. Task-Centered Solutions Activated.   Solution-Focused Strategies Employed. Caregiver Stress Acknowledged. Caregiver Resources Reviewed. Self-Enrollment in Caregiver Support Group of Interest Emphasized, from List Provided. Acceptance & Commitment Therapy Introduced. Brief Cognitive Behavioral Therapy Performed. Client-Centered Therapy Initiated. CSW Collaboration with Niece, [name] to [employer] Address ([email or web address]), to Email the Following List of Information & Applications, As Patient Has Misplaced Previous Documents, Mailed Directly to Her Home:  ~ 2023 Medicaid Tips  ~ Medicaid Application  ~ How to Apply for Medicaid On-Line  ~ [place] Instructions  ~ [place] Application  ~ [place] Provider List CSW Collaboration with Niece, [name] to [employer] Receipt of Information & Applications, Emailed ([email or web address]) on 04/17/2022. CSW Collaboration with Niece, [name] to [employer] with Completion of Application for Medicaid & Submission to The [place] [phone]), for Processing. CSW Collaboration with Niece, [name] to [employer] with Completion of Application for [place] to Primary Care Provider, Dr. Shepard General Office [phone]), for Review & Signature. CSW Collaboration with Receptionist at [facility] Provider, Dr. Shepard General Office [phone]), to Request Completed & [place] Application Be Faxed to [facility] [phone]), for Processing. CSW Collaboration with Regulatory affairs officer, Dr. Moishe Spice [place] with Primary Care Provider, Dr. Shepard General Office [phone]), to Request Assistance Obtaining Heart Medications, Per Niece, Jaynie Crumble Request. CSW Collaboration with Representative from [facility] [phone]), to Request Female Certified Nursing Assistant to [place], at Earliest Availability. CSW Collaboration with Niece, [name] to Request Her Presence While Female Certified Nursing Assistant from [facility] [phone]) [place] to [place] with Patient on 04/18/2022 & All Home Visits, Moving Forward. CSW Collaboration with Blake Divine, Nurse for Primary Care Provider, Dr. Allyn Kenner (# [phone]), Via HIPAA Complaint Voicemail Message, to Encourage Completion of [place] Patient to [place] through [facility] [phone]), During Scheduled Follow-Up Appointment on 04/18/2022 at 3:00 pm.       Our next appointment is by telephone on 05/01/2022 at 9:45 am.  Please call the care guide team at [phone] if you need to cancel or reschedule your appointment.   If you are experiencing a Mental Health or [place] or need someone to talk to, please call the Suicide and Crisis Lifeline: 988 call the [place] National Suicide Prevention Lifeline: 865-471-7892 or TTY: 262-720-6622 TTY 671 284 3487) to talk to a trained counselor call 1-800-273-TALK (toll free, 24 hour hotline) go to [facility] [address] [place] [phone]) call the [place]: [phone] call 911  Patient verbalizes understanding of instructions and care plan provided today and agrees to view in [place]. Active MyChart status and patient  understanding of how to access instructions and care plan via MyChart confirmed with patient.     Telephone follow up appointment with care management team member scheduled for:  05/01/2022 at 9:45 am.  Nat Christen, BSW, MSW, [place]  Licensed Clinical Social Worker  [place]  Mailing [place], [place], [place] [address] Physical Address-[address] [place], [place] [address] Toll Free Main # [phone] Fax # [phone] Cell # [phone] [email or web address]

## 2022-04-18 NOTE — Progress Notes (Signed)
  Care Coordination   Note   04/18/2022 Name: Amanda Palmer MRN: [id number] DOB: [birth year]  Amanda Palmer is a 89 y.o. year old female who sees [name], [name], MD for primary care. I reached out to [name] by phone today to offer care coordination services.  [name] was given information about Care Coordination services today including:   The Care Coordination services include support from the care team which includes your Nurse Coordinator, Clinical Social Worker, or Pharmacist.  The Care Coordination team is here to help remove barriers to the health concerns and goals most important to you. Care Coordination services are voluntary, and the patient may decline or stop services at any time by request to their care team member.   Care Coordination Consent Status: Patient agreed to services and verbal consent obtained.   Follow up plan:  Telephone appointment with care coordination team member scheduled for:  04/22/22  Encounter Outcome:  Pt. Scheduled  [place]  Direct Dial: [phone]

## 2022-04-18 NOTE — Patient Instructions (Addendum)
Visit Information  Thank you for taking time to visit with me today. Please don't hesitate to contact me if I can be of assistance to you.   Following are the goals we discussed today:   Goals Addressed               This Visit's Progress     Patient Stated     managing and coordination of home health aide services [facility]) (pt-stated)   Not on track     Care Coordination Interventions: Evaluation of current treatment plan related to voiced concern with a female aide for home health bath care and patient's adherence to plan as established by provider Collaborated with [facility] SW, [facility] staff, Candace and [place] regarding availability of use of a female aide. [facility] does not have any female aides at this time. Candace had discussed this with the patient and she had agreed to the female aide services (as she had one in the hospital) Confirmed no available female [facility] aide at this time. Encouraged [facility] to keep patient active for services. [facility] SW to speak with patient and her family  Discussed plans with patient for ongoing care management follow up and provided patient with direct contact information for care management team Confirmed patient had been closed with [facility] home care in the past         Our next appointment is by telephone on 04/22/22 at 3 pm  Please call the care guide team at [phone] if you need to cancel or reschedule your appointment.   If you are experiencing a Mental Health or [place] or need someone to talk to, please call the Suicide and Crisis Lifeline: 988 call the [place] National Suicide Prevention Lifeline: (303) 149-9709 or TTY: (220)121-6779 TTY 815-506-0606) to talk to a trained counselor call 1-800-273-TALK (toll free, 24 hour hotline) call the [facility]: [phone] call 911   Patient verbalizes understanding of instructions and care plan provided today and agrees to view in [place]. Active  MyChart status and patient understanding of how to access instructions and care plan via MyChart confirmed with patient.     The patient has been provided with contact information for the care management team and has been advised to call with any health related questions or concerns.    Cardale Dorer L. Lavina Hamman, RN, BSN, CCM,  [facility] number [phone], [place],  [place]

## 2022-04-18 NOTE — Patient Outreach (Signed)
  Care Coordination   Follow Up Visit Note   04/18/2022 Name: Amanda Palmer MRN: [id number] DOB: [birth year]  Amanda Palmer is a 89 y.o. year old female who sees [name], [name], MD for primary care. I spoke with  Amanda Palmer by phone today.  What matters to the patients health and wellness today?  Use of [facility] female aide for care concerns      Goals Addressed               This Visit's Progress     Patient Stated     managing and coordination of home health aide services [facility]) (pt-stated)   Not on track     Care Coordination Interventions: Evaluation of current treatment plan related to voiced concern with a female aide for home health bath care and patient's adherence to plan as established by provider Collaborated with [facility] SW, [facility] staff, Candace and [place] regarding availability of use of a female aide. [facility] does not have any female aides at this time. Candace had discussed this with the patient and she had agreed to the female aide services (as she had one in the hospital) Confirmed no available female [facility] aide at this time. Encouraged [facility] to keep patient active for services. [facility] SW to speak with patient and her family  Discussed plans with patient for ongoing care management follow up and provided patient with direct contact information for care management team Confirmed patient had been closed with [facility] home care in the past         SDOH assessments and interventions completed:  No     Care Coordination Interventions:  Yes, provided   Follow up plan: Follow up call scheduled for 04/22/22    Encounter Outcome:  Pt. Visit Completed   Eloy Fehl L. Lavina Hamman, RN, BSN, [place] Coordinator Office number [phone]

## 2022-04-21 DIAGNOSIS — J449 Chronic obstructive pulmonary disease, unspecified: Secondary | ICD-10-CM | POA: Diagnosis not present

## 2022-04-22 ENCOUNTER — Encounter: Payer: Self-pay | Admitting: *Deleted

## 2022-04-22 ENCOUNTER — Ambulatory Visit: Payer: Self-pay | Admitting: *Deleted

## 2022-04-22 DIAGNOSIS — J449 Chronic obstructive pulmonary disease, unspecified: Secondary | ICD-10-CM | POA: Diagnosis not present

## 2022-04-22 DIAGNOSIS — Z9981 Dependence on supplemental oxygen: Secondary | ICD-10-CM | POA: Diagnosis not present

## 2022-04-22 DIAGNOSIS — Z7901 Long term (current) use of anticoagulants: Secondary | ICD-10-CM | POA: Diagnosis not present

## 2022-04-22 DIAGNOSIS — Z7951 Long term (current) use of inhaled steroids: Secondary | ICD-10-CM | POA: Diagnosis not present

## 2022-04-22 DIAGNOSIS — I10 Essential (primary) hypertension: Secondary | ICD-10-CM | POA: Diagnosis not present

## 2022-04-22 DIAGNOSIS — C50919 Malignant neoplasm of unspecified site of unspecified female breast: Secondary | ICD-10-CM | POA: Diagnosis not present

## 2022-04-22 DIAGNOSIS — Z8673 Personal history of transient ischemic attack (TIA), and cerebral infarction without residual deficits: Secondary | ICD-10-CM | POA: Diagnosis not present

## 2022-04-22 DIAGNOSIS — I4819 Other persistent atrial fibrillation: Secondary | ICD-10-CM | POA: Diagnosis not present

## 2022-04-22 DIAGNOSIS — Z9181 History of falling: Secondary | ICD-10-CM | POA: Diagnosis not present

## 2022-04-22 DIAGNOSIS — I951 Orthostatic hypotension: Secondary | ICD-10-CM | POA: Diagnosis not present

## 2022-04-22 DIAGNOSIS — Z96641 Presence of right artificial hip joint: Secondary | ICD-10-CM | POA: Diagnosis not present

## 2022-04-22 DIAGNOSIS — Z96653 Presence of artificial knee joint, bilateral: Secondary | ICD-10-CM | POA: Diagnosis not present

## 2022-04-22 DIAGNOSIS — R69 Illness, unspecified: Secondary | ICD-10-CM | POA: Diagnosis not present

## 2022-04-22 NOTE — Patient Instructions (Addendum)
Visit Information  Thank you for taking time to visit with me today. Please don't hesitate to contact me if I can be of assistance to you.   Transportation Resources- includes  SKAT website- [email or web address] [place] offers Resources for Living options Dial 1 [phone]   Following are the goals we discussed today:   Goals Addressed               This Visit's Progress     Patient Stated     managing and coordination of home health aide services [facility]) (pt-stated)   On track     Care Coordination Interventions: Discussed plans with patient for ongoing care management follow up and provided patient with direct contact information for care management team Confirmed patient has been visited by home health staff per patient and niece       Medical Transportation [facility]) (pt-stated)   Not on track     Care Coordination Interventions: Evaluation of current treatment plan related to her [place] transportation resources  and patient's adherence to plan as established by provider Collaborated with [employer] customer service staff [place] for living program staff regarding patient loctal transportation resources Provided patient with Resources for Living program  educational materials related to [place] member resources  Reviewed scheduled/upcoming provider appointments including with pcp and specialists Discussed plans with patient for ongoing care management follow up and provided patient with direct contact information for care management team Screening for signs and symptoms of depression related to chronic disease state  Assessed social determinant of health barriers Confirmed transportation assistance from her niece and nephew as needed Outreach with her to [employer] to confirm with Bianca [phone]) that she does not have a transportation benefit on her plan Outreached to the program  offered by [place] called Resources for living at [phone] Outreached to [place], niece to review information during this outreach. Confirmed patient does have memory concerns but has not been evaluated by a neurologist at this time Letter sent to patient related to [place] next appointment is by telephone on 05/21/22 at 3 pm  Please call the care guide team at [phone] if you need to cancel or reschedule your appointment.   If you are experiencing a Mental Health or [place] or need someone to talk to, please call the Suicide and Crisis Lifeline: 988 call the [place] National Suicide Prevention Lifeline: 636-548-3637 or TTY: (240)190-9052 TTY (530)303-5415) to talk to a trained counselor call 1-800-273-TALK (toll free, 24 hour hotline) call the [facility]: [phone] call 911   Patient verbalizes understanding of instructions and care plan provided today and agrees to view in [place]. Active MyChart status and patient understanding of how to access instructions and care plan via MyChart confirmed with patient.     The patient has been provided with contact information for the care management team and has been advised to call with any health related questions or concerns.    Cobi Aldape L. Lavina Hamman, RN, BSN, [place] Coordinator Office number [phone]
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
## 2022-04-23 ENCOUNTER — Encounter: Payer: Self-pay | Admitting: Nurse Practitioner

## 2022-04-23 ENCOUNTER — Ambulatory Visit: Payer: Medicare HMO | Attending: Nurse Practitioner | Admitting: Nurse Practitioner

## 2022-04-23 VITALS — BP 135/80 | HR 68 | Ht 60.0 in | Wt 135.2 lb

## 2022-04-23 DIAGNOSIS — Z7901 Long term (current) use of anticoagulants: Secondary | ICD-10-CM | POA: Diagnosis not present

## 2022-04-23 DIAGNOSIS — I10 Essential (primary) hypertension: Secondary | ICD-10-CM | POA: Diagnosis not present

## 2022-04-23 DIAGNOSIS — C50919 Malignant neoplasm of unspecified site of unspecified female breast: Secondary | ICD-10-CM | POA: Diagnosis not present

## 2022-04-23 DIAGNOSIS — I444 Left anterior fascicular block: Secondary | ICD-10-CM

## 2022-04-23 DIAGNOSIS — Z9181 History of falling: Secondary | ICD-10-CM | POA: Diagnosis not present

## 2022-04-23 DIAGNOSIS — I4891 Unspecified atrial fibrillation: Secondary | ICD-10-CM

## 2022-04-23 DIAGNOSIS — R55 Syncope and collapse: Secondary | ICD-10-CM | POA: Diagnosis not present

## 2022-04-23 DIAGNOSIS — J449 Chronic obstructive pulmonary disease, unspecified: Secondary | ICD-10-CM

## 2022-04-23 DIAGNOSIS — I38 Endocarditis, valve unspecified: Secondary | ICD-10-CM

## 2022-04-23 DIAGNOSIS — E785 Hyperlipidemia, unspecified: Secondary | ICD-10-CM | POA: Diagnosis not present

## 2022-04-23 DIAGNOSIS — I480 Paroxysmal atrial fibrillation: Secondary | ICD-10-CM

## 2022-04-23 DIAGNOSIS — I5032 Chronic diastolic (congestive) heart failure: Secondary | ICD-10-CM | POA: Diagnosis not present

## 2022-04-23 DIAGNOSIS — R918 Other nonspecific abnormal finding of lung field: Secondary | ICD-10-CM

## 2022-04-23 DIAGNOSIS — Z8673 Personal history of transient ischemic attack (TIA), and cerebral infarction without residual deficits: Secondary | ICD-10-CM | POA: Diagnosis not present

## 2022-04-23 DIAGNOSIS — Z9981 Dependence on supplemental oxygen: Secondary | ICD-10-CM | POA: Diagnosis not present

## 2022-04-23 DIAGNOSIS — Z96653 Presence of artificial knee joint, bilateral: Secondary | ICD-10-CM | POA: Diagnosis not present

## 2022-04-23 DIAGNOSIS — Z87898 Personal history of other specified conditions: Secondary | ICD-10-CM

## 2022-04-23 DIAGNOSIS — I951 Orthostatic hypotension: Secondary | ICD-10-CM | POA: Diagnosis not present

## 2022-04-23 DIAGNOSIS — R69 Illness, unspecified: Secondary | ICD-10-CM | POA: Diagnosis not present

## 2022-04-23 DIAGNOSIS — Z7951 Long term (current) use of inhaled steroids: Secondary | ICD-10-CM | POA: Diagnosis not present

## 2022-04-23 DIAGNOSIS — Z96641 Presence of right artificial hip joint: Secondary | ICD-10-CM | POA: Diagnosis not present

## 2022-04-23 DIAGNOSIS — I4819 Other persistent atrial fibrillation: Secondary | ICD-10-CM | POA: Diagnosis not present

## 2022-04-23 NOTE — Patient Instructions (Addendum)
Medication Instructions:  Continue all current medications.   Labwork: none  Testing/Procedures: none  Follow-Up: As scheduled with Dr. Harl Bowie   Any Other Special Instructions Will Be Listed Below (If Applicable).   If you need a refill on your cardiac medications before your next appointment, please call your pharmacy.
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
## 2022-04-24 ENCOUNTER — Encounter: Payer: Self-pay | Admitting: Nurse Practitioner

## 2022-04-24 DIAGNOSIS — Z7901 Long term (current) use of anticoagulants: Secondary | ICD-10-CM | POA: Diagnosis not present

## 2022-04-24 DIAGNOSIS — Z7951 Long term (current) use of inhaled steroids: Secondary | ICD-10-CM | POA: Diagnosis not present

## 2022-04-24 DIAGNOSIS — C50919 Malignant neoplasm of unspecified site of unspecified female breast: Secondary | ICD-10-CM | POA: Diagnosis not present

## 2022-04-24 DIAGNOSIS — Z8673 Personal history of transient ischemic attack (TIA), and cerebral infarction without residual deficits: Secondary | ICD-10-CM | POA: Diagnosis not present

## 2022-04-24 DIAGNOSIS — Z96641 Presence of right artificial hip joint: Secondary | ICD-10-CM | POA: Diagnosis not present

## 2022-04-24 DIAGNOSIS — I4819 Other persistent atrial fibrillation: Secondary | ICD-10-CM | POA: Diagnosis not present

## 2022-04-24 DIAGNOSIS — R69 Illness, unspecified: Secondary | ICD-10-CM | POA: Diagnosis not present

## 2022-04-24 DIAGNOSIS — Z9181 History of falling: Secondary | ICD-10-CM | POA: Diagnosis not present

## 2022-04-24 DIAGNOSIS — Z96653 Presence of artificial knee joint, bilateral: Secondary | ICD-10-CM | POA: Diagnosis not present

## 2022-04-24 DIAGNOSIS — Z9981 Dependence on supplemental oxygen: Secondary | ICD-10-CM | POA: Diagnosis not present

## 2022-04-24 DIAGNOSIS — I10 Essential (primary) hypertension: Secondary | ICD-10-CM | POA: Diagnosis not present

## 2022-04-24 DIAGNOSIS — I951 Orthostatic hypotension: Secondary | ICD-10-CM | POA: Diagnosis not present

## 2022-04-24 DIAGNOSIS — J449 Chronic obstructive pulmonary disease, unspecified: Secondary | ICD-10-CM | POA: Diagnosis not present

## 2022-04-25 DIAGNOSIS — I951 Orthostatic hypotension: Secondary | ICD-10-CM | POA: Diagnosis not present

## 2022-04-25 DIAGNOSIS — R69 Illness, unspecified: Secondary | ICD-10-CM | POA: Diagnosis not present

## 2022-04-25 DIAGNOSIS — Z7901 Long term (current) use of anticoagulants: Secondary | ICD-10-CM | POA: Diagnosis not present

## 2022-04-25 DIAGNOSIS — Z7951 Long term (current) use of inhaled steroids: Secondary | ICD-10-CM | POA: Diagnosis not present

## 2022-04-25 DIAGNOSIS — Z8673 Personal history of transient ischemic attack (TIA), and cerebral infarction without residual deficits: Secondary | ICD-10-CM | POA: Diagnosis not present

## 2022-04-25 DIAGNOSIS — C50919 Malignant neoplasm of unspecified site of unspecified female breast: Secondary | ICD-10-CM | POA: Diagnosis not present

## 2022-04-25 DIAGNOSIS — I4819 Other persistent atrial fibrillation: Secondary | ICD-10-CM | POA: Diagnosis not present

## 2022-04-25 DIAGNOSIS — Z96653 Presence of artificial knee joint, bilateral: Secondary | ICD-10-CM | POA: Diagnosis not present

## 2022-04-25 DIAGNOSIS — Z96641 Presence of right artificial hip joint: Secondary | ICD-10-CM | POA: Diagnosis not present

## 2022-04-25 DIAGNOSIS — J449 Chronic obstructive pulmonary disease, unspecified: Secondary | ICD-10-CM | POA: Diagnosis not present

## 2022-04-25 DIAGNOSIS — Z9181 History of falling: Secondary | ICD-10-CM | POA: Diagnosis not present

## 2022-04-25 DIAGNOSIS — I10 Essential (primary) hypertension: Secondary | ICD-10-CM | POA: Diagnosis not present

## 2022-04-25 DIAGNOSIS — Z9981 Dependence on supplemental oxygen: Secondary | ICD-10-CM | POA: Diagnosis not present

## 2022-04-26 DIAGNOSIS — Z96641 Presence of right artificial hip joint: Secondary | ICD-10-CM | POA: Diagnosis not present

## 2022-04-26 DIAGNOSIS — I951 Orthostatic hypotension: Secondary | ICD-10-CM | POA: Diagnosis not present

## 2022-04-26 DIAGNOSIS — Z96653 Presence of artificial knee joint, bilateral: Secondary | ICD-10-CM | POA: Diagnosis not present

## 2022-04-26 DIAGNOSIS — Z7951 Long term (current) use of inhaled steroids: Secondary | ICD-10-CM | POA: Diagnosis not present

## 2022-04-26 DIAGNOSIS — Z7901 Long term (current) use of anticoagulants: Secondary | ICD-10-CM | POA: Diagnosis not present

## 2022-04-26 DIAGNOSIS — Z9181 History of falling: Secondary | ICD-10-CM | POA: Diagnosis not present

## 2022-04-26 DIAGNOSIS — Z9981 Dependence on supplemental oxygen: Secondary | ICD-10-CM | POA: Diagnosis not present

## 2022-04-26 DIAGNOSIS — R69 Illness, unspecified: Secondary | ICD-10-CM | POA: Diagnosis not present

## 2022-04-26 DIAGNOSIS — C50919 Malignant neoplasm of unspecified site of unspecified female breast: Secondary | ICD-10-CM | POA: Diagnosis not present

## 2022-04-26 DIAGNOSIS — Z8673 Personal history of transient ischemic attack (TIA), and cerebral infarction without residual deficits: Secondary | ICD-10-CM | POA: Diagnosis not present

## 2022-04-26 DIAGNOSIS — I4819 Other persistent atrial fibrillation: Secondary | ICD-10-CM | POA: Diagnosis not present

## 2022-04-26 DIAGNOSIS — J449 Chronic obstructive pulmonary disease, unspecified: Secondary | ICD-10-CM | POA: Diagnosis not present

## 2022-04-26 DIAGNOSIS — I10 Essential (primary) hypertension: Secondary | ICD-10-CM | POA: Diagnosis not present

## 2022-04-29 DIAGNOSIS — Z96653 Presence of artificial knee joint, bilateral: Secondary | ICD-10-CM | POA: Diagnosis not present

## 2022-04-29 DIAGNOSIS — Z7951 Long term (current) use of inhaled steroids: Secondary | ICD-10-CM | POA: Diagnosis not present

## 2022-04-29 DIAGNOSIS — R69 Illness, unspecified: Secondary | ICD-10-CM | POA: Diagnosis not present

## 2022-04-29 DIAGNOSIS — C50919 Malignant neoplasm of unspecified site of unspecified female breast: Secondary | ICD-10-CM | POA: Diagnosis not present

## 2022-04-29 DIAGNOSIS — Z96641 Presence of right artificial hip joint: Secondary | ICD-10-CM | POA: Diagnosis not present

## 2022-04-29 DIAGNOSIS — I951 Orthostatic hypotension: Secondary | ICD-10-CM | POA: Diagnosis not present

## 2022-04-29 DIAGNOSIS — Z7901 Long term (current) use of anticoagulants: Secondary | ICD-10-CM | POA: Diagnosis not present

## 2022-04-29 DIAGNOSIS — Z8673 Personal history of transient ischemic attack (TIA), and cerebral infarction without residual deficits: Secondary | ICD-10-CM | POA: Diagnosis not present

## 2022-04-29 DIAGNOSIS — I10 Essential (primary) hypertension: Secondary | ICD-10-CM | POA: Diagnosis not present

## 2022-04-29 DIAGNOSIS — Z9181 History of falling: Secondary | ICD-10-CM | POA: Diagnosis not present

## 2022-04-29 DIAGNOSIS — I4819 Other persistent atrial fibrillation: Secondary | ICD-10-CM | POA: Diagnosis not present

## 2022-04-29 DIAGNOSIS — Z9981 Dependence on supplemental oxygen: Secondary | ICD-10-CM | POA: Diagnosis not present

## 2022-04-29 DIAGNOSIS — J449 Chronic obstructive pulmonary disease, unspecified: Secondary | ICD-10-CM | POA: Diagnosis not present

## 2022-04-29 NOTE — Progress Notes (Unsigned)
PCP:  Celene Squibb, MD Primary Cardiologist: Carlyle Dolly, MD Electrophysiologist: Melida Quitter, MD   Amanda Palmer is a 89 y.o. female seen today for Melida Quitter, MD for {VISITTYPE:28148}  Past Medical History:  Diagnosis Date   A-fib [facility])    Anxiety    Arthritis    Cancer ([place])    breast   COPD (chronic obstructive pulmonary disease) ([place])    Depression    GERD (gastroesophageal reflux disease)    Hypertension    TIA (transient ischemic attack)    remote    Current Outpatient Medications  Medication Instructions   acetaminophen (TYLENOL) 500 mg, Oral, Every 6 hours PRN   albuterol (PROVENTIL HFA;VENTOLIN HFA) 108 (90 Base) MCG/ACT inhaler 2 puffs, Inhalation, Every 6 hours PRN   albuterol (PROVENTIL) 2.5 mg, Nebulization, 2 times daily   amiodarone (PACERONE) 200 mg, Oral, Daily   apixaban (ELIQUIS) 5 mg, Oral, 2 times daily   atorvastatin (LIPITOR) 20 mg, Oral, Daily at bedtime   clonazePAM (KLONOPIN) 1 mg, Oral, Daily at bedtime   esomeprazole (NEXIUM) 20 mg, Oral, Daily   furosemide (LASIX) 20 mg, Oral, As needed   guaifenesin (ROBITUSSIN) 100 mg, Oral, 3 times daily PRN   hydrocortisone (ANUSOL-HC) 2.5 % rectal cream 1 Application, Rectal, 2 times daily   losartan (COZAAR) 50 mg, Oral, Daily   Magnesium Oxide 400 mg, Oral, Daily   metoprolol succinate (TOPROL XL) 50 mg, Oral, Daily, Take with or immediately following a meal.   Multiple Vitamin (MULTIVITAMIN WITH MINERALS) TABS tablet 1 tablet, Oral, Daily   OXYGEN 2 L, Inhalation, Continuous   potassium chloride (KLOR-CON) 10 MEQ tablet 20 mEq, Oral, Daily   Respiratory Therapy Supplies (FLUTTER) DEVI 1 Device, Does not apply, 4 times daily   TRELEGY ELLIPTA 100-62.5-25 MCG/ACT AEPB INHALE ONE PUFF BY MOUTH INTO LUNGS DAILY   triamcinolone cream (KENALOG) 0.5 % 1 Application, Topical, 2 times daily   venlafaxine (EFFEXOR) 75-150 mg, Oral, 2 times daily with meals, Takes 2 tablets (150 mg) by  mouth in the morning and take 1 tablet (75 mg) by mouth at night    Physical Exam: There were no vitals filed for this visit.  GEN- NAD. A&O x 3. Normal affect. HEENT: normocephalic, atraumatic Lungs- CTAB, Normal effort Heart- {EPRHYTHM:28826}, No M/G/R Extremities- {EDEMA LEVEL:28147::"No"} peripheral edema. no clubbing or cyanosis; Skin- warm and dry, no rash or lesion  EKG is not ordered today. EKG from 04/23/2022 reviewed which showed NSR at 68 bpm with stable QTc  Additional studies reviewed include: Previous EP notes.   {Select studies to display:26339}  Assessment and Plan:  1. Persistent atrial fibrillation EKG 04/23/22 showed NSR with stable QT  She is not candidate for ablation Continue eliquis for CHA2DS2VASC of at least 8.   BMET and Mg today   2. NICM Volume status stable on exam Echo TEE 05/17/2020 with LVEF 30-35%. Consider repeating in several months of maintaining NSR. Continue losartan and Toprol. Medication titration has been limited by hypotension, poor candidate for Entresto with overall soft pressures.  Could consider low dose spiro if EF remains depressed despite NSR.    3. Secondary Hypercoagulable State (ICD10:  D68.69) The patient is at significant risk for stroke/thromboembolism based upon her CHA2DS2-VASc Score of 8.  Continue Apixaban (Eliquis).  Labs today .   Follow up with Dr. Myles Gip in 6 months to establish  Follow up with [id number] {EPFOLLOW [id number]  Shirley Friar, PA-C  04/29/22 10:32  AM

## 2022-04-30 ENCOUNTER — Encounter: Payer: Self-pay | Admitting: Student

## 2022-04-30 ENCOUNTER — Ambulatory Visit: Payer: Medicare HMO | Attending: Student | Admitting: Student

## 2022-04-30 ENCOUNTER — Ambulatory Visit: Payer: Medicare HMO | Attending: Student

## 2022-04-30 VITALS — BP 142/60 | HR 83 | Ht 60.0 in | Wt 134.0 lb

## 2022-04-30 DIAGNOSIS — I480 Paroxysmal atrial fibrillation: Secondary | ICD-10-CM

## 2022-04-30 DIAGNOSIS — D6869 Other thrombophilia: Secondary | ICD-10-CM

## 2022-04-30 DIAGNOSIS — I5032 Chronic diastolic (congestive) heart failure: Secondary | ICD-10-CM

## 2022-04-30 NOTE — Progress Notes (Unsigned)
Enrolled for [employer] to mail a ZIO XT long term holter monitor to the patients address on file.   Dr. Myles Gip to read.

## 2022-04-30 NOTE — Patient Instructions (Signed)
Medication Instructions:  Your physician recommends that you continue on your current medications as directed. Please refer to the Current Medication list given to you today.  *If you need a refill on your cardiac medications before your next appointment, please call your pharmacy*   Lab Work: TODAY:  CMET, TSH, & FREE T4  If you have labs (blood work) drawn today and your tests are completely normal, you will receive your results only by: [place] (if you have MyChart) OR A paper copy in the mail If you have any lab test that is abnormal or we need to change your treatment, we will call you to review the results.   Testing/Procedures: [place]- Long Term Monitor Instructions  Your physician has requested you wear a ZIO patch monitor for 14 days.  This is a single patch monitor. [employer] supplies one patch monitor per enrollment. Additional stickers are not available. Please do not apply patch if you will be having a Nuclear Stress Test,  Echocardiogram, Cardiac CT, MRI, or Chest Xray during the period you would be wearing the  monitor. The patch cannot be worn during these tests. You cannot remove and re-apply the  ZIO XT patch monitor.  Your ZIO patch monitor will be mailed 3 day USPS to your address on file. It may take 3-5 days  to receive your monitor after you have been enrolled.  Once you have received your monitor, please review the enclosed instructions. Your monitor  has already been registered assigning a specific monitor serial # to you.  Billing and Patient Assistance Program Information  We have supplied [employer] with any of your insurance information on file for billing purposes. [employer] offers a sliding scale Patient Assistance Program for patients that do not have  insurance, or whose insurance does not completely cover the cost of the ZIO monitor.  You must apply for the Patient Assistance Program to qualify for this discounted rate.  To apply, please call  [employer] at [phone], select option 4, select option 2, ask to apply for  Patient Assistance Program. Theodore Demark will ask your household income, and how many people  are in your household. They will quote your out-of-pocket cost based on that information.  [employer] will also be able to set up a 67-month interest-free payment plan if needed.  Applying the monitor   Shave hair from upper left chest.  Hold abrader disc by orange tab. Rub abrader in 40 strokes over the upper left chest as  indicated in your monitor instructions.  Clean area with 4 enclosed alcohol pads. Let dry.  Apply patch as indicated in monitor instructions. Patch will be placed under collarbone on left  side of chest with arrow pointing upward.  Rub patch adhesive wings for 2 minutes. Remove white label marked "1". Remove the white  label marked "2". Rub patch adhesive wings for 2 additional minutes.  While looking in a mirror, press and release button in center of patch. A small green light will  flash 3-4 times. This will be your only indicator that the monitor has been turned on.  Do not shower for the first 24 hours. You may shower after the first 24 hours.  Press the button if you feel a symptom. You will hear a small click. Record Date, Time and  Symptom in the Patient Logbook.  When you are ready to remove the patch, follow instructions on the last 2 pages of Patient  Logbook. Stick patch monitor onto the last page of  Patient Logbook.  Place Patient Logbook in the blue and white box. Use locking tab on box and tape box closed  securely. The blue and white box has prepaid postage on it. Please place it in the mailbox as  soon as possible. Your physician should have your test results approximately 7 days after the  monitor has been mailed back to [facility].  Call [place] at [phone] if you have questions regarding  your ZIO XT patch monitor. Call them immediately if you see an orange  light blinking on your  monitor.  If your monitor falls off in less than 4 days, contact our Monitor department at [phone].  If your monitor becomes loose or falls off after 4 days call [employer] at [phone] for  suggestions on securing your monitor    Follow-Up: At [facility], you and your health needs are our priority.  As part of our continuing mission to provide you with exceptional heart care, we have created designated Provider Care Teams.  These Care Teams include your primary Cardiologist (physician) and Advanced Practice Providers (APPs -  Physician Assistants and Nurse Practitioners) who all work together to provide you with the care you need, when you need it.  We recommend signing up for the patient portal called "MyChart".  Sign up information is provided on this After Visit Summary.  MyChart is used to connect with patients for Virtual Visits (Telemedicine).  Patients are able to view lab/test results, encounter notes, upcoming appointments, etc.  Non-urgent messages can be sent to your provider as well.   To learn more about what you can do with MyChart, go to [email or web address].    Your next appointment:   6 month(s)  Provider:   Doralee Albino, MD    Other Instructions

## 2022-05-01 ENCOUNTER — Encounter: Payer: Self-pay | Admitting: *Deleted

## 2022-05-01 ENCOUNTER — Ambulatory Visit: Payer: Self-pay | Admitting: *Deleted

## 2022-05-01 DIAGNOSIS — Z7901 Long term (current) use of anticoagulants: Secondary | ICD-10-CM | POA: Diagnosis not present

## 2022-05-01 DIAGNOSIS — Z9981 Dependence on supplemental oxygen: Secondary | ICD-10-CM | POA: Diagnosis not present

## 2022-05-01 DIAGNOSIS — Z96641 Presence of right artificial hip joint: Secondary | ICD-10-CM | POA: Diagnosis not present

## 2022-05-01 DIAGNOSIS — R69 Illness, unspecified: Secondary | ICD-10-CM | POA: Diagnosis not present

## 2022-05-01 DIAGNOSIS — I10 Essential (primary) hypertension: Secondary | ICD-10-CM | POA: Diagnosis not present

## 2022-05-01 DIAGNOSIS — Z9181 History of falling: Secondary | ICD-10-CM | POA: Diagnosis not present

## 2022-05-01 DIAGNOSIS — Z7951 Long term (current) use of inhaled steroids: Secondary | ICD-10-CM | POA: Diagnosis not present

## 2022-05-01 DIAGNOSIS — I951 Orthostatic hypotension: Secondary | ICD-10-CM | POA: Diagnosis not present

## 2022-05-01 DIAGNOSIS — Z96653 Presence of artificial knee joint, bilateral: Secondary | ICD-10-CM | POA: Diagnosis not present

## 2022-05-01 DIAGNOSIS — C50919 Malignant neoplasm of unspecified site of unspecified female breast: Secondary | ICD-10-CM | POA: Diagnosis not present

## 2022-05-01 DIAGNOSIS — J449 Chronic obstructive pulmonary disease, unspecified: Secondary | ICD-10-CM | POA: Diagnosis not present

## 2022-05-01 DIAGNOSIS — I4819 Other persistent atrial fibrillation: Secondary | ICD-10-CM | POA: Diagnosis not present

## 2022-05-01 DIAGNOSIS — Z8673 Personal history of transient ischemic attack (TIA), and cerebral infarction without residual deficits: Secondary | ICD-10-CM | POA: Diagnosis not present

## 2022-05-01 LAB — COMPREHENSIVE METABOLIC PANEL
ALT: 14 IU/L (ref 0–32)
AST: 14 IU/L (ref 0–40)
Albumin/Globulin Ratio: 1.5 (ref 1.2–2.2)
Albumin: 3.8 g/dL (ref 3.7–4.7)
Alkaline Phosphatase: 103 IU/L (ref 44–121)
BUN/Creatinine Ratio: 23 (ref 12–28)
BUN: 21 mg/dL (ref 8–27)
Bilirubin Total: 0.3 mg/dL (ref 0.0–1.2)
CO2: 23 mmol/L (ref 20–29)
Calcium: 9.5 mg/dL (ref 8.7–10.3)
Chloride: 106 mmol/L (ref 96–106)
Creatinine, Ser: 0.9 mg/dL (ref 0.57–1.00)
Globulin, Total: 2.6 g/dL (ref 1.5–4.5)
Glucose: 73 mg/dL (ref 70–99)
Potassium: 4.2 mmol/L (ref 3.5–5.2)
Sodium: 144 mmol/L (ref 134–144)
Total Protein: 6.4 g/dL (ref 6.0–8.5)
eGFR: 62 mL/min/{1.73_m2} (ref >59)

## 2022-05-01 LAB — T4, FREE: Free T4: 1.45 ng/dL (ref 0.82–1.77)

## 2022-05-01 LAB — TSH: TSH: 4.2 u[IU]/mL (ref 0.450–4.500)

## 2022-05-01 NOTE — Patient Instructions (Signed)
Visit Information  Thank you for taking time to visit with me today. Please don't hesitate to contact me if I can be of assistance to you.   Following are the goals we discussed today:   Goals Addressed               This Visit's Progress     Receive Assistance Applying for Medicaid and [place]. (pt-stated)   On track     Care Coordination Interventions:  Interventions Today    Flowsheet Row Most Recent Value  Chronic Disease   Chronic disease during today's visit Other, Chronic Obstructive Pulmonary Disease (COPD), Hypertension (HTN), Atrial Fibrillation (AFib)  [Anxiety, Depression, Dizziness, Giddiness, Lack of Transportation & Requires Assistance with Activities of Daily Living.]  General Interventions   General Interventions Discussed/Reviewed General Interventions Discussed, General Interventions Reviewed, Annual Eye Exam, Labs, Durable Medical Equipment (DME), Vaccines, Health Screening, [employer], Doctor Visits, Communication with, Level of Care  [Primary Care Provider]  Labs Hgb A1c annually  Vaccines COVID-19, Flu, Pneumonia, RSV, Shingles  [Encouraged]  Doctor Visits Discussed/Reviewed Doctor Visits Discussed, Doctor Visits Reviewed, Annual Wellness Visits, PCP, Specialist  [Encouraged]  Health Screening Colonoscopy, Mammogram  [Encouraged]  Durable Medical Equipment (DME) Bed side commode, BP Cuff, Oxygen, Walker, Community education officer  PCP/Specialist Visits Compliance with follow-up visit  Communication with PCP/Specialists, RN  Level of Care Adult Daycare, Location manager, Assisted Living, Personal Care Services  Applications Medicaid, Personal Care Services  Exercise Interventions   Exercise Discussed/Reviewed Exercise Discussed, Exercise Reviewed, Physical Activity, Assistive device use and maintanence  [Encouraged]  Physical Activity Discussed/Reviewed Physical Activity Discussed, Physical Activity Reviewed, Types of exercise, Home  Exercise Program (HEP)  [Encouraged]  Education Interventions   Education Provided Provided Engineer, site, Provided Education  Provided Verbal Education On Nutrition, Blood Sugar Monitoring, Mental Health/Coping with Illness, Applications, Exercise, Medication, When to see the doctor, [employer], Engineer, materials, [place] Reviewed, Coping Strategies, Crisis, Anxiety, Depression, Grief and Loss, Suicide  Nutrition Interventions   Nutrition Discussed/Reviewed Nutrition Discussed, Nutrition Reviewed, Adding fruits and vegetables, Increaing proteins, Decreasing salt, Supplmental nutrition  [Encouraged]  Pharmacy Interventions   Pharmacy Dicussed/Reviewed Pharmacy Topics Discussed, Pharmacy Topics Reviewed, Medication Adherence, Affording Medications  Safety Interventions   Safety Discussed/Reviewed Safety Discussed, Safety Reviewed, Fall Risk, Home Safety  Home Safety Assistive Devices, Need for home safety assessment, Refer for community resources  Advanced Directive Interventions   Advanced Directives Discussed/Reviewed Advanced Directives Discussed, Advanced Directives Reviewed     Active Listening & Reflection Utilized.  Verbalization of Feelings Encouraged. Emotional Support Provided. Feelings of Frustration Regarding Loss of Independence Validated. Problem Solving Interventions Implemented. Task-Centered Solutions Activated.   Solution-Focused Strategies Employed. Caregiver Stress Acknowledged. Caregiver Resources Reviewed. Self-Enrollment in Caregiver Support Group of Interest Emphasized. Acceptance & Commitment Therapy Indicated. Cognitive Behavioral Therapy Performed. Client-Centered Therapy Initiated. Confirmed Receipt of The Following List of Information & Applications & Encouraged Completion with Niece, [name]:  ~ 2023  Medicaid Tips  ~ Medicaid Application  ~ How to Apply for Medicaid On-Line  ~ [place] Instructions  ~ [place] Application  ~ [place] Provider List CSW Collaboration with Primary Care Provider, Dr. Allyn Kenner (# [phone]), Via HIPAA Complaint Voicemail Message, to Request Completion of Personal Care Services Application. CSW Collaboration with Receptionist at [facility] Provider, Dr. Shepard General Office [phone]), to  Confirm Receipt of Personal Care Services Application. CSW Collaboration with Receptionist at [facility] Provider, Dr. Shepard General Office [phone]), to Request Completed & [place] Application Be Faxed to [facility] [phone]), for Processing. Continue to [place] through [facility] [phone]).      Our next appointment is by telephone on 05/14/2022 at 11:45 am.  Please call the care guide team at [phone] if you need to cancel or reschedule your appointment.   If you are experiencing a Mental Health or [place] or need someone to talk to, please call the Suicide and Crisis Lifeline: 988 call the [place] National Suicide Prevention Lifeline: 670-247-7977 or TTY: 317-175-9032 TTY 306-646-5223) to talk to a trained counselor call 1-800-273-TALK (toll free, 24 hour hotline) go to [facility] [address], [place] [id number]) call the [place]: [phone] call 911  Patient verbalizes understanding of instructions and care plan provided today and agrees to view in [place]. Active MyChart status and patient understanding of how to access instructions and care plan via MyChart confirmed with patient.     Telephone follow up appointment with care management team member scheduled for: 05/14/2022 at 11:45 am.  Nat Christen, BSW, MSW, [place]  Licensed  Clinical Social Worker  [facility]  Mailing [place]. [address], [place], [place] [address] Physical Address-[address], [place],  [address] Toll Free Main # [phone] Fax # [phone] Cell # [phone] [email or web address]

## 2022-05-01 NOTE — Patient Outreach (Signed)
Care Coordination   Follow Up Visit Note   05/01/2022  Name: Amanda Palmer MRN: [id number] DOB: 02-03-1934  Amanda Palmer is a 89 y.o. year old female who sees Amanda Palmer, Amanda Areola, MD for primary care. I spoke with Amanda Palmer by phone today.  What matters to the patients health and wellness today?   Receive Assistance Applying for Medicaid and [place].   Goals Addressed               This Visit's Progress     Receive Assistance Applying for Medicaid and [place]. (pt-stated)   On track     Care Coordination Interventions:  Interventions Today    Flowsheet Row Most Recent Value  Chronic Disease   Chronic disease during today's visit Other, Chronic Obstructive Pulmonary Disease (COPD), Hypertension (HTN), Atrial Fibrillation (AFib)  [Anxiety, Depression, Dizziness, Giddiness, Lack of Transportation & Requires Assistance with Activities of Daily Living.]  General Interventions   General Interventions Discussed/Reviewed General Interventions Discussed, General Interventions Reviewed, Annual Eye Exam, Labs, Durable Medical Equipment (DME), Vaccines, Health Screening, [employer], Doctor Visits, Communication with, Level of Care  [Primary Care Provider]  Labs Hgb A1c annually  Vaccines COVID-19, Flu, Pneumonia, RSV, Shingles  [Encouraged]  Doctor Visits Discussed/Reviewed Doctor Visits Discussed, Doctor Visits Reviewed, Annual Wellness Visits, PCP, Specialist  [Encouraged]  Health Screening Colonoscopy, Mammogram  [Encouraged]  Durable Medical Equipment (DME) Bed side commode, BP Cuff, Oxygen, Walker, Community education officer  PCP/Specialist Visits Compliance with follow-up visit  Communication with PCP/Specialists, RN  Level of Care Adult Daycare, Location manager, Assisted Living, Personal Care Services  Applications Medicaid, Personal Care Services  Exercise Interventions   Exercise Discussed/Reviewed Exercise Discussed, Exercise  Reviewed, Physical Activity, Assistive device use and maintanence  [Encouraged]  Physical Activity Discussed/Reviewed Physical Activity Discussed, Physical Activity Reviewed, Types of exercise, Home Exercise Program (HEP)  [Encouraged]  Education Interventions   Education Provided Provided Engineer, site, Provided Education  Provided Verbal Education On Nutrition, Blood Sugar Monitoring, Mental Health/Coping with Illness, Applications, Exercise, Medication, When to see the doctor, [employer], Engineer, materials, [place] Reviewed, Coping Strategies, Crisis, Anxiety, Depression, Grief and Loss, Suicide  Nutrition Interventions   Nutrition Discussed/Reviewed Nutrition Discussed, Nutrition Reviewed, Adding fruits and vegetables, Increaing proteins, Decreasing salt, Supplmental nutrition  [Encouraged]  Pharmacy Interventions   Pharmacy Dicussed/Reviewed Pharmacy Topics Discussed, Pharmacy Topics Reviewed, Medication Adherence, Affording Medications  Safety Interventions   Safety Discussed/Reviewed Safety Discussed, Safety Reviewed, Fall Risk, Home Safety  Home Safety Assistive Devices, Need for home safety assessment, Refer for community resources  Advanced Directive Interventions   Advanced Directives Discussed/Reviewed Advanced Directives Discussed, Advanced Directives Reviewed     Active Listening & Reflection Utilized.  Verbalization of Feelings Encouraged. Emotional Support Provided. Feelings of Frustration Regarding Loss of Independence Validated. Problem Solving Interventions Implemented. Task-Centered Solutions Activated.   Solution-Focused Strategies Employed. Caregiver Stress Acknowledged. Caregiver Resources Reviewed. Self-Enrollment in Caregiver Support Group of Interest Emphasized. Acceptance & Commitment Therapy  Indicated. Cognitive Behavioral Therapy Performed. Client-Centered Therapy Initiated. Confirmed Receipt of The Following List of Information & Applications & Encouraged Completion with Niece, [name]:  ~ 2023 Medicaid Tips  ~ Medicaid Application  ~ How to Apply for Medicaid On-Line  ~ [place] Instructions  ~ [place] Application  ~ [place] Provider List CSW Collaboration with Primary Care  Provider, Dr. Allyn Kenner (# [phone]), Via HIPAA Complaint Voicemail Message, to Request Completion of Personal Care Services Application. CSW Collaboration with Receptionist at [facility] Provider, Dr. Shepard General Office [phone]), to Confirm Receipt of Personal Care Services Application. CSW Collaboration with Receptionist at [facility] Provider, Dr. Shepard General Office [phone]), to Request Completed & [place] Application Be Faxed to [facility] [phone]), for Processing. Continue to [place] through [facility] [phone]).      SDOH assessments and interventions completed:  Yes.  Care Coordination Interventions:  Yes, provided.   Follow up plan: Follow up call scheduled for 05/14/2022 at 11:45 am.  Encounter Outcome:  Pt. Visit Completed.   Nat Christen, BSW, MSW, LCSW  Licensed Education officer, environmental [facility] System  Mailing [place] [address]. [address] [place], [place] [address] Physical Address-[address] [place], [place] [address] Toll Free Main # [phone] Fax # [phone] Cell # [phone] [email or web address]

## 2022-05-03 DIAGNOSIS — Z96653 Presence of artificial knee joint, bilateral: Secondary | ICD-10-CM | POA: Diagnosis not present

## 2022-05-03 DIAGNOSIS — Z7951 Long term (current) use of inhaled steroids: Secondary | ICD-10-CM | POA: Diagnosis not present

## 2022-05-03 DIAGNOSIS — I10 Essential (primary) hypertension: Secondary | ICD-10-CM | POA: Diagnosis not present

## 2022-05-03 DIAGNOSIS — Z9181 History of falling: Secondary | ICD-10-CM | POA: Diagnosis not present

## 2022-05-03 DIAGNOSIS — Z96641 Presence of right artificial hip joint: Secondary | ICD-10-CM | POA: Diagnosis not present

## 2022-05-03 DIAGNOSIS — I951 Orthostatic hypotension: Secondary | ICD-10-CM | POA: Diagnosis not present

## 2022-05-03 DIAGNOSIS — Z9981 Dependence on supplemental oxygen: Secondary | ICD-10-CM | POA: Diagnosis not present

## 2022-05-03 DIAGNOSIS — Z7901 Long term (current) use of anticoagulants: Secondary | ICD-10-CM | POA: Diagnosis not present

## 2022-05-03 DIAGNOSIS — J449 Chronic obstructive pulmonary disease, unspecified: Secondary | ICD-10-CM | POA: Diagnosis not present

## 2022-05-03 DIAGNOSIS — Z8673 Personal history of transient ischemic attack (TIA), and cerebral infarction without residual deficits: Secondary | ICD-10-CM | POA: Diagnosis not present

## 2022-05-03 DIAGNOSIS — R69 Illness, unspecified: Secondary | ICD-10-CM | POA: Diagnosis not present

## 2022-05-03 DIAGNOSIS — C50919 Malignant neoplasm of unspecified site of unspecified female breast: Secondary | ICD-10-CM | POA: Diagnosis not present

## 2022-05-03 DIAGNOSIS — I4819 Other persistent atrial fibrillation: Secondary | ICD-10-CM | POA: Diagnosis not present

## 2022-05-06 DIAGNOSIS — Z96653 Presence of artificial knee joint, bilateral: Secondary | ICD-10-CM | POA: Diagnosis not present

## 2022-05-06 DIAGNOSIS — R69 Illness, unspecified: Secondary | ICD-10-CM | POA: Diagnosis not present

## 2022-05-06 DIAGNOSIS — I951 Orthostatic hypotension: Secondary | ICD-10-CM | POA: Diagnosis not present

## 2022-05-06 DIAGNOSIS — J449 Chronic obstructive pulmonary disease, unspecified: Secondary | ICD-10-CM | POA: Diagnosis not present

## 2022-05-06 DIAGNOSIS — Z7951 Long term (current) use of inhaled steroids: Secondary | ICD-10-CM | POA: Diagnosis not present

## 2022-05-06 DIAGNOSIS — Z9981 Dependence on supplemental oxygen: Secondary | ICD-10-CM | POA: Diagnosis not present

## 2022-05-06 DIAGNOSIS — I10 Essential (primary) hypertension: Secondary | ICD-10-CM | POA: Diagnosis not present

## 2022-05-06 DIAGNOSIS — C50919 Malignant neoplasm of unspecified site of unspecified female breast: Secondary | ICD-10-CM | POA: Diagnosis not present

## 2022-05-06 DIAGNOSIS — Z8673 Personal history of transient ischemic attack (TIA), and cerebral infarction without residual deficits: Secondary | ICD-10-CM | POA: Diagnosis not present

## 2022-05-06 DIAGNOSIS — Z7901 Long term (current) use of anticoagulants: Secondary | ICD-10-CM | POA: Diagnosis not present

## 2022-05-06 DIAGNOSIS — I4819 Other persistent atrial fibrillation: Secondary | ICD-10-CM | POA: Diagnosis not present

## 2022-05-06 DIAGNOSIS — Z96641 Presence of right artificial hip joint: Secondary | ICD-10-CM | POA: Diagnosis not present

## 2022-05-06 DIAGNOSIS — Z9181 History of falling: Secondary | ICD-10-CM | POA: Diagnosis not present

## 2022-05-07 DIAGNOSIS — Z8673 Personal history of transient ischemic attack (TIA), and cerebral infarction without residual deficits: Secondary | ICD-10-CM | POA: Diagnosis not present

## 2022-05-07 DIAGNOSIS — Z7901 Long term (current) use of anticoagulants: Secondary | ICD-10-CM | POA: Diagnosis not present

## 2022-05-07 DIAGNOSIS — R69 Illness, unspecified: Secondary | ICD-10-CM | POA: Diagnosis not present

## 2022-05-07 DIAGNOSIS — Z9981 Dependence on supplemental oxygen: Secondary | ICD-10-CM | POA: Diagnosis not present

## 2022-05-07 DIAGNOSIS — Z96641 Presence of right artificial hip joint: Secondary | ICD-10-CM | POA: Diagnosis not present

## 2022-05-07 DIAGNOSIS — J449 Chronic obstructive pulmonary disease, unspecified: Secondary | ICD-10-CM | POA: Diagnosis not present

## 2022-05-07 DIAGNOSIS — Z96653 Presence of artificial knee joint, bilateral: Secondary | ICD-10-CM | POA: Diagnosis not present

## 2022-05-07 DIAGNOSIS — I4819 Other persistent atrial fibrillation: Secondary | ICD-10-CM | POA: Diagnosis not present

## 2022-05-07 DIAGNOSIS — Z7951 Long term (current) use of inhaled steroids: Secondary | ICD-10-CM | POA: Diagnosis not present

## 2022-05-07 DIAGNOSIS — I951 Orthostatic hypotension: Secondary | ICD-10-CM | POA: Diagnosis not present

## 2022-05-07 DIAGNOSIS — Z9181 History of falling: Secondary | ICD-10-CM | POA: Diagnosis not present

## 2022-05-07 DIAGNOSIS — I10 Essential (primary) hypertension: Secondary | ICD-10-CM | POA: Diagnosis not present

## 2022-05-07 DIAGNOSIS — C50919 Malignant neoplasm of unspecified site of unspecified female breast: Secondary | ICD-10-CM | POA: Diagnosis not present

## 2022-05-08 ENCOUNTER — Other Ambulatory Visit: Payer: Self-pay | Admitting: Cardiology

## 2022-05-08 ENCOUNTER — Ambulatory Visit: Payer: Medicare HMO | Admitting: Nurse Practitioner

## 2022-05-09 ENCOUNTER — Encounter: Payer: Self-pay | Admitting: Radiology

## 2022-05-09 DIAGNOSIS — J449 Chronic obstructive pulmonary disease, unspecified: Secondary | ICD-10-CM | POA: Diagnosis not present

## 2022-05-10 DIAGNOSIS — Z7951 Long term (current) use of inhaled steroids: Secondary | ICD-10-CM | POA: Diagnosis not present

## 2022-05-10 DIAGNOSIS — Z7901 Long term (current) use of anticoagulants: Secondary | ICD-10-CM | POA: Diagnosis not present

## 2022-05-10 DIAGNOSIS — I10 Essential (primary) hypertension: Secondary | ICD-10-CM | POA: Diagnosis not present

## 2022-05-10 DIAGNOSIS — Z9981 Dependence on supplemental oxygen: Secondary | ICD-10-CM | POA: Diagnosis not present

## 2022-05-10 DIAGNOSIS — I951 Orthostatic hypotension: Secondary | ICD-10-CM | POA: Diagnosis not present

## 2022-05-10 DIAGNOSIS — Z8673 Personal history of transient ischemic attack (TIA), and cerebral infarction without residual deficits: Secondary | ICD-10-CM | POA: Diagnosis not present

## 2022-05-10 DIAGNOSIS — J449 Chronic obstructive pulmonary disease, unspecified: Secondary | ICD-10-CM | POA: Diagnosis not present

## 2022-05-10 DIAGNOSIS — R69 Illness, unspecified: Secondary | ICD-10-CM | POA: Diagnosis not present

## 2022-05-10 DIAGNOSIS — I4819 Other persistent atrial fibrillation: Secondary | ICD-10-CM | POA: Diagnosis not present

## 2022-05-10 DIAGNOSIS — C50919 Malignant neoplasm of unspecified site of unspecified female breast: Secondary | ICD-10-CM | POA: Diagnosis not present

## 2022-05-10 DIAGNOSIS — Z96641 Presence of right artificial hip joint: Secondary | ICD-10-CM | POA: Diagnosis not present

## 2022-05-10 DIAGNOSIS — Z9181 History of falling: Secondary | ICD-10-CM | POA: Diagnosis not present

## 2022-05-10 DIAGNOSIS — Z96653 Presence of artificial knee joint, bilateral: Secondary | ICD-10-CM | POA: Diagnosis not present

## 2022-05-13 DIAGNOSIS — Z9981 Dependence on supplemental oxygen: Secondary | ICD-10-CM | POA: Diagnosis not present

## 2022-05-13 DIAGNOSIS — Z96641 Presence of right artificial hip joint: Secondary | ICD-10-CM | POA: Diagnosis not present

## 2022-05-13 DIAGNOSIS — I4819 Other persistent atrial fibrillation: Secondary | ICD-10-CM | POA: Diagnosis not present

## 2022-05-13 DIAGNOSIS — R69 Illness, unspecified: Secondary | ICD-10-CM | POA: Diagnosis not present

## 2022-05-13 DIAGNOSIS — I10 Essential (primary) hypertension: Secondary | ICD-10-CM | POA: Diagnosis not present

## 2022-05-13 DIAGNOSIS — Z96653 Presence of artificial knee joint, bilateral: Secondary | ICD-10-CM | POA: Diagnosis not present

## 2022-05-13 DIAGNOSIS — C50919 Malignant neoplasm of unspecified site of unspecified female breast: Secondary | ICD-10-CM | POA: Diagnosis not present

## 2022-05-13 DIAGNOSIS — Z9181 History of falling: Secondary | ICD-10-CM | POA: Diagnosis not present

## 2022-05-13 DIAGNOSIS — J449 Chronic obstructive pulmonary disease, unspecified: Secondary | ICD-10-CM | POA: Diagnosis not present

## 2022-05-13 DIAGNOSIS — Z7951 Long term (current) use of inhaled steroids: Secondary | ICD-10-CM | POA: Diagnosis not present

## 2022-05-13 DIAGNOSIS — Z7901 Long term (current) use of anticoagulants: Secondary | ICD-10-CM | POA: Diagnosis not present

## 2022-05-13 DIAGNOSIS — I951 Orthostatic hypotension: Secondary | ICD-10-CM | POA: Diagnosis not present

## 2022-05-13 DIAGNOSIS — Z8673 Personal history of transient ischemic attack (TIA), and cerebral infarction without residual deficits: Secondary | ICD-10-CM | POA: Diagnosis not present

## 2022-05-14 ENCOUNTER — Encounter: Payer: Self-pay | Admitting: *Deleted

## 2022-05-14 ENCOUNTER — Ambulatory Visit: Payer: Self-pay | Admitting: *Deleted

## 2022-05-14 DIAGNOSIS — I4819 Other persistent atrial fibrillation: Secondary | ICD-10-CM | POA: Diagnosis not present

## 2022-05-14 DIAGNOSIS — Z9181 History of falling: Secondary | ICD-10-CM | POA: Diagnosis not present

## 2022-05-14 DIAGNOSIS — Z9981 Dependence on supplemental oxygen: Secondary | ICD-10-CM | POA: Diagnosis not present

## 2022-05-14 DIAGNOSIS — I10 Essential (primary) hypertension: Secondary | ICD-10-CM | POA: Diagnosis not present

## 2022-05-14 DIAGNOSIS — Z7951 Long term (current) use of inhaled steroids: Secondary | ICD-10-CM | POA: Diagnosis not present

## 2022-05-14 DIAGNOSIS — C50919 Malignant neoplasm of unspecified site of unspecified female breast: Secondary | ICD-10-CM | POA: Diagnosis not present

## 2022-05-14 DIAGNOSIS — R69 Illness, unspecified: Secondary | ICD-10-CM | POA: Diagnosis not present

## 2022-05-14 DIAGNOSIS — J449 Chronic obstructive pulmonary disease, unspecified: Secondary | ICD-10-CM | POA: Diagnosis not present

## 2022-05-14 DIAGNOSIS — I951 Orthostatic hypotension: Secondary | ICD-10-CM | POA: Diagnosis not present

## 2022-05-14 DIAGNOSIS — Z8673 Personal history of transient ischemic attack (TIA), and cerebral infarction without residual deficits: Secondary | ICD-10-CM | POA: Diagnosis not present

## 2022-05-14 DIAGNOSIS — Z96653 Presence of artificial knee joint, bilateral: Secondary | ICD-10-CM | POA: Diagnosis not present

## 2022-05-14 DIAGNOSIS — Z96641 Presence of right artificial hip joint: Secondary | ICD-10-CM | POA: Diagnosis not present

## 2022-05-14 DIAGNOSIS — Z7901 Long term (current) use of anticoagulants: Secondary | ICD-10-CM | POA: Diagnosis not present

## 2022-05-14 NOTE — Patient Outreach (Signed)
Care Coordination   Follow Up Visit Note   05/14/2022  Name: SUMMERLYNN MOREHART MRN: [id number] DOB: Jul 25, 1933  ARDETH SIELING is a 89 y.o. year old female who sees [name], [name], MD for primary care. I spoke with [name] and niece, [name] by phone today.  What matters to the patients health and wellness today?  Receive Assistance Applying for Medicaid and [place].   Goals Addressed               This Visit's Progress     Receive Assistance Applying for Medicaid and [place]. (pt-stated)   On track     Care Coordination Interventions:  Interventions Today    Flowsheet Row Most Recent Value  Chronic Disease   Chronic disease during today's visit Other, Chronic Obstructive Pulmonary Disease (COPD), Hypertension (HTN), Atrial Fibrillation (AFib)  [Anxiety, Depression, Dizziness, Giddiness, Lack of Transportation & Requires Assistance with Activities of Daily Living.]  General Interventions   General Interventions Discussed/Reviewed General Interventions Discussed, General Interventions Reviewed, Annual Eye Exam, Labs, Durable Medical Equipment (DME), Vaccines, Health Screening, [employer], Doctor Visits, Communication with, Level of Care  [Primary Care Provider]  Labs Hgb A1c annually  Vaccines COVID-19, Flu, Pneumonia, RSV, Shingles  [Encouraged]  Doctor Visits Discussed/Reviewed Doctor Visits Discussed, Doctor Visits Reviewed, Annual Wellness Visits, PCP, Specialist  [Encouraged]  Health Screening Colonoscopy, Mammogram  [Encouraged]  Durable Medical Equipment (DME) Bed side commode, BP Cuff, Oxygen, Walker, Community education officer  PCP/Specialist Visits Compliance with follow-up visit  Communication with PCP/Specialists, RN  Level of Care Adult Daycare, Location manager, Assisted Living, Personal Care Services  Applications Medicaid, Personal Care Services  Exercise Interventions   Exercise Discussed/Reviewed Exercise  Discussed, Exercise Reviewed, Physical Activity, Assistive device use and maintanence  [Encouraged]  Physical Activity Discussed/Reviewed Physical Activity Discussed, Physical Activity Reviewed, Types of exercise, Home Exercise Program (HEP)  [Encouraged]  Education Interventions   Education Provided Provided Engineer, site, Provided Education  Provided Verbal Education On Nutrition, Blood Sugar Monitoring, Mental Health/Coping with Illness, Applications, Exercise, Medication, When to see the doctor, [employer], Engineer, materials, [place] Reviewed, Coping Strategies, Crisis, Anxiety, Depression, Grief and Loss, Suicide  Nutrition Interventions   Nutrition Discussed/Reviewed Nutrition Discussed, Nutrition Reviewed, Adding fruits and vegetables, Increaing proteins, Decreasing salt, Supplmental nutrition  [Encouraged]  Pharmacy Interventions   Pharmacy Dicussed/Reviewed Pharmacy Topics Discussed, Pharmacy Topics Reviewed, Medication Adherence, Affording Medications  Safety Interventions   Safety Discussed/Reviewed Safety Discussed, Safety Reviewed, Fall Risk, Home Safety  Home Safety Assistive Devices, Need for home safety assessment, Refer for community resources  Advanced Directive Interventions   Advanced Directives Discussed/Reviewed Advanced Directives Discussed, Advanced Directives Reviewed     Active Listening & Reflection Utilized.  Verbalization of Feelings Encouraged. Emotional Support Provided. Caregiver Stress Acknowledged. Caregiver Burnout Validated. Caregiver Resources Reviewed. Self-Enrollment in Caregiver Support Group of Interest Emphasized. Problem Solving Interventions Implemented. Task-Centered Solutions Employed.   Solution-Focused Strategies Activated. Acceptance & Commitment Therapy Indicated. Cognitive Behavioral  Therapy Performed. Client-Centered Therapy Initiated. CSW Collaboration with Niece, [name] to [employer] with Completion of Application for Medicaid & Submission to [facility] [place] [phone]), for Processing.  CSW Collaboration with Niece, [name] to [employer] with CSW [phone]), if She Has Questions, Needs Assistance with Medicaid Application Completion & Submission, or If Additional Social Work  Needs Are Identified Between Now & Our Next Scheduled Telephone Outreach Call. CSW Collaboration with Receptionist at [facility] Provider, Dr. Shepard General Office [phone]), to Inquire About Status of [place] Application Request.            ~ Unable to [place] Application. ~ Re-faxed Personal Care Services Application to Primary Care Provider, Dr. Allyn Kenner (# [phone]), on 05/14/2022. Confirmed No Recent Falls or Recurrence of Syncope & Encouraged Use of Assistive Devices for Mobility & Safety.       SDOH assessments and interventions completed:  Yes.  Care Coordination Interventions:  Yes, provided.   Follow up plan: Follow up call scheduled for 05/30/2022 at 9:30 am.  Encounter Outcome:  Pt. Visit Completed.   Nat Christen, BSW, MSW, LCSW  Licensed Education officer, environmental Health System  Mailing [place] [address] [place], [place] [address] Physical Address[address] [place], [place] [address] Toll Free Main # [phone] Fax # [phone] Cell # [phone] [email or web address]

## 2022-05-14 NOTE — Patient Instructions (Signed)
Visit Information  Thank you for taking time to visit with me today. Please don't hesitate to contact me if I can be of assistance to you.   Following are the goals we discussed today:   Goals Addressed               This Visit's Progress     Receive Assistance Applying for Medicaid and [place]. (pt-stated)   On track     Care Coordination Interventions:  Interventions Today    Flowsheet Row Most Recent Value  Chronic Disease   Chronic disease during today's visit Other, Chronic Obstructive Pulmonary Disease (COPD), Hypertension (HTN), Atrial Fibrillation (AFib)  [Anxiety, Depression, Dizziness, Giddiness, Lack of Transportation & Requires Assistance with Activities of Daily Living.]  General Interventions   General Interventions Discussed/Reviewed General Interventions Discussed, General Interventions Reviewed, Annual Eye Exam, Labs, Durable Medical Equipment (DME), Vaccines, Health Screening, [employer], Doctor Visits, Communication with, Level of Care  [Primary Care Provider]  Labs Hgb A1c annually  Vaccines COVID-19, Flu, Pneumonia, RSV, Shingles  [Encouraged]  Doctor Visits Discussed/Reviewed Doctor Visits Discussed, Doctor Visits Reviewed, Annual Wellness Visits, PCP, Specialist  [Encouraged]  Health Screening Colonoscopy, Mammogram  [Encouraged]  Durable Medical Equipment (DME) Bed side commode, BP Cuff, Oxygen, Walker, Community education officer  PCP/Specialist Visits Compliance with follow-up visit  Communication with PCP/Specialists, RN  Level of Care Adult Daycare, Location manager, Assisted Living, Personal Care Services  Applications Medicaid, Personal Care Services  Exercise Interventions   Exercise Discussed/Reviewed Exercise Discussed, Exercise Reviewed, Physical Activity, Assistive device use and maintanence  [Encouraged]  Physical Activity Discussed/Reviewed Physical Activity Discussed, Physical Activity Reviewed, Types of exercise, Home  Exercise Program (HEP)  [Encouraged]  Education Interventions   Education Provided Provided Engineer, site, Provided Education  Provided Verbal Education On Nutrition, Blood Sugar Monitoring, Mental Health/Coping with Illness, Applications, Exercise, Medication, When to see the doctor, [employer], Engineer, materials, [place] Reviewed, Coping Strategies, Crisis, Anxiety, Depression, Grief and Loss, Suicide  Nutrition Interventions   Nutrition Discussed/Reviewed Nutrition Discussed, Nutrition Reviewed, Adding fruits and vegetables, Increaing proteins, Decreasing salt, Supplmental nutrition  [Encouraged]  Pharmacy Interventions   Pharmacy Dicussed/Reviewed Pharmacy Topics Discussed, Pharmacy Topics Reviewed, Medication Adherence, Affording Medications  Safety Interventions   Safety Discussed/Reviewed Safety Discussed, Safety Reviewed, Fall Risk, Home Safety  Home Safety Assistive Devices, Need for home safety assessment, Refer for community resources  Advanced Directive Interventions   Advanced Directives Discussed/Reviewed Advanced Directives Discussed, Advanced Directives Reviewed     Active Listening & Reflection Utilized.  Verbalization of Feelings Encouraged. Emotional Support Provided. Caregiver Stress Acknowledged. Caregiver Burnout Validated. Caregiver Resources Reviewed. Self-Enrollment in Caregiver Support Group of Interest Emphasized. Problem Solving Interventions Implemented. Task-Centered Solutions Employed.   Solution-Focused Strategies Activated. Acceptance & Commitment Therapy Indicated. Cognitive Behavioral Therapy Performed. Client-Centered Therapy Initiated. CSW Collaboration with Niece, [name] to [employer] with Completion of Application for Medicaid & Submission to [employer] [place] [phone]), for Processing.  CSW Collaboration with Niece, [name] to [employer] with CSW [phone]), if She Has Questions, Needs Assistance with Medicaid Application Completion & Submission, or If Additional Social Work Needs Are Identified Between Now & Our Next Scheduled Telephone [employer]. CSW Collaboration with Receptionist at [facility] [address] Provider, Dr. Shepard General Office [phone]), to Inquire About Status of Personal Care Services Application Request.            ~  Unable to [place] Application. ~ Re-faxed Personal Care Services Application to Primary Care Provider, Dr. Allyn Kenner (# [phone]), on 05/14/2022. Confirmed No Recent Falls or Recurrence of Syncope & Encouraged Use of Assistive Devices for Mobility & Safety.       Our next appointment is by telephone on 05/30/2022 at 9:30 am.  Please call the care guide team at [phone] if you need to cancel or reschedule your appointment.   If you are experiencing a Mental Health or [place] or need someone to talk to, please call the Suicide and Crisis Lifeline: 988 call the [place] National Suicide Prevention Lifeline: 859-174-5041 or TTY: 225-162-4170 TTY 416-513-1483) to talk to a trained counselor call 1-800-273-TALK (toll free, 24 hour hotline) go to [facility] [address] [place] [phone]) call the [place]: [phone] call 911  Patient verbalizes understanding of instructions and care plan provided today and agrees to view in [place]. Active MyChart status and patient understanding of how to access instructions and care plan via MyChart confirmed with patient.     Telephone follow up appointment with care management team member scheduled for:  05/30/2022 at 9:30 am.  Nat Christen, BSW, MSW, [place]  Licensed Clinical Social Worker  [place]  Mailing [place]. [address] [place], [place] [address] Physical Address-[address] [place], [place] [address] Toll Free Main # [phone] Fax # [phone] Cell # [phone] [email or web address]

## 2022-05-15 DIAGNOSIS — Z7951 Long term (current) use of inhaled steroids: Secondary | ICD-10-CM | POA: Diagnosis not present

## 2022-05-15 DIAGNOSIS — Z8673 Personal history of transient ischemic attack (TIA), and cerebral infarction without residual deficits: Secondary | ICD-10-CM | POA: Diagnosis not present

## 2022-05-15 DIAGNOSIS — I4819 Other persistent atrial fibrillation: Secondary | ICD-10-CM | POA: Diagnosis not present

## 2022-05-15 DIAGNOSIS — I10 Essential (primary) hypertension: Secondary | ICD-10-CM | POA: Diagnosis not present

## 2022-05-15 DIAGNOSIS — Z96641 Presence of right artificial hip joint: Secondary | ICD-10-CM | POA: Diagnosis not present

## 2022-05-15 DIAGNOSIS — C50919 Malignant neoplasm of unspecified site of unspecified female breast: Secondary | ICD-10-CM | POA: Diagnosis not present

## 2022-05-15 DIAGNOSIS — Z7901 Long term (current) use of anticoagulants: Secondary | ICD-10-CM | POA: Diagnosis not present

## 2022-05-15 DIAGNOSIS — I951 Orthostatic hypotension: Secondary | ICD-10-CM | POA: Diagnosis not present

## 2022-05-15 DIAGNOSIS — Z96653 Presence of artificial knee joint, bilateral: Secondary | ICD-10-CM | POA: Diagnosis not present

## 2022-05-15 DIAGNOSIS — R69 Illness, unspecified: Secondary | ICD-10-CM | POA: Diagnosis not present

## 2022-05-15 DIAGNOSIS — Z9181 History of falling: Secondary | ICD-10-CM | POA: Diagnosis not present

## 2022-05-15 DIAGNOSIS — J449 Chronic obstructive pulmonary disease, unspecified: Secondary | ICD-10-CM | POA: Diagnosis not present

## 2022-05-15 DIAGNOSIS — Z9981 Dependence on supplemental oxygen: Secondary | ICD-10-CM | POA: Diagnosis not present

## 2022-05-16 DIAGNOSIS — Z961 Presence of intraocular lens: Secondary | ICD-10-CM | POA: Diagnosis not present

## 2022-05-16 DIAGNOSIS — H524 Presbyopia: Secondary | ICD-10-CM | POA: Diagnosis not present

## 2022-05-16 DIAGNOSIS — H5213 Myopia, bilateral: Secondary | ICD-10-CM | POA: Diagnosis not present

## 2022-05-16 DIAGNOSIS — H40013 Open angle with borderline findings, low risk, bilateral: Secondary | ICD-10-CM | POA: Diagnosis not present

## 2022-05-17 ENCOUNTER — Ambulatory Visit: Payer: Medicare HMO | Admitting: Nurse Practitioner

## 2022-05-17 DIAGNOSIS — I951 Orthostatic hypotension: Secondary | ICD-10-CM | POA: Diagnosis not present

## 2022-05-17 DIAGNOSIS — C50919 Malignant neoplasm of unspecified site of unspecified female breast: Secondary | ICD-10-CM | POA: Diagnosis not present

## 2022-05-17 DIAGNOSIS — I10 Essential (primary) hypertension: Secondary | ICD-10-CM | POA: Diagnosis not present

## 2022-05-17 DIAGNOSIS — I4819 Other persistent atrial fibrillation: Secondary | ICD-10-CM | POA: Diagnosis not present

## 2022-05-17 DIAGNOSIS — R69 Illness, unspecified: Secondary | ICD-10-CM | POA: Diagnosis not present

## 2022-05-17 DIAGNOSIS — Z7901 Long term (current) use of anticoagulants: Secondary | ICD-10-CM | POA: Diagnosis not present

## 2022-05-17 DIAGNOSIS — Z7951 Long term (current) use of inhaled steroids: Secondary | ICD-10-CM | POA: Diagnosis not present

## 2022-05-17 DIAGNOSIS — Z9981 Dependence on supplemental oxygen: Secondary | ICD-10-CM | POA: Diagnosis not present

## 2022-05-17 DIAGNOSIS — Z96653 Presence of artificial knee joint, bilateral: Secondary | ICD-10-CM | POA: Diagnosis not present

## 2022-05-17 DIAGNOSIS — Z8673 Personal history of transient ischemic attack (TIA), and cerebral infarction without residual deficits: Secondary | ICD-10-CM | POA: Diagnosis not present

## 2022-05-17 DIAGNOSIS — Z9181 History of falling: Secondary | ICD-10-CM | POA: Diagnosis not present

## 2022-05-17 DIAGNOSIS — Z96641 Presence of right artificial hip joint: Secondary | ICD-10-CM | POA: Diagnosis not present

## 2022-05-17 DIAGNOSIS — J449 Chronic obstructive pulmonary disease, unspecified: Secondary | ICD-10-CM | POA: Diagnosis not present

## 2022-05-18 DIAGNOSIS — I5032 Chronic diastolic (congestive) heart failure: Secondary | ICD-10-CM | POA: Diagnosis not present

## 2022-05-18 DIAGNOSIS — I480 Paroxysmal atrial fibrillation: Secondary | ICD-10-CM

## 2022-05-18 DIAGNOSIS — D6869 Other thrombophilia: Secondary | ICD-10-CM | POA: Diagnosis not present

## 2022-05-20 DIAGNOSIS — J449 Chronic obstructive pulmonary disease, unspecified: Secondary | ICD-10-CM | POA: Diagnosis not present

## 2022-05-21 ENCOUNTER — Ambulatory Visit: Payer: Self-pay | Admitting: *Deleted
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
## 2022-05-21 NOTE — Patient Instructions (Addendum)
Visit Information  Thank you for taking time to visit with me today. Please don't hesitate to contact me if I can be of assistance to you.   Following are the goals we discussed today:   Goals Addressed               This Visit's Progress     Patient Stated     managing and coordination of home health aide services [facility]) (pt-stated)   On track     Care Coordination Interventions: Discussed plans with patient for ongoing care management follow up and provided patient with direct contact information for care management team To have occupational therapy restart services Pending personal care services - active with [place] [facility]) (pt-stated)   On track     Care Coordination Interventions: Evaluation of current treatment plan related to her [place] transportation resources  and patient's adherence to plan as established by provider Collaborated with [employer] customer service staff [place] for living program staff regarding patient loctal transportation resources Provided patient with Resources for Living program  educational materials related to [place] member resources  Reviewed scheduled/upcoming provider appointments including with pcp and specialists Discussed plans with patient for ongoing care management follow up and provided patient with direct contact information for care management team Screening for signs and symptoms of depression related to chronic disease state  Assessed social determinant of health barriers Confirmed transportation assistance from her niece and nephew as  & no [employer] Medicare transportation benefit on her plan [employer] transportation Resources for living at [phone] 05/21/22 no issues reported for transportation today         Our next appointment is by telephone on 06/21/22 at 2:15 pm  Please call the care guide team at [phone] if you need to cancel or reschedule your appointment.   If you are  experiencing a Mental Health or [place] or need someone to talk to, please call the Suicide and Crisis Lifeline: 988 call the [place] National Suicide Prevention Lifeline: 2136798654 or TTY: 332-829-6375 TTY 325-716-9311) to talk to a trained counselor call 1-800-273-TALK (toll free, 24 hour hotline) call the [facility]: [phone] call 911   Patient verbalizes understanding of instructions and care plan provided today and agrees to view in [place]. Active MyChart status and patient understanding of how to access instructions and care plan via MyChart confirmed with patient.     The patient has been provided with contact information for the care management team and has been advised to call with any health related questions or concerns.   Tira Lafferty L. Lavina Hamman, RN, BSN, [place] Coordinator Office number [phone]

## 2022-05-22 DIAGNOSIS — I10 Essential (primary) hypertension: Secondary | ICD-10-CM | POA: Diagnosis not present

## 2022-05-22 DIAGNOSIS — Z96653 Presence of artificial knee joint, bilateral: Secondary | ICD-10-CM | POA: Diagnosis not present

## 2022-05-22 DIAGNOSIS — C50919 Malignant neoplasm of unspecified site of unspecified female breast: Secondary | ICD-10-CM | POA: Diagnosis not present

## 2022-05-22 DIAGNOSIS — Z96641 Presence of right artificial hip joint: Secondary | ICD-10-CM | POA: Diagnosis not present

## 2022-05-22 DIAGNOSIS — J449 Chronic obstructive pulmonary disease, unspecified: Secondary | ICD-10-CM | POA: Diagnosis not present

## 2022-05-22 DIAGNOSIS — Z7901 Long term (current) use of anticoagulants: Secondary | ICD-10-CM | POA: Diagnosis not present

## 2022-05-22 DIAGNOSIS — Z9181 History of falling: Secondary | ICD-10-CM | POA: Diagnosis not present

## 2022-05-22 DIAGNOSIS — I4819 Other persistent atrial fibrillation: Secondary | ICD-10-CM | POA: Diagnosis not present

## 2022-05-22 DIAGNOSIS — Z8673 Personal history of transient ischemic attack (TIA), and cerebral infarction without residual deficits: Secondary | ICD-10-CM | POA: Diagnosis not present

## 2022-05-22 DIAGNOSIS — Z9981 Dependence on supplemental oxygen: Secondary | ICD-10-CM | POA: Diagnosis not present

## 2022-05-22 DIAGNOSIS — I951 Orthostatic hypotension: Secondary | ICD-10-CM | POA: Diagnosis not present

## 2022-05-22 DIAGNOSIS — R69 Illness, unspecified: Secondary | ICD-10-CM | POA: Diagnosis not present

## 2022-05-22 DIAGNOSIS — Z7951 Long term (current) use of inhaled steroids: Secondary | ICD-10-CM | POA: Diagnosis not present

## 2022-05-24 DIAGNOSIS — I951 Orthostatic hypotension: Secondary | ICD-10-CM | POA: Diagnosis not present

## 2022-05-24 DIAGNOSIS — J449 Chronic obstructive pulmonary disease, unspecified: Secondary | ICD-10-CM | POA: Diagnosis not present

## 2022-05-24 DIAGNOSIS — I10 Essential (primary) hypertension: Secondary | ICD-10-CM | POA: Diagnosis not present

## 2022-05-24 DIAGNOSIS — Z7901 Long term (current) use of anticoagulants: Secondary | ICD-10-CM | POA: Diagnosis not present

## 2022-05-24 DIAGNOSIS — Z9981 Dependence on supplemental oxygen: Secondary | ICD-10-CM | POA: Diagnosis not present

## 2022-05-24 DIAGNOSIS — R69 Illness, unspecified: Secondary | ICD-10-CM | POA: Diagnosis not present

## 2022-05-24 DIAGNOSIS — Z8673 Personal history of transient ischemic attack (TIA), and cerebral infarction without residual deficits: Secondary | ICD-10-CM | POA: Diagnosis not present

## 2022-05-24 DIAGNOSIS — C50919 Malignant neoplasm of unspecified site of unspecified female breast: Secondary | ICD-10-CM | POA: Diagnosis not present

## 2022-05-24 DIAGNOSIS — Z7951 Long term (current) use of inhaled steroids: Secondary | ICD-10-CM | POA: Diagnosis not present

## 2022-05-24 DIAGNOSIS — I4819 Other persistent atrial fibrillation: Secondary | ICD-10-CM | POA: Diagnosis not present

## 2022-05-24 DIAGNOSIS — Z96653 Presence of artificial knee joint, bilateral: Secondary | ICD-10-CM | POA: Diagnosis not present

## 2022-05-24 DIAGNOSIS — Z9181 History of falling: Secondary | ICD-10-CM | POA: Diagnosis not present

## 2022-05-24 DIAGNOSIS — Z96641 Presence of right artificial hip joint: Secondary | ICD-10-CM | POA: Diagnosis not present

## 2022-05-29 DIAGNOSIS — I10 Essential (primary) hypertension: Secondary | ICD-10-CM | POA: Diagnosis not present

## 2022-05-29 DIAGNOSIS — L110 Acquired keratosis follicularis: Secondary | ICD-10-CM | POA: Diagnosis not present

## 2022-05-29 DIAGNOSIS — R69 Illness, unspecified: Secondary | ICD-10-CM | POA: Diagnosis not present

## 2022-05-29 DIAGNOSIS — Z9181 History of falling: Secondary | ICD-10-CM | POA: Diagnosis not present

## 2022-05-29 DIAGNOSIS — Z9981 Dependence on supplemental oxygen: Secondary | ICD-10-CM | POA: Diagnosis not present

## 2022-05-29 DIAGNOSIS — Z7901 Long term (current) use of anticoagulants: Secondary | ICD-10-CM | POA: Diagnosis not present

## 2022-05-29 DIAGNOSIS — Z96653 Presence of artificial knee joint, bilateral: Secondary | ICD-10-CM | POA: Diagnosis not present

## 2022-05-29 DIAGNOSIS — Z7951 Long term (current) use of inhaled steroids: Secondary | ICD-10-CM | POA: Diagnosis not present

## 2022-05-29 DIAGNOSIS — M79671 Pain in right foot: Secondary | ICD-10-CM | POA: Diagnosis not present

## 2022-05-29 DIAGNOSIS — C50919 Malignant neoplasm of unspecified site of unspecified female breast: Secondary | ICD-10-CM | POA: Diagnosis not present

## 2022-05-29 DIAGNOSIS — M79672 Pain in left foot: Secondary | ICD-10-CM | POA: Diagnosis not present

## 2022-05-29 DIAGNOSIS — Z8673 Personal history of transient ischemic attack (TIA), and cerebral infarction without residual deficits: Secondary | ICD-10-CM | POA: Diagnosis not present

## 2022-05-29 DIAGNOSIS — J449 Chronic obstructive pulmonary disease, unspecified: Secondary | ICD-10-CM | POA: Diagnosis not present

## 2022-05-29 DIAGNOSIS — I951 Orthostatic hypotension: Secondary | ICD-10-CM | POA: Diagnosis not present

## 2022-05-29 DIAGNOSIS — I4819 Other persistent atrial fibrillation: Secondary | ICD-10-CM | POA: Diagnosis not present

## 2022-05-29 DIAGNOSIS — I739 Peripheral vascular disease, unspecified: Secondary | ICD-10-CM | POA: Diagnosis not present

## 2022-05-29 DIAGNOSIS — Z96641 Presence of right artificial hip joint: Secondary | ICD-10-CM | POA: Diagnosis not present

## 2022-05-30 ENCOUNTER — Ambulatory Visit: Payer: Self-pay | Admitting: *Deleted

## 2022-05-30 ENCOUNTER — Encounter: Payer: Self-pay | Admitting: *Deleted

## 2022-05-30 NOTE — Patient Instructions (Signed)
Visit Information  Thank you for taking time to visit with me today. Please don't hesitate to contact me if I can be of assistance to you.   Following are the goals we discussed today:   Goals Addressed               This Visit's Progress     COMPLETED: Receive Assistance Applying for Medicaid and [place]. (pt-stated)   On track     Care Coordination Interventions:  Interventions Today    Flowsheet Row Most Recent Value  Chronic Disease   Chronic disease during today's visit Other, Chronic Obstructive Pulmonary Disease (COPD), Hypertension (HTN), Atrial Fibrillation (AFib)  [Anxiety, Depression, Dizziness, Giddiness, Lack of Transportation & Requires Assistance with Activities of Daily Living.]  General Interventions   General Interventions Discussed/Reviewed General Interventions Discussed, General Interventions Reviewed, Annual Eye Exam, Labs, Durable Medical Equipment (DME), Vaccines, Health Screening, [employer], Doctor Visits, Communication with, Level of Care  [Primary Care Provider]  Labs Hgb A1c annually  Vaccines COVID-19, Flu, Pneumonia, RSV, Shingles  [Encouraged]  Doctor Visits Discussed/Reviewed Doctor Visits Discussed, Doctor Visits Reviewed, Annual Wellness Visits, PCP, Specialist  [Encouraged]  Health Screening Colonoscopy, Mammogram  [Encouraged]  Durable Medical Equipment (DME) Bed side commode, BP Cuff, Oxygen, Walker, Community education officer  PCP/Specialist Visits Compliance with follow-up visit  Communication with PCP/Specialists, RN  Level of Care Adult Daycare, Location manager, Assisted Living, Personal Care Services  Applications Medicaid, Personal Care Services  Exercise Interventions   Exercise Discussed/Reviewed Exercise Discussed, Exercise Reviewed, Physical Activity, Assistive device use and maintanence  [Encouraged]  Physical Activity Discussed/Reviewed Physical Activity Discussed, Physical Activity Reviewed, Types of  exercise, Home Exercise Program (HEP)  [Encouraged]  Education Interventions   Education Provided Provided Engineer, site, Provided Education  Provided Verbal Education On Nutrition, Blood Sugar Monitoring, Mental Health/Coping with Illness, Applications, Exercise, Medication, When to see the doctor, [employer], Engineer, materials, [place] Reviewed, Coping Strategies, Crisis, Anxiety, Depression, Grief and Loss, Suicide  Nutrition Interventions   Nutrition Discussed/Reviewed Nutrition Discussed, Nutrition Reviewed, Adding fruits and vegetables, Increaing proteins, Decreasing salt, Supplmental nutrition  [Encouraged]  Pharmacy Interventions   Pharmacy Dicussed/Reviewed Pharmacy Topics Discussed, Pharmacy Topics Reviewed, Medication Adherence, Affording Medications  Safety Interventions   Safety Discussed/Reviewed Safety Discussed, Safety Reviewed, Fall Risk, Home Safety  Home Safety Assistive Devices, Need for home safety assessment, Refer for community resources  Advanced Directive Interventions   Advanced Directives Discussed/Reviewed Advanced Directives Discussed, Advanced Directives Reviewed     Active Listening & Reflection Utilized.  Verbalization of Feelings Encouraged. Emotional Support Provided. Feelings of Sadness Regarding Loss of Independence Acknowledged. Caregiver Stress/Burnout Validated. Caregiver Resources Reviewed. Self-Enrollment in Caregiver Support Group of Interest Emphasized. Problem Solving Interventions Implemented. Task-Centered Solutions Employed.   Solution-Focused Strategies Activated. Acceptance & Commitment Therapy Indicated. Cognitive Behavioral Therapy Performed. Client-Centered Therapy Initiated. CSW Collaboration with Niece, [name] to [employer] for [employer], through [employer] [place] [phone]), Due to [place] Exceeding Poverty Guidelines for [employer] [place] [employer] [place] for [employer] 2024.  CSW Collaboration with Niece, [name] to Explain Inability to Apply for [place], through [facility] [phone]), Due to Ineligibility for Medicaid, through [employer] [place] [phone]). CSW Collaboration with Niece, [name] to Learn of Her Decision to Hire a Surveyor, mining,  5 Hours a Day, 2 Days a Week, to Assist with Bathing, Meal Preparation, Light Housekeeping Duties, Paramedic, etc. Please Remember to [place], if You Experience a Fall or Have a Medical Emergency. Please Continue to [employer] through Niece, [name] & Nephew, [place]. Please Contact CSW Directly (# [phone]), if You Have Questions, Need Assistance, or If Additional Social Work Needs Are Identified In The Near Future.      Please call the care guide team at [phone] if you need to cancel or reschedule your appointment.   If you are experiencing a Mental Health or [place] or need someone to talk to, please call the Suicide and Crisis Lifeline: 988 call the [place] National Suicide Prevention Lifeline: (630)728-0773 or TTY: 848-273-7073 TTY (954)346-0182) to talk to a trained counselor call 1-800-273-TALK (toll free, 24 hour hotline) go to [facility] [address], [place] [phone]) call the [place]: 4253127411 call 911  Patient verbalizes understanding of instructions and care plan provided today and agrees to view in [place]. Active MyChart status and patient understanding of how to access instructions and care plan via MyChart confirmed with patient.     No further follow up required.  Nat Christen, BSW, MSW,  LCSW  Licensed Education officer, environmental Health System  Mailing [place] [address], [place], [place] [address] Physical Address-[address], [place], [place] [address] Toll Free Main # [phone] Fax # [phone] Cell # [phone] [email or web address]

## 2022-05-30 NOTE — Patient Outreach (Signed)
Care Coordination   Follow Up Visit Note   05/30/2022  Name: Amanda Palmer MRN: [id number] DOB: Jan 16, 1934  Amanda Palmer is a 89 y.o. year old female who sees [name], [name], MD for primary care. I spoke with [name] and niece, [name] by phone today.  What matters to the patients health and wellness today?  Receive Assistance Applying for Medicaid and [place].   Goals Addressed               This Visit's Progress     COMPLETED: Receive Assistance Applying for Medicaid and [place]. (pt-stated)   On track     Care Coordination Interventions:  Interventions Today    Flowsheet Row Most Recent Value  Chronic Disease   Chronic disease during today's visit Other, Chronic Obstructive Pulmonary Disease (COPD), Hypertension (HTN), Atrial Fibrillation (AFib)  [Anxiety, Depression, Dizziness, Giddiness, Lack of Transportation & Requires Assistance with Activities of Daily Living.]  General Interventions   General Interventions Discussed/Reviewed General Interventions Discussed, General Interventions Reviewed, Annual Eye Exam, Labs, Durable Medical Equipment (DME), Vaccines, Health Screening, [employer], Doctor Visits, Communication with, Level of Care  [Primary Care Provider]  Labs Hgb A1c annually  Vaccines COVID-19, Flu, Pneumonia, RSV, Shingles  [Encouraged]  Doctor Visits Discussed/Reviewed Doctor Visits Discussed, Doctor Visits Reviewed, Annual Wellness Visits, PCP, Specialist  [Encouraged]  Health Screening Colonoscopy, Mammogram  [Encouraged]  Durable Medical Equipment (DME) Bed side commode, BP Cuff, Oxygen, Walker, Community education officer  PCP/Specialist Visits Compliance with follow-up visit  Communication with PCP/Specialists, RN  Level of Care Adult Daycare, Location manager, Assisted Living, Personal Care Services  Applications Medicaid, Personal Care Services  Exercise Interventions   Exercise Discussed/Reviewed  Exercise Discussed, Exercise Reviewed, Physical Activity, Assistive device use and maintanence  [Encouraged]  Physical Activity Discussed/Reviewed Physical Activity Discussed, Physical Activity Reviewed, Types of exercise, Home Exercise Program (HEP)  [Encouraged]  Education Interventions   Education Provided Provided Engineer, site, Provided Education  Provided Verbal Education On Nutrition, Blood Sugar Monitoring, Mental Health/Coping with Illness, Applications, Exercise, Medication, When to see the doctor, [employer], Engineer, materials, [place] Reviewed, Coping Strategies, Crisis, Anxiety, Depression, Grief and Loss, Suicide  Nutrition Interventions   Nutrition Discussed/Reviewed Nutrition Discussed, Nutrition Reviewed, Adding fruits and vegetables, Increaing proteins, Decreasing salt, Supplmental nutrition  [Encouraged]  Pharmacy Interventions   Pharmacy Dicussed/Reviewed Pharmacy Topics Discussed, Pharmacy Topics Reviewed, Medication Adherence, Affording Medications  Safety Interventions   Safety Discussed/Reviewed Safety Discussed, Safety Reviewed, Fall Risk, Home Safety  Home Safety Assistive Devices, Need for home safety assessment, Refer for community resources  Advanced Directive Interventions   Advanced Directives Discussed/Reviewed Advanced Directives Discussed, Advanced Directives Reviewed     Active Listening & Reflection Utilized.  Verbalization of Feelings Encouraged. Emotional Support Provided. Feelings of Sadness Regarding Loss of Independence Acknowledged. Caregiver Stress/Burnout Validated. Caregiver Resources Reviewed. Self-Enrollment in Caregiver Support Group of Interest Emphasized. Problem Solving Interventions Implemented. Task-Centered Solutions Employed.   Solution-Focused Strategies Activated. Acceptance &  Commitment Therapy Indicated. Cognitive Behavioral Therapy Performed. Client-Centered Therapy Initiated. CSW Collaboration with Niece, [name] to [employer] for [employer], through [facility] [place] [phone]), Due to [place] Exceeding Poverty Guidelines for [place] for [employer] 2024.  CSW Collaboration with Niece, [name] to Explain Inability to Apply for [employer],  through [facility] [phone]), Due to Ineligibility for Medicaid, through [facility] [place] [phone]). CSW Collaboration with Niece, [name] to Learn of Her Decision to Hire a Private Pay Caregiver, 5 Hours a Day, 2 Days a Week, to Assist with Bathing, Meal Preparation, Light Housekeeping Duties, Paramedic, etc. Please Remember to [place], if You Experience a Fall or Have a Medical Emergency. Please Continue to [employer] through Niece, [name] & Nephew, Gilmore Laroche. Please Contact CSW Directly (# [phone]), if You Have Questions, Need Assistance, or If Additional Social Work Needs Are Identified In The Near Future.      SDOH assessments and interventions completed:  Yes.  Care Coordination Interventions:  Yes, provided.   Follow up plan: No further intervention required.   Encounter Outcome:  Pt. Visit Completed.   Nat Christen, BSW, MSW, LCSW  Licensed Education officer, [facility]  Mailing [place] [address] [place], [place] [address] Physical Address-[address], [place], [place] [address] Toll Free Main # [phone] Fax # [phone] Cell # [phone] [email or web address]

## 2022-06-01 DIAGNOSIS — R531 Weakness: Secondary | ICD-10-CM | POA: Diagnosis not present

## 2022-06-01 DIAGNOSIS — R5381 Other malaise: Secondary | ICD-10-CM | POA: Diagnosis not present

## 2022-06-04 ENCOUNTER — Other Ambulatory Visit: Payer: Self-pay | Admitting: Pulmonary Disease

## 2022-06-07 DIAGNOSIS — J449 Chronic obstructive pulmonary disease, unspecified: Secondary | ICD-10-CM | POA: Diagnosis not present

## 2022-06-07 DIAGNOSIS — I480 Paroxysmal atrial fibrillation: Secondary | ICD-10-CM | POA: Diagnosis not present

## 2022-06-07 DIAGNOSIS — I5032 Chronic diastolic (congestive) heart failure: Secondary | ICD-10-CM | POA: Diagnosis not present

## 2022-06-10 ENCOUNTER — Encounter: Payer: Self-pay | Admitting: Pulmonary Disease

## 2022-06-10 ENCOUNTER — Ambulatory Visit: Payer: Medicare HMO | Admitting: Pulmonary Disease

## 2022-06-10 VITALS — BP 130/72 | HR 70 | Ht 60.0 in | Wt 132.0 lb

## 2022-06-10 DIAGNOSIS — J439 Emphysema, unspecified: Secondary | ICD-10-CM

## 2022-06-10 DIAGNOSIS — H8112 Benign paroxysmal vertigo, left ear: Secondary | ICD-10-CM

## 2022-06-10 DIAGNOSIS — G4736 Sleep related hypoventilation in conditions classified elsewhere: Secondary | ICD-10-CM

## 2022-06-10 DIAGNOSIS — R911 Solitary pulmonary nodule: Secondary | ICD-10-CM | POA: Diagnosis not present

## 2022-06-10 DIAGNOSIS — J449 Chronic obstructive pulmonary disease, unspecified: Secondary | ICD-10-CM

## 2022-06-10 NOTE — Patient Instructions (Signed)
Will schedule CT chest at [facility]  Follow up in 6 months

## 2022-06-10 NOTE — Progress Notes (Signed)
ne  [facility]  Chief Complaint  Patient presents with   Follow-up    No new concerns      Constitutional:  BP 130/72   Pulse 70   Ht 5' (1.524 m)   Wt 132 lb (59.9 kg)   SpO2 97% Comment: ra  BMI 25.78 kg/m   Past Medical History:  HTN, Sycope, GERD, A fib, Anxiety, Breast cancer, Depression, HTN, TIA, HLD  Past Surgical History:  Her  has a past surgical history that includes Cardiac catheterization; Total knee arthroplasty; Total hip arthroplasty; Rotator cuff repair; Mastectomy; ORIF ankle fracture (Left, 06/01/2014); Breast capsulectomy with implant exchange (Right, 05/27/2016); Esophagogastroduodenoscopy (06/2013); Colonoscopy (08/2014); Back surgery; Abdominal hysterectomy; Cholecystectomy; Appendectomy; Breast biopsy (Left); TEE without cardioversion (N/A, 05/17/2020); and Cardioversion (N/A, 05/17/2020).  Brief Summary:  Amanda Palmer is a 89 y.o. female with COPD and nocturnal hypoxemia.      Subjective:   She is here with her sister.  She was in hospital in January 2024 with syncope.  She had CT chest that showed a new spiculated nodule in the Rt lower lobe.  Breathing has been okay.  She needs to use albuterol one or two times per day when she gets coughing spells.  Not having wheeze or chest congestion.  She is using oxygen at night.  She has trouble getting dizzy and feeling like the room spins.  This happens when she turns her head to the left side and looks down.  She walks with a cane or a walker.  Physical Exam:   Appearance - well kempt, kyphotic, using a cane  ENMT - no sinus tenderness, no oral exudate, no LAN, Mallampati 3 airway, no stridor  Respiratory - decreased breath sounds bilaterally, no wheezing or rales  CV - s1s2 regular rate and rhythm, no murmurs  Ext - no clubbing, no edema  Skin - no rashes  Psych - normal mood and affect     Pulmonary testing:  PFT 11/09/13 >> FEV1 1.10 (54%), FEV1%  68, TLC 5.17 (102%) PFT 11/30/14 >> FEV1 0.89 (44%), FEV1% 64, TLC 4.42 (87%), RV 3.10 (130%), DLCO 22% RAST 02/23/15 >> negative, IgE 4 A1AT 07/06/19 >> 137, MM  Chest Imaging:  HRCT chest 05/07/19 >> atherosclerosis, scarring at lung bases, numerous small nodules clustered in RML and LLL CT chest 03/24/22 >> GGO along Rt apex, 1.2 x 0.9 cm spiculated subpleural nodule RLL, other nodules stable  Sleep Tests:  PSG 11/21/15 >> AHI 0.8, SpO2 low 90%  Cardiac Tests:  Echo 03/25/22 >> EF 60 to 65%, grade 2 DD, mod LA dilation, mild/mod MR  Social History:  She  reports that she has never smoked. She has never been exposed to tobacco smoke. She has never used smokeless tobacco. She reports that she does not drink alcohol and does not use drugs.  Family History:  Her family history includes Breast cancer in her sister, sister and another family member; Heart disease in her brother and sister; Leukemia in her brother.     Assessment/Plan:   COPD mixed type. - continue trelegy 100 one puff daily - prn xopenex - she has a nebulizer and flutter valve  Nocturnal hypoxemia 2nd to COPD. - continue 2 liters oxygen at night - goal SpO2 > 90%; she can use oxygen prn during the day  Rt lower lobe nodule. - seen on CT chest from January 2024  Benign positional vertigo. - discussed habituation exercises - if this  persists, then she is to follow up with her PCP  Persistent atrial fibrillation, chronic diastolic CHF, mitral regurgitation, HLD. - followed by Dr. Harl Bowie with [facility]  Time Spent Involved in Patient Care on Day of Examination:  38 minutes  Follow up:   Patient Instructions  Will schedule [facility] at [facility]  Follow up in 6 months  Medication List:   Allergies as of 06/10/2022       Reactions   Morphine    Other Reaction(s): GI Intolerance   Sulfamethoxazole    Other Reaction(s): GI Intolerance   Morphine And Related Nausea And Vomiting   Penicillins Rash    Reaction: unknown   Prednisone Anxiety, Nausea And Vomiting, Other (See Comments)   Makes patient not in right state of mind Other Reaction(s): GI Intolerance   Sulfa Antibiotics Nausea And Vomiting        Medication List        Accurate as of June 10, 2022 11:45 AM. If you have any questions, ask your nurse or doctor.          acetaminophen 500 MG tablet Commonly known as: TYLENOL Take 500 mg by mouth every 6 (six) hours as needed for moderate pain.   albuterol 108 (90 Base) MCG/ACT inhaler Commonly known as: VENTOLIN HFA Inhale 2 puffs into the lungs every 6 (six) hours as needed for wheezing or shortness of breath.   albuterol (2.5 MG/3ML) 0.083% nebulizer solution Commonly known as: PROVENTIL Take 2.5 mg by nebulization in the morning and at bedtime.   amiodarone 200 MG tablet Commonly known as: PACERONE TAKE ONE TABLET BY MOUTH ONCE DAILY   apixaban 5 MG Tabs tablet Commonly known as: Eliquis Take 1 tablet (5 mg total) by mouth 2 (two) times daily.   atorvastatin 20 MG tablet Commonly known as: LIPITOR Take 20 mg by mouth at bedtime.   clonazePAM 1 MG tablet Commonly known as: KLONOPIN Take 1 tablet (1 mg total) by mouth at bedtime.   esomeprazole 20 MG capsule Commonly known as: NEXIUM Take 20 mg by mouth daily.   Flutter Devi 1 Device by Does not apply route in the morning, at noon, in the evening, and at bedtime.   furosemide 20 MG tablet Commonly known as: LASIX Take 1 tablet (20 mg total) by mouth as needed for edema (swelling). What changed: when to take this   guaifenesin 100 MG/5ML syrup Commonly known as: ROBITUSSIN Take 100 mg by mouth 3 (three) times daily as needed for cough.   hydrocortisone 2.5 % rectal cream Commonly known as: ANUSOL-HC Place 1 Application rectally 2 (two) times daily.   losartan 50 MG tablet Commonly known as: COZAAR Take 50 mg by mouth daily.   Magnesium Oxide 400 MG Caps Take 1 capsule (400 mg total) by  mouth daily.   metoprolol succinate 50 MG 24 hr tablet Commonly known as: Toprol XL Take 1 tablet (50 mg total) by mouth daily. Take with or immediately following a meal.   multivitamin with minerals Tabs tablet Take 1 tablet by mouth daily.   OXYGEN Inhale 2 L into the lungs continuous.   potassium chloride 10 MEQ tablet Commonly known as: KLOR-CON Take 2 tablets (20 mEq total) by mouth daily.   Trelegy Ellipta 100-62.5-25 MCG/ACT Aepb Generic drug: Fluticasone-Umeclidin-Vilant INHALE ONE PUFF BY MOUTH INTO LUNGS DAILY   triamcinolone cream 0.5 % Commonly known as: KENALOG Apply 1 Application topically in the morning and at bedtime.   venlafaxine  75 MG tablet Commonly known as: EFFEXOR Take 75-150 mg by mouth 2 (two) times daily with a meal. Takes 2 tablets (150 mg) by mouth in the morning and take 1 tablet (75 mg) by mouth at night        Signature:  Chesley Mires, MD [place] Pager - [phone] 06/10/2022, 11:45 AM

## 2022-06-20 DIAGNOSIS — J449 Chronic obstructive pulmonary disease, unspecified: Secondary | ICD-10-CM | POA: Diagnosis not present

## 2022-06-21 ENCOUNTER — Ambulatory Visit: Payer: Self-pay | Admitting: *Deleted

## 2022-06-21 NOTE — Patient Outreach (Signed)
  Care Coordination   Follow Up Visit Note   09/10/2022 late entry for 06/21/22 Name: MAIYSHA FEHLER MRN: [id number] DOB: 06-26-1933  EMELIE HOUSEY is a 89 y.o. year old female who sees Margo Aye, Kathleene Hazel, MD for primary care. I spoke with  Alphonsus Sias by phone today.  What matters to the patients health and wellness today?  Reports she is doing okay Denies worsening shortness of breath Does report being a little dizzy in the last couple of weeks at various times but more when standing/moving. This last Wednesday when leaving church she felt dizzy She has noted when she gets hunger or make her bed she gets dizzy    Reviewed personal care services  Now states she has a lady that comes Tuesdays & Fridays twice a week      Goals Addressed               This Visit's Progress     Patient Stated     managing and coordination of home health aide services [facility]) (pt-stated)        Care Coordination Interventions: Discussed plans with patient for ongoing care management follow up and provided patient with direct contact information for care management team To have occupational therapy restart services Interventions Today    Flowsheet Row Most Recent Value  Chronic Disease   Chronic disease during today's visit Chronic Obstructive Pulmonary Disease (COPD), Other  General Interventions   General Interventions Discussed/Reviewed Doctor Visits  Doctor Visits Discussed/Reviewed Doctor Visits Reviewed, PCP  PCP/Specialist Visits Compliance with follow-up visit  Exercise Interventions   Exercise Discussed/Reviewed Exercise Discussed, Physical Activity  Pharmacy Interventions   Pharmacy Dicussed/Reviewed Medications and their functions, Pharmacy Topics Discussed              SDOH assessments and interventions completed:  No     Care Coordination Interventions:  Yes, provided   Follow up plan: Follow up call scheduled for 09/20/22    Encounter Outcome:  Pt. Visit Completed    Lydiann Bonifas L. Noelle Penner, RN, BSN, CCM [facility] Care Management Community Coordinator Office number [phone]

## 2022-06-24 ENCOUNTER — Ambulatory Visit (HOSPITAL_COMMUNITY)
Admission: RE | Admit: 2022-06-24 | Discharge: 2022-06-24 | Disposition: A | Discharge status: Home / Self Care | Payer: Medicare HMO | Source: Ambulatory Visit | Attending: Pulmonary Disease | Admitting: Pulmonary Disease

## 2022-06-24 DIAGNOSIS — R911 Solitary pulmonary nodule: Secondary | ICD-10-CM | POA: Insufficient documentation

## 2022-06-24 DIAGNOSIS — J432 Centrilobular emphysema: Secondary | ICD-10-CM | POA: Diagnosis not present

## 2022-06-27 DIAGNOSIS — T1490XA Injury, unspecified, initial encounter: Secondary | ICD-10-CM | POA: Diagnosis not present

## 2022-06-27 DIAGNOSIS — W19XXXA Unspecified fall, initial encounter: Secondary | ICD-10-CM | POA: Diagnosis not present

## 2022-07-08 DIAGNOSIS — J449 Chronic obstructive pulmonary disease, unspecified: Secondary | ICD-10-CM | POA: Diagnosis not present

## 2022-07-12 DIAGNOSIS — G3184 Mild cognitive impairment, so stated: Secondary | ICD-10-CM | POA: Diagnosis not present

## 2022-07-12 DIAGNOSIS — I4891 Unspecified atrial fibrillation: Secondary | ICD-10-CM | POA: Diagnosis not present

## 2022-07-12 DIAGNOSIS — M810 Age-related osteoporosis without current pathological fracture: Secondary | ICD-10-CM | POA: Diagnosis not present

## 2022-07-12 DIAGNOSIS — I252 Old myocardial infarction: Secondary | ICD-10-CM | POA: Diagnosis not present

## 2022-07-12 DIAGNOSIS — F419 Anxiety disorder, unspecified: Secondary | ICD-10-CM | POA: Diagnosis not present

## 2022-07-12 DIAGNOSIS — E785 Hyperlipidemia, unspecified: Secondary | ICD-10-CM | POA: Diagnosis not present

## 2022-07-12 DIAGNOSIS — I739 Peripheral vascular disease, unspecified: Secondary | ICD-10-CM | POA: Diagnosis not present

## 2022-07-12 DIAGNOSIS — I2510 Atherosclerotic heart disease of native coronary artery without angina pectoris: Secondary | ICD-10-CM | POA: Diagnosis not present

## 2022-07-12 DIAGNOSIS — D6869 Other thrombophilia: Secondary | ICD-10-CM | POA: Diagnosis not present

## 2022-07-12 DIAGNOSIS — M1990 Unspecified osteoarthritis, unspecified site: Secondary | ICD-10-CM | POA: Diagnosis not present

## 2022-07-12 DIAGNOSIS — K219 Gastro-esophageal reflux disease without esophagitis: Secondary | ICD-10-CM | POA: Diagnosis not present

## 2022-07-12 DIAGNOSIS — R2681 Unsteadiness on feet: Secondary | ICD-10-CM | POA: Diagnosis not present

## 2022-07-20 DIAGNOSIS — J449 Chronic obstructive pulmonary disease, unspecified: Secondary | ICD-10-CM | POA: Diagnosis not present

## 2022-07-26 DIAGNOSIS — S20221A Contusion of right back wall of thorax, initial encounter: Secondary | ICD-10-CM | POA: Diagnosis not present

## 2022-07-26 DIAGNOSIS — M549 Dorsalgia, unspecified: Secondary | ICD-10-CM | POA: Diagnosis not present

## 2022-07-26 DIAGNOSIS — S20229A Contusion of unspecified back wall of thorax, initial encounter: Secondary | ICD-10-CM | POA: Diagnosis not present

## 2022-07-26 DIAGNOSIS — Z79899 Other long term (current) drug therapy: Secondary | ICD-10-CM | POA: Diagnosis not present

## 2022-07-26 DIAGNOSIS — I10 Essential (primary) hypertension: Secondary | ICD-10-CM | POA: Diagnosis not present

## 2022-07-26 DIAGNOSIS — Z885 Allergy status to narcotic agent status: Secondary | ICD-10-CM | POA: Diagnosis not present

## 2022-07-26 DIAGNOSIS — M545 Low back pain, unspecified: Secondary | ICD-10-CM | POA: Diagnosis not present

## 2022-07-26 DIAGNOSIS — M546 Pain in thoracic spine: Secondary | ICD-10-CM | POA: Diagnosis not present

## 2022-07-26 DIAGNOSIS — S0000XA Unspecified superficial injury of scalp, initial encounter: Secondary | ICD-10-CM | POA: Diagnosis not present

## 2022-07-26 DIAGNOSIS — W228XXA Striking against or struck by other objects, initial encounter: Secondary | ICD-10-CM | POA: Diagnosis not present

## 2022-07-26 DIAGNOSIS — Z743 Need for continuous supervision: Secondary | ICD-10-CM | POA: Diagnosis not present

## 2022-07-26 DIAGNOSIS — R519 Headache, unspecified: Secondary | ICD-10-CM | POA: Diagnosis not present

## 2022-07-26 DIAGNOSIS — E785 Hyperlipidemia, unspecified: Secondary | ICD-10-CM | POA: Diagnosis not present

## 2022-07-26 DIAGNOSIS — S0990XA Unspecified injury of head, initial encounter: Secondary | ICD-10-CM | POA: Diagnosis not present

## 2022-07-26 DIAGNOSIS — Z882 Allergy status to sulfonamides status: Secondary | ICD-10-CM | POA: Diagnosis not present

## 2022-07-26 DIAGNOSIS — I959 Hypotension, unspecified: Secondary | ICD-10-CM | POA: Diagnosis not present

## 2022-07-26 DIAGNOSIS — S20222A Contusion of left back wall of thorax, initial encounter: Secondary | ICD-10-CM | POA: Diagnosis not present

## 2022-07-26 DIAGNOSIS — W010XXA Fall on same level from slipping, tripping and stumbling without subsequent striking against object, initial encounter: Secondary | ICD-10-CM | POA: Diagnosis not present

## 2022-07-26 DIAGNOSIS — Z880 Allergy status to penicillin: Secondary | ICD-10-CM | POA: Diagnosis not present

## 2022-07-30 ENCOUNTER — Ambulatory Visit: Payer: Medicare HMO | Attending: Cardiology | Admitting: Cardiology

## 2022-07-30 ENCOUNTER — Encounter: Payer: Self-pay | Admitting: Cardiology

## 2022-07-30 VITALS — BP 136/68 | HR 79 | Ht 60.0 in | Wt 131.0 lb

## 2022-07-30 DIAGNOSIS — E782 Mixed hyperlipidemia: Secondary | ICD-10-CM | POA: Diagnosis not present

## 2022-07-30 DIAGNOSIS — I10 Essential (primary) hypertension: Secondary | ICD-10-CM

## 2022-07-30 DIAGNOSIS — I480 Paroxysmal atrial fibrillation: Secondary | ICD-10-CM | POA: Diagnosis not present

## 2022-07-30 DIAGNOSIS — I5032 Chronic diastolic (congestive) heart failure: Secondary | ICD-10-CM | POA: Diagnosis not present

## 2022-07-30 MED ORDER — LOSARTAN POTASSIUM 25 MG PO TABS: 25.0000 mg | ORAL_TABLET | Freq: Every day | ORAL | 6 refills | Status: DC

## 2022-07-30 NOTE — Progress Notes (Signed)
Clinical Summary Amanda Palmer is a 89 y.o.female seen today for follow up of the following medical problems.      1. Afib/acquired thrombophilia - from notes not an ablation candidate - had been on dofetilide, change to amio due to long QT      - no palpitatins - no bleeding on eliquis. Wt right at 57kg, age 40, Cr 0.9  Recent falls 05/2022 monitor Sinus rhythm rate 51-94, avg 63 There was one 17 beat run of SVT at 118 bpm - does report some orthostatic dizziness, sometimes prior  - poor oral hydration.    2. Chronic systolic HF - 12/2019 TTE: LVEF 60-65% - 05/2020 TEE: LVEF 30-35%. This was around the time she had issues with afib with RVR - medical therapy limited by low bp's       09/2020 echo LVEF 60-65% - Jan 2024 echo: LVEF 60-65%, grade II dd - breathing overall doing well       3. HTN - compliant with meds   10/2020 afib clinic 130/60     4. Hyperlpidemia - on statin     09/2021 TC 132 TG 109 HDL 61 LDL 51 02/2022 TC 164 LDL 67 TG 80 HDL 80   5. SOB/Chronic lung disease - on home O2, followed by pulmonary     6. Vocal cord dysfunction     6. Valvular heart disease - 05/2020 TEE mod to severe, mod MR, LVEF 30-35% 09/2020 trivial MR, normalized EF   - apepars to have been funcitonal MR that has resolved.    7. Mechanical fall - recent mechanical fall just a few days ago, suffered some rib fractures         Sister of Amanda Palmer, former patient of mine who passed way in 2020 Past Medical History:  Diagnosis Date   A-fib (HCC)    Anxiety    Arthritis    Cancer (HCC)    breast   COPD (chronic obstructive pulmonary disease) (HCC)    Depression    GERD (gastroesophageal reflux disease)    Hypertension    TIA (transient ischemic attack)    remote     Allergies  Allergen Reactions   Morphine     Other Reaction(s): GI Intolerance   Sulfamethoxazole     Other Reaction(s): GI Intolerance   Morphine And Codeine Nausea And Vomiting    Penicillins Rash    Reaction: unknown   Prednisone Anxiety, Nausea And Vomiting and Other (See Comments)    Makes patient not in right state of mind  Other Reaction(s): GI Intolerance   Sulfa Antibiotics Nausea And Vomiting     Current Outpatient Medications  Medication Sig Dispense Refill   acetaminophen (TYLENOL) 500 MG tablet Take 500 mg by mouth every 6 (six) hours as needed for moderate pain.     albuterol (PROVENTIL HFA;VENTOLIN HFA) 108 (90 Base) MCG/ACT inhaler Inhale 2 puffs into the lungs every 6 (six) hours as needed for wheezing or shortness of breath.     albuterol (PROVENTIL) (2.5 MG/3ML) 0.083% nebulizer solution Take 2.5 mg by nebulization in the morning and at bedtime.     amiodarone (PACERONE) 200 MG tablet TAKE ONE TABLET BY MOUTH ONCE DAILY 30 tablet 5   apixaban (ELIQUIS) 5 MG TABS tablet Take 1 tablet (5 mg total) by mouth 2 (two) times daily. 60 tablet 6   atorvastatin (LIPITOR) 20 MG tablet Take 20 mg by mouth at bedtime.     clonazePAM (  KLONOPIN) 1 MG tablet Take 1 tablet (1 mg total) by mouth at bedtime. 10 tablet 0   esomeprazole (NEXIUM) 20 MG capsule Take 20 mg by mouth daily.     furosemide (LASIX) 20 MG tablet Take 1 tablet (20 mg total) by mouth as needed for edema (swelling). (Patient taking differently: Take 20 mg by mouth daily as needed for edema (swelling).)     guaifenesin (ROBITUSSIN) 100 MG/5ML syrup Take 100 mg by mouth 3 (three) times daily as needed for cough.     hydrocortisone (ANUSOL-HC) 2.5 % rectal cream Place 1 Application rectally 2 (two) times daily. 30 g 0   losartan (COZAAR) 50 MG tablet Take 50 mg by mouth daily.     Magnesium Oxide 400 MG CAPS Take 1 capsule (400 mg total) by mouth daily. 90 capsule 3   metoprolol succinate (TOPROL XL) 50 MG 24 hr tablet Take 1 tablet (50 mg total) by mouth daily. Take with or immediately following a meal. 30 tablet 3   Multiple Vitamin (MULTIVITAMIN WITH MINERALS) TABS tablet Take 1 tablet by mouth  daily.     OXYGEN Inhale 2 L into the lungs continuous.     potassium chloride (KLOR-CON) 10 MEQ tablet Take 2 tablets (20 mEq total) by mouth daily. 180 tablet 3   Respiratory Therapy Supplies (FLUTTER) DEVI 1 Device by Does not apply route in the morning, at noon, in the evening, and at bedtime. 1 each 0   TRELEGY ELLIPTA 100-62.5-25 MCG/ACT AEPB INHALE ONE PUFF BY MOUTH INTO LUNGS DAILY 60 each 2   triamcinolone cream (KENALOG) 0.5 % Apply 1 Application topically in the morning and at bedtime.     venlafaxine (EFFEXOR) 75 MG tablet Take 75-150 mg by mouth 2 (two) times daily with a meal. Takes 2 tablets (150 mg) by mouth in the morning and take 1 tablet (75 mg) by mouth at night     No current facility-administered medications for this visit.     Past Surgical History:  Procedure Laterality Date   ABDOMINAL HYSTERECTOMY     APPENDECTOMY     BACK SURGERY     total of five surgeries   BREAST BIOPSY Left    benign   BREAST CAPSULECTOMY WITH IMPLANT EXCHANGE Right 05/27/2016   Procedure: REMOVAL AND REPLACEMENT OF RIGHT BREAST IMPLANT AND BREAST CAPSULECTOMY;  Surgeon: Louisa Second, MD;  Location: [facility];  Service: Plastics;  Laterality: Right;   CARDIAC CATHETERIZATION     CARDIOVERSION N/A 05/17/2020   Procedure: CARDIOVERSION;  Surgeon: Elease Hashimoto Deloris Ping, MD;  Location: [facility] ENDOSCOPY;  Service: Cardiovascular;  Laterality: N/A;   CHOLECYSTECTOMY     COLONOSCOPY  08/2014   Dr. Teena Dunk: Small sessile polyp at the cecum, removed, tubular adenoma   ESOPHAGOGASTRODUODENOSCOPY  06/2013   Dr. Teena Dunk: Hiatal hernia, Schatzki ring, nonobstructive.   MASTECTOMY     right    ORIF ANKLE FRACTURE Left 06/01/2014   Procedure: OPEN REDUCTION INTERNAL FIXATION (ORIF) LEFT ANKLE FRACTURE;  Surgeon: Eldred Manges, MD;  Location: [facility] OR;  Service: Orthopedics;  Laterality: Left;   ROTATOR CUFF REPAIR     left   TEE WITHOUT CARDIOVERSION N/A 05/17/2020   Procedure: TRANSESOPHAGEAL  ECHOCARDIOGRAM (TEE);  Surgeon: Elease Hashimoto Deloris Ping, MD;  Location: [facility] ENDOSCOPY;  Service: Cardiovascular;  Laterality: N/A;   TOTAL HIP ARTHROPLASTY     right   TOTAL KNEE ARTHROPLASTY     bilateral     Allergies  Allergen Reactions  Morphine     Other Reaction(s): GI Intolerance   Sulfamethoxazole     Other Reaction(s): GI Intolerance   Morphine And Codeine Nausea And Vomiting   Penicillins Rash    Reaction: unknown   Prednisone Anxiety, Nausea And Vomiting and Other (See Comments)    Makes patient not in right state of mind  Other Reaction(s): GI Intolerance   Sulfa Antibiotics Nausea And Vomiting      Family History  Problem Relation Age of Onset   Heart disease Brother    Heart disease Sister    Breast cancer Sister    Leukemia Brother    Breast cancer Sister    Breast cancer Other        one deceased, one living   Colon cancer Neg Hx      Social History Ms. Barlow reports that she has never smoked. She has never been exposed to tobacco smoke. She has never used smokeless tobacco. Ms. Luciano reports no history of alcohol use.   Review of Systems CONSTITUTIONAL: No weight loss, fever, chills, weakness or fatigue.  HEENT: Eyes: No visual loss, blurred vision, double vision or yellow sclerae.No hearing loss, sneezing, congestion, runny nose or sore throat.  SKIN: No rash or itching.  CARDIOVASCULAR: per hpi RESPIRATORY: No shortness of breath, cough or sputum.  GASTROINTESTINAL: No anorexia, nausea, vomiting or diarrhea. No abdominal pain or blood.  GENITOURINARY: No burning on urination, no polyuria NEUROLOGICAL: No headache, dizziness, syncope, paralysis, ataxia, numbness or tingling in the extremities. No change in bowel or bladder control.  MUSCULOSKELETAL: No muscle, back pain, joint pain or stiffness.  LYMPHATICS: No enlarged nodes. No history of splenectomy.  PSYCHIATRIC: No history of depression or anxiety.  ENDOCRINOLOGIC: No reports of sweating, cold  or heat intolerance. No polyuria or polydipsia.  Marland Kitchen   Physical Examination Today's Vitals   07/30/22 1410  BP: 136/68  Pulse: 79  SpO2: 95%  Weight: 131 lb (59.4 kg)  Height: 5' (1.524 m)   Body mass index is 25.58 kg/m.  Gen: resting comfortably, no acute distress HEENT: no scleral icterus, pupils equal round and reactive, no palptable cervical adenopathy,  CV: RRR, no mrg, no jvd Resp: Clear to auscultation bilaterally GI: abdomen is soft, non-tender, non-distended, normal bowel sounds, no hepatosplenomegaly MSK: extremities are warm, no edema.  Skin: warm, no rash Neuro:  no focal deficits Psych: appropriate affect     Assessment and Plan   Afib/acquired thrombophilia - no symptoms, followed in afib clinic. Currently on amio - continue eliquis for stroke prevention   2. HFimpEF -prior drop in LVEF likely tachymediated as it was in the setting of uncontrolled afib - LVEF has normalized - no symptoms, continue to monitor  3. Falls - mechanical, does at times have some orthostatic symptoms prior to falls - poor oral hydration, encouraged more regular fluid intake. Lower losartan to 25mg  daily.  - recent monitor without arrhythmogenic cause for falls   4. HTN - with some orthostatic symptoms lower losartan to 25mg  daily, accept higher bp's at this time   5. Hyperlipidemia -at goal, continue current meds     Antoine Poche, M.D.

## 2022-07-30 NOTE — Patient Instructions (Signed)
Medication Instructions:  Decrease Losartan to 25mg daily  Continue all other medications.     Labwork: none  Testing/Procedures: none  Follow-Up: 6 months   Any Other Special Instructions Will Be Listed Below (If Applicable).   If you need a refill on your cardiac medications before your next appointment, please call your pharmacy.  

## 2022-07-31 ENCOUNTER — Other Ambulatory Visit: Payer: Self-pay

## 2022-07-31 DIAGNOSIS — R911 Solitary pulmonary nodule: Secondary | ICD-10-CM

## 2022-08-02 ENCOUNTER — Telehealth: Payer: Self-pay | Admitting: *Deleted

## 2022-08-02 NOTE — Telephone Encounter (Signed)
   Pre-operative Risk Assessment    Patient Name: Amanda Palmer  DOB: 09-Aug-1933 MRN: [id number]     Request for Surgical Clearance{ 1. What type of surgery is being performed? Enter name of procedure below and number of teeth if dental extraction.     Procedure:  Dental Extraction - Amount of Teeth to be Pulled:  ( 2) # 14 &  15  Date of Surgery:  Clearance TBD                                 Surgeon:  Dr. Graylon Gunning Surgeon's Group or Practice Name:  [facility] Phone number:  [phone] Fax number:  [phone]   Type of Clearance Requested:   - Medical  - Pharmacy:  Hold Apixaban (Eliquis) ? How many day    Type of Anesthesia:  Local    Additional requests/questions:    Elvin So   08/02/2022, 4:20 PM

## 2022-08-02 NOTE — Telephone Encounter (Signed)
   Patient Name: Amanda Palmer  DOB: [birth year] MRN: [id number]  Primary Cardiologist: Dina Rich, MD  Chart reviewed as part of pre-operative protocol coverage.   Simple dental extractions (i.e. 1-2 teeth) are considered low risk procedures per guidelines and generally do not require any specific cardiac clearance. It is also generally accepted that for simple extractions and dental cleanings, there is no need to interrupt blood thinner therapy.   SBE prophylaxis is not required for the patient from a cardiac standpoint.  I will route this recommendation to the requesting party via Epic fax function and remove from pre-op pool.  Please call with questions.  Napoleon Form, Leodis Rains, NP 08/02/2022, 4:29 PM

## 2022-08-07 ENCOUNTER — Telehealth: Payer: Self-pay | Admitting: Cardiology

## 2022-08-07 DIAGNOSIS — J449 Chronic obstructive pulmonary disease, unspecified: Secondary | ICD-10-CM | POA: Diagnosis not present

## 2022-08-07 NOTE — Telephone Encounter (Signed)
   Pre-operative Risk Assessment    Patient Name: Amanda Palmer  DOB: 05-24-1933 MRN: [id number]     Request for Surgical Clearance    Procedure:  Dental Extraction - Amount of Teeth to be Pulled:  Surgical extractions of multiply teeth with osteoplasty   Date of Surgery:  Clearance TBD                                 Surgeon:  Graylon Gunning  Surgeon's Group or Practice Name:  [facility]  Phone number:  [phone] Fax number:  [phone]    Type of Clearance Requested:   - Pharmacy:  Hold Apixaban (Eliquis) 3?   Type of Anesthesia:  Local    Additional requests/questions:    [name]   08/07/2022, 5:57 PM

## 2022-08-08 DIAGNOSIS — R55 Syncope and collapse: Secondary | ICD-10-CM | POA: Diagnosis not present

## 2022-08-08 DIAGNOSIS — R296 Repeated falls: Secondary | ICD-10-CM | POA: Diagnosis not present

## 2022-08-08 DIAGNOSIS — R911 Solitary pulmonary nodule: Secondary | ICD-10-CM | POA: Diagnosis not present

## 2022-08-08 DIAGNOSIS — I10 Essential (primary) hypertension: Secondary | ICD-10-CM | POA: Diagnosis not present

## 2022-08-08 DIAGNOSIS — H05239 Hemorrhage of unspecified orbit: Secondary | ICD-10-CM | POA: Diagnosis not present

## 2022-08-08 DIAGNOSIS — R419 Unspecified symptoms and signs involving cognitive functions and awareness: Secondary | ICD-10-CM | POA: Diagnosis not present

## 2022-08-08 DIAGNOSIS — I482 Chronic atrial fibrillation, unspecified: Secondary | ICD-10-CM | POA: Diagnosis not present

## 2022-08-08 DIAGNOSIS — F419 Anxiety disorder, unspecified: Secondary | ICD-10-CM | POA: Diagnosis not present

## 2022-08-08 DIAGNOSIS — R4189 Other symptoms and signs involving cognitive functions and awareness: Secondary | ICD-10-CM | POA: Diagnosis not present

## 2022-08-08 DIAGNOSIS — K219 Gastro-esophageal reflux disease without esophagitis: Secondary | ICD-10-CM | POA: Diagnosis not present

## 2022-08-08 DIAGNOSIS — J449 Chronic obstructive pulmonary disease, unspecified: Secondary | ICD-10-CM | POA: Diagnosis not present

## 2022-08-08 DIAGNOSIS — S0990XD Unspecified injury of head, subsequent encounter: Secondary | ICD-10-CM | POA: Diagnosis not present

## 2022-08-09 NOTE — Telephone Encounter (Signed)
     Primary Cardiologist: Dina Rich, MD  Chart reviewed as part of pre-operative protocol coverage. Given past medical history and time since last visit, based on ACC/AHA guidelines, Amanda Palmer would be at acceptable risk for the planned procedure without further cardiovascular testing.   Patient with diagnosis of atrial fibrillaton on Eliquis for anticoagulation.     Procedure:  Dental Extraction - Amount of Teeth to be Pulled:  Surgical extractions of multiply teeth with osteoplasty    Date of Surgery:  Clearance TBD      CHA2DS2-VASc Score = 6   This indicates a 9.7% annual risk of stroke. The patient's score is based upon: CHF History: 0 HTN History: 1 Diabetes History: 0 Stroke History: 2 Vascular Disease History: 0 Age Score: 2 Gender Score: 1   Chart indicates remote history of TIA   CrCl 42 Platelet count 256   Patient does not require pre-op antibiotics for dental procedure.   Per office protocol, patient can hold Eliquis for 2 days prior to procedure.   Patient will not need bridging with Lovenox (enoxaparin) around procedure.  I will route this recommendation to the requesting party via Epic fax function and remove from pre-op pool.  Please call with questions.  Thomasene Ripple. Shenee Wignall NP-C     08/09/2022, 10:07 AM [facility] [address] Office [phone] Fax [phone]

## 2022-08-09 NOTE — Telephone Encounter (Signed)
Patient with diagnosis of atrial fibrillaton on Eliquis for anticoagulation.    Procedure:  Dental Extraction - Amount of Teeth to be Pulled:  Surgical extractions of multiply teeth with osteoplasty    Date of Surgery:  Clearance TBD    CHA2DS2-VASc Score = 6   This indicates a 9.7% annual risk of stroke. The patient's score is based upon: CHF History: 0 HTN History: 1 Diabetes History: 0 Stroke History: 2 Vascular Disease History: 0 Age Score: 2 Gender Score: 1   Chart indicates remote history of TIA  CrCl 42 Platelet count 256  Patient does not require pre-op antibiotics for dental procedure.  Per office protocol, patient can hold Eliquis for 2 days prior to procedure.   Patient will not need bridging with Lovenox (enoxaparin) around procedure.  **This guidance is not considered finalized until pre-operative APP has relayed final recommendations.**

## 2022-08-13 DIAGNOSIS — I739 Peripheral vascular disease, unspecified: Secondary | ICD-10-CM | POA: Diagnosis not present

## 2022-08-13 DIAGNOSIS — L110 Acquired keratosis follicularis: Secondary | ICD-10-CM | POA: Diagnosis not present

## 2022-08-13 DIAGNOSIS — M79671 Pain in right foot: Secondary | ICD-10-CM | POA: Diagnosis not present

## 2022-08-13 DIAGNOSIS — M79672 Pain in left foot: Secondary | ICD-10-CM | POA: Diagnosis not present

## 2022-08-20 DIAGNOSIS — J449 Chronic obstructive pulmonary disease, unspecified: Secondary | ICD-10-CM | POA: Diagnosis not present

## 2022-08-26 DIAGNOSIS — R55 Syncope and collapse: Secondary | ICD-10-CM | POA: Diagnosis not present

## 2022-08-26 DIAGNOSIS — Z0000 Encounter for general adult medical examination without abnormal findings: Secondary | ICD-10-CM | POA: Diagnosis not present

## 2022-08-26 DIAGNOSIS — M818 Other osteoporosis without current pathological fracture: Secondary | ICD-10-CM | POA: Diagnosis not present

## 2022-08-26 DIAGNOSIS — K219 Gastro-esophageal reflux disease without esophagitis: Secondary | ICD-10-CM | POA: Diagnosis not present

## 2022-08-26 DIAGNOSIS — E782 Mixed hyperlipidemia: Secondary | ICD-10-CM | POA: Diagnosis not present

## 2022-08-26 DIAGNOSIS — R419 Unspecified symptoms and signs involving cognitive functions and awareness: Secondary | ICD-10-CM | POA: Diagnosis not present

## 2022-08-26 DIAGNOSIS — H6123 Impacted cerumen, bilateral: Secondary | ICD-10-CM | POA: Diagnosis not present

## 2022-08-26 DIAGNOSIS — S0990XD Unspecified injury of head, subsequent encounter: Secondary | ICD-10-CM | POA: Diagnosis not present

## 2022-08-26 DIAGNOSIS — I482 Chronic atrial fibrillation, unspecified: Secondary | ICD-10-CM | POA: Diagnosis not present

## 2022-08-26 DIAGNOSIS — J449 Chronic obstructive pulmonary disease, unspecified: Secondary | ICD-10-CM | POA: Diagnosis not present

## 2022-08-26 DIAGNOSIS — R7301 Impaired fasting glucose: Secondary | ICD-10-CM | POA: Diagnosis not present

## 2022-08-26 DIAGNOSIS — R296 Repeated falls: Secondary | ICD-10-CM | POA: Diagnosis not present

## 2022-08-26 DIAGNOSIS — H05239 Hemorrhage of unspecified orbit: Secondary | ICD-10-CM | POA: Diagnosis not present

## 2022-08-26 DIAGNOSIS — R911 Solitary pulmonary nodule: Secondary | ICD-10-CM | POA: Diagnosis not present

## 2022-08-26 DIAGNOSIS — F419 Anxiety disorder, unspecified: Secondary | ICD-10-CM | POA: Diagnosis not present

## 2022-09-02 ENCOUNTER — Other Ambulatory Visit: Payer: Self-pay | Admitting: Pulmonary Disease

## 2022-09-02 ENCOUNTER — Other Ambulatory Visit: Payer: Self-pay | Admitting: Student

## 2022-09-07 DIAGNOSIS — J449 Chronic obstructive pulmonary disease, unspecified: Secondary | ICD-10-CM | POA: Diagnosis not present

## 2022-09-10 NOTE — Patient Instructions (Addendum)
Visit Information  Thank you for taking time to visit with me today. Please don't hesitate to contact me if I can be of assistance to you.   Following are the goals we discussed today:   Goals Addressed               This Visit's Progress     Patient Stated     managing and coordination of home health aide services [facility]) (pt-stated)        Care Coordination Interventions: Discussed plans with patient for ongoing care management follow up and provided patient with direct contact information for care management team To have occupational therapy restart services Interventions Today    Flowsheet Row Most Recent Value  Chronic Disease   Chronic disease during today's visit Chronic Obstructive Pulmonary Disease (COPD), Other  General Interventions   General Interventions Discussed/Reviewed Doctor Visits  Doctor Visits Discussed/Reviewed Doctor Visits Reviewed, PCP  PCP/Specialist Visits Compliance with follow-up visit  Exercise Interventions   Exercise Discussed/Reviewed Exercise Discussed, Physical Activity  Pharmacy Interventions   Pharmacy Dicussed/Reviewed Medications and their functions, Pharmacy Topics Discussed              Our next appointment is by telephone on 7.12/24 at 2:15 pm  Please call the care guide team at [phone] if you need to cancel or reschedule your appointment.   If you are experiencing a Mental Health or Behavioral Health Crisis or need someone to talk to, please call the Suicide and Crisis Lifeline: 988 call the [place] National Suicide Prevention Lifeline: 210-865-9138 or TTY: 316-055-6085 TTY 979-040-0994) to talk to a trained counselor call 1-800-273-TALK (toll free, 24 hour hotline) call the [facility]: 626 713 2963 call 911   Patient verbalizes understanding of instructions and care plan provided today and agrees to view in MyChart. Active MyChart status and patient understanding of how to access instructions and care  plan via MyChart confirmed with patient.     The patient has been provided with contact information for the care management team and has been advised to call with any health related questions or concerns.   Kortez Murtagh L. Noelle Penner, RN, BSN, CCM [facility] Care Management Community Coordinator Office number [phone]

## 2022-09-19 DIAGNOSIS — J449 Chronic obstructive pulmonary disease, unspecified: Secondary | ICD-10-CM | POA: Diagnosis not present

## 2022-09-20 ENCOUNTER — Ambulatory Visit: Payer: Self-pay | Admitting: *Deleted

## 2022-09-20 NOTE — Patient Outreach (Signed)
  Care Coordination   09/20/2022 Name: Amanda Palmer MRN: [id number] DOB: [birth year]   Care Coordination Outreach Attempts:  An unsuccessful telephone outreach was attempted for a scheduled appointment today.  Follow Up Plan:  Additional outreach attempts will be made to offer the patient care coordination information and services.   Encounter Outcome:  No Answer  Unable to leave a voice message, mailbox is full, sent mychart text  Care Coordination Interventions:  No, not indicated    Danarius Mcconathy L. Noelle Penner, RN, BSN, CCM [facility] Care Management Community Coordinator Office number [phone]

## 2022-10-03 ENCOUNTER — Telehealth: Payer: Self-pay | Admitting: *Deleted

## 2022-10-03 NOTE — Progress Notes (Signed)
  Care Coordination Note  10/03/2022 Name: Amanda Palmer MRN: [id number] DOB: [birth year]  Amanda Palmer is a 89 y.o. year old female who is a primary care patient of Benita Stabile, MD and is actively engaged with the care management team. I reached out to Alphonsus Sias by phone today to assist with re-scheduling a follow up visit with the RN Case Manager  Follow up plan: Unsuccessful telephone outreach attempt made. A HIPAA compliant phone message was left for the patient providing contact information and requesting a return call.   [facility]  Care Coordination Care Guide  Direct Dial: [phone]

## 2022-10-07 DIAGNOSIS — J449 Chronic obstructive pulmonary disease, unspecified: Secondary | ICD-10-CM | POA: Diagnosis not present

## 2022-10-09 NOTE — Progress Notes (Signed)
  Care Coordination Note  10/09/2022 Name: Amanda Palmer MRN: [id number] DOB: [birth year]  [name] is a 89 y.o. year old female who is a primary care patient of Benita Stabile, MD and is actively engaged with the care management team. I reached out to Alphonsus Sias by phone today to assist with re-scheduling a follow up visit with the RN Case Manager  Follow up plan: Telephone appointment with care management team member scheduled for:10/16/22  [facility] Coordination Care Guide  Direct Dial: [phone]

## 2022-10-16 ENCOUNTER — Ambulatory Visit: Payer: Self-pay | Admitting: *Deleted

## 2022-10-16 NOTE — Patient Instructions (Addendum)
Visit Information  Thank you for taking time to visit with me today. Please don't hesitate to contact me if I can be of assistance to you.   Following are the goals we discussed today:   Goals Addressed   None     Our next appointment is by telephone on 11/15/22 at 10 am  Please call the care guide team at [phone] if you need to cancel or reschedule your appointment.   If you are experiencing a Mental Health or Behavioral Health Crisis or need someone to talk to, please call the Suicide and Crisis Lifeline: 988 call the [place] National Suicide Prevention Lifeline: (305) 252-4196 or TTY: 256-684-9258 TTY 947-083-0978) to talk to a trained counselor call 1-800-273-TALK (toll free, 24 hour hotline) call the [facility]: 901-408-8440 call 911   Patient verbalizes understanding of instructions and care plan provided today and agrees to view in MyChart. Active MyChart status and patient understanding of how to access instructions and care plan via MyChart confirmed with patient.     The patient has been provided with contact information for the care management team and has been advised to call with any health related questions or concerns.    L. Noelle Penner, RN, BSN, CCM [facility] Care Management Community Coordinator Office number [phone]

## 2022-10-16 NOTE — Patient Outreach (Signed)
  Care Coordination   Follow Up Visit Note   10/16/2022 Name: Amanda Palmer MRN: [id number] DOB: 1934-03-01  Amanda Palmer is a 89 y.o. year old female who sees Margo Aye, Kathleene Hazel, MD for primary care. I spoke with  Alphonsus Sias by phone today.  What matters to the patients health and wellness today?  Constipation hearing difficulties    Goals Addressed               This Visit's Progress     Patient Stated     COMPLETED: managing and coordination of home health aide services [facility]) (pt-stated)        Care Coordination Interventions: Discussed plans with patient for ongoing care management follow up and provided patient with direct contact information for care management team To have occupational therapy restart services Now has a sitter during the week Goal completed      COMPLETED: Medical Transportation [facility]) (pt-stated)        Care Coordination Interventions: Confirmed transportation assistance from her niece and nephew as  & no [employer] Medicare transportation benefit on her plan [employer] transportation Resources for living at [phone] Goal met      Other     [facility] nurse care coordination services (constipation, hearing, medications)   Not on track     Interventions Today    Flowsheet Row Most Recent Value  Chronic Disease   Chronic disease during today's visit Other, Chronic Obstructive Pulmonary Disease (COPD), Atrial Fibrillation (AFib)  [She states this week her main concern has been not feeling well after taking a laxative for constipation. She reports she believes she is not feeling well also related to "my nerves. Something got me really upset" Hearing aid need repair]  General Interventions   General Interventions Discussed/Reviewed [facility], Doctor Visits, Horticulturist, commercial (DME), General Interventions Reviewed  [She recently had the impactin removed from her ears but goes next week to have hearing aid repaired]  Doctor Visits Discussed/Reviewed  Doctor Visits Reviewed, Specialist, PCP  [pcp & audiologist]  PCP/Specialist Visits Compliance with follow-up visit  Education Interventions   Education Provided Provided Education  [constipation, anxiety, hearing aid repair]  Provided Verbal Education On Mental Health/Coping with Illness, Medication, Community Resources  Mental Health Interventions   Mental Health Discussed/Reviewed Mental Health Reviewed, Coping Strategies  Nutrition Interventions   Nutrition Discussed/Reviewed Nutrition Discussed, Decreasing salt  Pharmacy Interventions   Pharmacy Dicussed/Reviewed Pharmacy Topics Reviewed, Medications and their functions              SDOH assessments and interventions completed:  No     Care Coordination Interventions:  Yes, provided   Follow up plan: Follow up call scheduled for 11/15/22    Encounter Outcome:  Pt. Visit Completed    L. Noelle Penner, RN, BSN, CCM [facility] Care Management Community Coordinator Office number [phone]

## 2022-10-20 DIAGNOSIS — J449 Chronic obstructive pulmonary disease, unspecified: Secondary | ICD-10-CM | POA: Diagnosis not present

## 2022-10-22 DIAGNOSIS — L110 Acquired keratosis follicularis: Secondary | ICD-10-CM | POA: Diagnosis not present

## 2022-10-22 DIAGNOSIS — M79672 Pain in left foot: Secondary | ICD-10-CM | POA: Diagnosis not present

## 2022-10-22 DIAGNOSIS — I739 Peripheral vascular disease, unspecified: Secondary | ICD-10-CM | POA: Diagnosis not present

## 2022-10-22 DIAGNOSIS — M79671 Pain in right foot: Secondary | ICD-10-CM | POA: Diagnosis not present

## 2022-10-29 ENCOUNTER — Other Ambulatory Visit: Payer: Self-pay | Admitting: Cardiology

## 2022-11-07 DIAGNOSIS — J449 Chronic obstructive pulmonary disease, unspecified: Secondary | ICD-10-CM | POA: Diagnosis not present

## 2022-11-15 ENCOUNTER — Encounter: Payer: Self-pay | Admitting: *Deleted

## 2022-11-15 ENCOUNTER — Other Ambulatory Visit (HOSPITAL_BASED_OUTPATIENT_CLINIC_OR_DEPARTMENT_OTHER): Payer: Self-pay | Admitting: Pulmonary Disease

## 2022-11-15 ENCOUNTER — Ambulatory Visit: Payer: Self-pay | Admitting: *Deleted

## 2022-11-15 ENCOUNTER — Ambulatory Visit: Payer: Medicare HMO | Attending: Cardiovascular Disease | Admitting: Cardiovascular Disease

## 2022-11-15 ENCOUNTER — Encounter: Payer: Self-pay | Admitting: Cardiovascular Disease

## 2022-11-15 VITALS — BP 114/72 | HR 60 | Ht 64.0 in | Wt 129.8 lb

## 2022-11-15 DIAGNOSIS — I480 Paroxysmal atrial fibrillation: Secondary | ICD-10-CM | POA: Diagnosis not present

## 2022-11-15 DIAGNOSIS — Z79899 Other long term (current) drug therapy: Secondary | ICD-10-CM

## 2022-11-15 MED ORDER — APIXABAN 2.5 MG PO TABS: 2.5000 mg | ORAL_TABLET | Freq: Two times a day (BID) | ORAL | 6 refills | Status: AC

## 2022-11-15 NOTE — Patient Instructions (Addendum)
Medication Instructions:   Decrease Eliquis to 2.5mg  twice a day   Continue all other medications.     Labwork:  TSH, T4 - orders given today Office will contact with results via phone, letter or mychart.     Testing/Procedures:  none  Follow-Up:  6 months   Any Other Special Instructions Will Be Listed Below (If Applicable).   If you need a refill on your cardiac medications before your next appointment, please call your pharmacy.

## 2022-11-15 NOTE — Patient Outreach (Signed)
  Care Coordination   11/15/2022 Name: Amanda Palmer MRN: [id number] DOB: 15-Jul-1933   Care Coordination Outreach Attempts:  An unsuccessful telephone outreach was attempted for a scheduled appointment today.  Follow Up Plan:  Additional outreach attempts will be made to offer the patient care coordination information and services.   Encounter Outcome:  No Answer   Care Coordination Interventions:  No, not indicated     Kathey Simer L. Noelle Penner, RN, BSN, CCM, Care Management Coordinator [phone]
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
## 2022-11-20 DIAGNOSIS — J449 Chronic obstructive pulmonary disease, unspecified: Secondary | ICD-10-CM | POA: Diagnosis not present

## 2022-11-22 ENCOUNTER — Telehealth: Payer: Self-pay | Admitting: *Deleted

## 2022-11-22 NOTE — Progress Notes (Signed)
Care Coordination Note  11/22/2022 Name: Amanda Palmer MRN: [id number] DOB: 02-14-1934  Amanda Palmer is a 89 y.o. year old female who is a primary care patient of Benita Stabile, MD and is actively engaged with the care management team. I reached out to Alphonsus Sias by phone today to assist with re-scheduling a follow up visit with the RN Case Manager  Follow up plan: Unsuccessful telephone outreach attempt made. A HIPAA compliant phone message was left for the patient providing contact information and requesting a return call.   [facility]  Care Coordination Care Guide  Direct Dial: [phone]

## 2022-11-28 ENCOUNTER — Ambulatory Visit (HOSPITAL_BASED_OUTPATIENT_CLINIC_OR_DEPARTMENT_OTHER): Payer: Medicare HMO | Admitting: Pulmonary Disease

## 2022-11-28 ENCOUNTER — Encounter (HOSPITAL_BASED_OUTPATIENT_CLINIC_OR_DEPARTMENT_OTHER): Payer: Self-pay | Admitting: Pulmonary Disease

## 2022-11-28 ENCOUNTER — Ambulatory Visit (INDEPENDENT_AMBULATORY_CARE_PROVIDER_SITE_OTHER): Payer: Medicare HMO | Admitting: Pulmonary Disease

## 2022-11-28 VITALS — BP 138/80 | HR 74 | Ht 64.0 in | Wt 132.0 lb

## 2022-11-28 DIAGNOSIS — Z23 Encounter for immunization: Secondary | ICD-10-CM

## 2022-11-28 DIAGNOSIS — J449 Chronic obstructive pulmonary disease, unspecified: Secondary | ICD-10-CM | POA: Diagnosis not present

## 2022-11-28 DIAGNOSIS — R911 Solitary pulmonary nodule: Secondary | ICD-10-CM

## 2022-11-28 MED ORDER — FLUTICASONE PROPIONATE 50 MCG/ACT NA SUSP: 1.0000 | Freq: Every day | NASAL | 2 refills | Status: DC

## 2022-11-28 NOTE — Patient Instructions (Signed)
Flonase 1 spray in each nostril nightly for one week, then as needed  Follow up in 4 months

## 2022-11-28 NOTE — Progress Notes (Signed)
[facility]  [facility]  Chief Complaint  Patient presents with   COPD    Constitutional:  BP 138/80   Pulse 74   Ht 5\' 4"  (1.626 m)   Wt 132 lb (59.9 kg)   SpO2 98%   BMI 22.66 kg/m   Past Medical History:  HTN, Sycope, GERD, A fib, Anxiety, Breast cancer, Depression, HTN, TIA, HLD  Past Surgical History:  Her  has a past surgical history that includes Cardiac catheterization; Total knee arthroplasty; Total hip arthroplasty; Rotator cuff repair; Mastectomy; ORIF ankle fracture (Left, 06/01/2014); Breast capsulectomy with implant exchange (Right, 05/27/2016); Esophagogastroduodenoscopy (06/2013); Colonoscopy (08/2014); Back surgery; Abdominal hysterectomy; Cholecystectomy; Appendectomy; Breast biopsy (Left); TEE without cardioversion (N/A, 05/17/2020); and Cardioversion (N/A, 05/17/2020).  Brief Summary:  Amanda Palmer is a 89 y.o. female with COPD and nocturnal hypoxemia.      Subjective:   She is here with her sister.  She has sinus congestion and drainage.  This causes throat irritation and cough with clear phlegm, especially in the morning.  Gets winded if she walks too fast.    Physical Exam:   Appearance - well kempt, kyphotic, using a cane  ENMT - no sinus tenderness, no oral exudate, no LAN, Mallampati 3 airway, no stridor  Respiratory - decreased breath sounds bilaterally, no wheezing or rales  CV - s1s2 regular rate and rhythm, no murmurs  Ext - no clubbing, no edema  Skin - no rashes  Psych - normal mood and affect     Pulmonary testing:  PFT 11/09/13 >> FEV1 1.10 (54%), FEV1% 68, TLC 5.17 (102%) PFT 11/30/14 >> FEV1 0.89 (44%), FEV1% 64, TLC 4.42 (87%), RV 3.10 (130%), DLCO 22% RAST 02/23/15 >> negative, IgE 4 A1AT 07/06/19 >> 137, MM  Chest Imaging:  HRCT chest 05/07/19 >> atherosclerosis, scarring at lung bases, numerous small nodules clustered in RML and LLL CT chest 03/24/22 >> GGO along Rt apex, 1.2 x 0.9 cm  spiculated subpleural nodule RLL, other nodules stable  Sleep Tests:  PSG 11/21/15 >> AHI 0.8, SpO2 low 90%  Cardiac Tests:  Echo 03/25/22 >> EF 60 to 65%, grade 2 DD, mod LA dilation, mild/mod MR  Social History:  She  reports that she has never smoked. She has never been exposed to tobacco smoke. She has never used smokeless tobacco. She reports that she does not drink alcohol and does not use drugs.  Family History:  Her family history includes Breast cancer in her sister, sister and another family member; Heart disease in her brother and sister; Leukemia in her brother.     Assessment/Plan:   COPD mixed type. - continue trelegy 100 one puff daily - prn xopenex - she has a nebulizer and flutter valve  Nocturnal hypoxemia 2nd to COPD. - continue 2 liters oxygen at night - goal SpO2 > 90%; she can use oxygen prn during the day  Upper airway cough. - will have her try flonase nightly  Lung nodules. - follow up CT chest in October 2024  Persistent atrial fibrillation, chronic diastolic CHF, mitral regurgitation, HLD. - followed by Dr. Wyline Mood with [facility]  Time Spent Involved in Patient Care on Day of Examination:  27 minutes  Follow up:   Patient Instructions  Flonase 1 spray in each nostril nightly for one week, then as needed  Follow up in 4 months  Medication List:   Allergies as of 11/28/2022       Reactions  Morphine    Other Reaction(s): GI Intolerance   Sulfamethoxazole    Other Reaction(s): GI Intolerance   Morphine And Codeine Nausea And Vomiting   Penicillins Rash   Reaction: unknown   Prednisone Anxiety, Nausea And Vomiting, Other (See Comments)   Makes patient not in right state of mind Other Reaction(s): GI Intolerance   Sulfa Antibiotics Nausea And Vomiting        Medication List        Accurate as of November 28, 2022  1:58 PM. If you have any questions, ask your nurse or doctor.          acetaminophen 500 MG  tablet Commonly known as: TYLENOL Take 500 mg by mouth every 6 (six) hours as needed for moderate pain.   albuterol 108 (90 Base) MCG/ACT inhaler Commonly known as: VENTOLIN HFA Inhale 2 puffs into the lungs every 6 (six) hours as needed for wheezing or shortness of breath.   albuterol (2.5 MG/3ML) 0.083% nebulizer solution Commonly known as: PROVENTIL Take 2.5 mg by nebulization in the morning and at bedtime.   amiodarone 200 MG tablet Commonly known as: PACERONE TAKE ONE TABLET BY MOUTH ONCE DAILY   apixaban 2.5 MG Tabs tablet Commonly known as: Eliquis Take 1 tablet (2.5 mg total) by mouth 2 (two) times daily.   atorvastatin 20 MG tablet Commonly known as: LIPITOR Take 20 mg by mouth at bedtime.   clonazePAM 0.5 MG tablet Commonly known as: KLONOPIN Take 0.5 mg by mouth 2 (two) times daily as needed. What changed: Another medication with the same name was removed. Continue taking this medication, and follow the directions you see here. Changed by: Coralyn Helling   esomeprazole 20 MG capsule Commonly known as: NEXIUM Take 20 mg by mouth daily.   famotidine 20 MG tablet Commonly known as: PEPCID Take 20 mg by mouth at bedtime.   fluticasone 50 MCG/ACT nasal spray Commonly known as: FLONASE Place 1 spray into both nostrils daily. Started by: Coralyn Helling   Flutter Devi 1 Device by Does not apply route in the morning, at noon, in the evening, and at bedtime.   furosemide 20 MG tablet Commonly known as: LASIX Take 1 tablet (20 mg total) by mouth as needed for edema (swelling). What changed: when to take this   guaifenesin 100 MG/5ML syrup Commonly known as: ROBITUSSIN Take 100 mg by mouth 3 (three) times daily as needed for cough.   hydrocortisone 2.5 % rectal cream Commonly known as: ANUSOL-HC Place 1 Application rectally 2 (two) times daily.   losartan 25 MG tablet Commonly known as: COZAAR Take 1 tablet (25 mg total) by mouth daily.   Magnesium Oxide 400 MG  Caps Take 1 capsule (400 mg total) by mouth daily.   metoprolol succinate 50 MG 24 hr tablet Commonly known as: Toprol XL Take 1 tablet (50 mg total) by mouth daily. Take with or immediately following a meal.   multivitamin with minerals Tabs tablet Take 1 tablet by mouth daily.   OXYGEN Inhale 2 L into the lungs continuous.   potassium chloride 10 MEQ tablet Commonly known as: KLOR-CON TAKE TWO TABLETS BY MOUTH ONCE DAILY   Trelegy Ellipta 100-62.5-25 MCG/ACT Aepb Generic drug: Fluticasone-Umeclidin-Vilant INHALE ONE (1) PUFF BY MOUTH ONCE DAILY *REFILL REQUEST*   triamcinolone cream 0.5 % Commonly known as: KENALOG Apply 1 Application topically in the morning and at bedtime.   venlafaxine 75 MG tablet Commonly known as: EFFEXOR Take 75-150 mg by mouth 2 (  two) times daily with a meal. Takes 2 tablets (150 mg) by mouth in the morning and take 1 tablet (75 mg) by mouth at night        Signature:  Coralyn Helling, MD [facility]monary/Critical Care Pager - [phone] 11/28/2022, 1:58 PM

## 2022-11-28 NOTE — Addendum Note (Signed)
Addended by: Judd Gaudier on: 11/28/2022 02:21 PM   Modules accepted: Orders

## 2022-12-02 NOTE — Progress Notes (Signed)
Care Coordination Note  12/02/2022 Name: Amanda Palmer MRN: [id number] DOB: 1933-09-02  Amanda Palmer is a 89 y.o. year old female who is a primary care patient of Benita Stabile, MD and is actively engaged with the care management team. I reached out to Alphonsus Sias by phone today to assist with re-scheduling a follow up visit with the RN Case Manager  Follow up plan: Telephone appointment with care management team member scheduled for: [facility] 12/13/22  [facility] Coordination Care Guide  Direct Dial: [phone]

## 2022-12-08 DIAGNOSIS — J449 Chronic obstructive pulmonary disease, unspecified: Secondary | ICD-10-CM | POA: Diagnosis not present

## 2022-12-13 ENCOUNTER — Ambulatory Visit: Payer: Self-pay | Admitting: *Deleted

## 2022-12-13 NOTE — Patient Outreach (Signed)
Care Coordination   Follow Up Visit Note   12/13/2022 Name: Amanda Palmer MRN: [id number] DOB: 17-Dec-1933  Amanda Palmer is a 89 y.o. year old female who sees Margo Aye, Kathleene Hazel, MD for primary care. I spoke with  Alphonsus Sias by phone today.  What matters to the patients health and wellness today?  Doing well Remains hard of hearing but has hearing aids plus she goes to have her ears cleaned  No longer has a cat She reports she is following her MD orders This is to present scratches and infections.  She now has her stuffed cat.  Chronic obstructive pulmonary disease (COPD) She reports she has always had aspiration concerns since she was a child and shortness of breath No audible congestion or coughing heard  Pcp to be seen at the end of October. Has support of her niece  Goals Addressed             This Visit's Progress    [facility] nurse care coordination services ( hearing, medications)   On track    Interventions Today    Flowsheet Row Most Recent Value  Chronic Disease   Chronic disease during today's visit Chronic Obstructive Pulmonary Disease (COPD), Other  General Interventions   General Interventions Discussed/Reviewed General Interventions Reviewed, Doctor Visits  Doctor Visits Discussed/Reviewed Doctor Visits Reviewed, PCP, Specialist  [pcp & ENT]  Exercise Interventions   Exercise Discussed/Reviewed Exercise Reviewed, Physical Activity  Physical Activity Discussed/Reviewed Physical Activity Reviewed, Home Exercise Program (HEP)  Education Interventions   Education Provided Provided Education  [infections, hearing aids]  Provided Verbal Education On Medication, Community Resources  Mental Health Interventions   Mental Health Discussed/Reviewed Mental Health Discussed, Coping Strategies  Pharmacy Interventions   Pharmacy Dicussed/Reviewed Pharmacy Topics Reviewed, Affording Medications  Safety Interventions   Safety Discussed/Reviewed Safety Reviewed, Home Safety   Home Safety --  [safe for cat scratches]              SDOH assessments and interventions completed:  No     Care Coordination Interventions:  Yes, provided   Follow up plan: Follow up call scheduled for 02/12/23    Encounter Outcome:  Patient Visit Completed   Cala Bradford L. Noelle Penner, RN, BSN, [facility]  [facility] Care Management Coordinator  [phone]  Fax: [phone]

## 2022-12-13 NOTE — Patient Instructions (Addendum)
Visit Information  Thank you for taking time to visit with me today. Please don't hesitate to contact me if I can be of assistance to you.   Following are the goals we discussed today:   Goals Addressed             This Visit's Progress    [facility] nurse care coordination services ( hearing, medications)   On track    Interventions Today    Flowsheet Row Most Recent Value  Chronic Disease   Chronic disease during today's visit Chronic Obstructive Pulmonary Disease (COPD), Other  General Interventions   General Interventions Discussed/Reviewed General Interventions Reviewed, Doctor Visits  Doctor Visits Discussed/Reviewed Doctor Visits Reviewed, PCP, Specialist  [pcp & ENT]  Exercise Interventions   Exercise Discussed/Reviewed Exercise Reviewed, Physical Activity  Physical Activity Discussed/Reviewed Physical Activity Reviewed, Home Exercise Program (HEP)  Education Interventions   Education Provided Provided Education  [infections, hearing aids]  Provided Verbal Education On Medication, Community Resources  Mental Health Interventions   Mental Health Discussed/Reviewed Mental Health Discussed, Coping Strategies  Pharmacy Interventions   Pharmacy Dicussed/Reviewed Pharmacy Topics Reviewed, Affording Medications  Safety Interventions   Safety Discussed/Reviewed Safety Reviewed, Home Safety  Home Safety --  [safe for cat scratches]              Our next appointment is by telephone on 112/4/24 at 2:15 pm  Please call the care guide team at [phone] if you need to cancel or reschedule your appointment.   If you are experiencing a Mental Health or Behavioral Health Crisis or need someone to talk to, please call the Suicide and Crisis Lifeline: 988 call the [place] National Suicide Prevention Lifeline: 310 836 2204 or TTY: 479-617-0710 TTY (249)697-6522) to talk to a trained counselor call 1-800-273-TALK (toll free, 24 hour hotline) call the [facility]:  [phone] call 911   Patient verbalizes understanding of instructions and care plan provided today and agrees to view in MyChart. Active MyChart status and patient understanding of how to access instructions and care plan via MyChart confirmed with patient.     The patient has been provided with contact information for the care management team and has been advised to call with any health related questions or concerns.   Versa Craton L. Noelle Penner, RN, BSN, [facility]  VBCI Care Management Coordinator  [phone]  Fax: [phone]

## 2022-12-19 ENCOUNTER — Ambulatory Visit (HOSPITAL_COMMUNITY)
Admission: RE | Admit: 2022-12-19 | Discharge: 2022-12-19 | Disposition: A | Discharge status: Home / Self Care | Payer: Medicare HMO | Source: Ambulatory Visit | Attending: Pulmonary Disease | Admitting: Pulmonary Disease

## 2022-12-19 DIAGNOSIS — R911 Solitary pulmonary nodule: Secondary | ICD-10-CM | POA: Diagnosis not present

## 2022-12-19 DIAGNOSIS — R918 Other nonspecific abnormal finding of lung field: Secondary | ICD-10-CM | POA: Diagnosis not present

## 2022-12-19 DIAGNOSIS — I700 Atherosclerosis of aorta: Secondary | ICD-10-CM | POA: Diagnosis not present

## 2022-12-19 DIAGNOSIS — J90 Pleural effusion, not elsewhere classified: Secondary | ICD-10-CM | POA: Diagnosis not present

## 2022-12-20 DIAGNOSIS — J449 Chronic obstructive pulmonary disease, unspecified: Secondary | ICD-10-CM | POA: Diagnosis not present

## 2022-12-31 DIAGNOSIS — M79671 Pain in right foot: Secondary | ICD-10-CM | POA: Diagnosis not present

## 2022-12-31 DIAGNOSIS — M79672 Pain in left foot: Secondary | ICD-10-CM | POA: Diagnosis not present

## 2022-12-31 DIAGNOSIS — L110 Acquired keratosis follicularis: Secondary | ICD-10-CM | POA: Diagnosis not present

## 2022-12-31 DIAGNOSIS — I739 Peripheral vascular disease, unspecified: Secondary | ICD-10-CM | POA: Diagnosis not present

## 2023-01-02 DIAGNOSIS — I10 Essential (primary) hypertension: Secondary | ICD-10-CM | POA: Diagnosis not present

## 2023-01-02 DIAGNOSIS — E559 Vitamin D deficiency, unspecified: Secondary | ICD-10-CM | POA: Diagnosis not present

## 2023-01-02 DIAGNOSIS — R7301 Impaired fasting glucose: Secondary | ICD-10-CM | POA: Diagnosis not present

## 2023-01-07 DIAGNOSIS — F419 Anxiety disorder, unspecified: Secondary | ICD-10-CM | POA: Diagnosis not present

## 2023-01-07 DIAGNOSIS — R419 Unspecified symptoms and signs involving cognitive functions and awareness: Secondary | ICD-10-CM | POA: Diagnosis not present

## 2023-01-07 DIAGNOSIS — S0990XD Unspecified injury of head, subsequent encounter: Secondary | ICD-10-CM | POA: Diagnosis not present

## 2023-01-07 DIAGNOSIS — R911 Solitary pulmonary nodule: Secondary | ICD-10-CM | POA: Diagnosis not present

## 2023-01-07 DIAGNOSIS — J449 Chronic obstructive pulmonary disease, unspecified: Secondary | ICD-10-CM | POA: Diagnosis not present

## 2023-01-07 DIAGNOSIS — I482 Chronic atrial fibrillation, unspecified: Secondary | ICD-10-CM | POA: Diagnosis not present

## 2023-01-07 DIAGNOSIS — R296 Repeated falls: Secondary | ICD-10-CM | POA: Diagnosis not present

## 2023-01-07 DIAGNOSIS — K219 Gastro-esophageal reflux disease without esophagitis: Secondary | ICD-10-CM | POA: Diagnosis not present

## 2023-01-07 DIAGNOSIS — I10 Essential (primary) hypertension: Secondary | ICD-10-CM | POA: Diagnosis not present

## 2023-01-07 DIAGNOSIS — D649 Anemia, unspecified: Secondary | ICD-10-CM | POA: Diagnosis not present

## 2023-01-07 DIAGNOSIS — R55 Syncope and collapse: Secondary | ICD-10-CM | POA: Diagnosis not present

## 2023-01-07 DIAGNOSIS — I739 Peripheral vascular disease, unspecified: Secondary | ICD-10-CM | POA: Diagnosis not present

## 2023-01-08 ENCOUNTER — Other Ambulatory Visit (INDEPENDENT_AMBULATORY_CARE_PROVIDER_SITE_OTHER): Payer: Medicare HMO

## 2023-01-08 ENCOUNTER — Ambulatory Visit: Payer: Medicare HMO | Admitting: Orthopedic Surgery

## 2023-01-08 ENCOUNTER — Encounter: Payer: Self-pay | Admitting: Orthopedic Surgery

## 2023-01-08 VITALS — BP 162/86 | HR 79 | Ht 64.0 in

## 2023-01-08 DIAGNOSIS — M25532 Pain in left wrist: Secondary | ICD-10-CM | POA: Diagnosis not present

## 2023-01-08 DIAGNOSIS — S52515A Nondisplaced fracture of left radial styloid process, initial encounter for closed fracture: Secondary | ICD-10-CM

## 2023-01-08 NOTE — Progress Notes (Signed)
Orthopaedic Clinic Return  Assessment: Amanda Palmer is a 89 y.o. female with the following: Left nondisplaced radial styloid fracture  Plan: Amanda Palmer sustained a minimally displaced fracture of the left radial styloid.  Amenable to nonoperative management.  She was fitted with a thumb spica brace.  Keep the brace on at all times except for hygiene.  Okay to remove the brace for short breaks if seated, doing nothing.  Elevate the arm to help with swelling.  Follow-up in 2-3 weeks for repeat x-ray.  Follow-up: Return for 2-3 weeks.   Subjective:  Chief Complaint  Patient presents with   Wrist Pain    L wrist pain after a fall DOI 01/06/23    History of Present Illness: Amanda Palmer is a 89 y.o. female who returns to clinic for evaluation of left wrist pain.  A few days ago, she lost her balance and fell.  She had pain in the left wrist.  She had immediate swelling.  She was evaluated by Dr. Margo Aye yesterday, and her wrist was wrapped.  This improved the swelling.  She continues to have pain.  No x-rays as of yet.  She is right-hand dominant.  She uses a cane in the right hand.  She has assistance at home.  Review of Systems: No fevers or chills No numbness or tingling No chest pain No shortness of breath No bowel or bladder dysfunction No GI distress No headaches   Objective: BP (!) 162/86   Pulse 79   Ht 5\' 4"  (1.626 m)   BMI 22.66 kg/m   Physical Exam:  Elderly female.  No acute distress.    Use a cane to assist with ambulation.  Evaluation of the left wrist demonstrates diffuse ecchymosis.  Minimal swelling.  Tenderness to palpation directly over the radial styloid.  She tolerates gentle range of motion of the left wrist.  Fingers are warm and well-perfused.  Sensation intact throughout the left hand.  IMAGING: I personally ordered and reviewed the following images:   X-rays of the left wrist were obtained in clinic today.  There is a minimally displaced  fracture of the radial styloid.  Intra-articular involvement, without step-off.  No additional injuries are noted.  No dislocation.  Arthritis at the first [facility] joint.  Impression: Minimally placed left radial styloid fracture.  Oliver Barre, MD 01/08/2023 2:51 PM

## 2023-01-08 NOTE — Patient Instructions (Signed)
Brace at all times on the left wrist  Ok to remove for hygiene  If sitting, doing nothing, ok to remove brace for a short period of time  Follow up in 2-3 weeks

## 2023-01-17 ENCOUNTER — Other Ambulatory Visit (HOSPITAL_COMMUNITY): Payer: Self-pay | Admitting: Internal Medicine

## 2023-01-17 DIAGNOSIS — Z1231 Encounter for screening mammogram for malignant neoplasm of breast: Secondary | ICD-10-CM

## 2023-01-20 DIAGNOSIS — J449 Chronic obstructive pulmonary disease, unspecified: Secondary | ICD-10-CM | POA: Diagnosis not present

## 2023-01-26 DIAGNOSIS — J449 Chronic obstructive pulmonary disease, unspecified: Secondary | ICD-10-CM | POA: Diagnosis not present

## 2023-01-26 DIAGNOSIS — W01198A Fall on same level from slipping, tripping and stumbling with subsequent striking against other object, initial encounter: Secondary | ICD-10-CM | POA: Diagnosis not present

## 2023-01-26 DIAGNOSIS — Z7902 Long term (current) use of antithrombotics/antiplatelets: Secondary | ICD-10-CM | POA: Diagnosis not present

## 2023-01-26 DIAGNOSIS — Z79899 Other long term (current) drug therapy: Secondary | ICD-10-CM | POA: Diagnosis not present

## 2023-01-26 DIAGNOSIS — F32A Depression, unspecified: Secondary | ICD-10-CM | POA: Diagnosis not present

## 2023-01-26 DIAGNOSIS — Z880 Allergy status to penicillin: Secondary | ICD-10-CM | POA: Diagnosis not present

## 2023-01-26 DIAGNOSIS — W010XXA Fall on same level from slipping, tripping and stumbling without subsequent striking against object, initial encounter: Secondary | ICD-10-CM | POA: Diagnosis not present

## 2023-01-26 DIAGNOSIS — Z882 Allergy status to sulfonamides status: Secondary | ICD-10-CM | POA: Diagnosis not present

## 2023-01-26 DIAGNOSIS — E785 Hyperlipidemia, unspecified: Secondary | ICD-10-CM | POA: Diagnosis not present

## 2023-01-26 DIAGNOSIS — I10 Essential (primary) hypertension: Secondary | ICD-10-CM | POA: Diagnosis not present

## 2023-01-26 DIAGNOSIS — R519 Headache, unspecified: Secondary | ICD-10-CM | POA: Diagnosis not present

## 2023-01-26 DIAGNOSIS — S0083XA Contusion of other part of head, initial encounter: Secondary | ICD-10-CM | POA: Diagnosis not present

## 2023-01-26 DIAGNOSIS — S0181XA Laceration without foreign body of other part of head, initial encounter: Secondary | ICD-10-CM | POA: Diagnosis not present

## 2023-01-26 DIAGNOSIS — Z7901 Long term (current) use of anticoagulants: Secondary | ICD-10-CM | POA: Diagnosis not present

## 2023-01-26 DIAGNOSIS — S0990XA Unspecified injury of head, initial encounter: Secondary | ICD-10-CM | POA: Diagnosis not present

## 2023-01-26 DIAGNOSIS — S62639A Displaced fracture of distal phalanx of unspecified finger, initial encounter for closed fracture: Secondary | ICD-10-CM | POA: Diagnosis not present

## 2023-01-26 DIAGNOSIS — Z885 Allergy status to narcotic agent status: Secondary | ICD-10-CM | POA: Diagnosis not present

## 2023-01-28 ENCOUNTER — Other Ambulatory Visit (INDEPENDENT_AMBULATORY_CARE_PROVIDER_SITE_OTHER): Payer: Self-pay

## 2023-01-28 ENCOUNTER — Ambulatory Visit (INDEPENDENT_AMBULATORY_CARE_PROVIDER_SITE_OTHER): Payer: Medicare HMO | Admitting: Orthopedic Surgery

## 2023-01-28 ENCOUNTER — Other Ambulatory Visit (INDEPENDENT_AMBULATORY_CARE_PROVIDER_SITE_OTHER): Payer: Medicare HMO

## 2023-01-28 DIAGNOSIS — M12812 Other specific arthropathies, not elsewhere classified, left shoulder: Secondary | ICD-10-CM

## 2023-01-28 DIAGNOSIS — G8929 Other chronic pain: Secondary | ICD-10-CM

## 2023-01-28 DIAGNOSIS — M75102 Unspecified rotator cuff tear or rupture of left shoulder, not specified as traumatic: Secondary | ICD-10-CM

## 2023-01-28 DIAGNOSIS — M20011 Mallet finger of right finger(s): Secondary | ICD-10-CM | POA: Diagnosis not present

## 2023-01-28 DIAGNOSIS — M20019 Mallet finger of unspecified finger(s): Secondary | ICD-10-CM

## 2023-01-28 DIAGNOSIS — S62636A Displaced fracture of distal phalanx of right little finger, initial encounter for closed fracture: Secondary | ICD-10-CM

## 2023-01-28 DIAGNOSIS — S52515A Nondisplaced fracture of left radial styloid process, initial encounter for closed fracture: Secondary | ICD-10-CM

## 2023-01-28 DIAGNOSIS — S52515D Nondisplaced fracture of left radial styloid process, subsequent encounter for closed fracture with routine healing: Secondary | ICD-10-CM

## 2023-01-29 ENCOUNTER — Inpatient Hospital Stay (HOSPITAL_COMMUNITY): Admission: RE | Admit: 2023-01-29 | Payer: Medicare HMO | Source: Ambulatory Visit

## 2023-01-29 ENCOUNTER — Ambulatory Visit: Payer: Medicare HMO | Attending: Cardiology | Admitting: Cardiology

## 2023-01-29 ENCOUNTER — Encounter: Payer: Self-pay | Admitting: Cardiology

## 2023-01-29 ENCOUNTER — Encounter: Payer: Self-pay | Admitting: Orthopedic Surgery

## 2023-01-29 VITALS — BP 170/75 | HR 66 | Ht 60.0 in | Wt 131.6 lb

## 2023-01-29 DIAGNOSIS — E782 Mixed hyperlipidemia: Secondary | ICD-10-CM | POA: Diagnosis not present

## 2023-01-29 DIAGNOSIS — I5032 Chronic diastolic (congestive) heart failure: Secondary | ICD-10-CM | POA: Diagnosis not present

## 2023-01-29 DIAGNOSIS — I10 Essential (primary) hypertension: Secondary | ICD-10-CM | POA: Diagnosis not present

## 2023-01-29 DIAGNOSIS — I480 Paroxysmal atrial fibrillation: Secondary | ICD-10-CM | POA: Diagnosis not present

## 2023-01-29 MED ORDER — LOSARTAN POTASSIUM 25 MG PO TABS: 37.5000 mg | ORAL_TABLET | Freq: Every day | ORAL | 1 refills | Status: DC

## 2023-01-29 NOTE — Progress Notes (Signed)
Clinical Summary Amanda Palmer is a 89 y.o.female seen today for follow up of the following medical problems.      1. Afib/acquired thrombophilia - had been on dofetilide, change to amio due to long QT.From EP note not good ablation candidate.        - no palpitatins - no bleeding on eliquis. Wt right at 25kg, age 68, Cr 0.9    05/2022 monitor Sinus rhythm rate 51-94, avg 63 There was one 17 beat run of SVT at 118 bpm - no recent palpitations - compliant with meds    2. Chronic systolic HF - 12/2019 TTE: LVEF 60-65% - 05/2020 TEE: LVEF 30-35%. This was around the time she had issues with afib with RVR - medical therapy limited by low bp's       09/2020 echo LVEF 60-65% - Jan 2024 echo: LVEF 60-65%, grade II dd - chronic SOB/DOE unchanged       3. HTN - she is compliant with meds - orthsostatic dizziness, we had lowered her losartan from 50mg  to 25mg  daily.      4. Hyperlpidemia - on statin  09/2021 TC 132 TG 109 HDL 61 LDL 51 02/2022 TC 164 LDL 67 TG 80 HDL 80 - 08/2022 TC 147 TG 98 HDL 67 LDL 62   5. SOB/Chronic lung disease - on home O2, followed by pulmonary     6. Vocal cord dysfunction     6. Valvular heart disease - 05/2020 TEE mod to severe, mod MR, LVEF 30-35% 09/2020 trivial MR, normalized EF   - apepars to have been funcitonal MR that has resolved.    7. Mechanical fall - recent mechanical fall walking on cracked pavement - shortly after had a fall after bending over several times picking up tomoatos, had some leg weakness prior. May have been orthostatic. Prior to that her last fall was 4 months ago       Sister of Amanda Palmer, former patient of mine who passed way in 2020 Past Medical History:  Diagnosis Date   A-fib [facility])    Anxiety    Arthritis    Cancer (HCC)    breast   COPD (chronic obstructive pulmonary disease) (HCC)    Depression    GERD (gastroesophageal reflux disease)    Hypertension    TIA (transient ischemic attack)     remote     Allergies  Allergen Reactions   Morphine     Other Reaction(s): GI Intolerance   Sulfamethoxazole     Other Reaction(s): GI Intolerance   Morphine And Codeine Nausea And Vomiting   Penicillins Rash    Reaction: unknown   Prednisone Anxiety, Nausea And Vomiting and Other (See Comments)    Makes patient not in right state of mind  Other Reaction(s): GI Intolerance   Sulfa Antibiotics Nausea And Vomiting     Current Outpatient Medications  Medication Sig Dispense Refill   acetaminophen (TYLENOL) 500 MG tablet Take 500 mg by mouth every 6 (six) hours as needed for moderate pain.     albuterol (PROVENTIL HFA;VENTOLIN HFA) 108 (90 Base) MCG/ACT inhaler Inhale 2 puffs into the lungs every 6 (six) hours as needed for wheezing or shortness of breath.     albuterol (PROVENTIL) (2.5 MG/3ML) 0.083% nebulizer solution Take 2.5 mg by nebulization in the morning and at bedtime.     amiodarone (PACERONE) 200 MG tablet TAKE ONE TABLET BY MOUTH ONCE DAILY 30 tablet 5   apixaban (ELIQUIS)  2.5 MG TABS tablet Take 1 tablet (2.5 mg total) by mouth 2 (two) times daily. 60 tablet 6   atorvastatin (LIPITOR) 20 MG tablet Take 20 mg by mouth at bedtime.     clonazePAM (KLONOPIN) 0.5 MG tablet Take 0.5 mg by mouth 2 (two) times daily as needed.     esomeprazole (NEXIUM) 20 MG capsule Take 20 mg by mouth daily.     famotidine (PEPCID) 20 MG tablet Take 20 mg by mouth at bedtime.     fluticasone (FLONASE) 50 MCG/ACT nasal spray Place 1 spray into both nostrils daily. 16 g 2   furosemide (LASIX) 20 MG tablet Take 1 tablet (20 mg total) by mouth as needed for edema (swelling). (Patient taking differently: Take 20 mg by mouth daily as needed for edema (swelling).)     guaifenesin (ROBITUSSIN) 100 MG/5ML syrup Take 100 mg by mouth 3 (three) times daily as needed for cough.     hydrocortisone (ANUSOL-HC) 2.5 % rectal cream Place 1 Application rectally 2 (two) times daily. 30 g 0   losartan (COZAAR) 25  MG tablet Take 1 tablet (25 mg total) by mouth daily. 30 tablet 6   Magnesium Oxide 400 MG CAPS Take 1 capsule (400 mg total) by mouth daily. 90 capsule 3   metoprolol succinate (TOPROL XL) 50 MG 24 hr tablet Take 1 tablet (50 mg total) by mouth daily. Take with or immediately following a meal. 30 tablet 3   Multiple Vitamin (MULTIVITAMIN WITH MINERALS) TABS tablet Take 1 tablet by mouth daily.     OXYGEN Inhale 2 L into the lungs continuous.     potassium chloride (KLOR-CON) 10 MEQ tablet TAKE TWO TABLETS BY MOUTH ONCE DAILY 180 tablet 2   Respiratory Therapy Supplies (FLUTTER) DEVI 1 Device by Does not apply route in the morning, at noon, in the evening, and at bedtime. 1 each 0   TRELEGY ELLIPTA 100-62.5-25 MCG/ACT AEPB INHALE ONE (1) PUFF BY MOUTH ONCE DAILY *REFILL REQUEST* 60 each 10   triamcinolone cream (KENALOG) 0.5 % Apply 1 Application topically in the morning and at bedtime.     venlafaxine (EFFEXOR) 75 MG tablet Take 75-150 mg by mouth 2 (two) times daily with a meal. Takes 2 tablets (150 mg) by mouth in the morning and take 1 tablet (75 mg) by mouth at night     No current facility-administered medications for this visit.     Past Surgical History:  Procedure Laterality Date   ABDOMINAL HYSTERECTOMY     APPENDECTOMY     BACK SURGERY     total of five surgeries   BREAST BIOPSY Left    benign   BREAST CAPSULECTOMY WITH IMPLANT EXCHANGE Right 05/27/2016   Procedure: REMOVAL AND REPLACEMENT OF RIGHT BREAST IMPLANT AND BREAST CAPSULECTOMY;  Surgeon: Louisa Second, MD;  Location: [facility];  Service: Plastics;  Laterality: Right;   CARDIAC CATHETERIZATION     CARDIOVERSION N/A 05/17/2020   Procedure: CARDIOVERSION;  Surgeon: Elease Hashimoto Deloris Ping, MD;  Location: [facility] ENDOSCOPY;  Service: Cardiovascular;  Laterality: N/A;   CHOLECYSTECTOMY     COLONOSCOPY  08/2014   Dr. Teena Dunk: Small sessile polyp at the cecum, removed, tubular adenoma   ESOPHAGOGASTRODUODENOSCOPY   06/2013   Dr. Teena Dunk: Hiatal hernia, Schatzki ring, nonobstructive.   MASTECTOMY     right    ORIF ANKLE FRACTURE Left 06/01/2014   Procedure: OPEN REDUCTION INTERNAL FIXATION (ORIF) LEFT ANKLE FRACTURE;  Surgeon: Eldred Manges, MD;  Location:  [facility] OR;  Service: Orthopedics;  Laterality: Left;   ROTATOR CUFF REPAIR     left   TEE WITHOUT CARDIOVERSION N/A 05/17/2020   Procedure: TRANSESOPHAGEAL ECHOCARDIOGRAM (TEE);  Surgeon: Vesta Mixer, MD;  Location: [facility] ENDOSCOPY;  Service: Cardiovascular;  Laterality: N/A;   TOTAL HIP ARTHROPLASTY     right   TOTAL KNEE ARTHROPLASTY     bilateral     Allergies  Allergen Reactions   Morphine     Other Reaction(s): GI Intolerance   Sulfamethoxazole     Other Reaction(s): GI Intolerance   Morphine And Codeine Nausea And Vomiting   Penicillins Rash    Reaction: unknown   Prednisone Anxiety, Nausea And Vomiting and Other (See Comments)    Makes patient not in right state of mind  Other Reaction(s): GI Intolerance   Sulfa Antibiotics Nausea And Vomiting      Family History  Problem Relation Age of Onset   Heart disease Brother    Heart disease Sister    Breast cancer Sister    Leukemia Brother    Breast cancer Sister    Breast cancer Other        one deceased, one living   Colon cancer Neg Hx      Social History Ms. Roederer reports that she has never smoked. She has never been exposed to tobacco smoke. She has never used smokeless tobacco. Ms. Brasier reports no history of alcohol use.   Review of Systems CONSTITUTIONAL: No weight loss, fever, chills, weakness or fatigue.  HEENT: Eyes: No visual loss, blurred vision, double vision or yellow sclerae.No hearing loss, sneezing, congestion, runny nose or sore throat.  SKIN: No rash or itching.  CARDIOVASCULAR: per hpi RESPIRATORY: per hpi.  GASTROINTESTINAL: No anorexia, nausea, vomiting or diarrhea. No abdominal pain or blood.  GENITOURINARY: No burning on urination, no  polyuria NEUROLOGICAL: No headache, dizziness, syncope, paralysis, ataxia, numbness or tingling in the extremities. No change in bowel or bladder control.  MUSCULOSKELETAL: No muscle, back pain, joint pain or stiffness.  LYMPHATICS: No enlarged nodes. No history of splenectomy.  PSYCHIATRIC: No history of depression or anxiety.  ENDOCRINOLOGIC: No reports of sweating, cold or heat intolerance. No polyuria or polydipsia.  Marland Kitchen   Physical Examination Today's Vitals   01/29/23 1316 01/29/23 1404  BP: (!) 182/64 (!) 170/75  Pulse: 66   SpO2: 98%   Weight: 131 lb 9.6 oz (59.7 kg)   Height: 5' (1.524 m)    Body mass index is 25.7 kg/m.  Gen: resting comfortably, no acute distress HEENT: no scleral icterus, pupils equal round and reactive, no palptable cervical adenopathy,  CV: RRR, no m/r,g no jvd Resp: Clear to auscultation bilaterally GI: abdomen is soft, non-tender, non-distended, normal bowel sounds, no hepatosplenomegaly MSK: extremities are warm, no edema.  Skin: warm, no rash Neuro:  no focal deficits Psych: appropriate affect   Diagnostic Studies     Assessment and Plan  Afib/acquired thrombophilia - no symptoms, continue current meds - intermittent falls as listed above. For now fairly infrequent would continue anticoag, if progress may need to weigh risk vs benefit.    2. HFimpEF -prior drop in LVEF likely tachymediated as it was in the setting of uncontrolled afib - LVEF has normalized -denies significant symptoms.    3. Falls - mechanical, does at times have some orthostatic symptoms prior to falls - discussed again increased oral hydration.    4. HTN - bp elevated, conservative management given advanced age and  orthostatic dizziness - increase losartan to 37.5mg  daily.    5. Hyperlipidemia -she is at goal, continue current meds      Antoine Poche, M.D.

## 2023-01-29 NOTE — Progress Notes (Signed)
Orthopaedic Clinic Return  Assessment: Amanda Palmer is a 89 y.o. female with the following: Left nondisplaced radial styloid fracture Left rotator cuff arthropathy Right small finger mallet fracture  Plan: Amanda Palmer presented to clinic today, for routine follow-up for a left radial styloid fracture.  Radiographs remain stable.  Her pain is improving.  Minimal swelling.  Continue with a thumb spica brace.  Since I last saw her, she has fallen.  She sustained an injury to the right small finger.  The radiographs demonstrates a small fracture off the dorsal aspect of the distal phalanx, consistent with a mallet injury.  She was placed in a stack splint.  She is to keep this on at all times.  Okay to remove for hygiene, but keep the finger extended as much as possible.  She is also complaining of left shoulder pain, and radiographs demonstrates evidence of a chronic rotator cuff injury.  There is starting to be some bone eroding the undersurface of the acromion.  I have offered her steroid injection, and she elected proceed.  This was completed in clinic today.  She will follow-up in 2 weeks for repeat evaluation.  Procedure note injection Left shoulder    Verbal consent was obtained to inject the left shoulder, subacromial space Timeout was completed to confirm the site of injection.  The skin was prepped with alcohol and ethyl chloride was sprayed at the injection site.  A 21-gauge needle was used to inject 40 mg of Depo-Medrol and 1% lidocaine (4 cc) into the subacromial space of the left shoulder using a posterolateral approach.  There were no complications. A sterile bandage was applied.  Follow-up: Return in about 2 weeks (around 02/11/2023).   Subjective:  Chief Complaint  Patient presents with   Fracture    L wrist  NDC: [phone]    Shoulder Pain    History of Present Illness: Amanda Palmer is a 89 y.o. female who returns to clinic for evaluation of left wrist pain.  I  saw her in clinic a couple weeks ago.  At that time I placed her in a thumb spica brace for a minimally displaced radial styloid fracture.  She has done well.  Her pain is improving.  However, since I saw her last, she has fallen again.  She has sustained an injury to the right small finger, and is a complaining of pain in the left shoulder.  She was evaluated at [facility].  She is currently wearing an AlumaFoam splint on her right hand.  Review of Systems: No fevers or chills No numbness or tingling No chest pain No shortness of breath No bowel or bladder dysfunction No GI distress No headaches   Objective: There were no vitals taken for this visit.  Physical Exam:  Elderly female.  No acute distress.    Use a walker to assist with ambulation.  Left wrist with minimal swelling.  No bruising.  Tenderness palpation over the radial styloid.  She tolerates gentle range of motion of the wrist.  Sensation intact throughout the left hand.  She is able to move her fingers.  Left shoulder with limited range of motion beyond the level of her shoulder.  Diffusely tender to palpation.  No deformity.  No bruising.  No swelling.  Right hand with deformity to the small finger.  Mild extensor lag.  There is some dried blood, and some small superficial lesions.  Sensation is intact distally.  IMAGING: I personally ordered and reviewed the  following images:  X-rays of the left wrist were obtained in clinic today.  These are compared to prior x-rays.  Minimal displaced fracture at the radial styloid, remains in unchanged position.  No bony lesions.  No additional injuries noted.  Impression: Stable left radial styloid fracture   X-rays of the left shoulder were obtained in clinic today.  No acute injuries are noted.  There is proximal humeral migration.  There is some acetabularization of the acromion.  There are some cysts within humeral head, as well as the glenoid.  Impression: Left shoulder  x-rays with evidence of chronic rotator cuff injury   X-rays from [facility] of the right hand were available.  Small fracture of the dorsal lip of the distal phalanx of the small finger.  Oliver Barre, MD 01/29/2023 12:47 PM

## 2023-01-29 NOTE — Patient Instructions (Addendum)
Medication Instructions:  Your physician has recommended you make the following change in your medication:  Increase Losartan to 37.5 mg daily Continue taking all other medications as prescribed  Labwork: None  Testing/Procedures: None  Follow-Up: Your physician recommends that you schedule a follow-up appointment in: 6 months  Any Other Special Instructions Will Be Listed Below (If Applicable). Thank you for choosing [facility]!      If you need a refill on your cardiac medications before your next appointment, please call your pharmacy.

## 2023-02-03 DIAGNOSIS — R296 Repeated falls: Secondary | ICD-10-CM | POA: Diagnosis not present

## 2023-02-03 DIAGNOSIS — I420 Dilated cardiomyopathy: Secondary | ICD-10-CM | POA: Diagnosis not present

## 2023-02-03 DIAGNOSIS — I482 Chronic atrial fibrillation, unspecified: Secondary | ICD-10-CM | POA: Diagnosis not present

## 2023-02-03 DIAGNOSIS — R419 Unspecified symptoms and signs involving cognitive functions and awareness: Secondary | ICD-10-CM | POA: Diagnosis not present

## 2023-02-03 DIAGNOSIS — M25812 Other specified joint disorders, left shoulder: Secondary | ICD-10-CM | POA: Diagnosis not present

## 2023-02-03 DIAGNOSIS — S52515D Nondisplaced fracture of left radial styloid process, subsequent encounter for closed fracture with routine healing: Secondary | ICD-10-CM | POA: Diagnosis not present

## 2023-02-03 DIAGNOSIS — I10 Essential (primary) hypertension: Secondary | ICD-10-CM | POA: Diagnosis not present

## 2023-02-03 DIAGNOSIS — S62630D Displaced fracture of distal phalanx of right index finger, subsequent encounter for fracture with routine healing: Secondary | ICD-10-CM | POA: Diagnosis not present

## 2023-02-03 DIAGNOSIS — K219 Gastro-esophageal reflux disease without esophagitis: Secondary | ICD-10-CM | POA: Diagnosis not present

## 2023-02-03 DIAGNOSIS — F419 Anxiety disorder, unspecified: Secondary | ICD-10-CM | POA: Diagnosis not present

## 2023-02-05 ENCOUNTER — Ambulatory Visit (HOSPITAL_COMMUNITY)
Admission: RE | Admit: 2023-02-05 | Discharge: 2023-02-05 | Disposition: A | Discharge status: Home / Self Care | Payer: Medicare HMO | Source: Ambulatory Visit | Attending: Internal Medicine | Admitting: Internal Medicine

## 2023-02-05 ENCOUNTER — Other Ambulatory Visit (HOSPITAL_COMMUNITY): Payer: Self-pay | Admitting: Internal Medicine

## 2023-02-05 DIAGNOSIS — Z1231 Encounter for screening mammogram for malignant neoplasm of breast: Secondary | ICD-10-CM | POA: Insufficient documentation

## 2023-02-07 DIAGNOSIS — M80032D Age-related osteoporosis with current pathological fracture, left forearm, subsequent encounter for fracture with routine healing: Secondary | ICD-10-CM | POA: Diagnosis not present

## 2023-02-07 DIAGNOSIS — I482 Chronic atrial fibrillation, unspecified: Secondary | ICD-10-CM | POA: Diagnosis not present

## 2023-02-07 DIAGNOSIS — Z7901 Long term (current) use of anticoagulants: Secondary | ICD-10-CM | POA: Diagnosis not present

## 2023-02-07 DIAGNOSIS — Z9181 History of falling: Secondary | ICD-10-CM | POA: Diagnosis not present

## 2023-02-07 DIAGNOSIS — D509 Iron deficiency anemia, unspecified: Secondary | ICD-10-CM | POA: Diagnosis not present

## 2023-02-07 DIAGNOSIS — F419 Anxiety disorder, unspecified: Secondary | ICD-10-CM | POA: Diagnosis not present

## 2023-02-07 DIAGNOSIS — I10 Essential (primary) hypertension: Secondary | ICD-10-CM | POA: Diagnosis not present

## 2023-02-07 DIAGNOSIS — R296 Repeated falls: Secondary | ICD-10-CM | POA: Diagnosis not present

## 2023-02-07 DIAGNOSIS — I739 Peripheral vascular disease, unspecified: Secondary | ICD-10-CM | POA: Diagnosis not present

## 2023-02-07 DIAGNOSIS — M80041D Age-related osteoporosis with current pathological fracture, right hand, subsequent encounter for fracture with routine healing: Secondary | ICD-10-CM | POA: Diagnosis not present

## 2023-02-07 DIAGNOSIS — Z9981 Dependence on supplemental oxygen: Secondary | ICD-10-CM | POA: Diagnosis not present

## 2023-02-07 DIAGNOSIS — J449 Chronic obstructive pulmonary disease, unspecified: Secondary | ICD-10-CM | POA: Diagnosis not present

## 2023-02-11 ENCOUNTER — Other Ambulatory Visit (INDEPENDENT_AMBULATORY_CARE_PROVIDER_SITE_OTHER): Payer: Self-pay

## 2023-02-11 ENCOUNTER — Ambulatory Visit (INDEPENDENT_AMBULATORY_CARE_PROVIDER_SITE_OTHER): Payer: Medicare HMO | Admitting: Orthopedic Surgery

## 2023-02-11 ENCOUNTER — Encounter: Payer: Self-pay | Admitting: Orthopedic Surgery

## 2023-02-11 ENCOUNTER — Other Ambulatory Visit (INDEPENDENT_AMBULATORY_CARE_PROVIDER_SITE_OTHER): Payer: Medicare HMO

## 2023-02-11 DIAGNOSIS — M20019 Mallet finger of unspecified finger(s): Secondary | ICD-10-CM | POA: Diagnosis not present

## 2023-02-11 DIAGNOSIS — S62636A Displaced fracture of distal phalanx of right little finger, initial encounter for closed fracture: Secondary | ICD-10-CM | POA: Diagnosis not present

## 2023-02-11 DIAGNOSIS — S62639D Displaced fracture of distal phalanx of unspecified finger, subsequent encounter for fracture with routine healing: Secondary | ICD-10-CM

## 2023-02-11 DIAGNOSIS — S52515D Nondisplaced fracture of left radial styloid process, subsequent encounter for closed fracture with routine healing: Secondary | ICD-10-CM

## 2023-02-11 NOTE — Patient Instructions (Addendum)
Continue to use the splint on the small finger at all times  Brace on the left wrist, only as needed

## 2023-02-11 NOTE — Progress Notes (Signed)
Orthopaedic Clinic Return  Assessment: Amanda Palmer is a 89 y.o. female with the following: Left nondisplaced radial styloid fracture Right small finger mallet fracture  Plan: [name] returned to clinic for repeat evaluation of the right small finger and left wrist.  She has had multiple falls recently.  Radial styloid fracture approximately 6 weeks ago.  Okay to remove the brace.  Radiographs remain stable.  Gradual increase in activity.  Gentle range of motion as tolerated.  Small finger injury was more recent.  She has a bony mallet.  She should continue to use the splint at all times, to maintain full extension at the DIP joint.  She is aware that she may have some residual extensor lag.  Follow-up in 3 weeks.   Follow-up: Return in about 3 weeks (around 03/04/2023).   Subjective:  Chief Complaint  Patient presents with   Fracture    L wrist R little finger DOI 01/06/23    History of Present Illness: Amanda Palmer is a 89 y.o. female who returns to clinic for evaluation of left wrist pain and right small finger fracture.  She continues to wear a brace on the left wrist.  When she was seated, not moving, she would take the brace off.  Pain is improving.  She was continue to use the stack splint on the small finger, but it is now too big.  It would slide up very easily.  Pain continues to get better.  Review of Systems: No fevers or chills No numbness or tingling No chest pain No shortness of breath No bowel or bladder dysfunction No GI distress No headaches   Objective: There were no vitals taken for this visit.  Physical Exam:  Elderly female.  No acute distress.    Use a walker to assist with ambulation.  Left wrist without swelling.  No deformity.  No bruising.  Tenderness palpation over the radial styloid.  She tolerates gentle range of motion.  She has good grip strength.  Fingers are warm and well-perfused.   Right hand, with swelling and mild  deformity to the right small finger.  Swelling has improved.  Superficial abrasion to the dorsal aspect of the finger is healing.  Persistent extensor lag.  IMAGING: I personally ordered and reviewed the following images:  X-rays of the left wrist were obtained in clinic today.  These are compared to available x-rays.  Fracture of the radial styloid remains in good position.  Minimal displacement.  No additional injuries noted.  No bony lesions.  Impression: Stable left radial styloid fracture   X-rays of the right small finger were obtained in clinic today.  There is a small fracture of the base of the distal phalanx, with mild displacement.  Mild flexion at the DIP joint.  No dislocation.  No additional injuries noted.  Impression: Right small finger with bony mallet fracture  Oliver Barre, MD 02/11/2023 1:46 PM

## 2023-02-12 ENCOUNTER — Ambulatory Visit: Payer: Self-pay | Admitting: *Deleted

## 2023-02-12 NOTE — Patient Outreach (Incomplete)
  Care Coordination   Follow Up Visit Note   02/13/2023 Name: Amanda Palmer MRN: [id number] DOB: Nov 07, 1933  Amanda Palmer is a 89 y.o. year old female who sees Margo Aye, Kathleene Hazel, MD for primary care. I spoke with  Amanda Palmer by phone today.  What matters to the patients health and wellness today?  No show home health visit last Friday, pcp appointment needed for respiratory worsening symptoms    Remains hard of hearing Home health  Patient discussed with RN CM that Amanda Palmer scheduled 2 visits but did not show on 02/04/23 & 02/07/23. Patient states she request the 02/04/23 be changed to 02/07/23. She informs RN CM that she was "home all day Friday" but did not get a visit With RN CM out reach to [facility] staff.  02/05/23 is listed as the start of care date.  Their records states patient was seen by Palmer on 02/07/23 [place] Await for plan of care approval before more visits Confirmed the patient will receive a call the night before the next appointment for the time, Voiced understanding that it may be better to call her Amanda Palmer   Chronic obstructive pulmonary disease (COPD) Audible hoarseness during outreach  1502 outreach to pcp office for an appointment. Unable to see pcp notes in EPIC.  Left a message requesting an appointment for patient respiratory symptoms (hoarseness, congestion) Sent email to Contact staff, Olegario Messier in pcp  office   Orthopedic- 01/26/23 Fall head injury Palmer fell in a parking lot walking to her car.  Left nondisplaced radial styloid fracture. Right small finger mallet fracture on 02/11/23 - Dr Sharlette Dense thumb spica brace. Keep the brace on at all times except for bathing   02/12/23 1450 Just started last night Could not speak per her niece, Amanda Palmer Denies headache, fever 1456 spoke with [facility] staff about reported no show Palmer visits. Staff able to find notes that indicated the Palmer did see the patient on 02/07/23 The agency is awaiting the plan of care to be approved prior  to calling to reschedule another visit   Goals Addressed             This Visit's Progress    nurse care coordination services (home health medicines, hearing, medications)       Interventions Today    Flowsheet Row Most Recent Value  Chronic Disease   Chronic disease during today's visit Other, Chronic Obstructive Pulmonary Disease (COPD)  [01/26/23 fall]  General Interventions   General Interventions Discussed/Reviewed Doctor Visits, General Interventions Reviewed, Community Resources  Doctor Visits Discussed/Reviewed Doctor Visits Reviewed, PCP, Specialist  PCP/Specialist Visits Compliance with follow-up visit              SDOH assessments and interventions completed:  No{THN Tip this will not be part of the note when signed-REQUIRED REPORT FIELD DO NOT DELETE (Optional):27901}     Care Coordination Interventions:  Yes, provided {THN Tip this will not be part of the note when signed-REQUIRED REPORT FIELD DO NOT DELETE (Optional):27901}  Follow up plan: Follow up call scheduled for ***   Encounter Outcome:  Patient Visit Completed {THN Tip this will not be part of the note when signed-REQUIRED REPORT FIELD DO NOT DELETE (Optional):27901}  Kallyn Demarcus L. Noelle Penner, RN, BSN, [facility]  [facility] Care Management Coordinator  [phone]  Fax: [phone]

## 2023-02-13 DIAGNOSIS — I10 Essential (primary) hypertension: Secondary | ICD-10-CM | POA: Diagnosis not present

## 2023-02-13 DIAGNOSIS — J069 Acute upper respiratory infection, unspecified: Secondary | ICD-10-CM | POA: Diagnosis not present

## 2023-02-13 DIAGNOSIS — S62630D Displaced fracture of distal phalanx of right index finger, subsequent encounter for fracture with routine healing: Secondary | ICD-10-CM | POA: Diagnosis not present

## 2023-02-13 DIAGNOSIS — J449 Chronic obstructive pulmonary disease, unspecified: Secondary | ICD-10-CM | POA: Diagnosis not present

## 2023-02-13 DIAGNOSIS — S52515D Nondisplaced fracture of left radial styloid process, subsequent encounter for closed fracture with routine healing: Secondary | ICD-10-CM | POA: Diagnosis not present

## 2023-02-14 DIAGNOSIS — M80041D Age-related osteoporosis with current pathological fracture, right hand, subsequent encounter for fracture with routine healing: Secondary | ICD-10-CM | POA: Diagnosis not present

## 2023-02-14 DIAGNOSIS — Z7901 Long term (current) use of anticoagulants: Secondary | ICD-10-CM | POA: Diagnosis not present

## 2023-02-14 DIAGNOSIS — Z9181 History of falling: Secondary | ICD-10-CM | POA: Diagnosis not present

## 2023-02-14 DIAGNOSIS — R296 Repeated falls: Secondary | ICD-10-CM | POA: Diagnosis not present

## 2023-02-14 DIAGNOSIS — M80032D Age-related osteoporosis with current pathological fracture, left forearm, subsequent encounter for fracture with routine healing: Secondary | ICD-10-CM | POA: Diagnosis not present

## 2023-02-14 DIAGNOSIS — F419 Anxiety disorder, unspecified: Secondary | ICD-10-CM | POA: Diagnosis not present

## 2023-02-14 DIAGNOSIS — Z9981 Dependence on supplemental oxygen: Secondary | ICD-10-CM | POA: Diagnosis not present

## 2023-02-14 DIAGNOSIS — D509 Iron deficiency anemia, unspecified: Secondary | ICD-10-CM | POA: Diagnosis not present

## 2023-02-14 DIAGNOSIS — I10 Essential (primary) hypertension: Secondary | ICD-10-CM | POA: Diagnosis not present

## 2023-02-14 DIAGNOSIS — I482 Chronic atrial fibrillation, unspecified: Secondary | ICD-10-CM | POA: Diagnosis not present

## 2023-02-14 DIAGNOSIS — I739 Peripheral vascular disease, unspecified: Secondary | ICD-10-CM | POA: Diagnosis not present

## 2023-02-17 DIAGNOSIS — F419 Anxiety disorder, unspecified: Secondary | ICD-10-CM | POA: Diagnosis not present

## 2023-02-17 DIAGNOSIS — I10 Essential (primary) hypertension: Secondary | ICD-10-CM | POA: Diagnosis not present

## 2023-02-17 DIAGNOSIS — I739 Peripheral vascular disease, unspecified: Secondary | ICD-10-CM | POA: Diagnosis not present

## 2023-02-17 DIAGNOSIS — D509 Iron deficiency anemia, unspecified: Secondary | ICD-10-CM | POA: Diagnosis not present

## 2023-02-17 DIAGNOSIS — Z7901 Long term (current) use of anticoagulants: Secondary | ICD-10-CM | POA: Diagnosis not present

## 2023-02-17 DIAGNOSIS — Z9181 History of falling: Secondary | ICD-10-CM | POA: Diagnosis not present

## 2023-02-17 DIAGNOSIS — M80041D Age-related osteoporosis with current pathological fracture, right hand, subsequent encounter for fracture with routine healing: Secondary | ICD-10-CM | POA: Diagnosis not present

## 2023-02-17 DIAGNOSIS — I482 Chronic atrial fibrillation, unspecified: Secondary | ICD-10-CM | POA: Diagnosis not present

## 2023-02-17 DIAGNOSIS — Z9981 Dependence on supplemental oxygen: Secondary | ICD-10-CM | POA: Diagnosis not present

## 2023-02-17 DIAGNOSIS — M80032D Age-related osteoporosis with current pathological fracture, left forearm, subsequent encounter for fracture with routine healing: Secondary | ICD-10-CM | POA: Diagnosis not present

## 2023-02-17 DIAGNOSIS — R296 Repeated falls: Secondary | ICD-10-CM | POA: Diagnosis not present

## 2023-02-19 DIAGNOSIS — J449 Chronic obstructive pulmonary disease, unspecified: Secondary | ICD-10-CM | POA: Diagnosis not present

## 2023-02-19 DIAGNOSIS — D509 Iron deficiency anemia, unspecified: Secondary | ICD-10-CM | POA: Diagnosis not present

## 2023-02-19 DIAGNOSIS — M80041D Age-related osteoporosis with current pathological fracture, right hand, subsequent encounter for fracture with routine healing: Secondary | ICD-10-CM | POA: Diagnosis not present

## 2023-02-19 DIAGNOSIS — Z7901 Long term (current) use of anticoagulants: Secondary | ICD-10-CM | POA: Diagnosis not present

## 2023-02-19 DIAGNOSIS — I739 Peripheral vascular disease, unspecified: Secondary | ICD-10-CM | POA: Diagnosis not present

## 2023-02-19 DIAGNOSIS — Z9181 History of falling: Secondary | ICD-10-CM | POA: Diagnosis not present

## 2023-02-19 DIAGNOSIS — F419 Anxiety disorder, unspecified: Secondary | ICD-10-CM | POA: Diagnosis not present

## 2023-02-19 DIAGNOSIS — M80032D Age-related osteoporosis with current pathological fracture, left forearm, subsequent encounter for fracture with routine healing: Secondary | ICD-10-CM | POA: Diagnosis not present

## 2023-02-19 DIAGNOSIS — R296 Repeated falls: Secondary | ICD-10-CM | POA: Diagnosis not present

## 2023-02-19 DIAGNOSIS — Z9981 Dependence on supplemental oxygen: Secondary | ICD-10-CM | POA: Diagnosis not present

## 2023-02-19 DIAGNOSIS — I482 Chronic atrial fibrillation, unspecified: Secondary | ICD-10-CM | POA: Diagnosis not present

## 2023-02-19 DIAGNOSIS — I10 Essential (primary) hypertension: Secondary | ICD-10-CM | POA: Diagnosis not present

## 2023-02-24 DIAGNOSIS — D509 Iron deficiency anemia, unspecified: Secondary | ICD-10-CM | POA: Diagnosis not present

## 2023-02-24 DIAGNOSIS — M80032D Age-related osteoporosis with current pathological fracture, left forearm, subsequent encounter for fracture with routine healing: Secondary | ICD-10-CM | POA: Diagnosis not present

## 2023-02-24 DIAGNOSIS — Z9181 History of falling: Secondary | ICD-10-CM | POA: Diagnosis not present

## 2023-02-24 DIAGNOSIS — Z9981 Dependence on supplemental oxygen: Secondary | ICD-10-CM | POA: Diagnosis not present

## 2023-02-24 DIAGNOSIS — R296 Repeated falls: Secondary | ICD-10-CM | POA: Diagnosis not present

## 2023-02-24 DIAGNOSIS — M80041D Age-related osteoporosis with current pathological fracture, right hand, subsequent encounter for fracture with routine healing: Secondary | ICD-10-CM | POA: Diagnosis not present

## 2023-02-24 DIAGNOSIS — F419 Anxiety disorder, unspecified: Secondary | ICD-10-CM | POA: Diagnosis not present

## 2023-02-24 DIAGNOSIS — I482 Chronic atrial fibrillation, unspecified: Secondary | ICD-10-CM | POA: Diagnosis not present

## 2023-02-24 DIAGNOSIS — Z7901 Long term (current) use of anticoagulants: Secondary | ICD-10-CM | POA: Diagnosis not present

## 2023-02-24 DIAGNOSIS — I739 Peripheral vascular disease, unspecified: Secondary | ICD-10-CM | POA: Diagnosis not present

## 2023-02-24 DIAGNOSIS — I10 Essential (primary) hypertension: Secondary | ICD-10-CM | POA: Diagnosis not present

## 2023-02-26 DIAGNOSIS — M80041D Age-related osteoporosis with current pathological fracture, right hand, subsequent encounter for fracture with routine healing: Secondary | ICD-10-CM | POA: Diagnosis not present

## 2023-02-26 DIAGNOSIS — I10 Essential (primary) hypertension: Secondary | ICD-10-CM | POA: Diagnosis not present

## 2023-02-26 DIAGNOSIS — R296 Repeated falls: Secondary | ICD-10-CM | POA: Diagnosis not present

## 2023-02-26 DIAGNOSIS — Z7901 Long term (current) use of anticoagulants: Secondary | ICD-10-CM | POA: Diagnosis not present

## 2023-02-26 DIAGNOSIS — Z9181 History of falling: Secondary | ICD-10-CM | POA: Diagnosis not present

## 2023-02-26 DIAGNOSIS — F419 Anxiety disorder, unspecified: Secondary | ICD-10-CM | POA: Diagnosis not present

## 2023-02-26 DIAGNOSIS — I739 Peripheral vascular disease, unspecified: Secondary | ICD-10-CM | POA: Diagnosis not present

## 2023-02-26 DIAGNOSIS — Z9981 Dependence on supplemental oxygen: Secondary | ICD-10-CM | POA: Diagnosis not present

## 2023-02-26 DIAGNOSIS — I482 Chronic atrial fibrillation, unspecified: Secondary | ICD-10-CM | POA: Diagnosis not present

## 2023-02-26 DIAGNOSIS — M80032D Age-related osteoporosis with current pathological fracture, left forearm, subsequent encounter for fracture with routine healing: Secondary | ICD-10-CM | POA: Diagnosis not present

## 2023-02-26 DIAGNOSIS — D509 Iron deficiency anemia, unspecified: Secondary | ICD-10-CM | POA: Diagnosis not present

## 2023-03-03 DIAGNOSIS — Z9981 Dependence on supplemental oxygen: Secondary | ICD-10-CM | POA: Diagnosis not present

## 2023-03-03 DIAGNOSIS — Z7901 Long term (current) use of anticoagulants: Secondary | ICD-10-CM | POA: Diagnosis not present

## 2023-03-03 DIAGNOSIS — R296 Repeated falls: Secondary | ICD-10-CM | POA: Diagnosis not present

## 2023-03-03 DIAGNOSIS — D509 Iron deficiency anemia, unspecified: Secondary | ICD-10-CM | POA: Diagnosis not present

## 2023-03-03 DIAGNOSIS — M80041D Age-related osteoporosis with current pathological fracture, right hand, subsequent encounter for fracture with routine healing: Secondary | ICD-10-CM | POA: Diagnosis not present

## 2023-03-03 DIAGNOSIS — I10 Essential (primary) hypertension: Secondary | ICD-10-CM | POA: Diagnosis not present

## 2023-03-03 DIAGNOSIS — M80032D Age-related osteoporosis with current pathological fracture, left forearm, subsequent encounter for fracture with routine healing: Secondary | ICD-10-CM | POA: Diagnosis not present

## 2023-03-03 DIAGNOSIS — I482 Chronic atrial fibrillation, unspecified: Secondary | ICD-10-CM | POA: Diagnosis not present

## 2023-03-03 DIAGNOSIS — F419 Anxiety disorder, unspecified: Secondary | ICD-10-CM | POA: Diagnosis not present

## 2023-03-03 DIAGNOSIS — Z9181 History of falling: Secondary | ICD-10-CM | POA: Diagnosis not present

## 2023-03-03 DIAGNOSIS — I739 Peripheral vascular disease, unspecified: Secondary | ICD-10-CM | POA: Diagnosis not present

## 2023-03-07 ENCOUNTER — Other Ambulatory Visit (INDEPENDENT_AMBULATORY_CARE_PROVIDER_SITE_OTHER): Payer: Self-pay

## 2023-03-07 ENCOUNTER — Encounter: Payer: Self-pay | Admitting: Orthopedic Surgery

## 2023-03-07 ENCOUNTER — Ambulatory Visit (INDEPENDENT_AMBULATORY_CARE_PROVIDER_SITE_OTHER): Payer: Medicare HMO | Admitting: Orthopedic Surgery

## 2023-03-07 VITALS — Ht 63.0 in | Wt 146.0 lb

## 2023-03-07 DIAGNOSIS — S52515D Nondisplaced fracture of left radial styloid process, subsequent encounter for closed fracture with routine healing: Secondary | ICD-10-CM | POA: Diagnosis not present

## 2023-03-07 DIAGNOSIS — S62636D Displaced fracture of distal phalanx of right little finger, subsequent encounter for fracture with routine healing: Secondary | ICD-10-CM

## 2023-03-07 DIAGNOSIS — M20011 Mallet finger of right finger(s): Secondary | ICD-10-CM

## 2023-03-07 DIAGNOSIS — S62639D Displaced fracture of distal phalanx of unspecified finger, subsequent encounter for fracture with routine healing: Secondary | ICD-10-CM

## 2023-03-07 NOTE — Patient Instructions (Signed)
Continue to use the splint to protect your finger for the next 2 weeks.  Okay to remove after that.  Follow-up in 1 month.

## 2023-03-07 NOTE — Progress Notes (Signed)
Orthopaedic Clinic Return  Assessment: Amanda Palmer is a 89 y.o. female with the following: Left nondisplaced radial styloid fracture Right small finger mallet fracture  Plan: [name] returned to clinic for repeat evaluation of the right small finger and left wrist.  Left wrist is doing much better.  Continues to have swelling and tenderness to the right small finger.  She has been wearing the splint, but has some issues keeping it on.  Radiographs remained stable.  She will likely lose some extension of the DIP joint of the small finger.  However, this is improving.  Recommend continued use of the splint as much as possible for the next 2 weeks.  This should protect the finger.  After that, okay to remove the splint.  Follow up in 4 weeks.  Follow-up: Return in about 4 weeks (around 04/04/2023).   Subjective:  Chief Complaint  Patient presents with   Wrist Pain    L/feels better but have some pain in the thumb at times.   Hand Pain    R/ little finger still hurts and it is bent at the end.    History of Present Illness: Amanda Palmer is a 89 y.o. female who returns to clinic for evaluation of left wrist pain and right small finger fracture.  Left wrist is doing well.  She is not using her brace.  She has been wearing the splint on her right small finger.  She does have some issues keeping it in place.  She noticed that she still has lack of extension at the DIP joint.  Continues to have some tenderness.   Review of Systems: No fevers or chills No numbness or tingling No chest pain No shortness of breath No bowel or bladder dysfunction No GI distress No headaches   Objective: Ht 5\' 3"  (1.6 m)   Wt 146 lb (66.2 kg)   BMI 25.86 kg/m   Physical Exam:  Elderly female.  No acute distress.    Use a walker to assist with ambulation.  Left wrist without swelling.  No deformity.  No bruising.  Tenderness palpation over the radial styloid.  She tolerates gentle range of  motion.  She has good grip strength.  Fingers are warm and well-perfused.   Right hand with swelling and mild deformity to the right small finger.  Persistent extensor lag.  She is able to make a full fist.  Tenderness over the DIP joint.  IMAGING: I personally ordered and reviewed the following images:  X-rays of the left wrist were obtained in clinic today.  These are compared to prior x-rays.  Radial styloid fracture remains minimally displaced.  No callus formation.  No interval displacement.  No bony lesions.  Impression: Healed left radial styloid fracture  X-rays of the right small finger were obtained in clinic today.  These are compared to prior x-rays.  Small fracture off the dorsal base of the distal phalanx remains in stable position.  There is displacement.  No further displacement or change in overall alignment.  No bony lesions.  Impression: Stable left small finger avulsion fracture   Oliver Barre, MD 03/07/2023 11:44 AM

## 2023-03-09 DIAGNOSIS — J449 Chronic obstructive pulmonary disease, unspecified: Secondary | ICD-10-CM | POA: Diagnosis not present

## 2023-03-13 DIAGNOSIS — G9340 Encephalopathy, unspecified: Secondary | ICD-10-CM | POA: Diagnosis not present

## 2023-03-13 DIAGNOSIS — Z9981 Dependence on supplemental oxygen: Secondary | ICD-10-CM | POA: Diagnosis not present

## 2023-03-13 DIAGNOSIS — J441 Chronic obstructive pulmonary disease with (acute) exacerbation: Secondary | ICD-10-CM | POA: Diagnosis not present

## 2023-03-13 DIAGNOSIS — Z743 Need for continuous supervision: Secondary | ICD-10-CM | POA: Diagnosis not present

## 2023-03-13 DIAGNOSIS — I700 Atherosclerosis of aorta: Secondary | ICD-10-CM | POA: Diagnosis not present

## 2023-03-13 DIAGNOSIS — R410 Disorientation, unspecified: Secondary | ICD-10-CM | POA: Diagnosis not present

## 2023-03-13 DIAGNOSIS — K5730 Diverticulosis of large intestine without perforation or abscess without bleeding: Secondary | ICD-10-CM | POA: Diagnosis not present

## 2023-03-13 DIAGNOSIS — G3189 Other specified degenerative diseases of nervous system: Secondary | ICD-10-CM | POA: Diagnosis not present

## 2023-03-13 DIAGNOSIS — M80041D Age-related osteoporosis with current pathological fracture, right hand, subsequent encounter for fracture with routine healing: Secondary | ICD-10-CM | POA: Diagnosis not present

## 2023-03-13 DIAGNOSIS — I739 Peripheral vascular disease, unspecified: Secondary | ICD-10-CM | POA: Diagnosis not present

## 2023-03-13 DIAGNOSIS — E785 Hyperlipidemia, unspecified: Secondary | ICD-10-CM | POA: Diagnosis not present

## 2023-03-13 DIAGNOSIS — R4182 Altered mental status, unspecified: Secondary | ICD-10-CM | POA: Diagnosis not present

## 2023-03-13 DIAGNOSIS — Z20822 Contact with and (suspected) exposure to covid-19: Secondary | ICD-10-CM | POA: Diagnosis not present

## 2023-03-13 DIAGNOSIS — Z7901 Long term (current) use of anticoagulants: Secondary | ICD-10-CM | POA: Diagnosis not present

## 2023-03-13 DIAGNOSIS — R531 Weakness: Secondary | ICD-10-CM | POA: Diagnosis not present

## 2023-03-13 DIAGNOSIS — M80032D Age-related osteoporosis with current pathological fracture, left forearm, subsequent encounter for fracture with routine healing: Secondary | ICD-10-CM | POA: Diagnosis not present

## 2023-03-13 DIAGNOSIS — Z96641 Presence of right artificial hip joint: Secondary | ICD-10-CM | POA: Diagnosis not present

## 2023-03-13 DIAGNOSIS — M51369 Other intervertebral disc degeneration, lumbar region without mention of lumbar back pain or lower extremity pain: Secondary | ICD-10-CM | POA: Diagnosis not present

## 2023-03-13 DIAGNOSIS — Z9181 History of falling: Secondary | ICD-10-CM | POA: Diagnosis not present

## 2023-03-13 DIAGNOSIS — F419 Anxiety disorder, unspecified: Secondary | ICD-10-CM | POA: Diagnosis not present

## 2023-03-13 DIAGNOSIS — R296 Repeated falls: Secondary | ICD-10-CM | POA: Diagnosis not present

## 2023-03-13 DIAGNOSIS — I10 Essential (primary) hypertension: Secondary | ICD-10-CM | POA: Diagnosis not present

## 2023-03-13 DIAGNOSIS — I6782 Cerebral ischemia: Secondary | ICD-10-CM | POA: Diagnosis not present

## 2023-03-13 DIAGNOSIS — I482 Chronic atrial fibrillation, unspecified: Secondary | ICD-10-CM | POA: Diagnosis not present

## 2023-03-13 DIAGNOSIS — R918 Other nonspecific abnormal finding of lung field: Secondary | ICD-10-CM | POA: Diagnosis not present

## 2023-03-13 DIAGNOSIS — D509 Iron deficiency anemia, unspecified: Secondary | ICD-10-CM | POA: Diagnosis not present

## 2023-03-14 DIAGNOSIS — I161 Hypertensive emergency: Secondary | ICD-10-CM | POA: Diagnosis not present

## 2023-03-14 DIAGNOSIS — E785 Hyperlipidemia, unspecified: Secondary | ICD-10-CM | POA: Diagnosis not present

## 2023-03-14 DIAGNOSIS — R4182 Altered mental status, unspecified: Secondary | ICD-10-CM | POA: Diagnosis not present

## 2023-03-14 DIAGNOSIS — Z79899 Other long term (current) drug therapy: Secondary | ICD-10-CM | POA: Diagnosis not present

## 2023-03-14 DIAGNOSIS — I272 Pulmonary hypertension, unspecified: Secondary | ICD-10-CM | POA: Diagnosis not present

## 2023-03-14 DIAGNOSIS — F32A Depression, unspecified: Secondary | ICD-10-CM | POA: Diagnosis not present

## 2023-03-14 DIAGNOSIS — I071 Rheumatic tricuspid insufficiency: Secondary | ICD-10-CM | POA: Diagnosis not present

## 2023-03-14 DIAGNOSIS — F419 Anxiety disorder, unspecified: Secondary | ICD-10-CM | POA: Diagnosis not present

## 2023-03-14 DIAGNOSIS — I340 Nonrheumatic mitral (valve) insufficiency: Secondary | ICD-10-CM | POA: Diagnosis not present

## 2023-03-14 DIAGNOSIS — I10 Essential (primary) hypertension: Secondary | ICD-10-CM | POA: Diagnosis not present

## 2023-03-14 DIAGNOSIS — Z7901 Long term (current) use of anticoagulants: Secondary | ICD-10-CM | POA: Diagnosis not present

## 2023-03-14 DIAGNOSIS — I4891 Unspecified atrial fibrillation: Secondary | ICD-10-CM | POA: Diagnosis not present

## 2023-03-14 DIAGNOSIS — J449 Chronic obstructive pulmonary disease, unspecified: Secondary | ICD-10-CM | POA: Diagnosis not present

## 2023-03-15 DIAGNOSIS — I480 Paroxysmal atrial fibrillation: Secondary | ICD-10-CM | POA: Diagnosis not present

## 2023-03-15 DIAGNOSIS — Z79899 Other long term (current) drug therapy: Secondary | ICD-10-CM | POA: Diagnosis not present

## 2023-03-15 DIAGNOSIS — I161 Hypertensive emergency: Secondary | ICD-10-CM | POA: Insufficient documentation

## 2023-03-15 DIAGNOSIS — E785 Hyperlipidemia, unspecified: Secondary | ICD-10-CM | POA: Diagnosis not present

## 2023-03-15 DIAGNOSIS — F419 Anxiety disorder, unspecified: Secondary | ICD-10-CM | POA: Diagnosis not present

## 2023-03-15 DIAGNOSIS — I10 Essential (primary) hypertension: Secondary | ICD-10-CM | POA: Diagnosis not present

## 2023-03-15 DIAGNOSIS — R4182 Altered mental status, unspecified: Secondary | ICD-10-CM | POA: Insufficient documentation

## 2023-03-15 DIAGNOSIS — J449 Chronic obstructive pulmonary disease, unspecified: Secondary | ICD-10-CM | POA: Diagnosis not present

## 2023-03-15 DIAGNOSIS — Z7901 Long term (current) use of anticoagulants: Secondary | ICD-10-CM | POA: Diagnosis not present

## 2023-03-15 DIAGNOSIS — F32A Depression, unspecified: Secondary | ICD-10-CM | POA: Diagnosis not present

## 2023-03-16 DIAGNOSIS — Z7901 Long term (current) use of anticoagulants: Secondary | ICD-10-CM | POA: Diagnosis not present

## 2023-03-16 DIAGNOSIS — R001 Bradycardia, unspecified: Secondary | ICD-10-CM | POA: Diagnosis not present

## 2023-03-16 DIAGNOSIS — E785 Hyperlipidemia, unspecified: Secondary | ICD-10-CM | POA: Diagnosis not present

## 2023-03-16 DIAGNOSIS — R531 Weakness: Secondary | ICD-10-CM | POA: Diagnosis not present

## 2023-03-16 DIAGNOSIS — Z882 Allergy status to sulfonamides status: Secondary | ICD-10-CM | POA: Diagnosis not present

## 2023-03-16 DIAGNOSIS — I452 Bifascicular block: Secondary | ICD-10-CM | POA: Diagnosis not present

## 2023-03-16 DIAGNOSIS — F0390 Unspecified dementia without behavioral disturbance: Secondary | ICD-10-CM | POA: Diagnosis not present

## 2023-03-16 DIAGNOSIS — I5032 Chronic diastolic (congestive) heart failure: Secondary | ICD-10-CM | POA: Diagnosis not present

## 2023-03-16 DIAGNOSIS — I110 Hypertensive heart disease with heart failure: Secondary | ICD-10-CM | POA: Diagnosis not present

## 2023-03-16 DIAGNOSIS — Z1152 Encounter for screening for COVID-19: Secondary | ICD-10-CM | POA: Diagnosis not present

## 2023-03-16 DIAGNOSIS — F0280 Dementia in other diseases classified elsewhere without behavioral disturbance: Secondary | ICD-10-CM | POA: Diagnosis not present

## 2023-03-16 DIAGNOSIS — F411 Generalized anxiety disorder: Secondary | ICD-10-CM | POA: Diagnosis not present

## 2023-03-16 DIAGNOSIS — R4182 Altered mental status, unspecified: Secondary | ICD-10-CM | POA: Diagnosis not present

## 2023-03-16 DIAGNOSIS — I959 Hypotension, unspecified: Secondary | ICD-10-CM | POA: Diagnosis not present

## 2023-03-16 DIAGNOSIS — G9340 Encephalopathy, unspecified: Secondary | ICD-10-CM | POA: Diagnosis not present

## 2023-03-16 DIAGNOSIS — J449 Chronic obstructive pulmonary disease, unspecified: Secondary | ICD-10-CM | POA: Diagnosis not present

## 2023-03-16 DIAGNOSIS — R7989 Other specified abnormal findings of blood chemistry: Secondary | ICD-10-CM | POA: Diagnosis not present

## 2023-03-16 DIAGNOSIS — F32A Depression, unspecified: Secondary | ICD-10-CM | POA: Diagnosis not present

## 2023-03-16 DIAGNOSIS — Z79899 Other long term (current) drug therapy: Secondary | ICD-10-CM | POA: Diagnosis not present

## 2023-03-16 DIAGNOSIS — I10 Essential (primary) hypertension: Secondary | ICD-10-CM | POA: Diagnosis not present

## 2023-03-16 DIAGNOSIS — R413 Other amnesia: Secondary | ICD-10-CM | POA: Diagnosis not present

## 2023-03-16 DIAGNOSIS — I161 Hypertensive emergency: Secondary | ICD-10-CM | POA: Diagnosis not present

## 2023-03-16 DIAGNOSIS — I480 Paroxysmal atrial fibrillation: Secondary | ICD-10-CM | POA: Diagnosis not present

## 2023-03-16 DIAGNOSIS — Z8659 Personal history of other mental and behavioral disorders: Secondary | ICD-10-CM | POA: Diagnosis not present

## 2023-03-16 DIAGNOSIS — M1990 Unspecified osteoarthritis, unspecified site: Secondary | ICD-10-CM | POA: Diagnosis not present

## 2023-03-16 DIAGNOSIS — F419 Anxiety disorder, unspecified: Secondary | ICD-10-CM | POA: Diagnosis not present

## 2023-03-16 DIAGNOSIS — Z7401 Bed confinement status: Secondary | ICD-10-CM | POA: Diagnosis not present

## 2023-03-16 DIAGNOSIS — I674 Hypertensive encephalopathy: Secondary | ICD-10-CM | POA: Diagnosis not present

## 2023-03-16 DIAGNOSIS — N179 Acute kidney failure, unspecified: Secondary | ICD-10-CM | POA: Diagnosis not present

## 2023-03-16 DIAGNOSIS — Z880 Allergy status to penicillin: Secondary | ICD-10-CM | POA: Diagnosis not present

## 2023-03-16 DIAGNOSIS — I340 Nonrheumatic mitral (valve) insufficiency: Secondary | ICD-10-CM | POA: Diagnosis not present

## 2023-03-16 DIAGNOSIS — Z7902 Long term (current) use of antithrombotics/antiplatelets: Secondary | ICD-10-CM | POA: Diagnosis not present

## 2023-03-16 DIAGNOSIS — R5381 Other malaise: Secondary | ICD-10-CM | POA: Diagnosis not present

## 2023-03-16 DIAGNOSIS — G9389 Other specified disorders of brain: Secondary | ICD-10-CM | POA: Diagnosis not present

## 2023-03-16 DIAGNOSIS — Z8679 Personal history of other diseases of the circulatory system: Secondary | ICD-10-CM | POA: Diagnosis not present

## 2023-03-16 DIAGNOSIS — K219 Gastro-esophageal reflux disease without esophagitis: Secondary | ICD-10-CM | POA: Diagnosis not present

## 2023-03-16 DIAGNOSIS — I272 Pulmonary hypertension, unspecified: Secondary | ICD-10-CM | POA: Diagnosis not present

## 2023-03-16 DIAGNOSIS — Z885 Allergy status to narcotic agent status: Secondary | ICD-10-CM | POA: Diagnosis not present

## 2023-03-25 DIAGNOSIS — F0280 Dementia in other diseases classified elsewhere without behavioral disturbance: Secondary | ICD-10-CM | POA: Diagnosis not present

## 2023-03-25 DIAGNOSIS — R531 Weakness: Secondary | ICD-10-CM | POA: Diagnosis not present

## 2023-03-25 DIAGNOSIS — J449 Chronic obstructive pulmonary disease, unspecified: Secondary | ICD-10-CM | POA: Diagnosis not present

## 2023-03-25 DIAGNOSIS — G9340 Encephalopathy, unspecified: Secondary | ICD-10-CM | POA: Diagnosis not present

## 2023-03-25 DIAGNOSIS — F411 Generalized anxiety disorder: Secondary | ICD-10-CM | POA: Diagnosis not present

## 2023-03-25 NOTE — Discharge Summary (Signed)
 Hospitalist Discharge Summary   Discharge date:   March 25, 2023 Length of stay:    LOS: 9 days    Discharge Service:   [facility] Hospitalists Discharge Attending Physician: Savannah Houston, MD Discharge to:    To Skilled Nursing Facility Condition at Discharge:  stable   Mental Status On day of Discharge:  The patient is Alert and oriented to PERSON The patient is Alert And oriented to TIME The patient is Alert and oriented to [name] course based on timeline of significant events after admission (by date): No notes on file     Admission HPI   Patient admitted on: 03/13/2023  2:01 PM  Patient admitted by: Thersia Roger, DO  CHIEF COMPLAINT: Altered mental status/resolved cancel  Day of admission HPI:      This is a 89 y.o. female with a known history of anxiety/depression, COPD, HTN, HLD, GERD, OA presents to the emergency department for evaluation of  AMS. Patient reportedly lives alone and is functionally independent.  Per records niece stated that she has been confused for about one week.  She was found on the floor by her physical therapist this afternoon.    Patient has no complaints.  States that she lives alone, has been taking her meds and appetite is normal.        Patient denies fevers/chills, weakness, dizziness, chest pain, shortness of breath, N/V/C/D, abdominal pain, dysuria/frequency, changes in mental status.  In the ED the patient workup did not yield a cause for her AMS. Medical admission was requested for further workup and management.   Patient has been admitted as an inpatient to the hospitalist service for further evaluation and treatment of her hypertensive emergency, she was seen by cardiology subsequently.   Her blood pressure has been better controlled.  Elevated BNP/CHF secondary to uncontrolled hypertension, her last EF showed a LV function of 55% with grade 2 diastolic dysfunction all other acute and chronic comorbid's were addressed.   Patient was also found to have A-fib/PAF and has been initiated on apixaban  amiodarone  and metoprolol  for rate control.  On the day of discharge she was comfortably sitting in her recliner without any supplemental oxygen  denied any acute complaints and is anxious to leave the hospital.  All acute and chronic comorbid's were addressed.      Patient admitted on Home O2? - No Patient on home anticoagulant? -  no Patient admitted with Chronic home foley catheter? - No Foley catheter placed or replaced by another service prior to admission? - No   Mental Status on Admission: The patient is Alert and oriented to PERSON The patient is NOT Alert And oriented to TIME The patient is NOT Alert and oriented to LOCATION       All acute and chronic comorbid's were addressed.  On the day of discharge patient was hemodynamically stable  There were no barriers to any communication   Problem List, Assessment & Plan    ASSESSMENT & PLAN (In order of descending acuity)  Hypertensive encephalopathy [resolved] History of dementia AKI resolved  Chronic medical problems COPD Dyslipidemia Anxiety depression Debility  ADDITIONAL PHYSICAL EXAM FINDINGS Elderly female sitting in recliner denies any acute complaints.  Patient has upper extremity senile purpura  GEN: In no acute distress, well-appearing, lying down in bed, able to follow commands or answer questions. HEENT: PERRLA, EOMI, no oral lesions, moist oral cavity.  NECK: No noticeable or palpable swelling, redness or rash. No thyromegaley supple. CARDIOVASCULAR: RRR, S1, S2  normal -without murmur/gallops/rub, no JVD. LUNGS: bilateral lung fields clear to auscultation, no use of accessory muscles, no crackles or wheezes. SKIN: Decree skin turgor, senile purpura noted over upper extremities  ABDOMEN: Soft, nontender, nondistended.  No rebound/guarding. BS+ EXTREMITIES: No edema, cyanosis or clubbing.  Generalized muscular atrophy MUSCULAR  SKELETON: Good ROM or strength, no swollen or erythematous joints. NEURO: Alert oriented to time, place and person, sensation intact. No focal deficits. Reflexes wnl.  Motor and sensory system grossly intact- gait not assessed LN -No lymph adenopathy over the Cervical or Inguinal Lymph nodes    Vital Signs: BP 142/49   Pulse 66   Temp 37.2 C (99 F) (Axillary)   Resp 16   Ht 160 cm (5' 2.99)   Wt 54.2 kg (119 lb 6.4 oz)   SpO2 96%   BMI 21.16 kg/m   Nutrition:                                   Patient does not meet AND/ASPEN criteria for malnutrition at this time (03/21/23 1145)              CODE STATUS :                    Full Code      Discharge Medications     Your Medication List     STOP taking these medications    esomeprazole  20 MG capsule Commonly known as: NEXIUM  Replaced by: pantoprazole  40 MG tablet   ibuprofen 400 MG tablet Commonly known as: MOTRIN   oxyCODONE -acetaminophen  5-325 mg per tablet Commonly known as: PERCOCET       START taking these medications    aluminum-magnesium  hydroxide-simethicone  400-400-40 mg/5 mL suspension Commonly known as: MAALOX MAX Take 30 mL by mouth every four (4) hours as needed for up to 7 days.   amiodarone  200 MG tablet Commonly known as: PACERONE  Take 1 tablet (200 mg total) by mouth daily. Start taking on: March 26, 2023   apixaban  2.5 mg Tab Commonly known as: ELIQUIS  Take 1 tablet (2.5 mg total) by mouth two (2) times a day.   pantoprazole  40 MG tablet Commonly known as: Protonix  Take 1 tablet (40 mg total) by mouth daily before breakfast. Start taking on: March 26, 2023 Replaces: esomeprazole  20 MG capsule   senna-docusate 8.6-50 mg Commonly known as: PERICOLACE Take 1 tablet by mouth nightly.   thiamine mononitrate (vit B1) 100 mg Tab tablet Take 1 tablet (100 mg total) by mouth daily. Start taking on: March 26, 2023       CHANGE how you take these medications     famotidine  10 MG tablet Commonly known as: PEPCID  Take 1 tablet (10 mg total) by mouth daily. Start taking on: March 26, 2023 What changed:  medication strength how much to take how to take this when to take this   hydrALAZINE  25 MG tablet Commonly known as: APRESOLINE  Take 1 tablet (25 mg total) by mouth Three (3) times a day. What changed:  medication strength how much to take   losartan  100 MG tablet Commonly known as: COZAAR  Take 1 tablet (100 mg total) by mouth daily. Start taking on: March 26, 2023 What changed:  medication strength how much to take   metoPROLOL  tartrate 25 MG tablet Commonly known as: Lopressor  Take 0.5 tablets (12.5 mg total) by mouth two (2) times a day. What  changed: See the new instructions.       CONTINUE taking these medications    atorvastatin  20 MG tablet Commonly known as: LIPITOR Take 20 mg by mouth daily.   calcium -vitamin D3-vitamin K 500 mg-500 unit -40 mcg Chew Chew 1 tablet.   clonazePAM  1 MG tablet Commonly known as: KlonoPIN  TAKE 1 2 TO 1 (ONE HALF TO ONE) TABLET BY MOUTH AT BEDTIME AS NEEDED   cyanocobalamin 100 MCG tablet Take 100 mcg by mouth.   ferrous sulfate 325 (65 FE) MG tablet Take 325 mg by mouth.   gabapentin  300 MG capsule Commonly known as: NEURONTIN    hydroCHLOROthiazide  12.5 mg capsule Commonly known as: MICROZIDE  Take by mouth daily.   HYDROcodone -acetaminophen  5-325 mg per tablet Commonly known as: NORCO TAKE 1 TABLET BY MOUTH EVERY 8 HOURS AS NEEDED FOR SEVERE PAIN   ondansetron  4 MG disintegrating tablet Commonly known as: ZOFRAN -ODT Take 1-2 tablets (4-8 mg total) by mouth every eight (8) hours as needed for nausea for up to 16 doses.   triamcinolone  0.5 % cream Commonly known as: KENALOG    valACYclovir 1000 MG tablet Commonly known as: VALTREX Take 1 tablet by mouth.   venlafaxine  75 MG tablet Commonly known as: EFFEXOR         ____________________________________________  Discharge Instructions   Nutrition: Heart healthy diet                                  Activity:   As tolerated                                Activity Instructions     Activity as tolerated         Appointments: Follow with primary care physician and subspecialist                 Follow Up:    As scheduled                          Follow Up instructions and Outpatient Referrals    Ambulatory Referral to Palliative Care     Reason for referral: Dementia; lives alone; copd   Is this a palliative referral for:  symptom management coping support     Ambulatory Referral to Cardiology     Reason for referral: Congestive heart failure/paroxysmal atrial  fibrillation   Specific Service Requested: General Cardiology   Call MD for:  difficulty breathing, headache or visual disturbances     Call MD for:  persistent dizziness or light-headedness     Call MD for:  persistent nausea or vomiting     Call MD for:  redness, tenderness, or signs of infection (pain, swelling,  redness, odor or green/yellow discharge around incision site)     Call MD for:  severe uncontrolled pain     Call MD for: Temperature > 38.5 Celsius ( > 101.3 Fahrenheit)     Schedule Follow-up visit with Primary Care Physician     Reason for referral: Follow with primary care physician  posthospitalization, hypertension/PAF/CHF      I spent greater than 30 minutes counseling and coordinating care for the discharge of this patient. The diagnosis, follow-up appointments, compliance, side effects of medications were discussed in detail.  All labs and radiological procedures  were discussed with patient  Patient was encouraged to  follow-up with primary care physician and keep all appointments with subspecialists      Lab Results   Recent Labs    Units 03/19/23 0615 03/20/23 0456 03/21/23 0536 03/22/23 0625  WBC 10*9/L 7.4 5.1 8.2 5.8  HGB g/dL 87.0 87.1 87.4 87.3   HCT % 40.4 38.4 38.4 37.7  PLT 10*9/L 266 286 253 288   Recent Labs    Units 03/19/23 0615 03/20/23 0457 03/21/23 0536 03/22/23 0625 03/25/23 0522  NA mmol/L 140 140 141 139 137  K mmol/L 4.4 4.5 4.5 4.6 4.7  CL mmol/L 103 102 104 105 102  CO2 mmol/L 31.9 30.1 30.3 29.4 30.2  BUN mg/dL 37* 34* 31* 25* 22*  CREATININE mg/dL 8.67* 8.92 8.81* 8.94 8.78*  GLU mg/dL 896 894 894 99 96  CALCIUM  mg/dL 89.9 9.4 9.7 9.4 9.7   No results for input(s): CKTOTAL, TROPONINI, TROPONINT, CKMB, EDTPNI, BNP, CHOL, LDL, HDL, TRIG in the last 168 hours. No results for input(s): INR, LABPROT, APTT, DDIMER in the last 168 hours. No results for input(s): TSH, ETOH, ACETAMIN, SALICYLATE, FREET3, T4FREE, A1C, TTR, ESR, CRP, HSCRP, ANA, RF, VITAMINB12, FOLATE, IRON, LABIRON, TIBC, FERRITIN, RETIC, RETICCTPCT, C3, C4, LDH, HAPTM, URICACID, HEPP4, CEA, RAPSCRN, CDIFRPCR, CDIFFNAP1, HIV12SCRN, HIVCP, TBCELLQN in the last 168 hours.  Invalid input(s): HIVSCRN  No results for input(s): HEPAIGM, HEPBSAG, HEPBIGM, HEPCAB, MITOAB in the last 168 hours. No results for input(s): WBCUA, NITRITE, LEUKOCYTESUR, BACTERIA, RBCUA, BLOODU, GLUCOSEU, PROTEINUA, KETONESU in the last 168 hours. No results for input(s): KETUR, PREGTESTUR, PREGPOC, NAURINE, LABOSMO, OSMOFT, PROTEINUR, CREATUR, PCRATIOUR, OPIAU, BENZU, TRICYCLIC, PCPU, AMPHU, COCAU, CANNAU, BARBU, URPROTELEC in the last 168 hours. No results for input(s): FTYP1, WBCFLUID, FNEUT, LYMPHSFL, FMONO, EOSFL, RBCFL, CLARITYFLUID, COLORFL in the last 168 hours. No results for input(s): O2SOUR, FIO2ART, PHART, PCO2ART, PO2ART, HCO3ART, O2SATART, BEART in the last 72 hours.  Imaging  ECG 12 Lead Result Date: 03/25/2023 Sinus bradycardia Right bundle branch block Left anterior  fascicular block Bifascicular block Abnormal ECG When compared with ECG of 13-Jan-2022 04:08, Right bundle branch block has replaced Incomplete right bundle branch block Criteria for Septal infarct are no longer present Confirmed by Rosamond Codding ([id number]) on 03/25/2023 9:21:12 AM  Echocardiogram W Colorflow Spectral Doppler Result Date: 03/14/2023 Patient Info Name:     Amanda Palmer Age:     62 years DOB:     December 22, 1933 Gender:     Female MRN:     [id number] Accession #:     [id number] [facility] Account #:     [id number] Ht:     158 cm Wt:     63 kg BSA:     1.67 m2 BP:     191 /     41 mmHg HR:     61 bpm Exam Date:     03/14/2023 10:11 AM Admit Date:     03/13/2023 Exam Type:     ECHOCARDIOGRAM W COLORFLOW SPECTRAL DOPPLER Technical Quality:     Fair Staff Sonographer:     Mliss Gate Supervising Physician:     Concha Lonni An MD Study Info Indications      - ELevated BNP Procedure(s)   Complete two-dimensional, color flow and Doppler transthoracic echocardiogram is performed. Summary   1. The left ventricle is normal in size with normal wall thickness.   2. The left ventricular systolic function is normal, LVEF is visually estimated at > 55%.   3. There is grade II diastolic dysfunction (elevated filling pressure).  4. There is mild mitral valve regurgitation.   5. The aortic valve is trileaflet with mildly thickened leaflets with normal excursion.   6. The right ventricle is normal in size, with normal systolic function.   7. There is moderate pulmonary hypertension. Left Ventricle   The left ventricle is normal in size with normal wall thickness. The left ventricular systolic function is normal, LVEF is visually estimated at > 55%. There is grade II diastolic dysfunction (elevated filling pressure). Right Ventricle   The right ventricle is normal in size, with normal systolic function. Left Atrium   The left atrium is normal in size. Right Atrium   The right atrium is normal in size. Aortic Valve    The aortic valve is trileaflet with mildly thickened leaflets with normal excursion. There is no significant aortic regurgitation. There is no evidence of a significant transvalvular gradient. Mitral Valve   The mitral valve leaflets are mildly thickened with normal leaflet mobility. Mitral annular calcification is present (mild). There is mild mitral valve regurgitation. Tricuspid Valve   The tricuspid valve leaflets are normal, with normal leaflet mobility. There is mild tricuspid regurgitation. There is moderate pulmonary hypertension. TR maximum velocity: 3.5 m/s  Estimated PASP: 52 mmHg. Pulmonic Valve   Pulmonary valve is not well visualized. There is no significant pulmonic regurgitation. There is no evidence of a significant transvalvular gradient. Aorta   The aorta is normal in size in the visualized segments. Inferior Vena Cava   IVC size and inspiratory change suggest normal right atrial pressure. (0-5 mmHg). Pericardium/Pleural   There is no pericardial effusion. Other Findings   Rhythm: Sinus Rhythm. Ventricles ---------------------------------------------------------------------- Name                                 Value        Normal ---------------------------------------------------------------------- LV Dimensions 2D/MM ----------------------------------------------------------------------  IVS Diastolic Thickness (2D)                                1.0 cm       0.6-0.9 LVID Diastole (2D)                  3.9 cm       3.8-5.2  LVPW Diastolic Thickness (2D)                                0.9 cm       0.6-0.9 LVID Systole (2D)                   2.5 cm       2.2-3.5 LVOT Diameter                       1.3 cm               LV Mass Index (2D Cubed)           68 g/m2         43-95  Relative Wall Thickness (2D)                                  0.46        <=0.42 LV Function ---------------------------------------------------------------------- LV EF (4C MOD)  54 %                LV  Diastolic Volume Index (BP MOD)                        22.7 ml/m2     29.0-61.0 LV EF (BP MOD)                        68 %         54-74 RV Dimensions 2D/MM ----------------------------------------------------------------------  RV Basal Diastolic Dimension                           3.1 cm       2.5-4.1 TAPSE                               2.3 cm         >=1.7 Atria ---------------------------------------------------------------------- Name                                 Value        Normal ---------------------------------------------------------------------- LA Dimensions ---------------------------------------------------------------------- LA Dimension (2D)                   3.3 cm       2.7-3.8 LA Volume Index (4C A-L)        25.14 ml/m2               LA Volume Index (2C A-L)        31.51 ml/m2               LA Volume (BP MOD)                   44 ml               LA Volume Index (BP MOD)        26.44 ml/m2   16.00-34.00 RA Dimensions ---------------------------------------------------------------------- RA Area (4C)                      10.6 cm2        <=18.0 RA Area (4C) Index              6.4 cm2/m2               RA ESV Index (4C MOD)             12 ml/m2         15-27 Left Ventricular Outflow Tract ---------------------------------------------------------------------- Name                                 Value        Normal ---------------------------------------------------------------------- LVOT 2D ---------------------------------------------------------------------- LVOT Diameter                       1.3 cm               LVOT Area                          1.3 cm2 Aortic Valve ---------------------------------------------------------------------- Name  Value        Normal ---------------------------------------------------------------------- AV Doppler ---------------------------------------------------------------------- AV Peak Velocity                   1.5 m/s                AV Peak Gradient                    9 mmHg               AV Mean Gradient                    5 mmHg               AV VTI                               37 cm Mitral Valve ---------------------------------------------------------------------- Name                                 Value        Normal ---------------------------------------------------------------------- MV Diastolic Function ---------------------------------------------------------------------- MV E Peak Velocity                 97 cm/s               MV A Peak Velocity                 58 cm/s               MV E/A                                 1.7               MV Annular TDI ---------------------------------------------------------------------- MV Septal e' Velocity             4.8 cm/s         >=8.0 MV E/e' (Septal)                      20.3               MV Lateral e' Velocity            5.6 cm/s        >=10.0 MV E/e' (Lateral)                     17.5               MV e' Average                     5.2 cm/s               MV E/e' (Average)                     18.9 Tricuspid Valve ---------------------------------------------------------------------- Name                                 Value        Normal ---------------------------------------------------------------------- TV Regurgitation Doppler ---------------------------------------------------------------------- TR Peak Velocity                   3.5  m/s               Estimated PAP/RSVP ---------------------------------------------------------------------- RA Pressure                         3 mmHg           <=5 RV Systolic Pressure               52 mmHg           <36 Aorta ---------------------------------------------------------------------- Name                                 Value        Normal ---------------------------------------------------------------------- Ascending Aorta ---------------------------------------------------------------------- Ao Root Diameter (2D)                2.6 cm               Ao Root Diam Index (2D)          1.6 cm/m2               Ascending Aorta Diameter            2.6 cm Venous ---------------------------------------------------------------------- Name                                 Value        Normal ---------------------------------------------------------------------- IVC/SVC ---------------------------------------------------------------------- IVC Diameter (Insp 2D)              0.5 cm               IVC Diameter (Exp 2D)               0.9 cm         <=2.1  IVC Diameter Percent Change (2D)                                  44 %          >=50 Report Signatures Finalized by Concha Lonni An  MD on 03/14/2023 12:28 PM  CT Pelvis Wo Contrast Result Date: 03/13/2023 CLINICAL DATA:  Status post fall.  Abnormal radiograph. EXAM: CT PELVIS WITHOUT CONTRAST TECHNIQUE: Multidetector CT imaging of the pelvis was performed following the standard protocol without intravenous contrast. RADIATION DOSE REDUCTION: This exam was performed according to the departmental dose-optimization program which includes automated exposure control, adjustment of the mA and/or kV according to patient size and/or use of iterative reconstruction technique. COMPARISON:  Pelvis radiograph earlier today FINDINGS: Urinary Tract:  Decompressed urinary bladder. Bowel: Sigmoid diverticulosis. No inflammation of pelvic bowel loops. Vascular/Lymphatic: Aortic and branch atherosclerosis. No pelvic adenopathy. Reproductive:  Hysterectomy. Other:  No pelvic free fluid. Musculoskeletal: The contour deformity of the left femoral head neck junction on radiograph corresponds to degenerative osteophyte/spur. No acute fracture. There is a joint space narrowing and mild subchondral cystic change of the acetabulum. There is no hip dislocation. The pubic rami are intact. Moderate degenerative change of the pubic symphysis. Right hip arthroplasty. Lumbosacral degenerative change with lower lumbar  spinal fusion hardware, partially included in the field of view no sacroiliac diastasis. Confluent click hematoma.   1. No acute fracture or dislocation of the pelvis. The contour deformity of the left femoral head neck junction on radiograph  corresponds to degenerative osteophyte/spur. 2. Moderate degenerative change of the left hip. 3. Sigmoid diverticulosis. Aortic Atherosclerosis (ICD10-I70.0). Electronically Signed   By: Andrea Gasman M.D.   On: 03/13/2023 20:39   XR Pelvis 1 Or 2 Views Result Date: 03/13/2023 CLINICAL DATA:  Fall, found down on floor.  Confusion. EXAM: PELVIS - 1-2 VIEW COMPARISON:  CT pelvis 01/27/2017 FINDINGS: Right total hip prosthesis. Prominent loss of articular space in the left hip. Irregular contour at the junction of the left femoral head and neck potentially from spurring or fracture, the physical exam is indeterminate in this region than CT of the pelvis with attention to the left subcapital region would be suggested. Posterolateral rod and pedicle screw fixation at L4-5. Endplate sclerosis at multiple levels in the lumbar spine, likely due to degenerative endplate disease.   1. Irregular contour at the junction of the left femoral head and neck potentially from spurring or fracture, the physical exam is indeterminate in this region than CT of the pelvis with attention to the left subcapital region would be suggested. 2. Right total hip prosthesis. 3. Posterolateral rod and pedicle screw fixation at L4-5. 4. Degenerative endplate disease in the lumbar spine. Electronically Signed   By: Ryan Salvage M.D.   On: 03/13/2023 18:13   CT Head Wo Contrast Result Date: 03/13/2023 CLINICAL DATA:  Fall.  Increased confusion. EXAM: CT HEAD WITHOUT CONTRAST CT CERVICAL SPINE WITHOUT CONTRAST TECHNIQUE: Multidetector CT imaging of the head and cervical spine was performed following the standard protocol without intravenous contrast. Multiplanar CT image reconstructions of the  cervical spine were also generated. RADIATION DOSE REDUCTION: This exam was performed according to the departmental dose-optimization program which includes automated exposure control, adjustment of the mA and/or kV according to patient size and/or use of iterative reconstruction technique. COMPARISON:  CT head and cervical spine 01/26/2023 FINDINGS: CT HEAD FINDINGS Brain: There is no evidence of an acute infarct, intracranial hemorrhage, mass, midline shift, or extra-axial fluid collection. Patchy cerebral white matter hypodensities are unchanged and nonspecific but compatible with moderate chronic small vessel ischemic disease. There is mild cerebral atrophy. Vascular: Calcified atherosclerosis at the skull base. No hyperdense vessel. Skull: No acute fracture or suspicious osseous lesion. Sinuses/Orbits: Unchanged posterior left ethmoid air cell opacification. Clear mastoid air cells. Bilateral cataract extraction. Other: None. CT CERVICAL SPINE FINDINGS Alignment: Unchanged grade 1 retrolisthesis of C3 on C4 and grade 1 anterolisthesis of C4 on C5 in C5 on C6, likely degenerative. Skull base and vertebrae: No acute fracture or suspicious osseous lesion. Unchanged T1 and T3 superior endplate Schmorl's nodes. Soft tissues and spinal canal: No prevertebral fluid or swelling. No visible canal hematoma. Disc levels: Unchanged appearance of multilevel disc degeneration, particularly advanced at C6-7. Advanced multilevel facet arthrosis. No evidence of high-grade spinal canal stenosis. Upper chest: Chronic asymmetric mild mosaic attenuation in the right lung apex. Unchanged 4 mm nodule in the left upper lobe (series 3, image 85). Other: Unchanged 1.3 cm right thyroid  nodule for which no follow-up imaging is recommended. Mild atherosclerotic calcification at the carotid bifurcations.   1. No evidence of acute intracranial abnormality or acute cervical spine fracture. 2. Moderate chronic small vessel ischemic disease.  3. 4 mm left upper lobe pulmonary nodule. Multiple lung nodules were described on a chest CT from 01/13/2022 whose images are not currently available. A recommendation was made at that time for a follow-up chest CT in 1 year. If further evaluation is clinically warranted considering patient's age and comorbidities,  a nonemergent chest CT could be performed as an outpatient. Electronically Signed   By: Dasie Hamburg M.D.   On: 03/13/2023 18:10   CT cervical spine without contrast Result Date: 03/13/2023 CLINICAL DATA:  Fall.  Increased confusion. EXAM: CT HEAD WITHOUT CONTRAST CT CERVICAL SPINE WITHOUT CONTRAST TECHNIQUE: Multidetector CT imaging of the head and cervical spine was performed following the standard protocol without intravenous contrast. Multiplanar CT image reconstructions of the cervical spine were also generated. RADIATION DOSE REDUCTION: This exam was performed according to the departmental dose-optimization program which includes automated exposure control, adjustment of the mA and/or kV according to patient size and/or use of iterative reconstruction technique. COMPARISON:  CT head and cervical spine 01/26/2023 FINDINGS: CT HEAD FINDINGS Brain: There is no evidence of an acute infarct, intracranial hemorrhage, mass, midline shift, or extra-axial fluid collection. Patchy cerebral white matter hypodensities are unchanged and nonspecific but compatible with moderate chronic small vessel ischemic disease. There is mild cerebral atrophy. Vascular: Calcified atherosclerosis at the skull base. No hyperdense vessel. Skull: No acute fracture or suspicious osseous lesion. Sinuses/Orbits: Unchanged posterior left ethmoid air cell opacification. Clear mastoid air cells. Bilateral cataract extraction. Other: None. CT CERVICAL SPINE FINDINGS Alignment: Unchanged grade 1 retrolisthesis of C3 on C4 and grade 1 anterolisthesis of C4 on C5 in C5 on C6, likely degenerative. Skull base and vertebrae: No acute  fracture or suspicious osseous lesion. Unchanged T1 and T3 superior endplate Schmorl's nodes. Soft tissues and spinal canal: No prevertebral fluid or swelling. No visible canal hematoma. Disc levels: Unchanged appearance of multilevel disc degeneration, particularly advanced at C6-7. Advanced multilevel facet arthrosis. No evidence of high-grade spinal canal stenosis. Upper chest: Chronic asymmetric mild mosaic attenuation in the right lung apex. Unchanged 4 mm nodule in the left upper lobe (series 3, image 85). Other: Unchanged 1.3 cm right thyroid  nodule for which no follow-up imaging is recommended. Mild atherosclerotic calcification at the carotid bifurcations.   1. No evidence of acute intracranial abnormality or acute cervical spine fracture. 2. Moderate chronic small vessel ischemic disease. 3. 4 mm left upper lobe pulmonary nodule. Multiple lung nodules were described on a chest CT from 01/13/2022 whose images are not currently available. A recommendation was made at that time for a follow-up chest CT in 1 year. If further evaluation is clinically warranted considering patient's age and comorbidities, a nonemergent chest CT could be performed as an outpatient. Electronically Signed   By: Dasie Hamburg M.D.   On: 03/13/2023 18:10   XR Chest Portable Result Date: 03/13/2023 CLINICAL DATA:  Altered mental status EXAM: PORTABLE CHEST 1 VIEW COMPARISON:  X-ray 01/13/2022 and CT scan FINDINGS: No consolidation, pneumothorax or effusion. Stable cardiopericardial silhouette. Calcified aorta. Chronic lung changes. Healed right-sided old rib fractures. Degenerative changes of the shoulders. Elevated left humeral head as well. Please correlate for evidence of a full-thickness rotator cuff tear.   Chronic changes.  No acute cardiopulmonary disease. Electronically Signed   By: Ranell Bring M.D.   On: 03/13/2023 18:07    Savannah Houston, MD Hospitalist, [facility] 03/25/23, 12:21 PM

## 2023-03-26 ENCOUNTER — Ambulatory Visit: Payer: Self-pay | Admitting: *Deleted

## 2023-03-26 NOTE — Patient Outreach (Signed)
  Care Coordination   03/26/2023 Name: Amanda Palmer MRN: [id number] DOB: 06/25/1933   Care Coordination Outreach Attempts:  An unsuccessful outreach was attempted for an appointment today.  Follow Up Plan:  Additional outreach attempts will be made to offer the patient complex care management information and services.   Encounter Outcome:  No Answer   Care Coordination Interventions:  Yes, provided    Saliah Crisp L. Mcarthur Speedy, RN, BSN, [facility]  VBCI Care Management Coordinator  [phone]  Fax: [phone]

## 2023-03-27 DIAGNOSIS — N179 Acute kidney failure, unspecified: Secondary | ICD-10-CM | POA: Diagnosis not present

## 2023-03-27 DIAGNOSIS — I700 Atherosclerosis of aorta: Secondary | ICD-10-CM | POA: Diagnosis not present

## 2023-03-27 DIAGNOSIS — K449 Diaphragmatic hernia without obstruction or gangrene: Secondary | ICD-10-CM | POA: Diagnosis not present

## 2023-03-27 DIAGNOSIS — R7401 Elevation of levels of liver transaminase levels: Secondary | ICD-10-CM | POA: Diagnosis not present

## 2023-03-27 DIAGNOSIS — R55 Syncope and collapse: Secondary | ICD-10-CM | POA: Diagnosis not present

## 2023-03-27 DIAGNOSIS — K5750 Diverticulosis of both small and large intestine without perforation or abscess without bleeding: Secondary | ICD-10-CM | POA: Diagnosis not present

## 2023-03-27 DIAGNOSIS — R4182 Altered mental status, unspecified: Secondary | ICD-10-CM | POA: Diagnosis not present

## 2023-03-27 DIAGNOSIS — R0600 Dyspnea, unspecified: Secondary | ICD-10-CM | POA: Diagnosis not present

## 2023-03-27 DIAGNOSIS — R109 Unspecified abdominal pain: Secondary | ICD-10-CM | POA: Diagnosis not present

## 2023-03-27 DIAGNOSIS — I10 Essential (primary) hypertension: Secondary | ICD-10-CM | POA: Diagnosis not present

## 2023-03-27 DIAGNOSIS — I672 Cerebral atherosclerosis: Secondary | ICD-10-CM | POA: Diagnosis not present

## 2023-03-27 DIAGNOSIS — R531 Weakness: Secondary | ICD-10-CM | POA: Diagnosis not present

## 2023-03-27 DIAGNOSIS — J449 Chronic obstructive pulmonary disease, unspecified: Secondary | ICD-10-CM | POA: Diagnosis not present

## 2023-03-27 DIAGNOSIS — I959 Hypotension, unspecified: Secondary | ICD-10-CM | POA: Diagnosis not present

## 2023-03-27 DIAGNOSIS — Z79899 Other long term (current) drug therapy: Secondary | ICD-10-CM | POA: Diagnosis not present

## 2023-03-27 DIAGNOSIS — R0902 Hypoxemia: Secondary | ICD-10-CM | POA: Diagnosis not present

## 2023-03-27 DIAGNOSIS — J1089 Influenza due to other identified influenza virus with other manifestations: Secondary | ICD-10-CM | POA: Diagnosis not present

## 2023-03-27 DIAGNOSIS — Z20822 Contact with and (suspected) exposure to covid-19: Secondary | ICD-10-CM | POA: Diagnosis not present

## 2023-03-27 DIAGNOSIS — Z743 Need for continuous supervision: Secondary | ICD-10-CM | POA: Diagnosis not present

## 2023-03-28 DIAGNOSIS — Z7951 Long term (current) use of inhaled steroids: Secondary | ICD-10-CM | POA: Diagnosis not present

## 2023-03-28 DIAGNOSIS — R944 Abnormal results of kidney function studies: Secondary | ICD-10-CM | POA: Diagnosis not present

## 2023-03-28 DIAGNOSIS — R42 Dizziness and giddiness: Secondary | ICD-10-CM | POA: Diagnosis not present

## 2023-03-28 DIAGNOSIS — I10 Essential (primary) hypertension: Secondary | ICD-10-CM | POA: Diagnosis not present

## 2023-03-28 DIAGNOSIS — J449 Chronic obstructive pulmonary disease, unspecified: Secondary | ICD-10-CM | POA: Diagnosis not present

## 2023-03-28 DIAGNOSIS — R7401 Elevation of levels of liver transaminase levels: Secondary | ICD-10-CM | POA: Diagnosis not present

## 2023-03-28 DIAGNOSIS — Z79899 Other long term (current) drug therapy: Secondary | ICD-10-CM | POA: Diagnosis not present

## 2023-03-28 DIAGNOSIS — R4182 Altered mental status, unspecified: Secondary | ICD-10-CM | POA: Diagnosis not present

## 2023-03-29 DIAGNOSIS — J449 Chronic obstructive pulmonary disease, unspecified: Secondary | ICD-10-CM | POA: Diagnosis not present

## 2023-03-29 DIAGNOSIS — R4182 Altered mental status, unspecified: Secondary | ICD-10-CM | POA: Diagnosis not present

## 2023-03-29 DIAGNOSIS — I10 Essential (primary) hypertension: Secondary | ICD-10-CM | POA: Diagnosis not present

## 2023-03-29 DIAGNOSIS — R944 Abnormal results of kidney function studies: Secondary | ICD-10-CM | POA: Diagnosis not present

## 2023-03-29 DIAGNOSIS — R7401 Elevation of levels of liver transaminase levels: Secondary | ICD-10-CM | POA: Diagnosis not present

## 2023-03-29 DIAGNOSIS — Z79899 Other long term (current) drug therapy: Secondary | ICD-10-CM | POA: Diagnosis not present

## 2023-03-29 DIAGNOSIS — Z7951 Long term (current) use of inhaled steroids: Secondary | ICD-10-CM | POA: Diagnosis not present

## 2023-03-30 DIAGNOSIS — R7401 Elevation of levels of liver transaminase levels: Secondary | ICD-10-CM | POA: Diagnosis not present

## 2023-03-30 DIAGNOSIS — Z7951 Long term (current) use of inhaled steroids: Secondary | ICD-10-CM | POA: Diagnosis not present

## 2023-03-30 DIAGNOSIS — R410 Disorientation, unspecified: Secondary | ICD-10-CM | POA: Diagnosis not present

## 2023-03-30 DIAGNOSIS — I10 Essential (primary) hypertension: Secondary | ICD-10-CM | POA: Diagnosis not present

## 2023-03-30 DIAGNOSIS — J449 Chronic obstructive pulmonary disease, unspecified: Secondary | ICD-10-CM | POA: Diagnosis not present

## 2023-03-30 DIAGNOSIS — R4182 Altered mental status, unspecified: Secondary | ICD-10-CM | POA: Diagnosis not present

## 2023-03-30 DIAGNOSIS — R944 Abnormal results of kidney function studies: Secondary | ICD-10-CM | POA: Diagnosis not present

## 2023-03-30 DIAGNOSIS — Z79899 Other long term (current) drug therapy: Secondary | ICD-10-CM | POA: Diagnosis not present

## 2023-03-31 DIAGNOSIS — R7401 Elevation of levels of liver transaminase levels: Secondary | ICD-10-CM | POA: Diagnosis not present

## 2023-03-31 DIAGNOSIS — R4182 Altered mental status, unspecified: Secondary | ICD-10-CM | POA: Diagnosis not present

## 2023-03-31 DIAGNOSIS — J449 Chronic obstructive pulmonary disease, unspecified: Secondary | ICD-10-CM | POA: Diagnosis not present

## 2023-03-31 DIAGNOSIS — Z79899 Other long term (current) drug therapy: Secondary | ICD-10-CM | POA: Diagnosis not present

## 2023-03-31 DIAGNOSIS — I10 Essential (primary) hypertension: Secondary | ICD-10-CM | POA: Diagnosis not present

## 2023-03-31 DIAGNOSIS — R944 Abnormal results of kidney function studies: Secondary | ICD-10-CM | POA: Diagnosis not present

## 2023-03-31 DIAGNOSIS — Z7951 Long term (current) use of inhaled steroids: Secondary | ICD-10-CM | POA: Diagnosis not present

## 2023-04-01 ENCOUNTER — Telehealth: Payer: Self-pay | Admitting: *Deleted

## 2023-04-01 DIAGNOSIS — R4182 Altered mental status, unspecified: Secondary | ICD-10-CM | POA: Diagnosis not present

## 2023-04-01 DIAGNOSIS — R7401 Elevation of levels of liver transaminase levels: Secondary | ICD-10-CM | POA: Diagnosis not present

## 2023-04-01 DIAGNOSIS — B9620 Unspecified Escherichia coli [E. coli] as the cause of diseases classified elsewhere: Secondary | ICD-10-CM | POA: Diagnosis not present

## 2023-04-01 DIAGNOSIS — J101 Influenza due to other identified influenza virus with other respiratory manifestations: Secondary | ICD-10-CM | POA: Diagnosis not present

## 2023-04-01 DIAGNOSIS — Z79899 Other long term (current) drug therapy: Secondary | ICD-10-CM | POA: Diagnosis not present

## 2023-04-01 DIAGNOSIS — I10 Essential (primary) hypertension: Secondary | ICD-10-CM | POA: Diagnosis not present

## 2023-04-01 DIAGNOSIS — Z792 Long term (current) use of antibiotics: Secondary | ICD-10-CM | POA: Diagnosis not present

## 2023-04-01 DIAGNOSIS — J449 Chronic obstructive pulmonary disease, unspecified: Secondary | ICD-10-CM | POA: Diagnosis not present

## 2023-04-01 DIAGNOSIS — N179 Acute kidney failure, unspecified: Secondary | ICD-10-CM | POA: Diagnosis not present

## 2023-04-01 NOTE — Progress Notes (Signed)
Complex Care Management Care Guide Note  04/01/2023 Name: JULEAH BRADBY MRN: [id number] DOB: 03-20-1933  COLLETT JAY is a 89 y.o. year old female who is a primary care patient of Benita Stabile, MD and is actively engaged with the care management team. I reached out to Alphonsus Sias by phone today to assist with re-scheduling  with the RN Case Manager.  Follow up plan: Unsuccessful telephone outreach attempt made. A HIPAA compliant phone message was left for the patient providing contact information and requesting a return call.  Gwenevere Ghazi  [facility]  Value-Based Care Institute, [facility] Guide  Direct Dial: [phone]  Fax [phone]

## 2023-04-02 DIAGNOSIS — I10 Essential (primary) hypertension: Secondary | ICD-10-CM | POA: Diagnosis not present

## 2023-04-02 DIAGNOSIS — N179 Acute kidney failure, unspecified: Secondary | ICD-10-CM | POA: Diagnosis not present

## 2023-04-02 DIAGNOSIS — R7401 Elevation of levels of liver transaminase levels: Secondary | ICD-10-CM | POA: Diagnosis not present

## 2023-04-02 DIAGNOSIS — J449 Chronic obstructive pulmonary disease, unspecified: Secondary | ICD-10-CM | POA: Diagnosis not present

## 2023-04-02 DIAGNOSIS — B9620 Unspecified Escherichia coli [E. coli] as the cause of diseases classified elsewhere: Secondary | ICD-10-CM | POA: Diagnosis not present

## 2023-04-02 DIAGNOSIS — J101 Influenza due to other identified influenza virus with other respiratory manifestations: Secondary | ICD-10-CM | POA: Diagnosis not present

## 2023-04-02 DIAGNOSIS — Z79899 Other long term (current) drug therapy: Secondary | ICD-10-CM | POA: Diagnosis not present

## 2023-04-02 DIAGNOSIS — R4182 Altered mental status, unspecified: Secondary | ICD-10-CM | POA: Diagnosis not present

## 2023-04-02 DIAGNOSIS — Z792 Long term (current) use of antibiotics: Secondary | ICD-10-CM | POA: Diagnosis not present

## 2023-04-03 DIAGNOSIS — R110 Nausea: Secondary | ICD-10-CM | POA: Insufficient documentation

## 2023-04-03 DIAGNOSIS — J09X9 Influenza due to identified novel influenza A virus with other manifestations: Secondary | ICD-10-CM | POA: Diagnosis not present

## 2023-04-03 DIAGNOSIS — B349 Viral infection, unspecified: Secondary | ICD-10-CM | POA: Diagnosis not present

## 2023-04-03 DIAGNOSIS — J101 Influenza due to other identified influenza virus with other respiratory manifestations: Secondary | ICD-10-CM | POA: Diagnosis not present

## 2023-04-03 DIAGNOSIS — Z789 Other specified health status: Secondary | ICD-10-CM | POA: Insufficient documentation

## 2023-04-03 DIAGNOSIS — E860 Dehydration: Secondary | ICD-10-CM | POA: Diagnosis not present

## 2023-04-03 DIAGNOSIS — N3000 Acute cystitis without hematuria: Secondary | ICD-10-CM | POA: Insufficient documentation

## 2023-04-03 DIAGNOSIS — Z79899 Other long term (current) drug therapy: Secondary | ICD-10-CM | POA: Diagnosis not present

## 2023-04-03 DIAGNOSIS — N390 Urinary tract infection, site not specified: Secondary | ICD-10-CM | POA: Diagnosis not present

## 2023-04-04 ENCOUNTER — Encounter: Payer: Medicare HMO | Admitting: Orthopedic Surgery

## 2023-04-08 ENCOUNTER — Ambulatory Visit: Payer: Medicare HMO | Admitting: Orthopedic Surgery

## 2023-04-08 ENCOUNTER — Other Ambulatory Visit (INDEPENDENT_AMBULATORY_CARE_PROVIDER_SITE_OTHER): Payer: Self-pay

## 2023-04-08 ENCOUNTER — Encounter: Payer: Self-pay | Admitting: Orthopedic Surgery

## 2023-04-08 ENCOUNTER — Ambulatory Visit: Payer: Self-pay | Admitting: *Deleted

## 2023-04-08 DIAGNOSIS — S52515D Nondisplaced fracture of left radial styloid process, subsequent encounter for closed fracture with routine healing: Secondary | ICD-10-CM

## 2023-04-08 DIAGNOSIS — S62636D Displaced fracture of distal phalanx of right little finger, subsequent encounter for fracture with routine healing: Secondary | ICD-10-CM | POA: Diagnosis not present

## 2023-04-08 DIAGNOSIS — M20019 Mallet finger of unspecified finger(s): Secondary | ICD-10-CM

## 2023-04-08 DIAGNOSIS — S62639D Displaced fracture of distal phalanx of unspecified finger, subsequent encounter for fracture with routine healing: Secondary | ICD-10-CM

## 2023-04-08 DIAGNOSIS — M20011 Mallet finger of right finger(s): Secondary | ICD-10-CM

## 2023-04-08 NOTE — Patient Instructions (Signed)
Visit Information  Thank you for taking time to visit with me today. Please don't hesitate to contact me if I can be of assistance to you.   Following are the goals we discussed today:   Goals Addressed             This Visit's Progress    manage health after SNF discharge to include home health, medicines, hearing, medications -RN CM services       Interventions Today    Flowsheet Row Most Recent Value  Chronic Disease   Chronic disease during today's visit Other  [Remain in snf or not?/collaboration with [facility] SNF SW LVm]  General Interventions   General Interventions Discussed/Reviewed General Interventions Reviewed, Communication with  Communication with PCP/Specialists  [confirmd with staff at [facility] patient remains there no pending discharge date- Left message for snf SW to call RN CM or VBCI CMA Stacey with update to assist in determining if patient continues to need to be followed by VBCI RN CM]              Our next appointment is by telephone Pend snf discharge  on pending at pending  Please call the care guide team at [phone] if you need to cancel or reschedule your appointment.   If you are experiencing a Mental Health or Behavioral Health Crisis or need someone to talk to, please call the Suicide and Crisis Lifeline: 988 call the [place] National Suicide Prevention Lifeline: (210)729-4405 or TTY: 301-377-1998 TTY 5065051738) to talk to a trained counselor call 1-800-273-TALK (toll free, 24 hour hotline) call the [facility]: 731-152-8508 call 911   Patient verbalizes understanding of instructions and care plan provided today and agrees to view in MyChart. Active MyChart status and patient understanding of how to access instructions and care plan via MyChart confirmed with patient.     The patient has been provided with contact information for the care management team and has been advised to call with any health related questions  or concerns.  Next AWV (Annual Wellness Visit) scheduled for:   Cala Bradford L. Noelle Penner, RN, BSN, CCM [place]  Value Based Care Institute, [facility] Health RN Care Manager Direct Dial: [phone]  Fax: [phone] Mailing Address: [address]  [place] [address] Website: [email or web address]

## 2023-04-08 NOTE — Patient Outreach (Signed)
  Care Coordination   Collaboration with VBCI CMA SNF staff  Visit Note   04/08/2023 Name: Amanda Palmer MRN: [id number] DOB: Oct 16, 1933  Amanda Palmer is a 89 y.o. year old female who sees Margo Aye, Kathleene Hazel, MD for primary care. I  received a message via chat inquiring of patient possible discharge from facility   What matters to the patients health and wellness today?  Whether patient will remain in snf or discharge home to continues VBCI CM services   Goals Addressed             This Visit's Progress    manage health after SNF discharge to include home health, medicines, hearing, medications -RN CM services       Interventions Today    Flowsheet Row Most Recent Value  Chronic Disease   Chronic disease during today's visit Other  [Remain in snf or not?/collaboration with [facility] SNF SW LVm]  General Interventions   General Interventions Discussed/Reviewed General Interventions Reviewed, Communication with  Communication with PCP/Specialists  [confirmd with staff at [facility] patient remains there no pending discharge date- Left message for snf SW to call RN CM or VBCI CMA Stacey with update to assist in determining if patient continues to need to be followed by VBCI RN CM]              SDOH assessments and interventions completed:  No     Care Coordination Interventions:  Yes, provided   Follow up plan: Follow up call scheduled for ? Pending possible snf discharge    Encounter Outcome:  Patient Visit Completed   Cala Bradford L. Noelle Penner, RN, BSN, CCM [place]  Value Based Care Institute, [facility] Health RN Care Manager Direct Dial: [phone]  Fax: [phone] Mailing Address: [address]  [place] [address] Website: [email or web address]

## 2023-04-08 NOTE — Progress Notes (Signed)
Orthopaedic Clinic Return  Assessment: Amanda Palmer is a 89 y.o. female with the following: Left nondisplaced radial styloid fracture Right small finger mallet fracture  Plan: Amanda Palmer returned to clinic for repeat evaluation of the right small finger and left wrist.  Overall, she is much better.  Minimal discomfort in the left wrist.  She has not been bracing the wrist.  Small finger, with slight extension lag at the DIP, with some residual swelling.  It is bit physical continue to get better.  Nothing further is needed at this time.  Gradual return of function.  Follow-up as needed.   Follow-up: Return if symptoms worsen or fail to improve.   Subjective:  Chief Complaint  Patient presents with   Follow-up    Recheck on left wrist and right small finger, DOI 01-06-23.    History of Present Illness: Amanda Palmer is a 89 y.o. female who returns to clinic for evaluation of left wrist pain and right small finger fracture.  No issues with the left wrist.  She has continued to wear a splint on her small finger.  Pain is better.  She has no complaints.  She is looking forward to removing the splint.  Since I last saw her, she was admitted to the hospital, and has since discharged to a nursing facility.  She may be discharged from the nursing facility soon.  Review of Systems: No fevers or chills No numbness or tingling No chest pain No shortness of breath No bowel or bladder dysfunction No GI distress No headaches   Objective: There were no vitals taken for this visit.  Physical Exam:  Elderly female.  No acute distress.    Use a walker to assist with ambulation.  Evaluation left wrist demonstrates no swelling.  No bruising.  Mild tenderness to palpation of the radial styloid.  She has good range of motion of the wrist, as well as the hand.  Right hand with some mild swelling and redness over the DIP joint.  Mild tenderness to palpation.  Approximately 10-15 degree  extensor lag at the DIP joint.  Fingers are warm well-perfused.  IMAGING: I personally ordered and reviewed the following images:  X-rays left wrist were obtained in clinic today.  These are compared to available x-rays.  Radial styloid fracture remains in same alignment.  No obvious callus formation.  No displacement.  No bony lesions.  Impression: Stable left radial styloid fracture.  X-rays of the right small finger were obtained in clinic today.  No change in overall alignment.  Small dorsally based avulsion fracture remains in stable alignment.  Slight extensor lag based on available imaging.  No callus formation.  No new injuries.  No bony lesions.  Impression: Right small finger with minimally displaced bony mallet fracture    Oliver Barre, MD 04/08/2023 1:44 PM

## 2023-04-09 DIAGNOSIS — J449 Chronic obstructive pulmonary disease, unspecified: Secondary | ICD-10-CM | POA: Diagnosis not present

## 2023-04-12 DIAGNOSIS — L309 Dermatitis, unspecified: Secondary | ICD-10-CM | POA: Diagnosis not present

## 2023-04-12 DIAGNOSIS — E785 Hyperlipidemia, unspecified: Secondary | ICD-10-CM | POA: Diagnosis not present

## 2023-04-12 DIAGNOSIS — N390 Urinary tract infection, site not specified: Secondary | ICD-10-CM | POA: Diagnosis not present

## 2023-04-12 DIAGNOSIS — G609 Hereditary and idiopathic neuropathy, unspecified: Secondary | ICD-10-CM | POA: Diagnosis not present

## 2023-04-12 DIAGNOSIS — Z9181 History of falling: Secondary | ICD-10-CM | POA: Diagnosis not present

## 2023-04-12 DIAGNOSIS — M62561 Muscle wasting and atrophy, not elsewhere classified, right lower leg: Secondary | ICD-10-CM | POA: Diagnosis not present

## 2023-04-12 DIAGNOSIS — I4891 Unspecified atrial fibrillation: Secondary | ICD-10-CM | POA: Diagnosis not present

## 2023-04-12 DIAGNOSIS — B349 Viral infection, unspecified: Secondary | ICD-10-CM | POA: Diagnosis not present

## 2023-04-12 DIAGNOSIS — J09X9 Influenza due to identified novel influenza A virus with other manifestations: Secondary | ICD-10-CM | POA: Diagnosis not present

## 2023-04-12 DIAGNOSIS — K5909 Other constipation: Secondary | ICD-10-CM | POA: Diagnosis not present

## 2023-04-12 DIAGNOSIS — R110 Nausea: Secondary | ICD-10-CM | POA: Diagnosis not present

## 2023-04-12 DIAGNOSIS — F32A Depression, unspecified: Secondary | ICD-10-CM | POA: Diagnosis not present

## 2023-04-12 DIAGNOSIS — R531 Weakness: Secondary | ICD-10-CM | POA: Diagnosis not present

## 2023-04-12 DIAGNOSIS — E860 Dehydration: Secondary | ICD-10-CM | POA: Diagnosis not present

## 2023-04-12 DIAGNOSIS — M1990 Unspecified osteoarthritis, unspecified site: Secondary | ICD-10-CM | POA: Diagnosis not present

## 2023-04-12 DIAGNOSIS — K219 Gastro-esophageal reflux disease without esophagitis: Secondary | ICD-10-CM | POA: Diagnosis not present

## 2023-04-12 DIAGNOSIS — F419 Anxiety disorder, unspecified: Secondary | ICD-10-CM | POA: Diagnosis not present

## 2023-04-12 DIAGNOSIS — B029 Zoster without complications: Secondary | ICD-10-CM | POA: Diagnosis not present

## 2023-04-12 DIAGNOSIS — J101 Influenza due to other identified influenza virus with other respiratory manifestations: Secondary | ICD-10-CM | POA: Diagnosis not present

## 2023-04-12 DIAGNOSIS — E611 Iron deficiency: Secondary | ICD-10-CM | POA: Diagnosis not present

## 2023-04-12 DIAGNOSIS — R4182 Altered mental status, unspecified: Secondary | ICD-10-CM | POA: Diagnosis not present

## 2023-04-12 DIAGNOSIS — E569 Vitamin deficiency, unspecified: Secondary | ICD-10-CM | POA: Diagnosis not present

## 2023-04-12 DIAGNOSIS — F0390 Unspecified dementia without behavioral disturbance: Secondary | ICD-10-CM | POA: Diagnosis not present

## 2023-04-12 DIAGNOSIS — I679 Cerebrovascular disease, unspecified: Secondary | ICD-10-CM | POA: Diagnosis not present

## 2023-04-12 DIAGNOSIS — J449 Chronic obstructive pulmonary disease, unspecified: Secondary | ICD-10-CM | POA: Diagnosis not present

## 2023-04-12 DIAGNOSIS — I10 Essential (primary) hypertension: Secondary | ICD-10-CM | POA: Diagnosis not present

## 2023-04-15 NOTE — LTC Provider Review (Signed)
   SPEECH THERAPY TREATMENT NOTE    Patient Name:  Amanda Palmer      Medical Record Number: [id number]  Date of Birth: 03/28/1933 Location: [facility] [place]   PT Precautions: Falls,   OT Precautions: Falls,   SLP Precautions: Falls,    Subjective: Pt is engaged and cooperative in tx  Cognitive Communication Treatment: Pt was seen for skilled ST services targeting cognition. Pt seen upright in WC. Pt participated in thought organization task re: mimicking patterns with 95% acc with mod verbal and visual cues. ST increased complexity of task re: increasing difficulty of patterns, resulting in increase of cues.    I attest that I have reviewed the above information. Signed: Isaiah Maus, SLP 04/15/2023

## 2023-04-16 NOTE — LTC Provider Review (Signed)
   SPEECH THERAPY TREATMENT NOTE    Patient Name:  Amanda Palmer      Medical Record Number: [id number]  Date of Birth: March 28, 1933 Location: [facility] [place]   PT Precautions: Falls,   OT Precautions: Falls,   SLP Precautions: Falls,    Subjective: Pt was engaged and cooperative in tx  Cognitive Communication Treatment: Pt seen for skilled ST services targeting cognition. Pt seen upright in WC. Pt participated in reasoning task with 90% acc with mod verbal cues. Pt completed generative naming task with 95% acc with mod verbal cues. Pt demonstrated ability to self-correct mistakes when she named objects from past categories.    I attest that I have reviewed the above information. Signed: Isaiah Maus, SLP 04/16/2023

## 2023-04-17 NOTE — LTC Provider Review (Signed)
 SPEECH THERAPY DISCHARGE SUMMARY Actual Discharge Date: 04/17/23   Patient Name:  Amanda Palmer      Medical Record Number: [id number]  Date of Birth: 1933/07/21  Sex: Female       Location: [facility] Physician: Orpha Haley  Missed Treatments: No   ASSESSMENT  Analysis of Functional Outcome/Clinical Impression: Pt is engaged and cooperative with skilled ST intervention. Pt has exhibited improvement in cognitive fxn, specifically moderate level thought organization, use of recall strategies and supplements, and exec fxn skills   Patient/ Caregiver Training: ST has educated pt on recall strategies and supplements, problem solving strategies in order achieve goal to achieve a safe and favorable dc.    Skilled Services Provided since start of care: ST has assessed and treated cognition. ST has developed and implemented tasks targeting cognitive deficits. ST has provided ongoing assessment of pts performance. ST has educated pt on: POC, progress, recall strategies and supplements,and problem solving strategies.     PLAN Patient to Discharge: to Assisted Living Facility  Recommendations: No further ST services indicated at this time  SUBJECTIVE: Pt was engaged and cooperative with POC  OBJECTIVE  Cognition: Severity of Cognitive-Communication Impairment: mild-moderate Primary Deficit Areas: short-term recall, organizational thought, safety awareness, problem solving, executive functioning, reasoning Complicating Factors: pain, hearing loss Insight into Deficits: intact   Attention: Mild Immediate Recall: Mild Short Term Recall: Moderate Safety/Judgement: Mild Problem Solving: Mild Organizational Thought: Moderate   Cognitive Pattern Assessment Used: BIMS  Repetition of Three Words (First Attempt): 3 Temporal Orientation: Year: Correct Temporal Orientation: Month: Accurate within 5 days Temporal Orientation: Day:  Correct Recall: Sock: Yes, no cue required Recall: Blue: Yes, no cue required Recall: Bed: Yes, no cue required BIMS Summary Score: 15       GOALS Short Term Goals: Short Term Goal 1: Patient will perform functional thought organization tasks with 60% accuracy in order to facilitate safety within current environment, increase safety during daily living tasks, and return to PLOF.  Date Established : 04/04/23 Time Frame : 2 weeks  Plan : Met, Goal Discontinued   Short Term Goal 2: Patient will recall new information, up to 5 elements with 60% accuracy in order to faciliate decision making skills for care/needs; faciliate safe return to PLOF; increase safety during daily living tasks; and safely participate with functional activities.  Date Established : 04/04/23 Time Frame : 2 weeks Plan : Met, Goal Discontinued   Short Term Goal 3: Patient will exhibit concrete reasoning within 55% of opportunities in order to facilitate safe return to prior level of living, promote independence, safety participate with activities of choice, facilitate saftey within current environment.  Date Established : 04/04/23 Time Frame : 2 weeks  Plan : Met, Goal Discontinued   Short Term Goal 4: Pt will sequence 3-4 step familiar ADL tasks with 60% accuracy in order to facilitate safe return to prior level of living, promote independence, safety participate with activities of choice and ADLs, and facilitate safety within current environment  Date Established : 04/04/23 Time Frame : 2 weeks  Plan : Met, Goal Discontinued      Long Term Goals: Long Term Goal #1: Patient will increase ability to function safely without additional assistance/ supervision due to cognitive deficits 85-95% of the time in order to demonstrate improved problem solving and cognition skills.  Date Established : 04/04/23 Time Frame : 30 Days  Plan : Goal Discontinued, Not Met   Long Term Goal #  2: Patient will demonstrate  adequate cognitive-communicative skills as evidenced by ability to follow a sequence to complete age-appropriate routine common daily living tasks 85-95% of the time.  Date Established : 03/27/23 Time Frame : 30 Days      DAILY TREATMENT NOTE  Cognitive Communication Treatment: Pt seen for skilled ST services to address cognitive linguistic deficits. Pt goals were assessed on this date to determine current level of function. Pt has made consistent progress with POC. Pt last tx day and discharge summary also completed on this date. Please refer to note for additional information re: pt goals and progress with skilled interventions.    I attest that I have reviewed the above information. Signed: Isaiah Maus, SLP [place] 04/17/2023

## 2023-04-21 DIAGNOSIS — J09X9 Influenza due to identified novel influenza A virus with other manifestations: Secondary | ICD-10-CM | POA: Diagnosis not present

## 2023-04-21 DIAGNOSIS — E860 Dehydration: Secondary | ICD-10-CM | POA: Diagnosis not present

## 2023-04-21 DIAGNOSIS — N390 Urinary tract infection, site not specified: Secondary | ICD-10-CM | POA: Diagnosis not present

## 2023-04-22 DIAGNOSIS — F0394 Unspecified dementia, unspecified severity, with anxiety: Secondary | ICD-10-CM | POA: Diagnosis not present

## 2023-04-22 DIAGNOSIS — F0393 Unspecified dementia, unspecified severity, with mood disturbance: Secondary | ICD-10-CM | POA: Diagnosis not present

## 2023-04-22 DIAGNOSIS — I5030 Unspecified diastolic (congestive) heart failure: Secondary | ICD-10-CM | POA: Diagnosis not present

## 2023-04-22 DIAGNOSIS — F32A Depression, unspecified: Secondary | ICD-10-CM | POA: Diagnosis not present

## 2023-04-22 DIAGNOSIS — Z7901 Long term (current) use of anticoagulants: Secondary | ICD-10-CM | POA: Diagnosis not present

## 2023-04-22 DIAGNOSIS — I110 Hypertensive heart disease with heart failure: Secondary | ICD-10-CM | POA: Diagnosis not present

## 2023-04-22 DIAGNOSIS — Z9181 History of falling: Secondary | ICD-10-CM | POA: Diagnosis not present

## 2023-04-22 DIAGNOSIS — J449 Chronic obstructive pulmonary disease, unspecified: Secondary | ICD-10-CM | POA: Diagnosis not present

## 2023-04-22 DIAGNOSIS — Z9981 Dependence on supplemental oxygen: Secondary | ICD-10-CM | POA: Diagnosis not present

## 2023-04-22 DIAGNOSIS — I4821 Permanent atrial fibrillation: Secondary | ICD-10-CM | POA: Diagnosis not present

## 2023-04-22 DIAGNOSIS — Z87891 Personal history of nicotine dependence: Secondary | ICD-10-CM | POA: Diagnosis not present

## 2023-04-24 DIAGNOSIS — Z87891 Personal history of nicotine dependence: Secondary | ICD-10-CM | POA: Diagnosis not present

## 2023-04-24 DIAGNOSIS — I110 Hypertensive heart disease with heart failure: Secondary | ICD-10-CM | POA: Diagnosis not present

## 2023-04-24 DIAGNOSIS — I5030 Unspecified diastolic (congestive) heart failure: Secondary | ICD-10-CM | POA: Diagnosis not present

## 2023-04-24 DIAGNOSIS — Z7901 Long term (current) use of anticoagulants: Secondary | ICD-10-CM | POA: Diagnosis not present

## 2023-04-24 DIAGNOSIS — Z9981 Dependence on supplemental oxygen: Secondary | ICD-10-CM | POA: Diagnosis not present

## 2023-04-24 DIAGNOSIS — F0394 Unspecified dementia, unspecified severity, with anxiety: Secondary | ICD-10-CM | POA: Diagnosis not present

## 2023-04-24 DIAGNOSIS — F32A Depression, unspecified: Secondary | ICD-10-CM | POA: Diagnosis not present

## 2023-04-24 DIAGNOSIS — Z9181 History of falling: Secondary | ICD-10-CM | POA: Diagnosis not present

## 2023-04-24 DIAGNOSIS — F0393 Unspecified dementia, unspecified severity, with mood disturbance: Secondary | ICD-10-CM | POA: Diagnosis not present

## 2023-04-24 DIAGNOSIS — I4821 Permanent atrial fibrillation: Secondary | ICD-10-CM | POA: Diagnosis not present

## 2023-04-24 DIAGNOSIS — J449 Chronic obstructive pulmonary disease, unspecified: Secondary | ICD-10-CM | POA: Diagnosis not present

## 2023-04-28 DIAGNOSIS — Z7901 Long term (current) use of anticoagulants: Secondary | ICD-10-CM | POA: Diagnosis not present

## 2023-04-28 DIAGNOSIS — Z9181 History of falling: Secondary | ICD-10-CM | POA: Diagnosis not present

## 2023-04-28 DIAGNOSIS — F0394 Unspecified dementia, unspecified severity, with anxiety: Secondary | ICD-10-CM | POA: Diagnosis not present

## 2023-04-28 DIAGNOSIS — I4821 Permanent atrial fibrillation: Secondary | ICD-10-CM | POA: Diagnosis not present

## 2023-04-28 DIAGNOSIS — J449 Chronic obstructive pulmonary disease, unspecified: Secondary | ICD-10-CM | POA: Diagnosis not present

## 2023-04-28 DIAGNOSIS — I110 Hypertensive heart disease with heart failure: Secondary | ICD-10-CM | POA: Diagnosis not present

## 2023-04-28 DIAGNOSIS — F0393 Unspecified dementia, unspecified severity, with mood disturbance: Secondary | ICD-10-CM | POA: Diagnosis not present

## 2023-04-28 DIAGNOSIS — F32A Depression, unspecified: Secondary | ICD-10-CM | POA: Diagnosis not present

## 2023-04-28 DIAGNOSIS — Z9981 Dependence on supplemental oxygen: Secondary | ICD-10-CM | POA: Diagnosis not present

## 2023-04-28 DIAGNOSIS — Z87891 Personal history of nicotine dependence: Secondary | ICD-10-CM | POA: Diagnosis not present

## 2023-04-28 DIAGNOSIS — I5030 Unspecified diastolic (congestive) heart failure: Secondary | ICD-10-CM | POA: Diagnosis not present

## 2023-05-01 DIAGNOSIS — I4821 Permanent atrial fibrillation: Secondary | ICD-10-CM | POA: Diagnosis not present

## 2023-05-01 DIAGNOSIS — Z87891 Personal history of nicotine dependence: Secondary | ICD-10-CM | POA: Diagnosis not present

## 2023-05-01 DIAGNOSIS — Z9981 Dependence on supplemental oxygen: Secondary | ICD-10-CM | POA: Diagnosis not present

## 2023-05-01 DIAGNOSIS — Z9181 History of falling: Secondary | ICD-10-CM | POA: Diagnosis not present

## 2023-05-01 DIAGNOSIS — F0394 Unspecified dementia, unspecified severity, with anxiety: Secondary | ICD-10-CM | POA: Diagnosis not present

## 2023-05-01 DIAGNOSIS — I5030 Unspecified diastolic (congestive) heart failure: Secondary | ICD-10-CM | POA: Diagnosis not present

## 2023-05-01 DIAGNOSIS — Z7901 Long term (current) use of anticoagulants: Secondary | ICD-10-CM | POA: Diagnosis not present

## 2023-05-01 DIAGNOSIS — F0393 Unspecified dementia, unspecified severity, with mood disturbance: Secondary | ICD-10-CM | POA: Diagnosis not present

## 2023-05-01 DIAGNOSIS — J449 Chronic obstructive pulmonary disease, unspecified: Secondary | ICD-10-CM | POA: Diagnosis not present

## 2023-05-01 DIAGNOSIS — F32A Depression, unspecified: Secondary | ICD-10-CM | POA: Diagnosis not present

## 2023-05-01 DIAGNOSIS — I110 Hypertensive heart disease with heart failure: Secondary | ICD-10-CM | POA: Diagnosis not present

## 2023-05-02 ENCOUNTER — Ambulatory Visit: Payer: Self-pay | Admitting: *Deleted

## 2023-05-02 DIAGNOSIS — Z7901 Long term (current) use of anticoagulants: Secondary | ICD-10-CM | POA: Diagnosis not present

## 2023-05-02 DIAGNOSIS — F0394 Unspecified dementia, unspecified severity, with anxiety: Secondary | ICD-10-CM | POA: Diagnosis not present

## 2023-05-02 DIAGNOSIS — J449 Chronic obstructive pulmonary disease, unspecified: Secondary | ICD-10-CM | POA: Diagnosis not present

## 2023-05-02 DIAGNOSIS — I5030 Unspecified diastolic (congestive) heart failure: Secondary | ICD-10-CM | POA: Diagnosis not present

## 2023-05-02 DIAGNOSIS — F0393 Unspecified dementia, unspecified severity, with mood disturbance: Secondary | ICD-10-CM | POA: Diagnosis not present

## 2023-05-02 DIAGNOSIS — F32A Depression, unspecified: Secondary | ICD-10-CM | POA: Diagnosis not present

## 2023-05-02 DIAGNOSIS — Z87891 Personal history of nicotine dependence: Secondary | ICD-10-CM | POA: Diagnosis not present

## 2023-05-02 DIAGNOSIS — I4821 Permanent atrial fibrillation: Secondary | ICD-10-CM | POA: Diagnosis not present

## 2023-05-02 DIAGNOSIS — I110 Hypertensive heart disease with heart failure: Secondary | ICD-10-CM | POA: Diagnosis not present

## 2023-05-02 DIAGNOSIS — Z9181 History of falling: Secondary | ICD-10-CM | POA: Diagnosis not present

## 2023-05-02 DIAGNOSIS — Z9981 Dependence on supplemental oxygen: Secondary | ICD-10-CM | POA: Diagnosis not present

## 2023-05-02 NOTE — Patient Outreach (Signed)
 Care Coordination   Follow Up Visit Note   05/06/2023 Name: Amanda Palmer MRN: [id number] DOB: 04/04/1933  Amanda Palmer is a 89 y.o. year old female who sees Margo Aye, Kathleene Hazel, MD for primary care. I spoke with Amanda Palmer, Niece of  Amanda Palmer by phone today.  What matters to the patients health and wellness today?  Amanda Palmer reports patient not getting medications she previously had for hypertension, swelling at the [facility] assisted living facility   She has an appointment on May 13 2023 1145 with Dr Sabino Niemann for Transition of care services post discharge from [facility] to [facility] facility, skilled Transition of Care will be completed by primary care provider office who will refer to [facility] care management if needed.  Patient now in [facility] assisted living in [place] Patient has not been seen by a MD in months at the facilities  Amanda Palmer reports that it is tasking to get patient to pcp. It is a 2 hour process to and from the provider office  Voiced interest in patient going to an [place] provider      Goals Addressed             This Visit's Progress    manage health at assisted living, medicine reconciliation -RN CM services   On track    Interventions Today    Flowsheet Row Most Recent Value  General Interventions   Doctor Visits Discussed/Reviewed Doctor Visits Reviewed, PCP  [provided a list of in network [employer] pcps in [place] and some MDs that visit in the home for patient (in AVS & via letter)]              SDOH assessments and interventions completed:  No     Care Coordination Interventions:  Yes, provided   Follow up plan: Follow up call scheduled for 05/06/23    Encounter Outcome:  Patient Visit Completed   Cala Bradford L. Noelle Penner, RN, BSN, CCM [place]  Value Based Care Institute, [facility] RN Care Manager Direct Dial: [phone]  Fax: [phone] Mailing Address: [address]  [place] [address] Website:  [email or web address]

## 2023-05-05 DIAGNOSIS — Z7901 Long term (current) use of anticoagulants: Secondary | ICD-10-CM | POA: Diagnosis not present

## 2023-05-05 DIAGNOSIS — F0394 Unspecified dementia, unspecified severity, with anxiety: Secondary | ICD-10-CM | POA: Diagnosis not present

## 2023-05-05 DIAGNOSIS — F0393 Unspecified dementia, unspecified severity, with mood disturbance: Secondary | ICD-10-CM | POA: Diagnosis not present

## 2023-05-05 DIAGNOSIS — F32A Depression, unspecified: Secondary | ICD-10-CM | POA: Diagnosis not present

## 2023-05-05 DIAGNOSIS — Z9181 History of falling: Secondary | ICD-10-CM | POA: Diagnosis not present

## 2023-05-05 DIAGNOSIS — Z9981 Dependence on supplemental oxygen: Secondary | ICD-10-CM | POA: Diagnosis not present

## 2023-05-05 DIAGNOSIS — I110 Hypertensive heart disease with heart failure: Secondary | ICD-10-CM | POA: Diagnosis not present

## 2023-05-05 DIAGNOSIS — I5030 Unspecified diastolic (congestive) heart failure: Secondary | ICD-10-CM | POA: Diagnosis not present

## 2023-05-05 DIAGNOSIS — J449 Chronic obstructive pulmonary disease, unspecified: Secondary | ICD-10-CM | POA: Diagnosis not present

## 2023-05-05 DIAGNOSIS — Z87891 Personal history of nicotine dependence: Secondary | ICD-10-CM | POA: Diagnosis not present

## 2023-05-05 DIAGNOSIS — I4821 Permanent atrial fibrillation: Secondary | ICD-10-CM | POA: Diagnosis not present

## 2023-05-06 ENCOUNTER — Ambulatory Visit: Payer: Self-pay | Admitting: *Deleted

## 2023-05-06 NOTE — Patient Outreach (Addendum)
 Care Coordination   05/06/2023 Name: Amanda Palmer MRN: [id number] DOB: 11/10/1933   Care Coordination Outreach Attempts:  An unsuccessful telephone outreach was attempted today to offer the patient information about available complex care management services.  Follow Up Plan:  Additional outreach attempts will be made to offer the patient complex care management information and services.   Encounter Outcome:  No Answer   Care Coordination Interventions:  Yes, provided left [name] a message informing her of the result of RN  CM and pcp collaboration for medicine reconciliation. Informed that patient's medicines will be addressed during her office visit on next Tuesday  05/13/23   Interventions Today    Flowsheet Row Most Recent Value  Chronic Disease   Chronic disease during today's visit --  [left [name] a message informing her of the result of RN  CM and pcp collaboration for medicine reconciliation. Informed that patient's medicines will be addressed during her office visit on next Tuesday  05/13/23]  General Interventions   General Interventions Discussed/Reviewed General Interventions Reviewed, Communication with, Doctor Visits  Doctor Visits Discussed/Reviewed Doctor Visits Reviewed, PCP  PCP/Specialist Visits Compliance with follow-up visit  Communication with PCP/Specialists, RN        Cala Bradford L. Noelle Penner, RN, BSN, CCM [place]  Value Based Care Institute, [facility] RN Care Manager Direct Dial: [phone]  Fax: [phone] Mailing Address: [address]  [place] [address] Website: [email or web address]

## 2023-05-06 NOTE — Patient Instructions (Addendum)
 Visit Information  Thank you for taking time to visit with me today. Please don't hesitate to contact me if I can be of assistance to you.   Following are the goals we discussed today:   Goals Addressed             This Visit's Progress    manage health at assisted living, medicine reconciliation -RN CM services       Interventions Today    Flowsheet Row Most Recent Value  Chronic Disease   Chronic disease during today's visit --  [left [name] a message informing her of the result of RN  CM and pcp collaboration for medicine reconciliation. Informed that patient's medicines will be addressed during her office visit on next Tuesday  05/13/23]  General Interventions   General Interventions Discussed/Reviewed General Interventions Reviewed, Communication with, Doctor Visits  Doctor Visits Discussed/Reviewed Doctor Visits Reviewed, PCP  PCP/Specialist Visits Compliance with follow-up visit  Communication with PCP/Specialists, RN              Our next appointment is by telephone on 05/19/23  at 1115  Please call the care guide team at [phone] if you need to cancel or reschedule your appointment.   If you are experiencing a Mental Health or Behavioral Health Crisis or need someone to talk to, please call the Suicide and Crisis Lifeline: 988 call the [place] National Suicide Prevention Lifeline: 301 623 6677 or TTY: 630-002-1407 TTY 715-261-0775) to talk to a trained counselor call 1-800-273-TALK (toll free, 24 hour hotline) call the [facility]: [phone] call 911   Patient verbalizes understanding of instructions and care plan provided today and agrees to view in MyChart. Active MyChart status and patient understanding of how to access instructions and care plan via MyChart confirmed with patient.     The patient has been provided with contact information for the care management team and has been advised to call with any health related questions or  concerns.   Erasmo Vertz L. Noelle Penner, RN, BSN, CCM [place]  [facility], [facility] RN Care Manager Direct Dial: [phone]  Fax: [phone] Mailing Address: [address]  [place] [address] Website: [email or web address]

## 2023-05-06 NOTE — Patient Instructions (Addendum)
 Visit Information  Thank you for taking time to visit with me today. Please don't hesitate to contact me if I can be of assistance to you   [facility] (([phone]),  [facility] [phone]),  and [facility] ( call [phone]) are some organizations that provide home visits in the [place] area.   Other organizations that provide MD home visits [facility]: Offers home visits and telemedicine visits [phone] [facility] [place]: Provides community-based services to enrolled individuals who need medical care and support to continue living at home [phone] [place]) [phone])  [facility]  [phone]  and [facility]   Scipio Otho primary care providers In network with her [address] [place], Jeanella Craze, MD [address] [place], [place] [address] [phone]  [facility] [place]  [address] [place] [address] [phone] or [address] [place] [address] [place] [address] OR [phone]   Following are the goals we discussed today:   Goals Addressed             This Visit's Progress    manage health at assisted living, medicine reconciliation -RN CM services   On track    Interventions Today    Flowsheet Row Most Recent Value  General Interventions   Doctor Visits Discussed/Reviewed Doctor Visits Reviewed, PCP  [provided a list of in network [employer] pcps in [place] and some MDs that visit in the home for patient (in AVS & via letter)]              Our next appointment is by telephone on 05/19/23 at 1115  Please call the care guide team at [phone] if you need to cancel or reschedule your appointment.   If you are experiencing a Mental Health or Behavioral Health Crisis or need someone to talk to, please call the Suicide and Crisis Lifeline: 988 call the [place] National Suicide Prevention Lifeline:  303-159-4003 or TTY: (803)135-6215 TTY (838)035-3156) to talk to a trained counselor call 1-800-273-TALK (toll free, 24 hour hotline) call the [facility]: [phone] call 911   Patient verbalizes understanding of instructions and care plan provided today and agrees to view in MyChart. Active MyChart status and patient understanding of how to access instructions and care plan via MyChart confirmed with patient.     The patient has been provided with contact information for the care management team and has been advised to call with any health related questions or concerns.   Sye Schroepfer L. Noelle Penner, RN, BSN, CCM [place]  [facility], [facility] Health RN Care Manager Direct Dial: [phone]  Fax: [phone] Mailing Address: [address]  [place] [address] Website: [email or web address]

## 2023-05-07 DIAGNOSIS — L110 Acquired keratosis follicularis: Secondary | ICD-10-CM | POA: Diagnosis not present

## 2023-05-07 DIAGNOSIS — Z9181 History of falling: Secondary | ICD-10-CM | POA: Diagnosis not present

## 2023-05-07 DIAGNOSIS — F32A Depression, unspecified: Secondary | ICD-10-CM | POA: Diagnosis not present

## 2023-05-07 DIAGNOSIS — I5030 Unspecified diastolic (congestive) heart failure: Secondary | ICD-10-CM | POA: Diagnosis not present

## 2023-05-07 DIAGNOSIS — Z9981 Dependence on supplemental oxygen: Secondary | ICD-10-CM | POA: Diagnosis not present

## 2023-05-07 DIAGNOSIS — M79672 Pain in left foot: Secondary | ICD-10-CM | POA: Diagnosis not present

## 2023-05-07 DIAGNOSIS — D649 Anemia, unspecified: Secondary | ICD-10-CM | POA: Diagnosis not present

## 2023-05-07 DIAGNOSIS — M79671 Pain in right foot: Secondary | ICD-10-CM | POA: Diagnosis not present

## 2023-05-07 DIAGNOSIS — I110 Hypertensive heart disease with heart failure: Secondary | ICD-10-CM | POA: Diagnosis not present

## 2023-05-07 DIAGNOSIS — J449 Chronic obstructive pulmonary disease, unspecified: Secondary | ICD-10-CM | POA: Diagnosis not present

## 2023-05-07 DIAGNOSIS — Z87891 Personal history of nicotine dependence: Secondary | ICD-10-CM | POA: Diagnosis not present

## 2023-05-07 DIAGNOSIS — Z7901 Long term (current) use of anticoagulants: Secondary | ICD-10-CM | POA: Diagnosis not present

## 2023-05-07 DIAGNOSIS — F0390 Unspecified dementia without behavioral disturbance: Secondary | ICD-10-CM | POA: Diagnosis not present

## 2023-05-07 DIAGNOSIS — F0394 Unspecified dementia, unspecified severity, with anxiety: Secondary | ICD-10-CM | POA: Diagnosis not present

## 2023-05-07 DIAGNOSIS — I739 Peripheral vascular disease, unspecified: Secondary | ICD-10-CM | POA: Diagnosis not present

## 2023-05-07 DIAGNOSIS — I4821 Permanent atrial fibrillation: Secondary | ICD-10-CM | POA: Diagnosis not present

## 2023-05-07 DIAGNOSIS — F0393 Unspecified dementia, unspecified severity, with mood disturbance: Secondary | ICD-10-CM | POA: Diagnosis not present

## 2023-05-08 DIAGNOSIS — F3341 Major depressive disorder, recurrent, in partial remission: Secondary | ICD-10-CM | POA: Diagnosis not present

## 2023-05-08 DIAGNOSIS — F419 Anxiety disorder, unspecified: Secondary | ICD-10-CM | POA: Diagnosis not present

## 2023-05-08 DIAGNOSIS — F03A0 Unspecified dementia, mild, without behavioral disturbance, psychotic disturbance, mood disturbance, and anxiety: Secondary | ICD-10-CM | POA: Diagnosis not present

## 2023-05-09 DIAGNOSIS — J449 Chronic obstructive pulmonary disease, unspecified: Secondary | ICD-10-CM | POA: Diagnosis not present

## 2023-05-12 DIAGNOSIS — F0393 Unspecified dementia, unspecified severity, with mood disturbance: Secondary | ICD-10-CM | POA: Diagnosis not present

## 2023-05-12 DIAGNOSIS — Z9981 Dependence on supplemental oxygen: Secondary | ICD-10-CM | POA: Diagnosis not present

## 2023-05-12 DIAGNOSIS — I5030 Unspecified diastolic (congestive) heart failure: Secondary | ICD-10-CM | POA: Diagnosis not present

## 2023-05-12 DIAGNOSIS — I110 Hypertensive heart disease with heart failure: Secondary | ICD-10-CM | POA: Diagnosis not present

## 2023-05-12 DIAGNOSIS — J449 Chronic obstructive pulmonary disease, unspecified: Secondary | ICD-10-CM | POA: Diagnosis not present

## 2023-05-12 DIAGNOSIS — F0394 Unspecified dementia, unspecified severity, with anxiety: Secondary | ICD-10-CM | POA: Diagnosis not present

## 2023-05-12 DIAGNOSIS — Z87891 Personal history of nicotine dependence: Secondary | ICD-10-CM | POA: Diagnosis not present

## 2023-05-12 DIAGNOSIS — Z9181 History of falling: Secondary | ICD-10-CM | POA: Diagnosis not present

## 2023-05-12 DIAGNOSIS — F32A Depression, unspecified: Secondary | ICD-10-CM | POA: Diagnosis not present

## 2023-05-12 DIAGNOSIS — I4821 Permanent atrial fibrillation: Secondary | ICD-10-CM | POA: Diagnosis not present

## 2023-05-12 DIAGNOSIS — Z7901 Long term (current) use of anticoagulants: Secondary | ICD-10-CM | POA: Diagnosis not present

## 2023-05-13 DIAGNOSIS — F32A Depression, unspecified: Secondary | ICD-10-CM | POA: Diagnosis not present

## 2023-05-13 DIAGNOSIS — Z9981 Dependence on supplemental oxygen: Secondary | ICD-10-CM | POA: Diagnosis not present

## 2023-05-13 DIAGNOSIS — J449 Chronic obstructive pulmonary disease, unspecified: Secondary | ICD-10-CM | POA: Diagnosis not present

## 2023-05-13 DIAGNOSIS — Z7901 Long term (current) use of anticoagulants: Secondary | ICD-10-CM | POA: Diagnosis not present

## 2023-05-13 DIAGNOSIS — I4821 Permanent atrial fibrillation: Secondary | ICD-10-CM | POA: Diagnosis not present

## 2023-05-13 DIAGNOSIS — Z9181 History of falling: Secondary | ICD-10-CM | POA: Diagnosis not present

## 2023-05-13 DIAGNOSIS — Z87891 Personal history of nicotine dependence: Secondary | ICD-10-CM | POA: Diagnosis not present

## 2023-05-13 DIAGNOSIS — I5030 Unspecified diastolic (congestive) heart failure: Secondary | ICD-10-CM | POA: Diagnosis not present

## 2023-05-13 DIAGNOSIS — F0394 Unspecified dementia, unspecified severity, with anxiety: Secondary | ICD-10-CM | POA: Diagnosis not present

## 2023-05-13 DIAGNOSIS — F0393 Unspecified dementia, unspecified severity, with mood disturbance: Secondary | ICD-10-CM | POA: Diagnosis not present

## 2023-05-13 DIAGNOSIS — I110 Hypertensive heart disease with heart failure: Secondary | ICD-10-CM | POA: Diagnosis not present

## 2023-05-14 DIAGNOSIS — H6123 Impacted cerumen, bilateral: Secondary | ICD-10-CM | POA: Diagnosis not present

## 2023-05-14 DIAGNOSIS — I10 Essential (primary) hypertension: Secondary | ICD-10-CM | POA: Diagnosis not present

## 2023-05-14 DIAGNOSIS — Z9181 History of falling: Secondary | ICD-10-CM | POA: Diagnosis not present

## 2023-05-14 DIAGNOSIS — Z7901 Long term (current) use of anticoagulants: Secondary | ICD-10-CM | POA: Diagnosis not present

## 2023-05-14 DIAGNOSIS — R419 Unspecified symptoms and signs involving cognitive functions and awareness: Secondary | ICD-10-CM | POA: Diagnosis not present

## 2023-05-14 DIAGNOSIS — F0393 Unspecified dementia, unspecified severity, with mood disturbance: Secondary | ICD-10-CM | POA: Diagnosis not present

## 2023-05-14 DIAGNOSIS — K219 Gastro-esophageal reflux disease without esophagitis: Secondary | ICD-10-CM | POA: Diagnosis not present

## 2023-05-14 DIAGNOSIS — R296 Repeated falls: Secondary | ICD-10-CM | POA: Diagnosis not present

## 2023-05-14 DIAGNOSIS — F419 Anxiety disorder, unspecified: Secondary | ICD-10-CM | POA: Diagnosis not present

## 2023-05-14 DIAGNOSIS — Z9981 Dependence on supplemental oxygen: Secondary | ICD-10-CM | POA: Diagnosis not present

## 2023-05-14 DIAGNOSIS — R4182 Altered mental status, unspecified: Secondary | ICD-10-CM | POA: Diagnosis not present

## 2023-05-14 DIAGNOSIS — J449 Chronic obstructive pulmonary disease, unspecified: Secondary | ICD-10-CM | POA: Diagnosis not present

## 2023-05-14 DIAGNOSIS — I4821 Permanent atrial fibrillation: Secondary | ICD-10-CM | POA: Diagnosis not present

## 2023-05-14 DIAGNOSIS — I482 Chronic atrial fibrillation, unspecified: Secondary | ICD-10-CM | POA: Diagnosis not present

## 2023-05-14 DIAGNOSIS — F0394 Unspecified dementia, unspecified severity, with anxiety: Secondary | ICD-10-CM | POA: Diagnosis not present

## 2023-05-14 DIAGNOSIS — I420 Dilated cardiomyopathy: Secondary | ICD-10-CM | POA: Diagnosis not present

## 2023-05-14 DIAGNOSIS — F32A Depression, unspecified: Secondary | ICD-10-CM | POA: Diagnosis not present

## 2023-05-14 DIAGNOSIS — I110 Hypertensive heart disease with heart failure: Secondary | ICD-10-CM | POA: Diagnosis not present

## 2023-05-14 DIAGNOSIS — I5030 Unspecified diastolic (congestive) heart failure: Secondary | ICD-10-CM | POA: Diagnosis not present

## 2023-05-14 DIAGNOSIS — Z87891 Personal history of nicotine dependence: Secondary | ICD-10-CM | POA: Diagnosis not present

## 2023-05-14 DIAGNOSIS — Z8709 Personal history of other diseases of the respiratory system: Secondary | ICD-10-CM | POA: Diagnosis not present

## 2023-05-14 DIAGNOSIS — R7401 Elevation of levels of liver transaminase levels: Secondary | ICD-10-CM | POA: Diagnosis not present

## 2023-05-16 ENCOUNTER — Encounter: Payer: Self-pay | Admitting: Cardiovascular Disease

## 2023-05-16 ENCOUNTER — Ambulatory Visit: Payer: Self-pay | Admitting: *Deleted

## 2023-05-16 ENCOUNTER — Ambulatory Visit: Payer: Medicare HMO | Attending: Cardiovascular Disease | Admitting: Cardiovascular Disease

## 2023-05-16 VITALS — BP 148/72 | HR 86 | Ht 60.0 in | Wt 127.8 lb

## 2023-05-16 DIAGNOSIS — Z79899 Other long term (current) drug therapy: Secondary | ICD-10-CM | POA: Diagnosis not present

## 2023-05-16 DIAGNOSIS — I10 Essential (primary) hypertension: Secondary | ICD-10-CM

## 2023-05-16 DIAGNOSIS — I480 Paroxysmal atrial fibrillation: Secondary | ICD-10-CM | POA: Diagnosis not present
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
## 2023-05-16 NOTE — Patient Outreach (Signed)
 Care Coordination   Follow Up Visit Note   05/20/2023 Name: Amanda Palmer MRN: [id number] DOB: 1933-03-15  Amanda Palmer is a 89 y.o. year old female who sees Amanda Palmer, Amanda Hazel, MD for primary care. I spoke with  Amanda Palmer by phone today.  What matters to the patients health and wellness today?  Amanda Palmer reporting patient is not taking some of her blood pressure & antibiotic medicines. Confirmed completed pcp visit Amanda Palmer reports patients not wanting antibiotic clindamycin, as it irritates her stomach. Amanda Palmer will encourage patient to take her antibiotics with food to protect her stomach. Patient reported by Amanda Palmer to not want to take her Blood pressure  medicine for a similar reason.  Amanda Palmer voiced understanding that patient Blood pressure should be checked prior to taking blood pressure medicine, just in case she has a value indicating she does not need the medicine. Amanda Palmer indicates she has been told the blood pressure is not being taken.  Amanda Palmer discussed Amanda Palmer told her that her morning and noon medicines have been left for her to take in her room at one time. Amanda Amanda Palmer voiced understanding that assisted living patients can be seen by their pcp or the Assistd Living facility MD.  Amanda Palmer voiced understanding of discussing Amanda Palmer voiced concerns with the facility administrator in person or via email if she is not receiving return calls after leaving messages.     Spoke with Amanda Palmer at [facility] at [phone] . Amanda Palmer provided an email address for [employer], Production designer, theatre/television/film.  Amanda Palmer provided the standard medication routine which includes not combining morning and lunch medicines. Amanda Palmer was able to confirm/witness that on today 05/16/23 the patient was offered her morning medicines after she ate breakfast but the patient still refused to take them at that time.   Goals Addressed             This Visit's Progress    manage health at assisted living,  medicine reconciliation -RN CM services       Interventions Today    Flowsheet Row Most Recent Value  Chronic Disease   Chronic disease during today's visit Other  [not taking medicines at ALF R/t side effects, voiced concerns with [facility] care. collaboration with [employer] staff Amanda Palmer]  General Interventions   General Interventions Discussed/Reviewed General Interventions Reviewed, Communication with, Doctor Visits  Doctor Visits Discussed/Reviewed Doctor Visits Reviewed, PCP  PCP/Specialist Visits Compliance with follow-up visit  Education Interventions   Education Provided Provided Education  Provided Verbal Education On Medication, Nutrition, Other  [importance of taking hyper tension medicine if BP elevated, Hold if not elevated, + taking antibiotic with meal, referred to [facility] administrators]  Mental Health Interventions   Mental Health Discussed/Reviewed Mental Health Reviewed, Coping Strategies  Nutrition Interventions   Nutrition Discussed/Reviewed Nutrition Reviewed, Supplemental nutrition  Pharmacy Interventions   Pharmacy Dicussed/Reviewed Pharmacy Topics Reviewed, Medications and their functions  Safety Interventions   Safety Discussed/Reviewed Safety Reviewed              SDOH assessments and interventions completed:  No     Care Coordination Interventions:  Yes, provided   Follow up plan: Follow up call scheduled for 05/20/23    Encounter Outcome:  Patient Visit Completed   Amanda Bradford L. Noelle Penner, RN, BSN, CCM [place]  [facility], [facility] Health RN Care Manager Direct Dial: [phone]  Fax: [phone]

## 2023-05-16 NOTE — Patient Instructions (Addendum)
 Medication Instructions:   Continue all current medications.    Labwork:  CMET, TSH - orders given today Office will contact with results via phone, letter or mychart.     Testing/Procedures:  none  Follow-Up:  6 months   Any Other Special Instructions Will Be Listed Below (If Applicable).   If you need a refill on your cardiac medications before your next appointment, please call your pharmacy.

## 2023-05-19 DIAGNOSIS — F0394 Unspecified dementia, unspecified severity, with anxiety: Secondary | ICD-10-CM | POA: Diagnosis not present

## 2023-05-19 DIAGNOSIS — F32A Depression, unspecified: Secondary | ICD-10-CM | POA: Diagnosis not present

## 2023-05-19 DIAGNOSIS — I5030 Unspecified diastolic (congestive) heart failure: Secondary | ICD-10-CM | POA: Diagnosis not present

## 2023-05-19 DIAGNOSIS — I110 Hypertensive heart disease with heart failure: Secondary | ICD-10-CM | POA: Diagnosis not present

## 2023-05-19 DIAGNOSIS — Z9181 History of falling: Secondary | ICD-10-CM | POA: Diagnosis not present

## 2023-05-19 DIAGNOSIS — Z9981 Dependence on supplemental oxygen: Secondary | ICD-10-CM | POA: Diagnosis not present

## 2023-05-19 DIAGNOSIS — J449 Chronic obstructive pulmonary disease, unspecified: Secondary | ICD-10-CM | POA: Diagnosis not present

## 2023-05-19 DIAGNOSIS — Z87891 Personal history of nicotine dependence: Secondary | ICD-10-CM | POA: Diagnosis not present

## 2023-05-19 DIAGNOSIS — Z7901 Long term (current) use of anticoagulants: Secondary | ICD-10-CM | POA: Diagnosis not present

## 2023-05-19 DIAGNOSIS — F0393 Unspecified dementia, unspecified severity, with mood disturbance: Secondary | ICD-10-CM | POA: Diagnosis not present

## 2023-05-19 DIAGNOSIS — I4821 Permanent atrial fibrillation: Secondary | ICD-10-CM | POA: Diagnosis not present

## 2023-05-20 ENCOUNTER — Ambulatory Visit: Payer: Self-pay | Admitting: *Deleted

## 2023-05-20 DIAGNOSIS — J449 Chronic obstructive pulmonary disease, unspecified: Secondary | ICD-10-CM | POA: Diagnosis not present

## 2023-05-20 NOTE — Patient Outreach (Addendum)
 Care Coordination   Follow Up Visit Note   05/20/2023 Name: Amanda Palmer MRN: [id number] DOB: [birth year]  Amanda Palmer is a 89 y.o. year old female who sees Margo Aye, Kathleene Hazel, MD for primary care. I spoke with  Alphonsus Sias by phone today.  What matters to the patients health and wellness today?  Patient still taking antibiotics but still voices abdominal concerns. Per Ms Salome Spotted she is wanting to discontinue the antibiotic but will not be able to do so as she needs to complete the dose before any dental services [name] voiced understanding of the need to outreach to the dentist again to provide an update on the clindamycin GI symptoms. Voiced understanding that Mrs Feldt was able to tolerate doxycycline in July 2024 & this may be and option  Ms Salome Spotted states she offered to take patient to get labs completed but Mrs Broadfoot refused to get out of bed on 05/19/23.  Updated niece on outreach to [place] on 05/16/23 [facility] voiced understanding of the discussion about 1) medication administrations for morning and lunch 2) providing medications after meals 3) and Mrs Cahn options to see any MD she prefers- either her pcp or [facility] house MD      Goals Addressed             This Visit's Progress    manage health at assisted living, medicine reconciliation -RN CM services   On track    Interventions Today    Flowsheet Row Most Recent Value  Chronic Disease   Chronic disease during today's visit Other  [antibiotic use prior to dental services/GI symptoms/follow up with family on response of outreach with Olegario Messier at [place] assisted living]  General Interventions   General Interventions Discussed/Reviewed General Interventions Reviewed, [employer], Doctor Visits, Sick Day Rules  Doctor Visits Discussed/Reviewed Doctor Visits Reviewed, PCP, Specialist  PCP/Specialist Visits --  [patient refused to go to her lab visit on 05/19/23 related GI symptoms. Encouraged [name] to  outreach to update the dentist for possible assist with another antibiotic]  Education Interventions   Education Provided Provided Education  [how to outreach to gt a change in medicine providiing an adverse effect]  Provided Verbal Education On [employer], Sick Day Rules, Other  [importance of antibiotics prior to dental services, effects of antibiotics on stomach]  Mental Health Interventions   Mental Health Discussed/Reviewed Mental Health Reviewed, Coping Strategies  Nutrition Interventions   Nutrition Discussed/Reviewed Nutrition Reviewed, Fluid intake  Pharmacy Interventions   Pharmacy Dicussed/Reviewed Pharmacy Topics Reviewed, Medications and their functions  Advanced Directive Interventions   Advanced Directives Discussed/Reviewed Advanced Directives Discussed              SDOH assessments and interventions completed:  No     Care Coordination Interventions:  Yes, provided   Follow up plan: Follow up call scheduled for 06/10/23    Encounter Outcome:  Patient Visit Completed   Cala Bradford L. Noelle Penner, RN, BSN, CCM [place]  [facility], [facility] Health RN Care Manager Direct Dial: [phone]  Fax: [phone]

## 2023-05-20 NOTE — Patient Instructions (Signed)
 Visit Information  Thank you for taking time to visit with me today. Please don't hesitate to contact me if I can be of assistance to you.   Following are the goals we discussed today:   Goals Addressed             This Visit's Progress    manage health at assisted living, medicine reconciliation -RN CM services       Interventions Today    Flowsheet Row Most Recent Value  Chronic Disease   Chronic disease during today's visit Other  [not taking medicines at ALF R/t side effects, voiced concerns with [facility] care. collaboration with [employer] staff Kathy]  General Interventions   General Interventions Discussed/Reviewed General Interventions Reviewed, Communication with, Doctor Visits  Doctor Visits Discussed/Reviewed Doctor Visits Reviewed, PCP  PCP/Specialist Visits Compliance with follow-up visit  Education Interventions   Education Provided Provided Education  Provided Verbal Education On Medication, Nutrition, Other  [importance of taking hyper tension medicine if BP elevated, Hold if not elevated, + taking antibiotic with meal, referred to [facility] administrators]  Mental Health Interventions   Mental Health Discussed/Reviewed Mental Health Reviewed, Coping Strategies  Nutrition Interventions   Nutrition Discussed/Reviewed Nutrition Reviewed, Supplemental nutrition  Pharmacy Interventions   Pharmacy Dicussed/Reviewed Pharmacy Topics Reviewed, Medications and their functions  Safety Interventions   Safety Discussed/Reviewed Safety Reviewed              Our next appointment is by telephone on 05/20/23 at 1115  Please call the care guide team at [phone] if you need to cancel or reschedule your appointment.   If you are experiencing a Mental Health or Behavioral Health Crisis or need someone to talk to, please call the Suicide and Crisis Lifeline: 988 call the [place] National Suicide Prevention Lifeline: 705-615-4658 or TTY: (360)267-2407 TTY 979-859-8236) to  talk to a trained counselor call 1-800-273-TALK (toll free, 24 hour hotline) call the [facility]: [phone] call 911   Patient verbalizes understanding of instructions and care plan provided today and agrees to view in MyChart. Active MyChart status and patient understanding of how to access instructions and care plan via MyChart confirmed with patient.     The patient has been provided with contact information for the care management team and has been advised to call with any health related questions or concerns.   Sunya Humbarger L. Noelle Penner, RN, BSN, CCM [place]  [facility], [facility] Health RN Care Manager Direct Dial: [phone]  Fax: [phone]

## 2023-05-20 NOTE — Patient Instructions (Addendum)
 Visit Information  Thank you for taking time to visit with me today. Please don't hesitate to contact me if I can be of assistance to you.   Following are the goals we discussed today:   Goals Addressed             This Visit's Progress    manage health at assisted living, medicine reconciliation -RN CM services   On track    Interventions Today    Flowsheet Row Most Recent Value  Chronic Disease   Chronic disease during today's visit Other  [antibiotic use prior to dental services/GI symptoms/follow up with family on response of outreach with Olegario Messier at [place] assisted living]  General Interventions   General Interventions Discussed/Reviewed General Interventions Reviewed, [facility], Doctor Visits, Sick Day Rules  Doctor Visits Discussed/Reviewed Doctor Visits Reviewed, PCP, Specialist  PCP/Specialist Visits --  [patient refused to go to her lab visit on 05/19/23 related GI symptoms. Encouraged [name] to outreach to update the dentist for possible assist with another antibiotic]  Education Interventions   Education Provided Provided Education  [how to outreach to gt a change in medicine providiing an adverse effect]  Provided Verbal Education On [employer], Sick Day Rules, Other  [importance of antibiotics prior to dental services, effects of antibiotics on stomach]  Mental Health Interventions   Mental Health Discussed/Reviewed Mental Health Reviewed, Coping Strategies  Nutrition Interventions   Nutrition Discussed/Reviewed Nutrition Reviewed, Fluid intake  Pharmacy Interventions   Pharmacy Dicussed/Reviewed Pharmacy Topics Reviewed, Medications and their functions  Advanced Directive Interventions   Advanced Directives Discussed/Reviewed Advanced Directives Discussed              Our next appointment is by telephone on 06/10/23 at 1030  Please call the care guide team at [phone] if you need to cancel or reschedule your appointment.   If you  are experiencing a Mental Health or Behavioral Health Crisis or need someone to talk to, please call the Suicide and Crisis Lifeline: 988 call the [place] National Suicide Prevention Lifeline: 7621791471 or TTY: 641-304-4330 TTY (612)039-1342) to talk to a trained counselor call 1-800-273-TALK (toll free, 24 hour hotline) call the [facility]: [phone] call 911   Patient verbalizes understanding of instructions and care plan provided today and agrees to view in MyChart. Active MyChart status and patient understanding of how to access instructions and care plan via MyChart confirmed with patient.     The patient has been provided with contact information for the care management team and has been advised to call with any health related questions or concerns.    Betti Goodenow L. Noelle Penner, RN, BSN, CCM [place]  [facility], [facility] Health RN Care Manager Direct Dial: [phone]  Fax: [phone]

## 2023-05-22 ENCOUNTER — Ambulatory Visit: Payer: Self-pay | Admitting: *Deleted

## 2023-05-22 NOTE — Patient Outreach (Signed)
 Care Coordination   Follow Up Visit Note   05/22/2023 Name: Amanda Palmer MRN: [id number] DOB: 1934-01-23  Amanda Palmer is a 89 y.o. year old female who sees Amanda Palmer, Amanda Hazel, MD for primary care. I  received a voice message from [name] about email for [facility] & medicine refills  What matters to the patients health and wellness today?   [id number]$ .com sent via text to [name] as requested in voice message received + sent a message for her to call pcp and/or cardiologist to call to facility to renew/refill prescriptions    Goals Addressed             This Visit's Progress    manage health at assisted living, medicine reconciliation -RN CM services       Interventions Today    Flowsheet Row Most Recent Value  Chronic Disease   Chronic disease during today's visit Other  [[id number]$ .com sent via text to [name] as requested in voice message received  + sent a message for her to call pcp and/or cardiologist to call to facility to renew/refill prescriptions]  General Interventions   General Interventions Discussed/Reviewed General Interventions Reviewed, Communication with  Communication with PCP/Specialists  [email to Olegario Messier at pcp office to have staff to call in refills for patient at [facility]]  Pharmacy Interventions   Pharmacy Dicussed/Reviewed Pharmacy Topics Reviewed, Medication Adherence  Medication Adherence Unable to refill medication              SDOH assessments and interventions completed:  No     Care Coordination Interventions:  Yes, provided   Follow up plan: Follow up call scheduled for 06/10/23    Encounter Outcome:  Patient Visit Completed   Cala Bradford L. Noelle Penner, RN, BSN, CCM [place]  [facility], [facility] RN Care Manager Direct Dial: [phone]  Fax: [phone]

## 2023-05-22 NOTE — Patient Instructions (Signed)
 Visit Information  Thank you for taking time to visit with me today. Please don't hesitate to contact me if I can be of assistance to you.   *The email address for [facility] provided on 05/16/23 by Olegario Messier is  [id number]_[id number]$  or try  [id number]$$[id number]_NIDPOEUMPNTIRWERXVQMGQQPYPPJKDTO$ .com  Following are the goals we discussed today:   Goals Addressed             This Visit's Progress    manage health at assisted living, medicine reconciliation -RN CM services       Interventions Today    Flowsheet Row Most Recent Value  Chronic Disease   Chronic disease during today's visit Other  [[id number]_[id number]INOMVE$ .com sent via text to [name] as requested in voice message received  + sent a message for her to call pcp and/or cardiologist to call to facility to renew/refill prescriptions]  General Interventions   General Interventions Discussed/Reviewed General Interventions Reviewed, Communication with  Communication with PCP/Specialists  [email to Olegario Messier at pcp office to have staff to call in refills for patient at [facility]  Pharmacy Interventions   Pharmacy Dicussed/Reviewed Pharmacy Topics Reviewed, Medication Adherence  Medication Adherence Unable to refill medication              Our next appointment is by telephone on 06/10/23 at 1030  Please call the care guide team at [phone] if you need to cancel or reschedule your appointment.   If you are experiencing a Mental Health or Behavioral Health Crisis or need someone to talk to, please call the Suicide and Crisis Lifeline: 988 call the [place] National Suicide Prevention Lifeline: 418-679-7027 or TTY: 218-707-9659 TTY (772)786-4463) to talk to a trained counselor call 1-800-273-TALK (toll free, 24 hour hotline) call the [facility]: 2016313379 call 911   Patient verbalizes understanding of instructions and care plan provided today and agrees to view in MyChart. Active MyChart status and patient understanding of how to access  instructions and care plan via MyChart confirmed with patient.     The patient has been provided with contact information for the care management team and has been advised to call with any health related questions or concerns.   Amanda Yan L. Noelle Penner, RN, BSN, CCM [place]  [facility], [facility] RN Care Manager Direct Dial: [phone]  Fax: [phone]

## 2023-05-23 DIAGNOSIS — I480 Paroxysmal atrial fibrillation: Secondary | ICD-10-CM | POA: Diagnosis not present

## 2023-05-23 DIAGNOSIS — Z79899 Other long term (current) drug therapy: Secondary | ICD-10-CM | POA: Diagnosis not present

## 2023-05-24 LAB — COMPREHENSIVE METABOLIC PANEL
ALT: 17 IU/L (ref 0–32)
AST: 21 IU/L (ref 0–40)
Albumin: 3.3 g/dL — ABNORMAL LOW (ref 3.7–4.7)
Alkaline Phosphatase: 100 IU/L (ref 44–121)
BUN/Creatinine Ratio: 16 (ref 12–28)
BUN: 17 mg/dL (ref 8–27)
Bilirubin Total: 0.4 mg/dL (ref 0.0–1.2)
CO2: 24 mmol/L (ref 20–29)
Calcium: 9.3 mg/dL (ref 8.7–10.3)
Chloride: 92 mmol/L — ABNORMAL LOW (ref 96–106)
Creatinine, Ser: 1.08 mg/dL — ABNORMAL HIGH (ref 0.57–1.00)
Globulin, Total: 4.2 g/dL (ref 1.5–4.5)
Glucose: 83 mg/dL (ref 70–99)
Potassium: 4.7 mmol/L (ref 3.5–5.2)
Sodium: 129 mmol/L — ABNORMAL LOW (ref 134–144)
Total Protein: 7.5 g/dL (ref 6.0–8.5)
eGFR: 50 mL/min/{1.73_m2} — ABNORMAL LOW (ref >59)

## 2023-05-24 LAB — TSH: TSH: 5.39 u[IU]/mL — ABNORMAL HIGH (ref 0.450–4.500)

## 2023-05-27 ENCOUNTER — Encounter: Payer: Self-pay | Admitting: Cardiovascular Disease

## 2023-05-27 DIAGNOSIS — I5030 Unspecified diastolic (congestive) heart failure: Secondary | ICD-10-CM | POA: Diagnosis not present

## 2023-05-27 DIAGNOSIS — F0394 Unspecified dementia, unspecified severity, with anxiety: Secondary | ICD-10-CM | POA: Diagnosis not present

## 2023-05-27 DIAGNOSIS — F32A Depression, unspecified: Secondary | ICD-10-CM | POA: Diagnosis not present

## 2023-05-27 DIAGNOSIS — I4821 Permanent atrial fibrillation: Secondary | ICD-10-CM | POA: Diagnosis not present

## 2023-05-27 DIAGNOSIS — Z87891 Personal history of nicotine dependence: Secondary | ICD-10-CM | POA: Diagnosis not present

## 2023-05-27 DIAGNOSIS — I110 Hypertensive heart disease with heart failure: Secondary | ICD-10-CM | POA: Diagnosis not present

## 2023-05-27 DIAGNOSIS — Z9181 History of falling: Secondary | ICD-10-CM | POA: Diagnosis not present

## 2023-05-27 DIAGNOSIS — F0393 Unspecified dementia, unspecified severity, with mood disturbance: Secondary | ICD-10-CM | POA: Diagnosis not present

## 2023-05-27 DIAGNOSIS — Z9981 Dependence on supplemental oxygen: Secondary | ICD-10-CM | POA: Diagnosis not present

## 2023-05-27 DIAGNOSIS — J449 Chronic obstructive pulmonary disease, unspecified: Secondary | ICD-10-CM | POA: Diagnosis not present

## 2023-05-27 DIAGNOSIS — Z7901 Long term (current) use of anticoagulants: Secondary | ICD-10-CM | POA: Diagnosis not present

## 2023-06-01 DIAGNOSIS — R220 Localized swelling, mass and lump, head: Secondary | ICD-10-CM | POA: Diagnosis not present

## 2023-06-01 DIAGNOSIS — Z471 Aftercare following joint replacement surgery: Secondary | ICD-10-CM | POA: Diagnosis not present

## 2023-06-01 DIAGNOSIS — Z880 Allergy status to penicillin: Secondary | ICD-10-CM | POA: Diagnosis not present

## 2023-06-01 DIAGNOSIS — M79662 Pain in left lower leg: Secondary | ICD-10-CM | POA: Diagnosis not present

## 2023-06-01 DIAGNOSIS — Z743 Need for continuous supervision: Secondary | ICD-10-CM | POA: Diagnosis not present

## 2023-06-01 DIAGNOSIS — S0990XA Unspecified injury of head, initial encounter: Secondary | ICD-10-CM | POA: Diagnosis not present

## 2023-06-01 DIAGNOSIS — S299XXA Unspecified injury of thorax, initial encounter: Secondary | ICD-10-CM | POA: Diagnosis not present

## 2023-06-01 DIAGNOSIS — Z7901 Long term (current) use of anticoagulants: Secondary | ICD-10-CM | POA: Diagnosis not present

## 2023-06-01 DIAGNOSIS — M25562 Pain in left knee: Secondary | ICD-10-CM | POA: Diagnosis not present

## 2023-06-01 DIAGNOSIS — W01198A Fall on same level from slipping, tripping and stumbling with subsequent striking against other object, initial encounter: Secondary | ICD-10-CM | POA: Diagnosis not present

## 2023-06-01 DIAGNOSIS — E785 Hyperlipidemia, unspecified: Secondary | ICD-10-CM | POA: Diagnosis not present

## 2023-06-01 DIAGNOSIS — R519 Headache, unspecified: Secondary | ICD-10-CM | POA: Diagnosis not present

## 2023-06-01 DIAGNOSIS — Z885 Allergy status to narcotic agent status: Secondary | ICD-10-CM | POA: Diagnosis not present

## 2023-06-01 DIAGNOSIS — M47812 Spondylosis without myelopathy or radiculopathy, cervical region: Secondary | ICD-10-CM | POA: Diagnosis not present

## 2023-06-01 DIAGNOSIS — Z96652 Presence of left artificial knee joint: Secondary | ICD-10-CM | POA: Diagnosis not present

## 2023-06-01 DIAGNOSIS — Z79899 Other long term (current) drug therapy: Secondary | ICD-10-CM | POA: Diagnosis not present

## 2023-06-01 DIAGNOSIS — R918 Other nonspecific abnormal finding of lung field: Secondary | ICD-10-CM | POA: Diagnosis not present

## 2023-06-01 DIAGNOSIS — M50323 Other cervical disc degeneration at C6-C7 level: Secondary | ICD-10-CM | POA: Diagnosis not present

## 2023-06-01 DIAGNOSIS — W010XXA Fall on same level from slipping, tripping and stumbling without subsequent striking against object, initial encounter: Secondary | ICD-10-CM | POA: Diagnosis not present

## 2023-06-01 DIAGNOSIS — S199XXA Unspecified injury of neck, initial encounter: Secondary | ICD-10-CM | POA: Diagnosis not present

## 2023-06-01 DIAGNOSIS — J449 Chronic obstructive pulmonary disease, unspecified: Secondary | ICD-10-CM | POA: Diagnosis not present

## 2023-06-01 DIAGNOSIS — I10 Essential (primary) hypertension: Secondary | ICD-10-CM | POA: Diagnosis not present

## 2023-06-01 DIAGNOSIS — S8992XA Unspecified injury of left lower leg, initial encounter: Secondary | ICD-10-CM | POA: Diagnosis not present

## 2023-06-01 DIAGNOSIS — W19XXXA Unspecified fall, initial encounter: Secondary | ICD-10-CM | POA: Diagnosis not present

## 2023-06-01 DIAGNOSIS — Z882 Allergy status to sulfonamides status: Secondary | ICD-10-CM | POA: Diagnosis not present

## 2023-06-01 DIAGNOSIS — S0083XA Contusion of other part of head, initial encounter: Secondary | ICD-10-CM | POA: Diagnosis not present

## 2023-06-01 DIAGNOSIS — S0993XA Unspecified injury of face, initial encounter: Secondary | ICD-10-CM | POA: Diagnosis not present

## 2023-06-01 DIAGNOSIS — Z888 Allergy status to other drugs, medicaments and biological substances status: Secondary | ICD-10-CM | POA: Diagnosis not present

## 2023-06-02 DIAGNOSIS — F3341 Major depressive disorder, recurrent, in partial remission: Secondary | ICD-10-CM | POA: Diagnosis not present

## 2023-06-02 DIAGNOSIS — F419 Anxiety disorder, unspecified: Secondary | ICD-10-CM | POA: Diagnosis not present

## 2023-06-03 DIAGNOSIS — R911 Solitary pulmonary nodule: Secondary | ICD-10-CM | POA: Diagnosis not present

## 2023-06-03 DIAGNOSIS — I5030 Unspecified diastolic (congestive) heart failure: Secondary | ICD-10-CM | POA: Diagnosis not present

## 2023-06-03 DIAGNOSIS — I4821 Permanent atrial fibrillation: Secondary | ICD-10-CM | POA: Diagnosis not present

## 2023-06-03 DIAGNOSIS — G4734 Idiopathic sleep related nonobstructive alveolar hypoventilation: Secondary | ICD-10-CM | POA: Insufficient documentation

## 2023-06-03 DIAGNOSIS — F32A Depression, unspecified: Secondary | ICD-10-CM | POA: Diagnosis not present

## 2023-06-03 DIAGNOSIS — Z9181 History of falling: Secondary | ICD-10-CM | POA: Diagnosis not present

## 2023-06-03 DIAGNOSIS — Z9981 Dependence on supplemental oxygen: Secondary | ICD-10-CM | POA: Diagnosis not present

## 2023-06-03 DIAGNOSIS — I110 Hypertensive heart disease with heart failure: Secondary | ICD-10-CM | POA: Diagnosis not present

## 2023-06-03 DIAGNOSIS — J449 Chronic obstructive pulmonary disease, unspecified: Secondary | ICD-10-CM | POA: Diagnosis not present

## 2023-06-03 DIAGNOSIS — Z7901 Long term (current) use of anticoagulants: Secondary | ICD-10-CM | POA: Diagnosis not present

## 2023-06-03 DIAGNOSIS — Z87891 Personal history of nicotine dependence: Secondary | ICD-10-CM | POA: Diagnosis not present

## 2023-06-03 DIAGNOSIS — F0393 Unspecified dementia, unspecified severity, with mood disturbance: Secondary | ICD-10-CM | POA: Diagnosis not present

## 2023-06-03 DIAGNOSIS — F0394 Unspecified dementia, unspecified severity, with anxiety: Secondary | ICD-10-CM | POA: Diagnosis not present

## 2023-06-04 DIAGNOSIS — I10 Essential (primary) hypertension: Secondary | ICD-10-CM | POA: Diagnosis not present

## 2023-06-04 DIAGNOSIS — K219 Gastro-esophageal reflux disease without esophagitis: Secondary | ICD-10-CM | POA: Diagnosis not present

## 2023-06-04 DIAGNOSIS — D649 Anemia, unspecified: Secondary | ICD-10-CM | POA: Diagnosis not present

## 2023-06-04 DIAGNOSIS — E559 Vitamin D deficiency, unspecified: Secondary | ICD-10-CM | POA: Diagnosis not present

## 2023-06-04 DIAGNOSIS — I739 Peripheral vascular disease, unspecified: Secondary | ICD-10-CM | POA: Diagnosis not present

## 2023-06-04 DIAGNOSIS — R7301 Impaired fasting glucose: Secondary | ICD-10-CM | POA: Diagnosis not present

## 2023-06-04 DIAGNOSIS — R419 Unspecified symptoms and signs involving cognitive functions and awareness: Secondary | ICD-10-CM | POA: Diagnosis not present

## 2023-06-04 DIAGNOSIS — R7401 Elevation of levels of liver transaminase levels: Secondary | ICD-10-CM | POA: Diagnosis not present

## 2023-06-04 DIAGNOSIS — J449 Chronic obstructive pulmonary disease, unspecified: Secondary | ICD-10-CM | POA: Diagnosis not present

## 2023-06-04 DIAGNOSIS — I420 Dilated cardiomyopathy: Secondary | ICD-10-CM | POA: Diagnosis not present

## 2023-06-04 DIAGNOSIS — G629 Polyneuropathy, unspecified: Secondary | ICD-10-CM | POA: Diagnosis not present

## 2023-06-04 DIAGNOSIS — F419 Anxiety disorder, unspecified: Secondary | ICD-10-CM | POA: Diagnosis not present

## 2023-06-04 DIAGNOSIS — R296 Repeated falls: Secondary | ICD-10-CM | POA: Diagnosis not present

## 2023-06-04 DIAGNOSIS — I482 Chronic atrial fibrillation, unspecified: Secondary | ICD-10-CM | POA: Diagnosis not present

## 2023-06-04 DIAGNOSIS — F3341 Major depressive disorder, recurrent, in partial remission: Secondary | ICD-10-CM | POA: Diagnosis not present

## 2023-06-06 DIAGNOSIS — I110 Hypertensive heart disease with heart failure: Secondary | ICD-10-CM | POA: Diagnosis not present

## 2023-06-06 DIAGNOSIS — Z87891 Personal history of nicotine dependence: Secondary | ICD-10-CM | POA: Diagnosis not present

## 2023-06-06 DIAGNOSIS — Z9181 History of falling: Secondary | ICD-10-CM | POA: Diagnosis not present

## 2023-06-06 DIAGNOSIS — Z7901 Long term (current) use of anticoagulants: Secondary | ICD-10-CM | POA: Diagnosis not present

## 2023-06-06 DIAGNOSIS — Z9981 Dependence on supplemental oxygen: Secondary | ICD-10-CM | POA: Diagnosis not present

## 2023-06-06 DIAGNOSIS — F0393 Unspecified dementia, unspecified severity, with mood disturbance: Secondary | ICD-10-CM | POA: Diagnosis not present

## 2023-06-06 DIAGNOSIS — F32A Depression, unspecified: Secondary | ICD-10-CM | POA: Diagnosis not present

## 2023-06-06 DIAGNOSIS — I4821 Permanent atrial fibrillation: Secondary | ICD-10-CM | POA: Diagnosis not present

## 2023-06-06 DIAGNOSIS — F0394 Unspecified dementia, unspecified severity, with anxiety: Secondary | ICD-10-CM | POA: Diagnosis not present

## 2023-06-06 DIAGNOSIS — J449 Chronic obstructive pulmonary disease, unspecified: Secondary | ICD-10-CM | POA: Diagnosis not present

## 2023-06-06 DIAGNOSIS — I5030 Unspecified diastolic (congestive) heart failure: Secondary | ICD-10-CM | POA: Diagnosis not present

## 2023-06-07 DIAGNOSIS — J449 Chronic obstructive pulmonary disease, unspecified: Secondary | ICD-10-CM | POA: Diagnosis not present

## 2023-06-10 ENCOUNTER — Ambulatory Visit: Payer: Self-pay | Admitting: *Deleted

## 2023-06-10 DIAGNOSIS — Z87891 Personal history of nicotine dependence: Secondary | ICD-10-CM | POA: Diagnosis not present

## 2023-06-10 DIAGNOSIS — I110 Hypertensive heart disease with heart failure: Secondary | ICD-10-CM | POA: Diagnosis not present

## 2023-06-10 DIAGNOSIS — F0394 Unspecified dementia, unspecified severity, with anxiety: Secondary | ICD-10-CM | POA: Diagnosis not present

## 2023-06-10 DIAGNOSIS — Z9181 History of falling: Secondary | ICD-10-CM | POA: Diagnosis not present

## 2023-06-10 DIAGNOSIS — Z7901 Long term (current) use of anticoagulants: Secondary | ICD-10-CM | POA: Diagnosis not present

## 2023-06-10 DIAGNOSIS — F32A Depression, unspecified: Secondary | ICD-10-CM | POA: Diagnosis not present

## 2023-06-10 DIAGNOSIS — J449 Chronic obstructive pulmonary disease, unspecified: Secondary | ICD-10-CM | POA: Diagnosis not present

## 2023-06-10 DIAGNOSIS — F0393 Unspecified dementia, unspecified severity, with mood disturbance: Secondary | ICD-10-CM | POA: Diagnosis not present

## 2023-06-10 DIAGNOSIS — I5030 Unspecified diastolic (congestive) heart failure: Secondary | ICD-10-CM | POA: Diagnosis not present

## 2023-06-10 DIAGNOSIS — I4821 Permanent atrial fibrillation: Secondary | ICD-10-CM | POA: Diagnosis not present

## 2023-06-10 DIAGNOSIS — Z9981 Dependence on supplemental oxygen: Secondary | ICD-10-CM | POA: Diagnosis not present

## 2023-06-10 NOTE — Patient Outreach (Signed)
 Care Coordination   06/10/2023 Name: Amanda Palmer MRN: [id number] DOB: 1933-10-02   Care Coordination Outreach Attempts:  An unsuccessful outreach was attempted for an appointment today.  Follow Up Plan:  Additional outreach attempts will be made to offer the patient complex care management information and services.   Encounter Outcome:  Patient Visit Completed   Care Coordination Interventions:  No, not indicated    Gopal Malter L. Noelle Penner, RN, BSN, CCM [place]  [facility], [facility] RN Care Manager Direct Dial: [phone]  Fax: [phone]

## 2023-06-12 ENCOUNTER — Ambulatory Visit: Payer: Self-pay | Admitting: *Deleted

## 2023-06-12 NOTE — Patient Outreach (Addendum)
 Care Coordination   Follow Up Visit Note   06/12/2023 Name: Amanda Palmer MRN: [id number] DOB: Jun 01, 1933  Amanda Palmer is a 89 y.o. year old female who sees Margo Aye, Kathleene Hazel, MD for primary care. I spoke with  [name]'s niece, [name] (on [employer] (DPR) form) by phone today.  What matters to the patients health and wellness today?  Not sure if Assisted living medication list has been reconciled, Mychart4, abdominal pain/? Active with GI Niece confirms [name] is not sending any medical records to any of her MDs. Patient reported to have visits with Dr Margo Aye, Dr Craige Cotta, Dr Nelly Laurence without records provided from facility. Niece confirmed patient is doing better. Confirms she has new hearing aid. Niece visited on 06/11/23 and reports the only complaint patient voiced was abdominal pain Niece recalls & EPIC indicates last visit with Dr Geni Bers in November 2023  Niece voiced possible availability to make an appointment for patient to be evaluated Mychart needs to be reset. Voiced appreciation for assist to reset password in my chart today     Goals Addressed             This Visit's Progress    manage health at assisted living, medicine reconciliation -RN CM services   Not on track    Update 06/12/23 Niece has taken patient to appointments and requested records from facility. None has been offered. Niece states facility not sharing medication list.  Patient health at assisted living reported to be improving but only has remaining abdominal pain. After confirming that patient remains active with local GI, niece may assist patient with a follow up visit to GI   Interventions Today    Flowsheet Row Most Recent Value  Chronic Disease   Chronic disease during today's visit Other  General Interventions   General Interventions Discussed/Reviewed General Interventions Reviewed, Doctor Visits, Communication with, Durable Medical Equipment (DME)  [provided niece with email address  to [facility] provided by Leighton Parody wrote the email address and repeated it to RN CM]  Doctor Visits Discussed/Reviewed Doctor Visits Reviewed, PCP, Specialist  [Discussed Access to Local Gi MD for symptoms of abdominal pain at intervals reported by niece from last visit with patient]  Durable Medical Equipment (DME) Other  [hearing aid obtained]  PCP/Specialist Visits Compliance with follow-up visit  Communication with PCP/Specialists, RN  [to check with pcp about medicine list If obtained from facility,]  Education Interventions   Education Provided Provided Education, Provided Web-based Education  [my chart access, medical records for MD visits, GI MD services, abdominal pain]  Provided Verbal Education On When to see the doctor, Community Resources  Mental Health Interventions   Mental Health Discussed/Reviewed Mental Health Reviewed, Coping Strategies  [reported in better spirits]  Pharmacy Interventions   Pharmacy Dicussed/Reviewed Pharmacy Topics Reviewed, Medication Adherence  Medication Adherence Not taking medication  [assessed for reconciliation completion- Niece unsure of completion. Confirmed facility is not sending records with patient to MD visits]  Safety Interventions   Safety Discussed/Reviewed Safety Reviewed, Fall Risk, Home Safety  Home Safety Assistive Devices              SDOH assessments and interventions completed:  No     Care Coordination Interventions:  Yes, provided   Follow up plan: Follow up call scheduled for 07/03/23 1100    Encounter Outcome:  Patient Visit Completed   Javar Eshbach L. Noelle Penner, RN, BSN, CCM [place]  [facility], [facility] RN Care  Insurance underwriter Dial: [phone]  Fax: [phone]

## 2023-06-12 NOTE — Patient Instructions (Addendum)
 Visit Information  Thank you for taking time to visit with me today. Please don't hesitate to contact me if I can be of assistance to you.   Following are the goals we discussed today:   Goals Addressed             This Visit's Progress    manage health at assisted living, medicine reconciliation -RN CM services   Not on track    Update 06/12/23 Niece has taken patient to appointments and requested records from facility. None has been offered. Niece states facility not sharing medication list.  Patient health at assisted living reported to be improving but only has remaining abdominal pain. After confirming that patient remains active with local GI, niece may assist patient with a follow up visit to GI   Interventions Today    Flowsheet Row Most Recent Value  Chronic Disease   Chronic disease during today's visit Other  General Interventions   General Interventions Discussed/Reviewed General Interventions Reviewed, Doctor Visits, Communication with, Durable Medical Equipment (DME)  [provided niece with email address to [facility] provided by Leighton Parody wrote the email address and repeated it to RN CM]  Doctor Visits Discussed/Reviewed Doctor Visits Reviewed, PCP, Specialist  [Discussed Access to Local Gi MD for symptoms of abdominal pain at intervals reported by niece from last visit with patient]  Durable Medical Equipment (DME) Other  [hearing aid obtained]  PCP/Specialist Visits Compliance with follow-up visit  Communication with PCP/Specialists, RN  [to check with pcp about medicine list If obtained from facility,]  Education Interventions   Education Provided Provided Education, Provided Web-based Education  [my chart access, medical records for MD visits, GI MD services, abdominal pain]  Provided Verbal Education On When to see the doctor, Community Resources  Mental Health Interventions   Mental Health Discussed/Reviewed Mental Health Reviewed, Coping Strategies  [reported in  better spirits]  Pharmacy Interventions   Pharmacy Dicussed/Reviewed Pharmacy Topics Reviewed, Medication Adherence  Medication Adherence Not taking medication  [assessed for reconciliation completion- Niece unsure of completion. Confirmed facility is not sending records with patient to MD visits]  Safety Interventions   Safety Discussed/Reviewed Safety Reviewed, Fall Risk, Home Safety  Home Safety Assistive Devices              Our next appointment is by telephone on 07/03/23 at 1100  Please call the care guide team at [phone] if you need to cancel or reschedule your appointment.   If you are experiencing a Mental Health or Behavioral Health Crisis or need someone to talk to, please call the Suicide and Crisis Lifeline: 988 call the [place] National Suicide Prevention Lifeline: 250 633 0486 or TTY: 818-023-5296 TTY 726-766-9766) to talk to a trained counselor call 1-800-273-TALK (toll free, 24 hour hotline) call the [facility]: 226-599-0454 call 911   Patient verbalizes understanding of instructions and care plan provided today and agrees to view in MyChart. Active MyChart status and patient understanding of how to access instructions and care plan via MyChart confirmed with patient.     The patient has been provided with contact information for the care management team and has been advised to call with any health related questions or concerns.    Michelena Culmer L. Noelle Penner, RN, BSN, CCM [place]  [facility], [facility] RN Care Manager Direct Dial: [phone]  Fax: [phone]

## 2023-06-16 DIAGNOSIS — F330 Major depressive disorder, recurrent, mild: Secondary | ICD-10-CM | POA: Diagnosis not present

## 2023-06-17 DIAGNOSIS — F32A Depression, unspecified: Secondary | ICD-10-CM | POA: Diagnosis not present

## 2023-06-17 DIAGNOSIS — I5030 Unspecified diastolic (congestive) heart failure: Secondary | ICD-10-CM | POA: Diagnosis not present

## 2023-06-17 DIAGNOSIS — I110 Hypertensive heart disease with heart failure: Secondary | ICD-10-CM | POA: Diagnosis not present

## 2023-06-17 DIAGNOSIS — F0394 Unspecified dementia, unspecified severity, with anxiety: Secondary | ICD-10-CM | POA: Diagnosis not present

## 2023-06-17 DIAGNOSIS — Z9981 Dependence on supplemental oxygen: Secondary | ICD-10-CM | POA: Diagnosis not present

## 2023-06-17 DIAGNOSIS — J449 Chronic obstructive pulmonary disease, unspecified: Secondary | ICD-10-CM | POA: Diagnosis not present

## 2023-06-17 DIAGNOSIS — F0393 Unspecified dementia, unspecified severity, with mood disturbance: Secondary | ICD-10-CM | POA: Diagnosis not present

## 2023-06-17 DIAGNOSIS — I4821 Permanent atrial fibrillation: Secondary | ICD-10-CM | POA: Diagnosis not present

## 2023-06-17 DIAGNOSIS — Z7901 Long term (current) use of anticoagulants: Secondary | ICD-10-CM | POA: Diagnosis not present

## 2023-06-17 DIAGNOSIS — Z87891 Personal history of nicotine dependence: Secondary | ICD-10-CM | POA: Diagnosis not present

## 2023-06-17 DIAGNOSIS — Z9181 History of falling: Secondary | ICD-10-CM | POA: Diagnosis not present

## 2023-06-19 DIAGNOSIS — F419 Anxiety disorder, unspecified: Secondary | ICD-10-CM | POA: Diagnosis not present

## 2023-06-19 DIAGNOSIS — F3341 Major depressive disorder, recurrent, in partial remission: Secondary | ICD-10-CM | POA: Diagnosis not present

## 2023-06-19 DIAGNOSIS — F03A0 Unspecified dementia, mild, without behavioral disturbance, psychotic disturbance, mood disturbance, and anxiety: Secondary | ICD-10-CM | POA: Diagnosis not present

## 2023-06-20 DIAGNOSIS — J449 Chronic obstructive pulmonary disease, unspecified: Secondary | ICD-10-CM | POA: Diagnosis not present

## 2023-06-25 DIAGNOSIS — F330 Major depressive disorder, recurrent, mild: Secondary | ICD-10-CM | POA: Diagnosis not present

## 2023-07-02 DIAGNOSIS — F419 Anxiety disorder, unspecified: Secondary | ICD-10-CM | POA: Diagnosis not present

## 2023-07-02 DIAGNOSIS — F3341 Major depressive disorder, recurrent, in partial remission: Secondary | ICD-10-CM | POA: Diagnosis not present

## 2023-07-02 DIAGNOSIS — F330 Major depressive disorder, recurrent, mild: Secondary | ICD-10-CM | POA: Diagnosis not present

## 2023-07-03 ENCOUNTER — Ambulatory Visit: Payer: Self-pay | Admitting: *Deleted

## 2023-07-03 DIAGNOSIS — F03A0 Unspecified dementia, mild, without behavioral disturbance, psychotic disturbance, mood disturbance, and anxiety: Secondary | ICD-10-CM | POA: Diagnosis not present

## 2023-07-03 DIAGNOSIS — F419 Anxiety disorder, unspecified: Secondary | ICD-10-CM | POA: Diagnosis not present

## 2023-07-03 DIAGNOSIS — F3341 Major depressive disorder, recurrent, in partial remission: Secondary | ICD-10-CM | POA: Diagnosis not present

## 2023-07-04 ENCOUNTER — Encounter: Payer: Self-pay | Admitting: *Deleted

## 2023-07-04 ENCOUNTER — Other Ambulatory Visit: Payer: Self-pay

## 2023-07-04 NOTE — Patient Instructions (Signed)
 Visit Information  Thank you for taking time to visit with me today. Please don't hesitate to contact me if I can be of assistance to you before our next scheduled appointment.  Your next care management appointment is by telephone on 09/02/23 at 3 pm  [place] continue to take your medications being offered by your assisted living staff. Those not being offered should be reported to your pcp   Please call the care guide team at [phone] if you need to cancel, schedule, or reschedule an appointment.   Please call the Suicide and Crisis Lifeline: 988 call the [place]  National Suicide Prevention Lifeline: (507)615-9167 or TTY: 336-734-5960 TTY (937)651-8559) to talk to a trained counselor call 1-800-273-TALK (toll free, 24 hour hotline) call the [facility]: (610)393-8995 call 911 if you are experiencing a Mental Health or Behavioral Health Crisis or need someone to talk to.  Amanda Palmer L. Mcarthur Speedy, RN, BSN, CCM [place]  [facility], [facility] RN Care Manager Direct Dial: [phone]  Fax: [phone]

## 2023-07-04 NOTE — Patient Outreach (Signed)
 Complex Care Management   Visit Note  07/04/2023  Name:  Amanda Palmer MRN: [id number] DOB: 03-16-1933  Situation: Referral received for Complex Care Management related to COPD and hypertension  I obtained verbal consent from Caregiver.  Visit completed with Oralee Billow, Niece   on the phone  Background:   Past Medical History:  Diagnosis Date   A-fib [facility])    Anxiety    Arthritis    Cancer (HCC)    breast   COPD (chronic obstructive pulmonary disease) (HCC)    Depression    GERD (gastroesophageal reflux disease)    Hypertension    TIA (transient ischemic attack)    remote    Assessment: Patient Reported Symptoms:  Cognitive Cognitive Status: Able to follow simple commands, Normal speech and language skills, Alert and oriented to person, place, and time Cognitive/Intellectual Conditions Management [RPT]: None reported or documented in medical history or problem list   Healing Pattern: Average Health Facilitated by: Rest  Neurological      HEENT   HEENT Conditions: Vision problem(s), Ear problem(s) Vision problem(s), Ear problem(s)  Cardiovascular Cardiovascular Symptoms Reported: No symptoms reported Cardiovascular Conditions: Hypertension, Dysrhythmia, Coronary artery disease, Heart failure, High blood cholesterol Cardiovascular Management Strategies: Medication therapy, Routine screening, Diet modification, Fluid modification, Adequate rest, Medical device Cardiovascular Self-Management Outcome: 4 (good)  Respiratory Respiratory Symptoms Reported: No symptoms reported, Other: Additional Respiratory Details: reports concern that trelegy & albuterol  are not available for her at [facility] assisted living Respiratory Conditions: COPD Respiratory Self-Management Outcome: 3 (uncertain)  Endocrine Patient reports the following symptoms related to hypoglycemia or hyperglycemia : No symptoms reported Is patient diabetic?: No Endocrine Conditions:  (none) Endocrine  Management Strategies: Adequate rest, Diet modification, Routine screening Endocrine Self-Management Outcome: 4 (good)  Gastrointestinal Gastrointestinal Symptoms Reported: Nausea Gastrointestinal Conditions: Nausea, Constipation, Reflux/heartburn, Other Other Gastrointestinal Conditions: dysphagia Gastrointestinal Management Strategies: Adequate rest, Diet modification, Fluid modification, Medication therapy Gastrointestinal Self-Management Outcome: 4 (good) Nutrition Risk Screen (CP): No indicators present  Genitourinary Genitourinary Symptoms Reported: Incontinence Genitourinary Conditions: Frequency, Incontinence Genitourinary Management Strategies: Adequate rest, Diet modification, Incontinence garment/pad Genitourinary Self-Management Outcome: 4 (good)  Integumentary Integumentary Symptoms Reported: No symptoms reported Skin Conditions: Other Other Skin Conditions: skin easy to bruise Skin Management Strategies: Adequate rest, Diet modification, Fluid modification, Routine screening Skin Self-Management Outcome: 4 (good)  Musculoskeletal Musculoskelatal Symptoms Reviewed: Difficulty walking, Weakness, Unsteady gait Musculoskeletal Conditions: Fracture, Joint pain, Mobility limited, Unsteady gait Musculoskeletal Management Strategies: Adequate rest, Routine screening Musculoskeletal Self-Management Outcome: 4 (good) Falls in the past year?: Yes Number of falls in past year: 2 or more Patient at Risk for Falls Due to: History of fall(s), Impaired balance/gait, Mental status change, Impaired mobility, Impaired vision Fall risk Follow up: Falls evaluation completed  Psychosocial Psychosocial Symptoms Reported: Alteration in eating habits Additional Psychological Details: not preferring lots of the food Behavioral Health Conditions: Anxiety, Depression, Other Other Behavorial Health Conditions: memory changes Behavioral Management Strategies: Adequate rest, Coping strategies, Medication  therapy, Support system Behavioral Health Self-Management Outcome: 4 (good) Major Change/Loss/Stressor/Fears (CP): Medical condition, self Techniques to Cope with Loss/Stress/Change: Withdraw, Medication Quality of Family Relationships: helpful, involved, supportive Do you feel physically threatened by others?: No      07/04/2023    9:24 AM  Depression screen PHQ 2/9  Decreased Interest 1  Down, Depressed, Hopeless 0  PHQ - 2 Score 1    There were no vitals filed for this visit.  Medications Reviewed Today     Reviewed by Mcarthur Speedy,  Lynford Sarin, RN (Registered Nurse) on 07/04/23 at [phone]  Med List Status: <None>   Medication Order Taking? Sig Documenting Provider Last Dose Status Informant  acetaminophen  (TYLENOL ) 500 MG tablet [id number] Yes Take 500 mg by mouth every 6 (six) hours as needed for moderate pain. [provider] Taking Active Family Member  albuterol  (PROVENTIL  HFA;VENTOLIN  HFA) [phone] Base) MCG/ACT inhaler [id number] Yes Inhale 2 puffs into the lungs every 6 (six) hours as needed for wheezing or shortness of breath. [provider] Taking Active Family Member  albuterol  (PROVENTIL ) (2.5 MG/3ML) 0.083% nebulizer solution [id number] Yes Take 2.5 mg by nebulization in the morning and at bedtime. [provider] Taking Active Family Member  amiodarone  (PACERONE ) 200 MG tablet [id number] Yes TAKE ONE TABLET BY MOUTH ONCE DAILY Branch, Joyceann No, MD Taking Active   apixaban  (ELIQUIS ) 2.5 MG TABS tablet [id number] Yes Take 1 tablet (2.5 mg total) by mouth 2 (two) times daily. Mealor, Augustus E, MD Taking Active   atorvastatin  (LIPITOR) 20 MG tablet [id number] Yes Take 20 mg by mouth at bedtime. [provider] Taking Active Family Member  benzonatate (TESSALON) 100 MG capsule [id number] Yes Take 100 mg by mouth 3 (three) times daily as needed for cough (take 1 capsule (100 mg) by mouth 3 times a day as needed for cough for up to 7 days). [provider] Taking Active   clindamycin (CLEOCIN) 300 MG capsule [id number] No Take 300 mg by mouth daily.  Patient not taking: Reported on 07/03/2023   [provider] Not Taking Active   clonazePAM  (KLONOPIN ) 0.5 MG tablet [id number] Yes Take 0.5 mg by mouth 2 (two) times daily as needed. [provider] Taking Active   cyanocobalamin 100 MCG tablet [id number] Yes Take 100 mcg by mouth daily. Kopel, Amanda L, PA Taking Active   esomeprazole (NEXIUM) 20 MG capsule [id number] Yes Take 20 mg by mouth daily. [provider] Taking Active Family Member  famotidine  (PEPCID ) 20 MG tablet [id number] Yes Take 20 mg by mouth at bedtime. [provider] Taking Active            Med Note Mcarthur Speedy, [facility]   Tue May 06, 2023  5:14 PM) Taking pepcid  10 mg daily not 20 mg  ferrous sulfate 325 (65 FE) MG EC tablet [id number] No Take 325 mg by mouth daily.  Patient not taking: Reported on 07/03/2023   Kopel, Amanda L, [place] Not Taking Active   fluticasone  (FLONASE ) 50 MCG/ACT nasal spray [id number] Yes Place 1 spray into both nostrils daily. Sood, Vineet, MD Taking Active   furosemide  (LASIX ) 20 MG tablet [id number] Yes Take 1 tablet (20 mg total) by mouth as needed for edema (swelling). Laurann Pollock, MD Taking Active Family Member  gabapentin  (NEURONTIN ) 300 MG capsule [id number] Yes Take 300 mg by mouth at bedtime. 1 capsule nightly for neuropathy Kopel, Amanda L, PA Taking Active   guaifenesin  (ROBITUSSIN) 100 MG/5ML syrup [id number] Yes Take 100 mg by mouth 3 (three) times daily as needed for cough. [provider] Taking Active Family Member  hydrALAZINE  (APRESOLINE ) 25 MG tablet [id number] Yes Take 25 mg by mouth 3 (three) times daily. Kopel, Amanda L, PA Taking Active   hydrocortisone  (ANUSOL -HC) 2.5 % rectal cream [id number] Yes Place 1 Application rectally 2 (two) times daily. Catherne Clubs, PA-C Taking Active Family Member  levalbuterol  (XOPENEX ) 0.63 MG/3ML  nebulizer solution [id number] Yes Inhale 0.63 mg into the lungs. [provider] Taking Active  losartan  (COZAAR ) 25 MG tablet [id number] Yes Take 1.5 tablets (37.5 mg total) by mouth daily. Laurann Pollock, MD Taking Active            Med Note Mcarthur Speedy, [facility]   Tue May 06, 2023  5:16 PM) Increased from 25 mg to 100 mg on 04/03/23  Magnesium  Oxide 400 MG CAPS [id number] Yes Take 1 capsule (400 mg total) by mouth daily. Tylene Galla, PA-C Taking Active Family Member  metoprolol  succinate (TOPROL  XL) 50 MG 24 hr tablet [id number] Yes Take 1 tablet (50 mg total) by mouth daily. Take with or immediately following a meal. Fenton, Clint R, PA Taking Active Family Member           Med Note Mcarthur Speedy, [facility]   Tue May 06, 2023  5:17 PM) 04/03/23 decreased from 50 to 12.5 mg twice a day  metoprolol  tartrate (LOPRESSOR ) 25 MG tablet [id number] Yes Take 12.5 mg by mouth 2 (two) times daily. [provider] Taking Active   Multiple Vitamin (MULTIVITAMIN WITH MINERALS) TABS tablet [id number] Yes Take 1 tablet by mouth daily. [provider] Taking Active Family Member  ondansetron  (ZOFRAN ) 4 MG tablet [id number] Yes Take 4 mg by mouth every 8 (eight) hours as needed for nausea or vomiting. Take 1-2 tablets (4-8 mg) by mouth every eight hours as needed for up to 16 doses Kopel, Amanda L, [place] Taking Active   OXYGEN  [id number] Yes Inhale 2 L into the lungs continuous. [provider] Taking Active Family Member  pantoprazole  (PROTONIX ) 40 MG tablet [id number] Yes Take 40 mg by mouth daily. Take 1 tablet by mouth daily before breakfast Kopel, Amanda L, PA Taking Active   potassium chloride  (KLOR-CON ) 10 MEQ tablet [id number] Yes TAKE TWO TABLETS BY MOUTH ONCE DAILY Tylene Galla, PA-C Taking Active   Respiratory Therapy Supplies (FLUTTER) DEVI [id number] Yes 1 Device by Does not apply route in the morning, at noon, in the evening, and at bedtime. Josiah Nigh, MD  Taking Active Family Member  sennosides-docusate sodium (SENOKOT-S) 8.6-50 MG tablet [id number] Yes Take 1 tablet by mouth at bedtime. Kopel, Amanda L, PA Taking Active   thiamine (VITAMIN B-1) 100 MG tablet [id number] No Take 100 mg by mouth daily.  Patient not taking: Reported on 07/03/2023   Kopel, Amanda L, [place] Not Taking Active   TRELEGY ELLIPTA  100-62.5-25 MCG/ACT AEPB [id number] Yes INHALE ONE (1) PUFF BY MOUTH ONCE DAILY *REFILL REQUEST* Sood, Vineet, MD Taking Active   triamcinolone cream (KENALOG) 0.5 % [id number] Yes Apply 1 Application topically in the morning and at bedtime. [provider] Taking Active Family Member  valsartan (DIOVAN) 160 MG tablet [id number]  Take 160 mg by mouth daily. [provider]  Expired 05/22/23 2359   venlafaxine  (EFFEXOR ) 75 MG tablet [id number] Yes Take 75-150 mg by mouth 2 (two) times daily with a meal. Takes 2 tablets (150 mg) by mouth in the morning and take 1 tablet (75 mg) by mouth at night [provider] Taking Active Family Member           Med Note Julianna Ochs   Sat May 21, 2014 10:30 PM)              Recommendation:   PCP Follow-up Continue to review medications ordered with [facility] assisted living to assist with Mrs Goon correct medication administration. Continue to encouraged blood pressure checks   Follow Up Plan:   Telephone follow up appointment date/time:  09/02/23 3 pm  Marqui Formby L. Mcarthur Speedy, RN, BSN, CCM [place]  [facility], [facility] RN Care Manager Direct Dial: [phone]  Fax: [phone]

## 2023-07-07 DIAGNOSIS — K449 Diaphragmatic hernia without obstruction or gangrene: Secondary | ICD-10-CM | POA: Insufficient documentation

## 2023-07-07 DIAGNOSIS — M810 Age-related osteoporosis without current pathological fracture: Secondary | ICD-10-CM | POA: Insufficient documentation

## 2023-07-07 DIAGNOSIS — K644 Residual hemorrhoidal skin tags: Secondary | ICD-10-CM | POA: Insufficient documentation

## 2023-07-07 DIAGNOSIS — R197 Diarrhea, unspecified: Secondary | ICD-10-CM | POA: Insufficient documentation

## 2023-07-07 DIAGNOSIS — R296 Repeated falls: Secondary | ICD-10-CM | POA: Insufficient documentation

## 2023-07-07 DIAGNOSIS — I700 Atherosclerosis of aorta: Secondary | ICD-10-CM | POA: Insufficient documentation

## 2023-07-07 DIAGNOSIS — G629 Polyneuropathy, unspecified: Secondary | ICD-10-CM | POA: Insufficient documentation

## 2023-07-07 DIAGNOSIS — E1165 Type 2 diabetes mellitus with hyperglycemia: Secondary | ICD-10-CM | POA: Insufficient documentation

## 2023-07-07 DIAGNOSIS — R748 Abnormal levels of other serum enzymes: Secondary | ICD-10-CM | POA: Insufficient documentation

## 2023-07-07 DIAGNOSIS — H60549 Acute eczematoid otitis externa, unspecified ear: Secondary | ICD-10-CM | POA: Insufficient documentation

## 2023-07-07 DIAGNOSIS — M545 Low back pain, unspecified: Secondary | ICD-10-CM | POA: Insufficient documentation

## 2023-07-07 DIAGNOSIS — M19012 Primary osteoarthritis, left shoulder: Secondary | ICD-10-CM | POA: Insufficient documentation

## 2023-07-07 DIAGNOSIS — D6859 Other primary thrombophilia: Secondary | ICD-10-CM | POA: Insufficient documentation

## 2023-07-07 DIAGNOSIS — D509 Iron deficiency anemia, unspecified: Secondary | ICD-10-CM | POA: Insufficient documentation

## 2023-07-07 DIAGNOSIS — D649 Anemia, unspecified: Secondary | ICD-10-CM | POA: Insufficient documentation

## 2023-07-08 DIAGNOSIS — J449 Chronic obstructive pulmonary disease, unspecified: Secondary | ICD-10-CM | POA: Diagnosis not present

## 2023-07-09 DIAGNOSIS — F419 Anxiety disorder, unspecified: Secondary | ICD-10-CM | POA: Diagnosis not present

## 2023-07-09 DIAGNOSIS — F330 Major depressive disorder, recurrent, mild: Secondary | ICD-10-CM | POA: Diagnosis not present

## 2023-07-16 ENCOUNTER — Other Ambulatory Visit (INDEPENDENT_AMBULATORY_CARE_PROVIDER_SITE_OTHER): Payer: Self-pay

## 2023-07-16 ENCOUNTER — Encounter: Payer: Self-pay | Admitting: Orthopedic Surgery

## 2023-07-16 ENCOUNTER — Ambulatory Visit: Admitting: Orthopedic Surgery

## 2023-07-16 VITALS — BP 174/95 | HR 84 | Ht 60.0 in | Wt 126.0 lb

## 2023-07-16 DIAGNOSIS — L110 Acquired keratosis follicularis: Secondary | ICD-10-CM | POA: Diagnosis not present

## 2023-07-16 DIAGNOSIS — M1612 Unilateral primary osteoarthritis, left hip: Secondary | ICD-10-CM

## 2023-07-16 DIAGNOSIS — M79671 Pain in right foot: Secondary | ICD-10-CM | POA: Diagnosis not present

## 2023-07-16 DIAGNOSIS — M25552 Pain in left hip: Secondary | ICD-10-CM

## 2023-07-16 DIAGNOSIS — I739 Peripheral vascular disease, unspecified: Secondary | ICD-10-CM | POA: Diagnosis not present

## 2023-07-16 DIAGNOSIS — F330 Major depressive disorder, recurrent, mild: Secondary | ICD-10-CM | POA: Diagnosis not present

## 2023-07-16 DIAGNOSIS — M79672 Pain in left foot: Secondary | ICD-10-CM | POA: Diagnosis not present

## 2023-07-16 DIAGNOSIS — F419 Anxiety disorder, unspecified: Secondary | ICD-10-CM | POA: Diagnosis not present

## 2023-07-16 MED ORDER — TRAMADOL HCL 50 MG PO TABS: 50.0000 mg | ORAL_TABLET | Freq: Two times a day (BID) | ORAL | 0 refills | Status: DC | PRN

## 2023-07-16 NOTE — Patient Instructions (Signed)
 Provide a note for her facility  I have sent a prescription to her pharmacy for Tramadol .  This is a pain medicine that she can take every morning and night, as needed.   Please call with issues.

## 2023-07-16 NOTE — Progress Notes (Signed)
 Orthopaedic Clinic Return  Assessment: Amanda Palmer is a 89 y.o. female with the following: Left hip arthritis  Plan: Amanda Palmer has severe left hip arthritis.  I believe this is causing her pain.  She also has multiple medical comorbidities, so there would be risks associated with surgery.  She is not interested in left hip arthroplasty at this time.  I have recommended some tramadol , to be taken in the morning and at nighttime.  This was sent to her pharmacy.  Have also provided documentation for the facility, so they know what to expect for dosing.  If she is interested in an injection, she will contact the clinic.   Meds ordered this encounter  Medications   traMADol  (ULTRAM ) 50 MG tablet    Sig: Take 1 tablet (50 mg total) by mouth every 12 (twelve) hours as needed.    Dispense:  30 tablet    Refill:  0    Body mass index is 24.61 kg/m.  Follow-up: Return if symptoms worsen or fail to improve.   Subjective:  Chief Complaint  Patient presents with   Hip Pain    L side after a fall 3 wks ago pt states pain didn't start until a few days later. Has tried tylenol  with minor relief.     History of Present Illness: Amanda Palmer is a 89 y.o. female who returns to clinic for evaluation of left hip pain.  She is well-known to my clinic.  She has a lot of pain in the left groin, this is radiating distally.  She has a history of right hip arthroplasty, with similar type symptoms.  She also has bilateral total knee arthroplasty, surgery of both shoulders, as well as low back fusion.  Is a cane to assist with ambulation.  Review of Systems: No fevers or chills No numbness or tingling No chest pain No shortness of breath No bowel or bladder dysfunction No GI distress No headaches   Objective: BP (!) 174/95   Pulse 84   Ht 5' (1.524 m)   Wt 126 lb (57.2 kg)   BMI 24.61 kg/m   Physical Exam:  Elderly female.  Alert and oriented.  No acute distress.  Ambulating with a  cane.  Evaluation left hip demonstrates no deformity.  No redness.  No swelling.  Pain with internal and external rotation of the left hip.  Pain is within the groin.  She is able to maintain a straight leg raise.  Toes warm and well-perfused.  2+ DP pulse.  Sensation intact of the dorsum of the foot.  IMAGING: I personally ordered and reviewed the following images:   AP pelvis and left hip x-rays were obtained in clinic today.  No acute injuries are noted.  Advanced degenerative changes, with complete loss of joint space.  There are subchondral cysts in both the femoral head, as well as the acetabulum.  No bony lesions.  The views of the right hip demonstrates a well-positioned total hip arthroplasty without subsidence or lucency.  Impression: Severe left hip arthritis.   Tonita Frater, MD 07/16/2023 10:04 AM

## 2023-07-17 ENCOUNTER — Telehealth: Payer: Self-pay | Admitting: *Deleted

## 2023-07-17 NOTE — Patient Outreach (Signed)
 Medication reconciliation with Caren Channel (niece inquiry)  Outreach to [employer] with Trula Gable to review patient medication list at the facility compared to the pcp medication list Encouraged Trula Gable to fax a copy of the medicine list to pcp office ATTN Dr Lonzell Robin [phone]  Medications not listed on pcp list that is being offered at the facility Includes:  Dextrol liquid 10 mg syrup for cough, mirtazapine (depression) , mucinex , Hydrochlorothiazide    Medications not being taken at [facility] includes benzonatate (tessalon)  100 mg capsule, No further antibiotics, No clonazepam  (klonopin ), no Nexium (esomeprazole) 20 mg , no furosemide  (lasix ), no gabapentin  (Neurontin ), no Anusol  (hydrocortisone  rectal cream, no losartan  (cozaar ) 25 mg, No Toprol  Xl ( metoprolol  succinate), no Multivitamin, no potassium chloride , no respiratory flutter device,   Sent an email to [employer] office manager Thersia Flax & updated her that a facility medicine list should be faxed to the office per [employer] L. Mcarthur Speedy, RN, BSN, CCM [place]  [facility], [facility] RN Care Manager Direct Dial: [phone]  Fax: [phone]

## 2023-07-20 DIAGNOSIS — J449 Chronic obstructive pulmonary disease, unspecified: Secondary | ICD-10-CM | POA: Diagnosis not present

## 2023-07-22 ENCOUNTER — Other Ambulatory Visit: Payer: Self-pay | Admitting: Orthopedic Surgery

## 2023-07-23 DIAGNOSIS — F330 Major depressive disorder, recurrent, mild: Secondary | ICD-10-CM | POA: Diagnosis not present

## 2023-07-23 DIAGNOSIS — F419 Anxiety disorder, unspecified: Secondary | ICD-10-CM | POA: Diagnosis not present

## 2023-07-26 IMAGING — MG DIGITAL SCREENING UNILAT LEFT W/ TOMO W/ CAD
6 series · 6 of 18 positions shown · non-contrast
Comparison: Previous exam(s).

CLINICAL DATA: Screening.

EXAM:
DIGITAL SCREENING UNILATERAL LEFT MAMMOGRAM WITH CAD AND
TOMOSYNTHESIS
TECHNIQUE: Left screening digital craniocaudal and mediolateral oblique
mammograms were obtained. Left screening digital breast
tomosynthesis was performed. The images were evaluated with
computer-aided detection.

[L MLO synth-2D (1 of 2)]
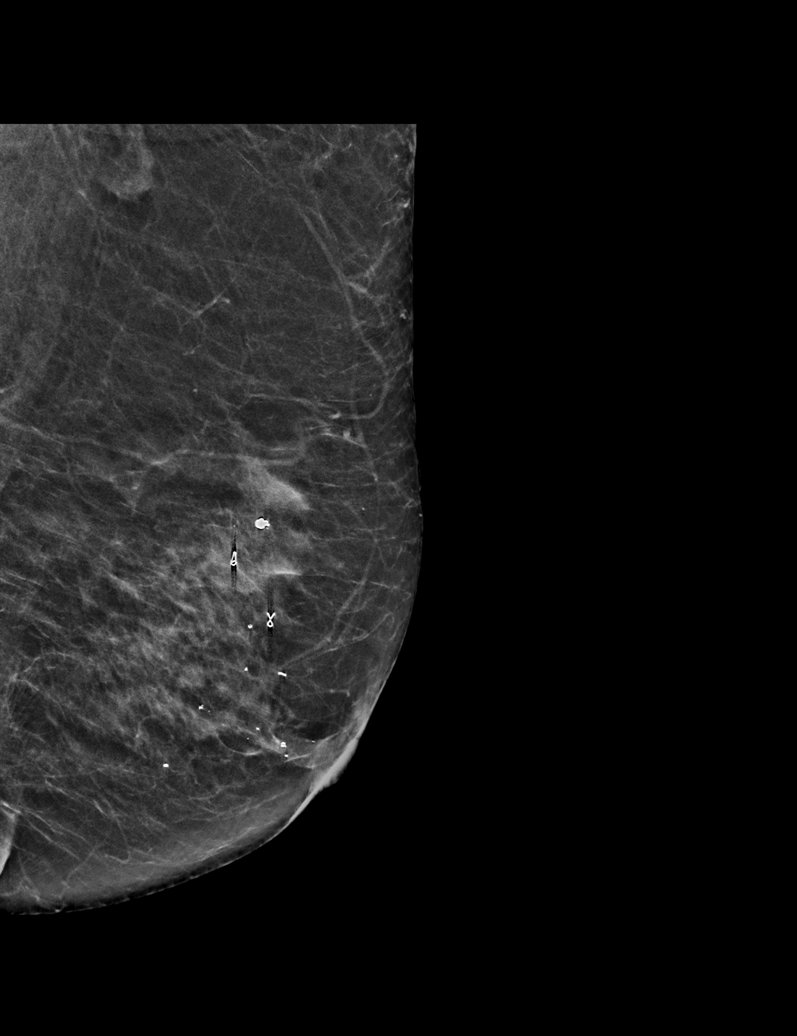

[L MLO synth-2D (2 of 2)]
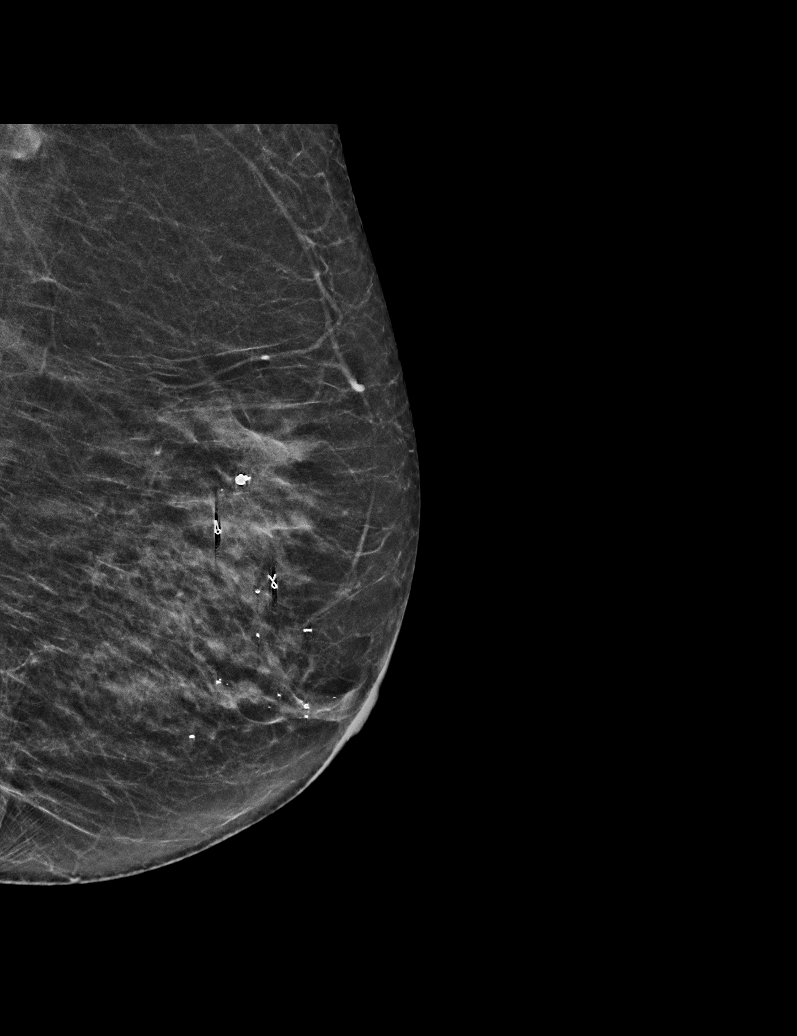

[L CC synth-2D]
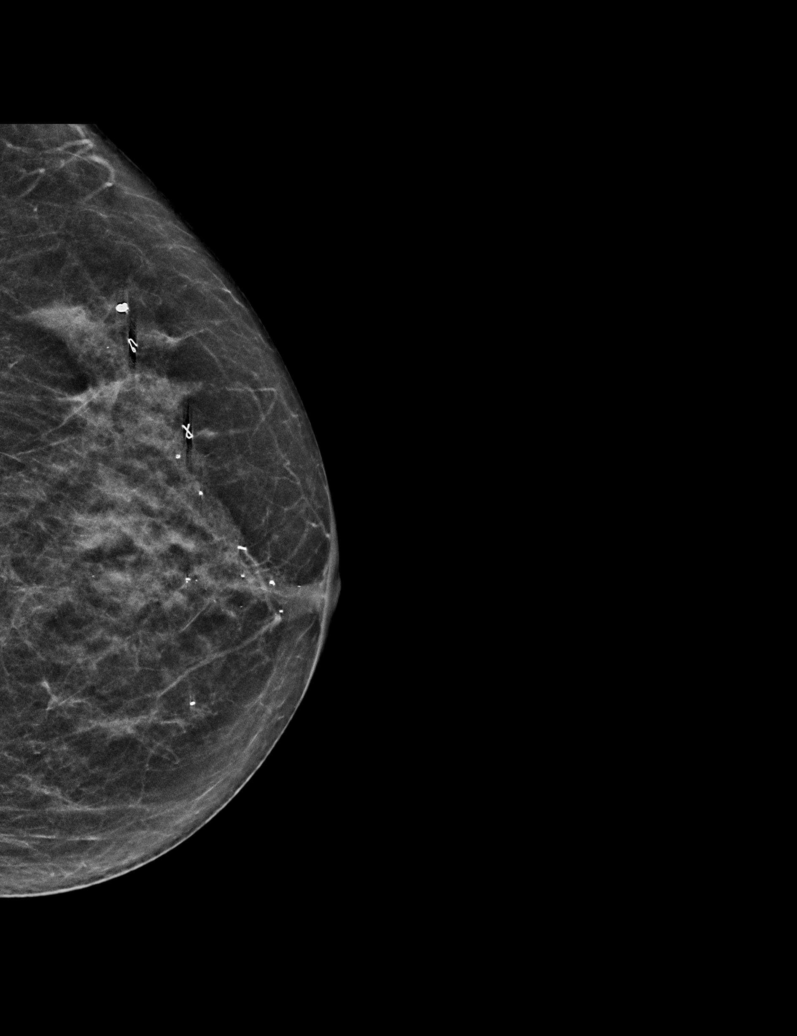

[L CC tomo · tomo slice 22/43.0]
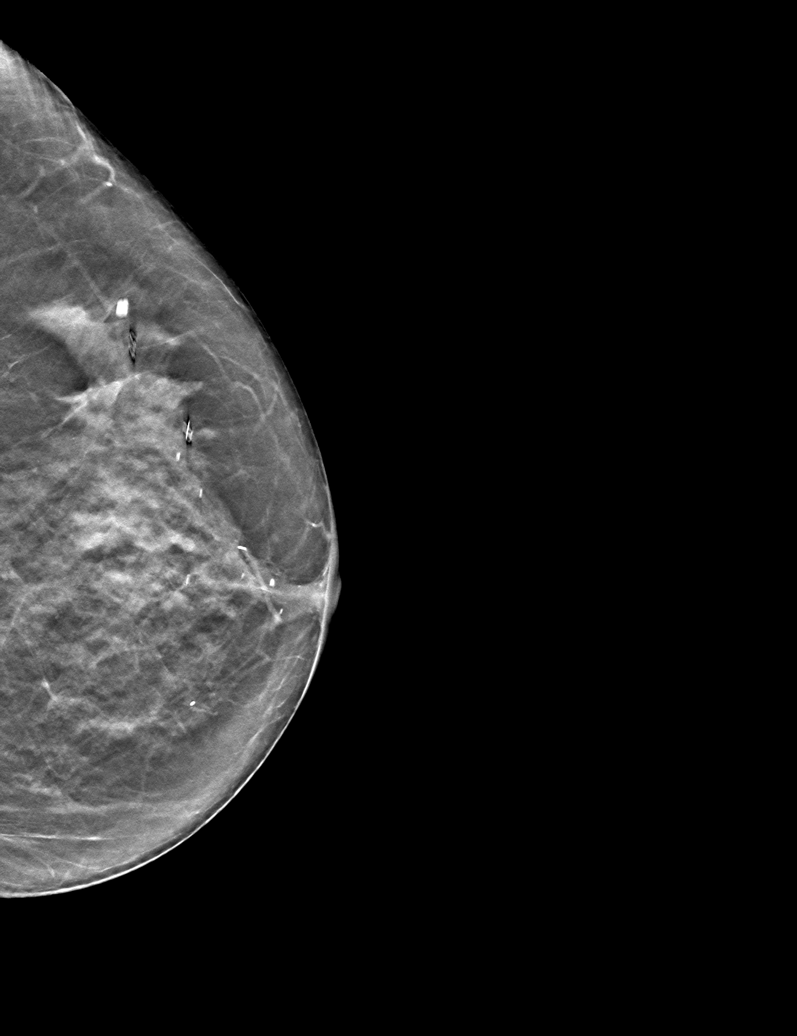

[L MLO tomo (1 of 2) · tomo slice 27/52.0]
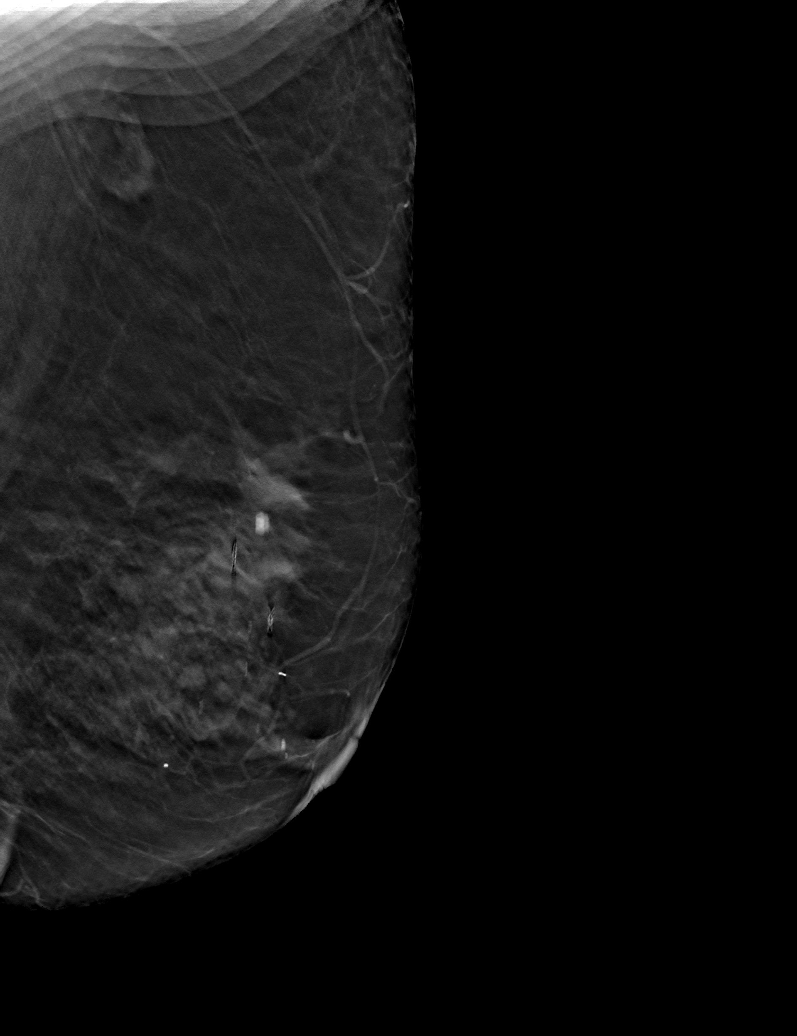

[L MLO tomo (2 of 2) · tomo slice 24/47.0]
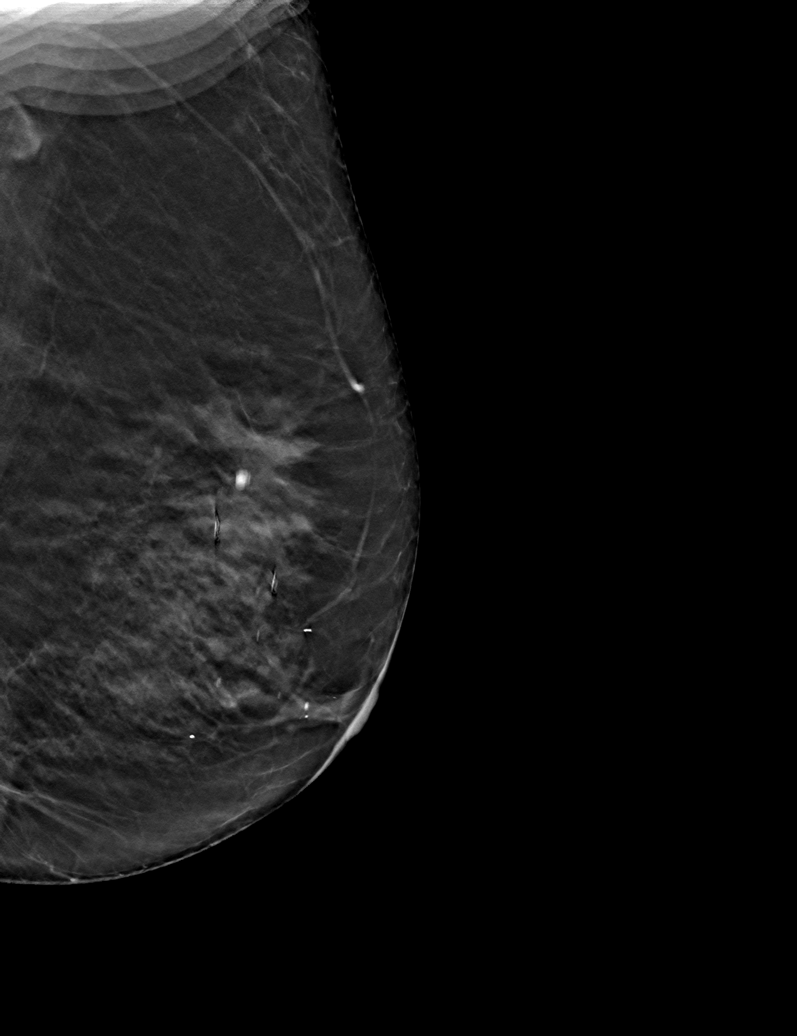

[6 of 18 positions shown; findings below may reference images not displayed]

ACR Breast Density Category c: The breast tissue is heterogeneously
dense, which may obscure small masses.
FINDINGS: The patient has had a right mastectomy. There are no findings
suspicious for malignancy.
IMPRESSION: No mammographic evidence of malignancy. A result letter of this
screening mammogram will be mailed directly to the patient.

RECOMMENDATION:
Screening mammogram in one year.  (Code:[id number]-[id number])

BI-RADS CATEGORY  1: Negative.

## 2023-07-30 ENCOUNTER — Other Ambulatory Visit: Payer: Self-pay | Admitting: Orthopedic Surgery

## 2023-07-30 DIAGNOSIS — I10 Essential (primary) hypertension: Secondary | ICD-10-CM | POA: Diagnosis not present

## 2023-07-30 DIAGNOSIS — K219 Gastro-esophageal reflux disease without esophagitis: Secondary | ICD-10-CM | POA: Diagnosis not present

## 2023-07-30 DIAGNOSIS — F419 Anxiety disorder, unspecified: Secondary | ICD-10-CM | POA: Diagnosis not present

## 2023-07-30 DIAGNOSIS — F3341 Major depressive disorder, recurrent, in partial remission: Secondary | ICD-10-CM | POA: Diagnosis not present

## 2023-07-30 DIAGNOSIS — M818 Other osteoporosis without current pathological fracture: Secondary | ICD-10-CM | POA: Diagnosis not present

## 2023-07-30 DIAGNOSIS — I482 Chronic atrial fibrillation, unspecified: Secondary | ICD-10-CM | POA: Diagnosis not present

## 2023-07-30 DIAGNOSIS — I420 Dilated cardiomyopathy: Secondary | ICD-10-CM | POA: Diagnosis not present

## 2023-07-30 DIAGNOSIS — R4189 Other symptoms and signs involving cognitive functions and awareness: Secondary | ICD-10-CM | POA: Diagnosis not present

## 2023-07-30 DIAGNOSIS — I739 Peripheral vascular disease, unspecified: Secondary | ICD-10-CM | POA: Diagnosis not present

## 2023-07-30 DIAGNOSIS — Z9181 History of falling: Secondary | ICD-10-CM | POA: Diagnosis not present

## 2023-07-30 DIAGNOSIS — D509 Iron deficiency anemia, unspecified: Secondary | ICD-10-CM | POA: Diagnosis not present

## 2023-07-30 DIAGNOSIS — G629 Polyneuropathy, unspecified: Secondary | ICD-10-CM | POA: Diagnosis not present

## 2023-07-30 DIAGNOSIS — J449 Chronic obstructive pulmonary disease, unspecified: Secondary | ICD-10-CM | POA: Diagnosis not present

## 2023-07-30 DIAGNOSIS — F330 Major depressive disorder, recurrent, mild: Secondary | ICD-10-CM | POA: Diagnosis not present

## 2023-07-31 DIAGNOSIS — F3341 Major depressive disorder, recurrent, in partial remission: Secondary | ICD-10-CM | POA: Diagnosis not present

## 2023-07-31 DIAGNOSIS — F03A0 Unspecified dementia, mild, without behavioral disturbance, psychotic disturbance, mood disturbance, and anxiety: Secondary | ICD-10-CM | POA: Diagnosis not present

## 2023-07-31 DIAGNOSIS — F419 Anxiety disorder, unspecified: Secondary | ICD-10-CM | POA: Diagnosis not present

## 2023-08-01 DIAGNOSIS — S2232XA Fracture of one rib, left side, initial encounter for closed fracture: Secondary | ICD-10-CM | POA: Diagnosis not present

## 2023-08-01 DIAGNOSIS — S8992XA Unspecified injury of left lower leg, initial encounter: Secondary | ICD-10-CM | POA: Diagnosis not present

## 2023-08-01 DIAGNOSIS — M25561 Pain in right knee: Secondary | ICD-10-CM | POA: Diagnosis not present

## 2023-08-01 DIAGNOSIS — S0990XA Unspecified injury of head, initial encounter: Secondary | ICD-10-CM | POA: Diagnosis not present

## 2023-08-01 DIAGNOSIS — S43082A Other subluxation of left shoulder joint, initial encounter: Secondary | ICD-10-CM | POA: Diagnosis not present

## 2023-08-01 DIAGNOSIS — S8991XA Unspecified injury of right lower leg, initial encounter: Secondary | ICD-10-CM | POA: Diagnosis not present

## 2023-08-01 DIAGNOSIS — S51012A Laceration without foreign body of left elbow, initial encounter: Secondary | ICD-10-CM | POA: Diagnosis not present

## 2023-08-01 DIAGNOSIS — I10 Essential (primary) hypertension: Secondary | ICD-10-CM | POA: Diagnosis not present

## 2023-08-01 DIAGNOSIS — M25562 Pain in left knee: Secondary | ICD-10-CM | POA: Diagnosis not present

## 2023-08-01 DIAGNOSIS — M19012 Primary osteoarthritis, left shoulder: Secondary | ICD-10-CM | POA: Diagnosis not present

## 2023-08-01 DIAGNOSIS — M25512 Pain in left shoulder: Secondary | ICD-10-CM | POA: Diagnosis not present

## 2023-08-01 DIAGNOSIS — I6782 Cerebral ischemia: Secondary | ICD-10-CM | POA: Diagnosis not present

## 2023-08-01 DIAGNOSIS — M25522 Pain in left elbow: Secondary | ICD-10-CM | POA: Diagnosis not present

## 2023-08-01 DIAGNOSIS — M25519 Pain in unspecified shoulder: Secondary | ICD-10-CM | POA: Diagnosis not present

## 2023-08-01 DIAGNOSIS — S0083XA Contusion of other part of head, initial encounter: Secondary | ICD-10-CM | POA: Diagnosis not present

## 2023-08-01 DIAGNOSIS — S8001XA Contusion of right knee, initial encounter: Secondary | ICD-10-CM | POA: Diagnosis not present

## 2023-08-05 ENCOUNTER — Encounter: Payer: Self-pay | Admitting: Cardiology

## 2023-08-05 ENCOUNTER — Ambulatory Visit: Payer: Medicare HMO | Attending: Cardiology | Admitting: Cardiology

## 2023-08-05 ENCOUNTER — Telehealth: Payer: Self-pay | Admitting: Orthopedic Surgery

## 2023-08-05 VITALS — BP 142/80 | HR 72 | Ht 60.0 in | Wt 127.6 lb

## 2023-08-05 DIAGNOSIS — I5032 Chronic diastolic (congestive) heart failure: Secondary | ICD-10-CM

## 2023-08-05 DIAGNOSIS — I480 Paroxysmal atrial fibrillation: Secondary | ICD-10-CM

## 2023-08-05 DIAGNOSIS — I10 Essential (primary) hypertension: Secondary | ICD-10-CM

## 2023-08-05 DIAGNOSIS — D6869 Other thrombophilia: Secondary | ICD-10-CM

## 2023-08-05 NOTE — Patient Instructions (Addendum)
 Medication Instructions:   Continue all current medications.   Labwork:  none  Testing/Procedures:  none  Follow-Up:  6 months   Any Other Special Instructions Will Be Listed Below (If Applicable).  PLEASE SEND MAR WITH PATIENT TO NEXT OFFICE VISIT   If you need a refill on your cardiac medications before your next appointment, please call your pharmacy.

## 2023-08-05 NOTE — Progress Notes (Signed)
 Clinical Summary Ms. Dena is a 89 y.o.female seen today for follow up of the following medical problems.      1. Afib/acquired thrombophilia - had been on dofetilide , change to amio due to long QT.From EP note not good ablation candidate.   05/2022 monitor Sinus rhythm rate 51-94, avg 63 There was one 17 beat run of SVT at 118 bpm   - fall last Thursday while in bathroom. No specific lightheadedness or dizziness, no LOC.  - she reports about 3 falls over the last 5 months. -  rare palpitstions.      2. Chronic systolic HF - 12/2019 TTE: LVEF 60-65% - 05/2020 TEE: LVEF 30-35%. This was around the time she had issues with afib with RVR - medical therapy limited by low bp's  09/2020 echo LVEF 60-65% - Jan 2024 echo: LVEF 60-65%, grade II dd  - chronic SOB, she has COPD and is fairly sedentary.        3. HTN - orthsostatic dizziness, have tolerated higher bp's for her.  - compliant with meds     4. Hyperlpidemia - on statin  09/2021 TC 132 TG 109 HDL 61 LDL 51 02/2022 TC 164 LDL 67 TG 80 HDL 80 - 08/2022 TC 147 TG 98 HDL 67 LDL 62   5. SOB/Chronic lung disease - on home O2, followed by pulmonary     6. Vocal cord dysfunction     6. Valvular heart disease - 05/2020 TEE mod to severe, mod MR, LVEF 30-35% 09/2020 trivial MR, normalized EF   - apepars to have been funcitonal MR that has resolved.         Sister of Melene Sportsman, former patient of mine who passed way in 2020 Past Medical History:  Diagnosis Date   A-fib [facility])    Anxiety    Arthritis    Cancer (HCC)    breast   COPD (chronic obstructive pulmonary disease) (HCC)    Depression    GERD (gastroesophageal reflux disease)    Hypertension    TIA (transient ischemic attack)    remote     Allergies  Allergen Reactions   Morphine  Nausea And Vomiting    Other Reaction(s): GI Intolerance   Morphine  And Codeine Nausea And Vomiting   Penicillins Rash    Reaction: unknown   Prednisone  Anxiety,  Nausea And Vomiting and Other (See Comments)    Makes patient not in right state of mind  Other Reaction(s): GI Intolerance  Makes patient not in right state of mind  Other Reaction(s): GI Intolerance  Makes patient not in right state of mind  Other Reaction(s): GI Intolerance    Makes patient not in right state of mind  Other Reaction(s): GI Intolerance  Makes patient not in right state of mind  Other Reaction(s): GI Intolerance   Sulfa Antibiotics Nausea And Vomiting   Sulfamethoxazole Nausea And Vomiting    Other Reaction(s): GI Intolerance     Current Outpatient Medications  Medication Sig Dispense Refill   acetaminophen  (TYLENOL ) 500 MG tablet Take 500 mg by mouth every 6 (six) hours as needed for moderate pain.     albuterol  (PROVENTIL  HFA;VENTOLIN  HFA) 108 (90 Base) MCG/ACT inhaler Inhale 2 puffs into the lungs every 6 (six) hours as needed for wheezing or shortness of breath.     albuterol  (PROVENTIL ) (2.5 MG/3ML) 0.083% nebulizer solution Take 2.5 mg by nebulization in the morning and at bedtime.     amiodarone  (PACERONE ) 200  MG tablet TAKE ONE TABLET BY MOUTH ONCE DAILY 30 tablet 5   apixaban  (ELIQUIS ) 2.5 MG TABS tablet Take 1 tablet (2.5 mg total) by mouth 2 (two) times daily. 60 tablet 6   atorvastatin  (LIPITOR) 20 MG tablet Take 20 mg by mouth at bedtime.     benzonatate (TESSALON) 100 MG capsule Take 100 mg by mouth 3 (three) times daily as needed for cough (take 1 capsule (100 mg) by mouth 3 times a day as needed for cough for up to 7 days). (Patient not taking: Reported on 07/17/2023)     clindamycin (CLEOCIN) 300 MG capsule Take 300 mg by mouth daily. (Patient not taking: Reported on 07/17/2023)     clonazePAM  (KLONOPIN ) 0.5 MG tablet Take 0.5 mg by mouth 2 (two) times daily as needed. (Patient not taking: Reported on 07/17/2023)     cyanocobalamin 100 MCG tablet Take 100 mcg by mouth daily.     esomeprazole (NEXIUM) 20 MG capsule Take 20 mg by mouth daily. (Patient not  taking: Reported on 07/17/2023)     famotidine  (PEPCID ) 20 MG tablet Take 20 mg by mouth at bedtime.     ferrous sulfate 325 (65 FE) MG EC tablet Take 325 mg by mouth daily.     fluticasone  (FLONASE ) 50 MCG/ACT nasal spray Place 1 spray into both nostrils daily. 16 g 2   furosemide  (LASIX ) 20 MG tablet Take 1 tablet (20 mg total) by mouth as needed for edema (swelling). (Patient not taking: Reported on 07/17/2023)     gabapentin  (NEURONTIN ) 300 MG capsule Take 300 mg by mouth at bedtime. 1 capsule nightly for neuropathy (Patient not taking: Reported on 07/17/2023)     guaifenesin  (ROBITUSSIN) 100 MG/5ML syrup Take 100 mg by mouth 3 (three) times daily as needed for cough. (Patient not taking: Reported on 07/17/2023)     hydrALAZINE  (APRESOLINE ) 25 MG tablet Take 25 mg by mouth 3 (three) times daily.     hydrocortisone  (ANUSOL -HC) 2.5 % rectal cream Place 1 Application rectally 2 (two) times daily. (Patient not taking: Reported on 07/17/2023) 30 g 0   levalbuterol  (XOPENEX ) 0.63 MG/3ML nebulizer solution Inhale 0.63 mg into the lungs.     losartan  (COZAAR ) 25 MG tablet Take 1.5 tablets (37.5 mg total) by mouth daily. (Patient not taking: Reported on 07/17/2023) 135 tablet 1   Magnesium  Oxide 400 MG CAPS Take 1 capsule (400 mg total) by mouth daily. 90 capsule 3   metoprolol  succinate (TOPROL  XL) 50 MG 24 hr tablet Take 1 tablet (50 mg total) by mouth daily. Take with or immediately following a meal. (Patient not taking: Reported on 07/17/2023) 30 tablet 3   metoprolol  tartrate (LOPRESSOR ) 25 MG tablet Take 12.5 mg by mouth 2 (two) times daily.     Multiple Vitamin (MULTIVITAMIN WITH MINERALS) TABS tablet Take 1 tablet by mouth daily. (Patient not taking: Reported on 07/17/2023)     ondansetron  (ZOFRAN ) 4 MG tablet Take 4 mg by mouth every 8 (eight) hours as needed for nausea or vomiting. Take 1-2 tablets (4-8 mg) by mouth every eight hours as needed for up to 16 doses     OXYGEN  Inhale 2 L into the lungs continuous.      pantoprazole  (PROTONIX ) 40 MG tablet Take 40 mg by mouth daily. Take 1 tablet by mouth daily before breakfast     potassium chloride  (KLOR-CON ) 10 MEQ tablet TAKE TWO TABLETS BY MOUTH ONCE DAILY (Patient not taking: Reported on 07/17/2023) 180 tablet 2  Respiratory Therapy Supplies (FLUTTER) DEVI 1 Device by Does not apply route in the morning, at noon, in the evening, and at bedtime. (Patient not taking: Reported on 07/17/2023) 1 each 0   sennosides-docusate sodium (SENOKOT-S) 8.6-50 MG tablet Take 1 tablet by mouth at bedtime.     thiamine (VITAMIN B-1) 100 MG tablet Take 100 mg by mouth daily.     traMADol  (ULTRAM ) 50 MG tablet TAKE 1 TABLET (50 MG TOTAL) BY MOUTH EVERY 12 (TWELVE) HOURS AS NEEDED. 30 tablet 0   TRELEGY ELLIPTA  100-62.5-25 MCG/ACT AEPB INHALE ONE (1) PUFF BY MOUTH ONCE DAILY *REFILL REQUEST* 60 each 10   triamcinolone cream (KENALOG) 0.5 % Apply 1 Application topically in the morning and at bedtime.     valsartan (DIOVAN) 160 MG tablet Take 160 mg by mouth daily.     venlafaxine  (EFFEXOR ) 75 MG tablet Take 75-150 mg by mouth 2 (two) times daily with a meal. Takes 2 tablets (150 mg) by mouth in the morning and take 1 tablet (75 mg) by mouth at night     No current facility-administered medications for this visit.     Past Surgical History:  Procedure Laterality Date   ABDOMINAL HYSTERECTOMY     APPENDECTOMY     BACK SURGERY     total of five surgeries   BREAST BIOPSY Left    benign   BREAST CAPSULECTOMY WITH IMPLANT EXCHANGE Right 05/27/2016   Procedure: REMOVAL AND REPLACEMENT OF RIGHT BREAST IMPLANT AND BREAST CAPSULECTOMY;  Surgeon: Phyllis Breeze, MD;  Location: [facility];  Service: Plastics;  Laterality: Right;   CARDIAC CATHETERIZATION     CARDIOVERSION N/A 05/17/2020   Procedure: CARDIOVERSION;  Surgeon: Alroy Aspen Lela Purple, MD;  Location: [facility] ENDOSCOPY;  Service: Cardiovascular;  Laterality: N/A;   CHOLECYSTECTOMY     COLONOSCOPY  08/2014   Dr.  Alline Ivans: Small sessile polyp at the cecum, removed, tubular adenoma   ESOPHAGOGASTRODUODENOSCOPY  06/2013   Dr. Alline Ivans: Hiatal hernia, Schatzki ring, nonobstructive.   MASTECTOMY     right    ORIF ANKLE FRACTURE Left 06/01/2014   Procedure: OPEN REDUCTION INTERNAL FIXATION (ORIF) LEFT ANKLE FRACTURE;  Surgeon: Adah Acron, MD;  Location: [facility] OR;  Service: Orthopedics;  Laterality: Left;   ROTATOR CUFF REPAIR     left   TEE WITHOUT CARDIOVERSION N/A 05/17/2020   Procedure: TRANSESOPHAGEAL ECHOCARDIOGRAM (TEE);  Surgeon: Lake Pilgrim, MD;  Location: [facility] ENDOSCOPY;  Service: Cardiovascular;  Laterality: N/A;   TOTAL HIP ARTHROPLASTY     right   TOTAL KNEE ARTHROPLASTY     bilateral     Allergies  Allergen Reactions   Morphine  Nausea And Vomiting    Other Reaction(s): GI Intolerance   Morphine  And Codeine Nausea And Vomiting   Penicillins Rash    Reaction: unknown   Prednisone  Anxiety, Nausea And Vomiting and Other (See Comments)    Makes patient not in right state of mind  Other Reaction(s): GI Intolerance  Makes patient not in right state of mind  Other Reaction(s): GI Intolerance  Makes patient not in right state of mind  Other Reaction(s): GI Intolerance    Makes patient not in right state of mind  Other Reaction(s): GI Intolerance  Makes patient not in right state of mind  Other Reaction(s): GI Intolerance   Sulfa Antibiotics Nausea And Vomiting   Sulfamethoxazole Nausea And Vomiting    Other Reaction(s): GI Intolerance      Family History  Problem Relation Age  of Onset   Heart disease Brother    Heart disease Sister    Breast cancer Sister    Leukemia Brother    Breast cancer Sister    Breast cancer Other        one deceased, one living   Colon cancer Neg Hx      Social History Ms. Rautio reports that she has never smoked. She has never been exposed to tobacco smoke. She has never used smokeless tobacco. Ms. Lymon reports no history of alcohol  use.     Physical Examination Today's Vitals   08/05/23 1339  BP: (!) 142/80  Pulse: 72  SpO2: 98%  Weight: 127 lb 9.6 oz (57.9 kg)  Height: 5' (1.524 m)   Body mass index is 24.92 kg/m.  Gen: resting comfortably, no acute distress HEENT: no scleral icterus, pupils equal round and reactive, no palptable cervical adenopathy,  CV: RRR, no mrg, no jvd Resp: Clear to auscultation bilaterally GI: abdomen is soft, non-tender, non-distended, normal bowel sounds, no hepatosplenomegaly MSK: extremities are warm, no edema.  Skin: warm, no rash Neuro:  no focal deficits Psych: appropriate affect   Assessment and Plan   Afib/acquired thrombophilia - intermittent falls as listed above. For now fairly infrequent would continue anticoag - continue to monitor at this time - continue current meds   2. HFimpEF -prior drop in LVEF likely tachymediated as it was in the setting of uncontrolled afib - LVEF has normalized - no symptoms, continue current meds      3. HTN - bp elevated, conservative management given advanced age and orthostatic dizziness -we will continue current meds       Laurann Pollock, M.D.

## 2023-08-05 NOTE — Telephone Encounter (Signed)
 Dr. Ernesta Heading pt - Amanda Palmer, the pt's niece called to schedule the pt for a rib injury.  She went to the ED at [facility].  Advised that we do not treat ribs.  She is going to call the PCP>

## 2023-08-06 DIAGNOSIS — F3341 Major depressive disorder, recurrent, in partial remission: Secondary | ICD-10-CM | POA: Diagnosis not present

## 2023-08-06 DIAGNOSIS — F419 Anxiety disorder, unspecified: Secondary | ICD-10-CM | POA: Diagnosis not present

## 2023-08-07 DIAGNOSIS — J449 Chronic obstructive pulmonary disease, unspecified: Secondary | ICD-10-CM | POA: Diagnosis not present

## 2023-08-08 DIAGNOSIS — R4189 Other symptoms and signs involving cognitive functions and awareness: Secondary | ICD-10-CM | POA: Diagnosis not present

## 2023-08-08 DIAGNOSIS — M818 Other osteoporosis without current pathological fracture: Secondary | ICD-10-CM | POA: Diagnosis not present

## 2023-08-08 DIAGNOSIS — E782 Mixed hyperlipidemia: Secondary | ICD-10-CM | POA: Diagnosis not present

## 2023-08-08 DIAGNOSIS — I10 Essential (primary) hypertension: Secondary | ICD-10-CM | POA: Diagnosis not present

## 2023-08-13 DIAGNOSIS — I420 Dilated cardiomyopathy: Secondary | ICD-10-CM | POA: Diagnosis not present

## 2023-08-13 DIAGNOSIS — R419 Unspecified symptoms and signs involving cognitive functions and awareness: Secondary | ICD-10-CM | POA: Diagnosis not present

## 2023-08-13 DIAGNOSIS — S2232XD Fracture of one rib, left side, subsequent encounter for fracture with routine healing: Secondary | ICD-10-CM | POA: Diagnosis not present

## 2023-08-13 DIAGNOSIS — J449 Chronic obstructive pulmonary disease, unspecified: Secondary | ICD-10-CM | POA: Diagnosis not present

## 2023-08-13 DIAGNOSIS — I482 Chronic atrial fibrillation, unspecified: Secondary | ICD-10-CM | POA: Diagnosis not present

## 2023-08-13 DIAGNOSIS — R296 Repeated falls: Secondary | ICD-10-CM | POA: Diagnosis not present

## 2023-08-13 DIAGNOSIS — G629 Polyneuropathy, unspecified: Secondary | ICD-10-CM | POA: Diagnosis not present

## 2023-08-13 DIAGNOSIS — I10 Essential (primary) hypertension: Secondary | ICD-10-CM | POA: Diagnosis not present

## 2023-08-13 DIAGNOSIS — F419 Anxiety disorder, unspecified: Secondary | ICD-10-CM | POA: Diagnosis not present

## 2023-08-13 DIAGNOSIS — F3341 Major depressive disorder, recurrent, in partial remission: Secondary | ICD-10-CM | POA: Diagnosis not present

## 2023-08-13 DIAGNOSIS — S2232XA Fracture of one rib, left side, initial encounter for closed fracture: Secondary | ICD-10-CM | POA: Diagnosis not present

## 2023-08-13 DIAGNOSIS — M1612 Unilateral primary osteoarthritis, left hip: Secondary | ICD-10-CM | POA: Diagnosis not present

## 2023-08-13 DIAGNOSIS — R9389 Abnormal findings on diagnostic imaging of other specified body structures: Secondary | ICD-10-CM | POA: Diagnosis not present

## 2023-08-14 DIAGNOSIS — I130 Hypertensive heart and chronic kidney disease with heart failure and stage 1 through stage 4 chronic kidney disease, or unspecified chronic kidney disease: Secondary | ICD-10-CM | POA: Diagnosis not present

## 2023-08-14 DIAGNOSIS — I4891 Unspecified atrial fibrillation: Secondary | ICD-10-CM | POA: Diagnosis not present

## 2023-08-14 DIAGNOSIS — J4489 Other specified chronic obstructive pulmonary disease: Secondary | ICD-10-CM | POA: Diagnosis not present

## 2023-08-14 DIAGNOSIS — I272 Pulmonary hypertension, unspecified: Secondary | ICD-10-CM | POA: Diagnosis not present

## 2023-08-14 DIAGNOSIS — E785 Hyperlipidemia, unspecified: Secondary | ICD-10-CM | POA: Diagnosis not present

## 2023-08-14 DIAGNOSIS — F320 Major depressive disorder, single episode, mild: Secondary | ICD-10-CM | POA: Diagnosis not present

## 2023-08-14 DIAGNOSIS — N1831 Chronic kidney disease, stage 3a: Secondary | ICD-10-CM | POA: Diagnosis not present

## 2023-08-14 DIAGNOSIS — I739 Peripheral vascular disease, unspecified: Secondary | ICD-10-CM | POA: Diagnosis not present

## 2023-08-14 DIAGNOSIS — I700 Atherosclerosis of aorta: Secondary | ICD-10-CM | POA: Diagnosis not present

## 2023-08-14 DIAGNOSIS — G319 Degenerative disease of nervous system, unspecified: Secondary | ICD-10-CM | POA: Diagnosis not present

## 2023-08-14 DIAGNOSIS — I429 Cardiomyopathy, unspecified: Secondary | ICD-10-CM | POA: Diagnosis not present

## 2023-08-14 DIAGNOSIS — I509 Heart failure, unspecified: Secondary | ICD-10-CM | POA: Diagnosis not present

## 2023-08-15 ENCOUNTER — Other Ambulatory Visit (HOSPITAL_COMMUNITY): Payer: Self-pay | Admitting: Family Medicine

## 2023-08-15 DIAGNOSIS — I739 Peripheral vascular disease, unspecified: Secondary | ICD-10-CM | POA: Diagnosis not present

## 2023-08-15 DIAGNOSIS — D509 Iron deficiency anemia, unspecified: Secondary | ICD-10-CM | POA: Diagnosis not present

## 2023-08-15 DIAGNOSIS — R4189 Other symptoms and signs involving cognitive functions and awareness: Secondary | ICD-10-CM | POA: Diagnosis not present

## 2023-08-15 DIAGNOSIS — K219 Gastro-esophageal reflux disease without esophagitis: Secondary | ICD-10-CM | POA: Diagnosis not present

## 2023-08-15 DIAGNOSIS — I10 Essential (primary) hypertension: Secondary | ICD-10-CM | POA: Diagnosis not present

## 2023-08-15 DIAGNOSIS — E782 Mixed hyperlipidemia: Secondary | ICD-10-CM | POA: Diagnosis not present

## 2023-08-15 DIAGNOSIS — E042 Nontoxic multinodular goiter: Secondary | ICD-10-CM | POA: Insufficient documentation

## 2023-08-15 DIAGNOSIS — I420 Dilated cardiomyopathy: Secondary | ICD-10-CM | POA: Diagnosis not present

## 2023-08-15 DIAGNOSIS — F419 Anxiety disorder, unspecified: Secondary | ICD-10-CM | POA: Diagnosis not present

## 2023-08-15 DIAGNOSIS — I482 Chronic atrial fibrillation, unspecified: Secondary | ICD-10-CM | POA: Diagnosis not present

## 2023-08-15 DIAGNOSIS — Z9181 History of falling: Secondary | ICD-10-CM | POA: Diagnosis not present

## 2023-08-15 DIAGNOSIS — M818 Other osteoporosis without current pathological fracture: Secondary | ICD-10-CM | POA: Diagnosis not present

## 2023-08-15 DIAGNOSIS — J449 Chronic obstructive pulmonary disease, unspecified: Secondary | ICD-10-CM | POA: Diagnosis not present

## 2023-08-19 ENCOUNTER — Other Ambulatory Visit: Payer: Self-pay | Admitting: Orthopedic Surgery

## 2023-08-20 DIAGNOSIS — F330 Major depressive disorder, recurrent, mild: Secondary | ICD-10-CM | POA: Diagnosis not present

## 2023-08-20 DIAGNOSIS — F419 Anxiety disorder, unspecified: Secondary | ICD-10-CM | POA: Diagnosis not present

## 2023-08-20 DIAGNOSIS — J449 Chronic obstructive pulmonary disease, unspecified: Secondary | ICD-10-CM | POA: Diagnosis not present

## 2023-08-22 ENCOUNTER — Ambulatory Visit (HOSPITAL_COMMUNITY)
Admission: RE | Admit: 2023-08-22 | Discharge: 2023-08-22 | Disposition: A | Discharge status: Home / Self Care | Source: Ambulatory Visit | Attending: Family Medicine | Admitting: Family Medicine

## 2023-08-22 ENCOUNTER — Telehealth: Payer: Self-pay | Admitting: Orthopedic Surgery

## 2023-08-22 DIAGNOSIS — E042 Nontoxic multinodular goiter: Secondary | ICD-10-CM | POA: Diagnosis not present

## 2023-08-22 DIAGNOSIS — E0789 Other specified disorders of thyroid: Secondary | ICD-10-CM | POA: Diagnosis not present

## 2023-08-22 DIAGNOSIS — M25552 Pain in left hip: Secondary | ICD-10-CM

## 2023-08-22 NOTE — Telephone Encounter (Signed)
 Dr. Ernesta Heading pt - Amanda Palmer [phone] lvm stating the pt has decided to move forward w/back injections. Needs a referral?

## 2023-08-26 ENCOUNTER — Other Ambulatory Visit: Payer: Self-pay | Admitting: Orthopedic Surgery

## 2023-08-26 NOTE — Addendum Note (Signed)
 Addended by: Marti Slates on: 08/26/2023 08:19 AM   Modules accepted: Orders

## 2023-08-28 DIAGNOSIS — F419 Anxiety disorder, unspecified: Secondary | ICD-10-CM | POA: Diagnosis not present

## 2023-08-28 DIAGNOSIS — F3341 Major depressive disorder, recurrent, in partial remission: Secondary | ICD-10-CM | POA: Diagnosis not present

## 2023-08-29 DIAGNOSIS — F419 Anxiety disorder, unspecified: Secondary | ICD-10-CM | POA: Diagnosis not present

## 2023-08-29 DIAGNOSIS — I420 Dilated cardiomyopathy: Secondary | ICD-10-CM | POA: Diagnosis not present

## 2023-08-29 DIAGNOSIS — K219 Gastro-esophageal reflux disease without esophagitis: Secondary | ICD-10-CM | POA: Diagnosis not present

## 2023-08-29 DIAGNOSIS — J449 Chronic obstructive pulmonary disease, unspecified: Secondary | ICD-10-CM | POA: Diagnosis not present

## 2023-08-29 DIAGNOSIS — I482 Chronic atrial fibrillation, unspecified: Secondary | ICD-10-CM | POA: Diagnosis not present

## 2023-08-29 DIAGNOSIS — E782 Mixed hyperlipidemia: Secondary | ICD-10-CM | POA: Diagnosis not present

## 2023-08-29 DIAGNOSIS — M818 Other osteoporosis without current pathological fracture: Secondary | ICD-10-CM | POA: Diagnosis not present

## 2023-08-29 DIAGNOSIS — I10 Essential (primary) hypertension: Secondary | ICD-10-CM | POA: Diagnosis not present

## 2023-08-29 DIAGNOSIS — E039 Hypothyroidism, unspecified: Secondary | ICD-10-CM | POA: Insufficient documentation

## 2023-08-29 DIAGNOSIS — R4189 Other symptoms and signs involving cognitive functions and awareness: Secondary | ICD-10-CM | POA: Diagnosis not present

## 2023-08-29 DIAGNOSIS — D509 Iron deficiency anemia, unspecified: Secondary | ICD-10-CM | POA: Diagnosis not present

## 2023-08-29 DIAGNOSIS — I739 Peripheral vascular disease, unspecified: Secondary | ICD-10-CM | POA: Diagnosis not present

## 2023-08-29 DIAGNOSIS — Z9181 History of falling: Secondary | ICD-10-CM | POA: Diagnosis not present

## 2023-09-02 ENCOUNTER — Other Ambulatory Visit: Payer: Self-pay | Admitting: Orthopedic Surgery

## 2023-09-02 ENCOUNTER — Other Ambulatory Visit: Payer: Self-pay | Admitting: *Deleted

## 2023-09-02 ENCOUNTER — Encounter: Payer: Self-pay | Admitting: *Deleted

## 2023-09-02 NOTE — Patient Outreach (Signed)
 Duplicate note opened Unsuccessful outreach   Torri Michalski L. Ramonita, RN, BSN, CCM [place]  [facility], [facility] RN Care Manager Direct Dial: [phone]  Fax: [phone]

## 2023-09-07 DIAGNOSIS — J449 Chronic obstructive pulmonary disease, unspecified: Secondary | ICD-10-CM | POA: Diagnosis not present

## 2023-09-08 DIAGNOSIS — J449 Chronic obstructive pulmonary disease, unspecified: Secondary | ICD-10-CM | POA: Diagnosis not present

## 2023-09-08 DIAGNOSIS — E1165 Type 2 diabetes mellitus with hyperglycemia: Secondary | ICD-10-CM | POA: Diagnosis not present

## 2023-09-08 DIAGNOSIS — I739 Peripheral vascular disease, unspecified: Secondary | ICD-10-CM | POA: Diagnosis not present

## 2023-09-08 DIAGNOSIS — I10 Essential (primary) hypertension: Secondary | ICD-10-CM | POA: Diagnosis not present

## 2023-09-08 DIAGNOSIS — R4189 Other symptoms and signs involving cognitive functions and awareness: Secondary | ICD-10-CM | POA: Diagnosis not present

## 2023-09-08 DIAGNOSIS — D649 Anemia, unspecified: Secondary | ICD-10-CM | POA: Diagnosis not present

## 2023-09-09 ENCOUNTER — Ambulatory Visit (INDEPENDENT_AMBULATORY_CARE_PROVIDER_SITE_OTHER): Admitting: Orthopedic Surgery

## 2023-09-09 ENCOUNTER — Encounter: Payer: Self-pay | Admitting: Orthopedic Surgery

## 2023-09-09 DIAGNOSIS — M1612 Unilateral primary osteoarthritis, left hip: Secondary | ICD-10-CM | POA: Diagnosis not present

## 2023-09-09 NOTE — Patient Instructions (Signed)

## 2023-09-09 NOTE — Progress Notes (Signed)
 Orthopaedic Clinic Return  Assessment: Amanda Palmer is a 89 y.o. female with the following: Left hip arthritis  Plan: Mrs. Imel has severe left hip arthritis.  She continues to have pain, radiating distally towards the left knee.  Radiographs demonstrate advanced degenerative changes.  We discussed proceeding with an ultrasound-guided injection, this was completed in clinic today.  She will return to clinic as needed.  Procedure note injection - Left hip, ultrasound guidance   Verbal consent was obtained to inject the Left hip joint  Timeout was completed to confirm the site of injection.   Using the ultrasound, the femoral neck was identified.  The joint space was also identified. The skin was prepped with alcohol and ethyl chloride was sprayed at the injection site.  A 21-gauge needle was used to inject 40 mg of Depo-Medrol  and 1% lidocaine  (4 cc) into the hip joint of the Left hip using a direct anterior approach.  The needle was visualized entering the hip joint, and the medication was also visualized. There were no complications.  A sterile bandage was applied.   Note: In order to accurately identify the placement of the needle, ultrasound was required, to increase the accuracy, and specificity of the injection.  Follow-up: Return if symptoms worsen or fail to improve.   Subjective:  Chief Complaint  Patient presents with   Injections    Left hip    History of Present Illness: Amanda Palmer is a 89 y.o. female who returns to clinic for evaluation of left hip pain.  She continues to have a lot of pain in the left hip, radiating distally towards the left knee.  She does have a history of right hip replacement.  She is having similar type pains in the left hip.  She is here to discuss options.   Review of Systems: No fevers or chills No numbness or tingling No chest pain No shortness of breath No bowel or bladder dysfunction No GI distress No  headaches   Objective: There were no vitals taken for this visit.  Physical Exam:  Elderly female.  Alert and oriented.  No acute distress.  Ambulating with a cane.  Evaluation left hip demonstrates no deformity.  No redness.  No swelling.  Pain with internal and external rotation of the left hip.  Pain is within the groin.  She is able to maintain a straight leg raise.  Toes warm and well-perfused.  2+ DP pulse.  Sensation intact of the dorsum of the foot. Negative straight leg testing.   IMAGING: I personally ordered and reviewed the following images:  No new imaging obtained today.   Oneil DELENA Horde, MD 09/09/2023 4:12 PM

## 2023-09-10 DIAGNOSIS — F419 Anxiety disorder, unspecified: Secondary | ICD-10-CM | POA: Diagnosis not present

## 2023-09-10 DIAGNOSIS — F330 Major depressive disorder, recurrent, mild: Secondary | ICD-10-CM | POA: Diagnosis not present

## 2023-09-16 DIAGNOSIS — K219 Gastro-esophageal reflux disease without esophagitis: Secondary | ICD-10-CM | POA: Diagnosis not present

## 2023-09-16 DIAGNOSIS — Z9181 History of falling: Secondary | ICD-10-CM | POA: Diagnosis not present

## 2023-09-16 DIAGNOSIS — I10 Essential (primary) hypertension: Secondary | ICD-10-CM | POA: Diagnosis not present

## 2023-09-16 DIAGNOSIS — J449 Chronic obstructive pulmonary disease, unspecified: Secondary | ICD-10-CM | POA: Diagnosis not present

## 2023-09-16 DIAGNOSIS — E782 Mixed hyperlipidemia: Secondary | ICD-10-CM | POA: Diagnosis not present

## 2023-09-16 DIAGNOSIS — M818 Other osteoporosis without current pathological fracture: Secondary | ICD-10-CM | POA: Diagnosis not present

## 2023-09-16 DIAGNOSIS — F419 Anxiety disorder, unspecified: Secondary | ICD-10-CM | POA: Diagnosis not present

## 2023-09-16 DIAGNOSIS — I420 Dilated cardiomyopathy: Secondary | ICD-10-CM | POA: Diagnosis not present

## 2023-09-16 DIAGNOSIS — R4189 Other symptoms and signs involving cognitive functions and awareness: Secondary | ICD-10-CM | POA: Diagnosis not present

## 2023-09-16 DIAGNOSIS — I482 Chronic atrial fibrillation, unspecified: Secondary | ICD-10-CM | POA: Diagnosis not present

## 2023-09-16 DIAGNOSIS — D509 Iron deficiency anemia, unspecified: Secondary | ICD-10-CM | POA: Diagnosis not present

## 2023-09-16 DIAGNOSIS — I739 Peripheral vascular disease, unspecified: Secondary | ICD-10-CM | POA: Diagnosis not present

## 2023-09-17 DIAGNOSIS — F419 Anxiety disorder, unspecified: Secondary | ICD-10-CM | POA: Diagnosis not present

## 2023-09-17 DIAGNOSIS — F330 Major depressive disorder, recurrent, mild: Secondary | ICD-10-CM | POA: Diagnosis not present

## 2023-09-19 DIAGNOSIS — J449 Chronic obstructive pulmonary disease, unspecified: Secondary | ICD-10-CM | POA: Diagnosis not present

## 2023-09-24 DIAGNOSIS — F330 Major depressive disorder, recurrent, mild: Secondary | ICD-10-CM | POA: Diagnosis not present

## 2023-09-24 DIAGNOSIS — F419 Anxiety disorder, unspecified: Secondary | ICD-10-CM | POA: Diagnosis not present

## 2023-09-25 DIAGNOSIS — F03A0 Unspecified dementia, mild, without behavioral disturbance, psychotic disturbance, mood disturbance, and anxiety: Secondary | ICD-10-CM | POA: Diagnosis not present

## 2023-09-25 DIAGNOSIS — F3341 Major depressive disorder, recurrent, in partial remission: Secondary | ICD-10-CM | POA: Diagnosis not present

## 2023-09-25 DIAGNOSIS — F419 Anxiety disorder, unspecified: Secondary | ICD-10-CM | POA: Diagnosis not present

## 2023-09-26 DIAGNOSIS — F32A Depression, unspecified: Secondary | ICD-10-CM | POA: Diagnosis not present

## 2023-09-26 DIAGNOSIS — M47816 Spondylosis without myelopathy or radiculopathy, lumbar region: Secondary | ICD-10-CM | POA: Diagnosis not present

## 2023-09-26 DIAGNOSIS — I4891 Unspecified atrial fibrillation: Secondary | ICD-10-CM | POA: Diagnosis not present

## 2023-09-26 DIAGNOSIS — M549 Dorsalgia, unspecified: Secondary | ICD-10-CM | POA: Diagnosis not present

## 2023-09-26 DIAGNOSIS — S098XXA Other specified injuries of head, initial encounter: Secondary | ICD-10-CM | POA: Diagnosis not present

## 2023-09-26 DIAGNOSIS — J449 Chronic obstructive pulmonary disease, unspecified: Secondary | ICD-10-CM | POA: Diagnosis not present

## 2023-09-26 DIAGNOSIS — R519 Headache, unspecified: Secondary | ICD-10-CM | POA: Diagnosis not present

## 2023-09-26 DIAGNOSIS — M545 Low back pain, unspecified: Secondary | ICD-10-CM | POA: Diagnosis not present

## 2023-09-26 DIAGNOSIS — S0990XA Unspecified injury of head, initial encounter: Secondary | ICD-10-CM | POA: Diagnosis not present

## 2023-09-26 DIAGNOSIS — W1839XA Other fall on same level, initial encounter: Secondary | ICD-10-CM | POA: Diagnosis not present

## 2023-09-26 DIAGNOSIS — Z859 Personal history of malignant neoplasm, unspecified: Secondary | ICD-10-CM | POA: Diagnosis not present

## 2023-09-26 DIAGNOSIS — W19XXXA Unspecified fall, initial encounter: Secondary | ICD-10-CM | POA: Diagnosis not present

## 2023-09-26 DIAGNOSIS — F419 Anxiety disorder, unspecified: Secondary | ICD-10-CM | POA: Diagnosis not present

## 2023-09-26 DIAGNOSIS — I10 Essential (primary) hypertension: Secondary | ICD-10-CM | POA: Diagnosis not present

## 2023-09-26 DIAGNOSIS — R079 Chest pain, unspecified: Secondary | ICD-10-CM | POA: Diagnosis not present

## 2023-09-26 DIAGNOSIS — S3982XA Other specified injuries of lower back, initial encounter: Secondary | ICD-10-CM | POA: Diagnosis not present

## 2023-09-26 DIAGNOSIS — M5031 Other cervical disc degeneration,  high cervical region: Secondary | ICD-10-CM | POA: Diagnosis not present

## 2023-09-26 DIAGNOSIS — S199XXA Unspecified injury of neck, initial encounter: Secondary | ICD-10-CM | POA: Diagnosis not present

## 2023-09-26 DIAGNOSIS — M542 Cervicalgia: Secondary | ICD-10-CM | POA: Diagnosis not present

## 2023-09-26 DIAGNOSIS — Z7901 Long term (current) use of anticoagulants: Secondary | ICD-10-CM | POA: Diagnosis not present

## 2023-09-26 DIAGNOSIS — Z743 Need for continuous supervision: Secondary | ICD-10-CM | POA: Diagnosis not present

## 2023-09-26 DIAGNOSIS — W0110XA Fall on same level from slipping, tripping and stumbling with subsequent striking against unspecified object, initial encounter: Secondary | ICD-10-CM | POA: Diagnosis not present

## 2023-09-26 DIAGNOSIS — E785 Hyperlipidemia, unspecified: Secondary | ICD-10-CM | POA: Diagnosis not present

## 2023-09-26 DIAGNOSIS — M47812 Spondylosis without myelopathy or radiculopathy, cervical region: Secondary | ICD-10-CM | POA: Diagnosis not present

## 2023-09-27 DIAGNOSIS — Z743 Need for continuous supervision: Secondary | ICD-10-CM | POA: Diagnosis not present

## 2023-09-27 DIAGNOSIS — I10 Essential (primary) hypertension: Secondary | ICD-10-CM | POA: Diagnosis not present

## 2023-10-03 DIAGNOSIS — Z9181 History of falling: Secondary | ICD-10-CM | POA: Diagnosis not present

## 2023-10-03 DIAGNOSIS — D509 Iron deficiency anemia, unspecified: Secondary | ICD-10-CM | POA: Diagnosis not present

## 2023-10-03 DIAGNOSIS — J449 Chronic obstructive pulmonary disease, unspecified: Secondary | ICD-10-CM | POA: Diagnosis not present

## 2023-10-03 DIAGNOSIS — E782 Mixed hyperlipidemia: Secondary | ICD-10-CM | POA: Diagnosis not present

## 2023-10-03 DIAGNOSIS — I739 Peripheral vascular disease, unspecified: Secondary | ICD-10-CM | POA: Diagnosis not present

## 2023-10-03 DIAGNOSIS — K219 Gastro-esophageal reflux disease without esophagitis: Secondary | ICD-10-CM | POA: Diagnosis not present

## 2023-10-03 DIAGNOSIS — I10 Essential (primary) hypertension: Secondary | ICD-10-CM | POA: Diagnosis not present

## 2023-10-03 DIAGNOSIS — F419 Anxiety disorder, unspecified: Secondary | ICD-10-CM | POA: Diagnosis not present

## 2023-10-03 DIAGNOSIS — I420 Dilated cardiomyopathy: Secondary | ICD-10-CM | POA: Diagnosis not present

## 2023-10-03 DIAGNOSIS — M818 Other osteoporosis without current pathological fracture: Secondary | ICD-10-CM | POA: Diagnosis not present

## 2023-10-03 DIAGNOSIS — I482 Chronic atrial fibrillation, unspecified: Secondary | ICD-10-CM | POA: Diagnosis not present

## 2023-10-03 DIAGNOSIS — R4189 Other symptoms and signs involving cognitive functions and awareness: Secondary | ICD-10-CM | POA: Diagnosis not present

## 2023-10-06 ENCOUNTER — Encounter: Payer: Self-pay | Admitting: Physical Medicine and Rehabilitation

## 2023-10-06 ENCOUNTER — Ambulatory Visit: Admitting: Physical Medicine and Rehabilitation

## 2023-10-06 DIAGNOSIS — M5442 Lumbago with sciatica, left side: Secondary | ICD-10-CM | POA: Diagnosis not present

## 2023-10-06 DIAGNOSIS — M961 Postlaminectomy syndrome, not elsewhere classified: Secondary | ICD-10-CM

## 2023-10-06 DIAGNOSIS — M5416 Radiculopathy, lumbar region: Secondary | ICD-10-CM | POA: Diagnosis not present

## 2023-10-06 DIAGNOSIS — G8929 Other chronic pain: Secondary | ICD-10-CM | POA: Diagnosis not present

## 2023-10-06 DIAGNOSIS — F0390 Unspecified dementia without behavioral disturbance: Secondary | ICD-10-CM | POA: Diagnosis not present

## 2023-10-06 DIAGNOSIS — R1032 Left lower quadrant pain: Secondary | ICD-10-CM

## 2023-10-06 DIAGNOSIS — F330 Major depressive disorder, recurrent, mild: Secondary | ICD-10-CM | POA: Diagnosis not present

## 2023-10-06 NOTE — Progress Notes (Unsigned)
 Amanda Palmer - 89 y.o. female MRN [id number]  Date of birth: November 08, 1933  Office Visit Note: Visit Date: 10/06/2023 PCP: Shona Norleen PEDLAR, MD Referred by: Shona Norleen PEDLAR, MD  Subjective: Chief Complaint  Patient presents with   Left Hip - Pain   HPI: Amanda Palmer is a 89 y.o. female who comes in today per the request of Dr. Oneil Horde for evaluation of chronic, worsening and severe left sided lower back pain radiating to buttock, groin and down anterior thigh to knee. Pain ongoing for several months. Her pain worsens with prolonged standing and walking. She describes pain as dull and aching sensation, currently rates as 9 out of 10. Some relief of pain with home exercise regimen, rest and use of medications. She was recently evaluated in the emergency room for fall on 09/26/2023. Recent CT of lumbar spine shows laminectomies and posterior instrumented fusion at L4-L5, no evidence for acute hardware complication. Slight anterior wedging of the L1 vertebral body unchanged compared with previous imaging. She reports history of (5) total lumbar surgeries several years ago in [place]. Recent left hip radiographs show advanced degenerative changes with complete loss of joint space. History of left ultrasound guided intra-articular hip injection with Dr. Horde on 09/09/2023, no relief of pain with injection. Patient denies focal weakness, numbness and tingling. Currently using rolling walker to assist with ambulation.   Patients course is complicated by atrial fibrillation and COPD. Does take Eliquis . She currently resides at [facility] in [place].        Review of Systems  Musculoskeletal:  Positive for back pain and joint pain.  Neurological:  Negative for tingling, sensory change, focal weakness and weakness.  All other systems reviewed and are negative.  Otherwise per HPI.  Assessment & Plan: Visit Diagnoses:    ICD-10-CM   1. Chronic left-sided low back pain with left-sided sciatica  M54.42  Ambulatory referral to Physical Medicine Rehab   G89.29 MR LUMBAR SPINE WO CONTRAST    2. Groin pain, chronic, left  R10.32 Ambulatory referral to Physical Medicine Rehab   G89.29 MR LUMBAR SPINE WO CONTRAST    3. Lumbar radiculopathy  M54.16 Ambulatory referral to Physical Medicine Rehab    MR LUMBAR SPINE WO CONTRAST    4. Post laminectomy syndrome  M96.1 Ambulatory referral to Physical Medicine Rehab    MR LUMBAR SPINE WO CONTRAST       Plan: Findings:  Chronic, worsening and severe left sided lower back pain radiating to buttock, groin and down anterior thigh to knee. Patient continues to have severe pain despite good conservative therapies such as home exercise regimen, rest and use of medications. Patients clinical presentation and exam are complex, differentials include intrinsic left hip issue vs lumbar radiculopathy. There is advanced osteoarthritis to left hip on recent radiographs. She does have pain with internal/external rotation of left hip. I am also concerned about worsening central stenosis at the level above her lumbar fusion. We discussed treatment plan in detail today. Next step is to perform diagnostic and hopefully therapeutic left intra-articular hip injection under fluoroscopic guidance. I also placed order for new lumbar MRI imaging. We will see how she does with left hip injection, would consider performing lumbar epidural steroid injection if warranted. She has on questions at this time. No red flag symptoms noted upon exam today.     Meds & Orders: No orders of the defined types were placed in this encounter.   Orders Placed This Encounter  Procedures  MR LUMBAR SPINE WO CONTRAST   Ambulatory referral to Physical Medicine Rehab    Follow-up: Return for Left intra-articular hip injection.   Procedures: No procedures performed      Clinical History: No specialty comments available.   She reports that she has never smoked. She has never been exposed to  tobacco smoke. She has never used smokeless tobacco. No results for input(s): HGBA1C, LABURIC in the last 8760 hours.  Objective:  VS:  HT:    WT:   BMI:     BP:   HR: bpm  TEMP: ( )  RESP:  Physical Exam Vitals and nursing note reviewed.  HENT:     Head: Normocephalic and atraumatic.     Right Ear: External ear normal.     Left Ear: External ear normal.     Nose: Nose normal.     Mouth/Throat:     Mouth: Mucous membranes are moist.  Eyes:     Extraocular Movements: Extraocular movements intact.  Cardiovascular:     Rate and Rhythm: Normal rate.     Pulses: Normal pulses.  Pulmonary:     Effort: Pulmonary effort is normal.  Abdominal:     General: Abdomen is flat. There is no distension.  Musculoskeletal:        General: Tenderness present.     Cervical back: Normal range of motion.     Comments: Patient is slow to rise from seated position to standing. Good lumbar range of motion. No pain noted with facet loading. 5/5 strength noted with bilateral hip flexion, knee flexion/extension, ankle dorsiflexion/plantarflexion and EHL. No clonus noted bilaterally. No pain upon palpation of greater trochanters. Pain with internal/external rotation of left hip. Sensation intact bilaterally. Negative slump test bilaterally. Ambulates with rolling walker, gait slow and unsteady.   Skin:    General: Skin is warm and dry.     Capillary Refill: Capillary refill takes less than 2 seconds.  Neurological:     General: No focal deficit present.     Mental Status: She is alert and oriented to person, place, and time.  Psychiatric:        Mood and Affect: Mood normal.        Behavior: Behavior normal.     Ortho Exam  Imaging: No results found.  Past Medical/Family/Surgical/Social History: Medications & Allergies reviewed per EMR, new medications updated. Patient Active Problem List   Diagnosis Date Noted   Nocturnal hypoxemia 06/03/2023   Acute cystitis 04/03/2023   Acute viral  syndrome 04/03/2023   Influenza A 04/03/2023   Nausea 04/03/2023   Nursing home resident 04/03/2023   Altered mental status 03/15/2023   Hypertensive emergency 03/15/2023   Prolonged QT interval 03/25/2022   Lung nodule 03/25/2022   Periorbital hematoma of left eye 03/25/2022   Dysphagia 01/30/2022   Dizziness and giddiness 08/09/2021   Weakness 08/09/2021   History of falling 08/09/2021   Dehydration 08/09/2021   Long term (current) use of anticoagulants 07/09/2021   Dependence on supplemental oxygen  07/09/2021   Prsnl hx of TIA (TIA), and cereb infrc w/o resid deficits 03/11/2021   Permanent atrial fibrillation (HCC) 05/09/2020   Secondary hypercoagulable state (HCC) 05/09/2020   Abnormal CT of the chest 07/06/2019   Closed displaced fracture of fifth metatarsal bone of left foot 01/11/2019   Hematoma of arm, right, initial encounter 08/24/2018   Gastro-esophageal reflux disease without esophagitis 10/22/2016   H/O adenomatous polyp of colon 10/22/2016   Posterior vitreous detachment of both  eyes 04/10/2016   Pseudophakia 04/10/2016   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   Vocal fold atrophy 08/16/2015   Cough 03/29/2015   Upper airway cough syndrome 03/29/2015   Dyspnea 02/23/2015   Trimalleolar fracture of ankle, closed 05/31/2014   Syncope and collapse 05/31/2014   Scalp laceration 05/31/2014   ARF (acute renal failure) (HCC) 05/31/2014   Syncope 05/31/2014   COPD mixed type (HCC) 05/22/2014   COPD exacerbation (HCC) 05/22/2014   Arrhythmia 05/17/2014   Chest pain 04/12/2013   Cerebrovascular disease 08/01/2011   Esophageal reflux 08/01/2011   Depressive disorder 08/01/2011   Gastritis and duodenitis 08/01/2011   History of breast cancer 08/01/2011   Hyperlipidemia 08/01/2011   Polypharmacy 08/01/2011   Vitamin D deficiency 08/01/2011   Anxiety state 08/01/2011   Essential (primary) hypertension 08/01/2011   DDD (degenerative disc disease), cervical 08/01/2011    Past Medical History:  Diagnosis Date   A-fib (HCC)    Anxiety    Arthritis    Cancer (HCC)    breast   COPD (chronic obstructive pulmonary disease) (HCC)    Depression    GERD (gastroesophageal reflux disease)    Hypertension    TIA (transient ischemic attack)    remote   Family History  Problem Relation Age of Onset   Heart disease Brother    Heart disease Sister    Breast cancer Sister    Leukemia Brother    Breast cancer Sister    Breast cancer Other        one deceased, one living   Colon cancer Neg Hx    Past Surgical History:  Procedure Laterality Date   ABDOMINAL HYSTERECTOMY     APPENDECTOMY     BACK SURGERY     total of five surgeries   BREAST BIOPSY Left    benign   BREAST CAPSULECTOMY WITH IMPLANT EXCHANGE Right 05/27/2016   Procedure: REMOVAL AND REPLACEMENT OF RIGHT BREAST IMPLANT AND BREAST CAPSULECTOMY;  Surgeon: Elna Pick, MD;  Location: [facility];  Service: Plastics;  Laterality: Right;   CARDIAC CATHETERIZATION     CARDIOVERSION N/A 05/17/2020   Procedure: CARDIOVERSION;  Surgeon: Alveta Aleene PARAS, MD;  Location: [facility] ENDOSCOPY;  Service: Cardiovascular;  Laterality: N/A;   CHOLECYSTECTOMY     COLONOSCOPY  08/2014   Dr. Donnel: Small sessile polyp at the cecum, removed, tubular adenoma   ESOPHAGOGASTRODUODENOSCOPY  06/2013   Dr. Donnel: Hiatal hernia, Schatzki ring, nonobstructive.   MASTECTOMY     right    ORIF ANKLE FRACTURE Left 06/01/2014   Procedure: OPEN REDUCTION INTERNAL FIXATION (ORIF) LEFT ANKLE FRACTURE;  Surgeon: Oneil JAYSON Herald, MD;  Location: [facility] OR;  Service: Orthopedics;  Laterality: Left;   ROTATOR CUFF REPAIR     left   TEE WITHOUT CARDIOVERSION N/A 05/17/2020   Procedure: TRANSESOPHAGEAL ECHOCARDIOGRAM (TEE);  Surgeon: Alveta Aleene PARAS, MD;  Location: [facility] ENDOSCOPY;  Service: Cardiovascular;  Laterality: N/A;   TOTAL HIP ARTHROPLASTY     right   TOTAL KNEE ARTHROPLASTY     bilateral   Social History    Occupational History   Not on file  Tobacco Use   Smoking status: Never    Passive exposure: Never   Smokeless tobacco: Never  Vaping Use   Vaping status: Never Used  Substance and Sexual Activity   Alcohol use: No    Alcohol/week: 0.0 standard drinks of alcohol   Drug use: No   Sexual activity: Not Currently    Partners: Male

## 2023-10-06 NOTE — Progress Notes (Unsigned)
 Pain Scale   Average Pain 8  183/66  Pain started beginning of year Left hip to left knee Sharp and dull pain Laying down helps   Seen Dr. Onesimo

## 2023-10-07 DIAGNOSIS — E039 Hypothyroidism, unspecified: Secondary | ICD-10-CM | POA: Diagnosis not present

## 2023-10-07 DIAGNOSIS — J449 Chronic obstructive pulmonary disease, unspecified: Secondary | ICD-10-CM | POA: Diagnosis not present

## 2023-10-07 DIAGNOSIS — M1612 Unilateral primary osteoarthritis, left hip: Secondary | ICD-10-CM | POA: Diagnosis not present

## 2023-10-07 DIAGNOSIS — E559 Vitamin D deficiency, unspecified: Secondary | ICD-10-CM | POA: Diagnosis not present

## 2023-10-07 DIAGNOSIS — I482 Chronic atrial fibrillation, unspecified: Secondary | ICD-10-CM | POA: Diagnosis not present

## 2023-10-07 DIAGNOSIS — R296 Repeated falls: Secondary | ICD-10-CM | POA: Diagnosis not present

## 2023-10-07 DIAGNOSIS — R9389 Abnormal findings on diagnostic imaging of other specified body structures: Secondary | ICD-10-CM | POA: Diagnosis not present

## 2023-10-07 DIAGNOSIS — R419 Unspecified symptoms and signs involving cognitive functions and awareness: Secondary | ICD-10-CM | POA: Diagnosis not present

## 2023-10-07 DIAGNOSIS — R7301 Impaired fasting glucose: Secondary | ICD-10-CM | POA: Diagnosis not present

## 2023-10-07 DIAGNOSIS — G629 Polyneuropathy, unspecified: Secondary | ICD-10-CM | POA: Diagnosis not present

## 2023-10-07 DIAGNOSIS — M79672 Pain in left foot: Secondary | ICD-10-CM | POA: Diagnosis not present

## 2023-10-07 DIAGNOSIS — F419 Anxiety disorder, unspecified: Secondary | ICD-10-CM | POA: Diagnosis not present

## 2023-10-07 DIAGNOSIS — I10 Essential (primary) hypertension: Secondary | ICD-10-CM | POA: Diagnosis not present

## 2023-10-07 DIAGNOSIS — I739 Peripheral vascular disease, unspecified: Secondary | ICD-10-CM | POA: Diagnosis not present

## 2023-10-07 DIAGNOSIS — S2232XA Fracture of one rib, left side, initial encounter for closed fracture: Secondary | ICD-10-CM | POA: Diagnosis not present

## 2023-10-07 DIAGNOSIS — I420 Dilated cardiomyopathy: Secondary | ICD-10-CM | POA: Diagnosis not present

## 2023-10-07 DIAGNOSIS — M79671 Pain in right foot: Secondary | ICD-10-CM | POA: Diagnosis not present

## 2023-10-07 DIAGNOSIS — L110 Acquired keratosis follicularis: Secondary | ICD-10-CM | POA: Diagnosis not present

## 2023-10-08 DIAGNOSIS — F419 Anxiety disorder, unspecified: Secondary | ICD-10-CM | POA: Diagnosis not present

## 2023-10-08 DIAGNOSIS — F330 Major depressive disorder, recurrent, mild: Secondary | ICD-10-CM | POA: Diagnosis not present

## 2023-10-09 ENCOUNTER — Encounter: Payer: Self-pay | Admitting: Gastroenterology

## 2023-10-09 DIAGNOSIS — E782 Mixed hyperlipidemia: Secondary | ICD-10-CM | POA: Diagnosis not present

## 2023-10-09 DIAGNOSIS — F5105 Insomnia due to other mental disorder: Secondary | ICD-10-CM | POA: Diagnosis not present

## 2023-10-09 DIAGNOSIS — F419 Anxiety disorder, unspecified: Secondary | ICD-10-CM | POA: Diagnosis not present

## 2023-10-09 DIAGNOSIS — E1165 Type 2 diabetes mellitus with hyperglycemia: Secondary | ICD-10-CM | POA: Diagnosis not present

## 2023-10-09 DIAGNOSIS — I10 Essential (primary) hypertension: Secondary | ICD-10-CM | POA: Diagnosis not present

## 2023-10-09 DIAGNOSIS — J449 Chronic obstructive pulmonary disease, unspecified: Secondary | ICD-10-CM | POA: Diagnosis not present

## 2023-10-09 DIAGNOSIS — F03A0 Unspecified dementia, mild, without behavioral disturbance, psychotic disturbance, mood disturbance, and anxiety: Secondary | ICD-10-CM | POA: Diagnosis not present

## 2023-10-09 DIAGNOSIS — F3341 Major depressive disorder, recurrent, in partial remission: Secondary | ICD-10-CM | POA: Diagnosis not present

## 2023-10-10 ENCOUNTER — Telehealth: Payer: Self-pay | Admitting: Radiology

## 2023-10-10 NOTE — Telephone Encounter (Signed)
 Patient called and said her MRI is not until 10/12/23, is she ok to have hip injection on 10/13/23?  Please call her to discuss.

## 2023-10-12 ENCOUNTER — Ambulatory Visit (HOSPITAL_COMMUNITY)

## 2023-10-13 ENCOUNTER — Ambulatory Visit: Admitting: Physical Medicine and Rehabilitation

## 2023-10-15 DIAGNOSIS — F419 Anxiety disorder, unspecified: Secondary | ICD-10-CM | POA: Diagnosis not present

## 2023-10-15 DIAGNOSIS — F3341 Major depressive disorder, recurrent, in partial remission: Secondary | ICD-10-CM | POA: Diagnosis not present

## 2023-10-16 ENCOUNTER — Ambulatory Visit (INDEPENDENT_AMBULATORY_CARE_PROVIDER_SITE_OTHER): Admitting: Physical Medicine and Rehabilitation

## 2023-10-16 ENCOUNTER — Other Ambulatory Visit: Payer: Self-pay

## 2023-10-16 DIAGNOSIS — M25552 Pain in left hip: Secondary | ICD-10-CM

## 2023-10-16 MED ORDER — BUPIVACAINE HCL 0.25 % IJ SOLN
4.0000 mL | INTRAMUSCULAR | Status: AC | PRN
  Administered 2023-10-16: 4 mL via INTRA_ARTICULAR

## 2023-10-16 MED ORDER — TRIAMCINOLONE ACETONIDE 40 MG/ML IJ SUSP
40.0000 mg | INTRAMUSCULAR | Status: AC | PRN
  Administered 2023-10-16: 40 mg via INTRA_ARTICULAR

## 2023-10-16 NOTE — Progress Notes (Signed)
   Amanda Palmer - 89 y.o. female MRN [id number]  Date of birth: [birth year]  Office Visit Note: Visit Date: 10/16/2023 PCP: Shona Norleen PEDLAR, MD Referred by: Shona Norleen PEDLAR, MD  Subjective: Chief Complaint  Patient presents with   Left Hip - Pain   HPI:  Amanda Palmer is a 89 y.o. female who comes in today at the request of Duwaine Pouch, FNP for planned Left anesthetic hip arthrogram with fluoroscopic guidance.  The patient has failed conservative care including home exercise, medications, time and activity modification.  This injection will be diagnostic and hopefully therapeutic.  Please see requesting physician notes for further details and justification.    ROS Otherwise per HPI.  Assessment & Plan: Visit Diagnoses:    ICD-10-CM   1. Pain in left hip  M25.552 Large Joint Inj: L hip joint    XR C-ARM NO REPORT      Plan: No additional findings.   Meds & Orders: No orders of the defined types were placed in this encounter.   Orders Placed This Encounter  Procedures   Large Joint Inj: L hip joint   XR C-ARM NO REPORT    Follow-up: No follow-ups on file.   Procedures: Large Joint Inj: L hip joint on 10/16/2023 1:46 PM Indications: diagnostic evaluation and pain Details: 22 G 3.5 in needle, fluoroscopy-guided anterior approach  Arthrogram: No  Medications: 4 mL bupivacaine  0.25 %; 40 mg triamcinolone  acetonide 40 MG/ML Outcome: tolerated well, no immediate complications  There was excellent flow of contrast producing a partial arthrogram of the hip. The patient did have relief of symptoms during the anesthetic phase of the injection. Procedure, treatment alternatives, risks and benefits explained, specific risks discussed. Consent was given by the patient. Immediately prior to procedure a time out was called to verify the correct patient, procedure, equipment, support staff and site/side marked as required. Patient was prepped and draped in the usual sterile fashion.           Clinical History: No specialty comments available.     Objective:  VS:  HT:    WT:   BMI:     BP:   HR: bpm  TEMP: ( )  RESP:  Physical Exam   Imaging: XR C-ARM NO REPORT Result Date: 10/16/2023 Please see Notes tab for imaging impression.

## 2023-10-16 NOTE — Progress Notes (Signed)
 Pain Scale   Average Pain 6 Patient advising she has chronic left hip pain, that is constant only relief is with medication and injection        +Driver, -BT, -Dye Allergies.

## 2023-10-17 ENCOUNTER — Encounter (HOSPITAL_COMMUNITY): Payer: Self-pay | Admitting: Radiology

## 2023-10-17 ENCOUNTER — Ambulatory Visit (HOSPITAL_COMMUNITY)
Admission: RE | Admit: 2023-10-17 | Discharge: 2023-10-17 | Disposition: A | Discharge status: Home / Self Care | Source: Ambulatory Visit | Attending: Physical Medicine and Rehabilitation | Admitting: Physical Medicine and Rehabilitation

## 2023-10-17 DIAGNOSIS — M5416 Radiculopathy, lumbar region: Secondary | ICD-10-CM | POA: Diagnosis not present

## 2023-10-17 DIAGNOSIS — M961 Postlaminectomy syndrome, not elsewhere classified: Secondary | ICD-10-CM | POA: Insufficient documentation

## 2023-10-17 DIAGNOSIS — M549 Dorsalgia, unspecified: Secondary | ICD-10-CM

## 2023-10-17 DIAGNOSIS — M25552 Pain in left hip: Secondary | ICD-10-CM | POA: Diagnosis not present

## 2023-10-17 DIAGNOSIS — R1032 Left lower quadrant pain: Secondary | ICD-10-CM | POA: Diagnosis not present

## 2023-10-17 DIAGNOSIS — M4807 Spinal stenosis, lumbosacral region: Secondary | ICD-10-CM | POA: Diagnosis not present

## 2023-10-17 DIAGNOSIS — G8929 Other chronic pain: Secondary | ICD-10-CM | POA: Insufficient documentation

## 2023-10-17 DIAGNOSIS — M5442 Lumbago with sciatica, left side: Secondary | ICD-10-CM | POA: Insufficient documentation

## 2023-10-17 DIAGNOSIS — M51369 Other intervertebral disc degeneration, lumbar region without mention of lumbar back pain or lower extremity pain: Secondary | ICD-10-CM | POA: Diagnosis not present

## 2023-10-17 DIAGNOSIS — M48061 Spinal stenosis, lumbar region without neurogenic claudication: Secondary | ICD-10-CM | POA: Diagnosis not present

## 2023-10-17 DIAGNOSIS — M47817 Spondylosis without myelopathy or radiculopathy, lumbosacral region: Secondary | ICD-10-CM | POA: Diagnosis not present

## 2023-10-20 DIAGNOSIS — J449 Chronic obstructive pulmonary disease, unspecified: Secondary | ICD-10-CM | POA: Diagnosis not present

## 2023-10-22 DIAGNOSIS — F3341 Major depressive disorder, recurrent, in partial remission: Secondary | ICD-10-CM | POA: Diagnosis not present

## 2023-10-22 DIAGNOSIS — F419 Anxiety disorder, unspecified: Secondary | ICD-10-CM | POA: Diagnosis not present

## 2023-10-23 DIAGNOSIS — F03A0 Unspecified dementia, mild, without behavioral disturbance, psychotic disturbance, mood disturbance, and anxiety: Secondary | ICD-10-CM | POA: Diagnosis not present

## 2023-10-23 DIAGNOSIS — F3341 Major depressive disorder, recurrent, in partial remission: Secondary | ICD-10-CM | POA: Diagnosis not present

## 2023-10-23 DIAGNOSIS — F419 Anxiety disorder, unspecified: Secondary | ICD-10-CM | POA: Diagnosis not present

## 2023-10-23 DIAGNOSIS — F5105 Insomnia due to other mental disorder: Secondary | ICD-10-CM | POA: Diagnosis not present

## 2023-10-29 DIAGNOSIS — F419 Anxiety disorder, unspecified: Secondary | ICD-10-CM | POA: Diagnosis not present

## 2023-10-29 DIAGNOSIS — F330 Major depressive disorder, recurrent, mild: Secondary | ICD-10-CM | POA: Diagnosis not present

## 2023-10-31 DIAGNOSIS — Z9181 History of falling: Secondary | ICD-10-CM | POA: Diagnosis not present

## 2023-10-31 DIAGNOSIS — I420 Dilated cardiomyopathy: Secondary | ICD-10-CM | POA: Diagnosis not present

## 2023-10-31 DIAGNOSIS — R4189 Other symptoms and signs involving cognitive functions and awareness: Secondary | ICD-10-CM | POA: Diagnosis not present

## 2023-10-31 DIAGNOSIS — G4700 Insomnia, unspecified: Secondary | ICD-10-CM | POA: Insufficient documentation

## 2023-10-31 DIAGNOSIS — I10 Essential (primary) hypertension: Secondary | ICD-10-CM | POA: Diagnosis not present

## 2023-10-31 DIAGNOSIS — E782 Mixed hyperlipidemia: Secondary | ICD-10-CM | POA: Diagnosis not present

## 2023-10-31 DIAGNOSIS — D509 Iron deficiency anemia, unspecified: Secondary | ICD-10-CM | POA: Diagnosis not present

## 2023-10-31 DIAGNOSIS — F419 Anxiety disorder, unspecified: Secondary | ICD-10-CM | POA: Diagnosis not present

## 2023-10-31 DIAGNOSIS — J449 Chronic obstructive pulmonary disease, unspecified: Secondary | ICD-10-CM | POA: Diagnosis not present

## 2023-10-31 DIAGNOSIS — I482 Chronic atrial fibrillation, unspecified: Secondary | ICD-10-CM | POA: Diagnosis not present

## 2023-10-31 DIAGNOSIS — M818 Other osteoporosis without current pathological fracture: Secondary | ICD-10-CM | POA: Diagnosis not present

## 2023-10-31 DIAGNOSIS — I739 Peripheral vascular disease, unspecified: Secondary | ICD-10-CM | POA: Diagnosis not present

## 2023-10-31 DIAGNOSIS — K219 Gastro-esophageal reflux disease without esophagitis: Secondary | ICD-10-CM | POA: Diagnosis not present

## 2023-11-03 ENCOUNTER — Encounter: Payer: Self-pay | Admitting: Gastroenterology

## 2023-11-03 ENCOUNTER — Ambulatory Visit: Admitting: Gastroenterology

## 2023-11-03 VITALS — BP 137/64 | HR 55 | Temp 98.6°F | Ht 60.0 in | Wt 140.4 lb

## 2023-11-03 DIAGNOSIS — K219 Gastro-esophageal reflux disease without esophagitis: Secondary | ICD-10-CM

## 2023-11-03 DIAGNOSIS — R1310 Dysphagia, unspecified: Secondary | ICD-10-CM | POA: Diagnosis not present

## 2023-11-03 MED ORDER — ESOMEPRAZOLE MAGNESIUM 40 MG PO CPDR: 40.0000 mg | DELAYED_RELEASE_CAPSULE | Freq: Every day | ORAL | 11 refills | Status: AC

## 2023-11-03 NOTE — Progress Notes (Signed)
 GI Office Note    Referring Provider: Shona Norleen PEDLAR, MD Primary Care Physician:  Shona Norleen PEDLAR, MD  Primary Gastroenterologist:Michael Shaaron, MD   Chief Complaint   Chief Complaint  Patient presents with   Dysphagia    States that her pills and food like potatoes gets stuck    History of Present Illness   Amanda Palmer is a 89 y.o. female presenting today at the request of Dr. Shona for further evaluation of dysphagia. Last seen in November 2023 for dysphagia, chronic GERD.  Discussed the use of AI scribe software for clinical note transcription with the patient, who gave verbal consent to proceed.    She has been experiencing difficulty swallowing capsules and large pills for about two months, with a sensation of pills getting stuck in her throat or chest, causing discomfort. Smaller pills can be swallowed if taken one at a time, but larger ones often get 'hung down here'.  She also experiences odynophagia with solid foods, such as potatoes, and sometimes feels like she is choking. This sensation is accompanied by coughing, which she attributes to her existing COPD. At night, she experiences pain in her chest and radiates up into her neck, which is relieved by drinking carbonated beverages like caffeine-free diet Pepsi.  She has a history of esophageal narrowing, previously treated with dilation procedures. She has had several EGDs with stretching of the esophagus. The last time was in January 2024, during which her esophagus was stretched, and she remembers that it helped at the time. She has undergone similar procedures in the past with positive outcomes.  Her current medications include famotidine  10mg  daily and pantoprazole  40mg  daily for acid reduction. She previously took over-the-counter Nexium , which she found more effective than her current medications. She resides at [place], [place], where her medications are managed and administered.   No issues with BMs. No melena,  brbpr.   Prior Data   EGD January 2024: -Moderate Schatzki ring status post dilation -Normal stomach -Normal duodenal bulb and second portion of duodenum  Thyroid  ultrasound June 2025: IMPRESSION: Markedly heterogeneous background thyroid  gland suggests either chronic thyroiditis, longstanding exogenous thyroid  hormone replacement therapy or prior radioactive iodine ablated therapy.   Several cysts are present bilaterally but no solid nodules that would warrant further evaluation.  CT chest abdomen pelvis with contrast May 2025:  1. Acute displaced anterior left third rib fracture. No other acute displaced fracture or subluxation.  2. Bilateral mild interstitial and alveolar pulmonary edema likely CHF in this patient with multiple cardiac abnormalities, less noncardiogenic.  3. Superimposed bilateral atypical pneumonia on top of alveolar edema not entirely excluded by imaging alone. Correlate clinically. No CT evidence of another acute process in the chest, abdomen, or pelvis.  4. Pulmonary nodules are redemonstrated measuring up to 7 mm, up from 6 mm one year and half ago, likely inflammatory, much less likely infectious.  According to Fleischner Society guidelines, follow-up chest CT in 3-6 months is recommended, with additional follow-up CT at 18-24 months.  5. Multinodular mild borderline thyroid  goiter. Consider outpatient TFTs and ultrasound.  6. Multiple other chronic and incidental findings as above.   Medications   Current Outpatient Medications  Medication Sig Dispense Refill   acetaminophen  (TYLENOL ) 325 MG tablet Take 650 mg by mouth every 6 (six) hours as needed for moderate pain (pain score 4-6).     albuterol  (PROVENTIL ) (2.5 MG/3ML) 0.083% nebulizer solution Take 2.5 mg by nebulization in the morning and  at bedtime.     amiodarone  (PACERONE ) 200 MG tablet TAKE ONE TABLET BY MOUTH ONCE DAILY 30 tablet 5   apixaban  (ELIQUIS ) 2.5 MG TABS tablet Take 1 tablet (2.5 mg  total) by mouth 2 (two) times daily. 60 tablet 6   atorvastatin  (LIPITOR) 20 MG tablet Take 20 mg by mouth at bedtime.     calcium  carbonate (OSCAL) 1500 (600 Ca) MG TABS tablet Take 600 mg of elemental calcium  by mouth 2 (two) times daily with a meal.     cholecalciferol (VITAMIN D3) 25 MCG (1000 UNIT) tablet Take 1,000 Units by mouth daily.     cyanocobalamin 100 MCG tablet Take 100 mcg by mouth daily.     Dextromethorphan-guaiFENesin  5-50 MG/5ML SYRP Take 10 mLs by mouth every 4 (four) hours as needed.     famotidine  (PEPCID ) 10 MG tablet Take 10 mg by mouth at bedtime.     ferrous sulfate 325 (65 FE) MG EC tablet Take 325 mg by mouth daily.     Fluticasone -Umeclidin-Vilant 100-62.5-25 MCG/ACT AEPB Inhale 1 puff into the lungs at bedtime.     gabapentin  (NEURONTIN ) 300 MG capsule Take 300 mg by mouth at bedtime. 1 capsule nightly for neuropathy     guaifenesin  (ROBITUSSIN) 100 MG/5ML syrup Take 100 mg by mouth 3 (three) times daily as needed for cough.     hydrALAZINE  (APRESOLINE ) 25 MG tablet Take 25 mg by mouth 3 (three) times daily.     hydrochlorothiazide  (MICROZIDE ) 12.5 MG capsule Take 12.5 mg by mouth daily.     HYDROcodone -acetaminophen  (NORCO/VICODIN) 5-325 MG tablet Take 1 tablet by mouth every 4 (four) hours as needed. for pain     levalbuterol  (XOPENEX ) 0.63 MG/3ML nebulizer solution Inhale 0.63 mg into the lungs.     levothyroxine (SYNTHROID) 25 MCG tablet Take 25 mcg by mouth daily before breakfast.     Magnesium  Oxide 400 MG CAPS Take 1 capsule (400 mg total) by mouth daily. 90 capsule 3   metoprolol  tartrate (LOPRESSOR ) 25 MG tablet Take 12.5 mg by mouth 2 (two) times daily.     mirtazapine (REMERON) 15 MG tablet Take 15 mg by mouth at bedtime.     ondansetron  (ZOFRAN ) 4 MG tablet Take 4 mg by mouth every 8 (eight) hours as needed for nausea or vomiting. Take 1-2 tablets (4-8 mg) by mouth every eight hours as needed for up to 16 doses     OXYGEN  Inhale 2 L into the lungs  continuous.     pantoprazole  (PROTONIX ) 40 MG tablet Take 40 mg by mouth daily. Take 1 tablet by mouth daily before breakfast     polyethylene glycol (MIRALAX / GLYCOLAX) 17 g packet Take 17 g by mouth at bedtime.     sennosides-docusate sodium (SENOKOT-S) 8.6-50 MG tablet Take 1 tablet by mouth at bedtime.     thiamine (VITAMIN B-1) 100 MG tablet Take 100 mg by mouth daily.     traMADol  (ULTRAM ) 50 MG tablet TAKE 1 TABLET (50 MG TOTAL) BY MOUTH EVERY 12 (TWELVE) HOURS AS NEEDED. 30 tablet 0   triamcinolone  cream (KENALOG ) 0.5 % Apply 1 Application topically in the morning and at bedtime.     valsartan (DIOVAN) 160 MG tablet Take 160 mg by mouth daily.     venlafaxine  (EFFEXOR ) 75 MG tablet Take 225 mg by mouth daily.     furosemide  (LASIX ) 20 MG tablet Take 1 tablet (20 mg total) by mouth as needed for edema (swelling). (Patient not taking:  Reported on 07/17/2023)     No current facility-administered medications for this visit.    Allergies   Allergies as of 11/03/2023 - Review Complete 11/03/2023  Allergen Reaction Noted   Morphine  Nausea And Vomiting 04/12/2013   Atenolol  11/03/2023   Morphine  and codeine Nausea And Vomiting 01/27/2014   Penicillins Rash 01/27/2014   Prednisone  Anxiety, Nausea And Vomiting, and Other (See Comments) 05/21/2014   Sulfa antibiotics Nausea And Vomiting 01/27/2014   Sulfamethoxazole Nausea And Vomiting 04/26/2015     Past Medical History   Past Medical History:  Diagnosis Date   A-fib (HCC)    Anxiety    Arthritis    Cancer (HCC)    breast   COPD (chronic obstructive pulmonary disease) (HCC)    Depression    GERD (gastroesophageal reflux disease)    Hypertension    Nonischemic cardiomyopathy (HCC)    TIA (transient ischemic attack)    remote    Past Surgical History   Past Surgical History:  Procedure Laterality Date   ABDOMINAL HYSTERECTOMY     APPENDECTOMY     BACK SURGERY     total of five surgeries   BREAST BIOPSY Left    benign    BREAST CAPSULECTOMY WITH IMPLANT EXCHANGE Right 05/27/2016   Procedure: REMOVAL AND REPLACEMENT OF RIGHT BREAST IMPLANT AND BREAST CAPSULECTOMY;  Surgeon: Elna Pick, MD;  Location: [facility];  Service: Plastics;  Laterality: Right;   CARDIAC CATHETERIZATION     CARDIOVERSION N/A 05/17/2020   Procedure: CARDIOVERSION;  Surgeon: Alveta Aleene PARAS, MD;  Location: [facility];  Service: Cardiovascular;  Laterality: N/A;   CHOLECYSTECTOMY     COLONOSCOPY  08/2014   Dr. Donnel: Small sessile polyp at the cecum, removed, tubular adenoma   ESOPHAGOGASTRODUODENOSCOPY  06/2013   Dr. Donnel: Hiatal hernia, Schatzki ring, nonobstructive.   MASTECTOMY     right    ORIF ANKLE FRACTURE Left 06/01/2014   Procedure: OPEN REDUCTION INTERNAL FIXATION (ORIF) LEFT ANKLE FRACTURE;  Surgeon: Oneil JAYSON Herald, MD;  Location: [facility] OR;  Service: Orthopedics;  Laterality: Left;   ROTATOR CUFF REPAIR     left   TEE WITHOUT CARDIOVERSION N/A 05/17/2020   Procedure: TRANSESOPHAGEAL ECHOCARDIOGRAM (TEE);  Surgeon: Alveta Aleene PARAS, MD;  Location: [facility];  Service: Cardiovascular;  Laterality: N/A;   TOTAL HIP ARTHROPLASTY     right   TOTAL KNEE ARTHROPLASTY     bilateral    Past Family History   Family History  Problem Relation Age of Onset   Heart disease Brother    Heart disease Sister    Breast cancer Sister    Leukemia Brother    Breast cancer Sister    Breast cancer Other        one deceased, one living   Colon cancer Neg Hx     Past Social History   Social History   Socioeconomic History   Marital status: Widowed    Spouse name: Not on file   Number of children: Not on file   Years of education: 12   Highest education level: 12th grade  Occupational History   Not on file  Tobacco Use   Smoking status: Never    Passive exposure: Never   Smokeless tobacco: Never  Vaping Use   Vaping status: Never Used  Substance and Sexual Activity   Alcohol use: No    Alcohol/week:  0.0 standard drinks of alcohol   Drug use: No   Sexual activity: Not  Currently    Partners: Male  Other Topics Concern   Not on file  Social History Narrative   Not on file   Social Drivers of Health   Financial Resource Strain: Low Risk  (07/04/2023)   Overall Financial Resource Strain (CARDIA)    Difficulty of Paying Living Expenses: Not hard at all  Food Insecurity: No Food Insecurity (07/04/2023)   Hunger Vital Sign    Worried About Running Out of Food in the Last Year: Never true    Ran Out of Food in the Last Year: Never true  Transportation Needs: No Transportation Needs (07/04/2023)   PRAPARE - Administrator, Civil Service (Medical): No    Lack of Transportation (Non-Medical): No  Physical Activity: Inactive (07/04/2023)   Exercise Vital Sign    Days of Exercise per Week: 0 days    Minutes of Exercise per Session: 0 min  Stress: No Stress Concern Present (07/04/2023)   [employer] of Occupational Health - Occupational Stress Questionnaire    Feeling of Stress : Only a little  Social Connections: Moderately Integrated (07/04/2023)   Social Connection and Isolation Panel    Frequency of Communication with Friends and Family: More than three times a week    Frequency of Social Gatherings with Friends and Family: More than three times a week    Attends Religious Services: More than 4 times per year    Active Member of [employer] or Organizations: Yes    Attends Banker Meetings: 1 to 4 times per year    Marital Status: Widowed  Intimate Partner Violence: Not At Risk (07/04/2023)   Humiliation, Afraid, Rape, and Kick questionnaire    Fear of Current or Ex-Partner: No    Emotionally Abused: No    Physically Abused: No    Sexually Abused: No    Review of Systems   General: Negative for anorexia, weight loss, fever, chills, fatigue, weakness. ENT: Negative for hoarseness,  nasal congestion. CV: Negative for chest pain, angina, palpitations, dyspnea  on exertion, peripheral edema.  Respiratory: Negative for dyspnea at rest, dyspnea on exertion, +cough, sputum, wheezing.  GI: See history of present illness. GU:  Negative for dysuria, hematuria, urinary incontinence, urinary frequency, nocturnal urination.  Endo: Negative for unusual weight change.     Physical Exam   BP 137/64 (BP Location: Right Arm, Patient Position: Sitting, Cuff Size: Normal)   Pulse (!) 55   Temp 98.6 F (37 C) (Oral)   Ht 5' (1.524 m)   Wt 140 lb 6.4 oz (63.7 kg)   SpO2 93%   BMI 27.42 kg/m    General: Well-nourished, well-developed in no acute distress.  Eyes: No icterus. Mouth: Oropharyngeal mucosa moist and pink   Lungs: Clear to auscultation bilaterally.  Heart: Regular rate and rhythm, no murmurs rubs or gallops.  Abdomen: Bowel sounds are normal, nontender, nondistended, no hepatosplenomegaly or masses,  no abdominal bruits or hernia , no rebound or guarding.  Rectal: not performed Extremities: No lower extremity edema. No clubbing or deformities. Neuro: Alert and oriented x 4   Skin: Warm and dry, no jaundice.   Psych: Alert and cooperative, normal mood and affect.  Labs   September 26, 2023: Hemoglobin 11, hematocrit 35.2, MCV 97.5, platelets 217, albumin 3.4, total bilirubin 0.2, alk phos 100, AST 13, ALT 51 normal, creatinine 0.94. Imaging Studies   MR LUMBAR SPINE WO CONTRAST Result Date: 10/23/2023 CLINICAL DATA:  Back pain, left hip pain EXAM: MRI LUMBAR  SPINE WITHOUT CONTRAST TECHNIQUE: Multiplanar, multisequence MR imaging of the lumbar spine was performed. No intravenous contrast was administered. COMPARISON:  January 2022 FINDINGS: Bone marrow: There are Modic type 2 changes at L1-2 and L2-3 Conus and cauda equina: No significant abnormality Paraspinal tissues: No significant abnormality L1-L2: There is severe degenerative disc disease. Mild facet arthropathy. There is no spinal stenosis or foraminal stenosis L2-L3: There is severe  degenerative disc disease. Mild facet arthropathy. There is no spinal stenosis or significant foraminal stenosis L3-L4: There is severe degenerative disc disease. Mild facet arthropathy. There is no spinal stenosis. There is mild bilateral neural foraminal stenosis L4-L5: Previous PLIF with posterolateral fusion. No spinal stenosis or foraminal stenosis L5-S1: There is severe degenerative disc disease with a disc bulge that extends laterally on both sides. There is severe bilateral facet arthropathy. There is severe right and moderate left neural foraminal stenosis. IMPRESSION: 1. Previous PLIF with posterolateral fusion at L4-5 and L5-S1 2. There is severe right neural foraminal stenosis and moderate left neural foraminal stenosis at L5-S1 due to severe degenerative disc disease and facet arthropathy. This has worsened since the prior study from March 18, 2020 3. Severe degenerative disc disease at L1-2, L2-3 and L3-4 is unchanged Electronically Signed   By: Nancyann Burns M.D.   On: 10/23/2023 11:10   XR C-ARM NO REPORT Result Date: 10/16/2023 Please see Notes tab for imaging impression.   Assessment/Plan:       GERD/Dysphagia Dysphagia with difficulty swallowing medications. Worse with larger capsules. Notes discomfort in chest when eating certain solid foods such as potatoes. Will have discomfort when pills/food sticks in chest. Has nocturnal symptoms of chest discomfort that is relieved with burping. She feels like nexium  helped more than pantoprazole . Multiple EGDs with esophageal dilation, last time in 03/2022. Has always found them helpful. Ddx: refractory GERD, esophageal motility disorder, esophageal stricture, less likely oropharyngeal dysphagia but cannot rule out  -switch pantoprazole  to esomeprazole  40mg  daily before breakfast.  -continue pepcid  10mg  daily. -BPE initially given fairly recent EGD. May be helpful to rule out Zenker's, evaluate for gross esophageal motility disorder.  -swallow  medication one at a time         Sonny RAMAN. Ezzard, MHS, PA-C [facility]

## 2023-11-03 NOTE — Patient Instructions (Signed)
 Take your medication 1 or 2 at a time since you are having difficulty swallowing.   We are going to stop pantoprazole  and start esomeprazole  40mg  daily before breakfast. RX sent to [facility] in [place].  Xray of your esophagus will be done to evaluate your swallowing concerns.

## 2023-11-04 ENCOUNTER — Encounter: Payer: Self-pay | Admitting: Physical Medicine and Rehabilitation

## 2023-11-04 ENCOUNTER — Ambulatory Visit: Admitting: Physical Medicine and Rehabilitation

## 2023-11-04 DIAGNOSIS — M961 Postlaminectomy syndrome, not elsewhere classified: Secondary | ICD-10-CM | POA: Diagnosis not present

## 2023-11-04 DIAGNOSIS — R1032 Left lower quadrant pain: Secondary | ICD-10-CM | POA: Diagnosis not present

## 2023-11-04 DIAGNOSIS — M5442 Lumbago with sciatica, left side: Secondary | ICD-10-CM | POA: Diagnosis not present

## 2023-11-04 DIAGNOSIS — M25552 Pain in left hip: Secondary | ICD-10-CM

## 2023-11-04 DIAGNOSIS — G8929 Other chronic pain: Secondary | ICD-10-CM

## 2023-11-04 NOTE — Progress Notes (Unsigned)
 Pain Scale   Average Pain 0 Patient advising she has lower back pain radiating to left leg.  Patient advising she is here for an MRI review.        +Driver, -BT, -Dye Allergies.

## 2023-11-04 NOTE — Progress Notes (Unsigned)
 [facility] - 89 y.o. female MRN [id number]  Date of birth: 1933-05-06  Office Visit Note: Visit Date: 11/04/2023 PCP: Shona Norleen PEDLAR, MD Referred by: Shona Norleen PEDLAR, MD  Subjective: Chief Complaint  Patient presents with   Lower Back - Pain   HPI: [facility] is a 89 y.o. female who comes in today for evaluation of chronic, worsening and severe left sided lower back pain radiating to buttock, groin and down anterior thigh to knee. Pain ongoing for several months. She is here today for follow up and lumbar MRI review. Her pain worsens with prolonged standing and walking. She describes pain as dull and aching sensation, currently rates as 1 out of 10. Some relief of pain with home exercise regimen, rest and use of medications. She does take Norco as needed. Recent lumbar MRI imaging shows previous PLIF with posterolateral fusion at L4-5 and L5-S1. There is severe right neural foraminal stenosis and moderate left neural foraminal stenosis at L5-S1 due to severe degenerative disc disease and facet arthropathy, this has worsened from 2022. History of (5) total lumbar surgeries several years ago in [place]. Recent left hip radiographs show advanced degenerative changes with complete loss of joint space. She recently underwent left intra-articular hip injection in our office on 10/16/2023, she reports significant and sustained pain relief with injection, greater than 80%. Also reports increased functional ability post injection. She is currently using cane to assist with ambulation. No recent trauma or falls.   Patients course is complicated by atrial fibrillation and COPD. Does take Eliquis . She currently resides at [facility] in [place].         Review of Systems  Musculoskeletal:  Positive for back pain and joint pain.  Neurological:  Negative for tingling, sensory change, focal weakness and weakness.  All other systems reviewed and are negative.  Otherwise per HPI.  Assessment &  Plan: Visit Diagnoses:    ICD-10-CM   1. Chronic left-sided low back pain with left-sided sciatica  M54.42    G89.29     2. Pain in left hip  M25.552     3. Groin pain, chronic, left  R10.32    G89.29     4. Post laminectomy syndrome  M96.1        Plan: Findings:  Chronic, worsening and severe left sided lower back pain radiating to buttock, groin and down anterior thigh to knee. Her pain has significantly improved post left intra-articular hip injection. Her functional ability has increased post injection. She continues to have issues with standing and walking, there is advanced arthritic changes to left hip on recent x-rays. Dr. Onesimo did speak with her about hip replacement, however her advanced age and co-morbidities may limit her from being a surgical candidate. I discussed recent lumbar MRI with her today using spine model. There are findings on new imaging that can certainly cause pain, however she seems to be doing well post left hip injection. I would like her to follow up with Dr. Onesimo as needed. We can look at repeating left hip injection as needed. No red flag symptoms noted upon exam today.     Meds & Orders: No orders of the defined types were placed in this encounter.  No orders of the defined types were placed in this encounter.   Follow-up: Return if symptoms worsen or fail to improve.   Procedures: No procedures performed      Clinical History: CLINICAL DATA:  Back pain, left hip pain  EXAM: MRI LUMBAR SPINE WITHOUT CONTRAST   TECHNIQUE: Multiplanar, multisequence MR imaging of the lumbar spine was performed. No intravenous contrast was administered.   COMPARISON:  January 2022   FINDINGS: Bone marrow: There are Modic type 2 changes at L1-2 and L2-3   Conus and cauda equina: No significant abnormality   Paraspinal tissues: No significant abnormality   L1-L2: There is severe degenerative disc disease. Mild facet arthropathy. There is no spinal  stenosis or foraminal stenosis   L2-L3: There is severe degenerative disc disease. Mild facet arthropathy. There is no spinal stenosis or significant foraminal stenosis   L3-L4: There is severe degenerative disc disease. Mild facet arthropathy. There is no spinal stenosis. There is mild bilateral neural foraminal stenosis   L4-L5: Previous PLIF with posterolateral fusion. No spinal stenosis or foraminal stenosis   L5-S1: There is severe degenerative disc disease with a disc bulge that extends laterally on both sides. There is severe bilateral facet arthropathy. There is severe right and moderate left neural foraminal stenosis.   IMPRESSION: 1. Previous PLIF with posterolateral fusion at L4-5 and L5-S1 2. There is severe right neural foraminal stenosis and moderate left neural foraminal stenosis at L5-S1 due to severe degenerative disc disease and facet arthropathy. This has worsened since the prior study from March 18, 2020 3. Severe degenerative disc disease at L1-2, L2-3 and L3-4 is unchanged     Electronically Signed   By: Nancyann Burns M.D.   On: 10/23/2023 11:10   She reports that she has never smoked. She has never been exposed to tobacco smoke. She has never used smokeless tobacco. No results for input(s): HGBA1C, LABURIC in the last 8760 hours.  Objective:  VS:  HT:    WT:   BMI:     BP:   HR: bpm  TEMP: ( )  RESP:  Physical Exam Vitals and nursing note reviewed.  HENT:     Head: Normocephalic and atraumatic.     Right Ear: External ear normal.     Left Ear: External ear normal.     Mouth/Throat:     Mouth: Mucous membranes are moist.  Eyes:     Extraocular Movements: Extraocular movements intact.  Cardiovascular:     Rate and Rhythm: Normal rate.     Pulses: Normal pulses.  Pulmonary:     Effort: Pulmonary effort is normal.  Abdominal:     General: Abdomen is flat. There is no distension.  Musculoskeletal:        General: Tenderness present.      Cervical back: Normal range of motion.     Comments: Patient is slow to rise from seated position to standing. Good lumbar range of motion. No pain noted with facet loading. 5/5 strength noted with bilateral hip flexion, knee flexion/extension, ankle dorsiflexion/plantarflexion and EHL. No clonus noted bilaterally. Mild tenderness noted upon palpation of greater trochanters. No pain with internal/external rotation of bilateral hips. Sensation intact bilaterally. Negative slump test bilaterally. Ambulates with cane, gait slow and unsteady.   Skin:    General: Skin is warm and dry.     Capillary Refill: Capillary refill takes less than 2 seconds.  Neurological:     Mental Status: She is alert and oriented to person, place, and time.     Gait: Gait abnormal.  Psychiatric:        Mood and Affect: Mood normal.        Behavior: Behavior normal.     Ortho Exam  Imaging: No  results found.  Past Medical/Family/Surgical/Social History: Medications & Allergies reviewed per EMR, new medications updated. Patient Active Problem List   Diagnosis Date Noted   Nocturnal hypoxemia 06/03/2023   Acute cystitis 04/03/2023   Acute viral syndrome 04/03/2023   Influenza A 04/03/2023   Nausea 04/03/2023   Nursing home resident 04/03/2023   Altered mental status 03/15/2023   Hypertensive emergency 03/15/2023   Prolonged QT interval 03/25/2022   Lung nodule 03/25/2022   Periorbital hematoma of left eye 03/25/2022   Dysphagia 01/30/2022   Dizziness and giddiness 08/09/2021   Weakness 08/09/2021   History of falling 08/09/2021   Dehydration 08/09/2021   Long term (current) use of anticoagulants 07/09/2021   Dependence on supplemental oxygen  07/09/2021   Prsnl hx of TIA (TIA), and cereb infrc w/o resid deficits 03/11/2021   Permanent atrial fibrillation (HCC) 05/09/2020   Secondary hypercoagulable state (HCC) 05/09/2020   Abnormal CT of the chest 07/06/2019   Closed displaced fracture of fifth  metatarsal bone of left foot 01/11/2019   Hematoma of arm, right, initial encounter 08/24/2018   Gastro-esophageal reflux disease without esophagitis 10/22/2016   H/O adenomatous polyp of colon 10/22/2016   Posterior vitreous detachment of both eyes 04/10/2016   Pseudophakia 04/10/2016   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   Vocal fold atrophy 08/16/2015   Cough 03/29/2015   Upper airway cough syndrome 03/29/2015   Dyspnea 02/23/2015   Trimalleolar fracture of ankle, closed 05/31/2014   Syncope and collapse 05/31/2014   Scalp laceration 05/31/2014   ARF (acute renal failure) (HCC) 05/31/2014   Syncope 05/31/2014   COPD mixed type (HCC) 05/22/2014   COPD exacerbation (HCC) 05/22/2014   Arrhythmia 05/17/2014   Chest pain 04/12/2013   Cerebrovascular disease 08/01/2011   Esophageal reflux 08/01/2011   Depressive disorder 08/01/2011   Gastritis and duodenitis 08/01/2011   History of breast cancer 08/01/2011   Hyperlipidemia 08/01/2011   Polypharmacy 08/01/2011   Vitamin D deficiency 08/01/2011   Anxiety state 08/01/2011   Essential (primary) hypertension 08/01/2011   DDD (degenerative disc disease), cervical 08/01/2011   Past Medical History:  Diagnosis Date   A-fib (HCC)    Anxiety    Arthritis    Cancer (HCC)    breast   COPD (chronic obstructive pulmonary disease) (HCC)    Depression    GERD (gastroesophageal reflux disease)    Hypertension    Nonischemic cardiomyopathy (HCC)    TIA (transient ischemic attack)    remote   Family History  Problem Relation Age of Onset   Heart disease Brother    Heart disease Sister    Breast cancer Sister    Leukemia Brother    Breast cancer Sister    Breast cancer Other        one deceased, one living   Colon cancer Neg Hx    Past Surgical History:  Procedure Laterality Date   ABDOMINAL HYSTERECTOMY     APPENDECTOMY     BACK SURGERY     total of five surgeries   BREAST BIOPSY Left    benign   BREAST CAPSULECTOMY WITH  IMPLANT EXCHANGE Right 05/27/2016   Procedure: REMOVAL AND REPLACEMENT OF RIGHT BREAST IMPLANT AND BREAST CAPSULECTOMY;  Surgeon: Elna Pick, MD;  Location: [facility];  Service: Plastics;  Laterality: Right;   CARDIAC CATHETERIZATION     CARDIOVERSION N/A 05/17/2020   Procedure: CARDIOVERSION;  Surgeon: Nahser, Aleene PARAS, MD;  Location: [facility] ENDOSCOPY;  Service: Cardiovascular;  Laterality: N/A;   CHOLECYSTECTOMY  COLONOSCOPY  08/2014   Dr. Donnel: Small sessile polyp at the cecum, removed, tubular adenoma   ESOPHAGOGASTRODUODENOSCOPY  06/2013   Dr. Donnel: Hiatal hernia, Schatzki ring, nonobstructive.   MASTECTOMY     right    ORIF ANKLE FRACTURE Left 06/01/2014   Procedure: OPEN REDUCTION INTERNAL FIXATION (ORIF) LEFT ANKLE FRACTURE;  Surgeon: Oneil JAYSON Herald, MD;  Location: [facility] OR;  Service: Orthopedics;  Laterality: Left;   ROTATOR CUFF REPAIR     left   TEE WITHOUT CARDIOVERSION N/A 05/17/2020   Procedure: TRANSESOPHAGEAL ECHOCARDIOGRAM (TEE);  Surgeon: Alveta Aleene PARAS, MD;  Location: [facility] ENDOSCOPY;  Service: Cardiovascular;  Laterality: N/A;   TOTAL HIP ARTHROPLASTY     right   TOTAL KNEE ARTHROPLASTY     bilateral   Social History   Occupational History   Not on file  Tobacco Use   Smoking status: Never    Passive exposure: Never   Smokeless tobacco: Never  Vaping Use   Vaping status: Never Used  Substance and Sexual Activity   Alcohol use: No    Alcohol/week: 0.0 standard drinks of alcohol   Drug use: No   Sexual activity: Not Currently    Partners: Male

## 2023-11-05 DIAGNOSIS — F330 Major depressive disorder, recurrent, mild: Secondary | ICD-10-CM | POA: Diagnosis not present

## 2023-11-05 DIAGNOSIS — F419 Anxiety disorder, unspecified: Secondary | ICD-10-CM | POA: Diagnosis not present

## 2023-11-06 DIAGNOSIS — I10 Essential (primary) hypertension: Secondary | ICD-10-CM | POA: Diagnosis not present

## 2023-11-06 DIAGNOSIS — R1310 Dysphagia, unspecified: Secondary | ICD-10-CM | POA: Diagnosis not present

## 2023-11-06 DIAGNOSIS — J449 Chronic obstructive pulmonary disease, unspecified: Secondary | ICD-10-CM | POA: Diagnosis not present

## 2023-11-06 DIAGNOSIS — F419 Anxiety disorder, unspecified: Secondary | ICD-10-CM | POA: Diagnosis not present

## 2023-11-06 DIAGNOSIS — I739 Peripheral vascular disease, unspecified: Secondary | ICD-10-CM | POA: Diagnosis not present

## 2023-11-06 DIAGNOSIS — R9389 Abnormal findings on diagnostic imaging of other specified body structures: Secondary | ICD-10-CM | POA: Diagnosis not present

## 2023-11-06 DIAGNOSIS — I420 Dilated cardiomyopathy: Secondary | ICD-10-CM | POA: Diagnosis not present

## 2023-11-06 DIAGNOSIS — R419 Unspecified symptoms and signs involving cognitive functions and awareness: Secondary | ICD-10-CM | POA: Diagnosis not present

## 2023-11-06 DIAGNOSIS — M1612 Unilateral primary osteoarthritis, left hip: Secondary | ICD-10-CM | POA: Diagnosis not present

## 2023-11-06 DIAGNOSIS — G629 Polyneuropathy, unspecified: Secondary | ICD-10-CM | POA: Diagnosis not present

## 2023-11-06 DIAGNOSIS — E039 Hypothyroidism, unspecified: Secondary | ICD-10-CM | POA: Diagnosis not present

## 2023-11-06 DIAGNOSIS — I482 Chronic atrial fibrillation, unspecified: Secondary | ICD-10-CM | POA: Diagnosis not present

## 2023-11-07 DIAGNOSIS — I10 Essential (primary) hypertension: Secondary | ICD-10-CM | POA: Diagnosis not present

## 2023-11-07 DIAGNOSIS — I482 Chronic atrial fibrillation, unspecified: Secondary | ICD-10-CM | POA: Diagnosis not present

## 2023-11-07 DIAGNOSIS — E782 Mixed hyperlipidemia: Secondary | ICD-10-CM | POA: Diagnosis not present

## 2023-11-07 DIAGNOSIS — J449 Chronic obstructive pulmonary disease, unspecified: Secondary | ICD-10-CM | POA: Diagnosis not present

## 2023-11-12 ENCOUNTER — Other Ambulatory Visit: Payer: Self-pay | Admitting: Gastroenterology

## 2023-11-12 ENCOUNTER — Ambulatory Visit (HOSPITAL_COMMUNITY)
Admission: RE | Admit: 2023-11-12 | Discharge: 2023-11-12 | Disposition: A | Discharge status: Home / Self Care | Source: Ambulatory Visit | Attending: Gastroenterology | Admitting: Gastroenterology

## 2023-11-12 DIAGNOSIS — R1310 Dysphagia, unspecified: Secondary | ICD-10-CM

## 2023-11-12 DIAGNOSIS — K219 Gastro-esophageal reflux disease without esophagitis: Secondary | ICD-10-CM

## 2023-11-12 DIAGNOSIS — K224 Dyskinesia of esophagus: Secondary | ICD-10-CM | POA: Diagnosis not present

## 2023-11-12 DIAGNOSIS — F330 Major depressive disorder, recurrent, mild: Secondary | ICD-10-CM | POA: Diagnosis not present

## 2023-11-12 DIAGNOSIS — F419 Anxiety disorder, unspecified: Secondary | ICD-10-CM | POA: Diagnosis not present

## 2023-11-13 ENCOUNTER — Ambulatory Visit: Payer: Self-pay | Admitting: Gastroenterology

## 2023-11-14 ENCOUNTER — Encounter: Payer: Self-pay | Admitting: Cardiovascular Disease

## 2023-11-14 ENCOUNTER — Ambulatory Visit: Attending: Cardiovascular Disease | Admitting: Cardiovascular Disease

## 2023-11-14 VITALS — BP 168/62 | HR 62 | Ht 60.0 in | Wt 143.8 lb

## 2023-11-14 DIAGNOSIS — Z79899 Other long term (current) drug therapy: Secondary | ICD-10-CM

## 2023-11-14 DIAGNOSIS — I480 Paroxysmal atrial fibrillation: Secondary | ICD-10-CM | POA: Diagnosis not present

## 2023-11-14 NOTE — Patient Instructions (Signed)
 Medication Instructions:   Continue all current medications.   Labwork:  CMET - order given today   Testing/Procedures:  A chest x-ray takes a picture of the organs and structures inside the chest, including the heart, lungs, and blood vessels. This test can show several things, including, whether the heart is enlarges; whether fluid is building up in the lungs; and whether pacemaker / defibrillator leads are still in place.   Follow-Up:  Office will contact with results via phone, letter or mychart.    6 months - afib clinic   Any Other Special Instructions Will Be Listed Below (If Applicable).   If you need a refill on your cardiac medications before your next appointment, please call your pharmacy.
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
## 2023-11-17 ENCOUNTER — Other Ambulatory Visit (HOSPITAL_COMMUNITY)
Admission: RE | Admit: 2023-11-17 | Discharge: 2023-11-17 | Disposition: A | Discharge status: Home / Self Care | Source: Ambulatory Visit | Attending: Cardiovascular Disease | Admitting: Cardiovascular Disease

## 2023-11-17 ENCOUNTER — Ambulatory Visit: Payer: Self-pay

## 2023-11-17 ENCOUNTER — Ambulatory Visit (HOSPITAL_COMMUNITY)
Admission: RE | Admit: 2023-11-17 | Discharge: 2023-11-17 | Disposition: A | Discharge status: Home / Self Care | Source: Ambulatory Visit | Attending: Cardiovascular Disease

## 2023-11-17 DIAGNOSIS — I480 Paroxysmal atrial fibrillation: Secondary | ICD-10-CM

## 2023-11-17 DIAGNOSIS — Z79899 Other long term (current) drug therapy: Secondary | ICD-10-CM | POA: Insufficient documentation

## 2023-11-17 DIAGNOSIS — J984 Other disorders of lung: Secondary | ICD-10-CM | POA: Diagnosis not present

## 2023-11-17 LAB — COMPREHENSIVE METABOLIC PANEL WITH GFR
ALT: 23 U/L (ref 0–44)
AST: 22 U/L (ref 15–41)
Albumin: 3.6 g/dL (ref 3.5–5.0)
Alkaline Phosphatase: 70 U/L (ref 38–126)
Anion gap: 6 (ref 5–15)
BUN: 22 mg/dL (ref 8–23)
CO2: 29 mmol/L (ref 22–32)
Calcium: 9.2 mg/dL (ref 8.9–10.3)
Chloride: 95 mmol/L — ABNORMAL LOW (ref 98–111)
Creatinine, Ser: 0.92 mg/dL (ref 0.44–1.00)
GFR, Estimated: 60 mL/min — ABNORMAL LOW (ref >60)
Glucose, Bld: 75 mg/dL (ref 70–99)
Potassium: 4.6 mmol/L (ref 3.5–5.1)
Sodium: 130 mmol/L — ABNORMAL LOW (ref 135–145)
Total Bilirubin: 0.7 mg/dL (ref 0.0–1.2)
Total Protein: 6.7 g/dL (ref 6.5–8.1)

## 2023-11-19 DIAGNOSIS — R4189 Other symptoms and signs involving cognitive functions and awareness: Secondary | ICD-10-CM | POA: Diagnosis not present

## 2023-11-19 DIAGNOSIS — M818 Other osteoporosis without current pathological fracture: Secondary | ICD-10-CM | POA: Diagnosis not present

## 2023-11-19 DIAGNOSIS — F419 Anxiety disorder, unspecified: Secondary | ICD-10-CM | POA: Diagnosis not present

## 2023-11-19 DIAGNOSIS — J449 Chronic obstructive pulmonary disease, unspecified: Secondary | ICD-10-CM | POA: Diagnosis not present

## 2023-11-19 DIAGNOSIS — I10 Essential (primary) hypertension: Secondary | ICD-10-CM | POA: Diagnosis not present

## 2023-11-19 DIAGNOSIS — I482 Chronic atrial fibrillation, unspecified: Secondary | ICD-10-CM | POA: Diagnosis not present

## 2023-11-19 DIAGNOSIS — F330 Major depressive disorder, recurrent, mild: Secondary | ICD-10-CM | POA: Diagnosis not present

## 2023-11-19 DIAGNOSIS — E782 Mixed hyperlipidemia: Secondary | ICD-10-CM | POA: Diagnosis not present

## 2023-11-19 DIAGNOSIS — I739 Peripheral vascular disease, unspecified: Secondary | ICD-10-CM | POA: Diagnosis not present

## 2023-11-19 DIAGNOSIS — D509 Iron deficiency anemia, unspecified: Secondary | ICD-10-CM | POA: Diagnosis not present

## 2023-11-19 DIAGNOSIS — I420 Dilated cardiomyopathy: Secondary | ICD-10-CM | POA: Diagnosis not present

## 2023-11-19 DIAGNOSIS — K219 Gastro-esophageal reflux disease without esophagitis: Secondary | ICD-10-CM | POA: Diagnosis not present

## 2023-11-19 DIAGNOSIS — Z9181 History of falling: Secondary | ICD-10-CM | POA: Diagnosis not present

## 2023-11-20 DIAGNOSIS — F03A0 Unspecified dementia, mild, without behavioral disturbance, psychotic disturbance, mood disturbance, and anxiety: Secondary | ICD-10-CM | POA: Diagnosis not present

## 2023-11-20 DIAGNOSIS — J449 Chronic obstructive pulmonary disease, unspecified: Secondary | ICD-10-CM | POA: Diagnosis not present

## 2023-11-20 DIAGNOSIS — F330 Major depressive disorder, recurrent, mild: Secondary | ICD-10-CM | POA: Diagnosis not present

## 2023-11-24 DIAGNOSIS — N898 Other specified noninflammatory disorders of vagina: Secondary | ICD-10-CM | POA: Insufficient documentation

## 2023-11-24 DIAGNOSIS — I420 Dilated cardiomyopathy: Secondary | ICD-10-CM | POA: Diagnosis not present

## 2023-11-25 DIAGNOSIS — F330 Major depressive disorder, recurrent, mild: Secondary | ICD-10-CM | POA: Diagnosis not present

## 2023-11-25 DIAGNOSIS — Z0120 Encounter for dental examination and cleaning without abnormal findings: Secondary | ICD-10-CM | POA: Diagnosis not present

## 2023-11-27 ENCOUNTER — Other Ambulatory Visit: Payer: Self-pay | Admitting: *Deleted

## 2023-11-27 DIAGNOSIS — K644 Residual hemorrhoidal skin tags: Secondary | ICD-10-CM | POA: Insufficient documentation

## 2023-11-27 DIAGNOSIS — R4189 Other symptoms and signs involving cognitive functions and awareness: Secondary | ICD-10-CM | POA: Insufficient documentation

## 2023-11-27 DIAGNOSIS — I420 Dilated cardiomyopathy: Secondary | ICD-10-CM | POA: Insufficient documentation

## 2023-11-27 DIAGNOSIS — Z9181 History of falling: Secondary | ICD-10-CM | POA: Insufficient documentation

## 2023-11-27 NOTE — Patient Outreach (Signed)
 Complex Care Management   Visit Note  12/19/2023 updated for 11/27/23  Name:  Amanda Palmer MRN: [id number] DOB: Feb 17, 1934  Situation: Referral received for Complex Care Management related to COPD I obtained verbal consent from Caregiver.  Visit completed with Caregiver  on the phone  Amanda Palmer remains at the [facility] assisted living She is making friends and still goes out with her niece Amanda Palmer for appointments Patient and friends in facility walk around the facility, goes to bingo, concerts, birthday parties Amanda Palmer reports her depression is better as pcp is changing her medications as needed   [place] Dr Linzy on 11/26/23 in [place] Purchased hemorrhoid for bleeding & pain also purchased her fluid pills  Now getting medicines correct at the assisted living facility   Having issues with her room mate reporting the patient snores  Her niece has offered solutions to this with noise cancelling head phones   This visit voiced concerns Insomnia/pain with hip so she now sleeps mainly in her recliner vs her facility bed - has denied choices offered by Amanda Palmer for purchasing a hospital bed or trying a sleep study as she refuses to wear a CPAP if offer. She is ordered to wear oxygen  that would help with fatigue/insomnia Quetion if patient is following treatment plan  Change in appetite- at the assisted living she is offered a menu to choose from but Amanda Palmer reports the patient often does not like the options available to her so eats other items/snacks Is being seen by Dr Debbie GI- next visit Monday December 29 2023 Weight at pcp office on 11/06/23 was 143 lbs with clothes on, weight on 11/03/23 at GI office was 140 lbs, on 11/14/23 her weight was 143 lbs  This indicates a fluctuating gain/loss of 3 lbs  Amanda Palmer voiced understanding that this needs be addressed by GI during the next visit + possible further testing/offer of appetite stimulator etc Amanda Palmer voiced understanding  of keeping up with any weight loss values   Amanda Palmer her niece continues to have some back concerns but is receiving treatment Amanda Palmer, care giver is managing care giver stress She plans to go to the beach this weekend  Voiced understanding about possibly getting gently used rolling walker from the [facility] in [place] or if it has been more than 5 years she can request a prescription for a new one.   Confirms her pcp office has connected her with an outreach from [employer] every 2 weeks and to the office pharmacy staff, [facility] Amanda Palmer voices the understanding that this RN CM will not be outreaching in the future Appreciation voiced by Amanda Palmer for all services rendered   Background:   Past Medical History:  Diagnosis Date   A-fib (HCC)    Anxiety    Arthritis    Cancer (HCC)    breast   COPD (chronic obstructive pulmonary disease) (HCC)    Depression    GERD (gastroesophageal reflux disease)    Hypertension    Nonischemic cardiomyopathy (HCC)    TIA (transient ischemic attack)    remote    Assessment: Patient Reported Symptoms:  Cognitive        Neurological      HEENT        Cardiovascular      Respiratory      Endocrine      Gastrointestinal        Genitourinary      Integumentary  Musculoskeletal          Psychosocial            12/19/2023    PHQ2-9 Depression Screening   Little interest or pleasure in doing things    Feeling down, depressed, or hopeless    PHQ-2 - Total Score    Trouble falling or staying asleep, or sleeping too much    Feeling tired or having little energy    Poor appetite or overeating     Feeling bad about yourself - or that you are a failure or have let yourself or your family down    Trouble concentrating on things, such as reading the newspaper or watching television    Moving or speaking so slowly that other people could have noticed.  Or the opposite - being so fidgety or restless that you have been moving around a  lot more than usual    Thoughts that you would be better off dead, or hurting yourself in some way    PHQ2-9 Total Score    If you checked off any problems, how difficult have these problems made it for you to do your work, take care of things at home, or get along with other people    Depression Interventions/Treatment      There were no vitals filed for this visit.  Medications Reviewed Today     Reviewed by Amanda Suzen CROME, RN (Registered Nurse) on 11/27/23 at 1227  Med List Status: <None>   Medication Order Taking? Sig Documenting Provider Last Dose Status Informant  acetaminophen  (TYLENOL ) 325 MG tablet [id number] Yes Take 650 mg by mouth every 6 (six) hours as needed for moderate pain (pain score 4-6). [provider]  Active Family Member  albuterol  (PROVENTIL ) (2.5 MG/3ML) 0.083% nebulizer solution [id number]  Take 2.5 mg by nebulization in the morning and at bedtime. [provider]  Active Family Member  amiodarone  (PACERONE ) 200 MG tablet [id number]  TAKE ONE TABLET BY MOUTH ONCE DAILY Branch, Dorn FALCON, MD  Active   apixaban  (ELIQUIS ) 2.5 MG TABS tablet [id number]  Take 1 tablet (2.5 mg total) by mouth 2 (two) times daily. Mealor, Augustus E, MD  Active   atorvastatin  (LIPITOR) 20 MG tablet [id number]  Take 20 mg by mouth at bedtime. [provider]  Active Family Member  calcium  carbonate (OSCAL) 1500 (600 Ca) MG TABS tablet [id number]  Take 600 mg of elemental calcium  by mouth 2 (two) times daily with a meal. [provider]  Active   cholecalciferol (VITAMIN D3) 25 MCG (1000 UNIT) tablet [id number] Yes Take 1,000 Units by mouth daily. [provider]  Active   cyanocobalamin 100 MCG tablet [id number]  Take 100 mcg by mouth daily. Kopel, Andrew L, PA  Active   Dextromethorphan-guaiFENesin  5-50 MG/5ML SYRP [id number]  Take 10 mLs by mouth every 4 (four) hours as needed. [provider]  Active   esomeprazole  (NEXIUM ) 40 MG capsule  [id number]  Take 1 capsule (40 mg total) by mouth daily before breakfast. Ezzard Sonny RAMAN, PA-C  Active   famotidine  (PEPCID ) 10 MG tablet [id number]  Take 10 mg by mouth at bedtime. [provider]  Active            Med Note LONZO, Mililani Murthy L   Tue May 06, 2023  5:14 PM) Taking pepcid  10 mg daily not 20 mg  ferrous sulfate 325 (65 FE) MG EC tablet [id number]  Take 325 mg by mouth daily. Kopel, Andrew L, PA  Active   Fluticasone -Umeclidin-Vilant 100-62.5-25 MCG/ACT AEPB [id number]  Inhale 1 puff into the lungs at bedtime. [provider]  Active   furosemide  (LASIX ) 20 MG tablet [id number]  Take 1 tablet (20 mg total) by mouth as needed for edema (swelling). Alvan Dorn FALCON, MD  Active Family Member           Med Note LONZO SUZEN Palmer   Thu Jul 17, 2023 11:36 AM) Not taking at [facility] per [facility] reports patient active  gabapentin  (NEURONTIN ) 300 MG capsule [id number]  Take 300 mg by mouth at bedtime. 1 capsule nightly for neuropathy Anthoney Prentice CROME, PA  Active            Med Note LONZO, SUZEN Palmer   Thu Jul 17, 2023 11:38 AM) [facility] does not have on the list  guaifenesin  (ROBITUSSIN) 100 MG/5ML syrup [id number]  Take 100 mg by mouth 3 (three) times daily as needed for cough. [provider]  Active Family Member  hydrALAZINE  (APRESOLINE ) 25 MG tablet [id number]  Take 25 mg by mouth 3 (three) times daily. Kopel, Andrew L, PA  Active   hydrochlorothiazide  (MICROZIDE ) 12.5 MG capsule [id number]  Take 12.5 mg by mouth daily. [provider]  Active   HYDROcodone -acetaminophen  (NORCO/VICODIN) 5-325 MG tablet [id number] Yes Take 1 tablet by mouth every 4 (four) hours as needed. for pain [provider]  Active   levothyroxine (SYNTHROID) 25 MCG tablet [id number] Yes Take 25 mcg by mouth daily before breakfast. [provider]  Active   levothyroxine (SYNTHROID) 75 MCG tablet [id number] Yes Take 75 mcg by mouth daily. [provider]  Active    Magnesium  Oxide 400 MG CAPS [id number] Yes Take 1 capsule (400 mg total) by mouth daily. Lesia Ozell Prentice, PA-C  Active Family Member  metoprolol  tartrate (LOPRESSOR ) 25 MG tablet [id number]  Take 12.5 mg by mouth 2 (two) times daily. [provider]  Active   mirtazapine (REMERON) 15 MG tablet [id number]  Take 15 mg by mouth at bedtime. [provider]  Active   ondansetron  (ZOFRAN ) 4 MG tablet [id number]  Take 4 mg by mouth every 8 (eight) hours as needed for nausea or vomiting. Take 1-2 tablets (4-8 mg) by mouth every eight hours as needed for up to 16 doses Kopel, Andrew L, [place]  Active   OXYGEN  [id number] Yes Inhale 2 L into the lungs continuous. [provider]  Active Family Member  polyethylene glycol (MIRALAX / GLYCOLAX) 17 g packet [id number]  Take 17 g by mouth at bedtime. [provider]  Active   sennosides-docusate sodium (SENOKOT-S) 8.6-50 MG tablet [id number] Yes Take 1 tablet by mouth at bedtime. Kopel, Andrew L, PA  Active   sertraline (ZOLOFT) 25 MG tablet [id number] Yes Take 25 mg by mouth daily. [provider]  Active   thiamine (VITAMIN B-1) 100 MG tablet [id number]  Take 100 mg by mouth daily. Kopel, Andrew L, PA  Active   traMADol  (ULTRAM ) 50 MG tablet [id number]  TAKE 1 TABLET (50 MG TOTAL) BY MOUTH EVERY 12 (TWELVE) HOURS AS NEEDED. Onesimo Oneil LABOR, MD  Active   triamcinolone  cream (KENALOG ) 0.5 % [id number] Yes Apply 1 Application topically in the morning and at bedtime. [provider]  Active Family Member  valsartan (DIOVAN) 160 MG tablet [id number]  Take 160 mg by mouth daily. [provider]  Expired 11/14/23 2359   venlafaxine  (EFFEXOR ) 75 MG tablet [id number]  Take 225 mg by mouth daily.  Patient not  taking: Reported on 11/14/2023   [provider]  Active Family Member           Med Note ELSWORTH SHARLET BROCKS   Dju May 21, 2014 10:30 PM)    venlafaxine  (EFFEXOR ) 75 MG tablet [id number] Yes Take 75 mg by  mouth 2 (two) times daily. [provider]  Active             Recommendation:   02/16/24 PCP Follow-up Specialty provider follow-up Dr Debbie on 12/29/23- needs discussion/assessment/treatment for any changes in weight or appetite in 2 months--History of dysphagia (pills & food getting stuck with swallowing),  chronic GERD, several moderate Schatzki ring dilation, preferred choices of food  Follow Up Plan:   [facility] Complex care management case closure with continued every 2 week outreach from [employer] ([place] [facility]) next on 12/02/23  and [facility] as needed  [employer]. Ramonita, RN, BSN, CCM [facility], [facility] RN Care Manager Direct Dial: [phone]  Fax: [phone]

## 2023-12-02 DIAGNOSIS — F330 Major depressive disorder, recurrent, mild: Secondary | ICD-10-CM | POA: Diagnosis not present

## 2023-12-03 DIAGNOSIS — Z9181 History of falling: Secondary | ICD-10-CM | POA: Diagnosis not present

## 2023-12-03 DIAGNOSIS — M818 Other osteoporosis without current pathological fracture: Secondary | ICD-10-CM | POA: Diagnosis not present

## 2023-12-03 DIAGNOSIS — I420 Dilated cardiomyopathy: Secondary | ICD-10-CM | POA: Diagnosis not present

## 2023-12-03 DIAGNOSIS — K219 Gastro-esophageal reflux disease without esophagitis: Secondary | ICD-10-CM | POA: Diagnosis not present

## 2023-12-03 DIAGNOSIS — F419 Anxiety disorder, unspecified: Secondary | ICD-10-CM | POA: Diagnosis not present

## 2023-12-03 DIAGNOSIS — I482 Chronic atrial fibrillation, unspecified: Secondary | ICD-10-CM | POA: Diagnosis not present

## 2023-12-03 DIAGNOSIS — I739 Peripheral vascular disease, unspecified: Secondary | ICD-10-CM | POA: Diagnosis not present

## 2023-12-03 DIAGNOSIS — J449 Chronic obstructive pulmonary disease, unspecified: Secondary | ICD-10-CM | POA: Diagnosis not present

## 2023-12-03 DIAGNOSIS — D509 Iron deficiency anemia, unspecified: Secondary | ICD-10-CM | POA: Diagnosis not present

## 2023-12-03 DIAGNOSIS — N898 Other specified noninflammatory disorders of vagina: Secondary | ICD-10-CM | POA: Diagnosis not present

## 2023-12-03 DIAGNOSIS — I10 Essential (primary) hypertension: Secondary | ICD-10-CM | POA: Diagnosis not present

## 2023-12-03 DIAGNOSIS — R4189 Other symptoms and signs involving cognitive functions and awareness: Secondary | ICD-10-CM | POA: Diagnosis not present

## 2023-12-05 DIAGNOSIS — F331 Major depressive disorder, recurrent, moderate: Secondary | ICD-10-CM | POA: Diagnosis not present

## 2023-12-05 DIAGNOSIS — F411 Generalized anxiety disorder: Secondary | ICD-10-CM | POA: Diagnosis not present

## 2023-12-08 DIAGNOSIS — I10 Essential (primary) hypertension: Secondary | ICD-10-CM | POA: Diagnosis not present

## 2023-12-08 DIAGNOSIS — F331 Major depressive disorder, recurrent, moderate: Secondary | ICD-10-CM | POA: Diagnosis not present

## 2023-12-08 DIAGNOSIS — E1165 Type 2 diabetes mellitus with hyperglycemia: Secondary | ICD-10-CM | POA: Diagnosis not present

## 2023-12-08 DIAGNOSIS — E782 Mixed hyperlipidemia: Secondary | ICD-10-CM | POA: Diagnosis not present

## 2023-12-08 DIAGNOSIS — J449 Chronic obstructive pulmonary disease, unspecified: Secondary | ICD-10-CM | POA: Diagnosis not present

## 2023-12-09 DIAGNOSIS — R195 Other fecal abnormalities: Secondary | ICD-10-CM | POA: Diagnosis not present

## 2023-12-15 ENCOUNTER — Other Ambulatory Visit (HOSPITAL_COMMUNITY): Payer: Self-pay | Admitting: Pulmonary Disease

## 2023-12-15 DIAGNOSIS — F0390 Unspecified dementia without behavioral disturbance: Secondary | ICD-10-CM | POA: Diagnosis not present

## 2023-12-15 DIAGNOSIS — R911 Solitary pulmonary nodule: Secondary | ICD-10-CM

## 2023-12-15 DIAGNOSIS — F330 Major depressive disorder, recurrent, mild: Secondary | ICD-10-CM | POA: Diagnosis not present

## 2023-12-17 DIAGNOSIS — M79672 Pain in left foot: Secondary | ICD-10-CM | POA: Diagnosis not present

## 2023-12-17 DIAGNOSIS — I739 Peripheral vascular disease, unspecified: Secondary | ICD-10-CM | POA: Diagnosis not present

## 2023-12-17 DIAGNOSIS — L110 Acquired keratosis follicularis: Secondary | ICD-10-CM | POA: Diagnosis not present

## 2023-12-17 DIAGNOSIS — M79671 Pain in right foot: Secondary | ICD-10-CM | POA: Diagnosis not present

## 2023-12-18 DIAGNOSIS — F03A0 Unspecified dementia, mild, without behavioral disturbance, psychotic disturbance, mood disturbance, and anxiety: Secondary | ICD-10-CM | POA: Diagnosis not present

## 2023-12-18 DIAGNOSIS — F330 Major depressive disorder, recurrent, mild: Secondary | ICD-10-CM | POA: Diagnosis not present

## 2023-12-20 ENCOUNTER — Ambulatory Visit (HOSPITAL_COMMUNITY)
Admission: RE | Admit: 2023-12-20 | Discharge: 2023-12-20 | Disposition: A | Discharge status: Home / Self Care | Source: Ambulatory Visit | Attending: Pulmonary Disease | Admitting: Pulmonary Disease

## 2023-12-20 ENCOUNTER — Other Ambulatory Visit: Payer: Self-pay

## 2023-12-20 ENCOUNTER — Emergency Department (HOSPITAL_COMMUNITY)

## 2023-12-20 ENCOUNTER — Observation Stay (HOSPITAL_COMMUNITY)
Admission: EM | Admit: 2023-12-20 | Discharge: 2023-12-23 | Disposition: A | Discharge status: Nursing Home (Includes ICF) | Attending: Internal Medicine | Admitting: Internal Medicine

## 2023-12-20 DIAGNOSIS — Z853 Personal history of malignant neoplasm of breast: Secondary | ICD-10-CM | POA: Insufficient documentation

## 2023-12-20 DIAGNOSIS — R911 Solitary pulmonary nodule: Secondary | ICD-10-CM

## 2023-12-20 DIAGNOSIS — R061 Stridor: Secondary | ICD-10-CM | POA: Diagnosis present

## 2023-12-20 DIAGNOSIS — F32A Depression, unspecified: Secondary | ICD-10-CM | POA: Insufficient documentation

## 2023-12-20 DIAGNOSIS — I5032 Chronic diastolic (congestive) heart failure: Secondary | ICD-10-CM | POA: Insufficient documentation

## 2023-12-20 DIAGNOSIS — J441 Chronic obstructive pulmonary disease with (acute) exacerbation: Principal | ICD-10-CM | POA: Insufficient documentation

## 2023-12-20 DIAGNOSIS — N179 Acute kidney failure, unspecified: Secondary | ICD-10-CM | POA: Diagnosis present

## 2023-12-20 DIAGNOSIS — Z8673 Personal history of transient ischemic attack (TIA), and cerebral infarction without residual deficits: Secondary | ICD-10-CM | POA: Insufficient documentation

## 2023-12-20 DIAGNOSIS — Z79899 Other long term (current) drug therapy: Secondary | ICD-10-CM | POA: Diagnosis not present

## 2023-12-20 DIAGNOSIS — F419 Anxiety disorder, unspecified: Secondary | ICD-10-CM | POA: Insufficient documentation

## 2023-12-20 DIAGNOSIS — Z7901 Long term (current) use of anticoagulants: Secondary | ICD-10-CM | POA: Insufficient documentation

## 2023-12-20 DIAGNOSIS — I110 Hypertensive heart disease with heart failure: Secondary | ICD-10-CM | POA: Insufficient documentation

## 2023-12-20 DIAGNOSIS — I482 Chronic atrial fibrillation, unspecified: Secondary | ICD-10-CM | POA: Diagnosis not present

## 2023-12-20 DIAGNOSIS — E782 Mixed hyperlipidemia: Secondary | ICD-10-CM | POA: Diagnosis present

## 2023-12-20 DIAGNOSIS — J9621 Acute and chronic respiratory failure with hypoxia: Secondary | ICD-10-CM | POA: Insufficient documentation

## 2023-12-20 DIAGNOSIS — Z7982 Long term (current) use of aspirin: Secondary | ICD-10-CM | POA: Diagnosis not present

## 2023-12-20 DIAGNOSIS — Z66 Do not resuscitate: Secondary | ICD-10-CM | POA: Diagnosis not present

## 2023-12-20 DIAGNOSIS — R918 Other nonspecific abnormal finding of lung field: Secondary | ICD-10-CM | POA: Diagnosis present

## 2023-12-20 DIAGNOSIS — I10 Essential (primary) hypertension: Secondary | ICD-10-CM | POA: Insufficient documentation

## 2023-12-20 DIAGNOSIS — R1310 Dysphagia, unspecified: Secondary | ICD-10-CM | POA: Diagnosis not present

## 2023-12-20 DIAGNOSIS — I428 Other cardiomyopathies: Secondary | ICD-10-CM | POA: Insufficient documentation

## 2023-12-20 DIAGNOSIS — K219 Gastro-esophageal reflux disease without esophagitis: Secondary | ICD-10-CM | POA: Diagnosis present

## 2023-12-20 DIAGNOSIS — Z9981 Dependence on supplemental oxygen: Secondary | ICD-10-CM | POA: Diagnosis not present

## 2023-12-20 DIAGNOSIS — E785 Hyperlipidemia, unspecified: Secondary | ICD-10-CM | POA: Diagnosis not present

## 2023-12-20 DIAGNOSIS — I700 Atherosclerosis of aorta: Secondary | ICD-10-CM | POA: Diagnosis not present

## 2023-12-20 DIAGNOSIS — E041 Nontoxic single thyroid nodule: Secondary | ICD-10-CM | POA: Diagnosis not present

## 2023-12-20 DIAGNOSIS — J449 Chronic obstructive pulmonary disease, unspecified: Secondary | ICD-10-CM | POA: Diagnosis present

## 2023-12-20 DIAGNOSIS — R0602 Shortness of breath: Secondary | ICD-10-CM | POA: Diagnosis not present

## 2023-12-20 DIAGNOSIS — J9611 Chronic respiratory failure with hypoxia: Secondary | ICD-10-CM | POA: Insufficient documentation

## 2023-12-20 DIAGNOSIS — E039 Hypothyroidism, unspecified: Secondary | ICD-10-CM | POA: Diagnosis present

## 2023-12-20 DIAGNOSIS — Q315 Congenital laryngomalacia: Principal | ICD-10-CM

## 2023-12-20 DIAGNOSIS — I4819 Other persistent atrial fibrillation: Secondary | ICD-10-CM | POA: Insufficient documentation

## 2023-12-20 LAB — CBC WITH DIFFERENTIAL/PLATELET
Abs Immature Granulocytes: 0.02 K/uL (ref 0.00–0.07)
Basophils Absolute: 0.1 K/uL (ref 0.0–0.1)
Basophils Relative: 1 %
Eosinophils Absolute: 0.2 K/uL (ref 0.0–0.5)
Eosinophils Relative: 3 %
HCT: 31.3 % — ABNORMAL LOW (ref 36.0–46.0)
Hemoglobin: 10.1 g/dL — ABNORMAL LOW (ref 12.0–15.0)
Immature Granulocytes: 0 %
Lymphocytes Relative: 19 %
Lymphs Abs: 1.3 K/uL (ref 0.7–4.0)
MCH: 32.3 pg (ref 26.0–34.0)
MCHC: 32.3 g/dL (ref 30.0–36.0)
MCV: 100 fL (ref 80.0–100.0)
Monocytes Absolute: 0.5 K/uL (ref 0.1–1.0)
Monocytes Relative: 7 %
Neutro Abs: 4.6 K/uL (ref 1.7–7.7)
Neutrophils Relative %: 70 %
Platelets: 277 K/uL (ref 150–400)
RBC: 3.13 MIL/uL — ABNORMAL LOW (ref 3.87–5.11)
RDW: 14.6 % (ref 11.5–15.5)
WBC: 6.6 K/uL (ref 4.0–10.5)
nRBC: 0 % (ref 0.0–0.2)

## 2023-12-20 LAB — COMPREHENSIVE METABOLIC PANEL WITH GFR
ALT: 19 U/L (ref 0–44)
AST: 25 U/L (ref 15–41)
Albumin: 3.9 g/dL (ref 3.5–5.0)
Alkaline Phosphatase: 94 U/L (ref 38–126)
Anion gap: 13 (ref 5–15)
BUN: 29 mg/dL — ABNORMAL HIGH (ref 8–23)
CO2: 28 mmol/L (ref 22–32)
Calcium: 9.6 mg/dL (ref 8.9–10.3)
Chloride: 100 mmol/L (ref 98–111)
Creatinine, Ser: 1.42 mg/dL — ABNORMAL HIGH (ref 0.44–1.00)
GFR, Estimated: 35 mL/min — ABNORMAL LOW (ref >60)
Glucose, Bld: 138 mg/dL — ABNORMAL HIGH (ref 70–99)
Potassium: 4.2 mmol/L (ref 3.5–5.1)
Sodium: 141 mmol/L (ref 135–145)
Total Bilirubin: 0.3 mg/dL (ref 0.0–1.2)
Total Protein: 7.1 g/dL (ref 6.5–8.1)

## 2023-12-20 MED ORDER — IOHEXOL 300 MG/ML  SOLN
60.0000 mL | Freq: Once | INTRAMUSCULAR | Status: AC | PRN
  Administered 2023-12-20: 60 mL via INTRAVENOUS

## 2023-12-20 MED ORDER — METHYLPREDNISOLONE SODIUM SUCC 125 MG IJ SOLR
125.0000 mg | Freq: Once | INTRAMUSCULAR | Status: AC
Start: 2023-12-20 — End: 2023-12-20
  Administered 2023-12-20: 125 mg via INTRAVENOUS
  Filled 2023-12-20: qty 2

## 2023-12-20 NOTE — ED Provider Notes (Signed)
 [place] EMERGENCY DEPARTMENT AT [facility] Provider Note   CSN: [id number] Arrival date & time: 12/20/23  2053     Patient presents with: Shortness of Breath   Amanda Palmer is a 89 y.o. female.    Shortness of Breath    This patient is an 89 year old female presenting to the hospital with a complaint of difficulty breathing.  This patient had actually been seen earlier today for a CT scan of her lungs, this was a CT scan performed without contrast, the patient is followed by Dr. Shellia with pulmonology, she has a history of pulmonary nodules.  The patient reports that for about a week she has been having some intermittent difficulty with her breathing feeling like she is having trouble breathing.  The paramedics were called to the patient's residence at the nursing facility tonight and found her to be in mild respiratory distress with some stridor, the patient reported to have an allergy  to prednisone  (nausea).  They gave her an albuterol  treatment which did not really help.  On arrival the patient is intermittently speaking in full sentences intermixed with some mild stridor on inspiration.  No edema of the legs, no chest pain, no cough, no fever  Prior to Admission medications   Medication Sig Start Date End Date Taking? Authorizing Provider  acetaminophen  (TYLENOL ) 325 MG tablet Take 650 mg by mouth every 6 (six) hours as needed for moderate pain (pain score 4-6).    [provider]  albuterol  (PROVENTIL ) (2.5 MG/3ML) 0.083% nebulizer solution Take 2.5 mg by nebulization in the morning and at bedtime. 02/05/22   [provider]  amiodarone  (PACERONE ) 200 MG tablet TAKE ONE TABLET BY MOUTH ONCE DAILY 10/29/22   Alvan Dorn FALCON, MD  apixaban  (ELIQUIS ) 2.5 MG TABS tablet Take 1 tablet (2.5 mg total) by mouth 2 (two) times daily. 11/15/22   Mealor, Augustus E, MD  atorvastatin  (LIPITOR) 20 MG tablet Take 20 mg by mouth at bedtime. 09/23/16   [provider]  calcium  carbonate (OSCAL) 1500 (600 Ca) MG TABS tablet Take 600 mg of elemental calcium  by mouth 2 (two) times daily with a meal.    [provider]  cholecalciferol (VITAMIN D3) 25 MCG (1000 UNIT) tablet Take 1,000 Units by mouth daily.    [provider]  cyanocobalamin 100 MCG tablet Take 100 mcg by mouth daily. 04/03/23   Kopel, Andrew L, PA  Dextromethorphan-guaiFENesin  5-50 MG/5ML SYRP Take 10 mLs by mouth every 4 (four) hours as needed.    [provider]  esomeprazole  (NEXIUM ) 40 MG capsule Take 1 capsule (40 mg total) by mouth daily before breakfast. 11/03/23   Ezzard Sonny RAMAN, PA-C  famotidine  (PEPCID ) 10 MG tablet Take 10 mg by mouth at bedtime. 06/12/22   [provider]  ferrous sulfate 325 (65 FE) MG EC tablet Take 325 mg by mouth daily. 04/03/23   Kopel, Andrew L, PA  Fluticasone -Umeclidin-Vilant 100-62.5-25 MCG/ACT AEPB Inhale 1 puff into the lungs at bedtime.    [provider]  furosemide  (LASIX ) 20 MG tablet Take 1 tablet (20 mg total) by mouth as needed for edema (swelling). 01/10/21   Alvan Dorn FALCON, MD  gabapentin  (NEURONTIN ) 300 MG capsule Take 300 mg by mouth at bedtime. 1 capsule nightly for neuropathy 04/03/23   Kopel, Andrew L, PA  guaifenesin  (ROBITUSSIN) 100 MG/5ML syrup Take 100 mg by mouth 3 (three) times daily as needed for cough.    [provider]  hydrALAZINE  (APRESOLINE ) 25 MG tablet Take 25 mg by mouth 3 (three) times daily. 04/03/23   Kopel, Andrew L, PA  hydrochlorothiazide  (MICROZIDE ) 12.5 MG capsule Take 12.5 mg by mouth daily.    [provider]  HYDROcodone -acetaminophen  (NORCO/VICODIN) 5-325 MG tablet Take 1 tablet by mouth every 4 (four) hours as needed. for pain    [provider]  levothyroxine (SYNTHROID) 25 MCG tablet Take 25 mcg by mouth daily before breakfast.    [provider]  levothyroxine (SYNTHROID) 75 MCG tablet Take 75 mcg by mouth daily. 11/12/23    [provider]  Magnesium  Oxide 400 MG CAPS Take 1 capsule (400 mg total) by mouth daily. 07/09/21   Lesia Ozell Barter, PA-C  metoprolol  tartrate (LOPRESSOR ) 25 MG tablet Take 12.5 mg by mouth 2 (two) times daily. 03/25/23   [provider]  mirtazapine (REMERON) 15 MG tablet Take 15 mg by mouth at bedtime.    [provider]  ondansetron  (ZOFRAN ) 4 MG tablet Take 4 mg by mouth every 8 (eight) hours as needed for nausea or vomiting. Take 1-2 tablets (4-8 mg) by mouth every eight hours as needed for up to 16 doses 04/03/23   Kopel, Barter CROME, PA  OXYGEN  Inhale 2 L into the lungs continuous.    [provider]  polyethylene glycol (MIRALAX / GLYCOLAX) 17 g packet Take 17 g by mouth at bedtime.    [provider]  sennosides-docusate sodium (SENOKOT-S) 8.6-50 MG tablet Take 1 tablet by mouth at bedtime. 04/03/23   Kopel, Andrew L, PA  sertraline (ZOLOFT) 25 MG tablet Take 25 mg by mouth daily. 10/31/23   [provider]  thiamine (VITAMIN B-1) 100 MG tablet Take 100 mg by mouth daily. 04/03/23   Kopel, Andrew L, PA  traMADol  (ULTRAM ) 50 MG tablet TAKE 1 TABLET (50 MG TOTAL) BY MOUTH EVERY 12 (TWELVE) HOURS AS NEEDED. 09/02/23   Onesimo Oneil LABOR, MD  triamcinolone  cream (KENALOG ) 0.5 % Apply 1 Application topically in the morning and at bedtime. 02/05/22   [provider]  valsartan (DIOVAN) 160 MG tablet Take 160 mg by mouth daily. 04/22/23 11/14/23  [provider]  venlafaxine  (EFFEXOR ) 75 MG tablet Take 225 mg by mouth daily. Patient not taking: Reported on 11/14/2023    [provider]  venlafaxine  (EFFEXOR ) 75 MG tablet Take 75 mg by mouth 2 (two) times daily.    [provider]    Allergies: Morphine , Atenolol, Morphine  and codeine, Penicillins, Prednisone , Sulfa antibiotics, and Sulfamethoxazole    Review of Systems  Respiratory:  Positive for shortness of breath.   All other systems reviewed and are  negative.   Updated Vital Signs BP (!) 150/45   Pulse 72   Temp 98.3 F (36.8 C) (Oral)   Resp 20   Ht 1.524 m (5')   Wt 65.2 kg   SpO2 100%   BMI 28.07 kg/m   Physical Exam Vitals and nursing note reviewed.  Constitutional:      General: She is not in acute distress.    Appearance: She is well-developed.  HENT:     Head: Normocephalic and atraumatic.     Mouth/Throat:     Pharynx: No oropharyngeal exudate.  Eyes:     General: No scleral icterus.       Right eye: No discharge.        Left eye: No discharge.     Conjunctiva/sclera: Conjunctivae normal.     Pupils: Pupils are  equal, round, and reactive to light.  Neck:     Thyroid : No thyromegaly.     Vascular: No JVD.  Cardiovascular:     Rate and Rhythm: Normal rate and regular rhythm.     Heart sounds: Normal heart sounds. No murmur heard.    No friction rub. No gallop.  Pulmonary:     Effort: Respiratory distress present.     Breath sounds: Normal breath sounds. Stridor present. No wheezing or rales.     Comments: Mild tachypnea, speaks in close to full sentences Abdominal:     General: Bowel sounds are normal. There is no distension.     Palpations: Abdomen is soft. There is no mass.     Tenderness: There is no abdominal tenderness.  Musculoskeletal:        General: No tenderness. Normal range of motion.     Cervical back: Normal range of motion and neck supple.     Right lower leg: No edema.     Left lower leg: No edema.  Lymphadenopathy:     Cervical: No cervical adenopathy.  Skin:    General: Skin is warm and dry.     Findings: No erythema or rash.  Neurological:     Mental Status: She is alert.     Coordination: Coordination normal.  Psychiatric:        Behavior: Behavior normal.     (all labs ordered are listed, but only abnormal results are displayed) Labs Reviewed  CBC WITH DIFFERENTIAL/PLATELET - Abnormal; Notable for the following components:      Result Value   RBC 3.13 (*)     Hemoglobin 10.1 (*)    HCT 31.3 (*)    All other components within normal limits  COMPREHENSIVE METABOLIC PANEL WITH GFR - Abnormal; Notable for the following components:   Glucose, Bld 138 (*)    BUN 29 (*)    Creatinine, Ser 1.42 (*)    GFR, Estimated 35 (*)    All other components within normal limits    EKG: None  Radiology: CT Soft Tissue Neck W Contrast Result Date: 12/20/2023 EXAM: CT NECK WITH CONTRAST 12/20/2023 10:58:29 PM TECHNIQUE: CT of the neck was performed with the administration of intravenous contrast. Multiplanar reformatted images are provided for review. Automated exposure control, iterative reconstruction, and/or weight based adjustment of the mA/kV was utilized to reduce the radiation dose to as low as reasonably achievable. COMPARISON: None available. CLINICAL HISTORY: Epiglottitis or tonsillitis suspected; stridor. increased shortness of breath. FINDINGS: AERODIGESTIVE TRACT: No discrete mass. No edema. Closed glottis, which limits assessment of the larynx. SALIVARY GLANDS: The parotid and submandibular glands are unremarkable. THYROID : Unremarkable. LYMPH NODES: No suspicious cervical lymphadenopathy. SOFT TISSUES: Thyroid  nodule further evaluated on August 22, 2023 ultrasound. BRAIN, ORBITS, SINUSES AND MASTOIDS: No acute abnormality. LUNGS AND MEDIASTINUM: See concurrent CT chest for intrathoracic evaluation. BONES: No focal bone abnormality. IMPRESSION: 1. No acute findings or discrete mass in the neck. 2. See concurrent CT chest for intrathoracic evaluation. Electronically signed by: Gilmore Molt MD 12/20/2023 11:28 PM EDT RP Workstation: [id number]     .Critical Care  Performed by: Cleotilde Rogue, MD Authorized by: Cleotilde Rogue, MD   Critical care provider statement:    Critical care time (minutes):  45   Critical care time was exclusive of:  Separately billable procedures and treating other patients   Critical care was necessary to treat or prevent  imminent or life-threatening deterioration of the following conditions:  Respiratory failure  Critical care was time spent personally by me on the following activities:  Development of treatment plan with patient or surrogate, discussions with consultants, evaluation of patient's response to treatment, examination of patient, obtaining history from patient or surrogate, review of old charts, re-evaluation of patient's condition, pulse oximetry, ordering and review of radiographic studies, ordering and review of laboratory studies and ordering and performing treatments and interventions   I assumed direction of critical care for this patient from another provider in my specialty: no     Care discussed with: admitting provider   Comments:          Medications Ordered in the ED  methylPREDNISolone  sodium succinate (SOLU-MEDROL ) 125 mg/2 mL injection 125 mg (125 mg Intravenous Given 12/20/23 2124)  iohexol  (OMNIPAQUE ) 300 MG/ML solution 60 mL (60 mLs Intravenous Contrast Given 12/20/23 2231)                                    Medical Decision Making Amount and/or Complexity of Data Reviewed Labs: ordered. Radiology: ordered.  Risk Prescription drug management. Decision regarding hospitalization.    This patient presents to the ED for concern of shortness of breath differential diagnosis includes vocal cord dysfunction, would also consider this to be related to epiglottitis, could be a laryngeal abnormality, mass, tumor, infection, vocal cord dysfunction    Additional history obtained   Additional history obtained from Electronic Medical Record External records from outside source obtained and reviewed including medical record including CT scan history and pulmonology notes   Lab Tests:  I Ordered, and personally interpreted labs.  The pertinent results include: Unremarkable CBC and metabolic panel overall, mild renal insufficiency   Imaging Studies ordered:  I ordered  imaging studies including CT scan of the neck shows what they described as no obvious discrete mass in the neck I independently visualized and interpreted imaging which showed possible obstructive disease in that subglottic area I agree with the radiologist interpretation   Medicines ordered and prescription drug management:  I ordered medication including IV Medrol     I have reviewed the patients home medicines and have made adjustments as needed   Problem List / ED Course:  Patient improved with Solu-Medrol , is no longer having active stridor as frequently, no hypoxia no tachycardia, appears more comfortable.  I did discuss the case with the critical care doctor, they have agreed to see the patient in consultation.  I discussed the case with Dr. Melany searles of the hospitalist service will see the patient, write admission orders and request ED to ED transfer to get the patient down towards critical care and ENT.  Per CC, they will see the pt and then get ENT involved if necessary.  They recommend PRN solumedrol every 6 hours and racemic epi as needed.  She has not needed that at this time. I discussed case with Dr. Darra in the ED at [facility] - agrees with accepting transfer to ED.   Social Determinants of Health:  Elderly.        Final diagnoses:  Laryngeal stridor    ED Discharge Orders     None          Cleotilde Rogue, MD 12/20/23 2336

## 2023-12-20 NOTE — ED Notes (Signed)
 Back from CT

## 2023-12-20 NOTE — ED Notes (Signed)
 Patient transported to CT

## 2023-12-20 NOTE — ED Notes (Signed)
 Critical care paged to Dr Cleotilde [phone]

## 2023-12-20 NOTE — ED Triage Notes (Signed)
 Pt bib [facility] from [facility] with complaints of increased shortness of breath. Pt was working on a breathing tx when EMS got to her but they gave her 5mg  albuterol  en route. Pt states that the shob started a week ago. Pt is allergic to prednisone  and states that she gets nauseous when she takes it.

## 2023-12-20 NOTE — ED Notes (Signed)
 [facility] called for transport ED to ED [facility]

## 2023-12-20 NOTE — ED Notes (Addendum)
 Pt placed on 2L NC for comfort.

## 2023-12-21 ENCOUNTER — Encounter (HOSPITAL_COMMUNITY): Payer: Self-pay | Admitting: Internal Medicine

## 2023-12-21 DIAGNOSIS — E782 Mixed hyperlipidemia: Secondary | ICD-10-CM

## 2023-12-21 DIAGNOSIS — Z7401 Bed confinement status: Secondary | ICD-10-CM | POA: Diagnosis not present

## 2023-12-21 DIAGNOSIS — I4819 Other persistent atrial fibrillation: Secondary | ICD-10-CM | POA: Insufficient documentation

## 2023-12-21 DIAGNOSIS — R069 Unspecified abnormalities of breathing: Secondary | ICD-10-CM | POA: Diagnosis not present

## 2023-12-21 DIAGNOSIS — Q315 Congenital laryngomalacia: Secondary | ICD-10-CM | POA: Diagnosis not present

## 2023-12-21 DIAGNOSIS — N179 Acute kidney failure, unspecified: Secondary | ICD-10-CM

## 2023-12-21 DIAGNOSIS — E038 Other specified hypothyroidism: Secondary | ICD-10-CM

## 2023-12-21 DIAGNOSIS — J9601 Acute respiratory failure with hypoxia: Secondary | ICD-10-CM

## 2023-12-21 DIAGNOSIS — R061 Stridor: Secondary | ICD-10-CM | POA: Diagnosis not present

## 2023-12-21 DIAGNOSIS — K219 Gastro-esophageal reflux disease without esophagitis: Secondary | ICD-10-CM

## 2023-12-21 DIAGNOSIS — R918 Other nonspecific abnormal finding of lung field: Secondary | ICD-10-CM

## 2023-12-21 DIAGNOSIS — J449 Chronic obstructive pulmonary disease, unspecified: Secondary | ICD-10-CM | POA: Diagnosis not present

## 2023-12-21 DIAGNOSIS — I959 Hypotension, unspecified: Secondary | ICD-10-CM | POA: Diagnosis not present

## 2023-12-21 LAB — URINALYSIS, W/ REFLEX TO CULTURE (INFECTION SUSPECTED)
Bilirubin Urine: NEGATIVE
Glucose, UA: NEGATIVE mg/dL
Hgb urine dipstick: NEGATIVE
Ketones, ur: NEGATIVE mg/dL
Nitrite: NEGATIVE
Protein, ur: NEGATIVE mg/dL
Specific Gravity, Urine: 1.029 (ref 1.005–1.030)
pH: 6 (ref 5.0–8.0)

## 2023-12-21 LAB — RESPIRATORY PANEL BY PCR

## 2023-12-21 LAB — CBC WITH DIFFERENTIAL/PLATELET
Abs Immature Granulocytes: 0.02 K/uL (ref 0.00–0.07)
Basophils Absolute: 0 K/uL (ref 0.0–0.1)
Basophils Relative: 0 %
Eosinophils Absolute: 0 K/uL (ref 0.0–0.5)
Eosinophils Relative: 0 %
HCT: 28 % — ABNORMAL LOW (ref 36.0–46.0)
Hemoglobin: 8.9 g/dL — ABNORMAL LOW (ref 12.0–15.0)
Immature Granulocytes: 1 %
Lymphocytes Relative: 5 %
Lymphs Abs: 0.2 K/uL — ABNORMAL LOW (ref 0.7–4.0)
MCH: 31.7 pg (ref 26.0–34.0)
MCHC: 31.8 g/dL (ref 30.0–36.0)
MCV: 99.6 fL (ref 80.0–100.0)
Monocytes Absolute: 0 K/uL — ABNORMAL LOW (ref 0.1–1.0)
Monocytes Relative: 1 %
Neutro Abs: 3.4 K/uL (ref 1.7–7.7)
Neutrophils Relative %: 93 %
Platelets: 195 K/uL (ref 150–400)
RBC: 2.81 MIL/uL — ABNORMAL LOW (ref 3.87–5.11)
RDW: 14.3 % (ref 11.5–15.5)
WBC: 3.7 K/uL — ABNORMAL LOW (ref 4.0–10.5)
nRBC: 0 % (ref 0.0–0.2)

## 2023-12-21 LAB — COMPREHENSIVE METABOLIC PANEL WITH GFR
ALT: 17 U/L (ref 0–44)
AST: 22 U/L (ref 15–41)
Albumin: 2.9 g/dL — ABNORMAL LOW (ref 3.5–5.0)
Alkaline Phosphatase: 67 U/L (ref 38–126)
Anion gap: 10 (ref 5–15)
BUN: 25 mg/dL — ABNORMAL HIGH (ref 8–23)
CO2: 24 mmol/L (ref 22–32)
Calcium: 9 mg/dL (ref 8.9–10.3)
Chloride: 101 mmol/L (ref 98–111)
Creatinine, Ser: 1.21 mg/dL — ABNORMAL HIGH (ref 0.44–1.00)
GFR, Estimated: 43 mL/min — ABNORMAL LOW (ref >60)
Glucose, Bld: 152 mg/dL — ABNORMAL HIGH (ref 70–99)
Potassium: 4.5 mmol/L (ref 3.5–5.1)
Sodium: 135 mmol/L (ref 135–145)
Total Bilirubin: 0.5 mg/dL (ref 0.0–1.2)
Total Protein: 6.3 g/dL — ABNORMAL LOW (ref 6.5–8.1)

## 2023-12-21 LAB — RESP PANEL BY RT-PCR (RSV, FLU A&B, COVID)  RVPGX2
Influenza A by PCR: NEGATIVE
Influenza B by PCR: NEGATIVE
Resp Syncytial Virus by PCR: NEGATIVE
SARS Coronavirus 2 by RT PCR: NEGATIVE

## 2023-12-21 LAB — MAGNESIUM: Magnesium: 2.2 mg/dL (ref 1.7–2.4)

## 2023-12-21 MED ORDER — VENLAFAXINE HCL 75 MG PO TABS: 75.0000 mg | ORAL_TABLET | Freq: Two times a day (BID) | ORAL | Status: DC

## 2023-12-21 MED ORDER — LEVOTHYROXINE SODIUM 50 MCG PO TABS: 25.0000 ug | ORAL_TABLET | Freq: Every day | ORAL | Status: DC

## 2023-12-21 MED ORDER — HYDRALAZINE HCL 25 MG PO TABS
25.0000 mg | ORAL_TABLET | Freq: Three times a day (TID) | ORAL | Status: DC
  Administered 2023-12-21 (×2): 25 mg via ORAL
  Filled 2023-12-21 (×3): qty 1

## 2023-12-21 MED ORDER — PANTOPRAZOLE SODIUM 40 MG PO TBEC
40.0000 mg | DELAYED_RELEASE_TABLET | Freq: Every day | ORAL | Status: DC
  Administered 2023-12-21 – 2023-12-23 (×3): 40 mg via ORAL
  Filled 2023-12-21 (×3): qty 1

## 2023-12-21 MED ORDER — ATORVASTATIN CALCIUM 10 MG PO TABS
20.0000 mg | ORAL_TABLET | Freq: Every day | ORAL | Status: DC
  Administered 2023-12-21 – 2023-12-22 (×2): 20 mg via ORAL
  Filled 2023-12-21 (×3): qty 2

## 2023-12-21 MED ORDER — ACETAMINOPHEN 650 MG RE SUPP: 650.0000 mg | Freq: Four times a day (QID) | RECTAL | Status: DC | PRN

## 2023-12-21 MED ORDER — RACEPINEPHRINE HCL 2.25 % IN NEBU: 0.5000 mL | INHALATION_SOLUTION | RESPIRATORY_TRACT | Status: DC | PRN

## 2023-12-21 MED ORDER — SERTRALINE HCL 25 MG PO TABS
25.0000 mg | ORAL_TABLET | Freq: Every day | ORAL | Status: DC
  Administered 2023-12-21 – 2023-12-23 (×3): 25 mg via ORAL
  Filled 2023-12-21 (×3): qty 1

## 2023-12-21 MED ORDER — POLYETHYLENE GLYCOL 3350 17 G PO PACK
17.0000 g | PACK | Freq: Every day | ORAL | Status: DC
  Filled 2023-12-21 (×2): qty 1

## 2023-12-21 MED ORDER — HYDROCHLOROTHIAZIDE 12.5 MG PO TABS
12.5000 mg | ORAL_TABLET | Freq: Every day | ORAL | Status: DC
Start: 2023-12-21 — End: 2023-12-21

## 2023-12-21 MED ORDER — BUDESON-GLYCOPYRROL-FORMOTEROL 160-9-4.8 MCG/ACT IN AERO
2.0000 | INHALATION_SPRAY | Freq: Two times a day (BID) | RESPIRATORY_TRACT | Status: DC
  Administered 2023-12-21 – 2023-12-22 (×3): 2 via RESPIRATORY_TRACT
  Filled 2023-12-21: qty 5.9

## 2023-12-21 MED ORDER — SENNOSIDES-DOCUSATE SODIUM 8.6-50 MG PO TABS
1.0000 | ORAL_TABLET | Freq: Every day | ORAL | Status: DC
  Administered 2023-12-21: 1 via ORAL
  Filled 2023-12-21 (×2): qty 1

## 2023-12-21 MED ORDER — METHYLPREDNISOLONE SODIUM SUCC 125 MG IJ SOLR
120.0000 mg | INTRAMUSCULAR | Status: DC
  Administered 2023-12-21: 120 mg via INTRAVENOUS
  Filled 2023-12-21: qty 2

## 2023-12-21 MED ORDER — LEVOTHYROXINE SODIUM 75 MCG PO TABS
75.0000 ug | ORAL_TABLET | Freq: Every day | ORAL | Status: DC
  Administered 2023-12-21 – 2023-12-23 (×2): 75 ug via ORAL
  Filled 2023-12-21: qty 3
  Filled 2023-12-21: qty 1

## 2023-12-21 MED ORDER — HYDRALAZINE HCL 20 MG/ML IJ SOLN: 10.0000 mg | INTRAMUSCULAR | Status: DC | PRN

## 2023-12-21 MED ORDER — METHYLPREDNISOLONE SODIUM SUCC 40 MG IJ SOLR
40.0000 mg | Freq: Every day | INTRAMUSCULAR | Status: DC
  Administered 2023-12-21: 40 mg via INTRAVENOUS
  Filled 2023-12-21: qty 1

## 2023-12-21 MED ORDER — AZITHROMYCIN 250 MG PO TABS: 250.0000 mg | ORAL_TABLET | Freq: Every day | ORAL | Status: DC

## 2023-12-21 MED ORDER — DOXYCYCLINE HYCLATE 100 MG PO TABS
100.0000 mg | ORAL_TABLET | Freq: Two times a day (BID) | ORAL | Status: DC
  Administered 2023-12-21: 100 mg via ORAL
  Filled 2023-12-21: qty 1

## 2023-12-21 MED ORDER — APIXABAN 2.5 MG PO TABS
2.5000 mg | ORAL_TABLET | Freq: Two times a day (BID) | ORAL | Status: DC
  Administered 2023-12-21 – 2023-12-23 (×5): 2.5 mg via ORAL
  Filled 2023-12-21 (×6): qty 1

## 2023-12-21 MED ORDER — AZITHROMYCIN 500 MG PO TABS
500.0000 mg | ORAL_TABLET | Freq: Every day | ORAL | Status: AC
  Administered 2023-12-21 – 2023-12-23 (×3): 500 mg via ORAL
  Filled 2023-12-21 (×2): qty 1
  Filled 2023-12-21: qty 2

## 2023-12-21 MED ORDER — LACTATED RINGERS IV SOLN: INTRAVENOUS | Status: DC

## 2023-12-21 MED ORDER — GLUCAGON HCL RDNA (DIAGNOSTIC) 1 MG IJ SOLR: 1.0000 mg | INTRAMUSCULAR | Status: DC | PRN

## 2023-12-21 MED ORDER — FAMOTIDINE 20 MG PO TABS
10.0000 mg | ORAL_TABLET | Freq: Every day | ORAL | Status: DC
  Administered 2023-12-21 – 2023-12-22 (×2): 10 mg via ORAL
  Filled 2023-12-21 (×3): qty 1

## 2023-12-21 MED ORDER — AZITHROMYCIN 250 MG PO TABS: 500.0000 mg | ORAL_TABLET | Freq: Every day | ORAL | Status: DC

## 2023-12-21 MED ORDER — AMIODARONE HCL 200 MG PO TABS
200.0000 mg | ORAL_TABLET | Freq: Every day | ORAL | Status: DC
  Administered 2023-12-21 – 2023-12-23 (×3): 200 mg via ORAL
  Filled 2023-12-21 (×3): qty 1

## 2023-12-21 MED ORDER — IPRATROPIUM-ALBUTEROL 0.5-2.5 (3) MG/3ML IN SOLN
3.0000 mL | RESPIRATORY_TRACT | Status: DC | PRN
  Administered 2023-12-21: 3 mL via RESPIRATORY_TRACT
  Filled 2023-12-21: qty 3

## 2023-12-21 MED ORDER — ACETAMINOPHEN 325 MG PO TABS: 650.0000 mg | ORAL_TABLET | Freq: Four times a day (QID) | ORAL | Status: DC | PRN

## 2023-12-21 MED ORDER — MIRTAZAPINE 15 MG PO TABS
15.0000 mg | ORAL_TABLET | Freq: Every day | ORAL | Status: DC
  Administered 2023-12-21 – 2023-12-22 (×2): 15 mg via ORAL
  Filled 2023-12-21 (×3): qty 1

## 2023-12-21 MED ORDER — METOPROLOL TARTRATE 12.5 MG HALF TABLET
12.5000 mg | ORAL_TABLET | Freq: Two times a day (BID) | ORAL | Status: DC
  Administered 2023-12-21 – 2023-12-23 (×5): 12.5 mg via ORAL
  Filled 2023-12-21 (×7): qty 1

## 2023-12-21 MED ORDER — SODIUM CHLORIDE 0.9 % IV SOLN
INTRAVENOUS | Status: AC
Start: 2023-12-21 — End: 2023-12-21

## 2023-12-21 MED ORDER — SODIUM CHLORIDE 0.9 % IV SOLN
2.0000 g | INTRAVENOUS | Status: DC
  Administered 2023-12-21: 2 g via INTRAVENOUS
  Filled 2023-12-21: qty 20

## 2023-12-21 MED ORDER — METOPROLOL TARTRATE 5 MG/5ML IV SOLN: 5.0000 mg | INTRAVENOUS | Status: DC | PRN

## 2023-12-21 NOTE — ED Notes (Signed)
 Pt spoke with friend Dickey and provided an update.  Pt states she wears oxygen  only at night at 2L

## 2023-12-21 NOTE — ED Notes (Signed)
 Pt niece updated on plan of care

## 2023-12-21 NOTE — Assessment & Plan Note (Signed)
Continuing home regimen of daily PPI therapy.  

## 2023-12-21 NOTE — Progress Notes (Signed)
 PROGRESS NOTE    Amanda Palmer  FMW:[id number] DOB: [birth year] DOA: 12/20/2023 PCP: Shona Norleen PEDLAR, MD    Brief Narrative:   89 year old with history of breast cancer 2013, COPD, HTN, HLD, GERD, persistent A-fib, diastolic CHF EF 65%, anxiety/depression comes from [facility] for complaints of shortness of breath ongoing for about 2 weeks.  In the ER patient noted to have concerns of stridor therefore received bronchodilators and steroids.  Patient transferred to [facility] for PCCM and ENT evaluation.  Assessment & Plan:  Acute respiratory distress secondary to stridor Multiple lung nodules COPD, acute exacerbation -CT head and neck does not show any obvious evidence of stridor but there is concerns of multiple pulmonary nodule raising concerns for metastatic disease.  Patient is to be evaluated by pulmonary team and eventually may require ENT. - On empiric doxycycline  - Will check respiratory viral panel - Currently on Solu-Medrol , Pepcid , I-S/flutter valve  Acute kidney injury -Baseline creatinine 0.9, admission creatinine 1.42.  Will continue IV fluids.  Discontinue HCTZ  Paroxysmal atrial fibrillation - Continue Eliquis , amiodarone  and Lopressor   Essential hypertension - HCTZ, hydralazine , metoprolol   GERD without esophagitis - PPI and Pepcid   Hyperlipidemia - Statin  Hypothyroidism - Synthroid  Anxiety/depression - Zoloft, Remeron   DVT prophylaxis: Eliquis      Code Status: Do not attempt resuscitation (DNR) PRE-ARREST INTERVENTIONS DESIRED Family Communication:   Continue hospital stay for significant abnormal breath sounds   PT Follow up Recs:   Subjective: Seen at bedside, tells me her stridor has resolved and breathing is slightly improved.  Still having occasional shortness of breath with some exertion.   Examination:  General exam: Appears calm and comfortable, chronically ill-appearing.  2 L of nasal cannula. Respiratory system:  Bilateral diminished breath sounds Cardiovascular system: S1 & S2 heard, RRR. No JVD, murmurs, rubs, gallops or clicks. No pedal edema. Gastrointestinal system: Abdomen is nondistended, soft and nontender. No organomegaly or masses felt. Normal bowel sounds heard. Central nervous system: Alert and oriented. No focal neurological deficits. Extremities: Symmetric 5 x 5 power. Skin: No rashes, lesions or ulcers Psychiatry: Judgement and insight appear normal. Mood & affect appropriate.                Diet Orders (From admission, onward)     Start     Ordered   12/21/23 0132  Diet NPO time specified Except for: Ice Chips, Sips with Meds  Diet effective now       Question Answer Comment  Except for Ice Chips   Except for Sips with Meds      12/21/23 0132            Objective: Vitals:   12/21/23 0815 12/21/23 0830 12/21/23 0948 12/21/23 1004  BP: (!) 164/58 (!) 162/59  (!) 168/89  Pulse: 74 73  76  Resp: 19 16    Temp:   98.4 F (36.9 C)   TempSrc:   Oral   SpO2: 100% 94%    Weight:      Height:       No intake or output data in the 24 hours ending 12/21/23 1117 Filed Weights   12/20/23 2107  Weight: 65.2 kg    Scheduled Meds:  amiodarone   200 mg Oral Daily   apixaban   2.5 mg Oral BID   atorvastatin   20 mg Oral QHS   budesonide -glycopyrrolate -formoterol  2 puff Inhalation BID   doxycycline   100 mg Oral Q12H   famotidine   10 mg  Oral QHS   hydrALAZINE   25 mg Oral TID   levothyroxine  75 mcg Oral Q0600   methylPREDNISolone  (SOLU-MEDROL ) injection  120 mg Intravenous Q24H   metoprolol  tartrate  12.5 mg Oral BID   mirtazapine  15 mg Oral QHS   pantoprazole   40 mg Oral Daily   polyethylene glycol  17 g Oral QHS   senna-docusate  1 tablet Oral QHS   sertraline  25 mg Oral Daily   Continuous Infusions:  sodium chloride       Nutritional status     Body mass index is 28.07 kg/m.  Data Reviewed:   CBC: Recent Labs  Lab 12/20/23 2116 12/21/23 0543   WBC 6.6 3.7*  NEUTROABS 4.6 3.4  HGB 10.1* 8.9*  HCT 31.3* 28.0*  MCV 100.0 99.6  PLT 277 195   Basic Metabolic Panel: Recent Labs  Lab 12/20/23 2116  NA 141  K 4.2  CL 100  CO2 28  GLUCOSE 138*  BUN 29*  CREATININE 1.42*  CALCIUM  9.6   GFR: Estimated Creatinine Clearance: 23.1 mL/min (A) (by C-G formula based on SCr of 1.42 mg/dL (H)). Liver Function Tests: Recent Labs  Lab 12/20/23 2116  AST 25  ALT 19  ALKPHOS 94  BILITOT 0.3  PROT 7.1  ALBUMIN 3.9   No results for input(s): LIPASE, AMYLASE in the last 168 hours. No results for input(s): AMMONIA in the last 168 hours. Coagulation Profile: No results for input(s): INR, PROTIME in the last 168 hours. Cardiac Enzymes: No results for input(s): CKTOTAL, CKMB, CKMBINDEX, TROPONINI in the last 168 hours. BNP (last 3 results) No results for input(s): PROBNP in the last 8760 hours. HbA1C: No results for input(s): HGBA1C in the last 72 hours. CBG: No results for input(s): GLUCAP in the last 168 hours. Lipid Profile: No results for input(s): CHOL, HDL, LDLCALC, TRIG, CHOLHDL, LDLDIRECT in the last 72 hours. Thyroid  Function Tests: No results for input(s): TSH, T4TOTAL, FREET4, T3FREE, THYROIDAB in the last 72 hours. Anemia Panel: No results for input(s): VITAMINB12, FOLATE, FERRITIN, TIBC, IRON, RETICCTPCT in the last 72 hours. Sepsis Labs: No results for input(s): PROCALCITON, LATICACIDVEN in the last 168 hours.  No results found for this or any previous visit (from the past 240 hours).       Radiology Studies: CT CHEST WO CONTRAST Result Date: 12/20/2023 CLINICAL DATA:  History of breast carcinoma with shortness of breath and cough, initial encounter EXAM: CT CHEST WITHOUT CONTRAST TECHNIQUE: Multidetector CT imaging of the chest was performed following the standard protocol without IV contrast. RADIATION DOSE REDUCTION: This exam was performed  according to the departmental dose-optimization program which includes automated exposure control, adjustment of the mA and/or kV according to patient size and/or use of iterative reconstruction technique. COMPARISON:  11/17/2023, CT from 12/19/2022 FINDINGS: Cardiovascular: Limited due to lack of IV contrast. Atherosclerotic calcifications of the aorta are noted. No aneurysmal dilatation is seen. The heart is at the upper limits of normal in size. Coronary calcifications are noted. Mediastinum/Nodes: Thoracic inlet is within normal limits. Scattered small mediastinal nodes are seen the largest of these in the AP window measures 7.8 mm in short axis. These are felt to likely be reactive in nature. The esophagus as visualized is within normal limits. Lungs/Pleura: Lungs are well aerated bilaterally. Multiple nodules are noted within the right upper lobe. The largest of these measures 13 mm in greatest dimension (37/3). Second 11 mm nodule is noted along the minor fissure within the upper lobe  somewhat spiculated in nature (70/3). Scattered smaller nodules are noted within the lower lobe on the right. Similar findings are noted on the left but to a lesser degree. Largest nodule area with spiculation is seen in the upper lobe on the left with a part solid component identified. This measures up to 17 mm (37/3). Medial left lower lobe nodule is seen on image number 94 series 3 stable from prior CT examination. No effusion is noted. No pneumothorax is seen. Upper Abdomen: Visualized upper abdomen is within normal limits. Musculoskeletal: Degenerative changes of the thoracic spine are seen. Old rib fractures with healing are noted bilaterally. Right breast implant is seen. IMPRESSION: Multiple bilateral pulmonary nodules as described. Given the patient's clinical history the possibility of metastatic disease deserves primary consideration. Further workup can be performed as clinically indicated. Aortic Atherosclerosis  (ICD10-I70.0). Electronically Signed   By: Oneil Devonshire M.D.   On: 12/20/2023 23:50   CT Soft Tissue Neck W Contrast Result Date: 12/20/2023 EXAM: CT NECK WITH CONTRAST 12/20/2023 10:58:29 PM TECHNIQUE: CT of the neck was performed with the administration of intravenous contrast. Multiplanar reformatted images are provided for review. Automated exposure control, iterative reconstruction, and/or weight based adjustment of the mA/kV was utilized to reduce the radiation dose to as low as reasonably achievable. COMPARISON: None available. CLINICAL HISTORY: Epiglottitis or tonsillitis suspected; stridor. increased shortness of breath. FINDINGS: AERODIGESTIVE TRACT: No discrete mass. No edema. Closed glottis, which limits assessment of the larynx. SALIVARY GLANDS: The parotid and submandibular glands are unremarkable. THYROID : Unremarkable. LYMPH NODES: No suspicious cervical lymphadenopathy. SOFT TISSUES: Thyroid  nodule further evaluated on August 22, 2023 ultrasound. BRAIN, ORBITS, SINUSES AND MASTOIDS: No acute abnormality. LUNGS AND MEDIASTINUM: See concurrent CT chest for intrathoracic evaluation. BONES: No focal bone abnormality. IMPRESSION: 1. No acute findings or discrete mass in the neck. 2. See concurrent CT chest for intrathoracic evaluation. Electronically signed by: Gilmore Molt MD 12/20/2023 11:28 PM EDT RP Workstation: [id number]           LOS: 0 days   Time spent= 35 mins    Burgess JAYSON Dare, MD [facility]  If 7PM-7AM, please contact night-coverage  12/21/2023, 11:17 AM

## 2023-12-21 NOTE — ED Notes (Signed)
 Dr. Caleen recommends to trial pt off O2. Pt has been on room air for 20 minutes. O2 95-97%

## 2023-12-21 NOTE — ED Notes (Signed)
 Pt called out saying she needed to use the bed pan. Pt could not urinate at the time. Call light was given and I informed her to press the call light when she felt the urge to go again.

## 2023-12-21 NOTE — Assessment & Plan Note (Signed)
 Patient does not appear to be in concurrent COPD exacerbation Medical management as above Continue Breztri 

## 2023-12-21 NOTE — Assessment & Plan Note (Signed)
 Patient presenting with nearly 2 weeks of increasing shortness of breath and wheezing Upon initial evaluation by the ED care team there was clinical concern for stridor. Patient has since received bronchodilator therapy and systemic steroids with substantial improvement in symptoms EDPs additionally discussed case with pulmonology who recommended transferring the patient to [facility] for formal consultation and possible ENT evaluation after their consultation. CT imaging of the head and neck soft tissues have been ordered and performed at the recommendation of pulmonology which revealed no obvious abnormality or mass. Will continue to monitor and provide as needed bronchodilator therapy and racemic epinephrine Will continue systemic steroids for now although patient has clinically dramatically improved Etiology unclear

## 2023-12-21 NOTE — ED Notes (Signed)
 Pt express she feels food get stuck and unsure what the reason is. Pt says she does find swallowing ice chips and feel that helps break up mucous.

## 2023-12-21 NOTE — Hospital Course (Addendum)
 Brief Narrative:   89 year old with history of breast cancer 2013, COPD, HTN, HLD, GERD, persistent A-fib, diastolic CHF EF 65%, anxiety/depression comes from [facility] for complaints of shortness of breath ongoing for about 2 weeks.  In the ER patient noted to have concerns of stridor therefore received bronchodilators and steroids.  Patient transferred to [facility] for PCCM and ENT evaluation.  Assessment & Plan:  Acute respiratory distress secondary to stridor Multiple lung nodules COPD, acute exacerbation -CT head and neck does not show any obvious evidence of stridor but there is concerns of multiple pulmonary nodule raising concerns for metastatic disease.  Patient is to be evaluated by pulmonary team and eventually may require ENT. - On empiric doxycycline  - Will check respiratory viral panel - Currently on Solu-Medrol , Pepcid , I-S/flutter valve  Acute kidney injury -Baseline creatinine 0.9, admission creatinine 1.42.  Will continue IV fluids.  Discontinue HCTZ  Paroxysmal atrial fibrillation - Continue Eliquis , amiodarone  and Lopressor   Essential hypertension - HCTZ, hydralazine , metoprolol   GERD without esophagitis - PPI and Pepcid   Hyperlipidemia - Statin  Hypothyroidism - Synthroid  Anxiety/depression - Zoloft, Remeron   DVT prophylaxis: Eliquis      Code Status: Do not attempt resuscitation (DNR) PRE-ARREST INTERVENTIONS DESIRED Family Communication:   Continue hospital stay for significant abnormal breath sounds   PT Follow up Recs:   Subjective: Seen at bedside, tells me her stridor has resolved and breathing is slightly improved.  Still having occasional shortness of breath with some exertion.   Examination:  General exam: Appears calm and comfortable, chronically ill-appearing.  2 L of nasal cannula. Respiratory system: Bilateral diminished breath sounds Cardiovascular system: S1 & S2 heard, RRR. No JVD, murmurs, rubs, gallops or clicks.  No pedal edema. Gastrointestinal system: Abdomen is nondistended, soft and nontender. No organomegaly or masses felt. Normal bowel sounds heard. Central nervous system: Alert and oriented. No focal neurological deficits. Extremities: Symmetric 5 x 5 power. Skin: No rashes, lesions or ulcers Psychiatry: Judgement and insight appear normal. Mood & affect appropriate.

## 2023-12-21 NOTE — ED Notes (Signed)
 Pt loaded onto [facility] stretcher and paperwork given to medic. [facility] notified of [facility] departure

## 2023-12-21 NOTE — ED Notes (Signed)
 RN notified Dr. Caleen pt is on 2 L of O2 96%-100%. RN removed pt from 2L to room air. Pt maintaining O2 96-97%

## 2023-12-21 NOTE — Consult Note (Signed)
 NAME:  Amanda Palmer, MRN:  [id number], DOB:  Feb 16, 1934, LOS: 0 ADMISSION DATE:  12/20/2023, CONSULTATION DATE:  12/21/2023 REFERRING MD:  TRH, CHIEF COMPLAINT: Acute hypoxic respiratory failure  History of Present Illness:  Amanda Palmer is a 89 year old female with a past medical history significant for COPD felt secondary to large secondhand smoking exposure, atrial fibrillation, GERD, HTN, nonischemic cardiomyopathy, and prior TIA who presented to the ED at [facility] for complaints of shortness of breath with associated potential stridor or wheezing.  Patient was admitted per hospitalist for management of shortness of breath/stridor and AKI.  Pulmonary consulted for assistance in management.  Of note CT chest was completed and revealed numerous lung nodules bilaterally concerning for potential underlying malignancy.   Pertinent  Medical History  COPD felt secondary to large secondhand smoking exposure, atrial fibrillation, GERD, HTN, nonischemic cardiomyopathy, and prior TIA  Significant Hospital Events: Including procedures, antibiotic start and stop dates in addition to other pertinent events   10/12 presented for shortness of breath with history of COPD, abnormal CT  Interim History / Subjective:  Seen sitting up on ED stretcher with ongoing complaints of shortness of breath and restless leg syndrome  Objective    Blood pressure (!) 164/58, pulse 74, temperature 98 F (36.7 C), temperature source Oral, resp. rate 19, height 5' (1.524 m), weight 65.2 kg, SpO2 100%.       No intake or output data in the 24 hours ending 12/21/23 0817 Filed Weights   12/20/23 2107  Weight: 65.2 kg    Examination: General: Acute on chronic ill-appearing deconditioned elderly female sitting up in bed in no acute distress HEENT: [place]/AT, MM pink/moist, PERRL,  Neuro: Alert and oriented x 3, nonfocal CV: s1s2 regular rate and rhythm, no murmur, rubs, or gallops,  PULM: Clear to auscultation  bilaterally, no increased work of breathing, on 2 L nasal cannula, productive cough GI: soft, bowel sounds active in all 4 quadrants, non-tender, non-distended, tolerating oral dietn Extremities: warm/dry, no edema  Skin: no rashes or lesions   Resolved problem list   Assessment and Plan  Acute hypoxic respiratory failure with reported stridor/wheezing likely secondary to COPD exacerbation -Currently requiring 2 L nasal cannula History of COPD secondary to secondhand smoking -Patient reports she is a never smoker but was exposed to large volumes of secondhand smoke in the workplace and possible asbestos exposure as well Multiple pulmonary nodules P: Continue supplemental oxygen  for sat goal greater than 92% Continue steroids Scheduled bronchodilators may need to transition from inhalers to nebulizers Recommend starting empiric Doxy added given productive cough Will discuss potential need for bronchoscopy with tissue sampling of pulmonary nodules with attending but it may be best to treat acute illness and follow-up CT in the outpatient setting Aspiration precautions Mobilize   Labs   CBC: Recent Labs  Lab 12/20/23 2116 12/21/23 0543  WBC 6.6 3.7*  NEUTROABS 4.6 3.4  HGB 10.1* 8.9*  HCT 31.3* 28.0*  MCV 100.0 99.6  PLT 277 195    Basic Metabolic Panel: Recent Labs  Lab 12/20/23 2116  NA 141  K 4.2  CL 100  CO2 28  GLUCOSE 138*  BUN 29*  CREATININE 1.42*  CALCIUM  9.6   GFR: Estimated Creatinine Clearance: 23.1 mL/min (A) (by C-G formula based on SCr of 1.42 mg/dL (H)). Recent Labs  Lab 12/20/23 2116 12/21/23 0543  WBC 6.6 3.7*    Liver Function Tests: Recent Labs  Lab 12/20/23 2116  AST 25  ALT 19  ALKPHOS 94  BILITOT 0.3  PROT 7.1  ALBUMIN 3.9   No results for input(s): LIPASE, AMYLASE in the last 168 hours. No results for input(s): AMMONIA in the last 168 hours.  ABG    Component Value Date/Time   TCO2 27 03/24/2022 1954      Coagulation Profile: No results for input(s): INR, PROTIME in the last 168 hours.  Cardiac Enzymes: No results for input(s): CKTOTAL, CKMB, CKMBINDEX, TROPONINI in the last 168 hours.  HbA1C: No results found for: HGBA1C  CBG: No results for input(s): GLUCAP in the last 168 hours.  Review of Systems:   Please see the history of present illness. All other systems reviewed and are negative   Past Medical History:  She,  has a past medical history of A-fib (HCC), Anxiety, Arthritis, Cancer (HCC), COPD (chronic obstructive pulmonary disease) (HCC), Depression, GERD (gastroesophageal reflux disease), Hypertension, Nonischemic cardiomyopathy (HCC), and TIA (transient ischemic attack).   Surgical History:   Past Surgical History:  Procedure Laterality Date   ABDOMINAL HYSTERECTOMY     APPENDECTOMY     BACK SURGERY     total of five surgeries   BREAST BIOPSY Left    benign   BREAST CAPSULECTOMY WITH IMPLANT EXCHANGE Right 05/27/2016   Procedure: REMOVAL AND REPLACEMENT OF RIGHT BREAST IMPLANT AND BREAST CAPSULECTOMY;  Surgeon: Elna Pick, MD;  Location: [facility];  Service: Plastics;  Laterality: Right;   CARDIAC CATHETERIZATION     CARDIOVERSION N/A 05/17/2020   Procedure: CARDIOVERSION;  Surgeon: Alveta Aleene PARAS, MD;  Location: [facility] ENDOSCOPY;  Service: Cardiovascular;  Laterality: N/A;   CHOLECYSTECTOMY     COLONOSCOPY  08/2014   Dr. Donnel: Small sessile polyp at the cecum, removed, tubular adenoma   ESOPHAGOGASTRODUODENOSCOPY  06/2013   Dr. Donnel: Hiatal hernia, Schatzki ring, nonobstructive.   MASTECTOMY     right    ORIF ANKLE FRACTURE Left 06/01/2014   Procedure: OPEN REDUCTION INTERNAL FIXATION (ORIF) LEFT ANKLE FRACTURE;  Surgeon: Oneil JAYSON Herald, MD;  Location: [facility] OR;  Service: Orthopedics;  Laterality: Left;   ROTATOR CUFF REPAIR     left   TEE WITHOUT CARDIOVERSION N/A 05/17/2020   Procedure: TRANSESOPHAGEAL ECHOCARDIOGRAM (TEE);   Surgeon: Alveta Aleene PARAS, MD;  Location: [facility] ENDOSCOPY;  Service: Cardiovascular;  Laterality: N/A;   TOTAL HIP ARTHROPLASTY     right   TOTAL KNEE ARTHROPLASTY     bilateral     Social History:   reports that she has never smoked. She has never been exposed to tobacco smoke. She has never used smokeless tobacco. She reports that she does not drink alcohol and does not use drugs.   Family History:  Her family history includes Breast cancer in her sister, sister and another family member; Heart disease in her brother and sister; Leukemia in her brother. There is no history of Colon cancer.   Allergies Allergies  Allergen Reactions   Morphine  Nausea And Vomiting    Other Reaction(s): GI Intolerance   Atenolol     Other Reaction(s): Not available   Morphine  And Codeine Nausea And Vomiting   Penicillins Rash    Reaction: unknown   Prednisone  Anxiety, Nausea And Vomiting and Other (See Comments)    Makes patient not in right state of mind  Other Reaction(s): GI Intolerance  Makes patient not in right state of mind  Other Reaction(s): GI Intolerance  Makes patient not in right state of mind  Other Reaction(s): GI Intolerance  Makes patient not in right state of mind  Other Reaction(s): GI Intolerance  Makes patient not in right state of mind  Other Reaction(s): GI Intolerance   Sulfa Antibiotics Nausea And Vomiting   Sulfamethoxazole Nausea And Vomiting    Other Reaction(s): GI Intolerance     Home Medications  Prior to Admission medications   Medication Sig Start Date End Date Taking? Authorizing Provider  acetaminophen  (TYLENOL ) 325 MG tablet Take 650 mg by mouth every 6 (six) hours as needed for moderate pain (pain score 4-6).   Yes [provider]  amiodarone  (PACERONE ) 200 MG tablet TAKE ONE TABLET BY MOUTH ONCE DAILY 10/29/22  Yes Branch, Dorn FALCON, MD  apixaban  (ELIQUIS ) 2.5 MG TABS tablet Take 1 tablet (2.5 mg total) by mouth 2 (two) times daily. 11/15/22  Yes  Mealor, Augustus E, MD  atorvastatin  (LIPITOR) 20 MG tablet Take 20 mg by mouth at bedtime. 09/23/16  Yes [provider]  calcium  carbonate (OSCAL) 1500 (600 Ca) MG TABS tablet Take 600 mg of elemental calcium  by mouth 2 (two) times daily with a meal.   Yes [provider]  cholecalciferol (VITAMIN D3) 25 MCG (1000 UNIT) tablet Take 1,000 Units by mouth daily.   Yes [provider]  cyanocobalamin 1000 MCG tablet Take 1,000 mcg by mouth daily. 04/03/23  Yes Kopel, Prentice CROME, PA  Dextromethorphan-guaiFENesin  5-50 MG/5ML SYRP Take 10 mLs by mouth every 4 (four) hours as needed.   Yes [provider]  esomeprazole  (NEXIUM ) 40 MG capsule Take 1 capsule (40 mg total) by mouth daily before breakfast. 11/03/23  Yes Ezzard Sonny RAMAN, PA-C  famotidine  (PEPCID ) 10 MG tablet Take 10 mg by mouth daily. 06/12/22  Yes [provider]  ferrous sulfate 325 (65 FE) MG EC tablet Take 325 mg by mouth daily. 04/03/23  Yes Kopel, Prentice CROME, PA  Fluticasone -Umeclidin-Vilant 100-62.5-25 MCG/ACT AEPB Inhale 1 puff into the lungs at bedtime.   Yes [provider]  hydrALAZINE  (APRESOLINE ) 25 MG tablet Take 25 mg by mouth 3 (three) times daily. 04/03/23  Yes Kopel, Prentice CROME, PA  hydrochlorothiazide  (MICROZIDE ) 12.5 MG capsule Take 12.5 mg by mouth daily.   Yes [provider]  HYDROcodone -acetaminophen  (NORCO/VICODIN) 5-325 MG tablet Take 1 tablet by mouth every 4 (four) hours as needed. for pain   Yes [provider]  levalbuterol  (XOPENEX ) 0.63 MG/3ML nebulizer solution Take 0.63 mg by nebulization every 6 (six) hours as needed for wheezing or shortness of breath.   Yes [provider]  levothyroxine (SYNTHROID) 75 MCG tablet Take 75 mcg by mouth daily. 11/12/23  Yes [provider]  Magnesium  Oxide 400 MG CAPS Take 1 capsule (400 mg total) by mouth daily. 07/09/21  Yes Lesia Ozell Prentice, PA-C  metoprolol  tartrate (LOPRESSOR ) 25 MG tablet  Take 12.5 mg by mouth 2 (two) times daily. 03/25/23  Yes [provider]  mirtazapine (REMERON) 15 MG tablet Take 15 mg by mouth at bedtime.   Yes [provider]  ondansetron  (ZOFRAN ) 4 MG tablet Take 4 mg by mouth every 8 (eight) hours as needed for nausea or vomiting. Take 1-2 tablets (4-8 mg) by mouth every eight hours as needed for up to 16 doses 04/03/23  Yes Kopel, Prentice CROME, PA  OXYGEN  Inhale 2 L into the lungs at bedtime.   Yes [provider]  polyethylene glycol (MIRALAX / GLYCOLAX) 17 g packet Take 17 g by mouth at bedtime.   Yes [provider]  sennosides-docusate sodium (SENOKOT-S) 8.6-50 MG tablet Take 1 tablet by mouth at bedtime. 04/03/23  Yes Kopel, Prentice CROME, PA  sertraline (ZOLOFT) 50 MG tablet Take 75 mg by mouth daily. 10/31/23  Yes [provider]  thiamine (VITAMIN B-1) 100 MG tablet Take 100 mg by mouth daily. 04/03/23  Yes Kopel, Prentice CROME, PA  traMADol  (ULTRAM ) 50 MG tablet TAKE 1 TABLET (50 MG TOTAL) BY MOUTH EVERY 12 (TWELVE) HOURS AS NEEDED. 09/02/23  Yes Onesimo Oneil LABOR, MD  triamcinolone  cream (KENALOG ) 0.5 % Apply 1 Application topically in the morning and at bedtime. 02/05/22  Yes [provider]  valsartan (DIOVAN) 160 MG tablet Take 160 mg by mouth daily. 04/22/23 12/20/24 Yes [provider]  venlafaxine  (EFFEXOR ) 75 MG tablet Take 75 mg by mouth 2 (two) times daily. Patient not taking: Reported on 12/21/2023    [provider]     Critical care time: NA  Saidi Santacroce D. Harris, NP-C [facility] Personal contact information can be found on Amion  If no contact or response made please call [phone] 12/21/2023, 8:38 AM

## 2023-12-21 NOTE — ED Notes (Signed)
 This nurse called [facility] to see if they are ready to receive patient. [facility] stated they are ready to receive the patient to the floor.

## 2023-12-21 NOTE — ED Notes (Signed)
 Pt requesting soup. RN message Dr. Caleen to see if diet could be changed from NPO. Pt has ice chips.

## 2023-12-21 NOTE — Assessment & Plan Note (Signed)
.   Resume home regimen of Synthroid 

## 2023-12-21 NOTE — ED Notes (Signed)
 Pt tegaderm on left side changed.

## 2023-12-21 NOTE — Assessment & Plan Note (Signed)
 Numerous lung nodules bilaterally noted on CT imaging of the lungs performed earlier in the day on 10/11 Patient has known history of breast cancer Furthermore several of the nodules appear spiculated  Patient will likely need further malignancy workup.  Will inquire with pulmonology upon their formal consultation as to the best approach.

## 2023-12-21 NOTE — H&P (Signed)
 History and Physical    Patient: Amanda Palmer MRN: [id number] DOA: 12/20/2023  Date of Service: the patient was seen and examined on 12/21/2023  Patient coming from: SNF  Chief Complaint:  Chief Complaint  Patient presents with   Shortness of Breath    HPI:   89 year old female with past medical history of history of breast cancer in 2013, COPD, hypertension, hyperlipidemia, gastroesophageal reflux disease, persistent atrial fibrillation, diastolic congestive heart failure (Echo 03/2022 EF 60-65% G2DD), anxiety/depression who presents to [facility] emergency department with complaints of shortness of breath and wheezing.  Patient explains that approximately 2 weeks ago she began to experience shortness of breath.  Initially the shortness of breath was mild intensity but in the days that followed this became progressively more more severe.  Shortness of breath has been associated with what patient describes as wheezing with ongoing cough productive of white to yellow sputum.  Patient denies any fevers, sick contacts, recent travel or contact with known COVID-19 or influenza infection.  Patient symptoms have become particularly severe in the past 24 hours prior to presentation prompting the patient to present via EMS to [facility] emergency department for evaluation  Upon evaluation in the emergency department patient was found to be in respiratory distress with concerns for stridor.  Patient was administered albuterol  by EMS and route and was additionally given 125 mg of Solu-Medrol  upon arrival.  EDP discussed case with PCCM who recommended transfer to [facility] for face-to-face pulmonology evaluation and possible ENT evaluation and after they see the patient.  Patient underwent CT imaging of the head and neck with contrast which revealed no acute findings or discrete mass in the neck.  The hospitalist group was then called to assess the patient for admission to hospital  prior to initiation of ED to ED transfer to [facility].    Review of Systems: Review of Systems  Respiratory:  Positive for cough, sputum production, shortness of breath and stridor.   All other systems reviewed and are negative.    Past Medical History:  Diagnosis Date   A-fib (HCC)    Anxiety    Arthritis    Cancer (HCC)    breast   COPD (chronic obstructive pulmonary disease) (HCC)    Depression    GERD (gastroesophageal reflux disease)    Hypertension    Nonischemic cardiomyopathy (HCC)    TIA (transient ischemic attack)    remote    Past Surgical History:  Procedure Laterality Date   ABDOMINAL HYSTERECTOMY     APPENDECTOMY     BACK SURGERY     total of five surgeries   BREAST BIOPSY Left    benign   BREAST CAPSULECTOMY WITH IMPLANT EXCHANGE Right 05/27/2016   Procedure: REMOVAL AND REPLACEMENT OF RIGHT BREAST IMPLANT AND BREAST CAPSULECTOMY;  Surgeon: Elna Pick, MD;  Location: [facility];  Service: Plastics;  Laterality: Right;   CARDIAC CATHETERIZATION     CARDIOVERSION N/A 05/17/2020   Procedure: CARDIOVERSION;  Surgeon: Alveta Aleene JINNY, MD;  Location: [facility] ENDOSCOPY;  Service: Cardiovascular;  Laterality: N/A;   CHOLECYSTECTOMY     COLONOSCOPY  08/2014   Dr. Donnel: Small sessile polyp at the cecum, removed, tubular adenoma   ESOPHAGOGASTRODUODENOSCOPY  06/2013   Dr. Donnel: Hiatal hernia, Schatzki ring, nonobstructive.   MASTECTOMY     right    ORIF ANKLE FRACTURE Left 06/01/2014   Procedure: OPEN REDUCTION INTERNAL FIXATION (ORIF) LEFT ANKLE FRACTURE;  Surgeon: Oneil JAYSON Herald,  MD;  Location: [facility] OR;  Service: Orthopedics;  Laterality: Left;   ROTATOR CUFF REPAIR     left   TEE WITHOUT CARDIOVERSION N/A 05/17/2020   Procedure: TRANSESOPHAGEAL ECHOCARDIOGRAM (TEE);  Surgeon: Alveta Aleene PARAS, MD;  Location: [facility] ENDOSCOPY;  Service: Cardiovascular;  Laterality: N/A;   TOTAL HIP ARTHROPLASTY     right   TOTAL KNEE ARTHROPLASTY     bilateral    Social  History:  reports that she has never smoked. She has never been exposed to tobacco smoke. She has never used smokeless tobacco. She reports that she does not drink alcohol and does not use drugs.  Allergies  Allergen Reactions   Morphine  Nausea And Vomiting    Other Reaction(s): GI Intolerance   Atenolol     Other Reaction(s): Not available   Morphine  And Codeine Nausea And Vomiting   Penicillins Rash    Reaction: unknown   Prednisone  Anxiety, Nausea And Vomiting and Other (See Comments)    Makes patient not in right state of mind  Other Reaction(s): GI Intolerance  Makes patient not in right state of mind  Other Reaction(s): GI Intolerance  Makes patient not in right state of mind  Other Reaction(s): GI Intolerance    Makes patient not in right state of mind  Other Reaction(s): GI Intolerance  Makes patient not in right state of mind  Other Reaction(s): GI Intolerance   Sulfa Antibiotics Nausea And Vomiting   Sulfamethoxazole Nausea And Vomiting    Other Reaction(s): GI Intolerance    Family History  Problem Relation Age of Onset   Heart disease Brother    Heart disease Sister    Breast cancer Sister    Leukemia Brother    Breast cancer Sister    Breast cancer Other        one deceased, one living   Colon cancer Neg Hx     Prior to Admission medications   Medication Sig Start Date End Date Taking? Authorizing Provider  acetaminophen  (TYLENOL ) 325 MG tablet Take 650 mg by mouth every 6 (six) hours as needed for moderate pain (pain score 4-6).    [provider]  albuterol  (PROVENTIL ) (2.5 MG/3ML) 0.083% nebulizer solution Take 2.5 mg by nebulization in the morning and at bedtime. 02/05/22   [provider]  amiodarone  (PACERONE ) 200 MG tablet TAKE ONE TABLET BY MOUTH ONCE DAILY 10/29/22   Alvan Dorn FALCON, MD  apixaban  (ELIQUIS ) 2.5 MG TABS tablet Take 1 tablet (2.5 mg total) by mouth 2 (two) times daily. 11/15/22   Mealor, Augustus E, MD  atorvastatin   (LIPITOR) 20 MG tablet Take 20 mg by mouth at bedtime. 09/23/16   [provider]  calcium  carbonate (OSCAL) 1500 (600 Ca) MG TABS tablet Take 600 mg of elemental calcium  by mouth 2 (two) times daily with a meal.    [provider]  cholecalciferol (VITAMIN D3) 25 MCG (1000 UNIT) tablet Take 1,000 Units by mouth daily.    [provider]  cyanocobalamin 100 MCG tablet Take 100 mcg by mouth daily. 04/03/23   Kopel, Andrew L, PA  Dextromethorphan-guaiFENesin  5-50 MG/5ML SYRP Take 10 mLs by mouth every 4 (four) hours as needed.    [provider]  esomeprazole  (NEXIUM ) 40 MG capsule Take 1 capsule (40 mg total) by mouth daily before breakfast. 11/03/23   Ezzard Sonny RAMAN, PA-C  famotidine  (PEPCID ) 10 MG tablet Take 10 mg by mouth at bedtime. 06/12/22   [provider]  ferrous sulfate  325 (65 FE) MG EC tablet Take 325 mg by mouth daily. 04/03/23   Kopel, Andrew L, PA  Fluticasone -Umeclidin-Vilant 100-62.5-25 MCG/ACT AEPB Inhale 1 puff into the lungs at bedtime.    [provider]  furosemide  (LASIX ) 20 MG tablet Take 1 tablet (20 mg total) by mouth as needed for edema (swelling). 01/10/21   Alvan Dorn FALCON, MD  gabapentin  (NEURONTIN ) 300 MG capsule Take 300 mg by mouth at bedtime. 1 capsule nightly for neuropathy 04/03/23   Kopel, Andrew L, PA  guaifenesin  (ROBITUSSIN) 100 MG/5ML syrup Take 100 mg by mouth 3 (three) times daily as needed for cough.    [provider]  hydrALAZINE  (APRESOLINE ) 25 MG tablet Take 25 mg by mouth 3 (three) times daily. 04/03/23   Kopel, Andrew L, PA  hydrochlorothiazide  (MICROZIDE ) 12.5 MG capsule Take 12.5 mg by mouth daily.    [provider]  HYDROcodone -acetaminophen  (NORCO/VICODIN) 5-325 MG tablet Take 1 tablet by mouth every 4 (four) hours as needed. for pain    [provider]  levothyroxine (SYNTHROID) 25 MCG tablet Take 25 mcg by mouth daily before breakfast.    [provider]   levothyroxine (SYNTHROID) 75 MCG tablet Take 75 mcg by mouth daily. 11/12/23   [provider]  Magnesium  Oxide 400 MG CAPS Take 1 capsule (400 mg total) by mouth daily. 07/09/21   Lesia Ozell Barter, PA-C  metoprolol  tartrate (LOPRESSOR ) 25 MG tablet Take 12.5 mg by mouth 2 (two) times daily. 03/25/23   [provider]  mirtazapine (REMERON) 15 MG tablet Take 15 mg by mouth at bedtime.    [provider]  ondansetron  (ZOFRAN ) 4 MG tablet Take 4 mg by mouth every 8 (eight) hours as needed for nausea or vomiting. Take 1-2 tablets (4-8 mg) by mouth every eight hours as needed for up to 16 doses 04/03/23   Kopel, Andrew L, PA  OXYGEN  Inhale 2 L into the lungs continuous.    [provider]  polyethylene glycol (MIRALAX / GLYCOLAX) 17 g packet Take 17 g by mouth at bedtime.    [provider]  sennosides-docusate sodium (SENOKOT-S) 8.6-50 MG tablet Take 1 tablet by mouth at bedtime. 04/03/23   Kopel, Andrew L, PA  sertraline (ZOLOFT) 25 MG tablet Take 25 mg by mouth daily. 10/31/23   [provider]  thiamine (VITAMIN B-1) 100 MG tablet Take 100 mg by mouth daily. 04/03/23   Kopel, Andrew L, PA  traMADol  (ULTRAM ) 50 MG tablet TAKE 1 TABLET (50 MG TOTAL) BY MOUTH EVERY 12 (TWELVE) HOURS AS NEEDED. 09/02/23   Onesimo Oneil LABOR, MD  triamcinolone  cream (KENALOG ) 0.5 % Apply 1 Application topically in the morning and at bedtime. 02/05/22   [provider]  valsartan (DIOVAN) 160 MG tablet Take 160 mg by mouth daily. 04/22/23 11/14/23  [provider]  venlafaxine  (EFFEXOR ) 75 MG tablet Take 225 mg by mouth daily. Patient not taking: Reported on 11/14/2023    [provider]  venlafaxine  (EFFEXOR ) 75 MG tablet Take 75 mg by mouth 2 (two) times daily.    [provider]    Physical Exam:  Vitals:   12/21/23 0030 12/21/23 0045 12/21/23 0100 12/21/23 0121  BP: (!) 126/41 (!) 136/47 (!) 133/42   Pulse: 76 76 75   Resp: (!) 23  20 15    Temp:    97.8 F (36.6 C)  TempSrc:    Oral  SpO2: 98% 97% 97%   Weight:  Height:        Constitutional: Awake alert and oriented x3, very mild respiratory distress. Skin: no rashes, no lesions, good skin turgor noted. Eyes: Pupils are equally reactive to light.  No evidence of scleral icterus or conjunctival pallor.  ENMT: Minimal intermittent inspiratory wheezing, possibly stridor heard over the upper airway.  Moist mucous membranes noted.  Posterior pharynx clear of any exudate or lesions.   Neck: normal, supple, no masses, no thyromegaly.  No evidence of jugular venous distension.   Respiratory:  minimal intermittent inspiratory wheezing heard.  Normal respiratory effort. No accessory muscle use.  Cardiovascular: Regular rate and rhythm, no murmurs / rubs / gallops. No extremity edema. 2+ pedal pulses. No carotid bruits.  Chest:   Nontender without crepitus or deformity.   Back:   Nontender without crepitus or deformity. Abdomen: Abdomen is soft and nontender.  No evidence of intra-abdominal masses.  Positive bowel sounds noted in all quadrants.   Musculoskeletal: No joint deformity upper and lower extremities. Good ROM, no contractures. Normal muscle tone.  Neurologic: CN 2-12 grossly intact. Sensation intact.  Patient moving all 4 extremities spontaneously.  Patient is following all commands.  Patient is responsive to verbal stimuli.   Psychiatric: Patient exhibits normal mood with appropriate affect.  Patient seems to possess insight as to their current situation.    Data Reviewed:  I have personally reviewed and interpreted labs, imaging.  Significant findings are   Lab Results  Component Value Date   WBC 6.6 12/20/2023   HGB 10.1 (L) 12/20/2023   HCT 31.3 (L) 12/20/2023   MCV 100.0 12/20/2023   PLT 277 12/20/2023   Lab Results  Component Value Date   K 4.2 12/20/2023   Lab Results  Component Value Date   BUN 29 (H) 12/20/2023   Lab Results  Component  Value Date   CREATININE 1.42 (H) 12/20/2023    CT Chest: Multiple bilateral pulmonary nodules noted, some somewhat spiculated in nature.  EKG: Personally reviewed.  Rhythm is normal sinus rhythm with heart rate of 82 bpm.  Evidence of right bundle branch block no dynamic ST segment changes appreciated.   Assessment & Plan Stridor Patient presenting with nearly 2 weeks of increasing shortness of breath and wheezing Upon initial evaluation by the ED care team there was clinical concern for stridor. Patient has since received bronchodilator therapy and systemic steroids with substantial improvement in symptoms EDPs additionally discussed case with pulmonology who recommended transferring the patient to [facility] for formal consultation and possible ENT evaluation after their consultation. CT imaging of the head and neck soft tissues have been ordered and performed at the recommendation of pulmonology which revealed no obvious abnormality or mass. Will continue to monitor and provide as needed bronchodilator therapy and racemic epinephrine Will continue systemic steroids for now although patient has clinically dramatically improved Etiology unclear AKI (acute kidney injury) Increasing creatinine compared to baseline, possibly due to poor oral intake in the past several days/weeks in the setting of acute illness Obtaining urinalysis Placing patient on gentle intravenous hydration Monitoring renal function with serial chemistries Multiple lung nodules on CT Numerous lung nodules bilaterally noted on CT imaging of the lungs performed earlier in the day on 10/11 Patient has known history of breast cancer Furthermore several of the nodules appear spiculated  Patient will likely need further malignancy workup.  Will inquire with pulmonology upon their formal consultation as to the best approach. Persistent atrial fibrillation (HCC) Continue amiodarone  Continue metoprolol  Monitoring  on  telemetry Continue anticoagulation with Eliquis  COPD (chronic obstructive pulmonary disease) (HCC) Patient does not appear to be in concurrent COPD exacerbation Medical management as above Continue Breztri  GERD without esophagitis Continuing home regimen of daily PPI therapy.  Mixed hyperlipidemia Continuing home regimen of lipid lowering therapy.  Hypothyroidism Resume home regimen of Synthroid     Code Status:  DNR  code status decision has been confirmed with: Goals of care discussed in detail with patient at the bedside.  Patient reports that she wishes to be DNR and DNI. Family Communication: none   Consults: P discussed case with Dr. Maree with pulmonology who will evaluate patient in consultation morning of 10/12 after arrival to [facility].  Severity of Illness:  The appropriate patient status for this patient is OBSERVATION. Observation status is judged to be reasonable and necessary in order to provide the required intensity of service to ensure the patient's safety. The patient's presenting symptoms, physical exam findings, and initial radiographic and laboratory data in the context of their medical condition is felt to place them at decreased risk for further clinical deterioration. Furthermore, it is anticipated that the patient will be medically stable for discharge from the hospital within 2 midnights of admission.   Author:  Zachary JINNY Ba MD  12/21/2023 2:04 AM

## 2023-12-21 NOTE — Assessment & Plan Note (Signed)
.   Continuing home regimen of lipid lowering therapy.  

## 2023-12-21 NOTE — ED Notes (Signed)
 Niece Dickey Bihari [phone] would like an update asap

## 2023-12-21 NOTE — Assessment & Plan Note (Signed)
 Continue amiodarone  Continue metoprolol  Monitoring on telemetry Continue anticoagulation with Eliquis 

## 2023-12-21 NOTE — Assessment & Plan Note (Signed)
 Increasing creatinine compared to baseline, possibly due to poor oral intake in the past several days/weeks in the setting of acute illness Obtaining urinalysis Placing patient on gentle intravenous hydration Monitoring renal function with serial chemistries

## 2023-12-22 ENCOUNTER — Observation Stay (HOSPITAL_COMMUNITY)

## 2023-12-22 DIAGNOSIS — J42 Unspecified chronic bronchitis: Secondary | ICD-10-CM | POA: Diagnosis not present

## 2023-12-22 DIAGNOSIS — R061 Stridor: Secondary | ICD-10-CM | POA: Diagnosis not present

## 2023-12-22 DIAGNOSIS — K219 Gastro-esophageal reflux disease without esophagitis: Secondary | ICD-10-CM | POA: Diagnosis not present

## 2023-12-22 DIAGNOSIS — N179 Acute kidney failure, unspecified: Secondary | ICD-10-CM | POA: Diagnosis not present

## 2023-12-22 LAB — BASIC METABOLIC PANEL WITH GFR
Anion gap: 9 (ref 5–15)
BUN: 26 mg/dL — ABNORMAL HIGH (ref 8–23)
CO2: 26 mmol/L (ref 22–32)
Calcium: 8.9 mg/dL (ref 8.9–10.3)
Chloride: 102 mmol/L (ref 98–111)
Creatinine, Ser: 1.08 mg/dL — ABNORMAL HIGH (ref 0.44–1.00)
GFR, Estimated: 49 mL/min — ABNORMAL LOW (ref >60)
Glucose, Bld: 121 mg/dL — ABNORMAL HIGH (ref 70–99)
Potassium: 3.9 mmol/L (ref 3.5–5.1)
Sodium: 137 mmol/L (ref 135–145)

## 2023-12-22 LAB — BRAIN NATRIURETIC PEPTIDE: B Natriuretic Peptide: 401.2 pg/mL — ABNORMAL HIGH (ref 0.0–100.0)

## 2023-12-22 LAB — PROCALCITONIN: Procalcitonin: <0.1 ng/mL

## 2023-12-22 LAB — URINE CULTURE: Culture: >=100000 — AB

## 2023-12-22 LAB — CBC
HCT: 26.7 % — ABNORMAL LOW (ref 36.0–46.0)
Hemoglobin: 8.6 g/dL — ABNORMAL LOW (ref 12.0–15.0)
MCH: 31.5 pg (ref 26.0–34.0)
MCHC: 32.2 g/dL (ref 30.0–36.0)
MCV: 97.8 fL (ref 80.0–100.0)
Platelets: 215 K/uL (ref 150–400)
RBC: 2.73 MIL/uL — ABNORMAL LOW (ref 3.87–5.11)
RDW: 14.6 % (ref 11.5–15.5)
WBC: 5 K/uL (ref 4.0–10.5)
nRBC: 0 % (ref 0.0–0.2)

## 2023-12-22 LAB — MAGNESIUM: Magnesium: 2.4 mg/dL (ref 1.7–2.4)

## 2023-12-22 LAB — PHOSPHORUS: Phosphorus: 3.6 mg/dL (ref 2.5–4.6)

## 2023-12-22 MED ORDER — PREDNISONE 20 MG PO TABS
40.0000 mg | ORAL_TABLET | Freq: Every day | ORAL | Status: DC
  Administered 2023-12-23: 40 mg via ORAL
  Filled 2023-12-22 (×2): qty 2

## 2023-12-22 MED ORDER — HYDRALAZINE HCL 50 MG PO TABS
50.0000 mg | ORAL_TABLET | Freq: Three times a day (TID) | ORAL | Status: DC
  Administered 2023-12-22 – 2023-12-23 (×4): 50 mg via ORAL
  Filled 2023-12-22 (×4): qty 1

## 2023-12-22 NOTE — Evaluation (Signed)
 Physical Therapy Evaluation Patient Details Name: Amanda Palmer MRN: [id number] DOB: March 30, 1933 Today's Date: 12/22/2023  History of Present Illness  89 year old female who presented to [facility] ED with complaints of SOB and wheezing. CT revealed pulmonary nodules, possible metistatic disease, transfer to [facility] for ENT consult. PMH: breast cancer in 2013, COPD, hypertension, hyperlipidemia, gastroesophageal reflux disease, persistent atrial fibrillation, diastolic congestive heart failure (Echo 03/2022 EF 60-65% G2DD), anxiety/depression   Clinical Impression  Pt admitted with above. PTA pt resided in [facility] in [place], used a rollator for ambulation and received some help for ADLs. Pt current c/o SOB and difficulty breathing with mobility and talking in addition to having a hard time swallowing. Pt functioning with minA at this time and would benefit from follow up PT at [facility] upon d/c to return to prior level of function. Acute PT to cont to follow.        If plan is discharge home, recommend the following: A little help with walking and/or transfers;A little help with bathing/dressing/bathroom;Assist for transportation   Can travel by private vehicle        Equipment Recommendations None recommended by PT  Recommendations for Other Services       Functional Status Assessment Patient has had a recent decline in their functional status and demonstrates the ability to make significant improvements in function in a reasonable and predictable amount of time.     Precautions / Restrictions Precautions Precautions: Fall Restrictions Weight Bearing Restrictions Per Provider Order: No      Mobility  Bed Mobility               General bed mobility comments: pt received sitting up in chair    Transfers Overall transfer level: Needs assistance Equipment used: Rollator (4 wheels) Transfers: Sit to/from Stand Sit to Stand: Min assist           General transfer comment:  MinA  to power up, verbal cues to push up from arm rests not rollator    Ambulation/Gait Ambulation/Gait assistance: Min assist Gait Distance (Feet): 100 Feet Assistive device: Rollator (4 wheels) Gait Pattern/deviations: Step-through pattern, Decreased stride length Gait velocity: dec Gait velocity interpretation: <1.31 ft/sec, indicative of household ambulator   General Gait Details: assist for rollator navigation around obstacles, noted SOB, SpO2 90% on 3LO2 via [address], pt reports I think I'm breathing better.  Stairs            Wheelchair Mobility     Tilt Bed    Modified Rankin (Stroke Patients Only)       Balance Overall balance assessment: Needs assistance Sitting-balance support: No upper extremity supported, Feet supported Sitting balance-Leahy Scale: Fair     Standing balance support: Bilateral upper extremity supported, During functional activity Standing balance-Leahy Scale: Poor Standing balance comment: reliant on rollator                             Pertinent Vitals/Pain Pain Assessment Pain Assessment: No/denies pain    Home Living Family/patient expects to be discharged to:: Assisted living [facility])                 Home Equipment: Rollator (4 wheels);Shower seat Additional Comments: lives at [facility]    Prior Function Prior Level of Function : Needs assist             Mobility Comments: use of rollator for mobility ADLs Comments: supervision for showering and assist with  shower transfers but able to bathe self. typically able to dress and toilet self     Extremity/Trunk Assessment   Upper Extremity Assessment Upper Extremity Assessment: Overall WFL for tasks assessed    Lower Extremity Assessment Lower Extremity Assessment: Generalized weakness    Cervical / Trunk Assessment Cervical / Trunk Assessment: Normal  Communication   Communication Communication: Impaired Factors Affecting Communication:  Hearing impaired    Cognition Arousal: Alert Behavior During Therapy: WFL for tasks assessed/performed   PT - Cognitive impairments: No apparent impairments                         Following commands: Intact       Cueing Cueing Techniques: Verbal cues, Gestural cues     General Comments General comments (skin integrity, edema, etc.): pt reports edema in abdomen. SpO2 94% on RA while talking, SpO2 100% on 3LO2 via [place] and less dyspnic during talking    Exercises     Assessment/Plan    PT Assessment Patient needs continued PT services  PT Problem List Cardiopulmonary status limiting activity;Decreased activity tolerance;Decreased balance;Decreased mobility       PT Treatment Interventions DME instruction;Gait training;Functional mobility training;Therapeutic exercise;Therapeutic activities;Balance training;Neuromuscular re-education    PT Goals (Current goals can be found in the Care Plan section)  Acute Rehab PT Goals Patient Stated Goal: improve breathing PT Goal Formulation: With patient Time For Goal Achievement: 01/05/24 Potential to Achieve Goals: Good    Frequency Min 3X/week     Co-evaluation               AM-PAC PT 6 Clicks Mobility  Outcome Measure Help needed turning from your back to your side while in a flat bed without using bedrails?: A Little Help needed moving from lying on your back to sitting on the side of a flat bed without using bedrails?: A Little Help needed moving to and from a bed to a chair (including a wheelchair)?: A Little Help needed standing up from a chair using your arms (e.g., wheelchair or bedside chair)?: A Little Help needed to walk in hospital room?: A Little Help needed climbing 3-5 steps with a railing? : A Little 6 Click Score: 18    End of Session Equipment Utilized During Treatment: Gait belt Activity Tolerance: Patient tolerated treatment well Patient left: in bed;with call bell/phone within reach;with  chair alarm set Nurse Communication: Mobility status (Spo2) PT Visit Diagnosis: Unsteadiness on feet (R26.81);Muscle weakness (generalized) (M62.81);Difficulty in walking, not elsewhere classified (R26.2)    Time: 9146-9076 PT Time Calculation (min) (ACUTE ONLY): 30 min   Charges:   PT Evaluation $PT Eval Low Complexity: 1 Low PT Treatments $Gait Training: 8-22 mins PT General Charges $$ ACUTE PT VISIT: 1 Visit         Norene Ames, PT, DPT Acute Rehabilitation Services Secure chat preferred Office #: [phone]   Norene CHRISTELLA Ames 12/22/2023, 10:29 AM

## 2023-12-22 NOTE — Plan of Care (Signed)

## 2023-12-22 NOTE — Progress Notes (Signed)
 NAME:  Amanda Palmer, MRN:  [id number], DOB:  08-25-1933, LOS: 0 ADMISSION DATE:  12/20/2023, CONSULTATION DATE:  12/21/2023 REFERRING MD:  TRH, CHIEF COMPLAINT: Acute hypoxic respiratory failure  History of Present Illness:  Amanda Palmer is a 89 year old female with a past medical history significant for COPD felt secondary to large secondhand smoking exposure, atrial fibrillation, GERD, HTN, nonischemic cardiomyopathy, and prior TIA who presented to the ED at [facility] for complaints of shortness of breath with associated potential stridor or wheezing.  Patient was admitted per hospitalist for management of shortness of breath/stridor and AKI.  Pulmonary consulted for assistance in management.  Of note CT chest was completed and revealed numerous lung nodules bilaterally concerning for potential underlying malignancy.   Pertinent  Medical History  COPD felt secondary to large secondhand smoking exposure, atrial fibrillation, GERD, HTN, nonischemic cardiomyopathy, and prior TIA  Significant Hospital Events: Including procedures, antibiotic start and stop dates in addition to other pertinent events   10/12 presented for shortness of breath with history of COPD, abnormal CT  Interim History / Subjective:   Feels well.  Just finished speech eval but no evidence of aspiration  Objective    Blood pressure (!) 135/44, pulse 67, temperature 97.9 F (36.6 C), temperature source Oral, resp. rate 15, height 5' (1.524 m), weight 65.2 kg, SpO2 100%.        Intake/Output Summary (Last 24 hours) at 12/22/2023 1343 Last data filed at 12/21/2023 1745 Gross per 24 hour  Intake 100 ml  Output --  Net 100 ml   Filed Weights   12/20/23 2107  Weight: 65.2 kg    Examination: Gen:      No acute distress HEENT:  EOMI, sclera anicteric Neck:     No masses; no thyromegaly Lungs:    Clear to auscultation bilaterally; normal respiratory effort CV:         Regular rate and rhythm; no  murmurs Abd:      + bowel sounds; soft, non-tender; no palpable masses, no distension Ext:    No edema; adequate peripheral perfusion Neuro: alert and oriented x 3 Psych: normal mood and affect   Labs/Imaging personally reviewed, significant for CT scan 12/20/2023-new multiple bilateral lung nodules with largest spiculated lesion in the left upper lobe measuring 17 mm.  Resolved problem list   Assessment and Plan  COPD exacerbation with community-acquired pneumonia COPD exacerbation with community-acquired pneumonia, presenting with persistent cough, wheezing, and shortness of breath for two weeks. Increased mucus production, with sputum described as shallow and white/yellow. No smoking history, but significant secondhand smoke exposure. Recent CT scan shows new lung nodules. Symptoms improved with steroids and nebulizers, indicating a likely infectious and inflammatory component. - Continue treatment for community-acquired pneumonia - Switch to prednisone  40 mg/day with 7-day taper - Discussed with with pulmonologist, Dr. Shellia who will arrange follow-up CT scan in 2-4 weeks to assess improvement.   Pulmonary nodule under surveillance Pulmonary nodule identified on recent CT scan. Previous breast cancer in 1998, with no recurrence, reducing suspicion for metastatic disease. Differential diagnosis includes pneumonia versus malignancy, but current suspicion for cancer is lower. - Follow-up CT as outpatient  PCCM will be available as needed.  Please call with questions.  Signature:   Murna Backer MD [facility] See Amion for pager  If no response to pager , please call [phone] until 7pm After 7:00 pm call Elink  [phone] 12/22/2023, 1:45 PM

## 2023-12-22 NOTE — Evaluation (Signed)
 Occupational Therapy Evaluation Patient Details Name: Amanda Palmer MRN: [id number] DOB: 09-19-1933 Today's Date: 12/22/2023   History of Present Illness   89 year old female who presented to [facility] ED with complaints of SOB and wheezing. CT revealed pulmonary nodules, possible metistatic disease, transfer to [facility] for ENT consult. PMH: breast cancer in 2013, COPD, hypertension, hyperlipidemia, gastroesophageal reflux disease, persistent atrial fibrillation, diastolic congestive heart failure (Echo 03/2022 EF 60-65% G2DD), anxiety/depression     Clinical Impressions PTA, pt lives at [facility], ambulatory with a rollator and overall Modified Independent with ADLs aside from showering tasks. Pt presents now with deficits in strength and cardiopulmonary endurance. Pt requires Min A to stand but once up, able to mobilize with CGA using RW. Pt requires Setup for UB ADL, Min A for LB ADLs w/ limitations d/t 3/4 DOE though SpO2 > 92% on RA. Recommend HHOT at ALF upon DC to maximize ADL independence and further implement energy conservation education.      If plan is discharge home, recommend the following:   A little help with walking and/or transfers;A little help with bathing/dressing/bathroom     Functional Status Assessment   Patient has had a recent decline in their functional status and demonstrates the ability to make significant improvements in function in a reasonable and predictable amount of time.     Equipment Recommendations   None recommended by OT     Recommendations for Other Services         Precautions/Restrictions   Precautions Precautions: Fall Restrictions Weight Bearing Restrictions Per Provider Order: No     Mobility Bed Mobility Overal bed mobility: Needs Assistance Bed Mobility: Supine to Sit     Supine to sit: Min assist, HOB elevated     General bed mobility comments: Min A to scoot hips fully to EOB using bed pad    Transfers Overall  transfer level: Needs assistance Equipment used: Rolling walker (2 wheels) Transfers: Sit to/from Stand Sit to Stand: Min assist           General transfer comment: MinA  from bedside and regular height toilet      Balance Overall balance assessment: Needs assistance Sitting-balance support: No upper extremity supported, Feet supported Sitting balance-Leahy Scale: Fair     Standing balance support: Bilateral upper extremity supported, During functional activity Standing balance-Leahy Scale: Poor                             ADL either performed or assessed with clinical judgement   ADL Overall ADL's : Needs assistance/impaired Eating/Feeding: Independent Eating/Feeding Details (indicate cue type and reason): likely no issues but NPO at time of eval Grooming: Set up;Sitting;Wash/dry face;Wash/dry hands   Upper Body Bathing: Set up;Sitting   Lower Body Bathing: Minimal assistance;Sitting/lateral leans;Sit to/from stand Lower Body Bathing Details (indicate cue type and reason): assist for posterior hygiene after BM smear. able to bathe most of LE seated on toilet without issues Upper Body Dressing : Set up;Sitting   Lower Body Dressing: Minimal assistance;Sitting/lateral leans;Sit to/from stand   Toilet Transfer: Minimal assistance;Contact guard assist;Ambulation;Rolling walker (2 wheels) Toilet Transfer Details (indicate cue type and reason): Min A to stand from toilet, CGA for bathroom mobility Toileting- Clothing Manipulation and Hygiene: Minimal assistance;Sitting/lateral lean;Sit to/from stand       Functional mobility during ADLs: Contact guard assist;Rolling walker (2 wheels) General ADL Comments: limited by 3/4 DOE during ADLs though SpO2 WFL. Cues for breathing  techniques, discussed initial enery conservation strategies     Vision Ability to See in Adequate Light: 0 Adequate Patient Visual Report: No change from baseline Vision Assessment?: No  apparent visual deficits     Perception         Praxis         Pertinent Vitals/Pain Pain Assessment Pain Assessment: No/denies pain     Extremity/Trunk Assessment Upper Extremity Assessment Upper Extremity Assessment: Overall WFL for tasks assessed   Lower Extremity Assessment Lower Extremity Assessment: Generalized weakness   Cervical / Trunk Assessment Cervical / Trunk Assessment: Normal   Communication Communication Communication: Impaired Factors Affecting Communication: Hearing impaired   Cognition Arousal: Alert Behavior During Therapy: WFL for tasks assessed/performed Cognition: No apparent impairments                               Following commands: Intact       Cueing  General Comments   Cueing Techniques: Verbal cues;Gestural cues  pt reports edema in abdomen. SpO2 94% on RA while talking, SpO2 100% on 3LO2 via [place] and less dyspnic during talking   Exercises     Shoulder Instructions      Home Living Family/patient expects to be discharged to:: Assisted living [facility])                             Home Equipment: Rollator (4 wheels);Shower seat   Additional Comments: lives at [facility]      Prior Functioning/Environment Prior Level of Function : Needs assist             Mobility Comments: use of rollator for mobility ADLs Comments: supervision for showering and assist with shower transfers but able to bathe self. typically able to dress and toilet self    OT Problem List: Decreased strength;Decreased activity tolerance;Impaired balance (sitting and/or standing)   OT Treatment/Interventions: Therapeutic exercise;Self-care/ADL training;Energy conservation;DME and/or AE instruction;Therapeutic activities;Patient/family education;Balance training      OT Goals(Current goals can be found in the care plan section)   Acute Rehab OT Goals Patient Stated Goal: have swallowing test done, have some food OT  Goal Formulation: With patient Time For Goal Achievement: 01/05/24 Potential to Achieve Goals: Good ADL Goals Pt Will Perform Lower Body Dressing: sit to/from stand;with set-up Pt Will Transfer to Toilet: with set-up;ambulating Pt/caregiver will Perform Home Exercise Program: Increased strength;Both right and left upper extremity;With theraband;Independently;With written HEP provided Additional ADL Goal #1: Pt to implement at least 3 energy conservation strategies during ADLs/mobility   OT Frequency:  Min 2X/week    Co-evaluation              AM-PAC OT 6 Clicks Daily Activity     Outcome Measure Help from another person eating meals?: None Help from another person taking care of personal grooming?: A Little Help from another person toileting, which includes using toliet, bedpan, or urinal?: A Little Help from another person bathing (including washing, rinsing, drying)?: A Little Help from another person to put on and taking off regular upper body clothing?: A Little Help from another person to put on and taking off regular lower body clothing?: A Little 6 Click Score: 19   End of Session Equipment Utilized During Treatment: Gait belt;Rolling walker (2 wheels)  Activity Tolerance: Patient tolerated treatment well Patient left: in chair;with call bell/phone within reach;Other (comment) (with PT at bedside)  OT Visit Diagnosis: Unsteadiness on feet (R26.81);Other abnormalities of gait and mobility (R26.89);Muscle weakness (generalized) (M62.81)                Time: 9181-9143 OT Time Calculation (min): 38 min Charges:  OT General Charges $OT Visit: 1 Visit OT Evaluation $OT Eval Moderate Complexity: 1 Mod OT Treatments $Self Care/Home Management : 8-22 mins  Mliss NOVAK, OTR/L Acute Rehab Services Office: [phone]   Mliss Fish 12/22/2023, 10:32 AM
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
## 2023-12-22 NOTE — Progress Notes (Addendum)
 PROGRESS NOTE        PATIENT DETAILS Name: Amanda Palmer Age: 89 y.o. Sex: female Date of Birth: 1933-06-18 Admit Date: 12/20/2023 Admitting Physician Zachary JINNY Ba, MD ERE:Yjoo, Norleen PEDLAR, MD  Brief Summary: Patient is a 89 y.o.  female with history of breast cancer 2013, HTN, HLD, chronic A-fib, HFpEF, COPD on nocturnal O2 2-3 L-presented to [facility] for shortness of breath-due to concern for possible stridor-she was transferred to [facility] for further evaluation.  Her stridor rapidly improved with steroids and other supportive measures-it was felt that she likely had COPD exacerbation.  Significant events: 10/12>> admit to [facility].  Significant studies: 10/11>> CT chest: Multiple bilateral pulmonary nodules. 10/11>> CT soft tissue neck: No acute findings/mass.  Significant microbiology data: 10/12>> COVID/influenza/RSV PCR: Negative 10/12>> respiratory virus panel: Negative  Procedures: None  Consults: PCCM  Subjective: Feels much better-has not ambulated yet-but no wheezing or stridor during my evaluation.  No nausea or vomiting.  Objective: Vitals: Blood pressure (!) 167/63, pulse 71, temperature 97.7 F (36.5 C), temperature source Oral, resp. rate 14, height 5' (1.524 m), weight 65.2 kg, SpO2 99%.   Exam: Gen Exam:Alert awake-not in any distress HEENT:atraumatic, normocephalic Chest: B/L clear to auscultation anteriorly CVS:S1S2 regular Abdomen:soft non tender, non distended Extremities:no edema Neurology: Non focal Skin: no rash  Pertinent Labs/Radiology:    Latest Ref Rng & Units 12/22/2023    3:16 AM 12/21/2023    5:43 AM 12/20/2023    9:16 PM  CBC  WBC 4.0 - 10.5 K/uL 5.0  3.7  6.6   Hemoglobin 12.0 - 15.0 g/dL 8.6  8.9  89.8   Hematocrit 36.0 - 46.0 % 26.7  28.0  31.3   Platelets 150 - 400 K/uL 215  195  277     Lab Results  Component Value Date   NA 137 12/22/2023   K 3.9 12/22/2023   CL 102 12/22/2023   CO2 26 12/22/2023       Assessment/Plan: Stridor Resolved No obvious etiology found In retrospect-this could have been some mild upper airway syndrome in the setting of COPD exacerbation  COPD exacerbation Improved Moving air well-hardly any wheezing Switch Solu-Medrol  to prednisone  Continue bronchodilators Continue Zithromax x 3 days-but do not think she requires Rocephin. Mobilize with PT/OT.  Chronic hypoxic respiratory failure On home O2-mostly nocturnally-stable on 2-3 L this morning.  Pulmonary Nodules Hx of breast cancer in the remote past-PCCM arranging outpatient follow up and repeat imaging/workup  AKI Hemodynamically mediated Resolved Continue to hold HCTZ  ?  Dysphagia Overnight-some concern by nursing staff for dysphagia Await SLP eval.  Chronic atrial fibrillation Rate controlled Amiodarone /metoprolol  Eliquis .  HTN BP on the higher side Increase hydralazine  to 50 mg 3 times daily Continue metoprolol  HCTZ on hold  HLD Statin  GERD PPI  Hypothyroidism Synthroid  Anxiety/depression Stable Zoloft/Remeron  Hard of hearing   Code status:   Code Status: Do not attempt resuscitation (DNR) PRE-ARREST INTERVENTIONS DESIRED   DVT Prophylaxis: apixaban  (ELIQUIS ) tablet 2.5 mg Start: 12/21/23 0145 SCDs Start: 12/21/23 0132 apixaban  (ELIQUIS ) tablet 2.5 mg     Family Communication: None at bedside   Disposition Plan: Status is: Observation The patient will require care spanning > 2 midnights and should be moved to inpatient because: Severity of illness   Planned Discharge Destination:Assisted living   Diet: Diet Order  Diet NPO time specified Except for: Ice Chips, Sips with Meds  Diet effective now                     Antimicrobial agents: Anti-infectives (From admission, onward)    Start     Dose/Rate Route Frequency Ordered Stop   12/22/23 1000  azithromycin (ZITHROMAX) tablet 250 mg  Status:  Discontinued       Placed in  Followed by Linked Group   250 mg Oral Daily 12/21/23 1413 12/21/23 1413   12/21/23 1415  cefTRIAXone (ROCEPHIN) 2 g in sodium chloride  0.9 % 100 mL IVPB        2 g 200 mL/hr over 30 Minutes Intravenous Every 24 hours 12/21/23 1404     12/21/23 1415  azithromycin (ZITHROMAX) tablet 500 mg  Status:  Discontinued       Placed in Followed by Linked Group   500 mg Oral Daily 12/21/23 1413 12/21/23 1413   12/21/23 1415  azithromycin (ZITHROMAX) tablet 500 mg        500 mg Oral Daily 12/21/23 1413 12/24/23 0959   12/21/23 1000  doxycycline  (VIBRA -TABS) tablet 100 mg  Status:  Discontinued        100 mg Oral Every 12 hours 12/21/23 0828 12/21/23 1358        MEDICATIONS: Scheduled Meds:  amiodarone   200 mg Oral Daily   apixaban   2.5 mg Oral BID   atorvastatin   20 mg Oral QHS   azithromycin  500 mg Oral Daily   budesonide -glycopyrrolate -formoterol  2 puff Inhalation BID   famotidine   10 mg Oral QHS   hydrALAZINE   25 mg Oral TID   levothyroxine  75 mcg Oral Q0600   methylPREDNISolone  (SOLU-MEDROL ) injection  40 mg Intravenous Daily   metoprolol  tartrate  12.5 mg Oral BID   mirtazapine  15 mg Oral QHS   pantoprazole   40 mg Oral Daily   polyethylene glycol  17 g Oral QHS   senna-docusate  1 tablet Oral QHS   sertraline  25 mg Oral Daily   Continuous Infusions:  cefTRIAXone (ROCEPHIN)  IV Stopped (12/21/23 1745)   PRN Meds:.acetaminophen  **OR** acetaminophen , glucagon (human recombinant), hydrALAZINE , ipratropium-albuterol , metoprolol  tartrate, Racepinephrine HCl   I have personally reviewed following labs and imaging studies  LABORATORY DATA: CBC: Recent Labs  Lab 12/20/23 2116 12/21/23 0543 12/22/23 0316  WBC 6.6 3.7* 5.0  NEUTROABS 4.6 3.4  --   HGB 10.1* 8.9* 8.6*  HCT 31.3* 28.0* 26.7*  MCV 100.0 99.6 97.8  PLT 277 195 215    Basic Metabolic Panel: Recent Labs  Lab 12/20/23 2116 12/21/23 0543 12/22/23 0316  NA 141 135 137  K 4.2 4.5 3.9  CL 100 101 102   CO2 28 24 26   GLUCOSE 138* 152* 121*  BUN 29* 25* 26*  CREATININE 1.42* 1.21* 1.08*  CALCIUM  9.6 9.0 8.9  MG  --  2.2 2.4  PHOS  --   --  3.6    GFR: Estimated Creatinine Clearance: 30.4 mL/min (A) (by C-G formula based on SCr of 1.08 mg/dL (H)).  Liver Function Tests: Recent Labs  Lab 12/20/23 2116 12/21/23 0543  AST 25 22  ALT 19 17  ALKPHOS 94 67  BILITOT 0.3 0.5  PROT 7.1 6.3*  ALBUMIN 3.9 2.9*   No results for input(s): LIPASE, AMYLASE in the last 168 hours. No results for input(s): AMMONIA in the last 168 hours.  Coagulation Profile: No results for input(s): INR, PROTIME  in the last 168 hours.  Cardiac Enzymes: No results for input(s): CKTOTAL, CKMB, CKMBINDEX, TROPONINI in the last 168 hours.  BNP (last 3 results) No results for input(s): PROBNP in the last 8760 hours.  Lipid Profile: No results for input(s): CHOL, HDL, LDLCALC, TRIG, CHOLHDL, LDLDIRECT in the last 72 hours.  Thyroid  Function Tests: No results for input(s): TSH, T4TOTAL, FREET4, T3FREE, THYROIDAB in the last 72 hours.  Anemia Panel: No results for input(s): VITAMINB12, FOLATE, FERRITIN, TIBC, IRON, RETICCTPCT in the last 72 hours.  Urine analysis:    Component Value Date/Time   COLORURINE YELLOW 12/21/2023 0223   APPEARANCEUR HAZY (A) 12/21/2023 0223   LABSPEC 1.029 12/21/2023 0223   PHURINE 6.0 12/21/2023 0223   GLUCOSEU NEGATIVE 12/21/2023 0223   HGBUR NEGATIVE 12/21/2023 0223   BILIRUBINUR NEGATIVE 12/21/2023 0223   KETONESUR NEGATIVE 12/21/2023 0223   PROTEINUR NEGATIVE 12/21/2023 0223   UROBILINOGEN 1.0 05/31/2014 0222   NITRITE NEGATIVE 12/21/2023 0223   LEUKOCYTESUR LARGE (A) 12/21/2023 0223    Sepsis Labs: Lactic Acid, Venous    Component Value Date/Time   LATICACIDVEN 1.0 03/24/2022 1950    MICROBIOLOGY: Recent Results (from the past 240 hours)  Resp panel by RT-PCR (RSV, Flu A&B, Covid) Anterior Nasal Swab      Status: None   Collection Time: 12/21/23 11:18 AM   Specimen: Anterior Nasal Swab  Result Value Ref Range Status   SARS Coronavirus 2 by RT PCR NEGATIVE NEGATIVE Final   Influenza A by PCR NEGATIVE NEGATIVE Final   Influenza B by PCR NEGATIVE NEGATIVE Final    Comment: (NOTE) The Xpert Xpress SARS-CoV-2/FLU/RSV plus assay is intended as an aid in the diagnosis of influenza from Nasopharyngeal swab specimens and should not be used as a sole basis for treatment. Nasal washings and aspirates are unacceptable for Xpert Xpress SARS-CoV-2/FLU/RSV testing.  Fact Sheet for Patients: BloggerCourse.com  Fact Sheet for Healthcare Providers: SeriousBroker.it  This test is not yet approved or cleared by the [place]  FDA and has been authorized for detection and/or diagnosis of SARS-CoV-2 by FDA under an Emergency Use Authorization (EUA). This EUA will remain in effect (meaning this test can be used) for the duration of the COVID-19 declaration under Section 564(b)(1) of the Act, 21 U.S.C. section 360bbb-3(b)(1), unless the authorization is terminated or revoked.     Resp Syncytial Virus by PCR NEGATIVE NEGATIVE Final    Comment: (NOTE) Fact Sheet for Patients: BloggerCourse.com  Fact Sheet for Healthcare Providers: SeriousBroker.it  This test is not yet approved or cleared by the [place]  FDA and has been authorized for detection and/or diagnosis of SARS-CoV-2 by FDA under an Emergency Use Authorization (EUA). This EUA will remain in effect (meaning this test can be used) for the duration of the COVID-19 declaration under Section 564(b)(1) of the Act, 21 U.S.C. section 360bbb-3(b)(1), unless the authorization is terminated or revoked.  Performed at [facility], [address] [place], [place] [address]   Respiratory (~20 pathogens) panel by PCR     Status: None    Collection Time: 12/21/23 11:18 AM   Specimen: Nasopharyngeal Swab; Respiratory  Result Value Ref Range Status   Adenovirus NOT DETECTED NOT DETECTED Final   Coronavirus 229E NOT DETECTED NOT DETECTED Final    Comment: (NOTE) The Coronavirus on the Respiratory Panel, DOES NOT test for the novel  Coronavirus (2019 nCoV)    Coronavirus HKU1 NOT DETECTED NOT DETECTED Final   Coronavirus NL63 NOT DETECTED NOT DETECTED  Final   Coronavirus OC43 NOT DETECTED NOT DETECTED Final   Metapneumovirus NOT DETECTED NOT DETECTED Final   Rhinovirus / Enterovirus NOT DETECTED NOT DETECTED Final   Influenza A NOT DETECTED NOT DETECTED Final   Influenza B NOT DETECTED NOT DETECTED Final   Parainfluenza Virus 1 NOT DETECTED NOT DETECTED Final   Parainfluenza Virus 2 NOT DETECTED NOT DETECTED Final   Parainfluenza Virus 3 NOT DETECTED NOT DETECTED Final   Parainfluenza Virus 4 NOT DETECTED NOT DETECTED Final   Respiratory Syncytial Virus NOT DETECTED NOT DETECTED Final   Bordetella pertussis NOT DETECTED NOT DETECTED Final   Bordetella Parapertussis NOT DETECTED NOT DETECTED Final   Chlamydophila pneumoniae NOT DETECTED NOT DETECTED Final   Mycoplasma pneumoniae NOT DETECTED NOT DETECTED Final    Comment: Performed at [facility], [address], [place], [place] [address]    RADIOLOGY STUDIES/RESULTS: CT CHEST WO CONTRAST Result Date: 12/20/2023 CLINICAL DATA:  History of breast carcinoma with shortness of breath and cough, initial encounter EXAM: CT CHEST WITHOUT CONTRAST TECHNIQUE: Multidetector CT imaging of the chest was performed following the standard protocol without IV contrast. RADIATION DOSE REDUCTION: This exam was performed according to the departmental dose-optimization program which includes automated exposure control, adjustment of the mA and/or kV according to patient size and/or use of iterative reconstruction technique. COMPARISON:  11/17/2023, CT from 12/19/2022 FINDINGS:  Cardiovascular: Limited due to lack of IV contrast. Atherosclerotic calcifications of the aorta are noted. No aneurysmal dilatation is seen. The heart is at the upper limits of normal in size. Coronary calcifications are noted. Mediastinum/Nodes: Thoracic inlet is within normal limits. Scattered small mediastinal nodes are seen the largest of these in the AP window measures 7.8 mm in short axis. These are felt to likely be reactive in nature. The esophagus as visualized is within normal limits. Lungs/Pleura: Lungs are well aerated bilaterally. Multiple nodules are noted within the right upper lobe. The largest of these measures 13 mm in greatest dimension (37/3). Second 11 mm nodule is noted along the minor fissure within the upper lobe somewhat spiculated in nature (70/3). Scattered smaller nodules are noted within the lower lobe on the right. Similar findings are noted on the left but to a lesser degree. Largest nodule area with spiculation is seen in the upper lobe on the left with a part solid component identified. This measures up to 17 mm (37/3). Medial left lower lobe nodule is seen on image number 94 series 3 stable from prior CT examination. No effusion is noted. No pneumothorax is seen. Upper Abdomen: Visualized upper abdomen is within normal limits. Musculoskeletal: Degenerative changes of the thoracic spine are seen. Old rib fractures with healing are noted bilaterally. Right breast implant is seen. IMPRESSION: Multiple bilateral pulmonary nodules as described. Given the patient's clinical history the possibility of metastatic disease deserves primary consideration. Further workup can be performed as clinically indicated. Aortic Atherosclerosis (ICD10-I70.0). Electronically Signed   By: Oneil Devonshire M.D.   On: 12/20/2023 23:50   CT Soft Tissue Neck W Contrast Result Date: 12/20/2023 EXAM: CT NECK WITH CONTRAST 12/20/2023 10:58:29 PM TECHNIQUE: CT of the neck was performed with the administration of  intravenous contrast. Multiplanar reformatted images are provided for review. Automated exposure control, iterative reconstruction, and/or weight based adjustment of the mA/kV was utilized to reduce the radiation dose to as low as reasonably achievable. COMPARISON: None available. CLINICAL HISTORY: Epiglottitis or tonsillitis suspected; stridor. increased shortness of breath. FINDINGS: AERODIGESTIVE TRACT: No discrete  mass. No edema. Closed glottis, which limits assessment of the larynx. SALIVARY GLANDS: The parotid and submandibular glands are unremarkable. THYROID : Unremarkable. LYMPH NODES: No suspicious cervical lymphadenopathy. SOFT TISSUES: Thyroid  nodule further evaluated on August 22, 2023 ultrasound. BRAIN, ORBITS, SINUSES AND MASTOIDS: No acute abnormality. LUNGS AND MEDIASTINUM: See concurrent CT chest for intrathoracic evaluation. BONES: No focal bone abnormality. IMPRESSION: 1. No acute findings or discrete mass in the neck. 2. See concurrent CT chest for intrathoracic evaluation. Electronically signed by: Gilmore Molt MD 12/20/2023 11:28 PM EDT RP Workstation: [id number]     LOS: 0 days   Donalda Applebaum, MD  [facility]    To contact the attending provider between 7A-7P or the covering provider during after hours 7P-7A, please log into the web site [email or web address] and access using universal [place] password for that web site. If you do not have the password, please call the hospital operator.  12/22/2023, 8:55 AM

## 2023-12-22 NOTE — TOC Initial Note (Signed)
 Transition of Care [facility]) - Initial/Assessment Note    Patient Details  Name: Amanda Palmer MRN: [id number] Date of Birth: 1933/12/15  [facility]) CM/SW Contact:    Pradyun Ishman E Cannon Arreola, LCSW Phone Number: 12/22/2023, 9:28 AM  Clinical Narrative:                 Patient was admitted for stridor. Patient not fully oriented per chart review. CSW spoke with patient's niece Dickey. Dickey confirms patient resides at [facility] in [place], has lived there since February. Dickey states patient uses a walker at baseline and sometimes participates in their group exercise classes. Dickey not aware of any current [facility] services. Dickey was notified that therapy evals are pending and ICM will follow up based on their recommendations. CSW called [facility] - spoke with Rosaline. She states they would like to see how patient does with PT/OT/ST and she will be the contact at [facility] with updates.  ICM will continue to follow.  Expected Discharge Plan: Assisted Living Barriers to Discharge: Continued Medical Work up   Patient Goals and CMS Choice   CMS Medicare.gov Compare Post Acute Care list provided to:: Patient Represenative (must comment) Choice offered to / list presented to : NA      Expected Discharge Plan and Services       Living arrangements for the past 2 months: Assisted Living Facility                                      Prior Living Arrangements/Services Living arrangements for the past 2 months: Assisted Living Facility Lives with:: Facility Resident Patient language and need for interpreter reviewed:: Yes Do you feel safe going back to the place where you live?: Yes      Need for Family Participation in Patient Care: Yes (Comment) Care giver support system in place?: Yes (comment) Current home services: DME Criminal Activity/Legal Involvement Pertinent to Current Situation/Hospitalization: No - Comment as needed  Activities of Daily Living   ADL  Screening (condition at time of admission) Independently performs ADLs?: Yes (appropriate for developmental age) Is the patient deaf or have difficulty hearing?: Yes Does the patient have difficulty seeing, even when wearing glasses/contacts?: No Does the patient have difficulty concentrating, remembering, or making decisions?: No  Permission Sought/Granted Permission sought to share information with : Facility Industrial/product designer granted to share information with : Yes, Verbal Permission Granted (by patient's niece Dickey)     Permission granted to share info w AGENCY: [facility]        Emotional Assessment         Alcohol / Substance Use: Not Applicable Psych Involvement: No (comment)  Admission diagnosis:  Stridor [R06.1] Laryngeal stridor [Q31.5] Patient Active Problem List   Diagnosis Date Noted   Stridor 12/21/2023   Persistent atrial fibrillation (HCC) 12/21/2023   At risk for falls 11/27/2023   External hemorrhoids 11/27/2023   Moderate cognitive impairment 11/27/2023   Nonischemic congestive cardiomyopathy (HCC) 11/27/2023   Pruritus of vagina 11/24/2023   Insomnia 10/31/2023   Hypothyroidism 08/29/2023   Multinodular goiter 08/15/2023   Acute eczematoid otitis externa 07/07/2023   Anemia 07/07/2023   Arthropathy of left shoulder 07/07/2023   Diaphragmatic hernia 07/07/2023   Diarrhea 07/07/2023   Elevated liver enzymes 07/07/2023   Falling 07/07/2023   Hardening of the aorta (main artery of the heart) 07/07/2023   Hyperglycemia  due to type 2 diabetes mellitus (HCC) 07/07/2023   Iron deficiency anemia 07/07/2023   Low back pain 07/07/2023   Osteoporosis 07/07/2023   Polyneuropathy 07/07/2023   Residual hemorrhoidal skin tags 07/07/2023   Thrombophilia 07/07/2023   Nocturnal hypoxemia 06/03/2023   Acute cystitis 04/03/2023   Acute viral syndrome 04/03/2023   Influenza A 04/03/2023   Nausea 04/03/2023   Nursing home resident 04/03/2023    Altered mental status 03/15/2023   Hypertensive emergency 03/15/2023   Prolonged QT interval 03/25/2022   Multiple lung nodules on CT 03/25/2022   Periorbital hematoma of left eye 03/25/2022   Dysphagia 01/30/2022   Dizziness and giddiness 08/09/2021   Weakness 08/09/2021   History of falling 08/09/2021   Dehydration 08/09/2021   Long term (current) use of anticoagulants 07/09/2021   Dependence on supplemental oxygen  07/09/2021   History of transient ischemic attack 03/11/2021   Chronic atrial fibrillation (HCC) 05/09/2020   Secondary hypercoagulable state 05/09/2020   Abnormal CT of the chest 07/06/2019   Closed displaced fracture of fifth metatarsal bone of left foot 01/11/2019   Hematoma of arm, right, initial encounter 08/24/2018   Obstruction of esophagus 10/22/2016   H/O adenomatous polyp of colon 10/22/2016   Posterior vitreous detachment of both eyes 04/10/2016   Pseudophakia 04/10/2016   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   Vocal fold atrophy 08/16/2015   Cough 03/29/2015   Upper airway cough syndrome 03/29/2015   Dyspnea 02/23/2015   Trimalleolar fracture of ankle, closed 05/31/2014   Syncope and collapse 05/31/2014   Scalp laceration 05/31/2014   AKI (acute kidney injury) 05/31/2014   Syncope 05/31/2014   COPD (chronic obstructive pulmonary disease) (HCC) 05/22/2014   COPD exacerbation (HCC) 05/22/2014   Arrhythmia 05/17/2014   Pain in left hip 04/12/2013   Peripheral vascular disease 08/01/2011   GERD without esophagitis 08/01/2011   Depressive disorder 08/01/2011   Gastritis and duodenitis 08/01/2011   History of breast cancer 08/01/2011   Mixed hyperlipidemia 08/01/2011   Polypharmacy 08/01/2011   Vitamin D deficiency 08/01/2011   Anxiety disorder 08/01/2011   Essential hypertension 08/01/2011   DDD (degenerative disc disease), cervical 08/01/2011   PCP:  Shona Norleen PEDLAR, MD Pharmacy:   [facility] [place], [place] - [address] [place]  [place]. [address] [place] [address] Phone: [phone] Fax: [phone]     Social Drivers of Health (SDOH) Social History: SDOH Screenings   Food Insecurity: No Food Insecurity (12/21/2023)  Housing: Low Risk  (12/21/2023)  Transportation Needs: No Transportation Needs (12/21/2023)  Utilities: Not At Risk (12/21/2023)  Alcohol Screen: Low Risk  (07/04/2023)  Depression (PHQ2-9): Low Risk  (07/04/2023)  Financial Resource Strain: Low Risk  (07/04/2023)  Physical Activity: Inactive (07/04/2023)  Social Connections: Moderately Integrated (12/21/2023)  Stress: No Stress Concern Present (07/04/2023)  Tobacco Use: Low Risk  (12/21/2023)  Health Literacy: Inadequate Health Literacy (07/04/2023)   SDOH Interventions:     Readmission Risk Interventions     No data to display

## 2023-12-22 NOTE — Care Management Obs Status (Signed)
 MEDICARE OBSERVATION STATUS NOTIFICATION   Patient Details  Name: Amanda Palmer MRN: [id number] Date of Birth: 21-Nov-1933   Medicare Observation Status Notification Given:  Yes Verbally reviewed observation notice with Dickey Fuller telephonically at [phone]. Will left a copy in the patients room    [name] 12/22/2023, 8:40 AM

## 2023-12-22 NOTE — Evaluation (Signed)
 RT Evaluate and Treat Note  12/22/2023   Breathing is (select one): Same as normal    The following was found on auscultation (select multiple):  Bilateral Breath Sounds: Diminished (12/22/23 0800)             Cough Assessment: Cough: Non-productive;Dry (12/22/23 0800)    Most Recent Chest Xray:... (No results found.    The following medications and/or interventions were ordered/changed/discontinued as part of the Respiratory Treatment protocol:  Medication Changes: no changes made   Airway Clearance Changes: no changes made   Oxygen  Therapy Changes: current therapy appropriate. 2-3L baseline at bedtime/PRN.

## 2023-12-23 DIAGNOSIS — R061 Stridor: Secondary | ICD-10-CM | POA: Diagnosis not present

## 2023-12-23 DIAGNOSIS — N179 Acute kidney failure, unspecified: Secondary | ICD-10-CM | POA: Diagnosis not present

## 2023-12-23 DIAGNOSIS — Z7401 Bed confinement status: Secondary | ICD-10-CM | POA: Diagnosis not present

## 2023-12-23 DIAGNOSIS — K219 Gastro-esophageal reflux disease without esophagitis: Secondary | ICD-10-CM | POA: Diagnosis not present

## 2023-12-23 DIAGNOSIS — R0603 Acute respiratory distress: Secondary | ICD-10-CM | POA: Diagnosis not present

## 2023-12-23 DIAGNOSIS — Q315 Congenital laryngomalacia: Secondary | ICD-10-CM

## 2023-12-23 DIAGNOSIS — R4182 Altered mental status, unspecified: Secondary | ICD-10-CM | POA: Diagnosis not present

## 2023-12-23 DIAGNOSIS — J42 Unspecified chronic bronchitis: Secondary | ICD-10-CM | POA: Diagnosis not present

## 2023-12-23 LAB — CBC
HCT: 26.6 % — ABNORMAL LOW (ref 36.0–46.0)
Hemoglobin: 8.4 g/dL — ABNORMAL LOW (ref 12.0–15.0)
MCH: 31.8 pg (ref 26.0–34.0)
MCHC: 31.6 g/dL (ref 30.0–36.0)
MCV: 100.8 fL — ABNORMAL HIGH (ref 80.0–100.0)
Platelets: 207 K/uL (ref 150–400)
RBC: 2.64 MIL/uL — ABNORMAL LOW (ref 3.87–5.11)
RDW: 14.6 % (ref 11.5–15.5)
WBC: 5.7 K/uL (ref 4.0–10.5)
nRBC: 0 % (ref 0.0–0.2)

## 2023-12-23 LAB — BASIC METABOLIC PANEL WITH GFR
Anion gap: 10 (ref 5–15)
BUN: 33 mg/dL — ABNORMAL HIGH (ref 8–23)
CO2: 25 mmol/L (ref 22–32)
Calcium: 8.7 mg/dL — ABNORMAL LOW (ref 8.9–10.3)
Chloride: 101 mmol/L (ref 98–111)
Creatinine, Ser: 1.45 mg/dL — ABNORMAL HIGH (ref 0.44–1.00)
GFR, Estimated: 35 mL/min — ABNORMAL LOW (ref >60)
Glucose, Bld: 98 mg/dL (ref 70–99)
Potassium: 3.6 mmol/L (ref 3.5–5.1)
Sodium: 136 mmol/L (ref 135–145)

## 2023-12-23 LAB — MAGNESIUM: Magnesium: 2.3 mg/dL (ref 1.7–2.4)

## 2023-12-23 MED ORDER — HYDRALAZINE HCL 50 MG PO TABS: 50.0000 mg | ORAL_TABLET | Freq: Three times a day (TID) | ORAL | 1 refills | Status: AC

## 2023-12-23 MED ORDER — PREDNISONE 10 MG PO TABS
ORAL_TABLET | ORAL | 0 refills | Status: AC
Start: 2023-12-23 — End: ?

## 2023-12-23 NOTE — Evaluation (Signed)
 RT Evaluate and Treat Note  12/23/2023   Breathing is (select one): Same as normal    The following was found on auscultation (select multiple):  Bilateral Breath Sounds: Diminished (12/23/23 0735)             Cough Assessment: Cough: None (12/23/23 0735)    Most Recent Chest Xray:... (No results found.    The following medications and/or interventions were ordered/changed/discontinued as part of the Respiratory Treatment protocol:  Medication Changes:  No changes at this time.    Airway Clearance Changes: No changes at this time.    Oxygen  Therapy Changes: No changes at this time. Pt wears 2-3L at bedtime at baseline.

## 2023-12-23 NOTE — TOC Transition Note (Addendum)
 Transition of Care [facility]) - Discharge Note   Patient Details  Name: Amanda Palmer MRN: [id number] Date of Birth: Aug 21, 1933  Transition of Care [facility]) CM/SW Contact:  Stacy Deshler E Laddie Math, LCSW Phone Number: 12/23/2023, 9:08 AM   Clinical Narrative:    Discharge to [facility] ALF today.  Confirmed with Resident Care Coordinator [employer].  Faxed DC Summary, FL2 with DC Med List, and HH orders to [facility] per her request. Rosaline states they do not have a driver available to transport patient today. Confirmed address on chart is correct. Confirmed Pharmacy for scripts -[facility]. Rosaline states she will make [facility] referral to their preferred agency [facility]).  LVM for patient's niece Dickey.  EMS paperwork completed. [facility] called for transport, RN notified.   Final next level of care: Assisted Living Barriers to Discharge: Barriers Resolved   Patient Goals and CMS Choice   CMS Medicare.gov Compare Post Acute Care list provided to:: Patient Represenative (must comment) Choice offered to / list presented to : NA      Discharge Placement                    Patient and family notified of of transfer: 12/23/23  Discharge Plan and Services Additional resources added to the After Visit Summary for                            [facility] Arranged: PT, OT       Representative spoke with at [facility] Agency: Rosaline at ALF to arrange through [employer]  Social Drivers of Health (SDOH) Interventions SDOH Screenings   Food Insecurity: No Food Insecurity (12/21/2023)  Housing: Low Risk  (12/21/2023)  Transportation Needs: No Transportation Needs (12/21/2023)  Utilities: Not At Risk (12/21/2023)  Alcohol Screen: Low Risk  (07/04/2023)  Depression (PHQ2-9): Low Risk  (07/04/2023)  Financial Resource Strain: Low Risk  (07/04/2023)  Physical Activity: Inactive (07/04/2023)  Social Connections: Moderately Integrated (12/21/2023)  Stress: No Stress Concern Present (07/04/2023)  Tobacco  Use: Low Risk  (12/21/2023)  Health Literacy: Inadequate Health Literacy (07/04/2023)     Readmission Risk Interventions     No data to display

## 2023-12-23 NOTE — TOC CM/SW Note (Addendum)
 Transition of Care (TOC) CM/SW Note   3:05 PM  Call from [place] at [facility] who states patient has an allergy  to Prednisone .  Notified MD - per MD, patient had predinsone during hospital stay with no issues and that he has spoken with ALF Pharmacy about this already.  Updated Rosaline.

## 2023-12-23 NOTE — Discharge Summary (Signed)
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 PMH: breast cancer in 2013, COPD, hypertension, hyperlipidemia, gastroesophageal reflux disease, persistent atrial fibrillation, diastolic congestive heart failure (Echo 03/2022 EF 60-65% G2DD), anxiety/depression. Clinical Impression: Clinical Impression: Pt presented with normal oropharyngeal swallow.  No coughing/choking occurred during study as noted during clinical swallow evaluation. Oral control/propulsion was WNL.  There was occasional high/transient penetration of thin liquids (PAS score of 2), considered to be [facility]. There was no aspiration over the course of multiple swallows.  Efficiency of swallow was normal - good propulsion through pharynx and PES.  If symptoms persist, she may benefit from esophageal w/u.  Recommend regular diet/thin liquids; no SLP f/u is needed at this time. Factors that may increase risk of adverse event in presence of aspiration [name] & [name] 2021): No data recorded Recommendations/Plan: Swallowing Evaluation Recommendations Swallowing Evaluation Recommendations Recommendations: PO diet PO Diet  Recommendation: Regular; Thin liquids (Level 0) Liquid Administration via: Cup; Straw Medication Administration: Whole meds with liquid Supervision: Patient able to self-feed Oral care recommendations: Oral care BID (2x/day) Treatment Plan Treatment Plan Treatment recommendations: No treatment recommended at this time Follow-up recommendations: No SLP follow up Recommendations Recommendations for follow up therapy are one component of a multi-disciplinary discharge planning process, led by the attending physician.  Recommendations may be updated based on patient status, additional functional criteria and insurance authorization. Assessment: Orofacial Exam: Orofacial Exam Oral Cavity: Oral Hygiene: WFL Oral Cavity - Dentition: Adequate natural dentition Orofacial Anatomy: WFL Oral Motor/Sensory Function: WFL Anatomy: Anatomy: WFL; Suspected cervical osteophytes Boluses Administered: Boluses Administered Boluses Administered: Thin liquids (Level 0); Mildly thick liquids (Level 2, nectar thick); Moderately thick liquids (Level 3, honey thick); Puree; Solid  Oral Impairment Domain:  Oral Impairment Domain Lip Closure: No labial escape Tongue control during bolus hold: Cohesive bolus between tongue to palatal seal Bolus preparation/mastication: Timely and efficient chewing and mashing Bolus transport/lingual motion: Brisk tongue motion Oral residue: Complete oral clearance Location of oral residue : N/A Initiation of pharyngeal swallow : Pyriform sinuses  Pharyngeal Impairment Domain: Pharyngeal Impairment Domain Soft palate elevation: No bolus between soft palate (SP)/pharyngeal wall (PW) Laryngeal elevation: Complete superior movement of thyroid  cartilage with complete approximation of arytenoids to epiglottic petiole Anterior hyoid excursion: Complete anterior movement Epiglottic movement: Complete inversion Laryngeal vestibule closure: Incomplete, narrow column air/contrast in laryngeal vestibule Pharyngeal stripping  wave : Present - complete Pharyngeal contraction (A/P view only): N/A Pharyngoesophageal segment opening: Complete distension and complete duration, no obstruction of flow Tongue base retraction: No contrast between tongue base and posterior pharyngeal wall (PPW) Pharyngeal residue: Complete pharyngeal clearance  Esophageal Impairment Domain: Esophageal Impairment Domain Esophageal clearance upright position: Complete clearance, esophageal coating Pill: Pill Consistency administered: Thin liquids (Level 0) Penetration/Aspiration Scale Score: Penetration/Aspiration Scale Score 1.  Material does not enter airway: Mildly thick liquids (Level 2, nectar thick); Moderately thick liquids (Level 3, honey thick); Puree; Solid 2.  Material enters airway, remains ABOVE vocal cords then ejected out: Thin liquids (Level 0) Compensatory Strategies: No data recorded  General Information: Caregiver present: No  Diet Prior to this Study: NPO   No data recorded  No data recorded  Supplemental O2: Nasal cannula   History of Recent Intubation: No  Behavior/Cognition: Alert; Cooperative Self-Feeding Abilities: Able to self-feed Baseline vocal quality/speech: Dysphonic Volitional Cough: Able to elicit Volitional Swallow: Able to elicit Exam Limitations: No limitations Goal Planning: No data recorded No data recorded No data recorded No data recorded Consulted and agree with results and recommendations: Patient Pain: Pain Assessment Pain Assessment: No/denies pain End of Session: Start Time:SLP Start Time (ACUTE ONLY): 1156 Stop Time: SLP Stop Time (ACUTE ONLY): 1210 Time Calculation:SLP Time Calculation (min) (ACUTE ONLY): 14 min Charges: SLP Evaluations $ SLP Speech Visit: 1 Visit SLP Evaluations $BSS Swallow: 1 Procedure $MBS Swallow: 1 Procedure SLP visit diagnosis: SLP Visit Diagnosis: Dysphagia, unspecified (R13.10) Past Medical History: Past Medical History: Diagnosis Date  A-fib (HCC)   Anxiety   Arthritis   Cancer (HCC)   breast   COPD (chronic obstructive pulmonary disease) (HCC)   Depression   GERD (gastroesophageal reflux disease)   Hypertension   Nonischemic cardiomyopathy (HCC)   TIA (transient ischemic attack)   remote Past Surgical History: Past Surgical History: Procedure Laterality Date  ABDOMINAL HYSTERECTOMY    APPENDECTOMY    BACK SURGERY    total of five surgeries  BREAST BIOPSY Left   benign  BREAST CAPSULECTOMY WITH IMPLANT EXCHANGE Right 05/27/2016  Procedure: REMOVAL AND REPLACEMENT OF RIGHT BREAST IMPLANT AND BREAST CAPSULECTOMY;  Surgeon: Elna Pick, MD;  Location: [facility];  Service: Plastics;  Laterality: Right;  CARDIAC CATHETERIZATION    CARDIOVERSION N/A 05/17/2020  Procedure: CARDIOVERSION;  Surgeon: Alveta Aleene PARAS, MD;  Location[facility];  Service: Cardiovascular;  Laterality: N/A;  CHOLECYSTECTOMY    COLONOSCOPY  08/2014  Dr. Donnel: Small sessile polyp at the cecum, removed, tubular adenoma  ESOPHAGOGASTRODUODENOSCOPY  06/2013  Dr. Donnel: Hiatal hernia, Schatzki ring, nonobstructive.  MASTECTOMY    right   ORIF ANKLE FRACTURE Left 06/01/2014  Procedure: OPEN REDUCTION INTERNAL FIXATION (ORIF) LEFT ANKLE FRACTURE;  Surgeon: Oneil JAYSON Herald, MD;  Location: [facility] OR;  Service: Orthopedics;  Laterality: Left;  ROTATOR CUFF  REPAIR    left  TEE WITHOUT CARDIOVERSION N/A 05/17/2020  Procedure: TRANSESOPHAGEAL ECHOCARDIOGRAM (TEE);  Surgeon: Alveta Aleene PARAS, MD;  Location: [facility] ENDOSCOPY;  Service: Cardiovascular;  Laterality: N/A;  TOTAL HIP ARTHROPLASTY    right  TOTAL KNEE ARTHROPLASTY    bilateral [place] 12/22/2023, 1:18 PM    PERTINENT LAB RESULTS: CBC: Recent Labs    12/22/23 0316 12/23/23 0237  WBC 5.0 5.7  HGB 8.6* 8.4*  HCT 26.7* 26.6*  PLT 215 207   CMET CMP     Component Value Date/Time   NA 136 12/23/2023 0237   NA 129 (L) 05/23/2023 1336   K 3.6 12/23/2023 0237   CL 101 12/23/2023 0237   CO2 25 12/23/2023 0237   GLUCOSE 98 12/23/2023 0237   BUN 33 (H)  12/23/2023 0237   BUN 17 05/23/2023 1336   CREATININE 1.45 (H) 12/23/2023 0237   CALCIUM  8.7 (L) 12/23/2023 0237   PROT 6.3 (L) 12/21/2023 0543   PROT 7.5 05/23/2023 1336   ALBUMIN 2.9 (L) 12/21/2023 0543   ALBUMIN 3.3 (L) 05/23/2023 1336   AST 22 12/21/2023 0543   ALT 17 12/21/2023 0543   ALKPHOS 67 12/21/2023 0543   BILITOT 0.5 12/21/2023 0543   BILITOT 0.4 05/23/2023 1336   GFR 62.54 02/23/2015 1058   EGFR 50 (L) 05/23/2023 1336   GFRNONAA 35 (L) 12/23/2023 0237    GFR Estimated Creatinine Clearance: 22.6 mL/min (A) (by C-G formula based on SCr of 1.45 mg/dL (H)). No results for input(s): LIPASE, AMYLASE in the last 72 hours. No results for input(s): CKTOTAL, CKMB, CKMBINDEX, TROPONINI in the last 72 hours. Invalid input(s): POCBNP No results for input(s): DDIMER in the last 72 hours. No results for input(s): HGBA1C in the last 72 hours. No results for input(s): CHOL, HDL, LDLCALC, TRIG, CHOLHDL, LDLDIRECT in the last 72 hours. No results for input(s): TSH, T4TOTAL, T3FREE, THYROIDAB in the last 72 hours.  Invalid input(s): FREET3 No results for input(s): VITAMINB12, FOLATE, FERRITIN, TIBC, IRON, RETICCTPCT in the last 72 hours. Coags: No results for input(s): INR in the last 72 hours.  Invalid input(s): PT Microbiology: Recent Results (from the past 240 hours)  Urine Culture     Status: Abnormal   Collection Time: 12/21/23  2:23 AM   Specimen: Urine, Random  Result Value Ref Range Status   Specimen Description URINE, RANDOM  Final   Special Requests   Final    NONE Reflexed from [phone] [phone] Performed at [facility], [address] [place], [place] [address]    Culture (A)  Final    >=100,000 COLONIES/mL MULTIPLE SPECIES PRESENT, SUGGEST RECOLLECTION   Report Status 12/22/2023 FINAL  Final  Resp panel by RT-PCR (RSV, Flu A&B, Covid) Anterior Nasal Swab     Status: None   Collection Time: 12/21/23 11:18 AM    Specimen: Anterior Nasal Swab  Result Value Ref Range Status   SARS Coronavirus 2 by RT PCR NEGATIVE NEGATIVE Final   Influenza A by PCR NEGATIVE NEGATIVE Final   Influenza B by PCR NEGATIVE NEGATIVE Final    Comment: (NOTE) The Xpert Xpress SARS-CoV-2/FLU/RSV plus assay is intended as an aid in the diagnosis of influenza from Nasopharyngeal swab specimens and should not be used as a sole basis for treatment. Nasal washings and aspirates are unacceptable for Xpert Xpress SARS-CoV-2/FLU/RSV testing.  Fact Sheet for Patients: BloggerCourse.com  Fact Sheet for Healthcare Providers: SeriousBroker.it  This test is not yet approved or cleared  by the [place]  FDA and has been authorized for detection and/or diagnosis of SARS-CoV-2 by FDA under an Emergency Use Authorization (EUA). This EUA will remain in effect (meaning this test can be used) for the duration of the COVID-19 declaration under Section 564(b)(1) of the Act, 21 U.S.C. section 360bbb-3(b)(1), unless the authorization is terminated or revoked.     Resp Syncytial Virus by PCR NEGATIVE NEGATIVE Final    Comment: (NOTE) Fact Sheet for Patients: BloggerCourse.com  Fact Sheet for Healthcare Providers: SeriousBroker.it  This test is not yet approved or cleared by the [place]  FDA and has been authorized for detection and/or diagnosis of SARS-CoV-2 by FDA under an Emergency Use Authorization (EUA). This EUA will remain in effect (meaning this test can be used) for the duration of the COVID-19 declaration under Section 564(b)(1) of the Act, 21 U.S.C. section 360bbb-3(b)(1), unless the authorization is terminated or revoked.  Performed at [facility], [address] [place], [place] [address]   Respiratory (~20 pathogens) panel by PCR     Status: None   Collection Time: 12/21/23 11:18 AM   Specimen:  Nasopharyngeal Swab; Respiratory  Result Value Ref Range Status   Adenovirus NOT DETECTED NOT DETECTED Final   Coronavirus 229E NOT DETECTED NOT DETECTED Final    Comment: (NOTE) The Coronavirus on the Respiratory Panel, DOES NOT test for the novel  Coronavirus (2019 nCoV)    Coronavirus HKU1 NOT DETECTED NOT DETECTED Final   Coronavirus NL63 NOT DETECTED NOT DETECTED Final   Coronavirus OC43 NOT DETECTED NOT DETECTED Final   Metapneumovirus NOT DETECTED NOT DETECTED Final   Rhinovirus / Enterovirus NOT DETECTED NOT DETECTED Final   Influenza A NOT DETECTED NOT DETECTED Final   Influenza B NOT DETECTED NOT DETECTED Final   Parainfluenza Virus 1 NOT DETECTED NOT DETECTED Final   Parainfluenza Virus 2 NOT DETECTED NOT DETECTED Final   Parainfluenza Virus 3 NOT DETECTED NOT DETECTED Final   Parainfluenza Virus 4 NOT DETECTED NOT DETECTED Final   Respiratory Syncytial Virus NOT DETECTED NOT DETECTED Final   Bordetella pertussis NOT DETECTED NOT DETECTED Final   Bordetella Parapertussis NOT DETECTED NOT DETECTED Final   Chlamydophila pneumoniae NOT DETECTED NOT DETECTED Final   Mycoplasma pneumoniae NOT DETECTED NOT DETECTED Final    Comment: Performed at [facility], [address] [place], [place] [address]    FURTHER DISCHARGE INSTRUCTIONS:  Get Medicines reviewed and adjusted: Please take all your medications with you for your next visit with your Primary MD  Laboratory/radiological data: Please request your Primary MD to go over all hospital tests and procedure/radiological results at the follow up, please ask your Primary MD to get all Hospital records sent to his/her office.  In some cases, they will be blood work, cultures and biopsy results pending at the time of your discharge. Please request that your primary care M.D. goes through all the records of your hospital data and follows up on these results.  Also Note the following: If you experience worsening of your  admission symptoms, develop shortness of breath, life threatening emergency, suicidal or homicidal thoughts you must seek medical attention immediately by calling 911 or calling your MD immediately  if symptoms less severe.  You must read complete instructions/literature along with all the possible adverse reactions/side effects for all the Medicines you take and that have been prescribed to you. Take any new Medicines after you have completely understood and accpet all the possible adverse reactions/side  effects.   Do not drive when taking Pain medications or sleeping medications (Benzodaizepines)  Do not take more than prescribed Pain, Sleep and Anxiety Medications. It is not advisable to combine anxiety,sleep and pain medications without talking with your primary care practitioner  Special Instructions: If you have smoked or chewed Tobacco  in the last 2 yrs please stop smoking, stop any regular Alcohol  and or any Recreational drug use.  Wear Seat belts while driving.  Please note: You were cared for by a hospitalist during your hospital stay. Once you are discharged, your primary care physician will handle any further medical issues. Please note that NO REFILLS for any discharge medications will be authorized once you are discharged, as it is imperative that you return to your primary care physician (or establish a relationship with a primary care physician if you do not have one) for your post hospital discharge needs so that they can reassess your need for medications and monitor your lab values.  Total Time spent coordinating discharge including counseling, education and face to face time equals greater than 30 minutes.  Signed: Halen Mossbarger 12/23/2023 9:02 AM

## 2023-12-23 NOTE — Progress Notes (Signed)
 AVS and  DNR from chart  in packet for transport

## 2023-12-23 NOTE — Progress Notes (Signed)
 Patient discharged into the care of [facility] for transport to [facility]. Per Child psychotherapist, no need to call report.

## 2023-12-23 NOTE — NC FL2 (Signed)
 [place]  MEDICAID FL2 LEVEL OF CARE FORM     IDENTIFICATION  Patient Name: Amanda Palmer Birthdate: October 11, 1933 Sex: female Admission Date (Current Location): 12/20/2023  [facility] and [place] Number:  Producer, television/film/video and Address:  [employer], [address], [place], [place] [address]      Provider Number: [id number]  Attending Physician Name and Address:  Raenelle Donalda HERO, MD  Relative Name and Phone Number:  TOMIE PATRON (Niece)  [phone] (Mobile)    Current Level of Care: Hospital Recommended Level of Care: Assisted Living Facility Prior Approval Number:    Date Approved/Denied:   PASRR Number:    Discharge Plan:      Current Diagnoses: Patient Active Problem List   Diagnosis Date Noted   Stridor 12/21/2023   Persistent atrial fibrillation (HCC) 12/21/2023   At risk for falls 11/27/2023   External hemorrhoids 11/27/2023   Moderate cognitive impairment 11/27/2023   Nonischemic congestive cardiomyopathy (HCC) 11/27/2023   Pruritus of vagina 11/24/2023   Insomnia 10/31/2023   Hypothyroidism 08/29/2023   Multinodular goiter 08/15/2023   Acute eczematoid otitis externa 07/07/2023   Anemia 07/07/2023   Arthropathy of left shoulder 07/07/2023   Diaphragmatic hernia 07/07/2023   Diarrhea 07/07/2023   Elevated liver enzymes 07/07/2023   Falling 07/07/2023   Hardening of the aorta (main artery of the heart) 07/07/2023   Hyperglycemia due to type 2 diabetes mellitus (HCC) 07/07/2023   Iron deficiency anemia 07/07/2023   Low back pain 07/07/2023   Osteoporosis 07/07/2023   Polyneuropathy 07/07/2023   Residual hemorrhoidal skin tags 07/07/2023   Thrombophilia 07/07/2023   Nocturnal hypoxemia 06/03/2023   Acute cystitis 04/03/2023   Acute viral syndrome 04/03/2023   Influenza A 04/03/2023   Nausea 04/03/2023   Nursing home resident 04/03/2023   Altered mental status 03/15/2023   Hypertensive emergency 03/15/2023    Prolonged QT interval 03/25/2022   Multiple lung nodules on CT 03/25/2022   Periorbital hematoma of left eye 03/25/2022   Dysphagia 01/30/2022   Dizziness and giddiness 08/09/2021   Weakness 08/09/2021   History of falling 08/09/2021   Dehydration 08/09/2021   Long term (current) use of anticoagulants 07/09/2021   Dependence on supplemental oxygen  07/09/2021   History of transient ischemic attack 03/11/2021   Chronic atrial fibrillation (HCC) 05/09/2020   Secondary hypercoagulable state 05/09/2020   Abnormal CT of the chest 07/06/2019   Closed displaced fracture of fifth metatarsal bone of left foot 01/11/2019   Hematoma of arm, right, initial encounter 08/24/2018   Obstruction of esophagus 10/22/2016   H/O adenomatous polyp of colon 10/22/2016   Posterior vitreous detachment of both eyes 04/10/2016   Pseudophakia 04/10/2016   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   Vocal fold atrophy 08/16/2015   Cough 03/29/2015   Upper airway cough syndrome 03/29/2015   Dyspnea 02/23/2015   Trimalleolar fracture of ankle, closed 05/31/2014   Syncope and collapse 05/31/2014   Scalp laceration 05/31/2014   AKI (acute kidney injury) 05/31/2014   Syncope 05/31/2014   COPD (chronic obstructive pulmonary disease) (HCC) 05/22/2014   COPD exacerbation (HCC) 05/22/2014   Arrhythmia 05/17/2014   Pain in left hip 04/12/2013   Peripheral vascular disease 08/01/2011   GERD without esophagitis 08/01/2011   Depressive disorder 08/01/2011   Gastritis and duodenitis 08/01/2011   History of breast cancer 08/01/2011   Mixed hyperlipidemia 08/01/2011   Polypharmacy 08/01/2011   Vitamin D deficiency 08/01/2011   Anxiety disorder 08/01/2011   Essential hypertension 08/01/2011  DDD (degenerative disc disease), cervical 08/01/2011    Orientation RESPIRATION BLADDER Height & Weight     Self, Place  Normal Incontinent, External catheter Weight: 143 lb 11.8 oz (65.2 kg) Height:  5' (152.4 cm)  BEHAVIORAL  SYMPTOMS/MOOD NEUROLOGICAL BOWEL NUTRITION STATUS      Incontinent Diet (regular)  AMBULATORY STATUS COMMUNICATION OF NEEDS Skin     Verbally Other (Comment) (redness)                       Personal Care Assistance Level of Assistance  Bathing, Feeding, Dressing Bathing Assistance: Limited assistance Feeding assistance: Limited assistance Dressing Assistance: Limited assistance     Functional Limitations Info             SPECIAL CARE FACTORS FREQUENCY  PT (By licensed PT), OT (By licensed OT)     PT Frequency: home health OT Frequency: home health            Contractures      Additional Factors Info  Code Status, Allergies Code Status Info: Do not attempt resuscitation (DNR) PRE-ARREST INTERVENTIONS DESIRED Allergies Info: Morphine , Atenolol, Morphine  And Codeine, Penicillins, Prednisone , Sulfa Antibiotics, Sulfamethoxazole           Discharge Medications: STOP taking these medications     hydrochlorothiazide  12.5 MG capsule Commonly known as: MICROZIDE            TAKE these medications     acetaminophen  325 MG tablet Commonly known as: TYLENOL  Take 650 mg by mouth every 6 (six) hours as needed for moderate pain (pain score 4-6).    amiodarone  200 MG tablet Commonly known as: PACERONE  TAKE ONE TABLET BY MOUTH ONCE DAILY    apixaban  2.5 MG Tabs tablet Commonly known as: Eliquis  Take 1 tablet (2.5 mg total) by mouth 2 (two) times daily.    atorvastatin  20 MG tablet Commonly known as: LIPITOR Take 20 mg by mouth at bedtime.    calcium  carbonate 1500 (600 Ca) MG Tabs tablet Commonly known as: OSCAL Take 600 mg of elemental calcium  by mouth 2 (two) times daily with a meal.    cholecalciferol 25 MCG (1000 UNIT) tablet Commonly known as: VITAMIN D3 Take 1,000 Units by mouth daily.    cyanocobalamin 1000 MCG tablet Take 1,000 mcg by mouth daily.    Dextromethorphan-guaiFENesin  5-50 MG/5ML Syrp Take 10 mLs by mouth every 4 (four) hours as  needed.    esomeprazole  40 MG capsule Commonly known as: NexIUM  Take 1 capsule (40 mg total) by mouth daily before breakfast.    famotidine  10 MG tablet Commonly known as: PEPCID  Take 10 mg by mouth daily.    ferrous sulfate 325 (65 FE) MG EC tablet Take 325 mg by mouth daily.    Fluticasone -Umeclidin-Vilant 100-62.5-25 MCG/ACT Aepb Inhale 1 puff into the lungs at bedtime.    hydrALAZINE  50 MG tablet Commonly known as: APRESOLINE  Take 1 tablet (50 mg total) by mouth 3 (three) times daily. What changed:  medication strength how much to take    HYDROcodone -acetaminophen  5-325 MG tablet Commonly known as: NORCO/VICODIN Take 1 tablet by mouth every 4 (four) hours as needed. for pain    levalbuterol  0.63 MG/3ML nebulizer solution Commonly known as: XOPENEX  Take 0.63 mg by nebulization every 6 (six) hours as needed for wheezing or shortness of breath.    levothyroxine 75 MCG tablet Commonly known as: SYNTHROID Take 75 mcg by mouth daily.    Magnesium  Oxide 400 MG Caps Take 1 capsule (  400 mg total) by mouth daily.    metoprolol  tartrate 25 MG tablet Commonly known as: LOPRESSOR  Take 12.5 mg by mouth 2 (two) times daily.    mirtazapine 15 MG tablet Commonly known as: REMERON Take 15 mg by mouth at bedtime.    ondansetron  4 MG tablet Commonly known as: ZOFRAN  Take 4 mg by mouth every 8 (eight) hours as needed for nausea or vomiting. Take 1-2 tablets (4-8 mg) by mouth every eight hours as needed for up to 16 doses    OXYGEN  Inhale 2 L into the lungs at bedtime.    polyethylene glycol 17 g packet Commonly known as: MIRALAX / GLYCOLAX Take 17 g by mouth at bedtime.    predniSONE  10 MG tablet Commonly known as: DELTASONE  Take 40 mg daily for 2 days, 30 mg daily for 2 days, 20 mg daily for 2 days,10 mg daily for 1 days, then stop    sennosides-docusate sodium 8.6-50 MG tablet Commonly known as: SENOKOT-S Take 1 tablet by mouth at bedtime.    sertraline 50 MG  tablet Commonly known as: ZOLOFT Take 75 mg by mouth daily.    thiamine 100 MG tablet Commonly known as: Vitamin B-1 Take 100 mg by mouth daily.    triamcinolone  cream 0.5 % Commonly known as: KENALOG  Apply 1 Application topically in the morning and at bedtime.    valsartan 160 MG tablet Commonly known as: DIOVAN Take 160 mg by mouth daily.    venlafaxine  75 MG tablet Commonly known as: EFFEXOR  Take 75 mg by mouth 2 (two) times daily.         Relevant Imaging Results:  Relevant Lab Results:   Additional Information SS #: [id number]  Blanca Carreon E Adriane Guglielmo, LCSW

## 2023-12-24 DIAGNOSIS — F330 Major depressive disorder, recurrent, mild: Secondary | ICD-10-CM | POA: Diagnosis not present

## 2023-12-25 DIAGNOSIS — I5032 Chronic diastolic (congestive) heart failure: Secondary | ICD-10-CM | POA: Diagnosis not present

## 2023-12-25 DIAGNOSIS — R195 Other fecal abnormalities: Secondary | ICD-10-CM | POA: Diagnosis not present

## 2023-12-25 DIAGNOSIS — Z09 Encounter for follow-up examination after completed treatment for conditions other than malignant neoplasm: Secondary | ICD-10-CM | POA: Diagnosis not present

## 2023-12-25 DIAGNOSIS — I4819 Other persistent atrial fibrillation: Secondary | ICD-10-CM | POA: Diagnosis not present

## 2023-12-25 DIAGNOSIS — J9611 Chronic respiratory failure with hypoxia: Secondary | ICD-10-CM | POA: Diagnosis not present

## 2023-12-25 DIAGNOSIS — K219 Gastro-esophageal reflux disease without esophagitis: Secondary | ICD-10-CM | POA: Diagnosis not present

## 2023-12-25 DIAGNOSIS — F419 Anxiety disorder, unspecified: Secondary | ICD-10-CM | POA: Diagnosis not present

## 2023-12-25 DIAGNOSIS — F411 Generalized anxiety disorder: Secondary | ICD-10-CM | POA: Diagnosis not present

## 2023-12-25 DIAGNOSIS — R4189 Other symptoms and signs involving cognitive functions and awareness: Secondary | ICD-10-CM | POA: Diagnosis not present

## 2023-12-25 DIAGNOSIS — Z9981 Dependence on supplemental oxygen: Secondary | ICD-10-CM | POA: Diagnosis not present

## 2023-12-25 DIAGNOSIS — I10 Essential (primary) hypertension: Secondary | ICD-10-CM | POA: Diagnosis not present

## 2023-12-25 DIAGNOSIS — Z9181 History of falling: Secondary | ICD-10-CM | POA: Diagnosis not present

## 2023-12-25 DIAGNOSIS — F331 Major depressive disorder, recurrent, moderate: Secondary | ICD-10-CM | POA: Diagnosis not present

## 2023-12-25 DIAGNOSIS — J441 Chronic obstructive pulmonary disease with (acute) exacerbation: Secondary | ICD-10-CM | POA: Diagnosis not present

## 2023-12-25 DIAGNOSIS — I110 Hypertensive heart disease with heart failure: Secondary | ICD-10-CM | POA: Diagnosis not present

## 2023-12-25 DIAGNOSIS — I482 Chronic atrial fibrillation, unspecified: Secondary | ICD-10-CM | POA: Diagnosis not present

## 2023-12-25 DIAGNOSIS — E039 Hypothyroidism, unspecified: Secondary | ICD-10-CM | POA: Diagnosis not present

## 2023-12-25 DIAGNOSIS — F32A Depression, unspecified: Secondary | ICD-10-CM | POA: Diagnosis not present

## 2023-12-25 DIAGNOSIS — N898 Other specified noninflammatory disorders of vagina: Secondary | ICD-10-CM | POA: Diagnosis not present

## 2023-12-25 DIAGNOSIS — I420 Dilated cardiomyopathy: Secondary | ICD-10-CM | POA: Diagnosis not present

## 2023-12-29 ENCOUNTER — Ambulatory Visit (INDEPENDENT_AMBULATORY_CARE_PROVIDER_SITE_OTHER): Admitting: Internal Medicine

## 2023-12-29 ENCOUNTER — Encounter: Payer: Self-pay | Admitting: Internal Medicine

## 2023-12-29 ENCOUNTER — Other Ambulatory Visit (HOSPITAL_COMMUNITY)
Admission: RE | Admit: 2023-12-29 | Discharge: 2023-12-29 | Disposition: A | Discharge status: Home / Self Care | Source: Ambulatory Visit | Attending: Internal Medicine | Admitting: Internal Medicine

## 2023-12-29 VITALS — BP 131/57 | HR 69 | Temp 98.1°F | Ht 60.0 in | Wt 149.0 lb

## 2023-12-29 DIAGNOSIS — K224 Dyskinesia of esophagus: Secondary | ICD-10-CM

## 2023-12-29 DIAGNOSIS — R1314 Dysphagia, pharyngoesophageal phase: Secondary | ICD-10-CM

## 2023-12-29 DIAGNOSIS — D509 Iron deficiency anemia, unspecified: Secondary | ICD-10-CM | POA: Diagnosis not present

## 2023-12-29 LAB — HEMOGLOBIN AND HEMATOCRIT, BLOOD
HCT: 29.4 % — ABNORMAL LOW (ref 36.0–46.0)
Hemoglobin: 9.1 g/dL — ABNORMAL LOW (ref 12.0–15.0)

## 2023-12-29 NOTE — Progress Notes (Unsigned)
 Gastroenterology Progress Note    Primary Care Physician:  Shona Norleen PEDLAR, MD Primary Gastroenterologist:  Dr. Shaaron  Pre-Procedure History & Physical: HPI:  Amanda Palmer is a 89 y.o. female here for hospital follow-up.  Recent admitted with exacerbation of COPD spent a few days in the hospital was discharged last week.  Speech therapy felt patient had more esophageal dysphagia than oropharyngeal dysfunction and barium pill esophagram 8 weeks ago demonstrated some penetration no aspiration esophageal dysmotility was reported the barium pill passed without delay.  We know the patient has a Schatzki's ring I last dilated her ring with a 54 [place] Maloney dilator January 2024 this per provided long-lasting improvement until recently. It is interesting in the past week or so her hemoglobins been documented to drop 3 g no obvious GI bleeding. Patient is currently on oral prednisone  which she does not like.  Some perceived worsening of dysphagia while on this medication.  Patient is anticoagulated with Eliquis .  Atrial fibrillation  Past Medical History:  Diagnosis Date   A-fib (HCC)    Anxiety    Arthritis    Cancer (HCC)    breast   COPD (chronic obstructive pulmonary disease) (HCC)    Depression    GERD (gastroesophageal reflux disease)    Hypertension    Nonischemic cardiomyopathy (HCC)    TIA (transient ischemic attack)    remote    Past Surgical History:  Procedure Laterality Date   ABDOMINAL HYSTERECTOMY     APPENDECTOMY     BACK SURGERY     total of five surgeries   BREAST BIOPSY Left    benign   BREAST CAPSULECTOMY WITH IMPLANT EXCHANGE Right 05/27/2016   Procedure: REMOVAL AND REPLACEMENT OF RIGHT BREAST IMPLANT AND BREAST CAPSULECTOMY;  Surgeon: Elna Pick, MD;  Location: [facility];  Service: Plastics;  Laterality: Right;   CARDIAC CATHETERIZATION     CARDIOVERSION N/A 05/17/2020   Procedure: CARDIOVERSION;  Surgeon: Alveta Aleene JINNY, MD;   Location: [facility];  Service: Cardiovascular;  Laterality: N/A;   CHOLECYSTECTOMY     COLONOSCOPY  08/2014   Dr. Donnel: Small sessile polyp at the cecum, removed, tubular adenoma   ESOPHAGOGASTRODUODENOSCOPY  06/2013   Dr. Donnel: Hiatal hernia, Schatzki ring, nonobstructive.   MASTECTOMY     right    ORIF ANKLE FRACTURE Left 06/01/2014   Procedure: OPEN REDUCTION INTERNAL FIXATION (ORIF) LEFT ANKLE FRACTURE;  Surgeon: Oneil JAYSON Herald, MD;  Location: [facility] OR;  Service: Orthopedics;  Laterality: Left;   ROTATOR CUFF REPAIR     left   TEE WITHOUT CARDIOVERSION N/A 05/17/2020   Procedure: TRANSESOPHAGEAL ECHOCARDIOGRAM (TEE);  Surgeon: Alveta Aleene JINNY, MD;  Location: [facility];  Service: Cardiovascular;  Laterality: N/A;   TOTAL HIP ARTHROPLASTY     right   TOTAL KNEE ARTHROPLASTY     bilateral    Prior to Admission medications   Medication Sig Start Date End Date Taking? Authorizing Provider  acetaminophen  (TYLENOL ) 325 MG tablet Take 650 mg by mouth every 6 (six) hours as needed for moderate pain (pain score 4-6).   Yes [provider]  amiodarone  (PACERONE ) 200 MG tablet TAKE ONE TABLET BY MOUTH ONCE DAILY 10/29/22  Yes Branch, Dorn FALCON, MD  apixaban  (ELIQUIS ) 2.5 MG TABS tablet Take 1 tablet (2.5 mg total) by mouth 2 (two) times daily. 11/15/22  Yes Mealor, Augustus E, MD  atorvastatin  (LIPITOR) 20 MG tablet Take 20 mg by mouth at bedtime. 09/23/16  Yes [provider]  calcium  carbonate (OSCAL) 1500 (600 Ca) MG TABS tablet Take 600 mg of elemental calcium  by mouth 2 (two) times daily with a meal.   Yes [provider]  cholecalciferol (VITAMIN D3) 25 MCG (1000 UNIT) tablet Take 1,000 Units by mouth daily.   Yes [provider]  cyanocobalamin 1000 MCG tablet Take 1,000 mcg by mouth daily. 04/03/23  Yes Kopel, Prentice CROME, PA  Dextromethorphan-guaiFENesin  5-50 MG/5ML SYRP Take 10 mLs by mouth every 4 (four) hours as needed.   Yes [provider]   esomeprazole  (NEXIUM ) 40 MG capsule Take 1 capsule (40 mg total) by mouth daily before breakfast. 11/03/23  Yes Ezzard Sonny RAMAN, PA-C  famotidine  (PEPCID ) 10 MG tablet Take 10 mg by mouth daily. 06/12/22  Yes [provider]  ferrous sulfate 325 (65 FE) MG EC tablet Take 325 mg by mouth daily. 04/03/23  Yes Kopel, Prentice CROME, PA  Fluticasone -Umeclidin-Vilant 100-62.5-25 MCG/ACT AEPB Inhale 1 puff into the lungs at bedtime.   Yes [provider]  hydrALAZINE  (APRESOLINE ) 50 MG tablet Take 1 tablet (50 mg total) by mouth 3 (three) times daily. 12/23/23  Yes Ghimire, Donalda HERO, MD  HYDROcodone -acetaminophen  (NORCO/VICODIN) 5-325 MG tablet Take 1 tablet by mouth every 4 (four) hours as needed. for pain   Yes [provider]  levalbuterol  (XOPENEX ) 0.63 MG/3ML nebulizer solution Take 0.63 mg by nebulization every 6 (six) hours as needed for wheezing or shortness of breath.   Yes [provider]  levothyroxine (SYNTHROID) 75 MCG tablet Take 75 mcg by mouth daily. 11/12/23  Yes [provider]  Magnesium  Oxide 400 MG CAPS Take 1 capsule (400 mg total) by mouth daily. 07/09/21  Yes Lesia Ozell Prentice, PA-C  metoprolol  tartrate (LOPRESSOR ) 25 MG tablet Take 12.5 mg by mouth 2 (two) times daily. 03/25/23  Yes [provider]  mirtazapine (REMERON) 15 MG tablet Take 15 mg by mouth at bedtime.   Yes [provider]  ondansetron  (ZOFRAN ) 4 MG tablet Take 4 mg by mouth every 8 (eight) hours as needed for nausea or vomiting. Take 1-2 tablets (4-8 mg) by mouth every eight hours as needed for up to 16 doses 04/03/23  Yes Kopel, Prentice CROME, PA  OXYGEN  Inhale 2 L into the lungs at bedtime.   Yes [provider]  polyethylene glycol (MIRALAX / GLYCOLAX) 17 g packet Take 17 g by mouth at bedtime.   Yes [provider]  predniSONE  (DELTASONE ) 10 MG tablet Take 40 mg daily for 2 days, 30 mg daily for 2 days, 20 mg daily for 2 days,10 mg daily for 1  days, then stop 12/23/23  Yes Ghimire, Donalda HERO, MD  sennosides-docusate sodium (SENOKOT-S) 8.6-50 MG tablet Take 1 tablet by mouth at bedtime. 04/03/23  Yes Kopel, Prentice CROME, PA  sertraline (ZOLOFT) 50 MG tablet Take 75 mg by mouth daily. 10/31/23  Yes [provider]  thiamine (VITAMIN B-1) 100 MG tablet Take 100 mg by mouth daily. 04/03/23  Yes Kopel, Prentice CROME, PA  triamcinolone  cream (KENALOG ) 0.5 % Apply 1 Application topically in the morning and at bedtime. 02/05/22  Yes [provider]  valsartan (DIOVAN) 160 MG tablet Take 160 mg by mouth daily. 04/22/23 12/20/24 Yes [provider]    Allergies as of 12/29/2023 - Review Complete 12/29/2023  Allergen Reaction Noted   Morphine  Nausea And Vomiting 04/12/2013   Atenolol  11/03/2023   Morphine  and codeine Nausea And Vomiting 01/27/2014  Penicillins Rash 01/27/2014   Prednisone  Anxiety, Nausea And Vomiting, and Other (See Comments) 05/21/2014   Sulfa antibiotics Nausea And Vomiting 01/27/2014   Sulfamethoxazole Nausea And Vomiting 04/26/2015    Family History  Problem Relation Age of Onset   Heart disease Brother    Heart disease Sister    Breast cancer Sister    Leukemia Brother    Breast cancer Sister    Breast cancer Other        one deceased, one living   Colon cancer Neg Hx     Social History   Socioeconomic History   Marital status: Widowed    Spouse name: Not on file   Number of children: Not on file   Years of education: 12   Highest education level: 12th grade  Occupational History   Not on file  Tobacco Use   Smoking status: Never    Passive exposure: Never   Smokeless tobacco: Never  Vaping Use   Vaping status: Never Used  Substance and Sexual Activity   Alcohol use: No    Alcohol/week: 0.0 standard drinks of alcohol   Drug use: No   Sexual activity: Not Currently    Partners: Male  Other Topics Concern   Not on file  Social History Narrative   Not on file   Social  Drivers of Health   Financial Resource Strain: Low Risk  (07/04/2023)   Overall Financial Resource Strain (CARDIA)    Difficulty of Paying Living Expenses: Not hard at all  Food Insecurity: No Food Insecurity (12/21/2023)   Hunger Vital Sign    Worried About Running Out of Food in the Last Year: Never true    Ran Out of Food in the Last Year: Never true  Transportation Needs: No Transportation Needs (12/21/2023)   PRAPARE - Administrator, Civil Service (Medical): No    Lack of Transportation (Non-Medical): No  Physical Activity: Inactive (07/04/2023)   Exercise Vital Sign    Days of Exercise per Week: 0 days    Minutes of Exercise per Session: 0 min  Stress: No Stress Concern Present (07/04/2023)   [employer] of Occupational Health - Occupational Stress Questionnaire    Feeling of Stress : Only a little  Social Connections: Moderately Integrated (12/21/2023)   Social Connection and Isolation Panel    Frequency of Communication with Friends and Family: More than three times a week    Frequency of Social Gatherings with Friends and Family: More than three times a week    Attends Religious Services: More than 4 times per year    Active Member of [employer] or Organizations: No    Attends Banker Meetings: 1 to 4 times per year    Marital Status: Widowed  Intimate Partner Violence: Not At Risk (12/21/2023)   Humiliation, Afraid, Rape, and Kick questionnaire    Fear of Current or Ex-Partner: No    Emotionally Abused: No    Physically Abused: No    Sexually Abused: No    Review of Systems   See HPI, otherwise negative ROS  Physical Exam: BP (!) 131/57 (BP Location: Left Arm, Patient Position: Sitting, Cuff Size: Normal)   Pulse 69   Temp 98.1 F (36.7 C) (Oral)   Ht 5' (1.524 m)   Wt 149 lb (67.6 kg)   SpO2 97%   BMI 29.10 kg/m  General:   Frail elderly chronically ill appearing patient with nasal O2 in place.  She is accompanied by  her niece Elon Fuller.  Pleasant and cooperative in NAD Neck:  Supple; no masses or thyromegaly. No significant cervical adenopathy. Lungs: Distant breath sounds  heart:  Regular rate and rhythm; no murmurs, clicks, rubs,  or gallops. Abdomen: Non-distended, normal bowel sounds.  Soft and nontender without appreciable mass or hepatosplenomegaly.   Impression/Plan:   89 year old lady with COPD and multiple comorbidities include atrial fibrillation on chronic anticoagulation now with recurrent esophageal dysphagia.  Recent exacerbation of COPD.  On oral prednisone .  She has had her esophagus dilated previously with significant improvement in symptoms.  Although barium pill passed without impediment it is very possible she could have a persisting occult ring.  Also, dysmotility is in the mix as well which could be a contributing factor;  realistically, short of eating chopped up foods for the remainder of her life and grinding her pills up, there is really not anything else to be done at this time short of a repeat EGDED.  I discussed the risk benefits limitations of an upper endoscopy with dilation.  She did well with the procedure last year which took a total of 7 minutes.  She is an ASA 3.  I feel we can get her through the procedure safely and I would be optimistic that esophageal dilation would again give her a significant period of improved swallowing.  It also may be that she has got a coexisting Candida esophagitis taking oral prednisone  as she is relatively immunosuppressed.  An EGD with help sort that out as well.  After some discussion the patient would like to have an EGD with esophageal dilation.  Recent drop in hemoglobin of uncertain significance.  Clinically, no GI bleeding.  Recommendations:  After our discussion, we will go ahead and schedule an EGD with possible esophageal dilation (ASA 3 in room 3).  Will plan to do this no sooner than mid November (after you have seen your pulmonary  doctor)  Had a recent drop in your hemoglobin.  This is of uncertain significance.  Will repeat H&H now  We will need to get permission from your heart doctor regarding stopping the Eliquis  2 days prior to the upper endoscopy with esophagus dilation.  No change in your medications for the time being. Notice: This dictation was prepared with Dragon dictation along with smaller phrase technology. Any transcriptional errors that result from this process are unintentional and may not be corrected upon review.

## 2023-12-29 NOTE — Patient Instructions (Addendum)
 It was nice to see you today.  As discussed, muscles not coordinating in the esophagus but we know you have a narrowing where your esophagus connects to your stomach.  This is responded to stretching before and it is the only realistic treatment at this time  After our discussion, we will go ahead and schedule an EGD with possible esophageal dilation (ASA 3 in room 3).  Will plan to do this no sooner than mid November (after you have seen your pulmonary doctor)  Had a recent drop in your hemoglobin.  This is of uncertain significance.  Will repeat H&H now  We will need to get permission from your heart doctor regarding stopping the Eliquis  2 days prior to the upper endoscopy with esophagus dilation.  No change in your medications for the time being.

## 2023-12-30 ENCOUNTER — Telehealth: Payer: Self-pay | Admitting: *Deleted

## 2023-12-30 ENCOUNTER — Ambulatory Visit: Payer: Self-pay | Admitting: Internal Medicine

## 2023-12-30 DIAGNOSIS — Z9981 Dependence on supplemental oxygen: Secondary | ICD-10-CM | POA: Diagnosis not present

## 2023-12-30 DIAGNOSIS — E039 Hypothyroidism, unspecified: Secondary | ICD-10-CM | POA: Diagnosis not present

## 2023-12-30 DIAGNOSIS — J441 Chronic obstructive pulmonary disease with (acute) exacerbation: Secondary | ICD-10-CM | POA: Diagnosis not present

## 2023-12-30 DIAGNOSIS — I110 Hypertensive heart disease with heart failure: Secondary | ICD-10-CM | POA: Diagnosis not present

## 2023-12-30 DIAGNOSIS — I4819 Other persistent atrial fibrillation: Secondary | ICD-10-CM | POA: Diagnosis not present

## 2023-12-30 DIAGNOSIS — F419 Anxiety disorder, unspecified: Secondary | ICD-10-CM | POA: Diagnosis not present

## 2023-12-30 DIAGNOSIS — I5032 Chronic diastolic (congestive) heart failure: Secondary | ICD-10-CM | POA: Diagnosis not present

## 2023-12-30 DIAGNOSIS — J9611 Chronic respiratory failure with hypoxia: Secondary | ICD-10-CM | POA: Diagnosis not present

## 2023-12-30 DIAGNOSIS — F32A Depression, unspecified: Secondary | ICD-10-CM | POA: Diagnosis not present

## 2023-12-30 DIAGNOSIS — Z9181 History of falling: Secondary | ICD-10-CM | POA: Diagnosis not present

## 2023-12-30 NOTE — Telephone Encounter (Signed)
  Request for patient to stop medication prior to procedure or is needing cleareance  12/30/23  Amanda Palmer [birth year]  What type of surgery is being performed? Esophagogastroduodenoscopy (EGD) with esophageal dilation  When is surgery scheduled? TBD  What type of clearance is required (medical or pharmacy to hold medication or both? medication  Are there any medications that need to be held prior to surgery and how long? Eliquis  x 2 days  Name of physician performing surgery?  [facility] [employer]: [phone], option 5 Fax: [phone]  Anesthesia type (none, local, MAC, general)? MAC   ? Yes ? No Patient can hold medication as requested   Signature: ___________________________

## 2023-12-31 DIAGNOSIS — I482 Chronic atrial fibrillation, unspecified: Secondary | ICD-10-CM | POA: Diagnosis not present

## 2023-12-31 DIAGNOSIS — E7849 Other hyperlipidemia: Secondary | ICD-10-CM | POA: Diagnosis not present

## 2023-12-31 DIAGNOSIS — F331 Major depressive disorder, recurrent, moderate: Secondary | ICD-10-CM | POA: Diagnosis not present

## 2023-12-31 DIAGNOSIS — R195 Other fecal abnormalities: Secondary | ICD-10-CM | POA: Diagnosis not present

## 2023-12-31 DIAGNOSIS — I10 Essential (primary) hypertension: Secondary | ICD-10-CM | POA: Diagnosis not present

## 2023-12-31 DIAGNOSIS — D509 Iron deficiency anemia, unspecified: Secondary | ICD-10-CM | POA: Diagnosis not present

## 2023-12-31 DIAGNOSIS — J90 Pleural effusion, not elsewhere classified: Secondary | ICD-10-CM | POA: Diagnosis not present

## 2023-12-31 DIAGNOSIS — K625 Hemorrhage of anus and rectum: Secondary | ICD-10-CM | POA: Diagnosis not present

## 2023-12-31 DIAGNOSIS — R0602 Shortness of breath: Secondary | ICD-10-CM | POA: Diagnosis not present

## 2023-12-31 DIAGNOSIS — N898 Other specified noninflammatory disorders of vagina: Secondary | ICD-10-CM | POA: Diagnosis not present

## 2023-12-31 DIAGNOSIS — I420 Dilated cardiomyopathy: Secondary | ICD-10-CM | POA: Diagnosis not present

## 2023-12-31 DIAGNOSIS — R918 Other nonspecific abnormal finding of lung field: Secondary | ICD-10-CM | POA: Diagnosis not present

## 2023-12-31 DIAGNOSIS — F419 Anxiety disorder, unspecified: Secondary | ICD-10-CM | POA: Diagnosis not present

## 2023-12-31 DIAGNOSIS — K219 Gastro-esophageal reflux disease without esophagitis: Secondary | ICD-10-CM | POA: Diagnosis not present

## 2023-12-31 DIAGNOSIS — R0600 Dyspnea, unspecified: Secondary | ICD-10-CM | POA: Diagnosis not present

## 2023-12-31 DIAGNOSIS — Z9181 History of falling: Secondary | ICD-10-CM | POA: Diagnosis not present

## 2023-12-31 DIAGNOSIS — I739 Peripheral vascular disease, unspecified: Secondary | ICD-10-CM | POA: Diagnosis not present

## 2023-12-31 DIAGNOSIS — R4189 Other symptoms and signs involving cognitive functions and awareness: Secondary | ICD-10-CM | POA: Diagnosis not present

## 2023-12-31 NOTE — Telephone Encounter (Signed)
 Pharmacy please advise on holding Eliquis  for 2 days prior to Esophagogastroduodenoscopy (EGD) with esophageal dilation  scheduled for TBD. Last labs 12/23/2023. Thank you.

## 2024-01-01 DIAGNOSIS — Z7902 Long term (current) use of antithrombotics/antiplatelets: Secondary | ICD-10-CM | POA: Diagnosis not present

## 2024-01-01 DIAGNOSIS — I4891 Unspecified atrial fibrillation: Secondary | ICD-10-CM | POA: Diagnosis not present

## 2024-01-01 DIAGNOSIS — K921 Melena: Secondary | ICD-10-CM | POA: Diagnosis not present

## 2024-01-01 DIAGNOSIS — Z8673 Personal history of transient ischemic attack (TIA), and cerebral infarction without residual deficits: Secondary | ICD-10-CM | POA: Diagnosis not present

## 2024-01-01 DIAGNOSIS — R7989 Other specified abnormal findings of blood chemistry: Secondary | ICD-10-CM | POA: Diagnosis not present

## 2024-01-01 DIAGNOSIS — I10 Essential (primary) hypertension: Secondary | ICD-10-CM | POA: Diagnosis not present

## 2024-01-01 DIAGNOSIS — K219 Gastro-esophageal reflux disease without esophagitis: Secondary | ICD-10-CM | POA: Diagnosis not present

## 2024-01-01 DIAGNOSIS — J90 Pleural effusion, not elsewhere classified: Secondary | ICD-10-CM | POA: Diagnosis not present

## 2024-01-01 DIAGNOSIS — R0600 Dyspnea, unspecified: Secondary | ICD-10-CM | POA: Diagnosis not present

## 2024-01-01 DIAGNOSIS — R0689 Other abnormalities of breathing: Secondary | ICD-10-CM | POA: Diagnosis not present

## 2024-01-01 DIAGNOSIS — E785 Hyperlipidemia, unspecified: Secondary | ICD-10-CM | POA: Diagnosis not present

## 2024-01-01 DIAGNOSIS — E8770 Fluid overload, unspecified: Secondary | ICD-10-CM | POA: Diagnosis not present

## 2024-01-01 DIAGNOSIS — R918 Other nonspecific abnormal finding of lung field: Secondary | ICD-10-CM | POA: Diagnosis not present

## 2024-01-01 DIAGNOSIS — K625 Hemorrhage of anus and rectum: Secondary | ICD-10-CM | POA: Diagnosis not present

## 2024-01-02 ENCOUNTER — Ambulatory Visit: Admitting: Internal Medicine

## 2024-01-02 DIAGNOSIS — R7989 Other specified abnormal findings of blood chemistry: Secondary | ICD-10-CM | POA: Diagnosis not present

## 2024-01-02 DIAGNOSIS — Z8673 Personal history of transient ischemic attack (TIA), and cerebral infarction without residual deficits: Secondary | ICD-10-CM | POA: Diagnosis not present

## 2024-01-02 DIAGNOSIS — R0689 Other abnormalities of breathing: Secondary | ICD-10-CM | POA: Diagnosis not present

## 2024-01-02 DIAGNOSIS — K219 Gastro-esophageal reflux disease without esophagitis: Secondary | ICD-10-CM | POA: Diagnosis not present

## 2024-01-02 DIAGNOSIS — I10 Essential (primary) hypertension: Secondary | ICD-10-CM | POA: Diagnosis not present

## 2024-01-02 DIAGNOSIS — E8770 Fluid overload, unspecified: Secondary | ICD-10-CM | POA: Diagnosis not present

## 2024-01-02 DIAGNOSIS — Z7902 Long term (current) use of antithrombotics/antiplatelets: Secondary | ICD-10-CM | POA: Diagnosis not present

## 2024-01-02 DIAGNOSIS — E785 Hyperlipidemia, unspecified: Secondary | ICD-10-CM | POA: Diagnosis not present

## 2024-01-02 DIAGNOSIS — I4891 Unspecified atrial fibrillation: Secondary | ICD-10-CM | POA: Diagnosis not present

## 2024-01-02 DIAGNOSIS — Z79899 Other long term (current) drug therapy: Secondary | ICD-10-CM | POA: Diagnosis not present

## 2024-01-02 DIAGNOSIS — K921 Melena: Secondary | ICD-10-CM | POA: Diagnosis not present

## 2024-01-03 DIAGNOSIS — E8770 Fluid overload, unspecified: Secondary | ICD-10-CM | POA: Diagnosis not present

## 2024-01-03 DIAGNOSIS — I480 Paroxysmal atrial fibrillation: Secondary | ICD-10-CM | POA: Diagnosis not present

## 2024-01-03 DIAGNOSIS — R7989 Other specified abnormal findings of blood chemistry: Secondary | ICD-10-CM | POA: Diagnosis not present

## 2024-01-03 DIAGNOSIS — Z9981 Dependence on supplemental oxygen: Secondary | ICD-10-CM | POA: Diagnosis not present

## 2024-01-03 DIAGNOSIS — K219 Gastro-esophageal reflux disease without esophagitis: Secondary | ICD-10-CM | POA: Diagnosis not present

## 2024-01-03 DIAGNOSIS — I10 Essential (primary) hypertension: Secondary | ICD-10-CM | POA: Diagnosis not present

## 2024-01-03 DIAGNOSIS — Z79899 Other long term (current) drug therapy: Secondary | ICD-10-CM | POA: Diagnosis not present

## 2024-01-03 DIAGNOSIS — K921 Melena: Secondary | ICD-10-CM | POA: Diagnosis not present

## 2024-01-03 DIAGNOSIS — E785 Hyperlipidemia, unspecified: Secondary | ICD-10-CM | POA: Diagnosis not present

## 2024-01-03 DIAGNOSIS — R0689 Other abnormalities of breathing: Secondary | ICD-10-CM | POA: Diagnosis not present

## 2024-01-03 DIAGNOSIS — Z8673 Personal history of transient ischemic attack (TIA), and cerebral infarction without residual deficits: Secondary | ICD-10-CM | POA: Diagnosis not present

## 2024-01-05 DIAGNOSIS — F419 Anxiety disorder, unspecified: Secondary | ICD-10-CM | POA: Diagnosis not present

## 2024-01-05 DIAGNOSIS — E039 Hypothyroidism, unspecified: Secondary | ICD-10-CM | POA: Diagnosis not present

## 2024-01-05 DIAGNOSIS — J441 Chronic obstructive pulmonary disease with (acute) exacerbation: Secondary | ICD-10-CM | POA: Diagnosis not present

## 2024-01-06 DIAGNOSIS — M1612 Unilateral primary osteoarthritis, left hip: Secondary | ICD-10-CM | POA: Diagnosis not present

## 2024-01-06 DIAGNOSIS — R9389 Abnormal findings on diagnostic imaging of other specified body structures: Secondary | ICD-10-CM | POA: Diagnosis not present

## 2024-01-06 DIAGNOSIS — I739 Peripheral vascular disease, unspecified: Secondary | ICD-10-CM | POA: Diagnosis not present

## 2024-01-06 DIAGNOSIS — E039 Hypothyroidism, unspecified: Secondary | ICD-10-CM | POA: Diagnosis not present

## 2024-01-06 DIAGNOSIS — I482 Chronic atrial fibrillation, unspecified: Secondary | ICD-10-CM | POA: Diagnosis not present

## 2024-01-06 DIAGNOSIS — R419 Unspecified symptoms and signs involving cognitive functions and awareness: Secondary | ICD-10-CM | POA: Diagnosis not present

## 2024-01-06 DIAGNOSIS — G629 Polyneuropathy, unspecified: Secondary | ICD-10-CM | POA: Diagnosis not present

## 2024-01-06 DIAGNOSIS — J449 Chronic obstructive pulmonary disease, unspecified: Secondary | ICD-10-CM | POA: Diagnosis not present

## 2024-01-06 DIAGNOSIS — D649 Anemia, unspecified: Secondary | ICD-10-CM | POA: Diagnosis not present

## 2024-01-06 DIAGNOSIS — R1310 Dysphagia, unspecified: Secondary | ICD-10-CM | POA: Diagnosis not present

## 2024-01-06 DIAGNOSIS — Z0001 Encounter for general adult medical examination with abnormal findings: Secondary | ICD-10-CM | POA: Diagnosis not present

## 2024-01-06 DIAGNOSIS — F419 Anxiety disorder, unspecified: Secondary | ICD-10-CM | POA: Diagnosis not present

## 2024-01-07 DIAGNOSIS — J9611 Chronic respiratory failure with hypoxia: Secondary | ICD-10-CM | POA: Diagnosis not present

## 2024-01-07 NOTE — Telephone Encounter (Signed)
 Patient with diagnosis of afib on Eliquis  for anticoagulation.    Procedure: Esophagogastroduodenoscopy (EGD) with esophageal dilation  Date of procedure: TBD   CHA2DS2-VASc Score = 7   This indicates a 11.2% annual risk of stroke. The patient's score is based upon: CHF History: 1 HTN History: 1 Diabetes History: 0 Stroke History: 2 Vascular Disease History: 0 Age Score: 2 Gender Score: 1      CrCl 26 ml/min Platelet count 175  Patient has not had an Afib/aflutter ablation in the last 3 months, DCCV within the last 4 weeks or a watchman implanted in the last 45 days   Per office protocol, patient can hold Eliquis  for 2.5 days prior to procedure.    **This guidance is not considered finalized until pre-operative APP has relayed final recommendations.**

## 2024-01-07 NOTE — Telephone Encounter (Signed)
   Patient Name: Amanda Palmer  DOB: 05-07-1933 MRN: [id number]  Primary Cardiologist: Alvan Carrier, MD  Clinical pharmacists have reviewed the patient's past medical history, labs, and current medications as part of preoperative protocol coverage. The following recommendations have been made:  Patient with diagnosis of afib on Eliquis  for anticoagulation.     Procedure: Esophagogastroduodenoscopy (EGD) with esophageal dilation  Date of procedure: TBD     CHA2DS2-VASc Score = 7   This indicates a 11.2% annual risk of stroke. The patient's score is based upon: CHF History: 1 HTN History: 1 Diabetes History: 0 Stroke History: 2 Vascular Disease History: 0 Age Score: 2 Gender Score: 1     CrCl 26 ml/min Platelet count 175   Patient has not had an Afib/aflutter ablation in the last 3 months, DCCV within the last 4 weeks or a watchman implanted in the last 45 days    Per office protocol, patient can hold Eliquis  for 2.5 days prior to procedure.      I will route this recommendation to the requesting party via Epic fax function and remove from pre-op pool.  Please call with questions.  Lamarr Satterfield, NP 01/07/2024, 3:24 PM

## 2024-01-08 DIAGNOSIS — R0602 Shortness of breath: Secondary | ICD-10-CM | POA: Diagnosis not present

## 2024-01-08 DIAGNOSIS — R195 Other fecal abnormalities: Secondary | ICD-10-CM | POA: Diagnosis not present

## 2024-01-08 NOTE — Telephone Encounter (Signed)
 LMOVM to return call.

## 2024-01-09 DIAGNOSIS — I10 Essential (primary) hypertension: Secondary | ICD-10-CM | POA: Diagnosis not present

## 2024-01-09 DIAGNOSIS — J449 Chronic obstructive pulmonary disease, unspecified: Secondary | ICD-10-CM | POA: Diagnosis not present

## 2024-01-09 DIAGNOSIS — E782 Mixed hyperlipidemia: Secondary | ICD-10-CM | POA: Diagnosis not present

## 2024-01-09 DIAGNOSIS — E1165 Type 2 diabetes mellitus with hyperglycemia: Secondary | ICD-10-CM | POA: Diagnosis not present

## 2024-01-10 DIAGNOSIS — J441 Chronic obstructive pulmonary disease with (acute) exacerbation: Secondary | ICD-10-CM | POA: Diagnosis not present

## 2024-01-10 DIAGNOSIS — I110 Hypertensive heart disease with heart failure: Secondary | ICD-10-CM | POA: Diagnosis not present

## 2024-01-10 DIAGNOSIS — Z9181 History of falling: Secondary | ICD-10-CM | POA: Diagnosis not present

## 2024-01-10 DIAGNOSIS — F32A Depression, unspecified: Secondary | ICD-10-CM | POA: Diagnosis not present

## 2024-01-10 DIAGNOSIS — I5032 Chronic diastolic (congestive) heart failure: Secondary | ICD-10-CM | POA: Diagnosis not present

## 2024-01-10 DIAGNOSIS — J9611 Chronic respiratory failure with hypoxia: Secondary | ICD-10-CM | POA: Diagnosis not present

## 2024-01-10 DIAGNOSIS — E039 Hypothyroidism, unspecified: Secondary | ICD-10-CM | POA: Diagnosis not present

## 2024-01-10 DIAGNOSIS — I4819 Other persistent atrial fibrillation: Secondary | ICD-10-CM | POA: Diagnosis not present

## 2024-01-10 DIAGNOSIS — Z9981 Dependence on supplemental oxygen: Secondary | ICD-10-CM | POA: Diagnosis not present

## 2024-01-12 ENCOUNTER — Encounter: Payer: Self-pay | Admitting: *Deleted

## 2024-01-12 ENCOUNTER — Encounter: Payer: Self-pay | Admitting: Radiology

## 2024-01-12 DIAGNOSIS — Z885 Allergy status to narcotic agent status: Secondary | ICD-10-CM | POA: Diagnosis not present

## 2024-01-12 DIAGNOSIS — R531 Weakness: Secondary | ICD-10-CM | POA: Diagnosis not present

## 2024-01-12 DIAGNOSIS — R5383 Other fatigue: Secondary | ICD-10-CM | POA: Diagnosis not present

## 2024-01-12 DIAGNOSIS — R197 Diarrhea, unspecified: Secondary | ICD-10-CM | POA: Diagnosis not present

## 2024-01-12 DIAGNOSIS — R918 Other nonspecific abnormal finding of lung field: Secondary | ICD-10-CM | POA: Diagnosis not present

## 2024-01-12 DIAGNOSIS — J441 Chronic obstructive pulmonary disease with (acute) exacerbation: Secondary | ICD-10-CM | POA: Diagnosis not present

## 2024-01-12 DIAGNOSIS — I10 Essential (primary) hypertension: Secondary | ICD-10-CM | POA: Diagnosis not present

## 2024-01-12 DIAGNOSIS — Z880 Allergy status to penicillin: Secondary | ICD-10-CM | POA: Diagnosis not present

## 2024-01-12 DIAGNOSIS — Z882 Allergy status to sulfonamides status: Secondary | ICD-10-CM | POA: Diagnosis not present

## 2024-01-12 DIAGNOSIS — R0600 Dyspnea, unspecified: Secondary | ICD-10-CM | POA: Diagnosis not present

## 2024-01-12 DIAGNOSIS — E785 Hyperlipidemia, unspecified: Secondary | ICD-10-CM | POA: Diagnosis not present

## 2024-01-12 DIAGNOSIS — Z79899 Other long term (current) drug therapy: Secondary | ICD-10-CM | POA: Diagnosis not present

## 2024-01-12 DIAGNOSIS — K529 Noninfective gastroenteritis and colitis, unspecified: Secondary | ICD-10-CM | POA: Diagnosis not present

## 2024-01-12 NOTE — Telephone Encounter (Signed)
 Pt has been scheduled for 01/28/24. Instructions faxed to [facility] in [place].

## 2024-01-12 NOTE — Telephone Encounter (Signed)
 LMOVM to return call.

## 2024-01-13 DIAGNOSIS — J441 Chronic obstructive pulmonary disease with (acute) exacerbation: Secondary | ICD-10-CM | POA: Diagnosis not present

## 2024-01-13 DIAGNOSIS — I110 Hypertensive heart disease with heart failure: Secondary | ICD-10-CM | POA: Diagnosis not present

## 2024-01-13 DIAGNOSIS — E039 Hypothyroidism, unspecified: Secondary | ICD-10-CM | POA: Diagnosis not present

## 2024-01-13 DIAGNOSIS — I5032 Chronic diastolic (congestive) heart failure: Secondary | ICD-10-CM | POA: Diagnosis not present

## 2024-01-13 DIAGNOSIS — Z9181 History of falling: Secondary | ICD-10-CM | POA: Diagnosis not present

## 2024-01-13 DIAGNOSIS — I4819 Other persistent atrial fibrillation: Secondary | ICD-10-CM | POA: Diagnosis not present

## 2024-01-13 DIAGNOSIS — J9611 Chronic respiratory failure with hypoxia: Secondary | ICD-10-CM | POA: Diagnosis not present

## 2024-01-13 DIAGNOSIS — Z9981 Dependence on supplemental oxygen: Secondary | ICD-10-CM | POA: Diagnosis not present

## 2024-01-13 DIAGNOSIS — F32A Depression, unspecified: Secondary | ICD-10-CM | POA: Diagnosis not present

## 2024-01-13 DIAGNOSIS — F419 Anxiety disorder, unspecified: Secondary | ICD-10-CM | POA: Diagnosis not present

## 2024-01-14 DIAGNOSIS — F419 Anxiety disorder, unspecified: Secondary | ICD-10-CM | POA: Diagnosis not present

## 2024-01-14 DIAGNOSIS — I5032 Chronic diastolic (congestive) heart failure: Secondary | ICD-10-CM | POA: Diagnosis not present

## 2024-01-14 DIAGNOSIS — Z9981 Dependence on supplemental oxygen: Secondary | ICD-10-CM | POA: Diagnosis not present

## 2024-01-14 DIAGNOSIS — F331 Major depressive disorder, recurrent, moderate: Secondary | ICD-10-CM | POA: Diagnosis not present

## 2024-01-14 DIAGNOSIS — I10 Essential (primary) hypertension: Secondary | ICD-10-CM | POA: Diagnosis not present

## 2024-01-14 DIAGNOSIS — R0602 Shortness of breath: Secondary | ICD-10-CM | POA: Diagnosis not present

## 2024-01-14 DIAGNOSIS — I110 Hypertensive heart disease with heart failure: Secondary | ICD-10-CM | POA: Diagnosis not present

## 2024-01-14 DIAGNOSIS — J441 Chronic obstructive pulmonary disease with (acute) exacerbation: Secondary | ICD-10-CM | POA: Diagnosis not present

## 2024-01-14 DIAGNOSIS — I420 Dilated cardiomyopathy: Secondary | ICD-10-CM | POA: Diagnosis not present

## 2024-01-14 DIAGNOSIS — I4819 Other persistent atrial fibrillation: Secondary | ICD-10-CM | POA: Diagnosis not present

## 2024-01-14 DIAGNOSIS — F32A Depression, unspecified: Secondary | ICD-10-CM | POA: Diagnosis not present

## 2024-01-14 DIAGNOSIS — I739 Peripheral vascular disease, unspecified: Secondary | ICD-10-CM | POA: Diagnosis not present

## 2024-01-14 DIAGNOSIS — R195 Other fecal abnormalities: Secondary | ICD-10-CM | POA: Diagnosis not present

## 2024-01-14 DIAGNOSIS — J9611 Chronic respiratory failure with hypoxia: Secondary | ICD-10-CM | POA: Diagnosis not present

## 2024-01-14 DIAGNOSIS — R4189 Other symptoms and signs involving cognitive functions and awareness: Secondary | ICD-10-CM | POA: Diagnosis not present

## 2024-01-14 DIAGNOSIS — E039 Hypothyroidism, unspecified: Secondary | ICD-10-CM | POA: Diagnosis not present

## 2024-01-14 DIAGNOSIS — D509 Iron deficiency anemia, unspecified: Secondary | ICD-10-CM | POA: Diagnosis not present

## 2024-01-14 DIAGNOSIS — Z9181 History of falling: Secondary | ICD-10-CM | POA: Diagnosis not present

## 2024-01-14 DIAGNOSIS — I482 Chronic atrial fibrillation, unspecified: Secondary | ICD-10-CM | POA: Diagnosis not present

## 2024-01-14 DIAGNOSIS — K219 Gastro-esophageal reflux disease without esophagitis: Secondary | ICD-10-CM | POA: Diagnosis not present

## 2024-01-15 DIAGNOSIS — I5032 Chronic diastolic (congestive) heart failure: Secondary | ICD-10-CM | POA: Diagnosis not present

## 2024-01-15 DIAGNOSIS — R630 Anorexia: Secondary | ICD-10-CM | POA: Diagnosis not present

## 2024-01-15 DIAGNOSIS — Z9981 Dependence on supplemental oxygen: Secondary | ICD-10-CM | POA: Diagnosis not present

## 2024-01-15 DIAGNOSIS — J441 Chronic obstructive pulmonary disease with (acute) exacerbation: Secondary | ICD-10-CM | POA: Diagnosis not present

## 2024-01-15 DIAGNOSIS — A084 Viral intestinal infection, unspecified: Secondary | ICD-10-CM | POA: Diagnosis not present

## 2024-01-15 DIAGNOSIS — R197 Diarrhea, unspecified: Secondary | ICD-10-CM | POA: Diagnosis not present

## 2024-01-15 DIAGNOSIS — Z9181 History of falling: Secondary | ICD-10-CM | POA: Diagnosis not present

## 2024-01-15 DIAGNOSIS — I4819 Other persistent atrial fibrillation: Secondary | ICD-10-CM | POA: Diagnosis not present

## 2024-01-15 DIAGNOSIS — F32A Depression, unspecified: Secondary | ICD-10-CM | POA: Diagnosis not present

## 2024-01-15 DIAGNOSIS — J449 Chronic obstructive pulmonary disease, unspecified: Secondary | ICD-10-CM | POA: Diagnosis not present

## 2024-01-15 DIAGNOSIS — I110 Hypertensive heart disease with heart failure: Secondary | ICD-10-CM | POA: Diagnosis not present

## 2024-01-15 DIAGNOSIS — E039 Hypothyroidism, unspecified: Secondary | ICD-10-CM | POA: Diagnosis not present

## 2024-01-15 DIAGNOSIS — F419 Anxiety disorder, unspecified: Secondary | ICD-10-CM | POA: Diagnosis not present

## 2024-01-18 DIAGNOSIS — R609 Edema, unspecified: Secondary | ICD-10-CM | POA: Diagnosis not present

## 2024-01-18 DIAGNOSIS — Z885 Allergy status to narcotic agent status: Secondary | ICD-10-CM | POA: Diagnosis not present

## 2024-01-18 DIAGNOSIS — Z66 Do not resuscitate: Secondary | ICD-10-CM | POA: Diagnosis not present

## 2024-01-18 DIAGNOSIS — Z880 Allergy status to penicillin: Secondary | ICD-10-CM | POA: Diagnosis not present

## 2024-01-18 DIAGNOSIS — F32A Depression, unspecified: Secondary | ICD-10-CM | POA: Diagnosis not present

## 2024-01-18 DIAGNOSIS — J441 Chronic obstructive pulmonary disease with (acute) exacerbation: Secondary | ICD-10-CM | POA: Diagnosis not present

## 2024-01-18 DIAGNOSIS — R54 Age-related physical debility: Secondary | ICD-10-CM | POA: Diagnosis not present

## 2024-01-18 DIAGNOSIS — I480 Paroxysmal atrial fibrillation: Secondary | ICD-10-CM | POA: Diagnosis not present

## 2024-01-18 DIAGNOSIS — Z79899 Other long term (current) drug therapy: Secondary | ICD-10-CM | POA: Diagnosis not present

## 2024-01-18 DIAGNOSIS — Z792 Long term (current) use of antibiotics: Secondary | ICD-10-CM | POA: Diagnosis not present

## 2024-01-18 DIAGNOSIS — D62 Acute posthemorrhagic anemia: Secondary | ICD-10-CM | POA: Diagnosis not present

## 2024-01-18 DIAGNOSIS — I110 Hypertensive heart disease with heart failure: Secondary | ICD-10-CM | POA: Diagnosis not present

## 2024-01-18 DIAGNOSIS — J449 Chronic obstructive pulmonary disease, unspecified: Secondary | ICD-10-CM | POA: Diagnosis not present

## 2024-01-18 DIAGNOSIS — F419 Anxiety disorder, unspecified: Secondary | ICD-10-CM | POA: Diagnosis not present

## 2024-01-18 DIAGNOSIS — K219 Gastro-esophageal reflux disease without esophagitis: Secondary | ICD-10-CM | POA: Diagnosis not present

## 2024-01-18 DIAGNOSIS — E8770 Fluid overload, unspecified: Secondary | ICD-10-CM | POA: Diagnosis not present

## 2024-01-18 DIAGNOSIS — I452 Bifascicular block: Secondary | ICD-10-CM | POA: Diagnosis not present

## 2024-01-18 DIAGNOSIS — Z9181 History of falling: Secondary | ICD-10-CM | POA: Diagnosis not present

## 2024-01-18 DIAGNOSIS — Z882 Allergy status to sulfonamides status: Secondary | ICD-10-CM | POA: Diagnosis not present

## 2024-01-18 DIAGNOSIS — E039 Hypothyroidism, unspecified: Secondary | ICD-10-CM | POA: Diagnosis not present

## 2024-01-18 DIAGNOSIS — D649 Anemia, unspecified: Secondary | ICD-10-CM | POA: Diagnosis not present

## 2024-01-18 DIAGNOSIS — Z79891 Long term (current) use of opiate analgesic: Secondary | ICD-10-CM | POA: Diagnosis not present

## 2024-01-18 DIAGNOSIS — I340 Nonrheumatic mitral (valve) insufficiency: Secondary | ICD-10-CM | POA: Diagnosis not present

## 2024-01-18 DIAGNOSIS — Z87891 Personal history of nicotine dependence: Secondary | ICD-10-CM | POA: Diagnosis not present

## 2024-01-18 DIAGNOSIS — I5043 Acute on chronic combined systolic (congestive) and diastolic (congestive) heart failure: Secondary | ICD-10-CM | POA: Diagnosis not present

## 2024-01-18 DIAGNOSIS — R0602 Shortness of breath: Secondary | ICD-10-CM | POA: Diagnosis not present

## 2024-01-18 DIAGNOSIS — I639 Cerebral infarction, unspecified: Secondary | ICD-10-CM | POA: Diagnosis not present

## 2024-01-18 DIAGNOSIS — Z789 Other specified health status: Secondary | ICD-10-CM | POA: Diagnosis not present

## 2024-01-18 DIAGNOSIS — I4891 Unspecified atrial fibrillation: Secondary | ICD-10-CM | POA: Diagnosis not present

## 2024-01-18 DIAGNOSIS — I509 Heart failure, unspecified: Secondary | ICD-10-CM | POA: Diagnosis not present

## 2024-01-18 DIAGNOSIS — J9611 Chronic respiratory failure with hypoxia: Secondary | ICD-10-CM | POA: Diagnosis not present

## 2024-01-18 DIAGNOSIS — E785 Hyperlipidemia, unspecified: Secondary | ICD-10-CM | POA: Diagnosis not present

## 2024-01-18 DIAGNOSIS — I5033 Acute on chronic diastolic (congestive) heart failure: Secondary | ICD-10-CM | POA: Diagnosis not present

## 2024-01-18 DIAGNOSIS — Z79890 Hormone replacement therapy (postmenopausal): Secondary | ICD-10-CM | POA: Diagnosis not present

## 2024-01-18 DIAGNOSIS — J90 Pleural effusion, not elsewhere classified: Secondary | ICD-10-CM | POA: Diagnosis not present
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
## 2024-01-19 DIAGNOSIS — E039 Hypothyroidism, unspecified: Secondary | ICD-10-CM | POA: Diagnosis not present

## 2024-01-19 DIAGNOSIS — D649 Anemia, unspecified: Secondary | ICD-10-CM | POA: Diagnosis not present

## 2024-01-19 DIAGNOSIS — I110 Hypertensive heart disease with heart failure: Secondary | ICD-10-CM | POA: Diagnosis not present

## 2024-01-19 DIAGNOSIS — I5033 Acute on chronic diastolic (congestive) heart failure: Secondary | ICD-10-CM | POA: Diagnosis not present

## 2024-01-19 DIAGNOSIS — E785 Hyperlipidemia, unspecified: Secondary | ICD-10-CM | POA: Diagnosis not present

## 2024-01-19 DIAGNOSIS — J449 Chronic obstructive pulmonary disease, unspecified: Secondary | ICD-10-CM | POA: Diagnosis not present

## 2024-01-19 DIAGNOSIS — K219 Gastro-esophageal reflux disease without esophagitis: Secondary | ICD-10-CM | POA: Diagnosis not present

## 2024-01-19 DIAGNOSIS — F32A Depression, unspecified: Secondary | ICD-10-CM | POA: Diagnosis not present

## 2024-01-19 DIAGNOSIS — E8770 Fluid overload, unspecified: Secondary | ICD-10-CM | POA: Diagnosis not present

## 2024-01-19 DIAGNOSIS — I480 Paroxysmal atrial fibrillation: Secondary | ICD-10-CM | POA: Diagnosis not present

## 2024-01-19 DIAGNOSIS — F419 Anxiety disorder, unspecified: Secondary | ICD-10-CM | POA: Diagnosis not present

## 2024-01-19 DIAGNOSIS — I639 Cerebral infarction, unspecified: Secondary | ICD-10-CM | POA: Diagnosis not present

## 2024-01-19 NOTE — Telephone Encounter (Signed)
 Patient's relative Dickey calling to state patient is in the hospital at [facility] and will not be available for phone visit today. Would like a call back to reschedule phone visit for a later time.   Visit is scheduled for today 11/10 at 2:20pm  [phone]

## 2024-01-19 NOTE — Telephone Encounter (Signed)
 Caregiver aware that order has been sent over to [facility]. NFN

## 2024-01-20 DIAGNOSIS — E785 Hyperlipidemia, unspecified: Secondary | ICD-10-CM | POA: Diagnosis not present

## 2024-01-20 DIAGNOSIS — J449 Chronic obstructive pulmonary disease, unspecified: Secondary | ICD-10-CM | POA: Diagnosis not present

## 2024-01-20 DIAGNOSIS — I639 Cerebral infarction, unspecified: Secondary | ICD-10-CM | POA: Diagnosis not present

## 2024-01-20 DIAGNOSIS — F419 Anxiety disorder, unspecified: Secondary | ICD-10-CM | POA: Diagnosis not present

## 2024-01-20 DIAGNOSIS — I480 Paroxysmal atrial fibrillation: Secondary | ICD-10-CM | POA: Diagnosis not present

## 2024-01-20 DIAGNOSIS — I110 Hypertensive heart disease with heart failure: Secondary | ICD-10-CM | POA: Diagnosis not present

## 2024-01-20 DIAGNOSIS — Z79890 Hormone replacement therapy (postmenopausal): Secondary | ICD-10-CM | POA: Diagnosis not present

## 2024-01-20 DIAGNOSIS — F32A Depression, unspecified: Secondary | ICD-10-CM | POA: Diagnosis not present

## 2024-01-20 DIAGNOSIS — K219 Gastro-esophageal reflux disease without esophagitis: Secondary | ICD-10-CM | POA: Diagnosis not present

## 2024-01-20 DIAGNOSIS — I5033 Acute on chronic diastolic (congestive) heart failure: Secondary | ICD-10-CM | POA: Diagnosis not present

## 2024-01-20 DIAGNOSIS — E039 Hypothyroidism, unspecified: Secondary | ICD-10-CM | POA: Diagnosis not present

## 2024-01-20 DIAGNOSIS — D649 Anemia, unspecified: Secondary | ICD-10-CM | POA: Diagnosis not present

## 2024-01-20 NOTE — Progress Notes (Signed)
 ------------------------------------------------------------------------------- Attestation signed by Feliz Margart Fallow, DO at 01/20/24 2002 I was the supervising physician during time of service.  Case was discussed with APP and I agree with management as outlined below. -------------------------------------------------------------------------------   PROGRESS NOTE [facility] RAYNALDO 01/20/24    Patient name: Amanda Palmer DOB 10-07-1933 MRN#: [id number] PCP: Shona Norleen Folk, MD Time: 3:07 PM Primary Care Provider:  Shona Norleen Folk, MD Inpatient primary attending provider: Brutus Franco Shank, [facility] Course: 01/18/24 Admitted from [facility] for hypervolemia, acute on chronic HF with preserved EF.  IV diuresis, oxygen  support.  Wears nighttime oxygen  only at home.    _____________________________________  Admission HPI  Patient admitted on: 01/18/2024 12:31 AM  Patient admitted by: Thersia Roger, D.O.   CHIEF COMPLAINT:  SOB   Day of admission HPI:  Amanda Palmer  is a 89 y.o. female from [place] with a PMH significant for anxiety/depression, afib (currently off Eliquis ), HFpEF, COPD on nocturnal O2, gastritis, duodenitis, GERD, DDD, history of breast cancer, HTN, history of CVA, HLD, pseudophakia, vitamin D deficiency,   who presented with SOB and LE edema. She usually only needs O2 nocturnally but felt she needed to use it during the day yesterday.  She had a recent hospitalization 10/25 for the same, says that she is compliant with her medications as [facility] provides her meds for her.     She was also seen in the ER on 11/3 for gastroenteritis and COPD symptoms, and was discharged on doxy.    LVEF >55% on echo 10/25   Otherwise there has been no change in status. Patient has been taking medication as prescribed and there has been no recent change in medication or diet. There has been no recent illness/hospitalizations, travel or sick  contacts.  Patient denies fevers/chills, weakness, dizziness, chest pain, N/V/C/D, abdominal pain, dysuria/frequency,   In the ED the patient received aspirin , Lasix , Duoneb, nitropaste.   Medical admission was requested for further workup and management of CHF exac.     Patient admitted on Home O2? - Yes, nocturnal only Patient on home anticoagulant? -  No Patient admitted with Chronic home foley catheter? - No Foley catheter placed or replaced by another service prior to admission? - No Central Line Status: None   Mental Status on Admission: The patient IS Alert and oriented to PERSON The patient IS  Alert And oriented to TIME The patient IS Alert and oriented to LOCATION   Today; patient is sitting up in bed awake and alert.  States she feels very weak and tired today.     Problem List, Assessment & Plan     ASSESSMENT & PLAN (In order of descending acuity)   89 y.o. female with a PMH significant for aanxiety/depression, afib (currently off Eliquis ), HFpEF, COPD on nocturnal O2, gastritis, duodenitis, GERD, DDD, history of breast cancer, HTN, history of CVA, HLD, pseudophakia, vitamin D deficiency,  now admitted with:   Acute on chronic HFpEF, hypervolemia Dyspnea, lower extremity edema, patient needs diuresis with IV Lasix  BNP 3298, trop 9, 11.  No further troponins unless patient develops chest pain. CXR: interval progressive findings of moderate to severe cardiac failure/hypervolemia  Last EF 10/25 >55% Will not repeat echo due to recent exam Strict intake and output, patient on the pathway, fluid restriction Continue valsartan, metoprolol  Requiring consistent oxygen  support rather than nighttime only; 1-2L Discharge on daily 20mg  Lasix    History of COPD on nocturnal O2, recent exacerbation Continue O2 and nebs on  schedule Continue doxy through 11/13, changed to IV due to p.o. making her nauseated   Paroxysmal atrial fibrillation Continue amidarone, metoprolol  Off  Eliquis  2/2 pending workup for GI bleed (scheduled for 11/19) Rate 50s and 60s, well-controlled   History of HTN Continue metoprolol , valsartan   History of HLD Continue atorvastatin    History of CVA Continue aspirin , atorvastatin    History of GERD / gastritis with normocytic anemia Continue PPI, ferrous sulfate and monitor hemoglobin Continue to hold Eliquis ; after discussion with patient and her niece and shared decision making during her last admission it was decided to hold anticoagulation due to melena and suspected active GI bleed until she has further conversation with her PCP Hemoglobin 6.9 today, transfuse one unit, stopped AC   History of hypothyroidism Continue levothyroxine   History of anxiety/depression Continue sertraline Patient is calm and pleasant   Anticoagulation; stopped Oxygen ; 1 L, baseline is 2L at night IV fluids; none IV antibiotics; doxycycline  CODE STATUS; DNR/DNI   Continue current treatment plan with IV diuresis of this patient with recurrent hypervolemia on baseline oxygen  who is from local assisted living facility.  Patient also getting antibiotics as well as oxygen  support.  Monitoring her hemoglobin as she has history of anemia with melena and is scheduled for endoscopy workup 11/19; getting a unit of blood today.  Discussed the treatment plan with Dr. Feliz attending. ___________________________________________   ADDITIONAL NON-ACUTE FINDINGS, OBSERVATIONS, FAMILY DISCUSSIONS, ETC. (When present):   General; ill appearing 89 year old female Cardiovascular; regular rhythm, rate 60s Pulmonary; lungs with scattered crackles bilaterally, normal breathing effort, on 1L of oxygen  Abdomen; soft, nontender, nondistended Extremities; no edema, moves all extremities spontaneously Neuro; alert, oriented X 3, no focal deficit   Incidental Findings for outpatient Follow-Up: No significant incidental findings present    DVT prevention; stopped, SCDs  only _____________________________________  Temp:  [36.8 C (98.2 F)-37.3 C (99.1 F)] 36.9 C (98.4 F) Pulse:  [60-90] 66 Resp:  [14-18] 18 BP: (128-159)/(40-44) 159/44 FiO2 (%):  [24 %] 24 % SpO2:  [95 %-100 %] 99 % Body mass index is 29.74 kg/m. Intake/Output last 3 shifts: I/O last 3 completed shifts: In: 696 [P.O.:696] Out: 1450 [Urine:1450]  Consults Requested  IP CONSULT TO CARDIOLOGY     In hospital Nutrition: Nutrition Therapy Heart Healthy; Fluid 1800 ml    An advanced care planning discussion was had with patient and/or patient's decisions maker (documented separately).  CODE STATUS :                    DNR and DNI   Discharge estimated within 1-2 days. Anticipated disposition:  To Assisted Living Facility ______________________________________________________________  Current Medications[1] ________________________________________________________________  Allergies  Allergen Reactions  . Morphine  Nausea And Vomiting    Other Reaction(s): GI Intolerance  . Penicillins Rash    (Penicillins)   . Atenolol     (Atenolol) EDEMA, SOB  . Nalbuphine     (Nubain *ANALGESICS - OPIOID*)  . Opioids - Morphine  Analogues Nausea And Vomiting  . Penicillin Rash  . Penicillins Rash  . Prednisone  Anxiety, Nausea And Vomiting and Other (See Comments)    Makes patient not in right state of mind    Other Reaction(s): GI Intolerance  Makes patient not in right state of mind    Other Reaction(s): GI Intolerance    Makes patient not in right state of mind  Other Reaction(s): GI Intolerance  . Sulfamethoxazole Nausea And Vomiting    Other Reaction(s): GI  Intolerance     Past Medical History[2]  Past Surgical History[3]   Family History[4]      Imaging  ECG 12 Lead Result Date: 11/9/202 Wide QRS rhythm Right bundle branch block Left anterior fascicular block ** Bifascicular block ** Abnormal ECG When compared with ECG of 01-Jan-2024 05:45, Wide QRS rhythm  has replaced Sinus rhythm  XR Chest Portable Result Date: 01/18/2024 Exam:  Portable Chest  History:  Shortness of breath    Technique: Single frontal view of the chest.  Comparison:  01/12/2024    Findings:  Progressive moderate to severe interstitial edema is demonstrated since 01/12/2024. Minimal right pleural effusion is increased. Moderate cardiomegaly is also slightly increased.        Interval progressive findings of moderate to severe cardiac failure/hypervolemia      Signed (Electronic Signature): 01/18/2024 1:56 AM Signed By: Luke Batter   Lab Results   Recent Labs    01/20/24 0347  WBC 4.8  HGB 6.9*  HCT 21.4*  PLT 228   Recent Labs    01/18/24 0042 01/19/24 0353 01/20/24 0347  NA 136 139 140  K 4.3 3.7 3.9  CL 101 100 103  CO2 26.1 32.7* 31.8  BUN 15 15 17   CREATININE 0.98 1.12* 1.16*  GLU 116 79 99  CALCIUM  8.9 8.5 8.6  ALBUMIN 2.8*  --   --   PROT 6.7  --   --   BILITOT 0.6  --   --   AST 19  --   --   ALT 18  --   --   ALKPHOS 91  --   --   MG 2.1 1.8 1.9  TSH  --  2.368  --    Recent Labs    01/18/24 0042 01/18/24 0820  TROPONINI 9 11  INR 1.09  --    No results for input(s): WBCUA, NITRITE, LEUKOCYTESUR, BACTERIA, RBCUA, BLOODU, GLUCOSEU, PROTEINUA, KETONESU, KETUR in the last 72 hours. No results for input(s): OPIAU, BENZU, TRICYCLIC, PCPU, AMPHU, COCAU, CANNAU, BARBU, ETOH, ACETAMIN, SALICYLATE in the last 72 hours. No results for input(s): PREGTESTUR, PREGPOC in the last 72 hours. Recent Labs    01/19/24 0353  CHOL 129  LDL 47  HDL 75  TRIG 48   Recent Labs    01/18/24 0056  PHART 7.43  PCO2ART 39.9  PO2ART 86  HCO3ART 26.5*  O2SATART 97.7  BEART 2.0     Brutus FORBES Shank, FNP Hospitalist, [facility] 01/20/24, 3:07 PM      [1]  Current Facility-Administered Medications:  .  acetaminophen  (TYLENOL ) tablet 650 mg, 650 mg, Oral, Q4H PRN, Hugelmeyer, Alexis, DO .  aluminum-magnesium   hydroxide-simethicone  (MAALOX MAX) 80-80-8 mg/mL oral suspension, 30 mL, Oral, Q4H PRN, Hugelmeyer, Alexis, DO .  amiodarone  (PACERONE ) tablet 200 mg, 200 mg, Oral, Daily, Hugelmeyer, Alexis, DO, 200 mg at 01/20/24 0832 .  atorvastatin  (LIPITOR) tablet 20 mg, 20 mg, Oral, Nightly, Hugelmeyer, Alexis, DO, 20 mg at 01/19/24 2037 .  bisacodyl (DULCOLAX) EC tablet 10 mg, 10 mg, Oral, Daily PRN, Hugelmeyer, Alexis, DO .  busPIRone (BUSPAR) tablet 5 mg, 5 mg, Oral, BID, Hugelmeyer, Alexis, DO, 5 mg at 01/20/24 9167 .  cyanocobalamin tablet 1,000 mcg, 1,000 mcg, Oral, Daily, Hugelmeyer, Alexis, DO, 1,000 mcg at 01/20/24 9167 .  doxycycline  (VIBRA -TABS) tablet 100 mg, 100 mg, Oral, BID, Pace, Hagan Eggleston, FNP .  [Provider Hold] enoxaparin  (LOVENOX ) syringe 40 mg, 40 mg, Subcutaneous, Q24H, Hugelmeyer, Alexis, DO, 40 mg at 01/18/24 0826 .  [  START ON 01/21/2024] famotidine  (PEPCID ) tablet 10 mg, 10 mg, Oral, Daily, Pace, Hagan Eggleston, FNP .  ferrous sulfate tablet 325 mg, 325 mg, Oral, Daily, Pace, Hagan Eggleston, FNP, 325 mg at 01/20/24 9167 .  furosemide  (LASIX ) injection 20 mg, 20 mg, Intravenous, Once, Pace, Brutus Bryant, FNP .  furosemide  (LASIX ) tablet 20 mg, 20 mg, Oral, Daily, Pace, Hagan Eggleston, FNP, 20 mg at 01/20/24 9167 .  guaiFENesin  (MUCINEX ) 12 hr tablet 600 mg, 600 mg, Oral, BID, Pace, Hagan Eggleston, FNP, 600 mg at 01/20/24 9167 .  guaiFENesin  (ROBITUSSIN) oral syrup, 200 mg, Oral, Q4H PRN, Hugelmeyer, Alexis, DO, 200 mg at 01/18/24 1741 .  HYDROcodone -acetaminophen  (NORCO) 5-325 mg per tablet 1 tablet, 1 tablet, Oral, Q6H PRN, Hugelmeyer, Alexis, DO .  ipratropium-albuterol  (DUO-NEB) 0.5-2.5 mg/3 mL nebulizer solution 3 mL, 3 mL, Nebulization, Q6H (RT), Pace, Brutus Bryant, FNP, 3 mL at 01/20/24 1000 .  levalbuterol  (XOPENEX ) nebulizer solution 0.63 mg, 1 ampule, Nebulization, Q6H PRN, Hugelmeyer, Alexis, DO .  levothyroxine (SYNTHROID) tablet 75 mcg, 75 mcg, Oral, Daily,  Hugelmeyer, Alexis, DO, 75 mcg at 01/20/24 0626 .  magnesium  oxide (MAG-OX) tablet 400 mg, 400 mg, Oral, Daily, Pace, Hagan Eggleston, FNP, 400 mg at 01/20/24 9167 .  melatonin tablet 3 mg, 3 mg, Oral, Nightly PRN, Hugelmeyer, Alexis, DO .  metoPROLOL  tartrate (Lopressor ) tablet 12.5 mg, 12.5 mg, Oral, BID, Hugelmeyer, Alexis, DO, 12.5 mg at 01/20/24 0832 .  mirtazapine (REMERON) tablet 7.5 mg, 7.5 mg, Oral, Nightly, Hugelmeyer, Alexis, DO, 7.5 mg at 01/19/24 2037 .  naloxone (NARCAN) injection 0.1 mg, 0.1 mg, Intravenous, Q5 Min PRN, Hugelmeyer, Alexis, DO .  ondansetron  (ZOFRAN -ODT) disintegrating tablet 4 mg, 4 mg, Oral, Q8H PRN, 4 mg at 01/18/24 1814 **OR** ondansetron  (ZOFRAN -ODT) disintegrating tablet 8 mg, 8 mg, Oral, Q8H PRN, Hugelmeyer, Alexis, DO .  pantoprazole  (Protonix ) EC tablet 40 mg, 40 mg, Oral, BID AC, Pace, Hagan Eggleston, FNP, 40 mg at 01/20/24 9167 .  prochlorperazine (COMPAZINE) injection 2.5 mg, 2.5 mg, Intravenous, Q8H PRN **OR** prochlorperazine (COMPAZINE) injection 5 mg, 5 mg, Intravenous, Q8H PRN, Hugelmeyer, Alexis, DO .  senna-docusate (PERICOLACE) 8.6-50 mg 1 tablet, 1 tablet, Oral, Daily, Hugelmeyer, Alexis, DO, 1 tablet at 01/20/24 9167 .  sertraline (ZOLOFT) tablet 75 mg, 75 mg, Oral, Daily, Hugelmeyer, Alexis, DO, 75 mg at 01/20/24 0832 .  thiamine mononitrate (vit B1) tablet 100 mg, 100 mg, Oral, Daily, Pace, Hagan Eggleston, FNP, 100 mg at 01/20/24 9167 .  traMADol  (ULTRAM ) tablet 50 mg, 50 mg, Oral, Q12H PRN, Hugelmeyer, Alexis, DO .  valsartan (DIOVAN) tablet 160 mg, 160 mg, Oral, Daily, Hugelmeyer, Alexis, DO, 160 mg at 01/20/24 9167 [2] Past Medical History: Diagnosis Date  . Anxiety   . Cancer    (CMS-HCC)   . COPD (chronic obstructive pulmonary disease) (CMS-HCC)   . Depression   . Fractures   . Hyperlipidemia   . Hypertension   . Joint pain   . Shoulder injury   [3] Past Surgical History: Procedure Laterality Date  . BACK SURGERY    . HIP  SURGERY    . JOINT REPLACEMENT     right hip, both knees   . KNEE SURGERY     TKA x both knees   . SHOULDER SURGERY     left rotator cuff repair   [4] History reviewed. No pertinent family history.

## 2024-01-21 NOTE — Nursing Note (Signed)
   01/21/24 1142  Rapid Rounds  Attendance Nursing;Physician;Advanced Practice Provider (APP);Case Manager;Dietitian/Nutrition Services;Occupational Therapy;Pharmacy;Physical Therapy;Respiratory Therapy;Social Work/Services;Speech Language Pathology  Expected Discharge Disposition ALF [facility])  Today we still await: Clinical stability   Will discharge back to [facility] today. Resume HH orders placed. Patient already active with [facility] for palliative. Will need O2 usage checked for increase in need.

## 2024-01-21 NOTE — Unmapped External Note (Signed)
 Clarification  I have reviewed [name]'s chart and the following accurately represents the acuity of Amanda Palmer's blood condition.   Acute blood loss anemia  Additional Information: likely acute blood loss anemia, thought to be GI loss, workup pending

## 2024-01-21 NOTE — Nursing Note (Signed)
 SW and NP Brutus discussed pt's discharge today back to [place]. Pt does have oxygen . Will need transport arranged and if [facility] picks up pt, will need to bring pt's oxygen . Pt's discharge summary has been completed. SW spoke with SW [place] and provided updates and will prepare pt for discharge back to [place].

## 2024-01-21 NOTE — Nursing Note (Signed)
   01/21/24 1346  Final Assessment  Patient's Post Acute Contact Information See demo  Has a specialist appointment been made? No  Post Acute Facility needed at discharge? No  Home Care/ Home Medical Equipment needed at discharge? Yes  Home Care/ Home Medical Equipment Home Health (specify)  Home Health Provider (Name/Phone #) [facility]  Outpatient/Community Referrals needed for discharge? No  Currently receiving outpatient dialysis? No  Discharge Disposition Assisted Living Facility ([facility])  Transportation Anticipated fleeta, wheelchair accessible  Quality data for continuing care services shared with patient and/or representative? Yes  Patient and/or family were provided with choice of facilities / services that are available and appropriate to meet post hospital care needs? N/A  Final Assessment Complete  Final Assessment Complete Yes

## 2024-01-21 NOTE — Nursing Note (Addendum)
 Discharged to [facility] in wheelchair. Ivs removed. Report given to kayla at [facility]

## 2024-01-21 NOTE — Nursing Note (Addendum)
 SW sent Amanda Palmer's discharge summary to Kayla at [facility] and asked if they are able to bring oxygen  and pick patient up this afternoon, waiting for reply.   Kayla stated the [facility] plans to pick Amanda Palmer up around 230 pm. SW notified 2nd floor staff of transport time.  SW attempted to notify patient's relative Dickey Fuller of discharge however her voicemail was full.

## 2024-01-21 NOTE — Care Plan (Signed)
 Shift Summary Nebulizer treatment was administered in response to diminished breath sounds and dyspnea.  Oxygen  therapy was maintained at 2 L/min via nasal cannula throughout the shift.  Cardiac rhythm alternated between normal sinus rhythm and sinus bradycardia, with MAP trending lower overnight.  Urine output was large and clear, with basic metabolic panel and CBC showing renal and hematologic abnormalities.  Independence with oral care and self-care was encouraged, though lower extremity mobility remained limited.   Optimal Gas Exchange: Oxygen  saturation remained above 96% throughout the shift while on 2 L/min nasal cannula, with diminished breath sounds and expiratory wheezes noted; dyspnea persisted with exertion and lying flat. Nebulizer treatment was administered during the shift.   Optimal Cardiac Output: Cardiac rhythm alternated between normal sinus rhythm and sinus bradycardia, with MAP trending lower overnight; heart sounds remained S1, S2 and no cardiac symptoms were documented.   Fluid and Electrolyte Balance: Urine output was large and clear with no odor, and basic metabolic panel showed elevated creatinine and low eGFR, while CBC revealed low hemoglobin and hematocrit.   Effective Oxygenation and Ventilation: Airway patency was maintained and oxygen  therapy continued via nasal cannula at 2 L/min; diminished breath sounds and dyspnea were present, and nebulizer treatment was given.   Improved Ability to Complete Activities of Daily Living: Independence with oral care and self-care was encouraged, with personal objects within reach; movement in lower extremities was limited, and mobility was slightly limited.

## 2024-01-22 ENCOUNTER — Telehealth: Payer: Self-pay

## 2024-01-22 NOTE — Telephone Encounter (Signed)
 Please obtain discharge summary from [facility] and scan under media

## 2024-01-22 NOTE — Care Plan (Signed)
 Transition of Care Encounter Data   Call attempt: 1 Admission date: 01/18/24 Discharge date: 01/21/24 Discharge diagnosis: Acute on chronic HFpEF, hypervolemia Patient post discharge: Medications:      SABRA   [facility]: [phone]:  .  [facility]: [phone]:  .  Other: Contact PCP:       [facility] TRANSITIONAL CASE MANAGEMENT SUMMARY NOTE   Attempted to contact patient today at Cell to complete Transitional Case Management call from [facility]. No answer/unable to leave message; 1st attempt.           Josette JINNY Lout, RN

## 2024-01-22 NOTE — Telephone Encounter (Signed)
-----   Message from Lamar Hollingshead sent at 01/22/2024  3:12 PM EST ----- Regarding: RE: Just dischared from hospital. Yes, I am interested.  Can we retrieve D/C summary and put in media ----- Message ----- From: Rhenda Luke GAILS, RN Sent: 01/22/2024   3:11 PM EST To: Mindy GORMAN Kitchens, CMA; Lamar CHRISTELLA Hollingshead, MD; # Subject: Just dischared from hospital.                  Good afternoon! I just wanted you to be aware that Amanda Palmer has been in [facility] for acute blood loss anemia. She was discharged yesterday. I thought maybe Dr Hollingshead would want to review this. Thank you!

## 2024-01-22 NOTE — Telephone Encounter (Addendum)
 Dr. Shaaron has already reviewed and stated Rourk, Lamar HERO, MD  Rhenda Luke GAILS, RN; Jeanell Graeme RAMAN, CMA; Gladis Palma A Thanks for the progress note.  I suspect she is not going to be ready for an EGD on 11/19.  I think the best approach here is to cancel her and get her back to the office first part of December toreassess.  If she is good to go we will put her back on the schedule Eliquis  will need to be stopped if okay with prescriber.       Called [facility] in [place] and is aware procedure is cancelled. Sending to front desk to schedule appointment.

## 2024-01-22 NOTE — Pre-Procedure Instructions (Signed)
 Dr Shaaron messaged to review chart since patient was just D/c from [facility] on yesterday,01/21/2024.

## 2024-01-23 ENCOUNTER — Encounter (HOSPITAL_COMMUNITY)
Admission: RE | Admit: 2024-01-23 | Discharge: 2024-01-23 | Disposition: A | Discharge status: Home / Self Care | Source: Ambulatory Visit | Attending: Internal Medicine | Admitting: Internal Medicine

## 2024-01-23 DIAGNOSIS — I420 Dilated cardiomyopathy: Secondary | ICD-10-CM | POA: Diagnosis not present

## 2024-01-23 DIAGNOSIS — R195 Other fecal abnormalities: Secondary | ICD-10-CM | POA: Diagnosis not present

## 2024-01-23 DIAGNOSIS — I482 Chronic atrial fibrillation, unspecified: Secondary | ICD-10-CM | POA: Diagnosis not present

## 2024-01-23 DIAGNOSIS — I110 Hypertensive heart disease with heart failure: Secondary | ICD-10-CM | POA: Diagnosis not present

## 2024-01-23 DIAGNOSIS — E039 Hypothyroidism, unspecified: Secondary | ICD-10-CM | POA: Diagnosis not present

## 2024-01-23 DIAGNOSIS — I739 Peripheral vascular disease, unspecified: Secondary | ICD-10-CM | POA: Diagnosis not present

## 2024-01-23 DIAGNOSIS — J9611 Chronic respiratory failure with hypoxia: Secondary | ICD-10-CM | POA: Diagnosis not present

## 2024-01-23 DIAGNOSIS — I4819 Other persistent atrial fibrillation: Secondary | ICD-10-CM | POA: Diagnosis not present

## 2024-01-23 DIAGNOSIS — F32A Depression, unspecified: Secondary | ICD-10-CM | POA: Diagnosis not present

## 2024-01-23 DIAGNOSIS — I10 Essential (primary) hypertension: Secondary | ICD-10-CM | POA: Diagnosis not present

## 2024-01-23 DIAGNOSIS — I5032 Chronic diastolic (congestive) heart failure: Secondary | ICD-10-CM | POA: Diagnosis not present

## 2024-01-23 DIAGNOSIS — F419 Anxiety disorder, unspecified: Secondary | ICD-10-CM | POA: Diagnosis not present

## 2024-01-23 DIAGNOSIS — K219 Gastro-esophageal reflux disease without esophagitis: Secondary | ICD-10-CM | POA: Diagnosis not present

## 2024-01-23 DIAGNOSIS — D509 Iron deficiency anemia, unspecified: Secondary | ICD-10-CM | POA: Diagnosis not present

## 2024-01-23 DIAGNOSIS — J441 Chronic obstructive pulmonary disease with (acute) exacerbation: Secondary | ICD-10-CM | POA: Diagnosis not present

## 2024-01-23 DIAGNOSIS — Z9181 History of falling: Secondary | ICD-10-CM | POA: Diagnosis not present

## 2024-01-23 DIAGNOSIS — Z9981 Dependence on supplemental oxygen: Secondary | ICD-10-CM | POA: Diagnosis not present

## 2024-01-23 DIAGNOSIS — R4189 Other symptoms and signs involving cognitive functions and awareness: Secondary | ICD-10-CM | POA: Diagnosis not present

## 2024-01-23 DIAGNOSIS — F331 Major depressive disorder, recurrent, moderate: Secondary | ICD-10-CM | POA: Diagnosis not present

## 2024-01-23 DIAGNOSIS — R0602 Shortness of breath: Secondary | ICD-10-CM | POA: Diagnosis not present

## 2024-01-23 NOTE — Telephone Encounter (Signed)
 Noted to schedule appt with [facility] for first of Dec., obtain DC summary from [facility] for Dr. Shaaron to review in media

## 2024-01-27 DIAGNOSIS — I509 Heart failure, unspecified: Secondary | ICD-10-CM | POA: Diagnosis not present

## 2024-01-27 DIAGNOSIS — J9621 Acute and chronic respiratory failure with hypoxia: Secondary | ICD-10-CM | POA: Diagnosis not present

## 2024-01-27 DIAGNOSIS — Z853 Personal history of malignant neoplasm of breast: Secondary | ICD-10-CM | POA: Diagnosis not present

## 2024-01-27 DIAGNOSIS — R918 Other nonspecific abnormal finding of lung field: Secondary | ICD-10-CM | POA: Diagnosis not present

## 2024-01-27 DIAGNOSIS — R079 Chest pain, unspecified: Secondary | ICD-10-CM | POA: Diagnosis not present

## 2024-01-27 DIAGNOSIS — Z79899 Other long term (current) drug therapy: Secondary | ICD-10-CM | POA: Diagnosis not present

## 2024-01-27 DIAGNOSIS — J441 Chronic obstructive pulmonary disease with (acute) exacerbation: Secondary | ICD-10-CM | POA: Diagnosis not present

## 2024-01-27 DIAGNOSIS — R0602 Shortness of breath: Secondary | ICD-10-CM | POA: Diagnosis not present

## 2024-01-28 ENCOUNTER — Ambulatory Visit (HOSPITAL_COMMUNITY): Admission: RE | Admit: 2024-01-28 | Source: Home / Self Care | Admitting: Internal Medicine

## 2024-01-28 ENCOUNTER — Encounter (HOSPITAL_COMMUNITY): Admission: RE | Payer: Self-pay | Source: Home / Self Care

## 2024-01-28 DIAGNOSIS — J440 Chronic obstructive pulmonary disease with acute lower respiratory infection: Secondary | ICD-10-CM | POA: Diagnosis not present

## 2024-01-28 DIAGNOSIS — Z79899 Other long term (current) drug therapy: Secondary | ICD-10-CM | POA: Diagnosis not present

## 2024-01-28 DIAGNOSIS — Z888 Allergy status to other drugs, medicaments and biological substances status: Secondary | ICD-10-CM | POA: Diagnosis not present

## 2024-01-28 DIAGNOSIS — I4811 Longstanding persistent atrial fibrillation: Secondary | ICD-10-CM | POA: Diagnosis not present

## 2024-01-28 DIAGNOSIS — I130 Hypertensive heart and chronic kidney disease with heart failure and stage 1 through stage 4 chronic kidney disease, or unspecified chronic kidney disease: Secondary | ICD-10-CM | POA: Diagnosis not present

## 2024-01-28 DIAGNOSIS — J9811 Atelectasis: Secondary | ICD-10-CM | POA: Diagnosis not present

## 2024-01-28 DIAGNOSIS — E785 Hyperlipidemia, unspecified: Secondary | ICD-10-CM | POA: Diagnosis not present

## 2024-01-28 DIAGNOSIS — F331 Major depressive disorder, recurrent, moderate: Secondary | ICD-10-CM | POA: Diagnosis not present

## 2024-01-28 DIAGNOSIS — I2583 Coronary atherosclerosis due to lipid rich plaque: Secondary | ICD-10-CM | POA: Diagnosis not present

## 2024-01-28 DIAGNOSIS — E782 Mixed hyperlipidemia: Secondary | ICD-10-CM | POA: Diagnosis not present

## 2024-01-28 DIAGNOSIS — I428 Other cardiomyopathies: Secondary | ICD-10-CM | POA: Diagnosis not present

## 2024-01-28 DIAGNOSIS — J441 Chronic obstructive pulmonary disease with (acute) exacerbation: Secondary | ICD-10-CM | POA: Diagnosis not present

## 2024-01-28 DIAGNOSIS — R0602 Shortness of breath: Secondary | ICD-10-CM | POA: Diagnosis not present

## 2024-01-28 DIAGNOSIS — Z792 Long term (current) use of antibiotics: Secondary | ICD-10-CM | POA: Diagnosis not present

## 2024-01-28 DIAGNOSIS — M1990 Unspecified osteoarthritis, unspecified site: Secondary | ICD-10-CM | POA: Diagnosis not present

## 2024-01-28 DIAGNOSIS — D649 Anemia, unspecified: Secondary | ICD-10-CM | POA: Diagnosis not present

## 2024-01-28 DIAGNOSIS — R918 Other nonspecific abnormal finding of lung field: Secondary | ICD-10-CM | POA: Diagnosis not present

## 2024-01-28 DIAGNOSIS — E039 Hypothyroidism, unspecified: Secondary | ICD-10-CM | POA: Diagnosis not present

## 2024-01-28 DIAGNOSIS — F32A Depression, unspecified: Secondary | ICD-10-CM | POA: Diagnosis not present

## 2024-01-28 DIAGNOSIS — I509 Heart failure, unspecified: Secondary | ICD-10-CM | POA: Diagnosis not present

## 2024-01-28 DIAGNOSIS — I2510 Atherosclerotic heart disease of native coronary artery without angina pectoris: Secondary | ICD-10-CM | POA: Diagnosis not present

## 2024-01-28 DIAGNOSIS — R590 Localized enlarged lymph nodes: Secondary | ICD-10-CM | POA: Diagnosis not present

## 2024-01-28 DIAGNOSIS — I5033 Acute on chronic diastolic (congestive) heart failure: Secondary | ICD-10-CM | POA: Diagnosis not present

## 2024-01-28 DIAGNOSIS — Z7901 Long term (current) use of anticoagulants: Secondary | ICD-10-CM | POA: Diagnosis not present

## 2024-01-28 DIAGNOSIS — N1832 Chronic kidney disease, stage 3b: Secondary | ICD-10-CM | POA: Diagnosis not present

## 2024-01-28 DIAGNOSIS — I10 Essential (primary) hypertension: Secondary | ICD-10-CM | POA: Diagnosis not present

## 2024-01-28 DIAGNOSIS — R7989 Other specified abnormal findings of blood chemistry: Secondary | ICD-10-CM | POA: Diagnosis not present

## 2024-01-28 DIAGNOSIS — Z885 Allergy status to narcotic agent status: Secondary | ICD-10-CM | POA: Diagnosis not present

## 2024-01-28 DIAGNOSIS — Z8673 Personal history of transient ischemic attack (TIA), and cerebral infarction without residual deficits: Secondary | ICD-10-CM | POA: Diagnosis not present

## 2024-01-28 DIAGNOSIS — I480 Paroxysmal atrial fibrillation: Secondary | ICD-10-CM | POA: Diagnosis not present

## 2024-01-28 DIAGNOSIS — J449 Chronic obstructive pulmonary disease, unspecified: Secondary | ICD-10-CM | POA: Diagnosis not present

## 2024-01-28 DIAGNOSIS — Z880 Allergy status to penicillin: Secondary | ICD-10-CM | POA: Diagnosis not present

## 2024-01-28 DIAGNOSIS — R296 Repeated falls: Secondary | ICD-10-CM | POA: Diagnosis not present

## 2024-01-28 DIAGNOSIS — I341 Nonrheumatic mitral (valve) prolapse: Secondary | ICD-10-CM | POA: Diagnosis not present

## 2024-01-28 DIAGNOSIS — I214 Non-ST elevation (NSTEMI) myocardial infarction: Secondary | ICD-10-CM | POA: Diagnosis not present

## 2024-01-28 DIAGNOSIS — J9611 Chronic respiratory failure with hypoxia: Secondary | ICD-10-CM | POA: Diagnosis not present

## 2024-01-28 DIAGNOSIS — J90 Pleural effusion, not elsewhere classified: Secondary | ICD-10-CM | POA: Diagnosis not present

## 2024-01-28 DIAGNOSIS — J9621 Acute and chronic respiratory failure with hypoxia: Secondary | ICD-10-CM | POA: Diagnosis not present

## 2024-01-28 DIAGNOSIS — I083 Combined rheumatic disorders of mitral, aortic and tricuspid valves: Secondary | ICD-10-CM | POA: Diagnosis not present

## 2024-01-28 DIAGNOSIS — Z882 Allergy status to sulfonamides status: Secondary | ICD-10-CM | POA: Diagnosis not present

## 2024-01-28 DIAGNOSIS — I429 Cardiomyopathy, unspecified: Secondary | ICD-10-CM | POA: Diagnosis not present

## 2024-01-28 DIAGNOSIS — I452 Bifascicular block: Secondary | ICD-10-CM | POA: Diagnosis not present

## 2024-01-28 DIAGNOSIS — Z853 Personal history of malignant neoplasm of breast: Secondary | ICD-10-CM | POA: Diagnosis not present

## 2024-01-28 DIAGNOSIS — F419 Anxiety disorder, unspecified: Secondary | ICD-10-CM | POA: Diagnosis not present

## 2024-01-28 DIAGNOSIS — I2584 Coronary atherosclerosis due to calcified coronary lesion: Secondary | ICD-10-CM | POA: Diagnosis not present

## 2024-01-28 DIAGNOSIS — I110 Hypertensive heart disease with heart failure: Secondary | ICD-10-CM | POA: Diagnosis not present

## 2024-01-28 DIAGNOSIS — I5021 Acute systolic (congestive) heart failure: Secondary | ICD-10-CM | POA: Diagnosis not present

## 2024-01-28 DIAGNOSIS — J189 Pneumonia, unspecified organism: Secondary | ICD-10-CM | POA: Diagnosis not present

## 2024-01-28 SURGERY — EGD (ESOPHAGOGASTRODUODENOSCOPY)
Anesthesia: Choice

## 2024-02-09 ENCOUNTER — Ambulatory Visit: Admitting: Cardiology

## 2024-02-11 DIAGNOSIS — J449 Chronic obstructive pulmonary disease, unspecified: Secondary | ICD-10-CM | POA: Diagnosis not present

## 2024-02-11 DIAGNOSIS — R918 Other nonspecific abnormal finding of lung field: Secondary | ICD-10-CM | POA: Diagnosis not present

## 2024-02-11 DIAGNOSIS — F330 Major depressive disorder, recurrent, mild: Secondary | ICD-10-CM | POA: Diagnosis not present

## 2024-02-11 DIAGNOSIS — J9611 Chronic respiratory failure with hypoxia: Secondary | ICD-10-CM | POA: Diagnosis not present

## 2024-02-18 DIAGNOSIS — F330 Major depressive disorder, recurrent, mild: Secondary | ICD-10-CM | POA: Diagnosis not present

## 2024-02-19 ENCOUNTER — Other Ambulatory Visit (HOSPITAL_COMMUNITY): Payer: Self-pay | Admitting: Pulmonary Disease

## 2024-02-19 DIAGNOSIS — R918 Other nonspecific abnormal finding of lung field: Secondary | ICD-10-CM

## 2024-02-19 DIAGNOSIS — J449 Chronic obstructive pulmonary disease, unspecified: Secondary | ICD-10-CM | POA: Diagnosis not present

## 2024-02-20 DIAGNOSIS — R198 Other specified symptoms and signs involving the digestive system and abdomen: Secondary | ICD-10-CM | POA: Diagnosis not present

## 2024-02-25 DIAGNOSIS — L110 Acquired keratosis follicularis: Secondary | ICD-10-CM | POA: Diagnosis not present

## 2024-02-25 DIAGNOSIS — I739 Peripheral vascular disease, unspecified: Secondary | ICD-10-CM | POA: Diagnosis not present

## 2024-02-25 DIAGNOSIS — F330 Major depressive disorder, recurrent, mild: Secondary | ICD-10-CM | POA: Diagnosis not present

## 2024-02-25 DIAGNOSIS — M79672 Pain in left foot: Secondary | ICD-10-CM | POA: Diagnosis not present

## 2024-02-25 DIAGNOSIS — M79671 Pain in right foot: Secondary | ICD-10-CM | POA: Diagnosis not present

## 2024-02-27 ENCOUNTER — Ambulatory Visit: Admitting: Internal Medicine

## 2024-03-08 DIAGNOSIS — J449 Chronic obstructive pulmonary disease, unspecified: Secondary | ICD-10-CM | POA: Diagnosis not present

## 2024-03-10 ENCOUNTER — Telehealth: Payer: Self-pay | Admitting: Physical Medicine and Rehabilitation

## 2024-03-10 ENCOUNTER — Ambulatory Visit (HOSPITAL_COMMUNITY)
Admission: RE | Admit: 2024-03-10 | Discharge: 2024-03-10 | Disposition: A | Discharge status: Home / Self Care | Source: Ambulatory Visit | Attending: Pulmonary Disease | Admitting: Pulmonary Disease

## 2024-03-10 DIAGNOSIS — R918 Other nonspecific abnormal finding of lung field: Secondary | ICD-10-CM | POA: Insufficient documentation

## 2024-03-10 DIAGNOSIS — M25552 Pain in left hip: Secondary | ICD-10-CM

## 2024-03-10 NOTE — Telephone Encounter (Signed)
 Pt called saying that they want an apt for a cortisone shot in her left hip.Call back number is [phone].

## 2024-03-23 ENCOUNTER — Ambulatory Visit: Admitting: Internal Medicine

## 2024-03-23 ENCOUNTER — Ambulatory Visit: Admitting: Physical Medicine and Rehabilitation

## 2024-03-23 ENCOUNTER — Other Ambulatory Visit: Payer: Self-pay

## 2024-03-23 ENCOUNTER — Telehealth: Payer: Self-pay | Admitting: Internal Medicine

## 2024-03-23 DIAGNOSIS — M1612 Unilateral primary osteoarthritis, left hip: Secondary | ICD-10-CM

## 2024-03-23 DIAGNOSIS — M25552 Pain in left hip: Secondary | ICD-10-CM

## 2024-03-23 NOTE — Progress Notes (Signed)
 Pain Scale   Average Pain 6 Patient advising she has chronic left hip pain that is constant        +Driver, -BT, -Dye Allergies.

## 2024-03-23 NOTE — Telephone Encounter (Signed)
 ERROR

## 2024-03-23 NOTE — Progress Notes (Signed)
 "  Amanda Palmer - 89 y.o. female MRN [id number]  Date of birth: 12/01/1933  Office Visit Note: Visit Date: 03/23/2024 PCP: Shona Norleen PEDLAR, MD Referred by: Shona Norleen PEDLAR, MD  Subjective: Chief Complaint  Patient presents with   Left Hip - Pain   HPI:  Amanda Palmer is a 89 y.o. female who comes in today for planned repeat Left anesthetic hip arthrogram with fluoroscopic guidance.  The patient has failed conservative care including home exercise, medications, time and activity modification. Prior injection gave more than 50% relief for several months. This injection will be diagnostic and hopefully therapeutic.  Please see requesting physician notes for further details and justification.  Referring: Duwaine Pouch, FNP   ROS Otherwise per HPI.  Assessment & Plan: Visit Diagnoses:    ICD-10-CM   1. Pain in left hip  M25.552 Large Joint Inj: L hip joint    XR C-ARM NO REPORT    Ambulatory referral to Orthopedic Surgery    2. Arthritis of left hip  M16.12 Large Joint Inj: L hip joint    Ambulatory referral to Orthopedic Surgery      Plan: No additional findings.   Meds & Orders: No orders of the defined types were placed in this encounter.   Orders Placed This Encounter  Procedures   Large Joint Inj: L hip joint   XR C-ARM NO REPORT   Ambulatory referral to Orthopedic Surgery    Follow-up: Return for visit to requesting provider as needed.   Procedures: Large Joint Inj: L hip joint on 03/23/2024 1:33 PM Indications: diagnostic evaluation and pain Details: 22 G 3.5 in needle, fluoroscopy-guided anterior approach  Arthrogram: No  Medications: 4 mL bupivacaine  0.25 %; 40 mg triamcinolone  acetonide 40 MG/ML Outcome: tolerated well, no immediate complications  There was excellent flow of contrast producing a partial arthrogram of the hip. The patient did have relief of symptoms during the anesthetic phase of the injection. Procedure, treatment alternatives, risks and  benefits explained, specific risks discussed. Consent was given by the patient. Immediately prior to procedure a time out was called to verify the correct patient, procedure, equipment, support staff and site/side marked as required. Patient was prepped and draped in the usual sterile fashion.          Clinical History: CLINICAL DATA:  Back pain, left hip pain   EXAM: MRI LUMBAR SPINE WITHOUT CONTRAST   TECHNIQUE: Multiplanar, multisequence MR imaging of the lumbar spine was performed. No intravenous contrast was administered.   COMPARISON:  January 2022   FINDINGS: Bone marrow: There are Modic type 2 changes at L1-2 and L2-3   Conus and cauda equina: No significant abnormality   Paraspinal tissues: No significant abnormality   L1-L2: There is severe degenerative disc disease. Mild facet arthropathy. There is no spinal stenosis or foraminal stenosis   L2-L3: There is severe degenerative disc disease. Mild facet arthropathy. There is no spinal stenosis or significant foraminal stenosis   L3-L4: There is severe degenerative disc disease. Mild facet arthropathy. There is no spinal stenosis. There is mild bilateral neural foraminal stenosis   L4-L5: Previous PLIF with posterolateral fusion. No spinal stenosis or foraminal stenosis   L5-S1: There is severe degenerative disc disease with a disc bulge that extends laterally on both sides. There is severe bilateral facet arthropathy. There is severe right and moderate left neural foraminal stenosis.   IMPRESSION: 1. Previous PLIF with posterolateral fusion at L4-5 and L5-S1 2. There is severe right  neural foraminal stenosis and moderate left neural foraminal stenosis at L5-S1 due to severe degenerative disc disease and facet arthropathy. This has worsened since the prior study from March 18, 2020 3. Severe degenerative disc disease at L1-2, L2-3 and L3-4 is unchanged     Electronically Signed   By: Nancyann Burns M.D.    On: 10/23/2023 11:10     Objective:  VS:  HT:    WT:   BMI:     BP:   HR: bpm  TEMP: ( )  RESP:  Physical Exam   Imaging: No results found. "

## 2024-04-04 MED ORDER — BUPIVACAINE HCL 0.25 % IJ SOLN
4.0000 mL | INTRAMUSCULAR | Status: AC | PRN
  Administered 2024-03-23: 4 mL via INTRA_ARTICULAR

## 2024-04-04 MED ORDER — TRIAMCINOLONE ACETONIDE 40 MG/ML IJ SUSP
40.0000 mg | INTRAMUSCULAR | Status: AC | PRN
  Administered 2024-03-23: 40 mg via INTRA_ARTICULAR

## 2024-04-13 ENCOUNTER — Ambulatory Visit: Admitting: Cardiology

## 2024-04-14 ENCOUNTER — Encounter: Admitting: Orthopaedic Surgery

## 2024-04-20 ENCOUNTER — Ambulatory Visit: Admitting: Internal Medicine

## 2024-05-13 ENCOUNTER — Ambulatory Visit (HOSPITAL_COMMUNITY): Admitting: Internal Medicine

## 2024-07-06 ENCOUNTER — Ambulatory Visit: Admitting: Cardiology
